# Patient Record
Sex: Female | Born: 1940 | Race: White | Hispanic: No
Health system: Midwestern US, Community
[De-identification: ages and names within clinical notes are randomized; demographics above are authoritative.]

## PROBLEM LIST (undated history)

## (undated) DIAGNOSIS — I10 Essential (primary) hypertension: Secondary | ICD-10-CM

## (undated) DIAGNOSIS — Z1231 Encounter for screening mammogram for malignant neoplasm of breast: Secondary | ICD-10-CM

## (undated) DIAGNOSIS — M5126 Other intervertebral disc displacement, lumbar region: Secondary | ICD-10-CM

## (undated) DIAGNOSIS — M797 Fibromyalgia: Secondary | ICD-10-CM

## (undated) DIAGNOSIS — R4781 Slurred speech: Secondary | ICD-10-CM

## (undated) DIAGNOSIS — M4802 Spinal stenosis, cervical region: Secondary | ICD-10-CM

## (undated) DIAGNOSIS — M5412 Radiculopathy, cervical region: Secondary | ICD-10-CM

## (undated) DIAGNOSIS — J45909 Unspecified asthma, uncomplicated: Secondary | ICD-10-CM

## (undated) DIAGNOSIS — D509 Iron deficiency anemia, unspecified: Secondary | ICD-10-CM

## (undated) DIAGNOSIS — G629 Polyneuropathy, unspecified: Secondary | ICD-10-CM

## (undated) DIAGNOSIS — N83209 Unspecified ovarian cyst, unspecified side: Secondary | ICD-10-CM

## (undated) DIAGNOSIS — M8580 Other specified disorders of bone density and structure, unspecified site: Secondary | ICD-10-CM

## (undated) DIAGNOSIS — E785 Hyperlipidemia, unspecified: Secondary | ICD-10-CM

## (undated) DIAGNOSIS — K219 Gastro-esophageal reflux disease without esophagitis: Secondary | ICD-10-CM

## (undated) DIAGNOSIS — L97925 Non-pressure chronic ulcer of unspecified part of left lower leg with muscle involvement without evidence of necrosis: Secondary | ICD-10-CM

## (undated) DIAGNOSIS — E1140 Type 2 diabetes mellitus with diabetic neuropathy, unspecified: Principal | ICD-10-CM

## (undated) DIAGNOSIS — E119 Type 2 diabetes mellitus without complications: Principal | ICD-10-CM

## (undated) DIAGNOSIS — L97922 Non-pressure chronic ulcer of unspecified part of left lower leg with fat layer exposed: Principal | ICD-10-CM

## (undated) DIAGNOSIS — K2100 Gastro-esophageal reflux disease with esophagitis, without bleeding: Principal | ICD-10-CM

## (undated) DIAGNOSIS — J9600 Acute respiratory failure, unspecified whether with hypoxia or hypercapnia: Principal | ICD-10-CM

## (undated) DIAGNOSIS — E1149 Type 2 diabetes mellitus with other diabetic neurological complication: Principal | ICD-10-CM

## (undated) HISTORY — DX: Iron deficiency anemia, unspecified: D50.9

## (undated) HISTORY — DX: Polyneuropathy, unspecified: G62.9

## (undated) HISTORY — DX: Hyperlipidemia, unspecified: E78.5

## (undated) HISTORY — DX: Unspecified ovarian cyst, unspecified side: N83.209

## (undated) HISTORY — PX: VAGINAL HYSTERECTOMY: SUR661

## (undated) HISTORY — DX: Essential (primary) hypertension: I10

## (undated) HISTORY — DX: Gastro-esophageal reflux disease without esophagitis: K21.9

## (undated) HISTORY — DX: Unspecified asthma, uncomplicated: J45.909

## (undated) HISTORY — DX: Other specified disorders of bone density and structure, unspecified site: M85.80

---

## 2003-04-07 ENCOUNTER — Emergency Department (HOSPITAL_COMMUNITY): Admission: EM | Admit: 2003-04-07 | Discharge: 2003-04-07 | Payer: Self-pay | Admitting: Emergency Medicine

## 2003-04-07 ENCOUNTER — Encounter: Payer: Self-pay | Admitting: Emergency Medicine

## 2003-07-16 ENCOUNTER — Encounter: Payer: Self-pay | Admitting: Family Medicine

## 2003-07-16 ENCOUNTER — Encounter: Admission: RE | Admit: 2003-07-16 | Discharge: 2003-07-16 | Payer: Self-pay | Admitting: Internal Medicine

## 2003-07-16 ENCOUNTER — Encounter: Payer: Self-pay | Admitting: Internal Medicine

## 2003-10-10 ENCOUNTER — Encounter: Payer: Self-pay | Admitting: Internal Medicine

## 2003-10-10 ENCOUNTER — Encounter: Payer: Self-pay | Admitting: Family Medicine

## 2003-10-10 ENCOUNTER — Encounter: Admission: RE | Admit: 2003-10-10 | Discharge: 2003-10-10 | Payer: Self-pay | Admitting: Internal Medicine

## 2003-11-27 ENCOUNTER — Encounter
Admission: RE | Admit: 2003-11-27 | Discharge: 2004-02-25 | Payer: Self-pay | Admitting: Physical Medicine and Rehabilitation

## 2003-12-12 ENCOUNTER — Ambulatory Visit (HOSPITAL_COMMUNITY)
Admission: RE | Admit: 2003-12-12 | Discharge: 2003-12-12 | Payer: Self-pay | Admitting: Physical Medicine and Rehabilitation

## 2003-12-25 ENCOUNTER — Other Ambulatory Visit: Admission: RE | Admit: 2003-12-25 | Discharge: 2003-12-25 | Payer: Self-pay | Admitting: Obstetrics and Gynecology

## 2004-03-04 ENCOUNTER — Encounter
Admission: RE | Admit: 2004-03-04 | Discharge: 2004-06-02 | Payer: Self-pay | Admitting: Physical Medicine and Rehabilitation

## 2004-03-13 ENCOUNTER — Encounter
Admission: RE | Admit: 2004-03-13 | Discharge: 2004-03-13 | Payer: Self-pay | Admitting: Physical Medicine and Rehabilitation

## 2004-03-13 ENCOUNTER — Encounter: Payer: Self-pay | Admitting: Family Medicine

## 2004-06-12 ENCOUNTER — Encounter: Admission: RE | Admit: 2004-06-12 | Discharge: 2004-06-12 | Payer: Self-pay | Admitting: Family Medicine

## 2004-07-08 ENCOUNTER — Encounter: Admission: RE | Admit: 2004-07-08 | Discharge: 2004-07-08 | Payer: Self-pay | Admitting: Family Medicine

## 2004-07-09 ENCOUNTER — Emergency Department (HOSPITAL_COMMUNITY): Admission: EM | Admit: 2004-07-09 | Discharge: 2004-07-09 | Payer: Self-pay | Admitting: Emergency Medicine

## 2004-07-15 ENCOUNTER — Encounter: Admission: RE | Admit: 2004-07-15 | Discharge: 2004-08-22 | Payer: Self-pay | Admitting: Family Medicine

## 2004-07-17 ENCOUNTER — Encounter: Admission: RE | Admit: 2004-07-17 | Discharge: 2004-07-17 | Payer: Self-pay | Admitting: Family Medicine

## 2004-11-08 ENCOUNTER — Emergency Department (HOSPITAL_COMMUNITY): Admission: EM | Admit: 2004-11-08 | Discharge: 2004-11-08 | Payer: Self-pay | Admitting: Emergency Medicine

## 2004-11-11 ENCOUNTER — Ambulatory Visit: Payer: Self-pay | Admitting: Internal Medicine

## 2004-11-13 ENCOUNTER — Ambulatory Visit: Payer: Self-pay | Admitting: Family Medicine

## 2004-11-14 ENCOUNTER — Ambulatory Visit: Payer: Self-pay | Admitting: Internal Medicine

## 2004-11-29 ENCOUNTER — Encounter: Admission: RE | Admit: 2004-11-29 | Discharge: 2004-11-29 | Payer: Self-pay | Admitting: Internal Medicine

## 2004-11-29 ENCOUNTER — Ambulatory Visit: Payer: Self-pay | Admitting: Internal Medicine

## 2004-12-06 ENCOUNTER — Ambulatory Visit: Payer: Self-pay | Admitting: Family Medicine

## 2004-12-10 ENCOUNTER — Ambulatory Visit: Payer: Self-pay | Admitting: Family Medicine

## 2005-01-15 ENCOUNTER — Encounter: Admission: RE | Admit: 2005-01-15 | Discharge: 2005-01-15 | Payer: Self-pay | Admitting: Obstetrics and Gynecology

## 2005-01-31 ENCOUNTER — Ambulatory Visit: Payer: Self-pay | Admitting: Family Medicine

## 2005-02-12 ENCOUNTER — Encounter (HOSPITAL_BASED_OUTPATIENT_CLINIC_OR_DEPARTMENT_OTHER): Admission: RE | Admit: 2005-02-12 | Discharge: 2005-03-19 | Payer: Self-pay | Admitting: Internal Medicine

## 2005-02-18 ENCOUNTER — Ambulatory Visit: Payer: Self-pay | Admitting: Family Medicine

## 2005-04-16 ENCOUNTER — Ambulatory Visit: Payer: Self-pay | Admitting: Family Medicine

## 2005-05-08 ENCOUNTER — Ambulatory Visit: Payer: Self-pay | Admitting: Family Medicine

## 2005-07-04 ENCOUNTER — Ambulatory Visit: Payer: Self-pay | Admitting: Family Medicine

## 2005-08-27 ENCOUNTER — Emergency Department (HOSPITAL_COMMUNITY): Admission: EM | Admit: 2005-08-27 | Discharge: 2005-08-27 | Payer: Self-pay | Admitting: Emergency Medicine

## 2005-09-05 ENCOUNTER — Ambulatory Visit: Payer: Self-pay | Admitting: Family Medicine

## 2005-09-11 ENCOUNTER — Ambulatory Visit: Payer: Self-pay | Admitting: Family Medicine

## 2005-09-15 ENCOUNTER — Ambulatory Visit: Payer: Self-pay | Admitting: Family Medicine

## 2005-09-19 ENCOUNTER — Ambulatory Visit: Payer: Self-pay | Admitting: Family Medicine

## 2005-09-25 ENCOUNTER — Ambulatory Visit: Payer: Self-pay | Admitting: Cardiology

## 2005-10-06 ENCOUNTER — Ambulatory Visit: Payer: Self-pay | Admitting: Gastroenterology

## 2005-10-14 ENCOUNTER — Ambulatory Visit: Payer: Self-pay | Admitting: Gastroenterology

## 2005-10-14 ENCOUNTER — Ambulatory Visit (HOSPITAL_COMMUNITY): Admission: RE | Admit: 2005-10-14 | Discharge: 2005-10-14 | Payer: Self-pay | Admitting: Gastroenterology

## 2005-10-23 ENCOUNTER — Ambulatory Visit: Payer: Self-pay | Admitting: Family Medicine

## 2005-11-03 ENCOUNTER — Ambulatory Visit: Payer: Self-pay | Admitting: Family Medicine

## 2005-11-10 ENCOUNTER — Ambulatory Visit: Payer: Self-pay | Admitting: Gastroenterology

## 2005-11-13 ENCOUNTER — Ambulatory Visit: Payer: Self-pay | Admitting: Family Medicine

## 2005-11-24 ENCOUNTER — Ambulatory Visit: Payer: Self-pay | Admitting: Family Medicine

## 2005-11-25 ENCOUNTER — Encounter: Admission: RE | Admit: 2005-11-25 | Discharge: 2005-11-25 | Payer: Self-pay | Admitting: Family Medicine

## 2005-11-27 ENCOUNTER — Encounter: Admission: RE | Admit: 2005-11-27 | Discharge: 2006-01-06 | Payer: Self-pay | Admitting: Family Medicine

## 2005-12-02 ENCOUNTER — Ambulatory Visit: Payer: Self-pay | Admitting: Gastroenterology

## 2005-12-02 ENCOUNTER — Ambulatory Visit: Payer: Self-pay | Admitting: Family Medicine

## 2006-01-07 ENCOUNTER — Encounter: Admission: RE | Admit: 2006-01-07 | Discharge: 2006-03-05 | Payer: Self-pay | Admitting: Family Medicine

## 2006-01-16 ENCOUNTER — Encounter: Admission: RE | Admit: 2006-01-16 | Discharge: 2006-01-16 | Payer: Self-pay | Admitting: Family Medicine

## 2006-01-22 ENCOUNTER — Ambulatory Visit: Payer: Self-pay | Admitting: Family Medicine

## 2006-01-30 ENCOUNTER — Other Ambulatory Visit: Admission: RE | Admit: 2006-01-30 | Discharge: 2006-01-30 | Payer: Self-pay | Admitting: Obstetrics and Gynecology

## 2006-06-29 ENCOUNTER — Ambulatory Visit: Payer: Self-pay | Admitting: Family Medicine

## 2006-07-16 ENCOUNTER — Ambulatory Visit: Payer: Self-pay | Admitting: Family Medicine

## 2006-09-29 ENCOUNTER — Ambulatory Visit: Payer: Self-pay | Admitting: Family Medicine

## 2006-09-30 ENCOUNTER — Ambulatory Visit: Payer: Self-pay | Admitting: Family Medicine

## 2006-10-12 ENCOUNTER — Ambulatory Visit: Payer: Self-pay | Admitting: Family Medicine

## 2006-10-14 ENCOUNTER — Encounter: Admission: RE | Admit: 2006-10-14 | Discharge: 2006-10-14 | Payer: Self-pay | Admitting: Orthopedic Surgery

## 2006-12-08 ENCOUNTER — Ambulatory Visit: Payer: Self-pay | Admitting: Family Medicine

## 2007-01-29 ENCOUNTER — Ambulatory Visit: Payer: Self-pay | Admitting: Family Medicine

## 2007-03-10 ENCOUNTER — Encounter: Admission: RE | Admit: 2007-03-10 | Discharge: 2007-03-10 | Payer: Self-pay | Admitting: Family Medicine

## 2007-03-23 ENCOUNTER — Encounter: Payer: Self-pay | Admitting: Family Medicine

## 2007-03-23 ENCOUNTER — Encounter
Admission: RE | Admit: 2007-03-23 | Discharge: 2007-03-23 | Payer: Self-pay | Admitting: Physical Medicine and Rehabilitation

## 2007-04-01 ENCOUNTER — Ambulatory Visit: Payer: Self-pay | Admitting: Family Medicine

## 2007-04-21 ENCOUNTER — Ambulatory Visit: Payer: Self-pay | Admitting: Family Medicine

## 2007-05-07 ENCOUNTER — Ambulatory Visit: Payer: Self-pay | Admitting: Family Medicine

## 2007-05-12 ENCOUNTER — Encounter: Payer: Self-pay | Admitting: Family Medicine

## 2007-05-13 ENCOUNTER — Ambulatory Visit: Payer: Self-pay | Admitting: Family Medicine

## 2007-05-13 LAB — CONVERTED CEMR LAB
ALT: 11 units/L (ref 0–40)
AST: 13 units/L (ref 0–37)
Alkaline Phosphatase: 56 units/L (ref 39–117)
Total Protein: 6.4 g/dL (ref 6.0–8.3)

## 2007-05-18 DIAGNOSIS — E785 Hyperlipidemia, unspecified: Secondary | ICD-10-CM

## 2007-05-18 DIAGNOSIS — Z8719 Personal history of other diseases of the digestive system: Secondary | ICD-10-CM

## 2007-05-18 DIAGNOSIS — J309 Allergic rhinitis, unspecified: Secondary | ICD-10-CM | POA: Insufficient documentation

## 2007-05-18 DIAGNOSIS — M949 Disorder of cartilage, unspecified: Secondary | ICD-10-CM

## 2007-05-18 DIAGNOSIS — E1140 Type 2 diabetes mellitus with diabetic neuropathy, unspecified: Secondary | ICD-10-CM | POA: Insufficient documentation

## 2007-05-18 DIAGNOSIS — Z9079 Acquired absence of other genital organ(s): Secondary | ICD-10-CM | POA: Insufficient documentation

## 2007-05-18 DIAGNOSIS — N750 Cyst of Bartholin's gland: Secondary | ICD-10-CM | POA: Insufficient documentation

## 2007-05-18 DIAGNOSIS — M899 Disorder of bone, unspecified: Secondary | ICD-10-CM | POA: Insufficient documentation

## 2007-05-18 DIAGNOSIS — I10 Essential (primary) hypertension: Secondary | ICD-10-CM | POA: Insufficient documentation

## 2007-05-26 ENCOUNTER — Encounter: Payer: Self-pay | Admitting: Family Medicine

## 2007-07-14 ENCOUNTER — Encounter: Payer: Self-pay | Admitting: Family Medicine

## 2007-08-23 ENCOUNTER — Encounter: Payer: Self-pay | Admitting: Family Medicine

## 2007-08-25 ENCOUNTER — Telehealth (INDEPENDENT_AMBULATORY_CARE_PROVIDER_SITE_OTHER): Payer: Self-pay | Admitting: *Deleted

## 2007-09-14 ENCOUNTER — Encounter: Payer: Self-pay | Admitting: Family Medicine

## 2007-09-15 ENCOUNTER — Encounter: Admission: RE | Admit: 2007-09-15 | Discharge: 2007-09-15 | Payer: Self-pay | Admitting: Orthopedic Surgery

## 2007-09-27 ENCOUNTER — Encounter: Payer: Self-pay | Admitting: Family Medicine

## 2007-09-28 ENCOUNTER — Telehealth: Payer: Self-pay | Admitting: Family Medicine

## 2007-10-11 ENCOUNTER — Telehealth (INDEPENDENT_AMBULATORY_CARE_PROVIDER_SITE_OTHER): Payer: Self-pay | Admitting: *Deleted

## 2007-10-12 ENCOUNTER — Ambulatory Visit: Payer: Self-pay | Admitting: Family Medicine

## 2007-10-12 DIAGNOSIS — R109 Unspecified abdominal pain: Secondary | ICD-10-CM | POA: Insufficient documentation

## 2007-10-13 ENCOUNTER — Encounter: Admission: RE | Admit: 2007-10-13 | Discharge: 2007-10-13 | Payer: Self-pay | Admitting: Family Medicine

## 2007-10-15 ENCOUNTER — Telehealth: Payer: Self-pay | Admitting: Family Medicine

## 2007-10-19 ENCOUNTER — Telehealth: Payer: Self-pay | Admitting: Family Medicine

## 2007-11-02 ENCOUNTER — Ambulatory Visit: Payer: Self-pay | Admitting: Gastroenterology

## 2007-11-02 LAB — CONVERTED CEMR LAB
AST: 16 units/L (ref 0–37)
Albumin: 3.6 g/dL (ref 3.5–5.2)
BUN: 10 mg/dL (ref 6–23)
GFR calc non Af Amer: 107 mL/min
Potassium: 4.1 meq/L (ref 3.5–5.1)

## 2007-11-03 ENCOUNTER — Ambulatory Visit: Payer: Self-pay | Admitting: Family Medicine

## 2007-11-04 ENCOUNTER — Encounter (INDEPENDENT_AMBULATORY_CARE_PROVIDER_SITE_OTHER): Payer: Self-pay | Admitting: *Deleted

## 2007-11-09 ENCOUNTER — Encounter: Payer: Self-pay | Admitting: Family Medicine

## 2007-11-10 ENCOUNTER — Ambulatory Visit: Payer: Self-pay | Admitting: Family Medicine

## 2007-11-15 ENCOUNTER — Telehealth: Payer: Self-pay | Admitting: Family Medicine

## 2007-11-30 ENCOUNTER — Telehealth: Payer: Self-pay | Admitting: Family Medicine

## 2007-12-08 ENCOUNTER — Encounter: Payer: Self-pay | Admitting: Family Medicine

## 2007-12-27 ENCOUNTER — Telehealth (INDEPENDENT_AMBULATORY_CARE_PROVIDER_SITE_OTHER): Payer: Self-pay | Admitting: *Deleted

## 2007-12-29 ENCOUNTER — Telehealth (INDEPENDENT_AMBULATORY_CARE_PROVIDER_SITE_OTHER): Payer: Self-pay | Admitting: *Deleted

## 2008-01-04 ENCOUNTER — Telehealth (INDEPENDENT_AMBULATORY_CARE_PROVIDER_SITE_OTHER): Payer: Self-pay | Admitting: *Deleted

## 2008-01-04 DIAGNOSIS — M549 Dorsalgia, unspecified: Secondary | ICD-10-CM | POA: Insufficient documentation

## 2008-01-18 ENCOUNTER — Encounter: Payer: Self-pay | Admitting: Family Medicine

## 2008-01-25 ENCOUNTER — Telehealth: Payer: Self-pay | Admitting: Family Medicine

## 2008-01-25 ENCOUNTER — Ambulatory Visit: Payer: Self-pay | Admitting: Family Medicine

## 2008-01-25 DIAGNOSIS — D508 Other iron deficiency anemias: Secondary | ICD-10-CM

## 2008-01-27 ENCOUNTER — Telehealth (INDEPENDENT_AMBULATORY_CARE_PROVIDER_SITE_OTHER): Payer: Self-pay | Admitting: *Deleted

## 2008-01-28 ENCOUNTER — Telehealth (INDEPENDENT_AMBULATORY_CARE_PROVIDER_SITE_OTHER): Payer: Self-pay | Admitting: *Deleted

## 2008-02-01 DIAGNOSIS — D72819 Decreased white blood cell count, unspecified: Secondary | ICD-10-CM | POA: Insufficient documentation

## 2008-02-01 LAB — CONVERTED CEMR LAB
Basophils Absolute: 0 10*3/uL (ref 0.0–0.1)
Basophils Relative: 0.7 % (ref 0.0–1.0)
HCT: 36.4 % (ref 36.0–46.0)
Lymphocytes Relative: 69.2 % — ABNORMAL HIGH (ref 12.0–46.0)
MCV: 83.1 fL (ref 78.0–100.0)
Monocytes Absolute: 0.5 10*3/uL (ref 0.2–0.7)
Neutro Abs: 0.5 10*3/uL — ABNORMAL LOW (ref 1.4–7.7)
Platelets: 280 10*3/uL (ref 150–400)
RDW: 16.1 % — ABNORMAL HIGH (ref 11.5–14.6)

## 2008-02-07 ENCOUNTER — Ambulatory Visit: Payer: Self-pay | Admitting: Hematology and Oncology

## 2008-02-18 DIAGNOSIS — M129 Arthropathy, unspecified: Secondary | ICD-10-CM | POA: Insufficient documentation

## 2008-02-18 DIAGNOSIS — R519 Headache, unspecified: Secondary | ICD-10-CM | POA: Insufficient documentation

## 2008-02-18 DIAGNOSIS — F329 Major depressive disorder, single episode, unspecified: Secondary | ICD-10-CM

## 2008-02-18 DIAGNOSIS — J45909 Unspecified asthma, uncomplicated: Secondary | ICD-10-CM | POA: Insufficient documentation

## 2008-02-18 DIAGNOSIS — R5383 Other fatigue: Secondary | ICD-10-CM

## 2008-02-18 DIAGNOSIS — R599 Enlarged lymph nodes, unspecified: Secondary | ICD-10-CM | POA: Insufficient documentation

## 2008-02-18 DIAGNOSIS — K219 Gastro-esophageal reflux disease without esophagitis: Secondary | ICD-10-CM

## 2008-02-18 DIAGNOSIS — R51 Headache: Secondary | ICD-10-CM

## 2008-02-18 DIAGNOSIS — F3289 Other specified depressive episodes: Secondary | ICD-10-CM | POA: Insufficient documentation

## 2008-02-18 DIAGNOSIS — R609 Edema, unspecified: Secondary | ICD-10-CM

## 2008-02-18 DIAGNOSIS — R5381 Other malaise: Secondary | ICD-10-CM

## 2008-02-24 ENCOUNTER — Telehealth (INDEPENDENT_AMBULATORY_CARE_PROVIDER_SITE_OTHER): Payer: Self-pay | Admitting: *Deleted

## 2008-03-02 ENCOUNTER — Encounter: Payer: Self-pay | Admitting: Family Medicine

## 2008-03-03 ENCOUNTER — Ambulatory Visit: Payer: Self-pay | Admitting: Family Medicine

## 2008-03-03 DIAGNOSIS — D7289 Other specified disorders of white blood cells: Secondary | ICD-10-CM

## 2008-03-03 LAB — CONVERTED CEMR LAB: Uric Acid, Serum: 3.9 mg/dL (ref 2.4–7.0)

## 2008-03-06 ENCOUNTER — Telehealth (INDEPENDENT_AMBULATORY_CARE_PROVIDER_SITE_OTHER): Payer: Self-pay | Admitting: *Deleted

## 2008-03-06 LAB — CONVERTED CEMR LAB: LDH: 160 units/L (ref 94–250)

## 2008-03-09 ENCOUNTER — Telehealth: Payer: Self-pay | Admitting: Family Medicine

## 2008-03-10 ENCOUNTER — Encounter: Payer: Self-pay | Admitting: Family Medicine

## 2008-03-10 ENCOUNTER — Encounter: Payer: Self-pay | Admitting: Hematology and Oncology

## 2008-03-10 ENCOUNTER — Other Ambulatory Visit: Admission: RE | Admit: 2008-03-10 | Discharge: 2008-03-10 | Payer: Self-pay | Admitting: Hematology and Oncology

## 2008-03-10 LAB — URINALYSIS, MICROSCOPIC - CHCC
Bilirubin (Urine): NEGATIVE
Ketones: NEGATIVE mg/dL
Protein: NEGATIVE mg/dL
Specific Gravity, Urine: 1.01 (ref 1.003–1.035)
pH: 7 (ref 4.6–8.0)

## 2008-03-10 LAB — CBC WITH DIFFERENTIAL/PLATELET
BASO%: 0.1 % (ref 0.0–2.0)
Basophils Absolute: 0 10*3/uL (ref 0.0–0.1)
HCT: 37.6 % (ref 34.8–46.6)
HGB: 12.4 g/dL (ref 11.6–15.9)
LYMPH%: 51.4 % — ABNORMAL HIGH (ref 14.0–48.0)
MCHC: 32.8 g/dL (ref 32.0–36.0)
MONO#: 0.3 10*3/uL (ref 0.1–0.9)
NEUT%: 39.3 % — ABNORMAL LOW (ref 39.6–76.8)
Platelets: 296 10*3/uL (ref 145–400)
WBC: 4.3 10*3/uL (ref 3.9–10.0)
lymph#: 2.2 10*3/uL (ref 0.9–3.3)

## 2008-03-10 LAB — MORPHOLOGY: PLT EST: ADEQUATE

## 2008-03-10 LAB — COMPREHENSIVE METABOLIC PANEL
ALT: 15 U/L (ref 0–35)
AST: 18 U/L (ref 0–37)
Alkaline Phosphatase: 67 U/L (ref 39–117)
Creatinine, Ser: 0.69 mg/dL (ref 0.40–1.20)
Sodium: 141 mEq/L (ref 135–145)
Total Bilirubin: 0.3 mg/dL (ref 0.3–1.2)
Total Protein: 6.6 g/dL (ref 6.0–8.3)

## 2008-03-10 LAB — LACTATE DEHYDROGENASE: LDH: 190 U/L (ref 94–250)

## 2008-03-14 LAB — COMPREHENSIVE METABOLIC PANEL
ALT: 15 U/L (ref 0–35)
Albumin: 4 g/dL (ref 3.5–5.2)
Alkaline Phosphatase: 67 U/L (ref 39–117)
CO2: 26 mEq/L (ref 19–32)
Glucose, Bld: 94 mg/dL (ref 70–99)
Potassium: 4.7 mEq/L (ref 3.5–5.3)
Sodium: 140 mEq/L (ref 135–145)
Total Bilirubin: 0.2 mg/dL — ABNORMAL LOW (ref 0.3–1.2)
Total Protein: 6.3 g/dL (ref 6.0–8.3)

## 2008-03-14 LAB — PROTEIN ELECTROPHORESIS, SERUM, WITH REFLEX
Beta 2: 5.2 % (ref 3.2–6.5)
Beta Globulin: 6.3 % (ref 4.7–7.2)

## 2008-03-14 LAB — DIRECT ANTIGLOBULIN TEST (NOT AT ARMC)
DAT (Complement): NEGATIVE
DAT IgG: NEGATIVE

## 2008-03-14 LAB — HAPTOGLOBIN: Haptoglobin: 249 mg/dL — ABNORMAL HIGH (ref 16–200)

## 2008-03-14 LAB — FERRITIN: Ferritin: 34 ng/mL (ref 10–291)

## 2008-03-14 LAB — LACTATE DEHYDROGENASE: LDH: 182 U/L (ref 94–250)

## 2008-03-14 LAB — IRON AND TIBC: %SAT: 18 % — ABNORMAL LOW (ref 20–55)

## 2008-03-17 ENCOUNTER — Ambulatory Visit: Payer: Self-pay | Admitting: Hematology and Oncology

## 2008-03-21 ENCOUNTER — Encounter: Payer: Self-pay | Admitting: Family Medicine

## 2008-04-04 ENCOUNTER — Encounter (INDEPENDENT_AMBULATORY_CARE_PROVIDER_SITE_OTHER): Payer: Self-pay | Admitting: *Deleted

## 2008-04-06 ENCOUNTER — Ambulatory Visit: Payer: Self-pay | Admitting: Family Medicine

## 2008-04-06 ENCOUNTER — Telehealth (INDEPENDENT_AMBULATORY_CARE_PROVIDER_SITE_OTHER): Payer: Self-pay | Admitting: *Deleted

## 2008-04-10 ENCOUNTER — Telehealth (INDEPENDENT_AMBULATORY_CARE_PROVIDER_SITE_OTHER): Payer: Self-pay | Admitting: *Deleted

## 2008-04-12 ENCOUNTER — Encounter (INDEPENDENT_AMBULATORY_CARE_PROVIDER_SITE_OTHER): Payer: Self-pay | Admitting: *Deleted

## 2008-04-24 ENCOUNTER — Ambulatory Visit: Payer: Self-pay | Admitting: Internal Medicine

## 2008-04-24 DIAGNOSIS — M545 Low back pain: Secondary | ICD-10-CM

## 2008-04-24 DIAGNOSIS — G609 Hereditary and idiopathic neuropathy, unspecified: Secondary | ICD-10-CM | POA: Insufficient documentation

## 2008-04-26 ENCOUNTER — Encounter (INDEPENDENT_AMBULATORY_CARE_PROVIDER_SITE_OTHER): Payer: Self-pay | Admitting: *Deleted

## 2008-04-28 ENCOUNTER — Telehealth (INDEPENDENT_AMBULATORY_CARE_PROVIDER_SITE_OTHER): Payer: Self-pay | Admitting: *Deleted

## 2008-05-02 ENCOUNTER — Encounter: Payer: Self-pay | Admitting: Internal Medicine

## 2008-05-08 ENCOUNTER — Telehealth (INDEPENDENT_AMBULATORY_CARE_PROVIDER_SITE_OTHER): Payer: Self-pay | Admitting: *Deleted

## 2008-05-14 ENCOUNTER — Encounter: Payer: Self-pay | Admitting: Family Medicine

## 2008-05-14 ENCOUNTER — Encounter: Admission: RE | Admit: 2008-05-14 | Discharge: 2008-05-14 | Payer: Self-pay | Admitting: Orthopedic Surgery

## 2008-05-15 ENCOUNTER — Ambulatory Visit: Payer: Self-pay | Admitting: Internal Medicine

## 2008-05-15 DIAGNOSIS — R0989 Other specified symptoms and signs involving the circulatory and respiratory systems: Secondary | ICD-10-CM

## 2008-05-15 DIAGNOSIS — E559 Vitamin D deficiency, unspecified: Secondary | ICD-10-CM | POA: Insufficient documentation

## 2008-05-15 LAB — CONVERTED CEMR LAB
Basophils Absolute: 0 10*3/uL (ref 0.0–0.1)
Basophils Relative: 0.7 % (ref 0.0–1.0)
Eosinophils Absolute: 0.1 10*3/uL (ref 0.0–0.7)
Hemoglobin: 12.1 g/dL (ref 12.0–15.0)
Lymphocytes Relative: 45.8 % (ref 12.0–46.0)
MCHC: 32.5 g/dL (ref 30.0–36.0)
Monocytes Relative: 7.6 % (ref 3.0–12.0)
RBC: 4.38 M/uL (ref 3.87–5.11)
RDW: 13.8 % (ref 11.5–14.6)

## 2008-05-16 ENCOUNTER — Encounter (INDEPENDENT_AMBULATORY_CARE_PROVIDER_SITE_OTHER): Payer: Self-pay | Admitting: *Deleted

## 2008-05-16 ENCOUNTER — Encounter: Payer: Self-pay | Admitting: Internal Medicine

## 2008-05-18 ENCOUNTER — Encounter (INDEPENDENT_AMBULATORY_CARE_PROVIDER_SITE_OTHER): Payer: Self-pay | Admitting: *Deleted

## 2008-05-23 ENCOUNTER — Encounter: Payer: Self-pay | Admitting: Internal Medicine

## 2008-06-05 ENCOUNTER — Telehealth: Payer: Self-pay | Admitting: Gastroenterology

## 2008-06-15 ENCOUNTER — Telehealth (INDEPENDENT_AMBULATORY_CARE_PROVIDER_SITE_OTHER): Payer: Self-pay | Admitting: *Deleted

## 2008-06-21 ENCOUNTER — Encounter: Payer: Self-pay | Admitting: Family Medicine

## 2008-06-28 ENCOUNTER — Encounter: Payer: Self-pay | Admitting: Family Medicine

## 2008-07-05 ENCOUNTER — Telehealth (INDEPENDENT_AMBULATORY_CARE_PROVIDER_SITE_OTHER): Payer: Self-pay | Admitting: *Deleted

## 2008-07-20 ENCOUNTER — Telehealth (INDEPENDENT_AMBULATORY_CARE_PROVIDER_SITE_OTHER): Payer: Self-pay | Admitting: *Deleted

## 2008-07-27 ENCOUNTER — Ambulatory Visit: Payer: Self-pay | Admitting: Family Medicine

## 2008-07-27 ENCOUNTER — Telehealth (INDEPENDENT_AMBULATORY_CARE_PROVIDER_SITE_OTHER): Payer: Self-pay | Admitting: *Deleted

## 2008-08-22 ENCOUNTER — Encounter: Payer: Self-pay | Admitting: Family Medicine

## 2008-09-07 ENCOUNTER — Ambulatory Visit: Payer: Self-pay | Admitting: Family Medicine

## 2008-09-11 ENCOUNTER — Ambulatory Visit: Payer: Self-pay | Admitting: Family Medicine

## 2008-09-15 ENCOUNTER — Encounter (INDEPENDENT_AMBULATORY_CARE_PROVIDER_SITE_OTHER): Payer: Self-pay | Admitting: *Deleted

## 2008-09-21 ENCOUNTER — Ambulatory Visit: Payer: Self-pay | Admitting: Family Medicine

## 2008-09-22 ENCOUNTER — Encounter (INDEPENDENT_AMBULATORY_CARE_PROVIDER_SITE_OTHER): Payer: Self-pay | Admitting: *Deleted

## 2008-09-22 ENCOUNTER — Telehealth (INDEPENDENT_AMBULATORY_CARE_PROVIDER_SITE_OTHER): Payer: Self-pay | Admitting: *Deleted

## 2008-09-25 ENCOUNTER — Telehealth (INDEPENDENT_AMBULATORY_CARE_PROVIDER_SITE_OTHER): Payer: Self-pay | Admitting: *Deleted

## 2008-09-26 ENCOUNTER — Encounter: Payer: Self-pay | Admitting: Family Medicine

## 2008-09-26 ENCOUNTER — Telehealth (INDEPENDENT_AMBULATORY_CARE_PROVIDER_SITE_OTHER): Payer: Self-pay | Admitting: *Deleted

## 2008-10-03 ENCOUNTER — Telehealth (INDEPENDENT_AMBULATORY_CARE_PROVIDER_SITE_OTHER): Payer: Self-pay | Admitting: *Deleted

## 2008-10-13 ENCOUNTER — Encounter: Payer: Self-pay | Admitting: Family Medicine

## 2008-10-20 ENCOUNTER — Telehealth (INDEPENDENT_AMBULATORY_CARE_PROVIDER_SITE_OTHER): Payer: Self-pay | Admitting: *Deleted

## 2008-10-23 ENCOUNTER — Encounter: Payer: Self-pay | Admitting: Family Medicine

## 2008-10-23 ENCOUNTER — Encounter: Admission: RE | Admit: 2008-10-23 | Discharge: 2008-11-07 | Payer: Self-pay | Admitting: Family Medicine

## 2008-10-26 ENCOUNTER — Telehealth (INDEPENDENT_AMBULATORY_CARE_PROVIDER_SITE_OTHER): Payer: Self-pay | Admitting: *Deleted

## 2008-10-30 ENCOUNTER — Telehealth (INDEPENDENT_AMBULATORY_CARE_PROVIDER_SITE_OTHER): Payer: Self-pay | Admitting: *Deleted

## 2008-11-01 ENCOUNTER — Ambulatory Visit: Payer: Self-pay | Admitting: Family Medicine

## 2008-11-07 ENCOUNTER — Encounter (INDEPENDENT_AMBULATORY_CARE_PROVIDER_SITE_OTHER): Payer: Self-pay | Admitting: *Deleted

## 2008-11-21 ENCOUNTER — Telehealth: Payer: Self-pay | Admitting: Family Medicine

## 2008-11-27 ENCOUNTER — Encounter: Admission: RE | Admit: 2008-11-27 | Discharge: 2008-11-27 | Payer: Self-pay | Admitting: Family Medicine

## 2008-11-30 ENCOUNTER — Telehealth: Payer: Self-pay | Admitting: Gastroenterology

## 2008-12-04 ENCOUNTER — Ambulatory Visit: Payer: Self-pay | Admitting: Gastroenterology

## 2008-12-05 ENCOUNTER — Ambulatory Visit: Payer: Self-pay | Admitting: Family Medicine

## 2008-12-07 ENCOUNTER — Encounter
Admission: RE | Admit: 2008-12-07 | Discharge: 2008-12-07 | Payer: Self-pay | Admitting: Physical Medicine and Rehabilitation

## 2008-12-11 ENCOUNTER — Ambulatory Visit: Payer: Self-pay | Admitting: Family Medicine

## 2008-12-11 ENCOUNTER — Telehealth (INDEPENDENT_AMBULATORY_CARE_PROVIDER_SITE_OTHER): Payer: Self-pay | Admitting: *Deleted

## 2008-12-19 LAB — CONVERTED CEMR LAB
CO2: 29 meq/L (ref 19–32)
Chloride: 107 meq/L (ref 96–112)
Creatinine, Ser: 0.6 mg/dL (ref 0.4–1.2)
Glucose, Bld: 103 mg/dL — ABNORMAL HIGH (ref 70–99)
LDL Cholesterol: 130 mg/dL — ABNORMAL HIGH (ref 0–99)
Potassium: 4.4 meq/L (ref 3.5–5.1)
Sodium: 141 meq/L (ref 135–145)
Total CHOL/HDL Ratio: 4.6
Triglycerides: 131 mg/dL (ref 0–149)

## 2008-12-21 ENCOUNTER — Encounter: Payer: Self-pay | Admitting: Family Medicine

## 2008-12-21 ENCOUNTER — Telehealth: Payer: Self-pay | Admitting: Family Medicine

## 2008-12-26 ENCOUNTER — Ambulatory Visit: Payer: Self-pay | Admitting: Family Medicine

## 2008-12-26 DIAGNOSIS — L259 Unspecified contact dermatitis, unspecified cause: Secondary | ICD-10-CM | POA: Insufficient documentation

## 2009-01-05 ENCOUNTER — Ambulatory Visit: Payer: Self-pay | Admitting: Family Medicine

## 2009-01-05 DIAGNOSIS — N898 Other specified noninflammatory disorders of vagina: Secondary | ICD-10-CM | POA: Insufficient documentation

## 2009-01-05 LAB — CONVERTED CEMR LAB
Blood in Urine, dipstick: NEGATIVE
Ketones, urine, test strip: NEGATIVE
Nitrite: NEGATIVE
Protein, U semiquant: NEGATIVE
Specific Gravity, Urine: 1.01
Urobilinogen, UA: 0.2

## 2009-01-09 ENCOUNTER — Encounter: Payer: Self-pay | Admitting: Family Medicine

## 2009-01-12 ENCOUNTER — Encounter (INDEPENDENT_AMBULATORY_CARE_PROVIDER_SITE_OTHER): Payer: Self-pay | Admitting: *Deleted

## 2009-01-16 ENCOUNTER — Encounter: Admission: RE | Admit: 2009-01-16 | Discharge: 2009-01-16 | Payer: Self-pay | Admitting: Obstetrics and Gynecology

## 2009-01-16 ENCOUNTER — Telehealth: Payer: Self-pay | Admitting: Family Medicine

## 2009-01-30 ENCOUNTER — Telehealth (INDEPENDENT_AMBULATORY_CARE_PROVIDER_SITE_OTHER): Payer: Self-pay | Admitting: *Deleted

## 2009-02-20 ENCOUNTER — Encounter: Payer: Self-pay | Admitting: Family Medicine

## 2009-02-20 ENCOUNTER — Ambulatory Visit: Payer: Self-pay | Admitting: Family Medicine

## 2009-02-22 ENCOUNTER — Telehealth (INDEPENDENT_AMBULATORY_CARE_PROVIDER_SITE_OTHER): Payer: Self-pay | Admitting: *Deleted

## 2009-02-27 ENCOUNTER — Ambulatory Visit: Payer: Self-pay | Admitting: Family Medicine

## 2009-02-27 LAB — CONVERTED CEMR LAB
AST: 17 units/L (ref 0–37)
Bilirubin, Direct: 0.1 mg/dL (ref 0.0–0.3)
Calcium: 9 mg/dL (ref 8.4–10.5)
GFR calc Af Amer: 129 mL/min
HCT: 37.7 % (ref 36.0–46.0)
MCV: 83.5 fL (ref 78.0–100.0)
Monocytes Absolute: 0.5 10*3/uL (ref 0.1–1.0)
Monocytes Relative: 6.8 % (ref 3.0–12.0)
RDW: 13.7 % (ref 11.5–14.6)
Sed Rate: 15 mm/hr (ref 0–22)
Sodium: 137 meq/L (ref 135–145)
WBC: 6.8 10*3/uL (ref 4.5–10.5)

## 2009-02-28 ENCOUNTER — Encounter (INDEPENDENT_AMBULATORY_CARE_PROVIDER_SITE_OTHER): Payer: Self-pay | Admitting: *Deleted

## 2009-03-02 ENCOUNTER — Telehealth: Payer: Self-pay | Admitting: Family Medicine

## 2009-03-02 ENCOUNTER — Encounter: Payer: Self-pay | Admitting: Family Medicine

## 2009-03-06 ENCOUNTER — Encounter: Payer: Self-pay | Admitting: Family Medicine

## 2009-03-08 ENCOUNTER — Telehealth (INDEPENDENT_AMBULATORY_CARE_PROVIDER_SITE_OTHER): Payer: Self-pay | Admitting: *Deleted

## 2009-03-14 ENCOUNTER — Encounter: Payer: Self-pay | Admitting: Family Medicine

## 2009-03-15 ENCOUNTER — Ambulatory Visit: Payer: Self-pay | Admitting: Family Medicine

## 2009-03-24 ENCOUNTER — Encounter: Payer: Self-pay | Admitting: Family Medicine

## 2009-03-26 ENCOUNTER — Ambulatory Visit: Payer: Self-pay | Admitting: Family Medicine

## 2009-03-27 ENCOUNTER — Ambulatory Visit: Payer: Self-pay | Admitting: Family Medicine

## 2009-03-29 ENCOUNTER — Encounter: Payer: Self-pay | Admitting: Family Medicine

## 2009-03-29 ENCOUNTER — Encounter (INDEPENDENT_AMBULATORY_CARE_PROVIDER_SITE_OTHER): Payer: Self-pay | Admitting: *Deleted

## 2009-03-29 LAB — CONVERTED CEMR LAB
ALT: 17 units/L (ref 0–35)
AST: 15 units/L (ref 0–37)
Albumin: 3.2 g/dL — ABNORMAL LOW (ref 3.5–5.2)
Alkaline Phosphatase: 42 units/L (ref 39–117)
Bilirubin, Direct: 0 mg/dL (ref 0.0–0.3)
CO2: 31 meq/L (ref 19–32)
Chloride: 109 meq/L (ref 96–112)
Creatinine, Ser: 0.7 mg/dL (ref 0.4–1.2)
GFR calc non Af Amer: 107.9 mL/min (ref >60)
Glucose, Bld: 93 mg/dL (ref 70–99)
HDL: 49.8 mg/dL (ref >39.00)
Hgb A1c MFr Bld: 6.3 % (ref 4.6–6.5)
Sodium: 142 meq/L (ref 135–145)
Total Bilirubin: 0.7 mg/dL (ref 0.3–1.2)
Triglycerides: 99 mg/dL (ref 0.0–149.0)

## 2009-04-02 ENCOUNTER — Encounter (INDEPENDENT_AMBULATORY_CARE_PROVIDER_SITE_OTHER): Payer: Self-pay | Admitting: *Deleted

## 2009-04-19 ENCOUNTER — Telehealth (INDEPENDENT_AMBULATORY_CARE_PROVIDER_SITE_OTHER): Payer: Self-pay | Admitting: *Deleted

## 2009-05-18 ENCOUNTER — Telehealth (INDEPENDENT_AMBULATORY_CARE_PROVIDER_SITE_OTHER): Payer: Self-pay | Admitting: *Deleted

## 2009-05-21 ENCOUNTER — Telehealth (INDEPENDENT_AMBULATORY_CARE_PROVIDER_SITE_OTHER): Payer: Self-pay | Admitting: *Deleted

## 2009-06-08 ENCOUNTER — Encounter: Payer: Self-pay | Admitting: Family Medicine

## 2009-06-20 ENCOUNTER — Ambulatory Visit: Payer: Self-pay | Admitting: Family Medicine

## 2009-06-20 DIAGNOSIS — M25569 Pain in unspecified knee: Secondary | ICD-10-CM

## 2009-06-27 ENCOUNTER — Encounter: Admission: RE | Admit: 2009-06-27 | Discharge: 2009-09-07 | Payer: Self-pay | Admitting: Family Medicine

## 2009-07-16 ENCOUNTER — Encounter: Payer: Self-pay | Admitting: Family Medicine

## 2009-07-19 ENCOUNTER — Telehealth: Payer: Self-pay | Admitting: Family Medicine

## 2009-07-19 ENCOUNTER — Telehealth (INDEPENDENT_AMBULATORY_CARE_PROVIDER_SITE_OTHER): Payer: Self-pay | Admitting: *Deleted

## 2009-07-26 ENCOUNTER — Telehealth (INDEPENDENT_AMBULATORY_CARE_PROVIDER_SITE_OTHER): Payer: Self-pay | Admitting: *Deleted

## 2009-08-02 ENCOUNTER — Ambulatory Visit: Payer: Self-pay | Admitting: Family Medicine

## 2009-08-07 ENCOUNTER — Telehealth (INDEPENDENT_AMBULATORY_CARE_PROVIDER_SITE_OTHER): Payer: Self-pay | Admitting: *Deleted

## 2009-08-15 ENCOUNTER — Telehealth (INDEPENDENT_AMBULATORY_CARE_PROVIDER_SITE_OTHER): Payer: Self-pay | Admitting: *Deleted

## 2009-08-15 ENCOUNTER — Ambulatory Visit: Payer: Self-pay | Admitting: Family Medicine

## 2009-08-16 ENCOUNTER — Telehealth (INDEPENDENT_AMBULATORY_CARE_PROVIDER_SITE_OTHER): Payer: Self-pay | Admitting: *Deleted

## 2009-08-17 ENCOUNTER — Encounter: Payer: Self-pay | Admitting: Family Medicine

## 2009-08-21 ENCOUNTER — Encounter: Payer: Self-pay | Admitting: Family Medicine

## 2009-08-23 ENCOUNTER — Ambulatory Visit: Payer: Self-pay | Admitting: Family Medicine

## 2009-08-29 ENCOUNTER — Encounter: Payer: Self-pay | Admitting: Family Medicine

## 2009-09-04 ENCOUNTER — Encounter: Payer: Self-pay | Admitting: Family Medicine

## 2009-09-06 ENCOUNTER — Encounter: Payer: Self-pay | Admitting: Family Medicine

## 2009-09-06 ENCOUNTER — Telehealth (INDEPENDENT_AMBULATORY_CARE_PROVIDER_SITE_OTHER): Payer: Self-pay | Admitting: *Deleted

## 2009-09-07 ENCOUNTER — Encounter: Payer: Self-pay | Admitting: Family Medicine

## 2009-09-10 ENCOUNTER — Encounter: Payer: Self-pay | Admitting: Family Medicine

## 2009-09-11 ENCOUNTER — Encounter: Payer: Self-pay | Admitting: Family Medicine

## 2009-09-11 ENCOUNTER — Encounter: Payer: Self-pay | Admitting: Gastroenterology

## 2009-09-17 ENCOUNTER — Encounter: Payer: Self-pay | Admitting: Gastroenterology

## 2009-09-17 ENCOUNTER — Telehealth: Payer: Self-pay | Admitting: Family Medicine

## 2009-09-17 ENCOUNTER — Encounter: Payer: Self-pay | Admitting: Family Medicine

## 2009-09-20 ENCOUNTER — Ambulatory Visit: Payer: Self-pay | Admitting: Family Medicine

## 2009-09-20 ENCOUNTER — Telehealth: Payer: Self-pay | Admitting: Gastroenterology

## 2009-09-20 DIAGNOSIS — K7689 Other specified diseases of liver: Secondary | ICD-10-CM

## 2009-09-21 ENCOUNTER — Encounter: Payer: Self-pay | Admitting: Family Medicine

## 2009-09-21 ENCOUNTER — Ambulatory Visit: Payer: Self-pay | Admitting: Gastroenterology

## 2009-09-21 LAB — CONVERTED CEMR LAB
ALT: 16 units/L (ref 0–35)
AST: 14 units/L (ref 0–37)
Albumin: 3.5 g/dL (ref 3.5–5.2)
Alkaline Phosphatase: 53 units/L (ref 39–117)
Basophils Relative: 0.6 % (ref 0.0–3.0)
Calcium: 9.6 mg/dL (ref 8.4–10.5)
Creatinine, Ser: 0.6 mg/dL (ref 0.4–1.2)
Eosinophils Relative: 3 % (ref 0.0–5.0)
Lymphocytes Relative: 34.7 % (ref 12.0–46.0)
Lymphs Abs: 2.1 10*3/uL (ref 0.7–4.0)
Total Protein: 6.5 g/dL (ref 6.0–8.3)
WBC: 6 10*3/uL (ref 4.5–10.5)

## 2009-09-26 ENCOUNTER — Telehealth: Payer: Self-pay | Admitting: Gastroenterology

## 2009-09-27 ENCOUNTER — Encounter: Payer: Self-pay | Admitting: Gastroenterology

## 2009-09-27 ENCOUNTER — Ambulatory Visit (HOSPITAL_COMMUNITY): Admission: RE | Admit: 2009-09-27 | Discharge: 2009-09-27 | Payer: Self-pay | Admitting: Gastroenterology

## 2009-10-01 ENCOUNTER — Telehealth: Payer: Self-pay | Admitting: Gastroenterology

## 2009-10-03 ENCOUNTER — Encounter: Payer: Self-pay | Admitting: Family Medicine

## 2009-10-12 ENCOUNTER — Encounter: Payer: Self-pay | Admitting: Family Medicine

## 2009-10-17 ENCOUNTER — Telehealth (INDEPENDENT_AMBULATORY_CARE_PROVIDER_SITE_OTHER): Payer: Self-pay | Admitting: *Deleted

## 2009-10-22 ENCOUNTER — Encounter: Payer: Self-pay | Admitting: Family Medicine

## 2009-10-29 ENCOUNTER — Ambulatory Visit: Payer: Self-pay | Admitting: Gastroenterology

## 2009-11-02 ENCOUNTER — Telehealth: Payer: Self-pay | Admitting: Family Medicine

## 2009-11-03 ENCOUNTER — Encounter: Payer: Self-pay | Admitting: Family Medicine

## 2009-11-03 ENCOUNTER — Ambulatory Visit: Payer: Self-pay | Admitting: Family Medicine

## 2009-11-03 DIAGNOSIS — E1169 Type 2 diabetes mellitus with other specified complication: Secondary | ICD-10-CM | POA: Insufficient documentation

## 2009-11-06 ENCOUNTER — Ambulatory Visit: Payer: Self-pay | Admitting: Gastroenterology

## 2009-11-08 ENCOUNTER — Ambulatory Visit: Payer: Self-pay | Admitting: Family Medicine

## 2009-11-12 ENCOUNTER — Ambulatory Visit (HOSPITAL_BASED_OUTPATIENT_CLINIC_OR_DEPARTMENT_OTHER): Admission: RE | Admit: 2009-11-12 | Discharge: 2009-11-12 | Payer: Self-pay | Admitting: Obstetrics and Gynecology

## 2009-11-12 ENCOUNTER — Ambulatory Visit: Payer: Self-pay | Admitting: Diagnostic Radiology

## 2009-11-12 ENCOUNTER — Encounter: Payer: Self-pay | Admitting: Family Medicine

## 2009-11-13 ENCOUNTER — Encounter (HOSPITAL_BASED_OUTPATIENT_CLINIC_OR_DEPARTMENT_OTHER): Admission: RE | Admit: 2009-11-13 | Discharge: 2009-12-31 | Payer: Self-pay | Admitting: Internal Medicine

## 2009-11-13 ENCOUNTER — Telehealth (INDEPENDENT_AMBULATORY_CARE_PROVIDER_SITE_OTHER): Payer: Self-pay | Admitting: *Deleted

## 2009-11-15 ENCOUNTER — Telehealth: Payer: Self-pay | Admitting: Family Medicine

## 2009-11-15 ENCOUNTER — Ambulatory Visit: Payer: Self-pay | Admitting: Family Medicine

## 2009-11-19 ENCOUNTER — Telehealth: Payer: Self-pay | Admitting: Family Medicine

## 2009-11-29 ENCOUNTER — Telehealth: Payer: Self-pay | Admitting: Family Medicine

## 2009-11-29 LAB — CONVERTED CEMR LAB
Alkaline Phosphatase: 60 units/L (ref 39–117)
BUN: 6 mg/dL (ref 6–23)
Basophils Absolute: 0 10*3/uL (ref 0.0–0.1)
Calcium: 9.3 mg/dL (ref 8.4–10.5)
Chloride: 102 meq/L (ref 96–112)
Creatinine, Ser: 0.7 mg/dL (ref 0.4–1.2)
Eosinophils Absolute: 0.1 10*3/uL (ref 0.0–0.7)
Hgb A1c MFr Bld: 6 % (ref 4.6–6.5)
Lymphocytes Relative: 46.6 % — ABNORMAL HIGH (ref 12.0–46.0)
MCHC: 33.4 g/dL (ref 30.0–36.0)
Monocytes Absolute: 0.3 10*3/uL (ref 0.1–1.0)
Neutro Abs: 2 10*3/uL (ref 1.4–7.7)
Neutrophils Relative %: 42.6 % — ABNORMAL LOW (ref 43.0–77.0)
Potassium: 4.4 meq/L (ref 3.5–5.1)
Total Bilirubin: 0.6 mg/dL (ref 0.3–1.2)
Triglycerides: 120 mg/dL (ref 0.0–149.0)
VLDL: 24 mg/dL (ref 0.0–40.0)
WBC: 4.6 10*3/uL (ref 4.5–10.5)

## 2009-12-05 ENCOUNTER — Ambulatory Visit: Admission: RE | Admit: 2009-12-05 | Discharge: 2009-12-05 | Payer: Self-pay | Admitting: General Surgery

## 2009-12-05 ENCOUNTER — Encounter (HOSPITAL_BASED_OUTPATIENT_CLINIC_OR_DEPARTMENT_OTHER): Payer: Self-pay | Admitting: General Surgery

## 2009-12-05 ENCOUNTER — Ambulatory Visit: Payer: Self-pay | Admitting: Vascular Surgery

## 2009-12-07 ENCOUNTER — Telehealth: Payer: Self-pay | Admitting: Gastroenterology

## 2009-12-11 ENCOUNTER — Ambulatory Visit: Payer: Self-pay | Admitting: Family Medicine

## 2009-12-12 ENCOUNTER — Ambulatory Visit: Payer: Self-pay | Admitting: Internal Medicine

## 2009-12-12 DIAGNOSIS — R142 Eructation: Secondary | ICD-10-CM

## 2009-12-12 DIAGNOSIS — R933 Abnormal findings on diagnostic imaging of other parts of digestive tract: Secondary | ICD-10-CM | POA: Insufficient documentation

## 2009-12-12 DIAGNOSIS — R141 Gas pain: Secondary | ICD-10-CM

## 2009-12-12 DIAGNOSIS — R143 Flatulence: Secondary | ICD-10-CM

## 2009-12-17 ENCOUNTER — Telehealth: Payer: Self-pay | Admitting: Family Medicine

## 2009-12-25 ENCOUNTER — Encounter: Admission: RE | Admit: 2009-12-25 | Discharge: 2009-12-25 | Payer: Self-pay | Admitting: Orthopedic Surgery

## 2009-12-27 ENCOUNTER — Encounter: Payer: Self-pay | Admitting: Family Medicine

## 2010-01-01 ENCOUNTER — Encounter (HOSPITAL_BASED_OUTPATIENT_CLINIC_OR_DEPARTMENT_OTHER): Admission: RE | Admit: 2010-01-01 | Discharge: 2010-03-05 | Payer: Self-pay | Admitting: Internal Medicine

## 2010-01-02 ENCOUNTER — Telehealth (INDEPENDENT_AMBULATORY_CARE_PROVIDER_SITE_OTHER): Payer: Self-pay | Admitting: *Deleted

## 2010-01-11 ENCOUNTER — Encounter: Admission: RE | Admit: 2010-01-11 | Discharge: 2010-01-11 | Payer: Self-pay | Admitting: Orthopedic Surgery

## 2010-01-21 ENCOUNTER — Telehealth: Payer: Self-pay | Admitting: Family Medicine

## 2010-01-22 ENCOUNTER — Encounter: Payer: Self-pay | Admitting: Family Medicine

## 2010-01-28 ENCOUNTER — Ambulatory Visit: Payer: Self-pay | Admitting: Gastroenterology

## 2010-01-29 ENCOUNTER — Ambulatory Visit: Payer: Self-pay | Admitting: Gastroenterology

## 2010-01-30 ENCOUNTER — Encounter: Payer: Self-pay | Admitting: Gastroenterology

## 2010-01-30 ENCOUNTER — Telehealth: Payer: Self-pay | Admitting: Gastroenterology

## 2010-02-08 ENCOUNTER — Ambulatory Visit (HOSPITAL_COMMUNITY): Admission: RE | Admit: 2010-02-08 | Discharge: 2010-02-08 | Payer: Self-pay | Admitting: Gastroenterology

## 2010-02-12 ENCOUNTER — Ambulatory Visit: Payer: Self-pay | Admitting: Family Medicine

## 2010-02-15 ENCOUNTER — Encounter: Payer: Self-pay | Admitting: Family Medicine

## 2010-02-16 ENCOUNTER — Observation Stay (HOSPITAL_COMMUNITY): Admission: EM | Admit: 2010-02-16 | Discharge: 2010-02-16 | Payer: Self-pay | Admitting: Emergency Medicine

## 2010-02-18 ENCOUNTER — Encounter: Payer: Self-pay | Admitting: Family Medicine

## 2010-02-20 ENCOUNTER — Ambulatory Visit: Payer: Self-pay | Admitting: Pulmonary Disease

## 2010-02-22 ENCOUNTER — Ambulatory Visit: Payer: Self-pay | Admitting: Gastroenterology

## 2010-02-26 ENCOUNTER — Encounter: Admission: RE | Admit: 2010-02-26 | Discharge: 2010-02-26 | Payer: Self-pay | Admitting: Orthopedic Surgery

## 2010-03-05 ENCOUNTER — Encounter: Payer: Self-pay | Admitting: Pulmonary Disease

## 2010-03-05 ENCOUNTER — Encounter: Payer: Self-pay | Admitting: Family Medicine

## 2010-03-05 ENCOUNTER — Ambulatory Visit (HOSPITAL_BASED_OUTPATIENT_CLINIC_OR_DEPARTMENT_OTHER): Admission: RE | Admit: 2010-03-05 | Discharge: 2010-03-05 | Payer: Self-pay | Admitting: Pulmonary Disease

## 2010-03-07 ENCOUNTER — Ambulatory Visit: Payer: Self-pay | Admitting: Family Medicine

## 2010-03-07 DIAGNOSIS — R350 Frequency of micturition: Secondary | ICD-10-CM | POA: Insufficient documentation

## 2010-03-07 DIAGNOSIS — R079 Chest pain, unspecified: Secondary | ICD-10-CM

## 2010-03-07 LAB — CONVERTED CEMR LAB
Ketones, urine, test strip: NEGATIVE
Nitrite: NEGATIVE
Protein, U semiquant: NEGATIVE
WBC Urine, dipstick: NEGATIVE
pH: 6

## 2010-03-08 ENCOUNTER — Encounter (INDEPENDENT_AMBULATORY_CARE_PROVIDER_SITE_OTHER): Payer: Self-pay | Admitting: *Deleted

## 2010-03-11 ENCOUNTER — Emergency Department (HOSPITAL_BASED_OUTPATIENT_CLINIC_OR_DEPARTMENT_OTHER): Admission: EM | Admit: 2010-03-11 | Discharge: 2010-03-11 | Payer: Self-pay | Admitting: Emergency Medicine

## 2010-03-11 ENCOUNTER — Ambulatory Visit: Payer: Self-pay | Admitting: Diagnostic Radiology

## 2010-03-18 ENCOUNTER — Ambulatory Visit: Payer: Self-pay | Admitting: Pulmonary Disease

## 2010-03-18 ENCOUNTER — Telehealth (INDEPENDENT_AMBULATORY_CARE_PROVIDER_SITE_OTHER): Payer: Self-pay | Admitting: *Deleted

## 2010-03-18 ENCOUNTER — Telehealth: Payer: Self-pay | Admitting: Pulmonary Disease

## 2010-03-19 ENCOUNTER — Telehealth: Payer: Self-pay | Admitting: Family Medicine

## 2010-03-19 ENCOUNTER — Ambulatory Visit: Payer: Self-pay | Admitting: Cardiology

## 2010-03-19 ENCOUNTER — Ambulatory Visit: Payer: Self-pay

## 2010-03-19 ENCOUNTER — Encounter (HOSPITAL_COMMUNITY): Admission: RE | Admit: 2010-03-19 | Discharge: 2010-05-29 | Payer: Self-pay | Admitting: Family Medicine

## 2010-03-21 ENCOUNTER — Encounter: Payer: Self-pay | Admitting: Family Medicine

## 2010-03-26 ENCOUNTER — Ambulatory Visit: Payer: Self-pay | Admitting: Pulmonary Disease

## 2010-03-26 DIAGNOSIS — G4733 Obstructive sleep apnea (adult) (pediatric): Secondary | ICD-10-CM | POA: Insufficient documentation

## 2010-03-27 ENCOUNTER — Encounter: Admission: RE | Admit: 2010-03-27 | Discharge: 2010-03-27 | Payer: Self-pay | Admitting: Specialist

## 2010-03-29 ENCOUNTER — Encounter: Payer: Self-pay | Admitting: Pulmonary Disease

## 2010-04-09 ENCOUNTER — Encounter: Payer: Self-pay | Admitting: Pulmonary Disease

## 2010-04-26 ENCOUNTER — Encounter: Payer: Self-pay | Admitting: Family Medicine

## 2010-04-29 ENCOUNTER — Encounter: Payer: Self-pay | Admitting: Pulmonary Disease

## 2010-05-02 ENCOUNTER — Telehealth: Payer: Self-pay | Admitting: Family Medicine

## 2010-05-07 ENCOUNTER — Ambulatory Visit: Payer: Self-pay | Admitting: Pulmonary Disease

## 2010-05-09 ENCOUNTER — Encounter: Payer: Self-pay | Admitting: Family Medicine

## 2010-06-07 ENCOUNTER — Encounter: Payer: Self-pay | Admitting: Gastroenterology

## 2010-06-07 ENCOUNTER — Telehealth: Payer: Self-pay | Admitting: Family Medicine

## 2010-06-13 ENCOUNTER — Encounter: Payer: Self-pay | Admitting: Pulmonary Disease

## 2010-06-18 ENCOUNTER — Telehealth: Payer: Self-pay | Admitting: Family Medicine

## 2010-06-24 ENCOUNTER — Encounter: Admission: RE | Admit: 2010-06-24 | Discharge: 2010-06-24 | Payer: Self-pay | Admitting: Orthopedic Surgery

## 2010-07-17 ENCOUNTER — Encounter: Admission: RE | Admit: 2010-07-17 | Discharge: 2010-07-17 | Payer: Self-pay | Admitting: Orthopedic Surgery

## 2010-07-22 ENCOUNTER — Telehealth: Payer: Self-pay | Admitting: Family Medicine

## 2010-07-22 ENCOUNTER — Encounter: Payer: Self-pay | Admitting: Pulmonary Disease

## 2010-07-23 ENCOUNTER — Encounter: Payer: Self-pay | Admitting: Family Medicine

## 2010-08-13 ENCOUNTER — Encounter: Admission: RE | Admit: 2010-08-13 | Discharge: 2010-08-13 | Payer: Self-pay | Admitting: Orthopedic Surgery

## 2010-08-26 ENCOUNTER — Ambulatory Visit: Payer: Self-pay | Admitting: Pulmonary Disease

## 2010-09-04 ENCOUNTER — Telehealth (INDEPENDENT_AMBULATORY_CARE_PROVIDER_SITE_OTHER): Payer: Self-pay | Admitting: *Deleted

## 2010-09-04 ENCOUNTER — Ambulatory Visit: Payer: Self-pay | Admitting: Family Medicine

## 2010-09-04 ENCOUNTER — Telehealth: Payer: Self-pay | Admitting: Gastroenterology

## 2010-09-04 DIAGNOSIS — H109 Unspecified conjunctivitis: Secondary | ICD-10-CM | POA: Insufficient documentation

## 2010-09-05 ENCOUNTER — Ambulatory Visit: Payer: Self-pay | Admitting: Family Medicine

## 2010-09-05 ENCOUNTER — Telehealth (INDEPENDENT_AMBULATORY_CARE_PROVIDER_SITE_OTHER): Payer: Self-pay | Admitting: *Deleted

## 2010-09-06 LAB — CONVERTED CEMR LAB: Vit D, 25-Hydroxy: 48 ng/mL (ref 30–89)

## 2010-09-12 ENCOUNTER — Ambulatory Visit: Payer: Self-pay | Admitting: Family Medicine

## 2010-09-12 LAB — CONVERTED CEMR LAB
ALT: 30 units/L (ref 0–35)
AST: 26 units/L (ref 0–37)
BUN: 10 mg/dL (ref 6–23)
Basophils Relative: 0.8 % (ref 0.0–3.0)
CO2: 32 meq/L (ref 19–32)
Calcium: 8.9 mg/dL (ref 8.4–10.5)
Creatinine,U: 80.1 mg/dL
Eosinophils Relative: 3.7 % (ref 0.0–5.0)
GFR calc non Af Amer: 148.09 mL/min (ref >60)
Glucose, Bld: 97 mg/dL (ref 70–99)
HCT: 34.2 % — ABNORMAL LOW (ref 36.0–46.0)
HDL: 53.9 mg/dL (ref >39.00)
Hemoglobin: 11.4 g/dL — ABNORMAL LOW (ref 12.0–15.0)
MCHC: 33.5 g/dL (ref 30.0–36.0)
Microalb Creat Ratio: 2.7 mg/g (ref 0.0–30.0)
Microalb, Ur: 2.2 mg/dL — ABNORMAL HIGH (ref 0.0–1.9)
Monocytes Relative: 7 % (ref 3.0–12.0)
Neutrophils Relative %: 53.9 % (ref 43.0–77.0)
Platelets: 236 10*3/uL (ref 150.0–400.0)

## 2010-10-15 ENCOUNTER — Ambulatory Visit: Payer: Self-pay | Admitting: Family Medicine

## 2010-10-15 DIAGNOSIS — M79609 Pain in unspecified limb: Secondary | ICD-10-CM

## 2010-10-15 DIAGNOSIS — M25519 Pain in unspecified shoulder: Secondary | ICD-10-CM

## 2010-10-22 ENCOUNTER — Encounter: Payer: Self-pay | Admitting: Family Medicine

## 2010-10-22 ENCOUNTER — Telehealth: Payer: Self-pay | Admitting: Family Medicine

## 2010-10-24 ENCOUNTER — Emergency Department (HOSPITAL_COMMUNITY): Admission: EM | Admit: 2010-10-24 | Discharge: 2010-10-24 | Payer: Self-pay | Admitting: Family Medicine

## 2010-10-24 ENCOUNTER — Telehealth: Payer: Self-pay | Admitting: Family Medicine

## 2010-10-29 ENCOUNTER — Encounter: Payer: Self-pay | Admitting: Family Medicine

## 2010-10-29 ENCOUNTER — Encounter: Admission: RE | Admit: 2010-10-29 | Discharge: 2010-10-29 | Payer: Self-pay | Admitting: Orthopedic Surgery

## 2010-10-31 ENCOUNTER — Encounter: Payer: Self-pay | Admitting: Family Medicine

## 2010-11-19 ENCOUNTER — Telehealth (INDEPENDENT_AMBULATORY_CARE_PROVIDER_SITE_OTHER): Payer: Self-pay | Admitting: *Deleted

## 2010-11-25 ENCOUNTER — Telehealth (INDEPENDENT_AMBULATORY_CARE_PROVIDER_SITE_OTHER): Payer: Self-pay | Admitting: *Deleted

## 2010-11-26 ENCOUNTER — Telehealth: Payer: Self-pay | Admitting: Family Medicine

## 2010-12-13 ENCOUNTER — Ambulatory Visit: Payer: Self-pay | Admitting: Family Medicine

## 2010-12-13 DIAGNOSIS — R21 Rash and other nonspecific skin eruption: Secondary | ICD-10-CM

## 2010-12-16 ENCOUNTER — Ambulatory Visit: Payer: Self-pay | Admitting: Family Medicine

## 2010-12-16 ENCOUNTER — Encounter: Payer: Self-pay | Admitting: Family Medicine

## 2010-12-19 ENCOUNTER — Ambulatory Visit: Payer: Self-pay | Admitting: Family Medicine

## 2010-12-19 DIAGNOSIS — E1149 Type 2 diabetes mellitus with other diabetic neurological complication: Secondary | ICD-10-CM

## 2010-12-20 LAB — CONVERTED CEMR LAB
Albumin: 3.4 g/dL — ABNORMAL LOW (ref 3.5–5.2)
CO2: 29 meq/L (ref 19–32)
Calcium: 9.1 mg/dL (ref 8.4–10.5)
Creatinine, Ser: 0.6 mg/dL (ref 0.4–1.2)
Eosinophils Relative: 4.6 % (ref 0.0–5.0)
Glucose, Bld: 96 mg/dL (ref 70–99)
HCT: 33.7 % — ABNORMAL LOW (ref 36.0–46.0)
HDL: 48.8 mg/dL (ref >39.00)
Lymphs Abs: 2.2 10*3/uL (ref 0.7–4.0)
MCV: 85.9 fL (ref 78.0–100.0)
Monocytes Absolute: 0.3 10*3/uL (ref 0.1–1.0)
Platelets: 244 10*3/uL (ref 150.0–400.0)
RDW: 14.8 % — ABNORMAL HIGH (ref 11.5–14.6)
Total Protein: 5.8 g/dL — ABNORMAL LOW (ref 6.0–8.3)
Triglycerides: 115 mg/dL (ref 0.0–149.0)
Vitamin B-12: 753 pg/mL (ref 211–911)
WBC: 5.4 10*3/uL (ref 4.5–10.5)
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
## 2010-12-25 ENCOUNTER — Encounter
Admission: RE | Admit: 2010-12-25 | Discharge: 2010-12-25 | Payer: Self-pay | Source: Home / Self Care | Attending: Orthopedic Surgery | Admitting: Orthopedic Surgery

## 2010-12-25 ENCOUNTER — Encounter: Payer: Self-pay | Admitting: Family Medicine

## 2011-01-19 ENCOUNTER — Encounter: Payer: Self-pay | Admitting: Family Medicine

## 2011-01-26 LAB — CONVERTED CEMR LAB
ALT: 19 units/L (ref 0–35)
AST: 17 units/L (ref 0–37)
Albumin: 3.5 g/dL (ref 3.5–5.2)
Alkaline Phosphatase: 52 units/L (ref 39–117)
BUN: 12 mg/dL (ref 6–23)
Basophils Absolute: 0 10*3/uL (ref 0.0–0.1)
Basophils Relative: 0.4 % (ref 0.0–3.0)
Bilirubin, Direct: 0.1 mg/dL (ref 0.0–0.3)
CO2: 30 meq/L (ref 19–32)
Calcium: 8.6 mg/dL (ref 8.4–10.5)
Chloride: 109 meq/L (ref 96–112)
Creatinine, Ser: 0.6 mg/dL (ref 0.4–1.2)
Eosinophils Absolute: 0.2 10*3/uL (ref 0.0–0.7)
Eosinophils Relative: 3.7 % (ref 0.0–5.0)
GFR calc Af Amer: 129 mL/min
GFR calc non Af Amer: 107 mL/min
Glucose, Bld: 95 mg/dL (ref 70–99)
HCT: 35.1 % — ABNORMAL LOW (ref 36.0–46.0)
Hemoglobin: 11.8 g/dL — ABNORMAL LOW (ref 12.0–15.0)
Hgb A1c MFr Bld: 6.3 % — ABNORMAL HIGH (ref 4.6–6.0)
Lymphocytes Relative: 42.5 % (ref 12.0–46.0)
MCHC: 33.7 g/dL (ref 30.0–36.0)
MCV: 85.7 fL (ref 78.0–100.0)
Monocytes Absolute: 0.5 10*3/uL (ref 0.1–1.0)
Monocytes Relative: 8.8 % (ref 3.0–12.0)
Neutro Abs: 2.5 10*3/uL (ref 1.4–7.7)
Neutrophils Relative %: 44.6 % (ref 43.0–77.0)
Platelets: 255 10*3/uL (ref 150–400)
Potassium: 4.3 meq/L (ref 3.5–5.1)
RBC: 4.1 M/uL (ref 3.87–5.11)
RDW: 14.2 % (ref 11.5–14.6)
Saturation Ratios: 14 % — ABNORMAL LOW (ref 20–55)
Sodium: 141 meq/L (ref 135–145)
TIBC: 299 ug/dL (ref 250–470)
Total Bilirubin: 0.5 mg/dL (ref 0.3–1.2)
Total Protein: 6 g/dL (ref 6.0–8.3)
WBC: 5.6 10*3/uL (ref 4.5–10.5)

## 2011-01-27 ENCOUNTER — Telehealth (INDEPENDENT_AMBULATORY_CARE_PROVIDER_SITE_OTHER): Payer: Self-pay | Admitting: *Deleted

## 2011-01-28 NOTE — Miscellaneous (Signed)
Summary: CPAP mask fit   Clinical Lists Changes Changed to medium nasal pillows Opus by Peri Maris. Orders: Added new Referral order of DME Referral (DME) - Signed

## 2011-01-28 NOTE — Progress Notes (Signed)
Summary: Triage-CT & Liver Lesions   Phone Note From Other Clinic   Caller: Selena Batten w/Dr.Lowne  [phone] Call For: Dr.Kaplan Summary of Call: Pt. had an MRI on 09-27-2009 for Liver lesions. Apparently she had a CT recently that showed the lesions and pt. is very concerned about them. Selena Batten will fax CT for Dr.Kaplan to review.  Initial call taken by: Laureen Ochs LPN,  September 04, 2010 4:48 PM  Follow-up for Phone Call        Recieved fax, will give to Dr Arlyce Dice for review. 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 Lesions of no clinical significance.   Additional Follow-up by: Louis Meckel MD,  September 05, 2010 9:08 AM    Additional Follow-up for Phone Call Additional follow up Details #2::    DR.KAPLAN-Pt. wants to know when you want another scan to follow-up on lesions? Laureen Ochs LPN  September 05, 2010 11:04 AM    Additional Follow-up for Phone Call Additional follow up Details #3:: Details for Additional Follow-up Action Taken: no f/u needed Additional Follow-up by: Louis Meckel MD,  September 05, 2010 2:47 PM  Above MD orders reviewed with patient. Pt. instructed to call back as needed. Laureen Ochs LPN  September 05, 2010 2:50 PM

## 2011-01-28 NOTE — Progress Notes (Signed)
Summary: Refill Request  Phone Note Refill Request Call back at [phone] Message from:  Pharmacy on July 22, 2010 2:53 PM  Refills Requested: Medication #1:  HYDROCODONE-ACETAMINOPHEN 7.5-750 MG TABS 1 by mouth every 6 hours as needed   Dosage confirmed as above?Dosage Confirmed   Supply Requested: 1 month   Last Refilled: 06/18/2010 [facility] [address]  Next Appointment Scheduled: none Initial call taken by: Lavell Islam,  July 22, 2010 2:53 PM  Follow-up for Phone Call        last filled 06-07-10 #120, last ov 03-07-10.Felecia Deloach CMA  July 22, 2010 5:23 PM   Additional Follow-up for Phone Call Additional follow up Details #1::        refill x1 Additional Follow-up by: Loreen Freud DO,  July 23, 2010 6:48 AM    Additional Follow-up for Phone Call Additional follow up Details #2::    rx [facility]ed [facility]...Marland KitchenMarland KitchenFelecia Deloach CMA  July 23, 2010 2:37 PM   Prescriptions: HYDROCODONE-ACETAMINOPHEN 7.5-750 MG TABS (HYDROCODONE-ACETAMINOPHEN) 1 by mouth every 6 hours as needed  #120 x 0   Entered by:   Jeremy Johann CMA   Authorized by:   Loreen Freud DO   Signed by:   Jeremy Johann CMA on 07/23/2010   Method used:   Printed then faxed to ...       [facility] [address] #[id number]* (retail)       [address] [place], [place]  [address]       Ph: [phone]       Fax: [phone]   RxID:   [id number]

## 2011-01-28 NOTE — Assessment & Plan Note (Signed)
Summary: Cardiology Nuclear Study  Nuclear Med Background Indications for Stress Test: Evaluation for Ischemia, Post Hospital  Indications Comments: 02/13/10 [facility] with chest pain and SOB  History: Asthma, Myocardial Perfusion Study  History Comments: '05 JXB:JYNWGN, EF=70%  Symptoms: Chest Pain, Chest Tightness, DOE, Fatigue, Nausea, SOB  Symptoms Comments: CP>neck. Last episode of FA:OZHYQ, none now.   Nuclear Pre-Procedure Cardiac Risk Factors: Hypertension, Lipids, NIDDM, Obesity Caffeine/Decaff Intake: None NPO After: 6:30 AM Lungs: Clear.  O2 Sat 94% on RA. IV 0.9% NS with Angio Cath: 22g     IV Site: (R) AC IV Started by: Irean Hong RN Chest Size (in) 42     Cup Size C     Height (in): 64 Weight (lb): 199 BMI: 34.28 Tech Comments: Unable to walk treadmill, changed to lexiscan.  Patsy Edwards,RN.  Nuclear Med Study 1 or 2 day study:  1 day     Stress Test Type:  Eugenie Birks Reading MD:  Willa Rough, MD     Referring MD:  Loreen Freud, MD Resting Radionuclide:  Technetium 52m Tetrofosmin     Resting Radionuclide Dose:  10.6 mCi  Stress Radionuclide:  Technetium 63m Tetrofosmin     Stress Radionuclide Dose:  33.0 mCi   Stress Protocol   Lexiscan: 0.4 mg   Stress Test Technologist:  Rea College CMA-N     Nuclear Technologist:  Burna Mortimer Deal RT-N  Rest Procedure  Myocardial perfusion imaging was performed at rest 45 minutes following the intravenous administration of Myoview Technetium 57m Tetrofosmin.  Stress Procedure  The patient received IV Lexiscan 0.4 mg over 15-seconds.  Myoview injected at 30-seconds.  There were no significant changes with lexiscan.  Quantitative spect images were obtained after a 45 minute delay.  QPS Raw Data Images:  Normal; no motion artifact; normal heart/lung ratio. Stress Images:  There is normal uptake in all areas. Rest Images:  Normal homogeneous uptake in all areas of the myocardium. Subtraction (SDS):  No evidence of  ischemia. Transient Ischemic Dilatation:  .91  (Normal <1.22)  Lung/Heart Ratio:  .30  (Normal <0.45)  Quantitative Gated Spect Images QGS EDV:  68 ml QGS ESV:  13 ml QGS EF:  81 % QGS cine images:  Normal motion  Findings Normal nuclear study      Overall Impression  Exercise Capacity: Lexiscan BP Response: Normal blood pressure response. Clinical Symptoms: SOB ECG Impression: No significant ST segment change suggestive of ischemia. Overall Impression: Normal stress nuclear study.  Appended Document: Cardiology Nuclear Study normal  Appended Document: Cardiology Nuclear Study Pt is aware.

## 2011-01-28 NOTE — Letter (Signed)
Summary: [facility]  [facility]   Imported By: Lanelle Bal 08/12/2010 14:15:05  _____________________________________________________________________  External Attachment:    Type:   Image     Comment:   External Document

## 2011-01-28 NOTE — Letter (Signed)
Summary: Encounter Notice/[facility]  Encounter Notice/[facility]   Imported By: Lanelle Bal 02/25/2010 08:05:35  _____________________________________________________________________  External Attachment:    Type:   Image     Comment:   External Document

## 2011-01-28 NOTE — Progress Notes (Signed)
Summary: lyrica  Phone Note Refill Request Message from:  Patient  Refills Requested: Medication #1:  LYRICA 75 MG  CAPS TAKE 1 CAPSULE BY MOUTH TWICE DAILY   Last Refilled: 10/2009   Notes: x 3 Pt states she needs a 90 day supply sent to [facility], but she is also currently out of the Lyrica. Please advise.    Follow-up for Phone Call        ok to send 1 month and 3 month with 3 refills Follow-up by: Loreen Freud DO,  June 18, 2010 10:52 AM    Prescriptions: LYRICA 75 MG  CAPS (PREGABALIN) TAKE 1 CAPSULE BY MOUTH TWICE DAILY  #30 x 0   Entered by:   Army Fossa CMA   Authorized by:   Loreen Freud DO   Signed by:   Army Fossa CMA on 06/18/2010   Method used:   Reprint   RxID:   [id number] LYRICA 75 MG  CAPS (PREGABALIN) TAKE 1 CAPSULE BY MOUTH TWICE DAILY  #30 x 3   Entered by:   Army Fossa CMA   Authorized by:   Loreen Freud DO   Signed by:   Army Fossa CMA on 06/18/2010   Method used:   Printed then faxed to ...       [facility] [address] #[id number]* (retail)       [address] [place], [place]  [address]       Ph: [phone]       Fax: [phone]   RxID:   [id number] LYRICA 75 MG  CAPS (PREGABALIN) TAKE 1 CAPSULE BY MOUTH TWICE DAILY  #180 x 3   Entered by:   Army Fossa CMA   Authorized by:   Loreen Freud DO   Signed by:   Army Fossa CMA on 06/18/2010   Method used:   Printed then faxed to ...       [facility] (mail-order)             , [place]         Ph: [phone]       Fax: [phone]   RxID:   [id number]  sent to 30 with 0 refills, not 30 with 3

## 2011-01-28 NOTE — Progress Notes (Signed)
Summary: MRI results   Phone Note Outgoing Call   Call placed by: Almeta Monas CMA Duncan Dull),  September 04, 2010 4:54 PM Call placed to: Specialist Details for Reason: MRI results Summary of Call: Called to Dr.Kaplan office to adv of MRI findings, they will f/u with the pt... MRI results Faxed to [phone]. Initial call taken by: Almeta Monas CMA Duncan Dull),  September 04, 2010 4:56 PM

## 2011-01-28 NOTE — Assessment & Plan Note (Signed)
Summary: snoring/apc   Copy to:    Primary Provider/Referring Provider:  Loreen Freud, DO  CC:  Sleep consult.Marland Kitchen  History of Present Illness: 70 yo female for sleep evaluation.  She has been having trouble with her sleep for some time.  Things have been getting worse.    She goes to bed at 1130pm and falls asleep quickly.  She uses the bathroom several times per night.  She will usually get out of bed at 7am, but still feels tired.  She will take a nap for 30 minutes during the day, but this does not help.  She gets frequent headaches, and always feels fatigued.  She does snore, and wakes up with a gasp.  She has more trouble sleeping on her back.  Her mouth is usually dry and her throat sore at night.  She does talk in her sleep, and will get frequent vivid dreams.  Her weight has been steady.  She denies sleep walking or nightmares.  She denies restless legs.  There is no history of sleep hallucinations, sleep paralysis, or cataplexy.  She does not use anything to help her sleep, or stay awake.   Preventive Screening-Counseling & Management  Alcohol-Tobacco     Alcohol drinks/day: 0     Smoking Status: never  Current Medications (verified): 1)  Hydrocodone-Acetaminophen 7.5-750 Mg Tabs (Hydrocodone-Acetaminophen) .Marland Kitchen.. 1 By Mouth Every 6 Hours As Needed 2)  Actos 45 Mg Tabs (Pioglitazone Hcl) .... Take One Tablet Daily 3)  Lyrica 75 Mg  Caps (Pregabalin) .... Take 1 Capsule By Mouth Twice Daily 4)  Quinapril Hcl 40 Mg  Tabs (Quinapril Hcl) .... Take One Tablet Daily 5)  Omeprazole 20 Mg  Tbec (Omeprazole) .... Take One Tablet Daily 6)  Singulair 10 Mg  Tabs (Montelukast Sodium) .... Take One Tablet Daily 7)  Estradiol 1 Mg  Tabs (Estradiol) .... Take One Tablet Daily 8)  Torsemide 20 Mg  Tabs (Torsemide) .... Take One Tablet Daily 9)  Zyrtec Allergy 10 Mg Tabs (Cetirizine Hcl) .... Take 1 Tablet By Mouth Once A Day 10)  Qvar 80 Mcg/act  Aers (Beclomethasone Dipropionate) .... Use  Once Daily As Directed 11)  Maxair Autohaler 200 Mcg/inh  Aerb (Pirbuterol Acetate) .... One Puff As Needed 12)  Patanol 0.1 %  Soln (Olopatadine Hcl) .... Use Once Daily 13)  Caltrate 600 1500 Mg  Tabs (Calcium Carbonate) .... One Tablet Daily 14)  Aspirin 81 Mg  Tbec (Aspirin) .... One Tablet Daily 15)  Amlodipine Besylate 5 Mg  Tabs (Amlodipine Besylate) .... Take One Tablet Daily 16)  Vitamin D 16109 Unit  Caps (Ergocalciferol) .Marland Kitchen.. 1 Cap 2 Times A Week 17)  Nasacort Aq 55 Mcg/act  Aers (Triamcinolone Acetonide(Nasal)) .... 2 Sprays Each Nostril Once Daily 18)  Stool Softener 100 Mg Caps (Docusate Sodium) .... Three Capsules By Mouth Daily 19)  Fiberchoice 2 Gm Chew (Inulin) .... Two Tablets By Mouth Daily 20)  Omega-3 350 Mg Caps (Omega-3 Fatty Acids) .... One Tablet By Mouth Once Daily 21)  Vitamin E 200 Unit Caps (Vitamin E) .... Take 1 Capsule By Mouth Once A Day 22)  Co Q-10 30 Mg Caps (Coenzyme Q10) .... Take 1 Capsule By Mouth Once A Day 23)  Vit B .... Take 1 Tablet By Mouth Once A Day 24)  Mucinex Dm Maximum Strength 60-1200 Mg  Xr12h-Tab (Dextromethorphan-Guaifenesin) .... As Needed. 25)  Nasonex 50 Mcg/act Susp (Mometasone Furoate) .... One Spray in Each Nostril Once Daily As Needed. 26)  Voltaren 1 % Gel (Diclofenac Sodium) .... Apply 4 G Qid As Needed 27)  Multigen Plus 50-101-1 Mg Tabs (Feasp-Fefum -Suc-C-Thre-B12-Fa) .Marland Kitchen.. 1 By Mouth Once Daily 28)  Lovaza 1 Gm Caps (Omega-3-Acid Ethyl Esters) .... 2 By Mouth Two Times A Day 29)  Silvadene 1 % Crea (Silver Sulfadiazine) .... As Directed 30)  Hydroxyzine Hcl 25 Mg Tabs (Hydroxyzine Hcl) .Marland Kitchen.. 1 By Mouth Every 4 Hrs As Needed 31)  Metrogel-Vaginal 0.75 % Gel (Metronidazole) 32)  Cipro 500 Mg Tabs (Ciprofloxacin Hcl) .Marland Kitchen.. 1 By Mouth Two Times A Day X5 Days  Allergies (verified): 1)  ! Ampicillin (Ampicillin) 2)  ! Synvisc (Hylan)  Past History:  Past Medical History: Reviewed history from 04/24/2008 and no changes  required. Allergic rhinitis Diabetes mellitus, type II Hyperlipidemia Hypertension Osteopenia Peripheral neuropathy  Past Surgical History: Reviewed history from 01/05/2009 and no changes required. STRESS TEST 09/2002, 07/09/2004 No Orthopedic surgery Hysterectomy and L ovary  Family History: Reviewed history from 12/04/2008 and no changes required. Lung cancer-father brother-bone cancer multiple myeloma  Family History of Colon Cancer:Uncle and 2 first cousins  Social History: Reviewed history from 01/28/2010 and no changes required. Patient has never smoked.  Alcohol Use - no Daily Caffeine Use Occupation: Retired Alcohol drinks/day:  0  Vital Signs:  Patient profile:   70 year old female Height:      64 inches (162.56 cm) Weight:      208 pounds (94.55 kg) BMI:     35.83 O2 Sat:      94 % on Room air Temp:     98.0 degrees F (36.67 degrees C) oral Pulse rate:   97 / minute BP sitting:   130 / 78  (left arm) Cuff size:   large  Vitals Entered By: Michel Bickers CMA (February 20, 2010 10:33 AM)  O2 Sat at Rest %:  94 O2 Flow:  Room air CC: Sleep consult. Is Patient Diabetic? Yes   Physical Exam  General:  obese.   Eyes:  PERRLA and EOMI.   Nose:  clear nasal discharge.   Mouth:  MP 4, enlarged tongue Neck:  no JVD.   Lungs:  clear bilaterally to auscultation and percussion Heart:  regular rate and rhythm, S1, S2 without murmurs, rubs, gallops, or clicks Abdomen:  Abdomen soft, nontender, nondistended. No obvious masses or hepatomegaly.Normal bowel sounds.  Msk:  decreased ROM.   Pulses:  pulses normal Extremities:  no clubbing, cyanosis, edema, or deformity noted Neurologic:  CN II-XII grossly intact with normal reflexes, coordination, muscle strength and tone Cervical Nodes:  no significant adenopathy   Impression & Recommendations:  Problem # 1:  SNORING, HX OF (ICD-V15.89) She has symptoms and findings suggestive of sleep apnea.  She has a history  of hypertension and diabetes.  To further evaluate this I will arrange for a sleep study.  I have explained how sleep apnea can affect her health.  Driving precautions and needed for weight loss were reviewed.  Of note is that she is on several pain medications, and these may be affecting her breathing while asleep.  Medications Added to Medication List This Visit: 1)  Cipro 500 Mg Tabs (Ciprofloxacin hcl) .Marland Kitchen.. 1 by mouth two times a day x5 days  Complete Medication List: 1)  Hydrocodone-acetaminophen 7.5-750 Mg Tabs (Hydrocodone-acetaminophen) .Marland Kitchen.. 1 by mouth every 6 hours as needed 2)  Actos 45 Mg Tabs (Pioglitazone hcl) .... Take one tablet daily 3)  Lyrica 75 Mg Caps (Pregabalin) .... Take 1  capsule by mouth twice daily 4)  Quinapril Hcl 40 Mg Tabs (Quinapril hcl) .... Take one tablet daily 5)  Omeprazole 20 Mg Tbec (Omeprazole) .... Take one tablet daily 6)  Singulair 10 Mg Tabs (Montelukast sodium) .... Take one tablet daily 7)  Estradiol 1 Mg Tabs (Estradiol) .... Take one tablet daily 8)  Torsemide 20 Mg Tabs (Torsemide) .... Take one tablet daily 9)  Zyrtec Allergy 10 Mg Tabs (Cetirizine hcl) .... Take 1 tablet by mouth once a day 10)  Qvar 80 Mcg/act Aers (Beclomethasone dipropionate) .... Use once daily as directed 11)  Maxair Autohaler 200 Mcg/inh Aerb (Pirbuterol acetate) .... One puff as needed 12)  Patanol 0.1 % Soln (Olopatadine hcl) .... Use once daily 13)  Caltrate 600 1500 Mg Tabs (Calcium carbonate) .... One tablet daily 14)  Aspirin 81 Mg Tbec (Aspirin) .... One tablet daily 15)  Amlodipine Besylate 5 Mg Tabs (Amlodipine besylate) .... Take one tablet daily 16)  Vitamin D 41962 Unit Caps (Ergocalciferol) .Marland Kitchen.. 1 cap 2 times a week 17)  Nasacort Aq 55 Mcg/act Aers (Triamcinolone acetonide(nasal)) .... 2 sprays each nostril once daily 18)  Stool Softener 100 Mg Caps (Docusate sodium) .... Three capsules by mouth daily 19)  Fiberchoice 2 Gm Chew (Inulin) .... Two tablets by  mouth daily 20)  Omega-3 350 Mg Caps (Omega-3 fatty acids) .... One tablet by mouth once daily 21)  Vitamin E 200 Unit Caps (Vitamin e) .... Take 1 capsule by mouth once a day 22)  Co Q-10 30 Mg Caps (Coenzyme q10) .... Take 1 capsule by mouth once a day 23)  Vit B  .... Take 1 tablet by mouth once a day 24)  Mucinex Dm Maximum Strength 60-1200 Mg Xr12h-tab (Dextromethorphan-guaifenesin) .... As needed. 25)  Nasonex 50 Mcg/act Susp (Mometasone furoate) .... One spray in each nostril once daily as needed. 26)  Voltaren 1 % Gel (Diclofenac sodium) .... Apply 4 g qid as needed 27)  Multigen Plus 50-101-1 Mg Tabs (Feasp-fefum -suc-c-thre-b12-fa) .Marland Kitchen.. 1 by mouth once daily 28)  Lovaza 1 Gm Caps (Omega-3-acid ethyl esters) .... 2 by mouth two times a day 29)  Silvadene 1 % Crea (Silver sulfadiazine) .... As directed 30)  Hydroxyzine Hcl 25 Mg Tabs (Hydroxyzine hcl) .Marland Kitchen.. 1 by mouth every 4 hrs as needed 31)  Metrogel-vaginal 0.75 % Gel (Metronidazole) 32)  Cipro 500 Mg Tabs (Ciprofloxacin hcl) .Marland Kitchen.. 1 by mouth two times a day x5 days  Other Orders: Consultation Level IV (22979) Sleep Disorder Referral (Sleep Disorder)  Patient Instructions: 1)  Will schedule sleep test 2)  Will call to schedule follow up after sleep test is ready

## 2011-01-28 NOTE — Assessment & Plan Note (Signed)
Summary: f/u CPAP/LC   Visit Type:  Follow-up Copy to:  n/a Primary Provider/Referring Provider:  Loreen Freud, DO  CC:  CPAP follow-up.  The patient says she is not as tired during the day but would like to sleep longer in the mornings. She is using the cpap approx 7-8 hours nightly.Marland Kitchen  History of Present Illness: 70 yo female with OSA  She has done well with CPAP.  She is now using her machine for about 5 to 7 hours per night.  She has been doing okay with her mask.  She is sleeping better, and feels better during the day.  She has been having trouble with her humidifer, and has been getting dryness in her nose.  Current Medications (verified): 1)  Hydrocodone-Acetaminophen 7.5-750 Mg Tabs (Hydrocodone-Acetaminophen) .Marland Kitchen.. 1 By Mouth Every 6 Hours As Needed 2)  Actos 45 Mg Tabs (Pioglitazone Hcl) .... Take One Tablet Daily 3)  Lyrica 75 Mg  Caps (Pregabalin) .... Take 1 Capsule By Mouth Twice Daily 4)  Quinapril Hcl 40 Mg  Tabs (Quinapril Hcl) .... Take One Tablet Daily 5)  Omeprazole 20 Mg  Tbec (Omeprazole) .... Take One Tablet Daily 6)  Singulair 10 Mg  Tabs (Montelukast Sodium) .... Take One Tablet Daily 7)  Estradiol 1 Mg  Tabs (Estradiol) .... Take One Tablet Daily 8)  Torsemide 20 Mg  Tabs (Torsemide) .... Take One Tablet Daily 9)  Zyrtec Allergy 10 Mg Tabs (Cetirizine Hcl) .... Take 1 Tablet By Mouth Once A Day 10)  Qvar 80 Mcg/act  Aers (Beclomethasone Dipropionate) .... Use Once Daily As Directed 11)  Maxair Autohaler 200 Mcg/inh  Aerb (Pirbuterol Acetate) .... One Puff As Needed 12)  Patanol 0.1 %  Soln (Olopatadine Hcl) .... Use Once Daily 13)  Caltrate 600 1500 Mg  Tabs (Calcium Carbonate) .... One Tablet Daily 14)  Aspirin 81 Mg  Tbec (Aspirin) .... One Tablet Daily 15)  Amlodipine Besylate 5 Mg  Tabs (Amlodipine Besylate) .... Take One Tablet Daily 16)  Vitamin D 02725 Unit  Caps (Ergocalciferol) .Marland Kitchen.. 1 Cap 2 Times A Week 17)  Nasacort Aq 55 Mcg/act  Aers (Triamcinolone  Acetonide(Nasal)) .... 2 Sprays Each Nostril Once Daily 18)  Stool Softener 100 Mg Caps (Docusate Sodium) .... Three Capsules By Mouth Daily 19)  Fiberchoice 2 Gm Chew (Inulin) .... Two Tablets By Mouth Daily 20)  Omega-3 350 Mg Caps (Omega-3 Fatty Acids) .... One Tablet By Mouth Once Daily 21)  Vitamin E 200 Unit Caps (Vitamin E) .... Take 1 Capsule By Mouth Once A Day 22)  Co Q-10 30 Mg Caps (Coenzyme Q10) .... Take 1 Capsule By Mouth Once A Day 23)  Vit B .... Take 1 Tablet By Mouth Once A Day 24)  Mucinex Dm Maximum Strength 60-1200 Mg  Xr12h-Tab (Dextromethorphan-Guaifenesin) .... As Needed. 25)  Nasonex 50 Mcg/act Susp (Mometasone Furoate) .... One Spray in Each Nostril Once Daily As Needed. 26)  Voltaren 1 % Gel (Diclofenac Sodium) .... Apply 4 G Qid As Needed 27)  Multigen Plus 50-101-1 Mg Tabs (Feasp-Fefum -Suc-C-Thre-B12-Fa) .Marland Kitchen.. 1 By Mouth Once Daily 28)  Lovaza 1 Gm Caps (Omega-3-Acid Ethyl Esters) .... 2 By Mouth Two Times A Day 29)  Silvadene 1 % Crea (Silver Sulfadiazine) .... As Directed 30)  Hydroxyzine Hcl 25 Mg Tabs (Hydroxyzine Hcl) .Marland Kitchen.. 1 By Mouth Every 4 Hrs As Needed 31)  Metrogel-Vaginal 0.75 % Gel (Metronidazole) 32)  Bentyl 10 Mg Caps (Dicyclomine Hcl) .... Take One Tablet Q.i.d. P.r.n. 33)  Vesicare 5 Mg Tabs (Solifenacin Succinate) .Marland Kitchen.. 1 By Mouth Once Daily 34)  Ibuprofen 200 Mg Tabs (Ibuprofen) .... 2 To 3 By Mouth Every 8 Hours 35)  Diazepam 5 Mg Tabs (Diazepam) .Marland Kitchen.. 1 By Mouth Every 6 Hours  Allergies (verified): 1)  ! Ampicillin (Ampicillin) 2)  ! Synvisc (Hylan)  Past History:  Past Medical History: Allergic rhinitis Diabetes mellitus, type II Hyperlipidemia Hypertension Osteopenia Peripheral neuropathy OSA      - PSG 03/05/10 RDI 9, REM predominant      - CPAP 10 cm H2O  Past Surgical History: Reviewed history from 01/05/2009 and no changes required. STRESS TEST 09/2002, 07/09/2004 No Orthopedic surgery Hysterectomy and L ovary  Vital  Signs:  Patient profile:   71 year old female Height:      64 inches (162.56 cm) Weight:      206 pounds (93.64 kg) BMI:     35.49 O2 Sat:      95 % on Room air Temp:     98.1 degrees F (36.72 degrees C) oral Pulse rate:   106 / minute BP sitting:   156 / 64  (right arm) Cuff size:   large  Vitals Entered By: Michel Bickers CMA (May 07, 2010 10:01 AM)  O2 Sat at Rest %:  95 O2 Flow:  Room air CC: CPAP follow-up.  The patient says she is not as tired during the day but would like to sleep longer in the mornings. She is using the cpap approx 7-8 hours nightly. Comments Medications reviewed. Michel Bickers CMA  May 07, 2010 10:02 AM   Physical Exam  General:  obese.   Nose:  clear nasal discharge.   Mouth:  MP 4, enlarged tongue Neck:  no JVD.   Lungs:  clear bilaterally to auscultation and percussion Heart:  regular rate and rhythm, S1, S2 without murmurs, rubs, gallops, or clicks Extremities:  no clubbing, cyanosis, edema, or deformity noted   Impression & Recommendations:  Problem # 1:  OBSTRUCTIVE SLEEP APNEA (ICD-327.23) She has done well with CPAP.  Will continue on CPAP 10 cm.  Emphasized the need for weight loss.  She is to contact her DME about checking her humidity set up.  Complete Medication List: 1)  Hydrocodone-acetaminophen 7.5-750 Mg Tabs (Hydrocodone-acetaminophen) .Marland Kitchen.. 1 by mouth every 6 hours as needed 2)  Actos 45 Mg Tabs (Pioglitazone hcl) .... Take one tablet daily 3)  Lyrica 75 Mg Caps (Pregabalin) .... Take 1 capsule by mouth twice daily 4)  Quinapril Hcl 40 Mg Tabs (Quinapril hcl) .... Take one tablet daily 5)  Omeprazole 20 Mg Tbec (Omeprazole) .... Take one tablet daily 6)  Singulair 10 Mg Tabs (Montelukast sodium) .... Take one tablet daily 7)  Estradiol 1 Mg Tabs (Estradiol) .... Take one tablet daily 8)  Torsemide 20 Mg Tabs (Torsemide) .... Take one tablet daily 9)  Zyrtec Allergy 10 Mg Tabs (Cetirizine hcl) .... Take 1 tablet by mouth once a  day 10)  Qvar 80 Mcg/act Aers (Beclomethasone dipropionate) .... Use once daily as directed 11)  Maxair Autohaler 200 Mcg/inh Aerb (Pirbuterol acetate) .... One puff as needed 12)  Patanol 0.1 % Soln (Olopatadine hcl) .... Use once daily 13)  Caltrate 600 1500 Mg Tabs (Calcium carbonate) .... One tablet daily 14)  Aspirin 81 Mg Tbec (Aspirin) .... One tablet daily 15)  Amlodipine Besylate 5 Mg Tabs (Amlodipine besylate) .... Take one tablet daily 16)  Vitamin D 29562 Unit Caps (Ergocalciferol) .Marland Kitchen.. 1 cap  2 times a week 17)  Nasacort Aq 55 Mcg/act Aers (Triamcinolone acetonide(nasal)) .... 2 sprays each nostril once daily 18)  Stool Softener 100 Mg Caps (Docusate sodium) .... Three capsules by mouth daily 19)  Fiberchoice 2 Gm Chew (Inulin) .... Two tablets by mouth daily 20)  Omega-3 350 Mg Caps (Omega-3 fatty acids) .... One tablet by mouth once daily 21)  Vitamin E 200 Unit Caps (Vitamin e) .... Take 1 capsule by mouth once a day 22)  Co Q-10 30 Mg Caps (Coenzyme q10) .... Take 1 capsule by mouth once a day 23)  Vit B  .... Take 1 tablet by mouth once a day 24)  Mucinex Dm Maximum Strength 60-1200 Mg Xr12h-tab (Dextromethorphan-guaifenesin) .... As needed. 25)  Nasonex 50 Mcg/act Susp (Mometasone furoate) .... One spray in each nostril once daily as needed. 26)  Voltaren 1 % Gel (Diclofenac sodium) .... Apply 4 g qid as needed 27)  Multigen Plus 50-101-1 Mg Tabs (Feasp-fefum -suc-c-thre-b12-fa) .Marland Kitchen.. 1 by mouth once daily 28)  Lovaza 1 Gm Caps (Omega-3-acid ethyl esters) .... 2 by mouth two times a day 29)  Silvadene 1 % Crea (Silver sulfadiazine) .... As directed 30)  Hydroxyzine Hcl 25 Mg Tabs (Hydroxyzine hcl) .Marland Kitchen.. 1 by mouth every 4 hrs as needed 31)  Metrogel-vaginal 0.75 % Gel (Metronidazole) 32)  Bentyl 10 Mg Caps (Dicyclomine hcl) .... Take one tablet q.i.d. p.r.n. 33)  Vesicare 5 Mg Tabs (Solifenacin succinate) .Marland Kitchen.. 1 by mouth once daily 34)  Ibuprofen 200 Mg Tabs (Ibuprofen) ....  2 to 3 by mouth every 8 hours 35)  Diazepam 5 Mg Tabs (Diazepam) .Marland Kitchen.. 1 by mouth every 6 hours  Other Orders: Est. Patient Level III (94854) DME Referral (DME)  Patient Instructions: 1)  Follow up in 4 months

## 2011-01-28 NOTE — Assessment & Plan Note (Signed)
Summary: discuss lab results//lch   Vital Signs:  Patient profile:   70 year old female Weight:      202.2 pounds Pulse rate:   76 / minute Pulse rhythm:   regular BP sitting:   140 / 74  (right arm)  Vitals Entered By: Almeta Monas CMA Duncan Dull) (September 12, 2010 3:35 PM) CC: discuss labs   History of Present Illness: Stacey Leon here to go over labs.  No complaints.  Current Medications (verified): 1)  Hydrocodone-Acetaminophen 7.5-750 Mg Tabs (Hydrocodone-Acetaminophen) .Marland Kitchen.. 1 By Mouth Every 6 Hours As Needed 2)  Actos 45 Mg Tabs (Pioglitazone Hcl) .... Take One Tablet Daily 3)  Lyrica 75 Mg  Caps (Pregabalin) .... Take 1 Capsule By Mouth Twice Daily 4)  Quinapril Hcl 40 Mg  Tabs (Quinapril Hcl) .... Take One Tablet Daily 5)  Omeprazole 20 Mg  Tbec (Omeprazole) .... Take One Tablet Daily 6)  Singulair 10 Mg  Tabs (Montelukast Sodium) .... Take One Tablet Daily 7)  Estradiol 1 Mg  Tabs (Estradiol) .... Take One Tablet Daily 8)  Torsemide 20 Mg  Tabs (Torsemide) .... Take One Tablet Daily 9)  Zyrtec Allergy 10 Mg Tabs (Cetirizine Hcl) .... Take 1 Tablet By Mouth Once A Day 10)  Qvar 80 Mcg/act  Aers (Beclomethasone Dipropionate) .... Use Once Daily As Directed 11)  Maxair Autohaler 200 Mcg/inh  Aerb (Pirbuterol Acetate) .... One Puff As Needed 12)  Patanol 0.1 %  Soln (Olopatadine Hcl) .... Use Once Daily 13)  Caltrate 600 1500 Mg  Tabs (Calcium Carbonate) .... One Tablet Daily 14)  Aspirin 81 Mg  Tbec (Aspirin) .... One Tablet Daily 15)  Amlodipine Besylate 5 Mg  Tabs (Amlodipine Besylate) .... Take One Tablet Daily 16)  Vitamin D 37106 Unit  Caps (Ergocalciferol) .Marland Kitchen.. 1 Cap 2 Times A Week 17)  Nasacort Aq 55 Mcg/act  Aers (Triamcinolone Acetonide(Nasal)) .... 2 Sprays Each Nostril Once Daily 18)  Stool Softener 100 Mg Caps (Docusate Sodium) .... Three Capsules By Mouth Daily 19)  Fiberchoice 2 Gm Chew (Inulin) .... Two Tablets By Mouth Daily 20)  Omega-3 350 Mg Caps (Omega-3 Fatty  Acids) .... One Tablet By Mouth Once Daily 21)  Vitamin E 200 Unit Caps (Vitamin E) .... Take 1 Capsule By Mouth Once A Day 22)  Co Q-10 30 Mg Caps (Coenzyme Q10) .... Take 1 Capsule By Mouth Once A Day 23)  Vit B .... Take 1 Tablet By Mouth Once A Day 24)  Mucinex Dm Maximum Strength 60-1200 Mg  Xr12h-Tab (Dextromethorphan-Guaifenesin) .... As Needed. 25)  Nasonex 50 Mcg/act Susp (Mometasone Furoate) .... One Spray in Each Nostril Once Daily As Needed. 26)  Voltaren 1 % Gel (Diclofenac Sodium) .... Apply 4 G Qid As Needed 27)  Multigen Plus 50-101-1 Mg Tabs (Feasp-Fefum -Suc-C-Thre-B12-Fa) .Marland Kitchen.. 1 By Mouth Once Daily 28)  Lovaza 1 Gm Caps (Omega-3-Acid Ethyl Esters) .... 2 By Mouth Two Times A Day 29)  Silvadene 1 % Crea (Silver Sulfadiazine) .... As Directed 30)  Hydroxyzine Hcl 25 Mg Tabs (Hydroxyzine Hcl) .Marland Kitchen.. 1 By Mouth Every 4 Hrs As Needed 31)  Amoxicillin 875 Mg Tabs (Amoxicillin) .Marland Kitchen.. 1 By Mouth Once Daily X10days  Allergies (verified): 1)  ! Ampicillin (Ampicillin) 2)  ! Synvisc (Hylan)  Past History:  Past medical, surgical, family and social histories (including risk factors) reviewed for relevance to current acute and chronic problems.  Past Medical History: Reviewed history from 05/07/2010 and no changes required. Allergic rhinitis Diabetes mellitus, type II  Hyperlipidemia Hypertension Osteopenia Peripheral neuropathy OSA      - PSG 03/05/10 RDI 9, REM predominant      - CPAP 10 cm H2O  Past Surgical History: Reviewed history from 01/05/2009 and no changes required. STRESS TEST 09/2002, 07/09/2004 No Orthopedic surgery Hysterectomy and L ovary  Family History: Reviewed history from 12/04/2008 and no changes required. Lung cancer-father brother-bone cancer multiple myeloma  Family History of Colon Cancer:Uncle and 2 first cousins  Social History: Reviewed history from 01/28/2010 and no changes required. Patient has never smoked.  Alcohol Use - no Daily  Caffeine Use Occupation: Retired  Review of Systems      See HPI  Physical Exam  General:  Well-developed,well-nourished,in no acute distress; alert,appropriate and cooperative throughout examination Psych:  Oriented X3 and normally interactive.     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 Take one tablet daily    Torsemide 20 Mg Tabs (Torsemide) .Marland Kitchen... Take one tablet daily    Amlodipine Besylate 5 Mg Tabs (Amlodipine besylate) .Marland Kitchen... Take one tablet daily  BP today: 140/74 Prior BP: 146/72 (09/04/2010)  Labs Reviewed: K+: 4.8 (09/05/2010) Creat: : 0.5 (09/05/2010)   Chol: 235 (09/05/2010)   HDL: 53.90 (09/05/2010)   LDL: 130 (12/11/2008)   TG: 101.0 (09/05/2010)  Problem # 3:  HYPERLIPIDEMIA (ICD-272.4)  Her updated medication list for this problem includes:    Lovaza 1 Gm Caps (Omega-3-acid ethyl esters) .Marland Kitchen... 2 by mouth two times a day  Labs Reviewed: SGOT: 26 (09/05/2010)   SGPT: 30 (09/05/2010)   HDL:53.90 (09/05/2010), 51.40 (11/15/2009)  LDL:130 (12/11/2008)  Chol:235 (09/05/2010), 222 (11/15/2009)  Trig:101.0 (09/05/2010), 120.0 (11/15/2009)  Problem # 4:  DIABETES MELLITUS, TYPE II (ICD-250.00)  Her updated medication list for this problem includes:    Actos 45 Mg Tabs (Pioglitazone hcl) .Marland Kitchen... Take one tablet daily    Quinapril Hcl 40 Mg Tabs (Quinapril hcl) .Marland Kitchen... Take one tablet daily    Aspirin 81 Mg Tbec (Aspirin) ..... One tablet daily  Labs Reviewed: Creat: 0.5 (09/05/2010)    Reviewed HgBA1c results: 6.9 (09/05/2010)  6.0 (11/15/2009)  Complete Medication List: 1)  Hydrocodone-acetaminophen 7.5-750 Mg Tabs (Hydrocodone-acetaminophen) .Marland Kitchen.. 1 by mouth every 6 hours as needed 2)  Actos 45 Mg Tabs (Pioglitazone hcl) .... Take one tablet daily 3)  Lyrica 75 Mg  Caps (Pregabalin) .... Take 1 capsule by mouth twice daily 4)  Quinapril Hcl 40 Mg Tabs (Quinapril hcl) .... Take one tablet daily 5)  Omeprazole 20 Mg Tbec (Omeprazole) .... Take one tablet daily 6)  Singulair 10 Mg Tabs (Montelukast sodium) .... Take one tablet daily 7)  Estradiol 1 Mg Tabs (Estradiol) .... Take one tablet daily 8)  Torsemide 20 Mg Tabs (Torsemide) .... Take one tablet daily 9)  Zyrtec Allergy 10 Mg Tabs (Cetirizine hcl) .... Take 1 tablet by mouth once a day 10)  Qvar 80 Mcg/act Aers (Beclomethasone dipropionate) .... Use once daily as directed 11)  Maxair Autohaler 200 Mcg/inh Aerb (Pirbuterol acetate) .... One puff as needed 12)  Patanol 0.1 % Soln (Olopatadine hcl) .... Use once daily 13)  Caltrate 600 1500 Mg Tabs (Calcium carbonate) .... One tablet daily 14)  Aspirin 81 Mg Tbec (Aspirin) .... One tablet daily 15)  Amlodipine Besylate 5 Mg Tabs (Amlodipine besylate) .... Take one  tablet daily 16)  Vitamin D 16109 Unit Caps (Ergocalciferol) .Marland Kitchen.. 1 cap 2 times a week 17)  Nasacort Aq 55 Mcg/act Aers (Triamcinolone acetonide(nasal)) .... 2 sprays each nostril once daily 18)  Stool Softener 100 Mg Caps (Docusate sodium) .... Three capsules by mouth daily 19)  Fiberchoice 2 Gm Chew (Inulin) .... Two tablets by mouth daily 20)  Omega-3 350 Mg Caps (Omega-3 fatty acids) .... One tablet by mouth once daily 21)  Vitamin E 200 Unit Caps (Vitamin e) .... Take 1 capsule by mouth once a day 22)  Co Q-10 30 Mg Caps (Coenzyme q10) .... Take 1 capsule by mouth once a day 23)  Vit B  .... Take 1 tablet by mouth once a day 24)  Mucinex Dm Maximum Strength 60-1200 Mg Xr12h-tab (Dextromethorphan-guaifenesin) .... As needed. 25)  Nasonex 50 Mcg/act Susp (Mometasone furoate) .... One spray in each nostril once daily as needed. 26)  Voltaren 1 % Gel (Diclofenac sodium) .... Apply 4 g qid as needed 27)  Multigen Plus 50-101-1 Mg Tabs (Feasp-fefum -suc-c-thre-b12-fa) .Marland Kitchen.. 1 by mouth once  daily 28)  Lovaza 1 Gm Caps (Omega-3-acid ethyl esters) .... 2 by mouth two times a day 29)  Silvadene 1 % Crea (Silver sulfadiazine) .... As directed 30)  Hydroxyzine Hcl 25 Mg Tabs (Hydroxyzine hcl) .Marland Kitchen.. 1 by mouth every 4 hrs as needed 31)  Amoxicillin 875 Mg Tabs (Amoxicillin) .Marland Kitchen.. 1 by mouth once daily x10days  Patient Instructions: 1)  Please schedule a follow-up appointment in 3 months .  2)  Please return for lab work one(1) week before your next appointment. -- 250.00, 272.4  lipid, hep, bmp, hgba1c Prescriptions: MULTIGEN PLUS 50-101-1 MG TABS (FEASP-FEFUM -SUC-C-THRE-B12-FA) 1 by mouth once daily  #30 x 11   Entered and Authorized by:   Loreen Freud DO   Signed by:   Loreen Freud DO on 09/12/2010   Method used:   Electronically to        [id number] (retail)       [address] [place], [place]  [address]       Ph: [phone]       Fax: [phone]   RxID:   [id number]

## 2011-01-28 NOTE — Progress Notes (Signed)
Summary: results  Phone Note Call from Patient   Caller: Patient Call For: Stacey Leon Summary of Call: calling for sleep study results Initial call taken by: Rickard Patience,  March 18, 2010 3:01 PM  Follow-up for Phone Call        pt calling for split night study results.  please advise. thank you.  Aundra Millet Reynolds LPN  March 18, 2010 3:03 PM   Additional Follow-up for Phone Call Additional follow up Details #1::        discussed results with pt. Additional Follow-up by: Coralyn Helling MD,  March 18, 2010 4:58 PM

## 2011-01-28 NOTE — Miscellaneous (Signed)
Summary: Overnight polysomnogram from 03/05/10   Clinical Lists Changes RDI 9, PLMI 0.  REM AHI 8.  d/w pt over the phone.  Will have my nurse schedule next available ROV to discuss further.  Appended Document: Overnight polysomnogram from 03/05/10 The patient is sch for follow--up with Dr. Craige Cotta on 03/26/2010 @ 4:30pm.

## 2011-01-28 NOTE — Letter (Signed)
Summary: Primary Care Consult Scheduled Letter  [facility]  [address] [place], [place] [address]   Phone: [phone]  Fax: [phone]      03/08/2010 MRN: [id number]  Stacey Leon [address] [place], [place]  [address]    Dear [name],    We have scheduled an appointment for you.  At the recommendation of Dr. Loreen Freud, we have scheduled you for a Cardiolite Stress Test with Selena Batten on 03-18-2010 at 8:15am.  Their address is [address] floor, [place] [address]. The office phone number is [phone].  If this appointment day and time is not convenient for you, please feel free to call the office of the doctor you are being referred to at the number listed above and reschedule the appointment.    It is important for you to keep your scheduled appointments. We are here to make sure you are given good patient care.   Thank you,    Renee, Patient Care Coordinator [facility]    *****NOTHING TO EAT OR DRINK 4 HOURS PRIOR TO THIS APPOINTMENT.  DO NOT SMOKE OR CONSUME ANY CAFFEINE 12 HOURS PRIOR TO THIS APPOINTMENT*****

## 2011-01-28 NOTE — Assessment & Plan Note (Signed)
Summary: 4 months/  mbw   Visit Type:  Follow-up Copy to:  n/a Primary Provider/Referring Provider:  Loreen Freud, DO  CC:  OSA follow-up.  the patient says she finally got the "kinks"  worked out  with her cpap machine and it has been working properly for approx 1 month. She does feel less tired during the day and her quality of sleep is better.Marland Kitchen  History of Present Illness: 70 yo female with OSA using CPAP 10 cm.  She received a new machine from her DME.  Since she got her new machine, she has been sleeping much better.  The old CPAP machine had a broken heating unit and would not generate pressure at times.  She is using nasal pillows.  She doesn't feel like her mask fits properly.  Current Medications (verified): 1)  Hydrocodone-Acetaminophen 7.5-750 Mg Tabs (Hydrocodone-Acetaminophen) .Marland Kitchen.. 1 By Mouth Every 6 Hours As Needed 2)  Actos 45 Mg Tabs (Pioglitazone Hcl) .... Take One Tablet Daily 3)  Lyrica 75 Mg  Caps (Pregabalin) .... Take 1 Capsule By Mouth Twice Daily 4)  Quinapril Hcl 40 Mg  Tabs (Quinapril Hcl) .... Take One Tablet Daily 5)  Omeprazole 20 Mg  Tbec (Omeprazole) .... Take One Tablet Daily 6)  Singulair 10 Mg  Tabs (Montelukast Sodium) .... Take One Tablet Daily 7)  Estradiol 1 Mg  Tabs (Estradiol) .... Take One Tablet Daily 8)  Torsemide 20 Mg  Tabs (Torsemide) .... Take One Tablet Daily 9)  Zyrtec Allergy 10 Mg Tabs (Cetirizine Hcl) .... Take 1 Tablet By Mouth Once A Day 10)  Qvar 80 Mcg/act  Aers (Beclomethasone Dipropionate) .... Use Once Daily As Directed 11)  Maxair Autohaler 200 Mcg/inh  Aerb (Pirbuterol Acetate) .... One Puff As Needed 12)  Patanol 0.1 %  Soln (Olopatadine Hcl) .... Use Once Daily 13)  Caltrate 600 1500 Mg  Tabs (Calcium Carbonate) .... One Tablet Daily 14)  Aspirin 81 Mg  Tbec (Aspirin) .... One Tablet Daily 15)  Amlodipine Besylate 5 Mg  Tabs (Amlodipine Besylate) .... Take One Tablet Daily 16)  Vitamin D 46962 Unit  Caps (Ergocalciferol)  .Marland Kitchen.. 1 Cap 2 Times A Week 17)  Nasacort Aq 55 Mcg/act  Aers (Triamcinolone Acetonide(Nasal)) .... 2 Sprays Each Nostril Once Daily 18)  Stool Softener 100 Mg Caps (Docusate Sodium) .... Three Capsules By Mouth Daily 19)  Fiberchoice 2 Gm Chew (Inulin) .... Two Tablets By Mouth Daily 20)  Omega-3 350 Mg Caps (Omega-3 Fatty Acids) .... One Tablet By Mouth Once Daily 21)  Vitamin E 200 Unit Caps (Vitamin E) .... Take 1 Capsule By Mouth Once A Day 22)  Co Q-10 30 Mg Caps (Coenzyme Q10) .... Take 1 Capsule By Mouth Once A Day 23)  Vit B .... Take 1 Tablet By Mouth Once A Day 24)  Mucinex Dm Maximum Strength 60-1200 Mg  Xr12h-Tab (Dextromethorphan-Guaifenesin) .... As Needed. 25)  Nasonex 50 Mcg/act Susp (Mometasone Furoate) .... One Spray in Each Nostril Once Daily As Needed. 26)  Voltaren 1 % Gel (Diclofenac Sodium) .... Apply 4 G Qid As Needed 27)  Multigen Plus 50-101-1 Mg Tabs (Feasp-Fefum -Suc-C-Thre-B12-Fa) .Marland Kitchen.. 1 By Mouth Once Daily 28)  Lovaza 1 Gm Caps (Omega-3-Acid Ethyl Esters) .... 2 By Mouth Two Times A Day 29)  Silvadene 1 % Crea (Silver Sulfadiazine) .... As Directed 30)  Hydroxyzine Hcl 25 Mg Tabs (Hydroxyzine Hcl) .Marland Kitchen.. 1 By Mouth Every 4 Hrs As Needed  Allergies (verified): 1)  ! Ampicillin (Ampicillin) 2)  !  Synvisc (Hylan)  Past History:  Past Medical History: Reviewed history from 05/07/2010 and no changes required. Allergic rhinitis Diabetes mellitus, type II Hyperlipidemia Hypertension Osteopenia Peripheral neuropathy OSA      - PSG 03/05/10 RDI 9, REM predominant      - CPAP 10 cm H2O  Past Surgical History: Reviewed history from 01/05/2009 and no changes required. STRESS TEST 09/2002, 07/09/2004 No Orthopedic surgery Hysterectomy and L ovary  Review of Systems  The patient denies productive cough, non-productive cough, weight change, difficulty swallowing, sore throat, headaches, nasal congestion/difficulty breathing through nose, and hand/feet swelling.      Vital Signs:  Patient profile:   70 year old female Height:      64 inches (162.56 cm) Weight:      206.50 pounds (93.86 kg) BMI:     35.57 O2 Sat:      97 % on Room air Temp:     98.2 degrees F (36.78 degrees C) oral Pulse rate:   97 / minute BP sitting:   160 / 72  (left arm) Cuff size:   large  Vitals Entered By: Michel Bickers CMA (August 26, 2010 9:52 AM)  O2 Sat at Rest %:  97 O2 Flow:  Room air CC: OSA follow-up.  the patient says she finally got the "kinks"  worked out  with her cpap machine and it has been working properly for approx 1 month. She does feel less tired during the day and her quality of sleep is better. Comments Medications reviewed with the patient. Daytime phone verified. Michel Bickers [facility]  August 26, 2010 9:53 AM   Physical Exam  General:  obese.   Nose:  clear nasal discharge.   Mouth:  MP 4, enlarged tongue Neck:  no JVD.   Lungs:  clear bilaterally to auscultation and percussion Heart:  regular rate and rhythm, S1, S2 without murmurs, rubs, gallops, or clicks Extremities:  no clubbing, cyanosis, edema, or deformity noted Neurologic:  normal CN II-XII and strength normal.   Cervical Nodes:  no significant adenopathy Psych:  alert and cooperative; normal mood and affect; normal attention span and concentration   Impression & Recommendations:  Problem # 1:  OBSTRUCTIVE SLEEP APNEA (ICD-327.23) Will arrange for her CPAP mask to be adjusted by the techs at the sleep lab.  Encouraged her to continue with CPAP, and to continue with weight loss.  Complete Medication List: 1)  Hydrocodone-acetaminophen 7.5-750 Mg Tabs (Hydrocodone-acetaminophen) .Marland Kitchen.. 1 by mouth every 6 hours as needed 2)  Actos 45 Mg Tabs (Pioglitazone hcl) .... Take one tablet daily 3)  Lyrica 75 Mg Caps (Pregabalin) .... Take 1 capsule by mouth twice daily 4)  Quinapril Hcl 40 Mg Tabs (Quinapril hcl) .... Take one tablet daily 5)  Omeprazole 20 Mg Tbec (Omeprazole) .... Take one  tablet daily 6)  Singulair 10 Mg Tabs (Montelukast sodium) .... Take one tablet daily 7)  Estradiol 1 Mg Tabs (Estradiol) .... Take one tablet daily 8)  Torsemide 20 Mg Tabs (Torsemide) .... Take one tablet daily 9)  Zyrtec Allergy 10 Mg Tabs (Cetirizine hcl) .... Take 1 tablet by mouth once a day 10)  Qvar 80 Mcg/act Aers (Beclomethasone dipropionate) .... Use once daily as directed 11)  Maxair Autohaler 200 Mcg/inh Aerb (Pirbuterol acetate) .... One puff as needed 12)  Patanol 0.1 % Soln (Olopatadine hcl) .... Use once daily 13)  Caltrate 600 1500 Mg Tabs (Calcium carbonate) .... One tablet daily 14)  Aspirin 81 Mg Tbec (Aspirin) .Marland KitchenMarland KitchenMarland Kitchen  One tablet daily 15)  Amlodipine Besylate 5 Mg Tabs (Amlodipine besylate) .... Take one tablet daily 16)  Vitamin D 56213 Unit Caps (Ergocalciferol) .Marland Kitchen.. 1 cap 2 times a week 17)  Nasacort Aq 55 Mcg/act Aers (Triamcinolone acetonide(nasal)) .... 2 sprays each nostril once daily 18)  Stool Softener 100 Mg Caps (Docusate sodium) .... Three capsules by mouth daily 19)  Fiberchoice 2 Gm Chew (Inulin) .... Two tablets by mouth daily 20)  Omega-3 350 Mg Caps (Omega-3 fatty acids) .... One tablet by mouth once daily 21)  Vitamin E 200 Unit Caps (Vitamin e) .... Take 1 capsule by mouth once a day 22)  Co Q-10 30 Mg Caps (Coenzyme q10) .... Take 1 capsule by mouth once a day 23)  Vit B  .... Take 1 tablet by mouth once a day 24)  Mucinex Dm Maximum Strength 60-1200 Mg Xr12h-tab (Dextromethorphan-guaifenesin) .... As needed. 25)  Nasonex 50 Mcg/act Susp (Mometasone furoate) .... One spray in each nostril once daily as needed. 26)  Voltaren 1 % Gel (Diclofenac sodium) .... Apply 4 g qid as needed 27)  Multigen Plus 50-101-1 Mg Tabs (Feasp-fefum -suc-c-thre-b12-fa) .Marland Kitchen.. 1 by mouth once daily 28)  Lovaza 1 Gm Caps (Omega-3-acid ethyl esters) .... 2 by mouth two times a day 29)  Silvadene 1 % Crea (Silver sulfadiazine) .... As directed 30)  Hydroxyzine Hcl 25 Mg Tabs  (Hydroxyzine hcl) .Marland Kitchen.. 1 by mouth every 4 hrs as needed  Other Orders: Est. Patient Level III (08657) Sleep Disorder Referral (Sleep Disorder)  Patient Instructions: 1)  Will arrange for mask fit evaluation at sleep lab 2)  Follow up in 6 months

## 2011-01-28 NOTE — Assessment & Plan Note (Signed)
Summary: LEFT EYE=PINK EYE?, NEEDS LABS, DISCUSS MED BEFORE MRI///SPH   Vital Signs:  Patient profile:   69 year old female Weight:      209.2 pounds Temp:     98.5 degrees F oral BP sitting:   146 / 72  (left arm)  Vitals Entered By: Almeta Monas CMA Duncan Dull) (September 04, 2010 9:59 AM) CC: x6days c/o redness and itching to the left eye  Vision Screening:Left eye with correction: 20 / 40 Right eye with correction: 20 / 25 Both eyes with correction: 20 / 25        Vision Entered By: Almeta Monas CMA (AAMA) (September 04, 2010 10:00 AM) Flu Vaccine Consent Questions     Do you have a history of severe allergic reactions to this vaccine? no    Any prior history of allergic reactions to egg and/or gelatin? no    Do you have a sensitivity to the preservative Thimersol? no    Do you have a past history of Guillan-Barre Syndrome? no    Do you currently have an acute febrile illness? no    Have you ever had a severe reaction to latex? no    Vaccine information given and explained to patient? yes    Are you currently pregnant? no    Lot Number:[id number]   Exp Date:06/27/2010   Site Given  Left Deltoid IM   History of Present Illness: Pt here c/o pink eye that is spreading to R eye x 3 days.   Eyes are itchy.   Pt also needs labs done and has MRI spine from ortho that showed hepatic cysts.  --- Pt had Liver mri about 2 years ago-----they were benign hemangiomas.    Current Medications (verified): 1)  Hydrocodone-Acetaminophen 7.5-750 Mg Tabs (Hydrocodone-Acetaminophen) .Marland Kitchen.. 1 By Mouth Every 6 Hours As Needed 2)  Actos 45 Mg Tabs (Pioglitazone Hcl) .... Take One Tablet Daily 3)  Lyrica 75 Mg  Caps (Pregabalin) .... Take 1 Capsule By Mouth Twice Daily 4)  Quinapril Hcl 40 Mg  Tabs (Quinapril Hcl) .... Take One Tablet Daily 5)  Omeprazole 20 Mg  Tbec (Omeprazole) .... Take One Tablet Daily 6)  Singulair 10 Mg  Tabs (Montelukast Sodium) .... Take One Tablet Daily 7)  Estradiol 1  Mg  Tabs (Estradiol) .... Take One Tablet Daily 8)  Torsemide 20 Mg  Tabs (Torsemide) .... Take One Tablet Daily 9)  Zyrtec Allergy 10 Mg Tabs (Cetirizine Hcl) .... Take 1 Tablet By Mouth Once A Day 10)  Qvar 80 Mcg/act  Aers (Beclomethasone Dipropionate) .... Use Once Daily As Directed 11)  Maxair Autohaler 200 Mcg/inh  Aerb (Pirbuterol Acetate) .... One Puff As Needed 12)  Patanol 0.1 %  Soln (Olopatadine Hcl) .... Use Once Daily 13)  Caltrate 600 1500 Mg  Tabs (Calcium Carbonate) .... One Tablet Daily 14)  Aspirin 81 Mg  Tbec (Aspirin) .... One Tablet Daily 15)  Amlodipine Besylate 5 Mg  Tabs (Amlodipine Besylate) .... Take One Tablet Daily 16)  Vitamin D 75643 Unit  Caps (Ergocalciferol) .Marland Kitchen.. 1 Cap 2 Times A Week 17)  Nasacort Aq 55 Mcg/act  Aers (Triamcinolone Acetonide(Nasal)) .... 2 Sprays Each Nostril Once Daily 18)  Stool Softener 100 Mg Caps (Docusate Sodium) .... Three Capsules By Mouth Daily 19)  Fiberchoice 2 Gm Chew (Inulin) .... Two Tablets By Mouth Daily 20)  Omega-3 350 Mg Caps (Omega-3 Fatty Acids) .... One Tablet By Mouth Once Daily 21)  Vitamin E 200 Unit Caps (  Vitamin E) .... Take 1 Capsule By Mouth Once A Day 22)  Co Q-10 30 Mg Caps (Coenzyme Q10) .... Take 1 Capsule By Mouth Once A Day 23)  Vit B .... Take 1 Tablet By Mouth Once A Day 24)  Mucinex Dm Maximum Strength 60-1200 Mg  Xr12h-Tab (Dextromethorphan-Guaifenesin) .... As Needed. 25)  Nasonex 50 Mcg/act Susp (Mometasone Furoate) .... One Spray in Each Nostril Once Daily As Needed. 26)  Voltaren 1 % Gel (Diclofenac Sodium) .... Apply 4 G Qid As Needed 27)  Multigen Plus 50-101-1 Mg Tabs (Feasp-Fefum -Suc-C-Thre-B12-Fa) .Marland Kitchen.. 1 By Mouth Once Daily 28)  Lovaza 1 Gm Caps (Omega-3-Acid Ethyl Esters) .... 2 By Mouth Two Times A Day 29)  Silvadene 1 % Crea (Silver Sulfadiazine) .... As Directed 30)  Hydroxyzine Hcl 25 Mg Tabs (Hydroxyzine Hcl) .Marland Kitchen.. 1 By Mouth Every 4 Hrs As Needed 31)  Vigamox 0.5 % Soln (Moxifloxacin Hcl)  .Marland Kitchen.. 1 Gtt Both Eyes Three Times A Day  Allergies (verified): 1)  ! Ampicillin (Ampicillin) 2)  ! Synvisc (Hylan)  Past History:  Past Medical History: Last updated: 05/07/2010 Allergic rhinitis Diabetes mellitus, type II Hyperlipidemia Hypertension Osteopenia Peripheral neuropathy OSA      - PSG 03/05/10 RDI 9, REM predominant      - CPAP 10 cm H2O  Past Surgical History: Last updated: 01/05/2009 STRESS TEST 09/2002, 07/09/2004 No Orthopedic surgery Hysterectomy and L ovary  Family History: Last updated: 12/04/2008 Lung cancer-father brother-bone cancer multiple myeloma  Family History of Colon Cancer:Uncle and 2 first cousins  Social History: Last updated: 01/28/2010 Patient has never smoked.  Alcohol Use - no Daily Caffeine Use Occupation: Retired  Risk Factors: Alcohol Use: 0 (02/20/2010)  Risk Factors: Smoking Status: never (02/20/2010)  Family History: Reviewed history from 12/04/2008 and no changes required. Lung cancer-father brother-bone cancer multiple myeloma  Family History of Colon Cancer:Uncle and 2 first cousins  Social History: Reviewed history from 01/28/2010 and no changes required. Patient has never smoked.  Alcohol Use - no Daily Caffeine Use Occupation: Retired  Review of Systems      See HPI  Physical Exam  General:  Well-developed,well-nourished,in no acute distress; alert,appropriate and cooperative throughout examination Eyes:  L eye---conjunctiva red and teary R eye pink--no d/c  Neck:  No deformities, masses, or tenderness noted. Lungs:  Normal respiratory effort, chest expands symmetrically. Lungs are clear to auscultation, no crackles or wheezes. Psych:  Cognition and judgment appear intact. Alert and cooperative with normal attention span and concentration. No apparent delusions, illusions, hallucinations   Impression & Recommendations:  Problem # 1:  UNSPECIFIED CONJUNCTIVITIS (ICD-372.30)  Her updated  medication list for this problem includes:    Vigamox 0.5 % Soln (Moxifloxacin hcl) .Marland Kitchen... 1 gtt both eyes three times a day  Discussed treatment, and urged patient to wash hands carefully after touching face.   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) check labs today---ortho concerned about meds she is on MRI liver done by GI negative and pt with no new symptoms Will make sure GI sees MRI done by ortho---reassured pt we are check the liver in her blood several times a year Pt very concerned about liver cysts  Problem # 3:  VITAMIN D DEFICIENCY (ICD-268.9)  Orders: Venipuncture (14782) TLB-Lipid Panel (80061-LIPID) TLB-BMP (Basic Metabolic Panel-BMET) (80048-METABOL) TLB-CBC Platelet - w/Differential (85025-CBCD) TLB-Hepatic/Liver Function Pnl (80076-HEPATIC) TLB-A1C / Hgb A1C (Glycohemoglobin) (83036-A1C) TLB-Microalbumin/Creat Ratio, Urine (82043-MALB) T-Vitamin D (25-Hydroxy) (95621-30865)  Problem # 4:  LEUKOCYTOPENIA UNSPECIFIED (ICD-288.50)  Problem # 5:  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 1 by mouth once daily  Orders: Venipuncture (63875) TLB-Lipid Panel (80061-LIPID) TLB-BMP (Basic Metabolic Panel-BMET) (80048-METABOL) TLB-CBC Platelet - w/Differential (85025-CBCD) TLB-Hepatic/Liver Function Pnl (80076-HEPATIC) TLB-A1C / Hgb A1C (Glycohemoglobin) (83036-A1C) TLB-Microalbumin/Creat Ratio, Urine (82043-MALB) T-Vitamin D (25-Hydroxy) (64332-95188)  Problem # 6:  OSTEOPENIA (ICD-733.90)  Her updated medication list for this problem includes:    Caltrate 600 1500 Mg Tabs (Calcium carbonate) ..... One tablet daily    Vitamin D 41660 Unit Caps (Ergocalciferol) .Marland Kitchen... 1 cap 2 times a week  Orders: Venipuncture (63016) TLB-Lipid Panel (80061-LIPID) TLB-BMP (Basic Metabolic Panel-BMET) (80048-METABOL) TLB-CBC Platelet -  w/Differential (85025-CBCD) TLB-Hepatic/Liver Function Pnl (80076-HEPATIC) TLB-A1C / Hgb A1C (Glycohemoglobin) (83036-A1C) TLB-Microalbumin/Creat Ratio, Urine (82043-MALB) T-Vitamin D (25-Hydroxy) (01093-23557)  Problem # 7:  HYPERLIPIDEMIA (ICD-272.4)  Her updated medication list for this problem includes:    Lovaza 1 Gm Caps (Omega-3-acid ethyl esters) .Marland Kitchen... 2 by mouth two times a day  Orders: Venipuncture (32202) TLB-Lipid Panel (80061-LIPID) TLB-BMP (Basic Metabolic Panel-BMET) (80048-METABOL) TLB-CBC Platelet - w/Differential (85025-CBCD) TLB-Hepatic/Liver Function Pnl (80076-HEPATIC) TLB-A1C / Hgb A1C (Glycohemoglobin) (83036-A1C) TLB-Microalbumin/Creat Ratio, Urine (82043-MALB) T-Vitamin D (25-Hydroxy) (54270-62376)  Problem # 8:  HYPERTENSION (ICD-401.9)  Her updated medication list for this problem includes:    Quinapril Hcl 40 Mg Tabs (Quinapril hcl) .Marland Kitchen... Take one tablet daily    Torsemide 20 Mg Tabs (Torsemide) .Marland Kitchen... Take one tablet daily    Amlodipine Besylate 5 Mg Tabs (Amlodipine besylate) .Marland Kitchen... Take one tablet daily  Orders: Venipuncture (28315) TLB-Lipid Panel (80061-LIPID) TLB-BMP (Basic Metabolic Panel-BMET) (80048-METABOL) TLB-CBC Platelet - w/Differential (85025-CBCD) TLB-Hepatic/Liver Function Pnl (80076-HEPATIC) TLB-A1C / Hgb A1C (Glycohemoglobin) (83036-A1C) TLB-Microalbumin/Creat Ratio, Urine (82043-MALB) T-Vitamin D (25-Hydroxy) (17616-07371)  Problem # 9:  DIABETES MELLITUS, TYPE II (ICD-250.00)  Her updated medication list for this problem includes:    Actos 45 Mg Tabs (Pioglitazone hcl) .Marland Kitchen... Take one tablet daily    Quinapril Hcl 40 Mg Tabs (Quinapril hcl) .Marland Kitchen... Take one tablet daily    Aspirin 81 Mg Tbec (Aspirin) ..... One tablet daily  Orders: Venipuncture (06269) TLB-Lipid Panel (80061-LIPID) TLB-BMP (Basic Metabolic Panel-BMET) (80048-METABOL) TLB-CBC Platelet - w/Differential (85025-CBCD) TLB-Hepatic/Liver Function Pnl  (80076-HEPATIC) TLB-A1C / Hgb A1C (Glycohemoglobin) (83036-A1C) TLB-Microalbumin/Creat Ratio, Urine (82043-MALB) T-Vitamin D (25-Hydroxy) (48546-27035)  Labs Reviewed: Creat: 0.7 (11/15/2009)    Reviewed HgBA1c results: 6.0 (11/15/2009)  6.3 (03/26/2009)  Complete Medication List: 1)  Hydrocodone-acetaminophen 7.5-750 Mg Tabs (Hydrocodone-acetaminophen) .Marland Kitchen.. 1 by mouth every 6 hours as needed 2)  Actos 45 Mg Tabs (Pioglitazone hcl) .... Take one tablet daily 3)  Lyrica 75 Mg Caps (Pregabalin) .... Take 1 capsule by mouth twice daily 4)  Quinapril Hcl 40 Mg Tabs (Quinapril hcl) .... Take one tablet daily 5)  Omeprazole 20 Mg Tbec (Omeprazole) .... Take one tablet daily 6)  Singulair 10 Mg Tabs (Montelukast sodium) .... Take one tablet daily 7)  Estradiol 1 Mg Tabs (Estradiol) .... Take one tablet daily 8)  Torsemide 20 Mg Tabs (Torsemide) .... Take one tablet daily 9)  Zyrtec Allergy 10 Mg Tabs (Cetirizine hcl) .... Take 1 tablet by mouth once a day 10)  Qvar 80 Mcg/act Aers (Beclomethasone dipropionate) .... Use once daily as directed 11)  Maxair Autohaler 200 Mcg/inh Aerb (Pirbuterol acetate) .... One puff as needed 12)  Patanol 0.1 % Soln (Olopatadine hcl) .... Use once daily 13)  Caltrate 600 1500 Mg Tabs (Calcium carbonate) .... One tablet daily 14)  Aspirin 81 Mg Tbec (Aspirin) .... One tablet daily 15)  Amlodipine Besylate 5 Mg Tabs (Amlodipine besylate) .... Take one tablet daily 16)  Vitamin D 16109 Unit Caps (Ergocalciferol) .Marland Kitchen.. 1 cap 2 times a week 17)  Nasacort Aq 55 Mcg/act Aers (Triamcinolone acetonide(nasal)) .... 2 sprays each nostril once daily 18)  Stool Softener 100 Mg Caps (Docusate sodium) .... Three capsules by mouth daily 19)  Fiberchoice 2 Gm Chew (Inulin) .... Two tablets by mouth daily 20)  Omega-3 350 Mg Caps (Omega-3 fatty acids) .... One tablet by mouth once daily 21)  Vitamin E 200 Unit Caps (Vitamin e) .... Take 1 capsule by mouth once a day 22)  Co Q-10  30 Mg Caps (Coenzyme q10) .... Take 1 capsule by mouth once a day 23)  Vit B  .... Take 1 tablet by mouth once a day 24)  Mucinex Dm Maximum Strength 60-1200 Mg Xr12h-tab (Dextromethorphan-guaifenesin) .... As needed. 25)  Nasonex 50 Mcg/act Susp (Mometasone furoate) .... One spray in each nostril once daily as needed. 26)  Voltaren 1 % Gel (Diclofenac sodium) .... Apply 4 g qid as needed 27)  Multigen Plus 50-101-1 Mg Tabs (Feasp-fefum -suc-c-thre-b12-fa) .Marland Kitchen.. 1 by mouth once daily 28)  Lovaza 1 Gm Caps (Omega-3-acid ethyl esters) .... 2 by mouth two times a day 29)  Silvadene 1 % Crea (Silver sulfadiazine) .... As directed 30)  Hydroxyzine Hcl 25 Mg Tabs (Hydroxyzine hcl) .Marland Kitchen.. 1 by mouth every 4 hrs as needed 31)  Vigamox 0.5 % Soln (Moxifloxacin hcl) .Marland Kitchen.. 1 gtt both eyes three times a day  Other Orders: Flu Vaccine 67yrs + (60454) Administration Flu vaccine - MCR (U9811)  Patient Instructions: 1)  f/u 2-3 weeks  Prescriptions: VIGAMOX 0.5 % SOLN (MOXIFLOXACIN HCL) 1 gtt both eyes three times a day  #7 days x 0   Entered and Authorized by:   Loreen Freud DO   Signed by:   Loreen Freud DO on 09/04/2010   Method used:   Electronically to        [id number] (retail)       [address] [place], [place]  [address]       Ph: [phone]       Fax: [phone]   RxID:   [id number]

## 2011-01-28 NOTE — Progress Notes (Signed)
Summary: Refill Request  Phone Note Refill Request Message from:  Pharmacy on [facility] [address] Fax #: [phone]  Refills Requested: Medication #1:  HYDROCODONE-ACETAMINOPHEN 7.5-750 MG TABS 1 by mouth every 6 hours as needed   Dosage confirmed as above?Dosage Confirmed   Brand Name Necessary? No   Supply Requested: 1 month   Last Refilled: 01/21/2010 Next Appointment Scheduled: none Initial call taken by: Harold Barban,  March 19, 2010 8:41 AM  Follow-up for Phone Call        last ov 03/07/09. Army Fossa CMA  March 19, 2010 8:49 AM   Additional Follow-up for Phone Call Additional follow up Details #1::        OK TO REFILL X1 Additional Follow-up by: Loreen Freud DO,  March 19, 2010 8:50 AM    Prescriptions: HYDROCODONE-ACETAMINOPHEN 7.5-750 MG TABS (HYDROCODONE-ACETAMINOPHEN) 1 by mouth every 6 hours as needed  #120 x 0   Entered by:   Army Fossa CMA   Authorized by:   Loreen Freud DO   Signed by:   Army Fossa CMA on 03/19/2010   Method used:   Printed then faxed to ...       [facility] [address] [facility] [address] (retail)       [address] [place], [place]  [address]       Ph: [phone]       Fax: [phone]   RxID:   [id number]

## 2011-01-28 NOTE — Progress Notes (Signed)
Summary: Refill Request  Phone Note Refill Request Call back at [phone] Message from:  Pharmacy on June 07, 2010 9:10 AM  Refills Requested: Medication #1:  HYDROCODONE-ACETAMINOPHEN 7.5-750 MG TABS 1 by mouth every 6 hours as needed   Dosage confirmed as above?Dosage Confirmed   Supply Requested: 3 months   Last Refilled: 05/02/2010 [facility] [address]  Next Appointment Scheduled: none Initial call taken by: Harold Barban,  June 07, 2010 9:10 AM  Follow-up for Phone Call        last ov- 03/07/10. Army Fossa CMA  June 07, 2010 9:13 AM   Additional Follow-up for Phone Call Additional follow up Details #1::        refill x1 Additional Follow-up by: Loreen Freud DO,  June 07, 2010 12:04 PM    Prescriptions: HYDROCODONE-ACETAMINOPHEN 7.5-750 MG TABS (HYDROCODONE-ACETAMINOPHEN) 1 by mouth every 6 hours as needed  #120 x 0   Entered and Authorized by:   Loreen Freud DO   Signed by:   Loreen Freud DO on 06/07/2010   Method used:   Print then Give to Patient   RxID:   [id number]
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
## 2011-01-28 NOTE — Assessment & Plan Note (Signed)
Summary: F/U Abd pain, bloating, nausea, saw NP    History of Present Illness Visit Type: follow up  Primary GI MD: Melvia Heaps MD [facility] Primary Provider: Loreen Freud, DO Requesting Provider: n/a Chief Complaint: F/u for abd pain and bloating. Pt c/o lower abd pain but denies any other GI complaints. Pt wants refill on Dicyclomine History of Present Illness:   Ms. Albaugh has returned for ongoing evaluation of nausea and abdominal pain.  Her nausea actually has improved since changing to a regimen of taking her pills after eating a full meal.  She complains of mild postprandial nausea.  She still has epigastric pain that may be exacerbated postprandially.  She complains of regurgitation of gastric contents and occasional pyrosis.   GI Review of Systems    Reports abdominal pain.     Location of  Abdominal pain: lower abdomen.    Denies acid reflux, belching, bloating, chest pain, dysphagia with liquids, dysphagia with solids, heartburn, loss of appetite, nausea, vomiting, vomiting blood, weight loss, and  weight gain.        Denies anal fissure, black tarry stools, change in bowel habit, constipation, diarrhea, diverticulosis, fecal incontinence, heme positive stool, hemorrhoids, irritable bowel syndrome, jaundice, light color stool, liver problems, rectal bleeding, and  rectal pain.    Current Medications (verified): 1)  Hydrocodone-Acetaminophen 7.5-750 Mg Tabs (Hydrocodone-Acetaminophen) .Marland Kitchen.. 1 By Mouth Every 6 Hours As Needed 2)  Actos 45 Mg Tabs (Pioglitazone Hcl) .... Take One Tablet Daily 3)  Lyrica 75 Mg  Caps (Pregabalin) .... Take 1 Capsule By Mouth Twice Daily 4)  Quinapril Hcl 40 Mg  Tabs (Quinapril Hcl) .... Take One Tablet Daily 5)  Omeprazole 20 Mg  Tbec (Omeprazole) .... Take One Tablet Daily 6)  Singulair 10 Mg  Tabs (Montelukast Sodium) .... Take One Tablet Daily 7)  Estradiol 1 Mg  Tabs (Estradiol) .... Take One Tablet Daily 8)  Torsemide 20 Mg  Tabs (Torsemide)  .... Take One Tablet Daily 9)  Zyrtec Allergy 10 Mg Tabs (Cetirizine Hcl) .... Take 1 Tablet By Mouth Once A Day 10)  Qvar 80 Mcg/act  Aers (Beclomethasone Dipropionate) .... Use Once Daily As Directed 11)  Maxair Autohaler 200 Mcg/inh  Aerb (Pirbuterol Acetate) .... One Puff As Needed 12)  Patanol 0.1 %  Soln (Olopatadine Hcl) .... Use Once Daily 13)  Caltrate 600 1500 Mg  Tabs (Calcium Carbonate) .... One Tablet Daily 14)  Aspirin 81 Mg  Tbec (Aspirin) .... One Tablet Daily 15)  Amlodipine Besylate 5 Mg  Tabs (Amlodipine Besylate) .... Take One Tablet Daily 16)  Vitamin D [address] Unit  Caps (Ergocalciferol) .Marland Kitchen.. 1 Cap 2 Times A Week 17)  Nasacort Aq 55 Mcg/act  Aers (Triamcinolone Acetonide(Nasal)) .... 2 Sprays Each Nostril Once Daily 18)  Stool Softener 100 Mg Caps (Docusate Sodium) .... Three Capsules By Mouth Daily 19)  Fiberchoice 2 Gm Chew (Inulin) .... Two Tablets By Mouth Daily 20)  Omega-3 350 Mg Caps (Omega-3 Fatty Acids) .... One Tablet By Mouth Once Daily 21)  Vitamin E 200 Unit Caps (Vitamin E) .... Take 1 Capsule By Mouth Once A Day 22)  Co Q-10 30 Mg Caps (Coenzyme Q10) .... Take 1 Capsule By Mouth Once A Day 23)  Vit B .... Take 1 Tablet By Mouth Once A Day 24)  Mucinex Dm Maximum Strength 60-1200 Mg  Xr12h-Tab (Dextromethorphan-Guaifenesin) .... As Needed. 25)  Nasonex 50 Mcg/act Susp (Mometasone Furoate) .... One Spray in Each Nostril Once Daily As Needed.  26)  Voltaren 1 % Gel (Diclofenac Sodium) .... Apply 4 G Qid As Needed 27)  Multigen Plus 50-101-1 Mg Tabs (Feasp-Fefum -Suc-C-Thre-B12-Fa) .Marland Kitchen.. 1 By Mouth Once Daily 28)  Lovaza 1 Gm Caps (Omega-3-Acid Ethyl Esters) .... 2 By Mouth Two Times A Day 29)  Silvadene 1 % Crea (Silver Sulfadiazine) .... As Directed 30)  Hydroxyzine Hcl 25 Mg Tabs (Hydroxyzine Hcl) .Marland Kitchen.. 1 By Mouth Every 4 Hrs As Needed 31)  Metrogel-Vaginal 0.75 % Gel (Metronidazole) 32)  Bactrim Ds 800-160 Mg Tabs (Sulfamethoxazole-Trimethoprim) .Marland Kitchen.. 1 By Mouth  Two Times A Day  Allergies (verified): 1)  ! Ampicillin (Ampicillin) 2)  ! Synvisc (Hylan)  Past History:  Past Medical History: Reviewed history from 04/24/2008 and no changes required. Allergic rhinitis Diabetes mellitus, type II Hyperlipidemia Hypertension Osteopenia Peripheral neuropathy  Past Surgical History: Reviewed history from 01/05/2009 and no changes required. STRESS TEST 09/2002, 07/09/2004 No Orthopedic surgery Hysterectomy and L ovary  Family History: Reviewed history from 12/04/2008 and no changes required. Lung cancer-father brother-bone cancer multiple myeloma  Family History of Colon Cancer:Uncle and 2 first cousins  Social History: Patient has never smoked.  Alcohol Use - no Daily Caffeine Use Occupation: Retired  Review of Systems  The patient denies allergy/sinus, anemia, anxiety-new, arthritis/joint pain, back pain, blood in urine, breast changes/lumps, change in vision, confusion, cough, coughing up blood, depression-new, fainting, fatigue, fever, headaches-new, hearing problems, heart murmur, heart rhythm changes, itching, menstrual pain, muscle pains/cramps, night sweats, nosebleeds, pregnancy symptoms, shortness of breath, skin rash, sleeping problems, sore throat, swelling of feet/legs, swollen lymph glands, thirst - excessive , urination - excessive , urination changes/pain, urine leakage, vision changes, and voice change.    Vital Signs:  Patient profile:   70 year old female Height:      64 inches Weight:      203 pounds BMI:     34.97 BSA:     1.97 Pulse rate:   76 / minute Pulse rhythm:   regular BP sitting:   132 / 80  (left arm) Cuff size:   regular  Vitals Entered By: Ok Anis CMA (January 28, 2010 2:16 PM)   Impression & Recommendations:  Problem # 1:  ABDOMINAL BLOATING (ICD-787.3)  The patient has dyspeptic symptoms that could be  related to ulcer or nonulcer disease.  Gastroparesis also is a possibility.  Medications  may be contributing as well.  Recommendations #1 switch from omeprazole to AcipHex 20 mg daily #2 upper endoscopy #3 gastric emptying scan if symptoms are not improved and endoscopy is  Orders: EGD (EGD)  Patient Instructions: 1)  We gave you Aciphex samples today 2)  Your EGD is scheduled for 01/29/2010 at 4pm 3)  The medication list was reviewed and reconciled.  All changed / newly prescribed medications were explained.  A complete medication list was provided to the patient / caregiver.

## 2011-01-28 NOTE — Progress Notes (Signed)
Summary: GES Scheduled   Phone Note Outgoing Call Call back at [facility] Phone [phone]   Call placed by: Merri Ray CMA Duncan Dull),  January 30, 2010 8:54 AM Action Taken: Appt scheduled Summary of Call: Scheuled pt GES for 02/08/2010 at 9am at [facility]. Tried to call pt to inform, no answer Initial call taken by: Merri Ray CMA Duncan Dull),  January 30, 2010 8:54 AM  Follow-up for Phone Call        Called and informed pt of her test that has been scheduled. Went over instructions with pt. Follow-up by: Merri Ray CMA Duncan Dull),  January 30, 2010 1:53 PM

## 2011-01-28 NOTE — Miscellaneous (Signed)
Summary: CPAP download 04/10/10 to 04/29/10   Clinical Lists Changes Optimal pressure 10 cm H2O with average AHI 2.3.  Used on 18 of 20 days with median 4 hours per night.

## 2011-01-28 NOTE — Letter (Signed)
Summary: Appt Reminder 2  [facility]  [address] [place], [place] [address]   Phone: [phone]  Fax: [phone]        January 30, 2010 MRN: [id number]    Stacey Leon [address] [place], [place]  [address]    Dear Ms. Weiand,   You have a return appointment with Dr.Bernie Ransford Arlyce Dice on 02-22-10 at 3pm. Please remember to bring a complete list of the medicines you are taking, your insurance card and your co-pay.  If you have to cancel or reschedule this appointment, please call before 5:00 pm the evening before to avoid a cancellation fee.  If you have any questions or concerns, please call [phone].    Sincerely,    Laureen Ochs LPN  Appended Document: Appt Reminder 2 Letter mailed to patient.

## 2011-01-28 NOTE — Assessment & Plan Note (Signed)
Summary: POST ENDO. AND GES                 DEBORAH    History of Present Illness Visit Type: Follow-up Visit Primary GI MD: Melvia Heaps MD [facility] Primary Provider: Loreen Freud, DO Requesting Provider: n/a Chief Complaint: Patient here to follow up after her GES and EGD. She complains of nausea that is not constant and some generalized abdominal pain.  History of Present Illness:   Ms. Wyer has returned for followup of her nausea and abdominal discomfort. This has been an intermittent problem.  She frequently has break through pyrosis.  She has taken dicyclomine with partial relief.  Insurance will not pay for hyomax.  Her reflux symptoms clearly were not improved with AcipHex but she only has partial coverage for AcipHex and takes omeprazole daily. Recent EGD and  gastric emptying scan were unremarkable.  She never took metoclopramide.   GI Review of Systems    Reports abdominal pain and  nausea.     Location of  Abdominal pain: generalized.    Denies acid reflux, belching, bloating, chest pain, dysphagia with liquids, dysphagia with solids, heartburn, loss of appetite, vomiting, vomiting blood, weight loss, and  weight gain.        Denies anal fissure, black tarry stools, change in bowel habit, constipation, diarrhea, diverticulosis, fecal incontinence, heme positive stool, hemorrhoids, irritable bowel syndrome, jaundice, light color stool, liver problems, rectal bleeding, and  rectal pain.    Current Medications (verified): 1)  Hydrocodone-Acetaminophen 7.5-750 Mg Tabs (Hydrocodone-Acetaminophen) .Marland Kitchen.. 1 By Mouth Every 6 Hours As Needed 2)  Actos 45 Mg Tabs (Pioglitazone Hcl) .... Take One Tablet Daily 3)  Lyrica 75 Mg  Caps (Pregabalin) .... Take 1 Capsule By Mouth Twice Daily 4)  Quinapril Hcl 40 Mg  Tabs (Quinapril Hcl) .... Take One Tablet Daily 5)  Omeprazole 20 Mg  Tbec (Omeprazole) .... Take One Tablet Daily 6)  Singulair 10 Mg  Tabs (Montelukast Sodium) .... Take One Tablet  Daily 7)  Estradiol 1 Mg  Tabs (Estradiol) .... Take One Tablet Daily 8)  Torsemide 20 Mg  Tabs (Torsemide) .... Take One Tablet Daily 9)  Zyrtec Allergy 10 Mg Tabs (Cetirizine Hcl) .... Take 1 Tablet By Mouth Once A Day 10)  Qvar 80 Mcg/act  Aers (Beclomethasone Dipropionate) .... Use Once Daily As Directed 11)  Maxair Autohaler 200 Mcg/inh  Aerb (Pirbuterol Acetate) .... One Puff As Needed 12)  Patanol 0.1 %  Soln (Olopatadine Hcl) .... Use Once Daily 13)  Caltrate 600 1500 Mg  Tabs (Calcium Carbonate) .... One Tablet Daily 14)  Aspirin 81 Mg  Tbec (Aspirin) .... One Tablet Daily 15)  Amlodipine Besylate 5 Mg  Tabs (Amlodipine Besylate) .... Take One Tablet Daily 16)  Vitamin D [address] Unit  Caps (Ergocalciferol) .Marland Kitchen.. 1 Cap 2 Times A Week 17)  Nasacort Aq 55 Mcg/act  Aers (Triamcinolone Acetonide(Nasal)) .... 2 Sprays Each Nostril Once Daily 18)  Stool Softener 100 Mg Caps (Docusate Sodium) .... Three Capsules By Mouth Daily 19)  Fiberchoice 2 Gm Chew (Inulin) .... Two Tablets By Mouth Daily 20)  Omega-3 350 Mg Caps (Omega-3 Fatty Acids) .... One Tablet By Mouth Once Daily 21)  Vitamin E 200 Unit Caps (Vitamin E) .... Take 1 Capsule By Mouth Once A Day 22)  Co Q-10 30 Mg Caps (Coenzyme Q10) .... Take 1 Capsule By Mouth Once A Day 23)  Vit B .... Take 1 Tablet By Mouth Once A Day 24)  Mucinex Dm Maximum Strength 60-1200 Mg  Xr12h-Tab (Dextromethorphan-Guaifenesin) .... As Needed. 25)  Nasonex 50 Mcg/act Susp (Mometasone Furoate) .... One Spray in Each Nostril Once Daily As Needed. 26)  Voltaren 1 % Gel (Diclofenac Sodium) .... Apply 4 G Qid As Needed 27)  Multigen Plus 50-101-1 Mg Tabs (Feasp-Fefum -Suc-C-Thre-B12-Fa) .Marland Kitchen.. 1 By Mouth Once Daily 28)  Lovaza 1 Gm Caps (Omega-3-Acid Ethyl Esters) .... 2 By Mouth Two Times A Day 29)  Silvadene 1 % Crea (Silver Sulfadiazine) .... As Directed 30)  Hydroxyzine Hcl 25 Mg Tabs (Hydroxyzine Hcl) .Marland Kitchen.. 1 By Mouth Every 4 Hrs As Needed 31)   Metrogel-Vaginal 0.75 % Gel (Metronidazole)  Allergies (verified): 1)  ! Ampicillin (Ampicillin) 2)  ! Synvisc (Hylan)  Past History:  Past Medical History: Last updated: 04/24/2008 Allergic rhinitis Diabetes mellitus, type II Hyperlipidemia Hypertension Osteopenia Peripheral neuropathy  Past Surgical History: Last updated: 01/05/2009 STRESS TEST 09/2002, 07/09/2004 No Orthopedic surgery Hysterectomy and L ovary  Family History: Last updated: 12/04/2008 Lung cancer-father brother-bone cancer multiple myeloma  Family History of Colon Cancer:Uncle and 2 first cousins  Social History: Last updated: 01/28/2010 Patient has never smoked.  Alcohol Use - no Daily Caffeine Use Occupation: Retired  Review of Systems  The patient denies allergy/sinus, anemia, anxiety-new, arthritis/joint pain, back pain, blood in urine, breast changes/lumps, change in vision, confusion, cough, coughing up blood, depression-new, fainting, fatigue, fever, headaches-new, hearing problems, heart murmur, heart rhythm changes, itching, menstrual pain, muscle pains/cramps, night sweats, nosebleeds, pregnancy symptoms, shortness of breath, skin rash, sleeping problems, sore throat, swelling of feet/legs, swollen lymph glands, thirst - excessive , urination - excessive , urination changes/pain, urine leakage, vision changes, and voice change.    Vital Signs:  Patient profile:   70 year old female Height:      64 inches Weight:      205.4 pounds BMI:     35.38 Pulse rate:   88 / minute Pulse rhythm:   regular BP sitting:   140 / 74  (left arm) Cuff size:   regular  Vitals Entered By: Harlow Mares CMA Duncan Dull) (February 22, 2010 3:11 PM)   Impression & Recommendations:  Problem # 1:  ABDOMINAL BLOATING (ICD-787.3) Patient has nonulcer dyspepsia which has been fairly responsive to PPI therapy and anticholinergics.  Recommendation #1 l continue daily omeprazole and substitute AcipHex every 2-3  days #2 dicyclomine p.r.n.  Patient Instructions: 1)  We sent in your RX to [facility] 90 day supply 2)  The medication list was reviewed and reconciled.  All changed / newly prescribed medications were explained.  A complete medication list was provided to the patient / caregiver. Prescriptions: BENTYL 10 MG CAPS (DICYCLOMINE HCL) take one tablet q.i.d. p.r.n.  #360 x 4   Entered by:   Merri Ray CMA (AAMA)   Authorized by:   Louis Meckel MD   Signed by:   Merri Ray CMA (AAMA) on 02/22/2010   Method used:   Electronically to        [facility]* (mail-order)             ,          Ph: [phone]       Fax: [phone]   RxID:   [id number] BENTYL 10 MG CAPS (DICYCLOMINE HCL) take one tablet q.i.d. p.r.n.  #25 x 2   Entered and Authorized by:   Louis Meckel MD   Signed by:   Louis Meckel MD on 02/22/2010  Method used:   Electronically to        [id number] (retail)       [address] [place], [place]  [address]       Ph: [phone]       Fax: [phone]   RxID:   [id number]

## 2011-01-28 NOTE — Progress Notes (Signed)
Summary: elevated BS  Phone Note Call from Patient Call back at Home Phone [phone]   Caller: Patient Summary of Call: Pt states that BS are elevated at 398 ,301 after meal. Pt had a cortisone shot last week for her knee. Pt has been using sliding scale that was provided to her about 9 month ago by dr Laury Axon. Per pt she is to take 6 unit of insulin(humalog 100) for reading of 301-350. Pt c/o nausea, nervousness, and dizziness...Marland KitchenMarland KitchenFelecia Deloach CMA  October 24, 2010 3:52 PM   Follow-up for Phone Call        are her sxs of nervousness, nausea and dizziness before or after taking the insulin?  if before- needs to take insulin.  if after, needs to recheck CBG.  if sugars remain elevated or symptoms continue will need to go to ER or UC for labs. Follow-up by: Neena Rhymes MD,  October 24, 2010 4:00 PM  Additional Follow-up for Phone Call Additional follow up Details #1::        Discuss with patient will monitor BS and if still elevated witl go to ED/UC ............Marland KitchenFelecia Deloach CMA  October 24, 2010 4:19 PM

## 2011-01-28 NOTE — Assessment & Plan Note (Signed)
Summary: FOR A SORE THROAT AND LABS RESULT//PH   Vital Signs:  Patient profile:   70 year old female Weight:      199 pounds Temp:     98.3 degrees F oral Pulse rate:   86 / minute Pulse rhythm:   regular BP sitting:   136 / 76  (left arm) Cuff size:   large  Vitals Entered By: Army Fossa CMA (February 12, 2010 1:54 PM) CC: Pt c/o sore throat, and headache worse in the am, discuss Vitamin d, discuss rash(not any better)    History of Present Illness: Pt here c/o snoring very loudly--her daughter complained.that she could hear her snoring from the 3rd floor.  Pt also frequently wakes up with sore throat and headache.   Pt states she would like to know how her vita D level is. Also---the rash is no better.  Pt has been to derm here x2 and at [facility]  No one has been able to tell her what is wrong but a nurse told her to see a different derm at [facility].  She could not remember the name of the DR but she has it at home and she is going to make appointment.    No other complaints.   Current Medications (verified): 1)  Hydrocodone-Acetaminophen 7.5-750 Mg Tabs (Hydrocodone-Acetaminophen) .Marland Kitchen.. 1 By Mouth Every 6 Hours As Needed 2)  Actos 45 Mg Tabs (Pioglitazone Hcl) .... Take One Tablet Daily 3)  Lyrica 75 Mg  Caps (Pregabalin) .... Take 1 Capsule By Mouth Twice Daily 4)  Quinapril Hcl 40 Mg  Tabs (Quinapril Hcl) .... Take One Tablet Daily 5)  Omeprazole 20 Mg  Tbec (Omeprazole) .... Take One Tablet Daily 6)  Singulair 10 Mg  Tabs (Montelukast Sodium) .... Take One Tablet Daily 7)  Estradiol 1 Mg  Tabs (Estradiol) .... Take One Tablet Daily 8)  Torsemide 20 Mg  Tabs (Torsemide) .... Take One Tablet Daily 9)  Zyrtec Allergy 10 Mg Tabs (Cetirizine Hcl) .... Take 1 Tablet By Mouth Once A Day 10)  Qvar 80 Mcg/act  Aers (Beclomethasone Dipropionate) .... Use Once Daily As Directed 11)  Maxair Autohaler 200 Mcg/inh  Aerb (Pirbuterol Acetate) .... One Puff As Needed 12)  Patanol 0.1 %   Soln (Olopatadine Hcl) .... Use Once Daily 13)  Caltrate 600 1500 Mg  Tabs (Calcium Carbonate) .... One Tablet Daily 14)  Aspirin 81 Mg  Tbec (Aspirin) .... One Tablet Daily 15)  Amlodipine Besylate 5 Mg  Tabs (Amlodipine Besylate) .... Take One Tablet Daily 16)  Vitamin D 57846 Unit  Caps (Ergocalciferol) .Marland Kitchen.. 1 Cap 2 Times A Week 17)  Nasacort Aq 55 Mcg/act  Aers (Triamcinolone Acetonide(Nasal)) .... 2 Sprays Each Nostril Once Daily 18)  Stool Softener 100 Mg Caps (Docusate Sodium) .... Three Capsules By Mouth Daily 19)  Fiberchoice 2 Gm Chew (Inulin) .... Two Tablets By Mouth Daily 20)  Omega-3 350 Mg Caps (Omega-3 Fatty Acids) .... One Tablet By Mouth Once Daily 21)  Vitamin E 200 Unit Caps (Vitamin E) .... Take 1 Capsule By Mouth Once A Day 22)  Co Q-10 30 Mg Caps (Coenzyme Q10) .... Take 1 Capsule By Mouth Once A Day 23)  Vit B .... Take 1 Tablet By Mouth Once A Day 24)  Mucinex Dm Maximum Strength 60-1200 Mg  Xr12h-Tab (Dextromethorphan-Guaifenesin) .... As Needed. 25)  Nasonex 50 Mcg/act Susp (Mometasone Furoate) .... One Spray in Each Nostril Once Daily As Needed. 26)  Voltaren 1 % Gel (Diclofenac  Sodium) .... Apply 4 G Qid As Needed 27)  Multigen Plus 50-101-1 Mg Tabs (Feasp-Fefum -Suc-C-Thre-B12-Fa) .Marland Kitchen.. 1 By Mouth Once Daily 28)  Lovaza 1 Gm Caps (Omega-3-Acid Ethyl Esters) .... 2 By Mouth Two Times A Day 29)  Silvadene 1 % Crea (Silver Sulfadiazine) .... As Directed 30)  Hydroxyzine Hcl 25 Mg Tabs (Hydroxyzine Hcl) .Marland Kitchen.. 1 By Mouth Every 4 Hrs As Needed 31)  Metrogel-Vaginal 0.75 % Gel (Metronidazole) 32)  Bactrim Ds 800-160 Mg Tabs (Sulfamethoxazole-Trimethoprim) .Marland Kitchen.. 1 By Mouth Two Times A Day  Allergies: 1)  ! Ampicillin (Ampicillin) 2)  ! Synvisc (Hylan)  Past History:  Past medical, surgical, family and social histories (including risk factors) reviewed for relevance to current acute and chronic problems.  Past Medical History: Reviewed history from 04/24/2008 and no  changes required. Allergic rhinitis Diabetes mellitus, type II Hyperlipidemia Hypertension Osteopenia Peripheral neuropathy  Past Surgical History: Reviewed history from 01/05/2009 and no changes required. STRESS TEST 09/2002, 07/09/2004 No Orthopedic surgery Hysterectomy and L ovary  Family History: Reviewed history from 12/04/2008 and no changes required. Lung cancer-father brother-bone cancer multiple myeloma  Family History of Colon Cancer:Uncle and 2 first cousins  Social History: Reviewed history from 01/28/2010 and no changes required. Patient has never smoked.  Alcohol Use - no Daily Caffeine Use Occupation: Retired  Review of Systems      See HPI  Physical Exam  General:  Well-developed,well-nourished,in no acute distress; alert,appropriate and cooperative throughout examination Mouth:  Oral mucosa and oropharynx without lesions or exudates.  Teeth in good repair. Neck:  No deformities, masses, or tenderness noted. Lungs:  Normal respiratory effort, chest expands symmetrically. Lungs are clear to auscultation, no crackles or wheezes. Heart:  normal rate and no murmur.   Extremities:  No clubbing, cyanosis, edema, or deformity noted with normal full range of motion of all joints.   Skin:  + papules b/l legs---improved from previously Cervical Nodes:  No lymphadenopathy noted Psych:  Cognition and judgment appear intact. Alert and cooperative with normal attention span and concentration. No apparent delusions, illusions, hallucinations   Impression & Recommendations:  Problem # 1:  SNORING, HX OF (ICD-V15.89)  Orders: Sleep Disorder Referral (Sleep Disorder)  Problem # 2:  SKIN RASH (ICD-782.1)  Problem # 3:  VITAMIN D DEFICIENCY (ICD-268.9) labs reviewed  Complete Medication List: 1)  Hydrocodone-acetaminophen 7.5-750 Mg Tabs (Hydrocodone-acetaminophen) .Marland Kitchen.. 1 by mouth every 6 hours as needed 2)  Actos 45 Mg Tabs (Pioglitazone hcl) .... Take one tablet  daily 3)  Lyrica 75 Mg Caps (Pregabalin) .... Take 1 capsule by mouth twice daily 4)  Quinapril Hcl 40 Mg Tabs (Quinapril hcl) .... Take one tablet daily 5)  Omeprazole 20 Mg Tbec (Omeprazole) .... Take one tablet daily 6)  Singulair 10 Mg Tabs (Montelukast sodium) .... Take one tablet daily 7)  Estradiol 1 Mg Tabs (Estradiol) .... Take one tablet daily 8)  Torsemide 20 Mg Tabs (Torsemide) .... Take one tablet daily 9)  Zyrtec Allergy 10 Mg Tabs (Cetirizine hcl) .... Take 1 tablet by mouth once a day 10)  Qvar 80 Mcg/act Aers (Beclomethasone dipropionate) .... Use once daily as directed 11)  Maxair Autohaler 200 Mcg/inh Aerb (Pirbuterol acetate) .... One puff as needed 12)  Patanol 0.1 % Soln (Olopatadine hcl) .... Use once daily 13)  Caltrate 600 1500 Mg Tabs (Calcium carbonate) .... One tablet daily 14)  Aspirin 81 Mg Tbec (Aspirin) .... One tablet daily 15)  Amlodipine Besylate 5 Mg Tabs (Amlodipine besylate) .Marland KitchenMarland KitchenMarland Kitchen  Take one tablet daily 16)  Vitamin D 50000 Unit Caps (Ergocalciferol) .Marland Kitchen.. 1 cap 2 times a week 17)  Nasacort Aq 55 Mcg/act Aers (Triamcinolone acetonide(nasal)) .... 2 sprays each nostril once daily 18)  Stool Softener 100 Mg Caps (Docusate sodium) .... Three capsules by mouth daily 19)  Fiberchoice 2 Gm Chew (Inulin) .... Two tablets by mouth daily 20)  Omega-3 350 Mg Caps (Omega-3 fatty acids) .... One tablet by mouth once daily 21)  Vitamin E 200 Unit Caps (Vitamin e) .... Take 1 capsule by mouth once a day 22)  Co Q-10 30 Mg Caps (Coenzyme q10) .... Take 1 capsule by mouth once a day 23)  Vit B  .... Take 1 tablet by mouth once a day 24)  Mucinex Dm Maximum Strength 60-1200 Mg Xr12h-tab (Dextromethorphan-guaifenesin) .... As needed. 25)  Nasonex 50 Mcg/act Susp (Mometasone furoate) .... One spray in each nostril once daily as needed. 26)  Voltaren 1 % Gel (Diclofenac sodium) .... Apply 4 g qid as needed 27)  Multigen Plus 50-101-1 Mg Tabs (Feasp-fefum -suc-c-thre-b12-fa)  .Marland Kitchen.. 1 by mouth once daily 28)  Lovaza 1 Gm Caps (Omega-3-acid ethyl esters) .... 2 by mouth two times a day 29)  Silvadene 1 % Crea (Silver sulfadiazine) .... As directed 30)  Hydroxyzine Hcl 25 Mg Tabs (Hydroxyzine hcl) .Marland Kitchen.. 1 by mouth every 4 hrs as needed 31)  Metrogel-vaginal 0.75 % Gel (Metronidazole) 32)  Bactrim Ds 800-160 Mg Tabs (Sulfamethoxazole-trimethoprim) .Marland Kitchen.. 1 by mouth two times a day Prescriptions: MULTIGEN PLUS 50-101-1 MG TABS (FEASP-FEFUM -SUC-C-THRE-B12-FA) 1 by mouth once daily  #30 x 11   Entered and Authorized by:   Loreen Freud DO   Signed by:   Loreen Freud DO on 02/12/2010   Method used:   Electronically to        [id number]* (retail)       [address] [place], [place]  [address]       Ph: [phone]       Fax: [phone]   RxID:   [id number] VITAMIN D 50000 UNIT  CAPS (ERGOCALCIFEROL) 1 cap 2 times a week  #12 x 3   Entered and Authorized by:   Loreen Freud DO   Signed by:   Loreen Freud DO on 02/12/2010   Method used:   Electronically to        [facility] #[id number]* (retail)       [address] [place], [place]  [address]       Ph: [phone]       Fax: [phone]   RxID:   [id number]

## 2011-01-28 NOTE — Progress Notes (Signed)
Summary: refill  Phone Note Refill Request Message from:  Fax from Pharmacy on October 22, 2010 10:22 AM  Refills Requested: Medication #1:  HYDROCODONE-ACETAMINOPHEN 7.5-750 MG TABS 1 by mouth every 6 hours as needed [facility] - fax [phone]  Initial call taken by: Okey Regal Spring,  October 22, 2010 10:23 AM  Follow-up for Phone Call        Please advise. Lucious Groves CMA  October 22, 2010 10:42 AM   Additional Follow-up for Phone Call Additional follow up Details #1::        refill x1 Additional Follow-up by: Loreen Freud DO,  October 22, 2010 11:52 AM    Prescriptions: HYDROCODONE-ACETAMINOPHEN 7.5-750 MG TABS (HYDROCODONE-ACETAMINOPHEN) 1 by mouth every 6 hours as needed  #120 x 0   Entered by:   Almeta Monas CMA (AAMA)   Authorized by:   Loreen Freud DO   Signed by:   Almeta Monas CMA (AAMA) on 10/22/2010   Method used:   Printed then faxed to ...       [facility] [address] (retail)       [address] [place], [place]  [address]       Ph: [phone]       Fax: [phone]   RxID:   [id number]

## 2011-01-28 NOTE — Progress Notes (Signed)
Summary: [facility]  Phone Note Outgoing Call   Call placed by: Jeremy Johann CMA,  November 25, 2010 9:37 AM Details for Reason: [facility] Triage Call Report Triage Record Num: [id number] Operator: Jeraldine Loots Patient Name: Stacey Leon Call Date & Time: 11/22/2010 6:01:28PM Patient Phone: [phone] PCP: Patient Gender: Female PCP Fax : Patient DOB: Dec 14, 1941 Practice Name: [facility] Reason for Call: Diabetic pt out of town, completely out of med. Actos 45 1 po daily, she will be OOT x 5 days. Needs emergency refill. [facility] [phone] fax: [phone] Protocol(s) Used: Office Note Recommended Outcome per Protocol: Information Noted and Sent to Office Reason for Outcome: Caller information to office Care Advice:  ~ 11/ Summary of Call: Rx sent to mail order on 11-19-10..................Marland KitchenFelecia Deloach CMA  November 25, 2010 9:37 AM

## 2011-01-28 NOTE — Letter (Signed)
Summary: CMN for Diabetes Supplies/[facility]  CMN for Diabetes Supplies/[facility]   Imported By: Lanelle Bal 01/28/2010 14:14:09  _____________________________________________________________________  External Attachment:    Type:   Image     Comment:   External Document

## 2011-01-28 NOTE — Assessment & Plan Note (Signed)
Summary: PAIN IN KNEES AND ARM---WEEKS AGO///SPH   Vital Signs:  Patient profile:   70 year old female Height:      64 inches Weight:      208.0 pounds BMI:     35.83 Temp:     98.5 degrees F oral Pulse rate:   76 / minute Pulse rhythm:   regular BP sitting:   130 / 70  (right arm) Cuff size:   large  Vitals Entered By: Almeta Monas CMA Duncan Dull) (October 15, 2010 2:05 PM) CC: x3 weeks c/o left arm knot  and pain, thinks bursitis is coming back. request a sample of insulin   History of Present Illness: Pt here c/o pain in L shoulder and arm x 1 month---it started when she went on vacation.  No cp.  No known injury but it feels like her bursitis.  Current Medications (verified): 1)  Hydrocodone-Acetaminophen 7.5-750 Mg Tabs (Hydrocodone-Acetaminophen) .Marland Kitchen.. 1 By Mouth Every 6 Hours As Needed 2)  Actos 45 Mg Tabs (Pioglitazone Hcl) .... Take One Tablet Daily 3)  Lyrica 75 Mg  Caps (Pregabalin) .... Take 1 Capsule By Mouth Twice Daily 4)  Quinapril Hcl 40 Mg  Tabs (Quinapril Hcl) .... Take One Tablet Daily 5)  Omeprazole 20 Mg  Tbec (Omeprazole) .... Take One Tablet Daily 6)  Singulair 10 Mg  Tabs (Montelukast Sodium) .... Take One Tablet Daily 7)  Estradiol 1 Mg  Tabs (Estradiol) .... Take One Tablet Daily 8)  Torsemide 20 Mg  Tabs (Torsemide) .... Take One Tablet Daily 9)  Zyrtec Allergy 10 Mg Tabs (Cetirizine Hcl) .... Take 1 Tablet By Mouth Once A Day 10)  Qvar 80 Mcg/act  Aers (Beclomethasone Dipropionate) .... Use Once Daily As Directed 11)  Maxair Autohaler 200 Mcg/inh  Aerb (Pirbuterol Acetate) .... One Puff As Needed 12)  Patanol 0.1 %  Soln (Olopatadine Hcl) .... Use Once Daily 13)  Caltrate 600 1500 Mg  Tabs (Calcium Carbonate) .... One Tablet Daily 14)  Aspirin 81 Mg  Tbec (Aspirin) .... One Tablet Daily 15)  Amlodipine Besylate 5 Mg  Tabs (Amlodipine Besylate) .... Take One Tablet Daily 16)  Vitamin D 52841 Unit  Caps (Ergocalciferol) .Marland Kitchen.. 1 Cap 2 Times A Week 17)   Nasacort Aq 55 Mcg/act  Aers (Triamcinolone Acetonide(Nasal)) .... 2 Sprays Each Nostril Once Daily 18)  Stool Softener 100 Mg Caps (Docusate Sodium) .... Three Capsules By Mouth Daily 19)  Fiberchoice 2 Gm Chew (Inulin) .... Two Tablets By Mouth Daily 20)  Omega-3 350 Mg Caps (Omega-3 Fatty Acids) .... One Tablet By Mouth Once Daily 21)  Vitamin E 200 Unit Caps (Vitamin E) .... Take 1 Capsule By Mouth Once A Day 22)  Co Q-10 30 Mg Caps (Coenzyme Q10) .... Take 1 Capsule By Mouth Once A Day 23)  Vit B .... Take 1 Tablet By Mouth Once A Day 24)  Mucinex Dm Maximum Strength 60-1200 Mg  Xr12h-Tab (Dextromethorphan-Guaifenesin) .... As Needed. 25)  Nasonex 50 Mcg/act Susp (Mometasone Furoate) .... One Spray in Each Nostril Once Daily As Needed. 26)  Voltaren 1 % Gel (Diclofenac Sodium) .... Apply 4 G Qid As Needed 27)  Multigen Plus 50-101-1 Mg Tabs (Feasp-Fefum -Suc-C-Thre-B12-Fa) .Marland Kitchen.. 1 By Mouth Once Daily 28)  Lovaza 1 Gm Caps (Omega-3-Acid Ethyl Esters) .... 2 By Mouth Two Times A Day 29)  Silvadene 1 % Crea (Silver Sulfadiazine) .... As Directed 30)  Hydroxyzine Hcl 25 Mg Tabs (Hydroxyzine Hcl) .Marland Kitchen.. 1 By Mouth Every 4 Hrs  As Needed 31)  Amoxicillin 875 Mg Tabs (Amoxicillin) .Marland Kitchen.. 1 By Mouth Once Daily X10days  Allergies (verified): 1)  ! Ampicillin (Ampicillin) 2)  ! Synvisc (Hylan)  Past History:  Past medical, surgical, family and social histories (including risk factors) reviewed for relevance to current acute and chronic problems.  Past Medical History: Reviewed history from 05/07/2010 and no changes required. Allergic rhinitis Diabetes mellitus, type II Hyperlipidemia Hypertension Osteopenia Peripheral neuropathy OSA      - PSG 03/05/10 RDI 9, REM predominant      - CPAP 10 cm H2O  Past Surgical History: Reviewed history from 01/05/2009 and no changes required. STRESS TEST 09/2002, 07/09/2004 No Orthopedic surgery Hysterectomy and L ovary  Family History: Reviewed  history from 12/04/2008 and no changes required. Lung cancer-father brother-bone cancer multiple myeloma  Family History of Colon Cancer:Uncle and 2 first cousins  Social History: Reviewed history from 01/28/2010 and no changes required. Patient has never smoked.  Alcohol Use - no Daily Caffeine Use Occupation: Retired  Review of Systems      See HPI  Physical Exam  General:  Well-developed,well-nourished,in no acute distress; alert,appropriate and cooperative throughout examination Msk:  +pain with palpation ant shoulder and lat elbow on Left no swelling no errythema no joint warmth.   Extremities:  No clubbing, cyanosis, edema, or deformity noted with normal full range of motion of all joints.   Neurologic:  No cranial nerve deficits noted. Station and gait are normal. Plantar reflexes are down-going bilaterally. DTRs are symmetrical throughout. Sensory, motor and coordinative functions appear intact. Skin:  Intact without suspicious lesions or rashes Psych:  Cognition and judgment appear intact. Alert and cooperative with normal attention span and concentration. No apparent delusions, illusions, hallucinations   Impression & Recommendations:  Problem # 1:  SHOULDER PAIN, LEFT (ICD-719.41)  Her updated medication list for this problem includes:    Hydrocodone-acetaminophen 7.5-750 Mg Tabs (Hydrocodone-acetaminophen) .Marland Kitchen... 1 by mouth every 6 hours as needed    Aspirin 81 Mg Tbec (Aspirin) ..... One tablet daily  Orders: Orthopedic Surgeon Referral (Ortho Surgeon)  Discussed shoulder exercises, use of moist heat or ice, and medication.   Problem # 2:  ARM PAIN, LEFT (ICD-729.5)  Orders: Orthopedic Surgeon Referral (Ortho Surgeon)  Complete Medication List: 1)  Hydrocodone-acetaminophen 7.5-750 Mg Tabs (Hydrocodone-acetaminophen) .Marland Kitchen.. 1 by mouth every 6 hours as needed 2)  Actos 45 Mg Tabs (Pioglitazone hcl) .... Take one tablet daily 3)  Lyrica 75 Mg Caps  (Pregabalin) .... Take 1 capsule by mouth twice daily 4)  Quinapril Hcl 40 Mg Tabs (Quinapril hcl) .... Take one tablet daily 5)  Omeprazole 20 Mg Tbec (Omeprazole) .... Take one tablet daily 6)  Singulair 10 Mg Tabs (Montelukast sodium) .... Take one tablet daily 7)  Estradiol 1 Mg Tabs (Estradiol) .... Take one tablet daily 8)  Torsemide 20 Mg Tabs (Torsemide) .... Take one tablet daily 9)  Zyrtec Allergy 10 Mg Tabs (Cetirizine hcl) .... Take 1 tablet by mouth once a day 10)  Qvar 80 Mcg/act Aers (Beclomethasone dipropionate) .... Use once daily as directed 11)  Maxair Autohaler 200 Mcg/inh Aerb (Pirbuterol acetate) .... One puff as needed 12)  Patanol 0.1 % Soln (Olopatadine hcl) .... Use once daily 13)  Caltrate 600 1500 Mg Tabs (Calcium carbonate) .... One tablet daily 14)  Aspirin 81 Mg Tbec (Aspirin) .... One tablet daily 15)  Amlodipine Besylate 5 Mg Tabs (Amlodipine besylate) .... Take one tablet daily 16)  Vitamin D 16109 Unit  Caps (Ergocalciferol) .Marland Kitchen.. 1 cap 2 times a week 17)  Nasacort Aq 55 Mcg/act Aers (Triamcinolone acetonide(nasal)) .... 2 sprays each nostril once daily 18)  Stool Softener 100 Mg Caps (Docusate sodium) .... Three capsules by mouth daily 19)  Fiberchoice 2 Gm Chew (Inulin) .... Two tablets by mouth daily 20)  Omega-3 350 Mg Caps (Omega-3 fatty acids) .... One tablet by mouth once daily 21)  Vitamin E 200 Unit Caps (Vitamin e) .... Take 1 capsule by mouth once a day 22)  Co Q-10 30 Mg Caps (Coenzyme q10) .... Take 1 capsule by mouth once a day 23)  Vit B  .... Take 1 tablet by mouth once a day 24)  Mucinex Dm Maximum Strength 60-1200 Mg Xr12h-tab (Dextromethorphan-guaifenesin) .... As needed. 25)  Nasonex 50 Mcg/act Susp (Mometasone furoate) .... One spray in each nostril once daily as needed. 26)  Voltaren 1 % Gel (Diclofenac sodium) .... Apply 4 g qid as needed 27)  Multigen Plus 50-101-1 Mg Tabs (Feasp-fefum -suc-c-thre-b12-fa) .Marland Kitchen.. 1 by mouth once daily 28)   Lovaza 1 Gm Caps (Omega-3-acid ethyl esters) .... 2 by mouth two times a day 29)  Silvadene 1 % Crea (Silver sulfadiazine) .... As directed 30)  Hydroxyzine Hcl 25 Mg Tabs (Hydroxyzine hcl) .Marland Kitchen.. 1 by mouth every 4 hrs as needed 31)  Amoxicillin 875 Mg Tabs (Amoxicillin) .Marland Kitchen.. 1 by mouth once daily x10days   Orders Added: 1)  Orthopedic Surgeon Referral [Ortho Surgeon] 2)  Est. Patient Level III [[address]]

## 2011-01-28 NOTE — Progress Notes (Signed)
Summary: refill  Phone Note Refill Request Message from:  Fax from Pharmacy on November 26, 2010 9:03 AM  Refills Requested: Medication #1:  HYDROCODONE-ACETAMINOPHEN 7.5-750 MG TABS 1 by mouth every 6 hours as needed [facility] fax [phone]   Initial call taken by: Okey Regal [place],  November 26, 2010 9:03 AM  Follow-up for Phone Call        last seen 10/15/10 and filled 10/22/10-- please advise Follow-up by: Almeta Monas CMA Duncan Dull),  November 26, 2010 11:42 AM  Additional Follow-up for Phone Call Additional follow up Details #1::        ok to refill Additional Follow-up by: Loreen Freud DO,  November 26, 2010 11:57 AM    Additional Follow-up for Phone Call Additional follow up Details #2::    [facility] Follow-up by: Almeta Monas CMA Duncan Dull),  November 26, 2010 2:43 PM  Prescriptions: HYDROCODONE-ACETAMINOPHEN 7.5-750 MG TABS (HYDROCODONE-ACETAMINOPHEN) 1 by mouth every 6 hours as needed  #120 x 0   Entered by:   Almeta Monas CMA (AAMA)   Authorized by:   Loreen Freud DO   Signed by:   Almeta Monas CMA (AAMA) on 11/26/2010   Method used:   Printed then faxed to ...       [id number]* (retail)       [address] [place], [place]  [address]       Ph: [phone]       Fax: [phone]   RxID:   [id number]

## 2011-01-28 NOTE — Progress Notes (Signed)
Summary:  elevated BS  Phone Note Call from Patient Call back at [phone]   Caller: Patient Summary of Call: pt states that she was given steroid injection for back pain and since than BS have been rising. pt states that BS have been running around 110 to 250. pls advise on BS elevation..............................Marland KitchenFelecia Deloach CMA  January 02, 2010 12:29 PM   Follow-up for Phone Call        she needs regular insulin pen---we can give her one if we have it---sliding scale insulin  200-250  2 u, 251-300 4 u,  301- 350 6 u,  351-400 8 u ,   > 400 10 units and call dr Follow-up by: Loreen Freud DO,  January 02, 2010 12:33 PM  Additional Follow-up for Phone Call Additional follow up Details #1::        left message to call  office..............Marland KitchenFelecia Deloach CMA  January 02, 2010 3:08 PM  pt aware of sliding scale. sample placed up front ............Marland KitchenFelecia Deloach CMA  January 02, 2010 3:54 PM

## 2011-01-28 NOTE — Letter (Signed)
Summary: EGD Instructions  [facility]  [address] [place], [place] [address]   Phone: [phone]  Fax: [phone]       Stacey Leon    [birth year]    MRN: [id number]       Procedure Day /Date:TUESDAY 01/29/2010     Arrival Time: 3:00PM     Procedure Time:4:00PM     Location of Procedure:                    X  [facility] (4th Floor)    PREPARATION FOR ENDOSCOPY   On 01/29/2010  THE DAY OF THE PROCEDURE:  1.   No solid foods, milk or milk products are allowed after midnight the night before your procedure.  2.   Do not drink anything colored red or purple.  Avoid juices with pulp.  No orange juice.  3.  You may drink clear liquids until 2:00PM , which is 2 hours before your procedure.                                                                                                CLEAR LIQUIDS INCLUDE: Water Jello Ice Popsicles Tea (sugar ok, no milk/cream) Powdered fruit flavored drinks Coffee (sugar ok, no milk/cream) Gatorade Juice: apple, white grape, white cranberry  Lemonade Clear bullion, consomm, broth Carbonated beverages (any kind) Strained chicken noodle soup Hard Candy   MEDICATION INSTRUCTIONS  Unless otherwise instructed, you should take regular prescription medications with a small sip of water as early as possible the morning of your procedure.  Diabetic patients - see separate instructions. ACTOS         OTHER INSTRUCTIONS  You will need a responsible adult at least 68 years of age to accompany you and drive you home.   This person must remain in the waiting room during your procedure.  Wear loose fitting clothing that is easily removed.  Leave jewelry and other valuables at home.  However, you may wish to bring a book to read or an iPod/MP3 player to listen to music as you wait for your procedure to start.  Remove all body piercing jewelry and leave at home.  Total time from sign-in until discharge is  approximately 2-3 hours.  You should go home directly after your procedure and rest.  You can resume normal activities the day after your procedure.  The day of your procedure you should not:   Drive   Make legal decisions   Operate machinery   Drink alcohol   Return to work  You will receive specific instructions about eating, activities and medications before you leave.    The above instructions have been reviewed and explained to me by   _______________________    I fully understand and can verbalize these instructions _____________________________ Date _________

## 2011-01-28 NOTE — Progress Notes (Signed)
Summary: refill  Phone Note Refill Request Message from:  Fax from [facility] [address] fax [phone]  Refills Requested: Medication #1:  HYDROCODONE-ACETAMINOPHEN 7.5-750 MG TABS 1 by mouth every 6 hours as needed   Last Refilled: 12/17/2009   Notes: #120 last ov- 12/11/09 Army Fossa CMA  January 21, 2010 3:07 PM   Initial call taken by: Barb Merino,  January 21, 2010 2:17 PM  Follow-up for Phone Call        ok to refill x1 Follow-up by: Loreen Freud DO,  January 21, 2010 3:55 PM    Prescriptions: HYDROCODONE-ACETAMINOPHEN 7.5-750 MG TABS (HYDROCODONE-ACETAMINOPHEN) 1 by mouth every 6 hours as needed  #120 x 0   Entered by:   Army Fossa CMA   Authorized by:   Loreen Freud DO   Signed by:   Army Fossa CMA on 01/21/2010   Method used:   Printed then faxed to ...       [facility] [address] [facility] [address] ([facility]ail)       [address] [place], [place]  [address]       Ph: [phone]       Fax: [phone]   RxID:   [id number]

## 2011-01-28 NOTE — Letter (Signed)
Summary: [facility]  [facility]   Imported By: Lanelle Bal 03/12/2010 13:52:46  _____________________________________________________________________  External Attachment:    Type:   Image     Comment:   External Document

## 2011-01-28 NOTE — Miscellaneous (Signed)
Summary: Orders Update Scheduled GES  Clinical Lists Changes  Orders: Added new Test order of Gastric Emptying Scan (GES) - Signed
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
## 2011-01-28 NOTE — Progress Notes (Signed)
Summary: actos refill   Phone Note Refill Request Call back at Home Phone [phone] Message from:  Patient on November 19, 2010 2:58 PM  Refills Requested: Medication #1:  ACTOS 45 MG TABS take one tablet daily   Dosage confirmed as above?Dosage Confirmed   Supply Requested: 3 months [facility].  Initial call taken by: Freddy Jaksch,  November 19, 2010 2:58 PM    Prescriptions: ACTOS 45 MG TABS (PIOGLITAZONE HCL) take one tablet daily  #90 x 1   Entered by:   Doristine Devoid CMA   Authorized by:   Loreen Freud DO   Signed by:   Doristine Devoid CMA on 11/19/2010   Method used:   Faxed to ...       [facility] (mail-order)             , [place]         Ph: [phone]       Fax: [phone]   RxID:   [id number]

## 2011-01-28 NOTE — Miscellaneous (Signed)
Summary: CPAP mask fitting   Clinical Lists Changes Swift Fx medium pillows.  Will send order for this, and start on autoCPAP. Problems: Added new problem of OBSTRUCTIVE SLEEP APNEA (ICD-327.23) Orders: Added new Referral order of DME Referral (DME) - Signed

## 2011-01-28 NOTE — Medication Information (Signed)
Summary: Letter Regarding Actos & Fluid Retention/[facility]  Letter Regarding Actos & Fluid Retention/[facility]   Imported By: Lanelle Bal 05/07/2010 09:21:12  _____________________________________________________________________  External Attachment:    Type:   Image     Comment:   External Document

## 2011-01-28 NOTE — Assessment & Plan Note (Signed)
Summary: er follow-up//lch   Vital Signs:  Patient profile:   70 year old female Weight:      200.13 pounds Pulse rate:   92 / minute Pulse rhythm:   regular BP sitting:   126 / 82  (left arm) Cuff size:   regular  Vitals Entered By: Army Fossa CMA (March 07, 2010 2:25 PM) CC: Pt here for ER follow up, she thinks she may have a bladder infection, frequent urination.    History of Present Illness: Pt here to f/u from hospital ---chest pain.  No d/c summary in computer or e chart.   Pt was also treated for uti in hospital and she still has low abd pain and urinary frequency with nausea.  Pt was given cipro.    Allergies: 1)  ! Ampicillin (Ampicillin) 2)  ! Synvisc (Hylan)  Past History:  Past medical, surgical, family and social histories (including risk factors) reviewed for relevance to current acute and chronic problems.  Past Medical History: Reviewed history from 04/24/2008 and no changes required. Allergic rhinitis Diabetes mellitus, type II Hyperlipidemia Hypertension Osteopenia Peripheral neuropathy  Past Surgical History: Reviewed history from 01/05/2009 and no changes required. STRESS TEST 09/2002, 07/09/2004 No Orthopedic surgery Hysterectomy and L ovary  Family History: Reviewed history from 12/04/2008 and no changes required. Lung cancer-father brother-bone cancer multiple myeloma  Family History of Colon Cancer:Uncle and 2 first cousins  Social History: Reviewed history from 01/28/2010 and no changes required. Patient has never smoked.  Alcohol Use - no Daily Caffeine Use Occupation: Retired  Review of Systems      See HPI  Physical Exam  General:  Well-developed,well-nourished,in no acute distress; alert,appropriate and cooperative throughout examination Lungs:  Normal respiratory effort, chest expands symmetrically. Lungs are clear to auscultation, no crackles or wheezes. Heart:  Grade 2  /6 systolic ejection murmur.   Abdomen:  Bowel  sounds positive,abdomen soft and non-tender without masses, organomegaly or hernias noted. Skin:  Intact without suspicious lesions or rashes Psych:  Oriented X3 and normally interactive.     Impression & Recommendations:  Problem # 1:  CHEST PAIN UNSPECIFIED (ICD-786.50)  Orders: Cardiolite (Cardiolite)  Problem # 2:  GERD (ICD-530.81)  Her updated medication list for this problem includes:    Omeprazole 20 Mg Tbec (Omeprazole) .Marland Kitchen... Take one tablet daily    Bentyl 10 Mg Caps (Dicyclomine hcl) .Marland Kitchen... Take one tablet q.i.d. p.r.n.  Diagnostics Reviewed:  EGD: DONE (01/29/2010) Discussed lifestyle modifications, diet, antacids/medications, and preventive measures. Handout provided.   Problem # 3:  UTI (ICD-599.0)  Her updated medication list for this problem includes:    Vesicare 5 Mg Tabs (Solifenacin succinate) .Marland Kitchen... 1 by mouth once daily  Orders: T-Culture, Urine ([id number])  Problem # 4:  FREQUENCY, URINARY (ICD-788.41)  Her updated medication list for this problem includes:    Vesicare 5 Mg Tabs (Solifenacin succinate) .Marland Kitchen... 1 by mouth once daily  Discussed use of medication. ----  to urology if no better  Complete Medication List: 1)  Hydrocodone-acetaminophen 7.5-750 Mg Tabs (Hydrocodone-acetaminophen) .Marland Kitchen.. 1 by mouth every 6 hours as needed 2)  Actos 45 Mg Tabs (Pioglitazone hcl) .... Take one tablet daily 3)  Lyrica 75 Mg Caps (Pregabalin) .... Take 1 capsule by mouth twice daily 4)  Quinapril Hcl 40 Mg Tabs (Quinapril hcl) .... Take one tablet daily 5)  Omeprazole 20 Mg Tbec (Omeprazole) .... Take one tablet daily 6)  Singulair 10 Mg Tabs (Montelukast sodium) .... Take one tablet daily 7)  Estradiol 1 Mg Tabs (Estradiol) .... Take one tablet daily 8)  Torsemide 20 Mg Tabs (Torsemide) .... Take one tablet daily 9)  Zyrtec Allergy 10 Mg Tabs (Cetirizine hcl) .... Take 1 tablet by mouth once a day 10)  Qvar 80 Mcg/act Aers (Beclomethasone dipropionate) .... Use  once daily as directed 11)  Maxair Autohaler 200 Mcg/inh Aerb (Pirbuterol acetate) .... One puff as needed 12)  Patanol 0.1 % Soln (Olopatadine hcl) .... Use once daily 13)  Caltrate 600 1500 Mg Tabs (Calcium carbonate) .... One tablet daily 14)  Aspirin 81 Mg Tbec (Aspirin) .... One tablet daily 15)  Amlodipine Besylate 5 Mg Tabs (Amlodipine besylate) .... Take one tablet daily 16)  Vitamin D 54098 Unit Caps (Ergocalciferol) .Marland Kitchen.. 1 cap 2 times a week 17)  Nasacort Aq 55 Mcg/act Aers (Triamcinolone acetonide(nasal)) .... 2 sprays each nostril once daily 18)  Stool Softener 100 Mg Caps (Docusate sodium) .... Three capsules by mouth daily 19)  Fiberchoice 2 Gm Chew (Inulin) .... Two tablets by mouth daily 20)  Omega-3 350 Mg Caps (Omega-3 fatty acids) .... One tablet by mouth once daily 21)  Vitamin E 200 Unit Caps (Vitamin e) .... Take 1 capsule by mouth once a day 22)  Co Q-10 30 Mg Caps (Coenzyme q10) .... Take 1 capsule by mouth once a day 23)  Vit B  .... Take 1 tablet by mouth once a day 24)  Mucinex Dm Maximum Strength 60-1200 Mg Xr12h-tab (Dextromethorphan-guaifenesin) .... As needed. 25)  Nasonex 50 Mcg/act Susp (Mometasone furoate) .... One spray in each nostril once daily as needed. 26)  Voltaren 1 % Gel (Diclofenac sodium) .... Apply 4 g qid as needed 27)  Multigen Plus 50-101-1 Mg Tabs (Feasp-fefum -suc-c-thre-b12-fa) .Marland Kitchen.. 1 by mouth once daily 28)  Lovaza 1 Gm Caps (Omega-3-acid ethyl esters) .... 2 by mouth two times a day 29)  Silvadene 1 % Crea (Silver sulfadiazine) .... As directed 30)  Hydroxyzine Hcl 25 Mg Tabs (Hydroxyzine hcl) .Marland Kitchen.. 1 by mouth every 4 hrs as needed 31)  Metrogel-vaginal 0.75 % Gel (Metronidazole) 32)  Bentyl 10 Mg Caps (Dicyclomine hcl) .... Take one tablet q.i.d. p.r.n. 33)  Vesicare 5 Mg Tabs (Solifenacin succinate) .Marland Kitchen.. 1 by mouth once daily  Laboratory Results   Urine Tests    Routine Urinalysis   Color: yellow Appearance: Clear Glucose:  negative   (Normal Range: Negative) Bilirubin: negative   (Normal Range: Negative) Ketone: negative   (Normal Range: Negative) Spec. Gravity: 1.010   (Normal Range: 1.003-1.035) Blood: negative   (Normal Range: Negative) pH: 6.0   (Normal Range: 5.0-8.0) Protein: negative   (Normal Range: Negative) Urobilinogen: 0.2   (Normal Range: 0-1) Nitrite: negative   (Normal Range: Negative) Leukocyte Esterace: negative   (Normal Range: Negative)    Comments: Army Fossa CMA  March 07, 2010 3:10 PM

## 2011-01-28 NOTE — Progress Notes (Signed)
Summary: REFILL REQUEST  Phone Note Refill Request Call back at [phone] Message from:  Pharmacy on September 05, 2010 8:12 AM  Refills Requested: Medication #1:  HYDROCODONE-ACETAMINOPHEN 7.5-750 MG TABS 1 by mouth every 6 hours as needed   Dosage confirmed as above?Dosage Confirmed   Supply Requested: 1 month   Last Ref[facility]: [facility] [address]   Next Appointment Scheduled: 09/05/10 Initial call taken by: Lavell Islam,  September 05, 2010 8:14 AM  Follow-up for Phone Call        last filled 07/23/10 #120 and last seen 09/04/10. Please advise.... Almeta Monas CMA Duncan Dull)  September 05, 2010 8:43 AM   Additional Follow-up for Phone Call Additional follow up Details #1::        refill x1 Additional Follow-up by: Loreen Freud DO,  September 05, 2010 9:52 AM    Prescriptions: HYDROCODONE-ACETAMINOPHEN 7.5-750 MG TABS (HYDROCODONE-ACETAMINOPHEN) 1 by mouth every 6 hours as needed  #120 x 0   Entered by:   Shonna Chock CMA   Authorized by:   Loreen Freud DO   Signed by:   Shonna Chock CMA on 09/05/2010   Method used:   Printed then faxed to ...       [facility] [address] [facility] [address] ([facility]ail)       [address] [place], [place]  [address]       Ph: [phone]       Fax: [phone]   RxID:   [id number]

## 2011-01-28 NOTE — Letter (Signed)
Summary: Diabetic Instructions  [facility]  [address] [place], [place] [address]   Phone: [phone]  Fax: [phone]    VENITA SENG August 21, 1941 MRN: [id number]   X    ORAL DIABETIC MEDICATION INSTRUCTIONS / ACTOS  The day before your procedure:   Take your diabetic pill as you do normally  The day of your procedure:   Do not take your diabetic pill    We will check your blood sugar levels during the admission process and again in Recovery before discharging you home  ________________________________________________________________________  _  _   INSULIN (LONG ACTING) MEDICATION INSTRUCTIONS (Lantus, NPH, 70/30, Humulin, Novolin-N)   The day before your procedure:   Take  your regular evening dose    The day of your procedure:   Do not take your morning dose    _  _   INSULIN (SHORT ACTING) MEDICATION INSTRUCTIONS (Regular, Humulog, Novolog)   The day before your procedure:   Do not take your evening dose   The day of your procedure:   Do not take your morning dose   _  _   INSULIN PUMP MEDICATION INSTRUCTIONS  We will contact the physician managing your diabetic care for written dosage instructions for the day before your procedure and the day of your procedure.  Once we have received the instructions, we will contact you.

## 2011-01-28 NOTE — Progress Notes (Signed)
Summary: Refill Request  Phone Note Refill Request Call back at [phone] Message from:  Pharmacy on May 02, 2010 9:45 AM  Refills Requested: Medication #1:  HYDROCODONE-ACETAMINOPHEN 7.5-750 MG TABS 1 by mouth every 6 hours as needed   Dosage confirmed as above?Dosage Confirmed   Supply Requested: 1 month   [facility] [address]  Next Appointment Scheduled: none w/ Laury Axon Initial call taken by: Harold Barban,  May 02, 2010 9:45 AM  Follow-up for Phone Call        last ov- 03/07/10. Army Fossa CMA  May 02, 2010 9:50 AM   Additional Follow-up for Phone Call Additional follow up Details #1::        refill x1 Additional Follow-up by: Loreen Freud DO,  May 02, 2010 10:30 AM    Prescriptions: HYDROCODONE-ACETAMINOPHEN 7.5-750 MG TABS (HYDROCODONE-ACETAMINOPHEN) 1 by mouth every 6 hours as needed  #120 x 0   Entered by:   Army Fossa CMA   Authorized by:   Loreen Freud DO   Signed by:   Army Fossa CMA on 05/02/2010   Method used:   Printed then faxed to ...       [facility] [address] [facility] [address] ([facility]ail)       [address] [place], [place]  [address]       Ph: [phone]       Fax: [phone]   RxID:   [id number]

## 2011-01-28 NOTE — Miscellaneous (Signed)
Summary: CPAP download.   Clinical Lists Changes Used on 30 of 31 nights with average 5hrs 16 min.  Average AHI 3.3 with CPAP 10 cm H2O.  Will have my nurse call to inform patient that CPAP report looked good, and she is continue current set up.  Appended Document: CPAP download. LMOMTCB X1  Appended Document: CPAP download. The patient informed cpap report looked good and no change to current set up per Dr. Craige Cotta.

## 2011-01-28 NOTE — Procedures (Signed)
Summary: Upper Endoscopy  Patient: Stacey Leon Note: All result statuses are Final unless otherwise noted.  Tests: (1) Upper Endoscopy (EGD)   EGD Upper Endoscopy       DONE     [facility]     [address]     [place], [place]  [address]           ENDOSCOPY PROCEDURE REPORT           PATIENT:  Stacey Leon, Stacey Leon  MR#:  [id number]     BIRTHDATE:  1941-03-08, 68 yrs. old  GENDER:  female           ENDOSCOPIST:  Barbette Hair. Arlyce Dice, MD     Referred by:           PROCEDURE DATE:  01/29/2010     PROCEDURE:  EGD, diagnostic     ASA CLASS:  Class II     INDICATIONS:  nausea           MEDICATIONS:   Fentanyl 50 mcg IV, Versed 7 mg IV, glycopyrrolate     (Robinal) 0.2 mg IV, 0.6cc simethancone 0.6 cc PO     TOPICAL ANESTHETIC:  Exactacain Spray           DESCRIPTION OF PROCEDURE:   After the risks benefits and     alternatives of the procedure were thoroughly explained, informed     consent was obtained.  The LB [id number] endoscope was     introduced through the mouth and advanced to the third portion of     the duodenum, without limitations.  The instrument was slowly     withdrawn as the mucosa was fully examined.           The upper, middle, and distal third of the esophagus were     carefully inspected and no abnormalities were noted. The z-line     was well seen at the GEJ. The endoscope was pushed into the fundus     which was normal including a retroflexed view. The antrum,gastric     body, first and second part of the duodenum were unremarkable.     Retroflexed views revealed no abnormalities.    The scope was then     withdrawn from the patient and the procedure completed.           COMPLICATIONS:  None           ENDOSCOPIC IMPRESSION:     1) Normal EGD           Nausea could be related to polypharmacy, gastroparesis, or both           RECOMMENDATIONS:     1) My office will arrange for you to have a Gastric Emptying     Scan performed. This is a  radiology test that gives an idea of how     well your stomach functions.     2) Call office next 2-3 days to schedule an office appointment     for 3-4 weeks           REPEAT EXAM:  No           ______________________________     Barbette Hair. Arlyce Dice, MD           CC:  Lelon Perla, DO           n.     eSIGNED:   Barbette Hair. Shawnta Schlegel at 01/29/2010 04:05 PM  Kaytee, [place], [phone]  Note: An exclamation mark (!) indicates a result that was not dispersed into the flowsheet. Document Creation Date: 01/29/2010 4:05 PM _______________________________________________________________________  (1) Order result status: Final Collection or observation date-time: 01/29/2010 15:59 Requested date-time:  Receipt date-time:  Reported date-time:  Referring Physician:   Ordering Physician: Melvia Heaps [phone]) Specimen Source:  Source: Launa Grill Order Number: [id number] Lab site:

## 2011-01-28 NOTE — Medication Information (Signed)
Summary: Letter Regarding Actos & Edema/[facility]  Letter Regarding Actos & Edema/[facility]   Imported By: Lanelle Bal 09/03/2010 12:50:11  _____________________________________________________________________  External Attachment:    Type:   Image     Comment:   External Document

## 2011-01-28 NOTE — Letter (Signed)
[facility]: [facility]   Imported By: Lanelle Bal 04/02/2010 08:46:11  _____________________________________________________________________  External Attachment:    Type:   Image     Comment:   External Document

## 2011-01-29 ENCOUNTER — Telehealth: Payer: Self-pay | Admitting: Family Medicine

## 2011-01-30 NOTE — Assessment & Plan Note (Signed)
Summary: FOLLOWUP ON LABS/KN   Vital Signs:  Patient profile:   70 year old female Weight:      207.2 pounds Pulse rate:   76 / minute Pulse rhythm:   regular BP sitting:   140 / 70  (right arm) Cuff size:   large  Vitals Entered By: Almeta Monas CMA Duncan Dull) (December 19, 2010 11:22 AM) CC: 2 week f/u---needs Rx for diabetic shoes   History of Present Illness: Pt here to review labs and get meds refiled.    Current Medications (verified): 1)  Hydrocodone-Acetaminophen 7.5-750 Mg Tabs (Hydrocodone-Acetaminophen) .Marland Kitchen.. 1 By Mouth Every 6 Hours As Needed 2)  Januvia 100 Mg Tabs (Sitagliptin Phosphate) .Marland Kitchen.. 1 By Mouth Once Daily 3)  Lyrica 50 Mg Caps (Pregabalin) .Marland Kitchen.. 1po Three Times A Day 4)  Quinapril Hcl 40 Mg  Tabs (Quinapril Hcl) .... Take One Tablet Daily 5)  Omeprazole 20 Mg  Tbec (Omeprazole) .... Take One Tablet Daily 6)  Singulair 10 Mg  Tabs (Montelukast Sodium) .... Take One Tablet Daily 7)  Estradiol 1 Mg  Tabs (Estradiol) .... Take One Tablet Daily 8)  Torsemide 20 Mg  Tabs (Torsemide) .... Take One Tablet Daily 9)  Zyrtec Allergy 10 Mg Tabs (Cetirizine Hcl) .... Take 1 Tablet By Mouth Once A Day 10)  Qvar 80 Mcg/act  Aers (Beclomethasone Dipropionate) .... Use Once Daily As Directed 11)  Maxair Autohaler 200 Mcg/inh  Aerb (Pirbuterol Acetate) .... One Puff As Needed 12)  Patanol 0.1 %  Soln (Olopatadine Hcl) .... Use Once Daily 13)  Caltrate 600 1500 Mg  Tabs (Calcium Carbonate) .... One Tablet Daily 14)  Aspirin 81 Mg  Tbec (Aspirin) .... One Tablet Daily 15)  Amlodipine Besylate 5 Mg  Tabs (Amlodipine Besylate) .... Take One Tablet Daily 16)  Vitamin D 16109 Unit  Caps (Ergocalciferol) .Marland Kitchen.. 1 Cap 2 Times A Week 17)  Nasacort Aq 55 Mcg/act  Aers (Triamcinolone Acetonide(Nasal)) .... 2 Sprays Each Nostril Once Daily 18)  Stool Softener 100 Mg Caps (Docusate Sodium) .... Three Capsules By Mouth Daily 19)  Fiberchoice 2 Gm Chew (Inulin) .... Two Tablets By Mouth  Daily 20)  Omega-3 350 Mg Caps (Omega-3 Fatty Acids) .... One Tablet By Mouth Once Daily 21)  Vitamin E 200 Unit Caps (Vitamin E) .... Take 1 Capsule By Mouth Once A Day 22)  Co Q-10 30 Mg Caps (Coenzyme Q10) .... Take 1 Capsule By Mouth Once A Day 23)  Vit B .... Take 1 Tablet By Mouth Once A Day 24)  Mucinex Dm Maximum Strength 60-1200 Mg  Xr12h-Tab (Dextromethorphan-Guaifenesin) .... As Needed. 25)  Nasonex 50 Mcg/act Susp (Mometasone Furoate) .... One Spray in Each Nostril Once Daily As Needed. 26)  Voltaren 1 % Gel (Diclofenac Sodium) .... Apply 4 G Qid As Needed 27)  Multigen Plus 50-101-1 Mg Tabs (Feasp-Fefum -Suc-C-Thre-B12-Fa) .Marland Kitchen.. 1 By Mouth Once Daily 28)  Lovaza 1 Gm Caps (Omega-3-Acid Ethyl Esters) .... 2 By Mouth Two Times A Day 29)  Silvadene 1 % Crea (Silver Sulfadiazine) .... As Directed 30)  Hydroxyzine Hcl 25 Mg Tabs (Hydroxyzine Hcl) .Marland Kitchen.. 1 By Mouth Every 4 Hrs As Needed 31)  Amoxicillin 875 Mg Tabs (Amoxicillin) .Marland Kitchen.. 1 By Mouth Once Daily X10days 32)  Fluocinonide 0.05 % Crea (Fluocinonide) .... Apply Qid As Needed 33)  Diabetic Shoes .... Dx  Dm Ii,  Diabetic Peripheral Neruopathy 34)  Crestor 5 Mg Tabs (Rosuvastatin Calcium) .Marland Kitchen.. 1 By Mouth Every Other Day  Allergies (verified): 1)  !  Ampicillin (Ampicillin) 2)  ! Synvisc (Hylan)  Past History:  Past Medical History: Last updated: 05/07/2010 Allergic rhinitis Diabetes mellitus, type II Hyperlipidemia Hypertension Osteopenia Peripheral neuropathy OSA      - PSG 03/05/10 RDI 9, REM predominant      - CPAP 10 cm H2O  Past Surgical History: Last updated: 01/05/2009 STRESS TEST 09/2002, 07/09/2004 No Orthopedic surgery Hysterectomy and L ovary  Family History: Last updated: 12/04/2008 Lung cancer-father brother-bone cancer multiple myeloma  Family History of Colon Cancer:Uncle and 2 first cousins  Social History: Last updated: 01/28/2010 Patient has never smoked.  Alcohol Use - no Daily Caffeine  Use Occupation: Retired  Risk Factors: Alcohol Use: 0 (02/20/2010)  Risk Factors: Smoking Status: never (02/20/2010)  Family History: Reviewed history from 12/04/2008 and no changes required. Lung cancer-father brother-bone cancer multiple myeloma  Family History of Colon Cancer:Uncle and 2 first cousins  Social History: Reviewed history from 01/28/2010 and no changes required. Patient has never smoked.  Alcohol Use - no Daily Caffeine Use Occupation: Retired  Review of Systems      See HPI  Physical Exam  General:  Well-developed,well-nourished,in no acute distress; alert,appropriate and cooperative throughout examination Cervical Nodes:  No lymphadenopathy noted Psych:  Oriented X3 and normally interactive.     Impression & Recommendations:  Problem # 1:  DIABETIC PERIPHERAL NEUROPATHY (ICD-250.60)  Her updated medication list for this problem includes:    Januvia 100 Mg Tabs (Sitagliptin phosphate) .Marland Kitchen... 1 by mouth once daily    Quinapril Hcl 40 Mg Tabs (Quinapril hcl) .Marland Kitchen... Take one tablet daily    Aspirin 81 Mg Tbec (Aspirin) ..... One tablet daily  Labs Reviewed: Creat: 0.5 (09/05/2010)    Reviewed HgBA1c results: 6.9 (09/05/2010)  6.0 (11/15/2009)  Problem # 2:  HYPERLIPIDEMIA (ICD-272.4)  Her updated medication list for this problem includes:    Lovaza 1 Gm Caps (Omega-3-acid ethyl esters) .Marland Kitchen... 2 by mouth two times a day    Crestor 5 Mg Tabs (Rosuvastatin calcium) .Marland Kitchen... 1 by mouth every other day  Labs Reviewed: SGOT: 26 (09/05/2010)   SGPT: 30 (09/05/2010)  Lipid Goals: Chol Goal: 200 (12/13/2010)   HDL Goal: 40 (12/13/2010)   LDL Goal: 100 (12/13/2010)   TG Goal: 150 (12/13/2010)  Prior 10 Yr Risk Heart Disease: 17 % (12/13/2010)   HDL:53.90 (09/05/2010), 51.40 (11/15/2009)  LDL:130 (12/11/2008)  Chol:235 (09/05/2010), 222 (11/15/2009)  Trig:101.0 (09/05/2010), 120.0 (11/15/2009)  Problem # 3:  HYPERTENSION (ICD-401.9)  Her updated medication  list for this problem includes:    Quinapril Hcl 40 Mg Tabs (Quinapril hcl) .Marland Kitchen... Take one tablet daily    Torsemide 20 Mg Tabs (Torsemide) .Marland Kitchen... Take one tablet daily    Amlodipine Besylate 5 Mg Tabs (Amlodipine besylate) .Marland Kitchen... Take one tablet daily  BP today: 140/70 Prior BP: 136/74 (12/13/2010)  Prior 10 Yr Risk Heart Disease: 17 % (12/13/2010)  Labs Reviewed: K+: 4.8 (09/05/2010) Creat: : 0.5 (09/05/2010)   Chol: 235 (09/05/2010)   HDL: 53.90 (09/05/2010)   LDL: 130 (12/11/2008)   TG: 101.0 (09/05/2010)  Problem # 4:  DIABETES MELLITUS, TYPE II (ICD-250.00)  Her updated medication list for this problem includes:    Januvia 100 Mg Tabs (Sitagliptin phosphate) .Marland Kitchen... 1 by mouth once daily    Quinapril Hcl 40 Mg Tabs (Quinapril hcl) .Marland Kitchen... Take one tablet daily    Aspirin 81 Mg Tbec (Aspirin) ..... One tablet daily  Labs Reviewed: Creat: 0.5 (09/05/2010)    Reviewed HgBA1c results: 6.9 (09/05/2010)  6.0 (11/15/2009)  Problem # 5:  OSTEOPENIA (ICD-733.90)  Her updated medication list for this problem includes:    Caltrate 600 1500 Mg Tabs (Calcium carbonate) ..... One tablet daily    Vitamin D 16109 Unit Caps (Ergocalciferol) .Marland Kitchen... 1 cap 2 times a week  Vit D:48 (09/05/2010), 27 (11/15/2009)  Complete Medication List: 1)  Hydrocodone-acetaminophen 7.5-750 Mg Tabs (Hydrocodone-acetaminophen) .Marland Kitchen.. 1 by mouth every 6 hours as needed 2)  Januvia 100 Mg Tabs (Sitagliptin phosphate) .Marland Kitchen.. 1 by mouth once daily 3)  Lyrica 50 Mg Caps (Pregabalin) .Marland Kitchen.. 1po three times a day 4)  Quinapril Hcl 40 Mg Tabs (Quinapril hcl) .... Take one tablet daily 5)  Omeprazole 20 Mg Tbec (Omeprazole) .... Take one tablet daily 6)  Singulair 10 Mg Tabs (Montelukast sodium) .... Take one tablet daily 7)  Estradiol 1 Mg Tabs (Estradiol) .... Take one tablet daily 8)  Torsemide 20 Mg Tabs (Torsemide) .... Take one tablet daily 9)  Zyrtec Allergy 10 Mg Tabs (Cetirizine hcl) .... Take 1 tablet by mouth once a  day 10)  Qvar 80 Mcg/act Aers (Beclomethasone dipropionate) .... Use once daily as directed 11)  Maxair Autohaler 200 Mcg/inh Aerb (Pirbuterol acetate) .... One puff as needed 12)  Patanol 0.1 % Soln (Olopatadine hcl) .... Use once daily 13)  Caltrate 600 1500 Mg Tabs (Calcium carbonate) .... One tablet daily 14)  Aspirin 81 Mg Tbec (Aspirin) .... One tablet daily 15)  Amlodipine Besylate 5 Mg Tabs (Amlodipine besylate) .... Take one tablet daily 16)  Vitamin D 60454 Unit Caps (Ergocalciferol) .Marland Kitchen.. 1 cap 2 times a week 17)  Nasacort Aq 55 Mcg/act Aers (Triamcinolone acetonide(nasal)) .... 2 sprays each nostril once daily 18)  Stool Softener 100 Mg Caps (Docusate sodium) .... Three capsules by mouth daily 19)  Fiberchoice 2 Gm Chew (Inulin) .... Two tablets by mouth daily 20)  Omega-3 350 Mg Caps (Omega-3 fatty acids) .... One tablet by mouth once daily 21)  Vitamin E 200 Unit Caps (Vitamin e) .... Take 1 capsule by mouth once a day 22)  Co Q-10 30 Mg Caps (Coenzyme q10) .... Take 1 capsule by mouth once a day 23)  Vit B  .... Take 1 tablet by mouth once a day 24)  Mucinex Dm Maximum Strength 60-1200 Mg Xr12h-tab (Dextromethorphan-guaifenesin) .... As needed. 25)  Nasonex 50 Mcg/act Susp (Mometasone furoate) .... One spray in each nostril once daily as needed. 26)  Voltaren 1 % Gel (Diclofenac sodium) .... Apply 4 g qid as needed 27)  Multigen Plus 50-101-1 Mg Tabs (Feasp-fefum -suc-c-thre-b12-fa) .Marland Kitchen.. 1 by mouth once daily 28)  Lovaza 1 Gm Caps (Omega-3-acid ethyl esters) .... 2 by mouth two times a day 29)  Silvadene 1 % Crea (Silver sulfadiazine) .... As directed 30)  Hydroxyzine Hcl 25 Mg Tabs (Hydroxyzine hcl) .Marland Kitchen.. 1 by mouth every 4 hrs as needed 31)  Amoxicillin 875 Mg Tabs (Amoxicillin) .Marland Kitchen.. 1 by mouth once daily x10days 32)  Fluocinonide 0.05 % Crea (Fluocinonide) .... Apply qid as needed 33)  Diabetic Shoes  .... Dx  dm ii,  diabetic peripheral neruopathy 34)  Crestor 5 Mg Tabs  (Rosuvastatin calcium) .Marland Kitchen.. 1 by mouth every other day  Patient Instructions: 1)  repeat labs 3 months  272.4  250.00  lipid, hep, bmp , hgba1c Prescriptions: JANUVIA 100 MG TABS (SITAGLIPTIN PHOSPHATE) 1 by mouth once daily  #90 x 3   Entered and Authorized by:   Loreen Freud DO   Signed by:  Loreen Freud DO on 12/19/2010   Method used:   Print then Give to Patient   RxID:   [id number] CRESTOR 5 MG TABS (ROSUVASTATIN CALCIUM) 1 by mouth every other day  #45 x 3   Entered and Authorized by:   Loreen Freud DO   Signed by:   Loreen Freud DO on 12/19/2010   Method used:   Print then Give to Patient   RxID:   [id number] DIABETIC SHOES dx  dm II,  diabetic peripheral neruopathy  #1 x 0   Entered and Authorized by:   Loreen Freud DO   Signed by:   Loreen Freud DO on 12/19/2010   Method used:   Print then Give to Patient   RxID:   [id number]    Orders Added: 1)  Est. Patient Level III [64403]

## 2011-01-30 NOTE — Assessment & Plan Note (Signed)
Summary: needs med refill, has rash, swelling legs and ankles, wants -...   Vital Signs:  Patient profile:   70 year old female Weight:      206.4 pounds Temp:     97.9 degrees F oral BP sitting:   136 / 74  (right arm) Cuff size:   large  Vitals Entered By: Almeta Monas CMA Duncan Dull) (December 13, 2010 10:38 AM) CC: needs refills and wnats to discuss meds--lyrica causing swelling and itching in the legs., Lipid Management   History of Present Illness: Pt here for med refill and c/o rash on legs that actually looks better today.  She also states Lyrica is causing swelling.  She has tried neurontin and it caused tremors.    Lipid Management History:      Positive NCEP/ATP III risk factors include female age 22 years old or older, diabetes, and hypertension.  Negative NCEP/ATP III risk factors include non-tobacco-user status.    Current Medications (verified): 1)  Hydrocodone-Acetaminophen 7.5-750 Mg Tabs (Hydrocodone-Acetaminophen) .Marland Kitchen.. 1 By Mouth Every 6 Hours As Needed 2)  Actos 45 Mg Tabs (Pioglitazone Hcl) .... Take One Tablet Daily 3)  Lyrica 50 Mg Caps (Pregabalin) .Marland Kitchen.. 1po Three Times A Day 4)  Quinapril Hcl 40 Mg  Tabs (Quinapril Hcl) .... Take One Tablet Daily 5)  Omeprazole 20 Mg  Tbec (Omeprazole) .... Take One Tablet Daily 6)  Singulair 10 Mg  Tabs (Montelukast Sodium) .... Take One Tablet Daily 7)  Estradiol 1 Mg  Tabs (Estradiol) .... Take One Tablet Daily 8)  Torsemide 20 Mg  Tabs (Torsemide) .... Take One Tablet Daily 9)  Zyrtec Allergy 10 Mg Tabs (Cetirizine Hcl) .... Take 1 Tablet By Mouth Once A Day 10)  Qvar 80 Mcg/act  Aers (Beclomethasone Dipropionate) .... Use Once Daily As Directed 11)  Maxair Autohaler 200 Mcg/inh  Aerb (Pirbuterol Acetate) .... One Puff As Needed 12)  Patanol 0.1 %  Soln (Olopatadine Hcl) .... Use Once Daily 13)  Caltrate 600 1500 Mg  Tabs (Calcium Carbonate) .... One Tablet Daily 14)  Aspirin 81 Mg  Tbec (Aspirin) .... One Tablet Daily 15)   Amlodipine Besylate 5 Mg  Tabs (Amlodipine Besylate) .... Take One Tablet Daily 16)  Vitamin D 52841 Unit  Caps (Ergocalciferol) .Marland Kitchen.. 1 Cap 2 Times A Week 17)  Nasacort Aq 55 Mcg/act  Aers (Triamcinolone Acetonide(Nasal)) .... 2 Sprays Each Nostril Once Daily 18)  Stool Softener 100 Mg Caps (Docusate Sodium) .... Three Capsules By Mouth Daily 19)  Fiberchoice 2 Gm Chew (Inulin) .... Two Tablets By Mouth Daily 20)  Omega-3 350 Mg Caps (Omega-3 Fatty Acids) .... One Tablet By Mouth Once Daily 21)  Vitamin E 200 Unit Caps (Vitamin E) .... Take 1 Capsule By Mouth Once A Day 22)  Co Q-10 30 Mg Caps (Coenzyme Q10) .... Take 1 Capsule By Mouth Once A Day 23)  Vit B .... Take 1 Tablet By Mouth Once A Day 24)  Mucinex Dm Maximum Strength 60-1200 Mg  Xr12h-Tab (Dextromethorphan-Guaifenesin) .... As Needed. 25)  Nasonex 50 Mcg/act Susp (Mometasone Furoate) .... One Spray in Each Nostril Once Daily As Needed. 26)  Voltaren 1 % Gel (Diclofenac Sodium) .... Apply 4 G Qid As Needed 27)  Multigen Plus 50-101-1 Mg Tabs (Feasp-Fefum -Suc-C-Thre-B12-Fa) .Marland Kitchen.. 1 By Mouth Once Daily 28)  Lovaza 1 Gm Caps (Omega-3-Acid Ethyl Esters) .... 2 By Mouth Two Times A Day 29)  Silvadene 1 % Crea (Silver Sulfadiazine) .... As Directed 30)  Hydroxyzine Hcl  25 Mg Tabs (Hydroxyzine Hcl) .Marland Kitchen.. 1 By Mouth Every 4 Hrs As Needed 31)  Amoxicillin 875 Mg Tabs (Amoxicillin) .Marland Kitchen.. 1 By Mouth Once Daily X10days 32)  Fluocinonide 0.05 % Crea (Fluocinonide) .... Apply Qid As Needed  Allergies (verified): 1)  ! Ampicillin (Ampicillin) 2)  ! Synvisc (Hylan)  Past History:  Past Medical History: Last updated: 05/07/2010 Allergic rhinitis Diabetes mellitus, type II Hyperlipidemia Hypertension Osteopenia Peripheral neuropathy OSA      - PSG 03/05/10 RDI 9, REM predominant      - CPAP 10 cm H2O  Past Surgical History: Last updated: 01/05/2009 STRESS TEST 09/2002, 07/09/2004 No Orthopedic surgery Hysterectomy and L  ovary  Family History: Last updated: 12/04/2008 Lung cancer-father brother-bone cancer multiple myeloma  Family History of Colon Cancer:Uncle and 2 first cousins  Social History: Last updated: 01/28/2010 Patient has never smoked.  Alcohol Use - no Daily Caffeine Use Occupation: Retired  Risk Factors: Alcohol Use: 0 (02/20/2010)  Risk Factors: Smoking Status: never (02/20/2010)  Family History: Reviewed history from 12/04/2008 and no changes required. Lung cancer-father brother-bone cancer multiple myeloma  Family History of Colon Cancer:Uncle and 2 first cousins  Social History: Reviewed history from 01/28/2010 and no changes required. Patient has never smoked.  Alcohol Use - no Daily Caffeine Use Occupation: Retired  Review of Systems      See HPI  Physical Exam  General:  Well-developed,well-nourished,in no acute distress; alert,appropriate and cooperative throughout examination Lungs:  Normal respiratory effort, chest expands symmetrically. Lungs are clear to auscultation, no crackles or wheezes. Heart:  normal rate.   Msk:  No deformity or scoliosis noted of thoracic or lumbar spine.   Extremities:  No clubbing, cyanosis, edema, or deformity noted with normal full range of motion of all joints.   Neurologic:  No cranial nerve deficits noted. Station and gait are normal. Plantar reflexes are down-going bilaterally. DTRs are symmetrical throughout. Sensory, motor and coordinative functions appear intact. Skin:  + escoriations and dry skin with some paplules b/l LE Psych:  Cognition and judgment appear intact. Alert and cooperative with normal attention span and concentration. No apparent delusions, illusions, hallucinations   Impression & Recommendations:  Problem # 1:  SKIN RASH (ICD-782.1)  Her updated medication list for this problem includes:    Fluocinonide 0.05 % Crea (Fluocinonide) .Marland Kitchen... Apply qid as needed  Discussed medication use and symptom  control.   Problem # 2:  VITAMIN D DEFICIENCY (ICD-268.9)  Problem # 3:  PERIPHERAL NEUROPATHY (ICD-356.9) Assessment: Improved lyrica 50 mg three times a day  f/u neuro  Problem # 4:  OTHER SPECIFIED DISEASE OF WHITE BLOOD CELLS (ICD-288.8)  Problem # 5:  GERD (ICD-530.81) Assessment: Improved  Her updated medication list for this problem includes:    Omeprazole 20 Mg Tbec (Omeprazole) .Marland Kitchen... Take one tablet daily  Diagnostics Reviewed:  EGD: DONE (01/29/2010) Discussed lifestyle modifications, diet, antacids/medications, and preventive measures. Handout provided.   Problem # 6:  DIABETES MELLITUS, TYPE II (ICD-250.00) Assessment: Improved  Her updated medication list for this problem includes:    Actos 45 Mg Tabs (Pioglitazone hcl) .Marland Kitchen... Take one tablet daily    Quinapril Hcl 40 Mg Tabs (Quinapril hcl) .Marland Kitchen... Take one tablet daily    Aspirin 81 Mg Tbec (Aspirin) ..... One tablet daily  Labs Reviewed: Creat: 0.5 (09/05/2010)    Reviewed HgBA1c results: 6.9 (09/05/2010)  6.0 (11/15/2009)  Problem # 7:  HYPERLIPIDEMIA (ICD-272.4)  Her updated medication list for this problem includes:  Lovaza 1 Gm Caps (Omega-3-acid ethyl esters) .Marland Kitchen... 2 by mouth two times a day  Labs Reviewed: SGOT: 26 (09/05/2010)   SGPT: 30 (09/05/2010)  Lipid Goals: Chol Goal: 200 (12/13/2010)   HDL Goal: 40 (12/13/2010)   LDL Goal: 100 (12/13/2010)   TG Goal: 150 (12/13/2010)  10 Yr Risk Heart Disease: 17 %   HDL:53.90 (09/05/2010), 51.40 (11/15/2009)  LDL:130 (12/11/2008)  Chol:235 (09/05/2010), 222 (11/15/2009)  Trig:101.0 (09/05/2010), 120.0 (11/15/2009)  Problem # 8:  HYPERTENSION (ICD-401.9)  Her updated medication list for this problem includes:    Quinapril Hcl 40 Mg Tabs (Quinapril hcl) .Marland Kitchen... Take one tablet daily    Torsemide 20 Mg Tabs (Torsemide) .Marland Kitchen... Take one tablet daily    Amlodipine Besylate 5 Mg Tabs (Amlodipine besylate) .Marland Kitchen... Take one tablet daily  BP today: 136/74 Prior  BP: 130/70 (10/15/2010)  10 Yr Risk Heart Disease: 17 %  Labs Reviewed: K+: 4.8 (09/05/2010) Creat: : 0.5 (09/05/2010)   Chol: 235 (09/05/2010)   HDL: 53.90 (09/05/2010)   LDL: 130 (12/11/2008)   TG: 101.0 (09/05/2010)  Complete Medication List: 1)  Hydrocodone-acetaminophen 7.5-750 Mg Tabs (Hydrocodone-acetaminophen) .Marland Kitchen.. 1 by mouth every 6 hours as needed 2)  Actos 45 Mg Tabs (Pioglitazone hcl) .... Take one tablet daily 3)  Lyrica 50 Mg Caps (Pregabalin) .Marland Kitchen.. 1po three times a day 4)  Quinapril Hcl 40 Mg Tabs (Quinapril hcl) .... Take one tablet daily 5)  Omeprazole 20 Mg Tbec (Omeprazole) .... Take one tablet daily 6)  Singulair 10 Mg Tabs (Montelukast sodium) .... Take one tablet daily 7)  Estradiol 1 Mg Tabs (Estradiol) .... Take one tablet daily 8)  Torsemide 20 Mg Tabs (Torsemide) .... Take one tablet daily 9)  Zyrtec Allergy 10 Mg Tabs (Cetirizine hcl) .... Take 1 tablet by mouth once a day 10)  Qvar 80 Mcg/act Aers (Beclomethasone dipropionate) .... Use once daily as directed 11)  Maxair Autohaler 200 Mcg/inh Aerb (Pirbuterol acetate) .... One puff as needed 12)  Patanol 0.1 % Soln (Olopatadine hcl) .... Use once daily 13)  Caltrate 600 1500 Mg Tabs (Calcium carbonate) .... One tablet daily 14)  Aspirin 81 Mg Tbec (Aspirin) .... One tablet daily 15)  Amlodipine Besylate 5 Mg Tabs (Amlodipine besylate) .... Take one tablet daily 16)  Vitamin D 16109 Unit Caps (Ergocalciferol) .Marland Kitchen.. 1 cap 2 times a week 17)  Nasacort Aq 55 Mcg/act Aers (Triamcinolone acetonide(nasal)) .... 2 sprays each nostril once daily 18)  Stool Softener 100 Mg Caps (Docusate sodium) .... Three capsules by mouth daily 19)  Fiberchoice 2 Gm Chew (Inulin) .... Two tablets by mouth daily 20)  Omega-3 350 Mg Caps (Omega-3 fatty acids) .... One tablet by mouth once daily 21)  Vitamin E 200 Unit Caps (Vitamin e) .... Take 1 capsule by mouth once a day 22)  Co Q-10 30 Mg Caps (Coenzyme q10) .... Take 1 capsule by  mouth once a day 23)  Vit B  .... Take 1 tablet by mouth once a day 24)  Mucinex Dm Maximum Strength 60-1200 Mg Xr12h-tab (Dextromethorphan-guaifenesin) .... As needed. 25)  Nasonex 50 Mcg/act Susp (Mometasone furoate) .... One spray in each nostril once daily as needed. 26)  Voltaren 1 % Gel (Diclofenac sodium) .... Apply 4 g qid as needed 27)  Multigen Plus 50-101-1 Mg Tabs (Feasp-fefum -suc-c-thre-b12-fa) .Marland Kitchen.. 1 by mouth once daily 28)  Lovaza 1 Gm Caps (Omega-3-acid ethyl esters) .... 2 by mouth two times a day 29)  Silvadene 1 % Crea (Silver  sulfadiazine) .... As directed 30)  Hydroxyzine Hcl 25 Mg Tabs (Hydroxyzine hcl) .Marland Kitchen.. 1 by mouth every 4 hrs as needed 31)  Amoxicillin 875 Mg Tabs (Amoxicillin) .Marland Kitchen.. 1 by mouth once daily x10days 32)  Fluocinonide 0.05 % Crea (Fluocinonide) .... Apply qid as needed  Lipid Assessment/Plan:      Based on NCEP/ATP III, the patient's risk factor category is "history of diabetes".  The patient's lipid goals are as follows: Total cholesterol goal is 200; LDL cholesterol goal is 100; HDL cholesterol goal is 40; Triglyceride goal is 150.     Patient Instructions: 1)  fasting labs  250.00  401.9  272.4   bmp, hep, lipid, hgba1c 285.9  cbcd, b12,  vita D---vita D deficiency----ov 1-2 weeks after labs Prescriptions: LYRICA 50 MG CAPS (PREGABALIN) 1po three times a day  #21 x 0   Entered and Authorized by:   Loreen Freud DO   Signed by:   Loreen Freud DO on 12/13/2010   Method used:   Print then Give to Patient   RxID:   [id number] HYDROCODONE-ACETAMINOPHEN 7.5-750 MG TABS (HYDROCODONE-ACETAMINOPHEN) 1 by mouth every 6 hours as needed  #120 x 0   Entered and Authorized by:   Loreen Freud DO   Signed by:   Loreen Freud DO on 12/13/2010   Method used:   Print then Give to Patient   RxID:   [id number] FLUOCINONIDE 0.05 % CREA (FLUOCINONIDE) apply qid as needed  #30g x 2   Entered and Authorized by:   Loreen Freud DO   Signed by:   Loreen Freud  DO on 12/13/2010   Method used:   Electronically to        [facility] #[id number]* (retail)       [address] [place], [place]  [address]       Ph: [phone]       Fax: [phone]   RxID:   [id number] TORSEMIDE 20 MG  TABS (TORSEMIDE) take one tablet daily  #90 Each x 3   Entered and Authorized by:   Loreen Freud DO   Signed by:   Loreen Freud DO on 12/13/2010   Method used:   Electronically to        [facility] #[id number]* (retail)       [address] [place], [place]  [address]       Ph: [phone]       Fax: [phone]   RxID:   [id number] OMEPRAZOLE 20 MG  TBEC (OMEPRAZOLE) take one tablet daily  #90 Each x 3   Entered and Authorized by:   Loreen Freud DO   Signed by:   Loreen Freud DO on 12/13/2010   Method used:   Electronically to        [facility] #[id number]* (retail)       [address] [place], [place]  [address]       Ph: [phone]       Fax: [phone]   RxID:   [id number] AMLODIPINE BESYLATE 5 MG  TABS (AMLODIPINE BESYLATE) take one tablet daily  #90 x 3   Entered and Authorized by:   Loreen Freud DO   Signed by:   Loreen Freud DO on 12/13/2010   Method used:   Faxed to ...       [facility] (mail-order)             ,  [place]         Ph: [phone]       Fax: [phone]   RxID:   [id number] ESTRADIOL 1 MG  TABS (ESTRADIOL) take one tablet daily  #90 x 3   Entered and Authorized by:   Loreen Freud DO   Signed by:   Loreen Freud DO on 12/13/2010   Method used:   Faxed to ...       [facility] (mail-order)             , [place]         Ph: [phone]       Fax: [phone]   RxID:   [id number] QUINAPRIL HCL 40 MG  TABS (QUINAPRIL HCL) take one tablet daily  #90 x 3   Entered and Authorized by:   Loreen Freud DO   Signed by:   Loreen Freud DO on 12/13/2010   Method used:   Faxed to ...       [facility] (mail-order)             , [place]         Ph: [phone]       Fax: [phone]   RxID:    [id number]    Orders Added: 1)  Est. Patient Level IV [76283]

## 2011-01-30 NOTE — Medication Information (Signed)
Summary: Care Consideration Regarding Actos & Edema/[facility]  Care Consideration Regarding [facility]   Imported By: Lanelle Bal 01/06/2011 13:00:54  _____________________________________________________________________  External Attachment:    Type:   Image     Comment:   External Document

## 2011-02-03 ENCOUNTER — Telehealth: Payer: Self-pay | Admitting: Family Medicine

## 2011-02-05 NOTE — Progress Notes (Signed)
Summary: Refill  Phone Note Refill Request Call back at [phone] Message from:  Pharmacy on January 27, 2011 2:00 PM  Refills Requested: Medication #1:  HYDROCODONE-ACETAMINOPHEN 7.5-750 MG TABS 1 by mouth every 6 hours as needed   Dosage confirmed as above?Dosage Confirmed   Supply Requested: 1 month   Last Refilled: 12/13/2010 [facility]   Next Appointment Scheduled: none Initial call taken by: Lavell Islam,  January 27, 2011 2:01 PM  Follow-up for Phone Call        last seen 12/19/10 and filled 12/13/10 please advise Follow-up by: Almeta Monas CMA Duncan Dull),  January 27, 2011 2:56 PM  Additional Follow-up for Phone Call Additional follow up Details #1::        refill x1 Additional Follow-up by: Loreen Freud DO,  January 27, 2011 5:01 PM    Prescriptions: HYDROCODONE-ACETAMINOPHEN 7.5-750 MG TABS (HYDROCODONE-ACETAMINOPHEN) 1 by mouth every 6 hours as needed  #120 x 0   Entered by:   Doristine Devoid CMA   Authorized by:   Loreen Freud DO   Signed by:   Doristine Devoid CMA on 01/28/2011   Method used:   Printed then faxed to ...       [facility] [address] (retail)       [address] [place], [place]  [address]       Ph: [phone]       Fax: [phone]   RxID:   [id number]

## 2011-02-05 NOTE — Progress Notes (Signed)
Summary: refill - change dose   Phone Note Refill Request Call back at Home Phone [phone] Message from:  Patient on January 29, 2011 1:27 PM  Refills Requested: Medication #1:  LYRICA 50 MG CAPS 1po three times a day patient was taking lyrica 75mg  twice a day - it was changed to 50mg  3 times a day - patient said she wants rx for 75mg  twice a day she felt better with that dose - [facility] [address] - 30 day supply  Initial call taken by: Okey Regal [place],  January 29, 2011 1:29 PM  Follow-up for Phone Call        please advise.... Almeta Monas CMA Duncan Dull)  January 29, 2011 1:30 PM   Additional Follow-up for Phone Call Additional follow up Details #1::        ok to go back to 75 two times a day--- if she feels like she needs more we can increase to 2 bid Additional Follow-up by: Loreen Freud DO,  January 29, 2011 2:57 PM    Additional Follow-up for Phone Call Additional follow up Details #2::    Vm left making patient aware Rx faxed to pharm... Almeta Monas CMA Duncan Dull)  January 29, 2011 3:58 PM   New/Updated Medications: LYRICA 75 MG CAPS (PREGABALIN) 1 by mouth two times a day Prescriptions: LYRICA 75 MG CAPS (PREGABALIN) 1 by mouth two times a day  #60 x 0   Entered by:   Almeta Monas CMA (AAMA)   Authorized by:   Loreen Freud DO   Signed by:   Almeta Monas CMA (AAMA) on 01/29/2011   Method used:   Printed then faxed to ...       [id number]* (retail)       [address] [place], [place]  [address]       Ph: [phone]       Fax: [phone]   RxID:   [id number]

## 2011-02-12 ENCOUNTER — Ambulatory Visit (INDEPENDENT_AMBULATORY_CARE_PROVIDER_SITE_OTHER): Payer: Medicare Other | Admitting: Family Medicine

## 2011-02-12 ENCOUNTER — Encounter: Payer: Self-pay | Admitting: Family Medicine

## 2011-02-12 DIAGNOSIS — M25519 Pain in unspecified shoulder: Secondary | ICD-10-CM

## 2011-02-12 DIAGNOSIS — M545 Low back pain: Secondary | ICD-10-CM

## 2011-02-12 LAB — CONVERTED CEMR LAB
Bilirubin Urine: NEGATIVE
Blood in Urine, dipstick: NEGATIVE
Ketones, urine, test strip: NEGATIVE
Nitrite: NEGATIVE
Specific Gravity, Urine: <1.005
Urobilinogen, UA: 0.2

## 2011-02-13 NOTE — Progress Notes (Signed)
Summary: Lyrica rx  Phone Note Refill Request Message from:  Patient on February 03, 2011 2:31 PM  Refills Requested: Medication #1:  LYRICA 75 MG CAPS 1 by mouth two times a day Per patient the pharmacy states her prescription was denied. I made her aware they are trying to fill the 50mg  dose (the wrong dose). I spoke with the pharmacy and they denied receiving prescription on 2/1. It was provided via phone.  Initial call taken by: Lucious Groves CMA,  February 03, 2011 2:31 PM    Prescriptions: LYRICA 75 MG CAPS (PREGABALIN) 1 by mouth two times a day  #60 x 0   Entered by:   Lucious Groves CMA   Authorized by:   Loreen Freud DO   Signed by:   Lucious Groves CMA on 02/03/2011   Method used:   Telephoned to ...       [id number]* (retail)       [address] [place], [place]  [address]       Ph: [phone]       Fax: [phone]   RxID:   [id number]

## 2011-02-14 ENCOUNTER — Other Ambulatory Visit: Payer: Self-pay | Admitting: Orthopedic Surgery

## 2011-02-14 ENCOUNTER — Telehealth: Payer: Self-pay | Admitting: Family Medicine

## 2011-02-14 DIAGNOSIS — M549 Dorsalgia, unspecified: Secondary | ICD-10-CM

## 2011-02-14 DIAGNOSIS — M2550 Pain in unspecified joint: Secondary | ICD-10-CM | POA: Insufficient documentation

## 2011-02-14 DIAGNOSIS — IMO0001 Reserved for inherently not codable concepts without codable children: Secondary | ICD-10-CM | POA: Insufficient documentation

## 2011-02-17 ENCOUNTER — Other Ambulatory Visit (INDEPENDENT_AMBULATORY_CARE_PROVIDER_SITE_OTHER): Payer: Medicare Other

## 2011-02-17 ENCOUNTER — Encounter: Payer: Self-pay | Admitting: Family Medicine

## 2011-02-17 ENCOUNTER — Telehealth (INDEPENDENT_AMBULATORY_CARE_PROVIDER_SITE_OTHER): Payer: Self-pay | Admitting: *Deleted

## 2011-02-17 ENCOUNTER — Other Ambulatory Visit: Payer: Self-pay | Admitting: Family Medicine

## 2011-02-17 ENCOUNTER — Encounter (INDEPENDENT_AMBULATORY_CARE_PROVIDER_SITE_OTHER): Payer: Self-pay | Admitting: *Deleted

## 2011-02-17 DIAGNOSIS — E785 Hyperlipidemia, unspecified: Secondary | ICD-10-CM

## 2011-02-17 DIAGNOSIS — E1149 Type 2 diabetes mellitus with other diabetic neurological complication: Secondary | ICD-10-CM

## 2011-02-17 DIAGNOSIS — IMO0001 Reserved for inherently not codable concepts without codable children: Secondary | ICD-10-CM

## 2011-02-17 DIAGNOSIS — M2550 Pain in unspecified joint: Secondary | ICD-10-CM

## 2011-02-17 LAB — HEPATIC FUNCTION PANEL
ALT: 13 U/L (ref 0–35)
AST: 15 U/L (ref 0–37)
Albumin: 3.7 g/dL (ref 3.5–5.2)
Alkaline Phosphatase: 64 U/L (ref 39–117)
Bilirubin, Direct: 0.1 mg/dL (ref 0.0–0.3)
Total Bilirubin: 0.3 mg/dL (ref 0.3–1.2)
Total Protein: 6 g/dL (ref 6.0–8.3)

## 2011-02-17 LAB — HEMOGLOBIN A1C: Hgb A1c MFr Bld: 6.6 % — ABNORMAL HIGH (ref 4.6–6.5)

## 2011-02-17 LAB — MICROALBUMIN / CREATININE URINE RATIO
Creatinine,U: 124.6 mg/dL
Microalb Creat Ratio: 1.8 mg/g (ref 0.0–30.0)
Microalb, Ur: 2.3 mg/dL — ABNORMAL HIGH (ref 0.0–1.9)

## 2011-02-17 LAB — BASIC METABOLIC PANEL
Calcium: 9.4 mg/dL (ref 8.4–10.5)
Chloride: 101 mEq/L (ref 96–112)
Creatinine, Ser: 0.7 mg/dL (ref 0.4–1.2)
GFR: 100.61 mL/min (ref >60.00)

## 2011-02-17 LAB — LIPID PANEL
Cholesterol: 219 mg/dL — ABNORMAL HIGH (ref 0–200)
Triglycerides: 114 mg/dL (ref 0.0–149.0)

## 2011-02-17 LAB — SEDIMENTATION RATE: Sed Rate: 23 mm/hr — ABNORMAL HIGH (ref 0–22)

## 2011-02-17 LAB — LDL CHOLESTEROL, DIRECT: Direct LDL: 157.2 mg/dL

## 2011-02-18 ENCOUNTER — Other Ambulatory Visit: Payer: Medicare Other

## 2011-02-18 ENCOUNTER — Ambulatory Visit
Admission: RE | Admit: 2011-02-18 | Discharge: 2011-02-18 | Disposition: A | Discharge status: Home / Self Care | Payer: Medicare Other | Source: Ambulatory Visit | Attending: Orthopedic Surgery | Admitting: Orthopedic Surgery

## 2011-02-18 DIAGNOSIS — M549 Dorsalgia, unspecified: Secondary | ICD-10-CM

## 2011-02-19 NOTE — Assessment & Plan Note (Signed)
Summary: back prob--hurts to stand, bend, sit--in bed with heat, then ...   Vital Signs:  Patient profile:   70 year old female Weight:      205.8 pounds Temp:     98.2 degrees F oral Pulse rate:   76 / minute Pulse rhythm:   regular BP sitting:   144 / 72  (left arm) Cuff size:   large  Vitals Entered By: Almeta Monas CMA Duncan Dull) (February 12, 2011 8:17 AM) CC: c/o a flare of of body pain--pain is worst in the back Pain Assessment Patient in pain? yes     Location: lower back Intensity: 15 Type: sharp Onset of pain  Chronic   History of Present Illness: Pt here c/o mid and low back pain for about 2 weeks.  No known injury.   Pain is worse in the am.  Hurts to turn in bed, to walk.  Pt states pain in low back radiates down R leg.  pt had injection in back in December by [place] [facility] and she states that one was different---it felt different and she did not get the same results.  The pain across shoulder blades started in March but has not improved even with PT. Pt has seen orthopedic for both problems.    Current Medications (verified): 1)  Hydrocodone-Acetaminophen 7.5-750 Mg Tabs (Hydrocodone-Acetaminophen) .Marland Kitchen.. 1 By Mouth Every 6 Hours As Needed 2)  Januvia 100 Mg Tabs (Sitagliptin Phosphate) .Marland Kitchen.. 1 By Mouth Once Daily 3)  Lyrica 75 Mg Caps (Pregabalin) .Marland Kitchen.. 1 By Mouth Two Times A Day 4)  Quinapril Hcl 40 Mg  Tabs (Quinapril Hcl) .... Take One Tablet Daily 5)  Omeprazole 20 Mg  Tbec (Omeprazole) .... Take One Tablet Daily 6)  Singulair 10 Mg  Tabs (Montelukast Sodium) .... Take One Tablet Daily 7)  Estradiol 1 Mg  Tabs (Estradiol) .... Take One Tablet Daily 8)  Torsemide 20 Mg  Tabs (Torsemide) .... Take One Tablet Daily 9)  Zyrtec Allergy 10 Mg Tabs (Cetirizine Hcl) .... Take 1 Tablet By Mouth Once A Day 10)  Qvar 80 Mcg/act  Aers (Beclomethasone Dipropionate) .... Use Once Daily As Directed 11)  Maxair Autohaler 200 Mcg/inh  Aerb (Pirbuterol Acetate) .... One Puff As  Needed 12)  Patanol 0.1 %  Soln (Olopatadine Hcl) .... Use Once Daily 13)  Caltrate 600 1500 Mg  Tabs (Calcium Carbonate) .... One Tablet Daily 14)  Aspirin 81 Mg  Tbec (Aspirin) .... One Tablet Daily 15)  Amlodipine Besylate 5 Mg  Tabs (Amlodipine Besylate) .... Take One Tablet Daily 16)  Vitamin D 29528 Unit  Caps (Ergocalciferol) .Marland Kitchen.. 1 Cap 2 Times A Week 17)  Nasacort Aq 55 Mcg/act  Aers (Triamcinolone Acetonide(Nasal)) .... 2 Sprays Each Nostril Once Daily 18)  Stool Softener 100 Mg Caps (Docusate Sodium) .... Three Capsules By Mouth Daily 19)  Fiberchoice 2 Gm Chew (Inulin) .... Two Tablets By Mouth Daily 20)  Omega-3 350 Mg Caps (Omega-3 Fatty Acids) .... One Tablet By Mouth Once Daily 21)  Vitamin E 200 Unit Caps (Vitamin E) .... Take 1 Capsule By Mouth Once A Day 22)  Co Q-10 30 Mg Caps (Coenzyme Q10) .... Take 1 Capsule By Mouth Once A Day 23)  Vit B .... Take 1 Tablet By Mouth Once A Day 24)  Mucinex Dm Maximum Strength 60-1200 Mg  Xr12h-Tab (Dextromethorphan-Guaifenesin) .... As Needed. 25)  Nasonex 50 Mcg/act Susp (Mometasone Furoate) .... One Spray in Each Nostril Once Daily As Needed. 26)  Voltaren  1 % Gel (Diclofenac Sodium) .... Apply 4 G Qid As Needed 27)  Multigen Plus 50-101-1 Mg Tabs (Feasp-Fefum -Suc-C-Thre-B12-Fa) .Marland Kitchen.. 1 By Mouth Once Daily 28)  Lovaza 1 Gm Caps (Omega-3-Acid Ethyl Esters) .... 2 By Mouth Two Times A Day 29)  Silvadene 1 % Crea (Silver Sulfadiazine) .... As Directed 30)  Hydroxyzine Hcl 25 Mg Tabs (Hydroxyzine Hcl) .Marland Kitchen.. 1 By Mouth Every 4 Hrs As Needed 31)  Diabetic Shoes .... Dx  Dm Ii,  Diabetic Peripheral Neruopathy 32)  Crestor 5 Mg Tabs (Rosuvastatin Calcium) .Marland Kitchen.. 1 By Mouth Every Other Day 33)  Macrobid 100 Mg Caps (Nitrofurantoin Monohyd Macro) .Marland Kitchen.. 1 By Mouth Two Times A Day  Allergies (verified): 1)  ! Ampicillin (Ampicillin) 2)  ! Synvisc (Hylan)  Past History:  Past medical, surgical, family and social histories (including risk factors)  reviewed for relevance to current acute and chronic problems.  Past Medical History: Reviewed history from 05/07/2010 and no changes required. Allergic rhinitis Diabetes mellitus, type II Hyperlipidemia Hypertension Osteopenia Peripheral neuropathy OSA      - PSG 03/05/10 RDI 9, REM predominant      - CPAP 10 cm H2O  Past Surgical History: Reviewed history from 01/05/2009 and no changes required. STRESS TEST 09/2002, 07/09/2004 No Orthopedic surgery Hysterectomy and L ovary  Family History: Reviewed history from 12/04/2008 and no changes required. Lung cancer-father brother-bone cancer multiple myeloma  Family History of Colon Cancer:Uncle and 2 first cousins  Social History: Reviewed history from 01/28/2010 and no changes required. Patient has never smoked.  Alcohol Use - no Daily Caffeine Use Occupation: Retired  Review of Systems      See HPI  Physical Exam  General:  Well-developed,well-nourished,in no acute distress; alert,appropriate and cooperative throughout examination Msk:  + know in soft tissue L arm Extremities:  No clubbing, cyanosis, edema, or deformity noted  Pt has good strength in both low ext Psych:  Oriented X3 and normally interactive.     Impression & Recommendations:  Problem # 1:  SHOULDER PAIN, LEFT (ICD-719.41)  f/u with ortho Her updated medication list for this problem includes:    Hydrocodone-acetaminophen 7.5-750 Mg Tabs (Hydrocodone-acetaminophen) .Marland Kitchen... 1 by mouth every 6 hours as needed    Aspirin 81 Mg Tbec (Aspirin) ..... One tablet daily  Orders: UA Dipstick w/o Micro (manual) (32440)  Discussed shoulder exercises, use of moist heat or ice, and medication.   Problem # 2:  LOW BACK PAIN, CHRONIC (ICD-724.2)  f/u ortho Her updated medication list for this problem includes:    Hydrocodone-acetaminophen 7.5-750 Mg Tabs (Hydrocodone-acetaminophen) .Marland Kitchen... 1 by mouth every 6 hours as needed    Aspirin 81 Mg Tbec (Aspirin) .....  One tablet daily  Orders: UA Dipstick w/o Micro (manual) (10272)  Discussed use of moist heat or ice, modified activities, medications, and stretching/strengthening exercises. Back care instructions given. To be seen in 2 weeks if no improvement; sooner if worsening of symptoms.   Complete Medication List: 1)  Hydrocodone-acetaminophen 7.5-750 Mg Tabs (Hydrocodone-acetaminophen) .Marland Kitchen.. 1 by mouth every 6 hours as needed 2)  Januvia 100 Mg Tabs (Sitagliptin phosphate) .Marland Kitchen.. 1 by mouth once daily 3)  Lyrica 75 Mg Caps (Pregabalin) .Marland Kitchen.. 1 by mouth two times a day 4)  Quinapril Hcl 40 Mg Tabs (Quinapril hcl) .... Take one tablet daily 5)  Omeprazole 20 Mg Tbec (Omeprazole) .... Take one tablet daily 6)  Singulair 10 Mg Tabs (Montelukast sodium) .... Take one tablet daily 7)  Estradiol 1 Mg Tabs (  Estradiol) .... Take one tablet daily 8)  Torsemide 20 Mg Tabs (Torsemide) .... Take one tablet daily 9)  Zyrtec Allergy 10 Mg Tabs (Cetirizine hcl) .... Take 1 tablet by mouth once a day 10)  Qvar 80 Mcg/act Aers (Beclomethasone dipropionate) .... Use once daily as directed 11)  Maxair Autohaler 200 Mcg/inh Aerb (Pirbuterol acetate) .... One puff as needed 12)  Patanol 0.1 % Soln (Olopatadine hcl) .... Use once daily 13)  Caltrate 600 1500 Mg Tabs (Calcium carbonate) .... One tablet daily 14)  Aspirin 81 Mg Tbec (Aspirin) .... One tablet daily 15)  Amlodipine Besylate 5 Mg Tabs (Amlodipine besylate) .... Take one tablet daily 16)  Vitamin D 04540 Unit Caps (Ergocalciferol) .Marland Kitchen.. 1 cap 2 times a week 17)  Nasacort Aq 55 Mcg/act Aers (Triamcinolone acetonide(nasal)) .... 2 sprays each nostril once daily 18)  Stool Softener 100 Mg Caps (Docusate sodium) .... Three capsules by mouth daily 19)  Fiberchoice 2 Gm Chew (Inulin) .... Two tablets by mouth daily 20)  Omega-3 350 Mg Caps (Omega-3 fatty acids) .... One tablet by mouth once daily 21)  Vitamin E 200 Unit Caps (Vitamin e) .... Take 1 capsule by mouth  once a day 22)  Co Q-10 30 Mg Caps (Coenzyme q10) .... Take 1 capsule by mouth once a day 23)  Vit B  .... Take 1 tablet by mouth once a day 24)  Mucinex Dm Maximum Strength 60-1200 Mg Xr12h-tab (Dextromethorphan-guaifenesin) .... As needed. 25)  Nasonex 50 Mcg/act Susp (Mometasone furoate) .... One spray in each nostril once daily as needed. 26)  Voltaren 1 % Gel (Diclofenac sodium) .... Apply 4 g qid as needed 27)  Multigen Plus 50-101-1 Mg Tabs (Feasp-fefum -suc-c-thre-b12-fa) .Marland Kitchen.. 1 by mouth once daily 28)  Lovaza 1 Gm Caps (Omega-3-acid ethyl esters) .... 2 by mouth two times a day 29)  Silvadene 1 % Crea (Silver sulfadiazine) .... As directed 30)  Hydroxyzine Hcl 25 Mg Tabs (Hydroxyzine hcl) .Marland Kitchen.. 1 by mouth every 4 hrs as needed 31)  Diabetic Shoes  .... Dx  dm ii,  diabetic peripheral neruopathy 32)  Crestor 5 Mg Tabs (Rosuvastatin calcium) .Marland Kitchen.. 1 by mouth every other day 33)  Macrobid 100 Mg Caps (Nitrofurantoin monohyd macro) .Marland Kitchen.. 1 by mouth two times a day  Other Orders: T-Culture, Urine ([id number]) Prescriptions: MACROBID 100 MG CAPS (NITROFURANTOIN MONOHYD MACRO) 1 by mouth two times a day  #14 x 0   Entered and Authorized by:   Loreen Freud DO   Signed by:   Loreen Freud DO on 02/12/2011   Method used:   Print then Give to Patient   RxID:   [id number]    Orders Added: 1)  T-Culture, Urine [[id number]] 2)  Est. Patient Level III [40102] 3)  UA Dipstick w/o Micro (manual) [81002]    Laboratory Results   Urine Tests    Routine Urinalysis   Color: yellow Appearance: Cloudy Glucose: negative   (Normal Range: Negative) Bilirubin: negative   (Normal Range: Negative) Ketone: negative   (Normal Range: Negative) Spec. Gravity: <1.005   (Normal Range: 1.003-1.035) Blood: negative   (Normal Range: Negative) pH: 7.5   (Normal Range: 5.0-8.0) Protein: negative   (Normal Range: Negative) Urobilinogen: 0.2   (Normal Range: 0-1) Nitrite: negative   (Normal Range:  Negative) Leukocyte Esterace: moderate   (Normal Range: Negative)

## 2011-02-19 NOTE — Progress Notes (Signed)
Summary: Wants Labs  Phone Note Call from Patient   Caller: Patient Call For: Loreen Freud DO Summary of Call: pt called and said that she got the RX filled and started taking it and still in alot of pain, wanted to know if you would  consider doing the lab work. c/b# [id number].... sign  Initial call taken by: Almeta Monas CMA Duncan Dull),  February 14, 2011 4:43 PM  Follow-up for Phone Call        719.49,  729.1   esr, cpk, ana, ra,  272.4 , 250.00  hgba1c, bmp, hep, lipid, microalbumin Follow-up by: Loreen Freud DO,  February 14, 2011 4:49 PM  Additional Follow-up for Phone Call Additional follow up Details #1::        pt aware of the above appt scheduled for Monday 02/17/11 Additional Follow-up by: Almeta Monas CMA Duncan Dull),  February 14, 2011 4:58 PM  New Problems: PAIN IN JOINT, MULTIPLE SITES (ICD-719.49) MYALGIA (ICD-729.1)   New Problems: PAIN IN JOINT, MULTIPLE SITES (ICD-719.49) MYALGIA (ICD-729.1)

## 2011-02-20 LAB — CONVERTED CEMR LAB
Anti Nuclear Antibody(ANA): NEGATIVE
Rhuematoid fact SerPl-aCnc: <10 intl units/mL (ref <=14)

## 2011-02-25 NOTE — Letter (Signed)
Summary: [facility]  [facility]   Imported By: Maryln Gottron 02/18/2011 10:52:45  _____________________________________________________________________  External Attachment:    Type:   Image     Comment:   External Document

## 2011-02-25 NOTE — Progress Notes (Signed)
Summary: urine cx  Phone Note Outgoing Call   Call placed by: Doristine Devoid CMA,  February 17, 2011 4:28 PM Call placed to: Patient Summary of Call: essentially negative----if still symptomatic----levaquin 250 1 by mouth once daily for 3 days and recheck 7-10 days ---UA and culture     New/Updated Medications: LEVAQUIN 250 MG TABS (LEVOFLOXACIN) take one tablet daily x3 days Prescriptions: LEVAQUIN 250 MG TABS (LEVOFLOXACIN) take one tablet daily x3 days  #3 x 0   Entered by:   Doristine Devoid CMA   Authorized by:   Loreen Freud DO   Signed by:   Doristine Devoid CMA on 02/17/2011   Method used:   Electronically to        [facility] [address] [id number]* (retail)       [address] [place], [place]  [address]       Ph: [phone]       Fax: [phone]   RxID:   [id number]

## 2011-02-25 NOTE — Letter (Signed)
Summary: [facility]  [facility]   Imported By: Maryln Gottron 02/18/2011 10:51:18  _____________________________________________________________________  External Attachment:    Type:   Image     Comment:   External Document

## 2011-02-27 ENCOUNTER — Telehealth: Payer: Self-pay | Admitting: Family Medicine

## 2011-03-03 ENCOUNTER — Telehealth (INDEPENDENT_AMBULATORY_CARE_PROVIDER_SITE_OTHER): Payer: Self-pay | Admitting: *Deleted

## 2011-03-03 ENCOUNTER — Telehealth: Payer: Self-pay | Admitting: Family Medicine

## 2011-03-04 ENCOUNTER — Telehealth: Payer: Self-pay | Admitting: Orthopedic Surgery

## 2011-03-06 ENCOUNTER — Encounter: Payer: Self-pay | Admitting: Gastroenterology

## 2011-03-06 DIAGNOSIS — E785 Hyperlipidemia, unspecified: Secondary | ICD-10-CM | POA: Insufficient documentation

## 2011-03-06 DIAGNOSIS — M8580 Other specified disorders of bone density and structure, unspecified site: Secondary | ICD-10-CM | POA: Insufficient documentation

## 2011-03-06 DIAGNOSIS — G629 Polyneuropathy, unspecified: Secondary | ICD-10-CM | POA: Insufficient documentation

## 2011-03-06 DIAGNOSIS — E119 Type 2 diabetes mellitus without complications: Secondary | ICD-10-CM | POA: Insufficient documentation

## 2011-03-06 NOTE — Progress Notes (Signed)
Summary: med concerns  Phone Note Refill Request Call back at [phone] ref#[id number] Message from:  [facility]  Refills Requested: Medication #1:  CRESTOR 5 MG TABS 1 by mouth at bedtime. Call from representative stating that Pt has history of a liver disorder several year ago and Pt should not take med. Pls advise if you would like for Pt to continue with med........Marland KitchenFelecia Deloach CMA  February 27, 2011 4:52 PM     Follow-up for Phone Call        rep from where?  who is this person ---she had a hepatic cyst.  Liver function is normal--- yes she needs the med Follow-up by: Loreen Freud DO,  February 27, 2011 5:10 PM  Additional Follow-up for Phone Call Additional follow up Details #1::        Discuss with [facility] pharmacy representative.Marland KitchenMarland KitchenMarland KitchenFelecia Deloach CMA  February 28, 2011 9:03 AM

## 2011-03-06 NOTE — Letter (Signed)
Summary: Records Dated 12-10 to 2-12/[facility]  Records Dated 12-10 to 2-12/[facility]   Imported By: Lanelle Bal 02/24/2011 13:17:43  _____________________________________________________________________  External Attachment:    Type:   Image     Comment:   External Document

## 2011-03-07 ENCOUNTER — Telehealth: Payer: Self-pay | Admitting: Gastroenterology

## 2011-03-11 NOTE — Progress Notes (Signed)
Summary: Medication  Medications Added BENTYL 10 MG CAPS (DICYCLOMINE HCL) 1 by mouth four times a day as needed       Phone Note Call from Patient Call back at Work Phone [phone]   Caller: Patient Call For: Dr. Arlyce Dice Reason for Call: Refill Medication Summary of Call: Needs a refill on her DICYCLOMINE....[facility] [address] sch'd for 04-17-11 Initial call taken by: Karna Christmas,  March 07, 2011 11:36 AM  Follow-up for Phone Call        called pt to inform medicaition sent Follow-up by: Merri Ray CMA Duncan Dull),  March 07, 2011 11:44 AM    New/Updated Medications: BENTYL 10 MG CAPS (DICYCLOMINE HCL) 1 by mouth four times a day as needed Prescriptions: BENTYL 10 MG CAPS (DICYCLOMINE HCL) 1 by mouth four times a day as needed  #60 x 2   Entered by:   Merri Ray CMA (AAMA)   Authorized by:   Louis Meckel MD   Signed by:   Merri Ray CMA (AAMA) on 03/07/2011   Method used:   Electronically to        [facility] [address] [id number]* (retail)       [address] [place], [place]  [address]       Ph: [phone]       Fax: [phone]   RxID:   [id number]

## 2011-03-11 NOTE — Progress Notes (Signed)
  Phone Note Other Incoming   Request: Send information Summary of Call: Request for records received from [employer]. Request forwarded to [employer].

## 2011-03-11 NOTE — Progress Notes (Signed)
Summary: refill  Phone Note Refill Request Message from:  Fax from Pharmacy on March 03, 2011 3:08 PM  Refills Requested: Medication #1:  HYDROCODONE-ACETAMINOPHEN 7.5-750 MG TABS 1 by mouth every 6 hours as needed [facility] - fax [phone]  Initial call taken by: Okey Regal [place],  March 03, 2011 3:08 PM  Follow-up for Phone Call        120 x0 given on 01/28/11. Please advise. Lucious Groves CMA  March 03, 2011 4:44 PM   Additional Follow-up for Phone Call Additional follow up Details #1::        refill x1 Additional Follow-up by: Loreen Freud DO,  March 03, 2011 5:26 PM    Prescriptions: HYDROCODONE-ACETAMINOPHEN 7.5-750 MG TABS (HYDROCODONE-ACETAMINOPHEN) 1 by mouth every 6 hours as needed  #120 x 0   Entered by:   Almeta Monas CMA (AAMA)   Authorized by:   Loreen Freud DO   Signed by:   Almeta Monas CMA (AAMA) on 03/04/2011   Method used:   Printed then faxed to ...       [facility] [address] (retail)       [address] [place], [place]  [address]       Ph: [phone]       Fax: [phone]   RxID:   [id number]

## 2011-03-12 ENCOUNTER — Telehealth (INDEPENDENT_AMBULATORY_CARE_PROVIDER_SITE_OTHER): Payer: Self-pay | Admitting: *Deleted

## 2011-03-12 LAB — POCT I-STAT, CHEM 8
Calcium, Ion: 1.19 mmol/L (ref 1.12–1.32)
Glucose, Bld: 102 mg/dL — ABNORMAL HIGH (ref 70–99)
HCT: 41 % (ref 36.0–46.0)
Hemoglobin: 13.9 g/dL (ref 12.0–15.0)
TCO2: 29 mmol/L (ref 0–100)

## 2011-03-12 LAB — GLUCOSE, CAPILLARY: Glucose-Capillary: 105 mg/dL — ABNORMAL HIGH (ref 70–99)

## 2011-03-18 NOTE — Progress Notes (Signed)
Summary: refill  Phone Note Refill Request Call back at Home Phone [phone] Message from:  Patient  Refills Requested: Medication #1:  JANUVIA 100 MG TABS 1 by mouth once daily [facility]........Marland KitchenFelecia Deloach CMA  March 12, 2011 4:05 PM      Prescriptions: JANUVIA 100 MG TABS (SITAGLIPTIN PHOSPHATE) 1 by mouth once daily  #90 x 3   Entered by:   Almeta Monas CMA (AAMA)   Authorized by:   Loreen Freud DO   Signed by:   Almeta Monas CMA (AAMA) on 03/12/2011   Method used:   Faxed to ...       [facility] (mail-order)             , [place]         Ph: [phone]       Fax: [phone]   RxID:   [id number]

## 2011-03-19 ENCOUNTER — Other Ambulatory Visit: Payer: Self-pay | Admitting: *Deleted

## 2011-03-19 LAB — POCT CARDIAC MARKERS
CKMB, poc: <1 ng/mL — ABNORMAL LOW (ref 1.0–8.0)
Troponin i, poc: <0.05 ng/mL (ref 0.00–0.09)

## 2011-03-19 LAB — CBC
HCT: 37.1 % (ref 36.0–46.0)
Hemoglobin: 12.2 g/dL (ref 12.0–15.0)
MCV: 86.2 fL (ref 78.0–100.0)
RDW: 16.2 % — ABNORMAL HIGH (ref 11.5–15.5)

## 2011-03-19 LAB — HEPATIC FUNCTION PANEL
ALT: 19 U/L (ref 0–35)
Alkaline Phosphatase: 56 U/L (ref 39–117)
Bilirubin, Direct: <0.1 mg/dL (ref 0.0–0.3)

## 2011-03-19 LAB — GLUCOSE, CAPILLARY
Glucose-Capillary: 104 mg/dL — ABNORMAL HIGH (ref 70–99)
Glucose-Capillary: 111 mg/dL — ABNORMAL HIGH (ref 70–99)
Glucose-Capillary: 94 mg/dL (ref 70–99)

## 2011-03-19 LAB — URINALYSIS, ROUTINE W REFLEX MICROSCOPIC
Bilirubin Urine: NEGATIVE
Hgb urine dipstick: NEGATIVE
Protein, ur: NEGATIVE mg/dL
Urobilinogen, UA: 0.2 mg/dL (ref 0.0–1.0)

## 2011-03-19 LAB — BASIC METABOLIC PANEL
CO2: 34 mEq/L — ABNORMAL HIGH (ref 19–32)
Chloride: 101 mEq/L (ref 96–112)
GFR calc non Af Amer: >60 mL/min (ref >60)
Glucose, Bld: 110 mg/dL — ABNORMAL HIGH (ref 70–99)
Potassium: 4 mEq/L (ref 3.5–5.1)
Sodium: 139 mEq/L (ref 135–145)

## 2011-03-19 LAB — CARDIAC PANEL(CRET KIN+CKTOT+MB+TROPI)
CK, MB: 0.9 ng/mL (ref 0.3–4.0)
CK, MB: 0.9 ng/mL (ref 0.3–4.0)

## 2011-03-19 LAB — DIFFERENTIAL
Basophils Absolute: 0 10*3/uL (ref 0.0–0.1)
Eosinophils Absolute: 0.1 10*3/uL (ref 0.0–0.7)
Eosinophils Relative: 2 % (ref 0–5)
Lymphocytes Relative: 38 % (ref 12–46)
Monocytes Absolute: 0.3 10*3/uL (ref 0.1–1.0)

## 2011-03-19 LAB — LIPID PANEL
LDL Cholesterol: 152 mg/dL — ABNORMAL HIGH (ref 0–99)
Total CHOL/HDL Ratio: 3.9 RATIO
Triglycerides: 104 mg/dL (ref <150)
VLDL: 21 mg/dL (ref 0–40)

## 2011-03-19 LAB — D-DIMER, QUANTITATIVE: D-Dimer, Quant: 0.32 ug/mL-FEU (ref 0.00–0.48)

## 2011-03-19 MED ORDER — SITAGLIPTIN PHOSPHATE 100 MG PO TABS: 100.0000 mg | ORAL_TABLET | Freq: Every day | ORAL | Status: DC

## 2011-03-19 NOTE — Telephone Encounter (Signed)
Pt notified via detail message.

## 2011-04-01 ENCOUNTER — Other Ambulatory Visit: Payer: Self-pay

## 2011-04-01 DIAGNOSIS — G8929 Other chronic pain: Secondary | ICD-10-CM

## 2011-04-01 NOTE — Telephone Encounter (Signed)
Last filled 03/04/11 and seen 02/12/11 please advise     KP

## 2011-04-02 LAB — GLUCOSE, CAPILLARY

## 2011-04-02 MED ORDER — HYDROCODONE-ACETAMINOPHEN 7.5-750 MG PO TABS: 1.0000 | ORAL_TABLET | Freq: Four times a day (QID) | ORAL | Status: DC | PRN

## 2011-04-02 NOTE — Telephone Encounter (Signed)
Ok to refill x 1  

## 2011-04-02 NOTE — Telephone Encounter (Signed)
Faxed.   KP 

## 2011-04-08 ENCOUNTER — Telehealth: Payer: Self-pay | Admitting: Family Medicine

## 2011-04-08 NOTE — Telephone Encounter (Signed)
Ok to refill the rest

## 2011-04-08 NOTE — Telephone Encounter (Signed)
Patient says she picked up her prescription for Hydrocodone at pharmacy and was only given 8 pills---why did she get a small amount??

## 2011-04-08 NOTE — Telephone Encounter (Signed)
Pt usually gets #120 but Rx was sent for #30 ok to fill for remaining tabs. [facility]. Please advise

## 2011-04-09 MED ORDER — HYDROCODONE-ACETAMINOPHEN 7.5-750 MG PO TABS: 1.0000 | ORAL_TABLET | Freq: Four times a day (QID) | ORAL | Status: DC | PRN

## 2011-04-09 NOTE — Telephone Encounter (Signed)
VM left making pt aware Rx sent to pharmacy    KP

## 2011-04-17 ENCOUNTER — Encounter: Payer: Self-pay | Admitting: Gastroenterology

## 2011-04-17 ENCOUNTER — Ambulatory Visit (INDEPENDENT_AMBULATORY_CARE_PROVIDER_SITE_OTHER): Payer: Medicare Other | Admitting: Gastroenterology

## 2011-04-17 DIAGNOSIS — R143 Flatulence: Secondary | ICD-10-CM

## 2011-04-17 DIAGNOSIS — R141 Gas pain: Secondary | ICD-10-CM

## 2011-04-17 DIAGNOSIS — R109 Unspecified abdominal pain: Secondary | ICD-10-CM

## 2011-04-17 NOTE — Assessment & Plan Note (Addendum)
Symptoms are nonspecific and may be related to nonulcer dyspepsia. Prior gastric emptying scan was negative for gastroparesis, although I have some suspicion that she has mild gastric delay in view of her persistent nausea.  Recommendations #1 one-week trial of Reglan. The patient was carefully instructed to call back in one week to report her progress.  Patient was instructed to contact me immediately  if she develops any side effects from her Reglan including paresthesias, tremors, confusion , weakness or muscle spasms.

## 2011-04-17 NOTE — Progress Notes (Signed)
Stacey Leon continues to complain of intermittent low-grade diffuse abdominal discomfort. It is responsive to dicyclomine. She also complains of bloating and mild nausea. Prior gastric emptying scan was normal. Endoscopy and colonoscopy in the last 2 years was unrevealing for source of pain.

## 2011-04-17 NOTE — Assessment & Plan Note (Signed)
She is very nonspecific symptoms that respond to anticholinergics. She will continue continue with Bentyl.

## 2011-04-17 NOTE — Patient Instructions (Addendum)
Patient was instructed to contact me immediately  if she develops any side effects from her Reglan including paresthesias, tremors, confusion , weakness or muscle spasms. Please contact the office in 1 week to let us know how you are doing We are sending in a prescription to your pharmacy

## 2011-04-23 ENCOUNTER — Telehealth: Payer: Self-pay | Admitting: Gastroenterology

## 2011-04-23 MED ORDER — METOCLOPRAMIDE HCL 5 MG PO TABS: 5.0000 mg | ORAL_TABLET | Freq: Four times a day (QID) | ORAL | Status: AC

## 2011-04-23 NOTE — Telephone Encounter (Signed)
RESENT IN RX TO [facility], TRIED TO CONTACT PT.the patient HAS [employer]ON[employer] WILL NOT ACCEPT OUR CALLS   ASKED AMY HOW TO PRESCRIBE REGLAN 5MG  FOUR TIMES A DAY 30 MINUTES BEFORE MEALS

## 2011-04-30 ENCOUNTER — Other Ambulatory Visit: Payer: Self-pay

## 2011-04-30 ENCOUNTER — Telehealth: Payer: Self-pay | Admitting: Family Medicine

## 2011-04-30 MED ORDER — HYDROCODONE-ACETAMINOPHEN 7.5-750 MG PO TABS: 1.0000 | ORAL_TABLET | Freq: Four times a day (QID) | ORAL | Status: DC | PRN

## 2011-04-30 NOTE — Telephone Encounter (Signed)
Last seen 02/12/11 and filled 04/09/11 please advise      KP

## 2011-04-30 NOTE — Telephone Encounter (Signed)
Faxed.   KP 

## 2011-04-30 NOTE — Telephone Encounter (Signed)
Patient called to report that her prescription for Hydrocodone was called into [facility] [address] and [place], [place]

## 2011-04-30 NOTE — Telephone Encounter (Signed)
Called [facility] and cancelled Rx---[facility]ed  [facility] [address].       KP

## 2011-05-02 ENCOUNTER — Other Ambulatory Visit: Payer: Self-pay | Admitting: Gastroenterology

## 2011-05-10 ENCOUNTER — Encounter: Payer: Self-pay | Admitting: Family Medicine

## 2011-05-12 ENCOUNTER — Encounter: Payer: Self-pay | Admitting: Family Medicine

## 2011-05-12 ENCOUNTER — Ambulatory Visit (INDEPENDENT_AMBULATORY_CARE_PROVIDER_SITE_OTHER): Payer: Medicare Other | Admitting: Family Medicine

## 2011-05-12 VITALS — BP 130/74 | HR 98 | Temp 98.9°F | Resp 16 | Wt 204.4 lb

## 2011-05-12 DIAGNOSIS — E785 Hyperlipidemia, unspecified: Secondary | ICD-10-CM

## 2011-05-12 DIAGNOSIS — M25559 Pain in unspecified hip: Secondary | ICD-10-CM

## 2011-05-12 DIAGNOSIS — M25552 Pain in left hip: Secondary | ICD-10-CM | POA: Insufficient documentation

## 2011-05-12 DIAGNOSIS — E119 Type 2 diabetes mellitus without complications: Secondary | ICD-10-CM

## 2011-05-12 DIAGNOSIS — G8929 Other chronic pain: Secondary | ICD-10-CM

## 2011-05-12 DIAGNOSIS — R700 Elevated erythrocyte sedimentation rate: Secondary | ICD-10-CM

## 2011-05-12 DIAGNOSIS — D649 Anemia, unspecified: Secondary | ICD-10-CM

## 2011-05-12 DIAGNOSIS — M549 Dorsalgia, unspecified: Secondary | ICD-10-CM

## 2011-05-12 MED ORDER — HYDROCODONE-ACETAMINOPHEN 7.5-750 MG PO TABS: 1.0000 | ORAL_TABLET | Freq: Four times a day (QID) | ORAL | Status: DC | PRN

## 2011-05-12 NOTE — Progress Notes (Signed)
  Subjective:    Patient ID: Stacey Leon, female    DOB: September 01, 1941, 70 y.o.   MRN: [id number]  HPI Pt here because she was given the wrong number of pills.  She also states her L hip pain is back.  She was given an injection by [employer] or maybe Dr Ethelene Hal and the pain was gone for several years.  No other complaints.   Review of Systems    as above Objective:   Physical Exam  Constitutional: She appears well-developed and well-nourished.  Musculoskeletal: Normal range of motion. She exhibits tenderness.       L hip pain  Psychiatric: She has a normal mood and affect. Her behavior is normal. Judgment and thought content normal.          Assessment & Plan:

## 2011-05-12 NOTE — Assessment & Plan Note (Signed)
Refer to ortho Refill pain meds 

## 2011-05-12 NOTE — Patient Instructions (Signed)
Hip Pain The hips join the upper legs to the lower pelvis. The bones, cartilage, tendons, and muscles of the hip joint perform a lot of work each day holding your body weight and allowing you to move around. Hip pain is a common symptom. It can range from a minor ache to severe pain on 1 or both hips. Pain may be felt on the inside of the hip joint near the groin, or the outside near the buttocks and upper thigh. There may be swelling or stiffness as well. It occurs more often when a person walks or performs activity. There are many reasons hip pain can develop. CAUSES It is important to work with your caregiver to identify the cause since many conditions can impact the bones, cartilage, muscles, and tendons of the hips. Causes for hip pain include:  Broken (fractured) bones.   Separation of the thighbone from the hip socket (dislocation).   Torn cartilage of the hip joint.   Swelling (inflammation) of a tendon (tendonitis), the sac within the hip joint (bursitis), or a joint.   A weakening in the abdominal wall (hernia), affecting the nerves to the hip.   Arthritis in the hip joint or lining of the hip joint.   Pinched nerves in the back, hip, or upper thigh.   A bulging disc in the spine (herniated disc).   Rarely, bone infection or cancer.  DIAGNOSIS The location of your hip pain will help your caregiver understand what may be causing the pain. A diagnosis is based on your medical history, your symptoms, results from your physical exam, and results from diagnostic tests. Diagnostic tests may include X-ray exams, a computerized magnetic scan (magnetic resonance imaging, MRI), or bone scan. TREATMENT Treatment will depend on the cause of your hip pain. Treatment may include:  Limiting activities and resting until symptoms improve.   Crutches or other walking supports (a cane or brace).   Ice, elevation, and compression.   Physical therapy or home exercises.   Shoe inserts or  special shoes.   Losing weight.   Medications to reduce pain.   Undergoing surgery.  HOME CARE INSTRUCTIONS  Only take over-the-counter or prescription medicines for pain, discomfort, or fever as directed by your caregiver.   Put ice on the injured area:   Put ice in a plastic bag.   Place a towel between your skin and the bag.   Leave the ice on for 15 minutes at a time, 2-3 times a day.   Keep your leg raised (elevated) when possible to lessen swelling.   Avoid activities that cause pain.   Follow specific exercises as directed by your caregiver.   Sleep with a pillow between your legs on your most comfortable side.   Record how often you have hip pain, the location of the pain, and what it feels like. This information may be helpful to you and your caregiver.   Ask your caregiver about returning to work or sports and whether you should drive.   Follow up with your caregiver for further exams, therapy, or testing as directed.  SEEK MEDICAL CARE IF:  Pain or swelling continues or worsens beyond 1 week.   You have an oral temperature above 100.4.   You are feeling unwell or have chills.   You are having an increasingly difficult time with walking.   You have a loss of sensation or other new symptoms.   You have questions or concerns.  SEEK IMMEDIATE MEDICAL CARE IF:  You cannot put weight on the affected hip.   You have fallen.   You have a sudden increase in pain and swelling in your hip.   You have an oral temperature above 100.4, not controlled by medicine.  MAKE SURE YOU:  Understand these instructions.   Will watch your condition.   Will get help right away if you are not doing well or get worse.  Document Released: 06/04/2010  [facility] Patient Information 2011 [place], [place].

## 2011-05-13 NOTE — Assessment & Plan Note (Signed)
Stacey Leon                         GASTROENTEROLOGY OFFICE NOTE   [name], [name]                       MRN:          [id number]  DATE:11/02/2007                            DOB:          1941/02/11    PROBLEM:  Abdominal pain.   [name] has returned for evaluation of abdominal pain.  She has  been suffering from severe lower back pain that radiates to her right  lower extremity.  This has been a problem for several years, for which  she takes oxycodone.  She is complaining of pain in the right lower  quadrant, just above the groin.  It is unrelated to bowel movements.  It  is worse when she walks about.  Workup has included urinalysis,  transvaginal ultrasound and pelvic CT, all of which were not diagnostic.  A recent pelvic exam apparently was normal.  Pain is intermittent.  She  moves her bowels regularly.  Two years ago, a colonoscopy was entirely  normal.   EXAM:  Pulse 104, blood pressure 144/78, weight 206.  HEENT: EOMI.  PERRLA.  Sclerae are anicteric.  Conjunctivae are pink.  NECK:  Supple without thyromegaly, adenopathy or carotid bruits.  CHEST:  Clear to auscultation and percussion without adventitious  sounds.  CARDIAC:  Regular rhythm; normal S1 S2.  There are no murmurs, gallops  or rubs.  ABDOMEN:  Bowel sounds are normoactive.  Abdomen is soft, nontender and  nondistended.  There are no abdominal masses, tenderness, splenic  enlargement or hepatomegaly.  EXTREMITIES:  Full range of motion.  No cyanosis, clubbing or edema.  RECTAL:  Deferred.   IMPRESSION:  Right lower quadrant pain.  I am not convinced that the  pain is GI in origin.  It is possible that this could be nerve root pain  from lumbosacral spine disease.  There is no evidence for a UTI or  nephrolithiasis or GYN pathology.   RECOMMENDATION:  Trial of hyoscyomine 0.375 mg twice a day.  I carefully  instructed [name] to call me in approximately five  days to report  her progress.     Barbette Hair. Arlyce Dice, MD,FACG  Electronically Signed    RDK/MedQ  DD: 11/02/2007  DT: 11/03/2007  Job #: [id number]   cc:   Lelon Perla, DO

## 2011-05-16 ENCOUNTER — Telehealth: Payer: Self-pay | Admitting: *Deleted

## 2011-05-16 DIAGNOSIS — M2550 Pain in unspecified joint: Secondary | ICD-10-CM

## 2011-05-16 NOTE — Consult Note (Signed)
Stacey Leon, HANOVER                ACCOUNT NO.:  [id number]   MEDICAL RECORD NO.:  [id number]          PATIENT TYPE:  REC   LOCATION:  FOOT                         FACILITY:  [facility]   PHYSICIAN:  Jonelle Sports. Cheryll Cockayne, M.D. DATE OF BIRTH:  12-13-1941   DATE OF CONSULTATION:  02/13/2005  DATE OF DISCHARGE:                                   CONSULTATION   REFERRING PHYSICIAN:  Angelena Sole, M.D.   HISTORY:  This is a 70 year old black female is referred courtesy of Dr.  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   The the patient has type 2 diabetes, apparently without much complication  and with recent hemoglobin A1C of 6.4%. She in the past had a traumatic  wound to the plantar aspect of the left foot when she stepped on a tack but  that healed relatively quickly. Otherwise she has had no notable difficulty  with her feet.   With that background history, in early November 2005 the patient dropped a  book which struck her pretibial area of the left lower extremity in its  distal third and created three small open wounds there. This had progressed  into a frank chronic ulceration over time and has been treated with  application at one point of Silvadene cream and later with Lotrisone cream  and recently with a course of Keflex which continues at present, none of  which have brought relief. She has on her own been wrapping that extremity  in an Ace bandage.   Because this lack of healing, she is here today for our evaluation and  advice.   PAST MEDICAL HISTORY:  Notable for hypertension and hyperlipidemia in  addition to her diabetes.   REGULAR MEDICATIONS:  1.  Keflex, which is to continue for an additional 3 days.  2.  Vicodin as needed for pain.  3.  Torsemide.  4.  Aciphex.  5.  Quinapril.  6.  Estradiol.  7.  Actos.  8.  Singulair.  9.  Zocor.  10. Multiple vitamin.  11. Baby aspirin.  12. Norvasc.  13. Several  p.r.n.'s.   EXAMINATION:  Examination today is limited to the distal lower extremities.  The extremities are overall quite healthy in appearance. They are free of  deformity or edema. Skin temperatures are in the low normal range but quite  symmetrical.  Pulses are everywhere palpable and quite generous monofilament  testing shows that she has protective sensation throughout both feet. She  has some minor shoe pressure marks on the dorsal aspect of her right fifth  toe and her left fourth toe and on minimal callus formation at the  interphalangeal joints of the halluces bilaterally. None of these need  immediate attention.   On the distal medial pretibial area of the left lower extremity and its  distal third are noted to tiny open ulcerations with fibrinous bases, these  measuring that most 2 x 2 mm and then a principal ulcer which is 11 x 9 mm,  3 mm in depth with a tightly adherent  fibrinous base and with rolled edges.   DISPOSITION:  1.  It is discussed with the patient that the wound cannot heel until it is      more adequately debrided of this fibrinous exudate and also that the      rolled skin edges probably will need to be sharply reduced. Because she      has a great deal of pain in the area, we will initiate therapy with a      debriding chemical agent and then later perhaps under coverage of EMLA      or local lidocaine due to the debride of the margins the ulcer.   It is certainly the assessment of this examiner that  this wound should heal  pretty satisfactorily once it is cleaned up and we can stimulate  granulation.  1.  Accordingly the wound is dressed with an application of Panafil ointment      and that extremity is placed in a Profore wrap. This will be changed      with reapplication of the Panafil four days hence and eight days hence      and the patient will be seen again in 13 days for reevaluation and      probable more adequate debridement that time.   She  is instructed to continue her additional three days of Keflex at this  time and that we will advisor her if additional antibiotics are needed.      RES/MEDQ  D:  02/13/2005  T:  02/13/2005  Job:  [id number]   cc:   Angelena Sole, M.D. [facility]

## 2011-05-16 NOTE — Assessment & Plan Note (Signed)
Ms. Alas is back in for a recheck today.   PROBLEM LIST:  1. Hypertension.  2. Non-insulin-dependent diabetes mellitus.  3. Cholesterolemia.   ACTIVE PROBLEMS BEING TREATED CURRENTLY AT [facility]:  Coccydynia and intermittent left leg pain.   CURRENT MEDICATIONS:  1. Zocor 40 mg daily.  2. Estrace one mg daily.  3. Accupril 40 mg daily.  4. Maxzide daily.  5. Norvasc 5 mg daily.  6. Actos 30 mg daily.  7. Triamterene/hydrochlorothiazide 37.5/25.  8. Nasonex.  9. Prevacid 30 mg daily.   ALLERGIES:  PENICILLIN.   The patient reports that she continues to have intermittent pain into the  coccyx area that goes as high as a 9/10 on a pain scale and often it can be  down to a level two on a pain scale.  Her average daily pain is about a  five.  Current pain in the clinic right now is about a seven.  She reports  she gets about 50% relief with medications.  The pain will interfere with  her mood and ability to do some activities of daily living.  Overall she is  staying rather functional.   PHYSICAL EXAMINATION:  GENERAL:  She is in no apparent distress today.  She  is pleasant, alert and cooperative.  VITAL SIGNS:  Blood pressure is 113/49, pulse 86, 96% saturation on room  air.  NEUROLOGIC:  She is able to sit during our interview.  She is able to rise  out of the chair easily.  Gait is normal.  Heel-toe walking is normal.  Tandem gait is normal.  Romberg is negative.  Seated, reflexes are 1+ in the  lower extremities and symmetric.  Toes are down going, no clonus is noted.  Straight leg raise is negative. No obvious differences in sensation today.  Motor strength is 5/5 in the lower extremities.   Coccyx was palpated, there was minimal tenderness in the area.   DIAGNOSIS:  Coccydynia.   CBC and bone scan were reviewed with the patient.  The bone scan showed some  punctate increased activity in the mandible most likely due to  dental  disease and an area of increased uptake in the right first carpometacarpal  joint.   These results were reviewed with the patient.   PLAN:  Will start her on Ibuprofen 400 mg one p.o. q.8h.  Will see her back  in four weeks.      Brantley Stage, M.D.   DMK/MedQ  D:  01/03/2004 17:51:19  T:  01/04/2004 16:10:96  Job #:  [id number]

## 2011-05-16 NOTE — Group Therapy Note (Signed)
REASON FOR CONSULTATION:  Lumbar degenerative disk disease.   CHIEF COMPLAINT:  I can't sit down for long because my tail bone area  hurts.   HISTORY OF PRESENT ILLNESS:  Ms. Stacey Leon is a 70 year old black female who  presents with a greater than 30 year history of perineal and pelvic type  pain. She has had multiple surgeries in the perineal area, two for  Bartholin's cyst and 5 perineal surgeries to repair post birth trauma. She  recalls that this area has always been a problem for her, however, in the  last couple of years, it seems to have gotten worse and worse for her. She  describes the pain in the tail bone area as a nagging ache and it can get  sharp and stabbing. Her average pain is approximately a 9 on a scale of 10  and it really does not get much lower than that. The least pain she gets is  about an 8 on a scale of 10. She denies any weakness. No numbness or  tingling. She denies any bowel or bladder control issues. She does not have  any gait problems. The pain is constant but it does wax and wane in  intensity and she has it pretty much every day. Things that exacerbate this  pain include sitting and walking. She has improved with some medications in  the past. She has not had any kind of physical therapy. She has had a recent  MRI. There is one dating July 05, 2003, which showed stenosis at L4-5 with a  small disk at L4-5 and degenerative disk disease at L5-S1. She also had a  recent MRI of her coccyx area, which showed a small amount of fluid/edema  along the tip of the coccyx without definite bone abnormality. The rest of  the coccyx and sacrum was unremarkable and that MRI was done on October 10, 2003.   FUNCTION:  The patient reports that she does have quite a bit of trouble  sitting for any length of time. She has to sit either on one side or the  other and during the history of her illness, she did have to stand up during  part of the interview because she was  uncomfortable sitting. She is able to  walk about 20 minutes. She is able to do all of her activities of daily  living but she does have definitely difficulty sitting for any period of  time. She reports this typically does not keep her up at night but there is  at least 1 or 2 night a week that she does have trouble sleeping because of  it. Denies any bowel control problems. Her mood is fairly good.   ALLERGIES:  PENICILLIN.   CURRENT MEDICATIONS:  Include Accupril, Actos, Maxzide, Norvasc, Esterase,  Zocor and Flonase.   PAST MEDICAL HISTORY:  Remarkable for diabetes x4 years and hypertension.   PAST SURGICAL HISTORY:  Remarkable for a hysterectomy and right ovary  removal. Her last pap smear was December 01, 2003 and apparently negative.   REVIEW OF SYSTEMS:  Unremarkable for any kind of fevers, chills, stomach  problems, cancers, kidney problems, bleeding disorders, asthma, or  respiratory problems. Denies any heart problems.   FAMILY HISTORY:  Remarkable for a father who had lung cancer. Brother with  multiple myeloma. There is also hypertension and diabetes and heart disease  in the family.   SOCIAL HISTORY:  She is divorced. She lives alone. She does not drink  alcohol. Rarely drinks caffeine. Does not smoke. Does not use illicit drugs.  She is retired. She used to work as an occupational therapy for many years.   PHYSICAL EXAMINATION:  She was cooperative and affect was appropriate. She  had difficulty sitting throughout the interview. She was able to walk into  the room without any difficulty. She walked on her heels and toes without  any difficulty. She had just mildly limited forward flexion of her lumbar  spine and slightly limited extension as well as lateral flexion. Overall,  her movement was fairly good and fluent. There were now pain behaviors  during this. When she was seated, her reflexes were 2+ in the upper  extremities and symmetric. They were 1+ in the lower  extremities and  symmetric. Toes were downgoing. No clonus is noted. Straight leg raise is  negative. Sensation was decreased over the medial aspect of her right leg,  L4 distribution, and it was also decreased over the lateral aspect of her  left leg in L5 distribution. Motor strength in the upper and lower  extremities was in 5 over 5 range for all major muscle groups tests  including shoulder abductors, biceps, triceps, brachial radialis, finger  flexors and intrinsics. Lower extremity muscles were 5 over 5 including hip  flexors, knee extensors, dorsi flexors, plantar flexors, and EHL. Adductors  and abductors of the hip were also examined and were 5 over 5 strength.  While she was lying on her side, I palpated her lumbar area. She did not  have any obvious tenderness to palpation and I also palpated her coccygeal  area and she denied any tenderness with palpation of this area as well.   DIAGNOSIS:  Coccydynia.   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 We will go ahead and have her  either try to find this report or the scan and bring it back to the visit  next time. We will follow her up in 4 weeks and at this time, will review  the bone scan, her CBC, as well as the MRI and may consider starting her on  some medication at that point or further workup pending what the results of  the study show.     Stacey Leon, M.D.   DMK/MedQ  D:  11/29/2003 17:07:12  T:  11/29/2003 04:54:09  Job #:  [id number]   cc:   Jene Every, M.D.  [address]  [place]  [place] [address]  Fax: [phone]

## 2011-05-16 NOTE — Assessment & Plan Note (Signed)
CHIEF COMPLAINT:  Tailbone pain.   HISTORY OF PRESENT ILLNESS:  Stacey Leon has an approximately over 10 year  history of low back and pain in her coccyx area.  She is started on  ibuprofen on January 03, 2004.  She reports that the ibuprofen made her very  sleepy and she stopped taking it.  It still hurts her tailbone area to sit  for a prolonged period of time.  She started taking BC's and they seemed to  help somewhat.  She has numbness and tingling in her feet, possibly  secondary to diabetes.  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  She also complains of back pain.  Reports it at an 8 on a scale of 10 and  some leg pain, going down her left leg which is an 8 on a scale of 10.  Her  pain improves with rest.  Denies any bowel or bladder control problems.  Denies any weakness.   CURRENT MEDICATIONS:  1. Zocor 40 mg q.d.  2. Triamterene 37.5/25 mg q.d.  3. Norvasc 5 mg q.d.  4. Accupril 40 mg q.d.  5. Actos 30 mg q.d.  6. Estradiol 1 mg q.d.  7. Prevacid 30 mg q.d.  8. Nasonex b.i.d.  9. BC powder.  10.      Femring 1 every three months, unless she has an allergy to     ampicillin.   PHYSICAL EXAMINATION:  GENERAL:  She appeared comfortable during our  interview, was cooperative, and well-groomed.  VITAL SIGNS:  She has a blood pressure of 126/60, pulse 105.  Saturation 98%  on room air.  NEUROLOGICAL:  She was able to walk in the room without any difficulty.  She  had some difficulty rising up on her toes bilaterally.  Otherwise, her motor  strength in the lower extremities including hip flexors, knee extensors,  dorsiflexion, EHL knee maneuvers were all in the 5/5 range. She has  decreased sensation in her below the knee bilaterally.  Her reflexes are 1+  at the knees, 1+ at the Achilles tendon, and toes are downgoing.  There is  no clonus.  Again, her coccyx was palpated.  She really does not have any  pain again with palpation in this area.  She points to an area  in the middle  of her sacrum as the area which she refers to as her tailbone.  So, her  central sacral area is what is tender on her.   IMPRESSION:  1. Pain in the area of her sacrum.  2. Left lower extremity pain, rule out radiculopathy.  3. Chronic low back pain.   PLAN:  Darvocet-N 100 1 p.o. t.i.d. 60 units.  MRI of the sacrum and lumbar  spine in a patient who has a history of some sort of cyst in her sacral  area.  X-ray of the lumbar spine, AP and lateral, and x-ray of the sacrum.  We will follow her back up in a month.  We will also give her some samples  of Lidoderm.  On exam, she was noted to have decreased sensation in the left  S1 dermatome and bilateral calf weakness.      Brantley Stage, M.D.   DMK/MedQ  D:  01/31/2004 17:29:50  T:  01/31/2004 19:56:05  Job #:  [id number]

## 2011-05-16 NOTE — Assessment & Plan Note (Signed)
[facility]                                 ON-CALL NOTE   Stacey Leon, Stacey Leon                         MRN:          [id number]  DATE:01/03/2008                            DOB:          09-19-1941    PATIENT:  Stacey Leon [id number].   DATE OF BIRTH:  03/29/1941.   TELEPHONE CALL:  The patient calling because she has a viral-X syndrome.  Dr. Lelon Perla was supposed to call her in some medicines.  I  advised symptomatic therapy for tonight and to see Dr. Laury Axon tomorrow.     Jeffrey A. Tawanna Cooler, MD  Electronically Signed    JAT/MedQ  DD: 01/03/2008  DT: 01/04/2008  Job #: [id number]

## 2011-05-16 NOTE — Assessment & Plan Note (Signed)
HISTORY:  The patient is back in for a recheck today.  She is a 70 year old  woman with a 10-year-history of low back and pain in her coccyx area.  She  reports that she had various pains in her parascapular shoulder area on the  left, as well as low back and sacral pain, and some discomfort down the left  leg.  She reports that the pain that she has described in the pain diagram  are not there all the time.  They come and go, depending upon her activity  level.  Typically she reports her pain to be an 8 on a scale of 10 on  average, and her pain does go down to a 6.  At the worst it is a 9.   She has been treated with multiple medications, including Naproxen,  Celebrex, Vioxx, Bextra, ibuprofen, Tylenol and Darvocet.  She reports that  she has an intolerance to all of these medications.  Some make her sleepy,  and some she finds just disrupt her sleep too dramatically to take  regularly.  She has had multiple scans of her lumbar and sacral area.  Recently repeated  these scans on March 13, 2004.  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  The sacrum and coccyx, as well as the lumbar spine  films showed that there is some stress sclerosis along the SI joint, as well  as loss of disk space at L5-S1, and to a lesser degree at L4-L5.  The  results of these three recent studies were reviewed with her, and the July 16, 2003, MRI was also discussed at this visit as well.   She continues to stay quite functional.  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  She reports that she does have some functional  impatience at various intervals, related to her shoulder and back and sacral  area at variable times.  She had no persistent numbness, tingling, or  weakness.  She has no bowel or bladder control problems.  Overall, her  function is quite good.   PHYSICAL EXAMINATION:  GENERAL:   She is alert and oriented, cooperative and  appropriate.  VITAL SIGNS:  Blood pressure 134/61, pulse 89, respirations 16, 98%  saturation on room air.  MUSCULOSKELETAL/NEUROLOGICAL:  She appears comfortable.  She gets out of the  chair and appears a little bit stiff getting up.  Her gait in the room is  otherwise normal, not antalgic.  She has some minimal limitations in her  lumbar range of motion.  Seated her reflexes are 1+ at the knees, 0 at the  ankles.  Toes are downgoing.  No clonus is noted.  Sensation is intact.  Motor strength in the lower extremities is 5/5 at the hip flexors, knee  extensors, dorsiflexors, plantar flexors and EHL.  No sensory deficits to  light touch.  Fabere's test is positive in the sacroiliac area on the left.  She has exquisite tenderness at both trochanters.  HEART:  A regular rhythm.  LUNGS:  Clear.  ABDOMEN:  Benign.   IMPRESSION:  1. Mild lumbar spondylosis with some noted facet hypertrophy and bilateral     lateral recess stenosis noted at L4-5, as well as some facet hypertrophy     noted at L2-3 and L3-4.  She has some degenerative disk changes at L4-5  and L5-S1.  2. Sacroiliac joint pain with a history of some stress sclerosis noted on     bilateral sacroiliac joints.  3. The patient gives some indication of some intermittent nerve root     irritation as well.   PLAN:  We discussed the patient's pain generators at length, as well as the  Tarlov cysts.  She will bring in some of her films from 1998.  I will review  them with the radiologist, to note if there has been any change in the  Tarlov cysts.  We discussed treatment options, which might include some  invasive diagnostic procedures.  At this point she is not at all interested  in pursuing this line of workup at this time.  She tells me she has quite a  few medication intolerances, and is wondering if there is anything that she  might be able to take.  At this point she is not terribly  functionally  limited at this time.  We agree that Lidoderm might be a good option for  her, and we decided to give her some samples today.  If she does well on  them, she can call us and we will call in a prescription for a box or two  for her for the next month.   I will otherwise see her back in one month.  Quite a bit of time was spent  discussing her treatment options at this visit, at least 30-40 minutes.  I  will see her back in one month.      Brantley Stage, M.D.   DMK/MedQ  D:  04/05/2004 17:29:28  T:  04/06/2004 12:31:02  Job #:  [id number]

## 2011-05-16 NOTE — Assessment & Plan Note (Signed)
REFERRING PHYSICIAN:  Jene Every, M.D.   Ms. Rayborn is a 70 year old woman who has a 10-year history of low back  pain and also history of coccydynia.  This is the fifth time she is being  seen in our pain and rehabilitative clinic.  She has undergone a variety of  diagnostic tests including bone scan, MRI of her pelvis and radiographs of  her pelvis.  She had lumbar MRI scans previously from July 2004, and an  older scan from 1988.   She reports her pain to be mainly in the low back and buttock area as well  as in the area of the coccyx.  It also radiates down the left leg all the  way to the foot.  Her average pain is about a 7 on a scale of 10, worst goes  up to an 8 and the least is about a 6 in the last 24 hours.  Her functional  status is not limited significantly.  She does have intermittent flare-ups  in the leg or the low back or in the coccyx area depending upon her activity  level or for sometimes no apparent reason.  Typically is worse in the  morning.   She has no problems with balance problems, control of bowel or bladder or  lower extremity weakness or upper extremity weakness or gait problems.   She treats her pain with BC powders.  She usually takes them in the morning  and finds it helpful.  She has been on Darvocet in the past that was  prescribed in February, as well as ibuprofen.  She did not find them  particularly helpful.   She tells me she is not particularly interested in any kind of invasive  therapeutic measures in terms of surgical or interventional with injections.   PHYSICAL EXAMINATION:  She has a blood pressure of 134/63, pulse 86,  respiratory rate 14, 98% saturated on room air.   She is alert, oriented, pleasant.  She appears comfortable as I interview  her and she sits on the examination table.  She is able to easily get off  the examination table.  Her gait in the room is normal.  She is able to rise  up on her toes.  She is able to forward  flex from her lumbar spine to  approximately 90 degrees.  She is able to extend back about 10 degrees and  lateral flexion is approximately 15 degrees bilaterally.  She removes her  socks.  She tells me she had a shoe box fall on her left dorsum of her foot  and approximately three weeks ago, and it still appears the dorsum is  somewhat swollen and somewhat ecchymotic into the couple of the toes and  lateral aspect of her foot.  She has some tenderness with palpation on the  dorsum of the foot.   Her sensory examination revealed decreased sensation in the left L5  dermatome.  No weakness at the left EHL, however.  She has full strength in  both of her legs, at hip flexors, knee extensors, dorsiflexors, plantar  flexors as well as EHL, 5/5 bilaterally.  No other sensory deficits are  noted to light touch or pin prick.  Her reflexes are 1+ at the knees, 0 at  the ankles and toes are downgoing.  No clonus is noted.  Her balance is  good.  Gait is normal.   IMPRESSION:  1. Intermittent coccydynia.  MRI done October 10, 2003, revealed small  amount of edema/fluid around the coccyx.  2. 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  3. L4-5 facet arthropathy with effusion per MRI scan done in July of 2004.  4. Degenerative disc disease.  5. Mild left S1 radiculopathy.  6. Moderate central and lateral recess stenosis at L4-5.  7. Variable sacroiliac joint pain with stress sclerosis noted on     radiographs.  8. Trauma to dorsum of left foot.  The patient will follow up with primary     care physician for x-rays if this does not improve over the next week.   PLAN:  Will give the patient some samples of Ultracet one to two p.o. b.i.d.  p.r.n. and will give her samples of Lidoderm.   She will call in if she finds either of these medications beneficial.  She  will continue to take her BC powders on a p.r.n. basis as well.  Will follow  her back up on a p.r.n. basis.  She  will give [place] a call if she has a flare-  up.      Brantley Stage, M.D.   DMK/MedQ  D:  05/15/2004 16:24:33  T:  05/15/2004 17:57:50  Job #:  [id number]   cc:   Jene Every, M.D.  [address]  [place]  [place] [address]  Fax: [phone]

## 2011-05-16 NOTE — Telephone Encounter (Signed)
Pt is requesting to be referred to a new orthopedic that she has not seen previously..Please advise

## 2011-05-16 NOTE — Telephone Encounter (Signed)
Ok to send her somewhere else

## 2011-05-19 ENCOUNTER — Other Ambulatory Visit: Payer: Self-pay | Admitting: *Deleted

## 2011-05-19 DIAGNOSIS — Z0000 Encounter for general adult medical examination without abnormal findings: Secondary | ICD-10-CM

## 2011-05-19 NOTE — Telephone Encounter (Signed)
Pt aware referral put in awaiting appt info. 

## 2011-05-20 ENCOUNTER — Other Ambulatory Visit (INDEPENDENT_AMBULATORY_CARE_PROVIDER_SITE_OTHER): Payer: Medicare Other

## 2011-05-20 DIAGNOSIS — E119 Type 2 diabetes mellitus without complications: Secondary | ICD-10-CM

## 2011-05-20 DIAGNOSIS — E785 Hyperlipidemia, unspecified: Secondary | ICD-10-CM

## 2011-05-20 DIAGNOSIS — Z0000 Encounter for general adult medical examination without abnormal findings: Secondary | ICD-10-CM

## 2011-05-20 LAB — BASIC METABOLIC PANEL
CO2: 32 mEq/L (ref 19–32)
Calcium: 9.9 mg/dL (ref 8.4–10.5)
Creatinine, Ser: 0.7 mg/dL (ref 0.4–1.2)
GFR: 102.13 mL/min (ref >60.00)
Sodium: 139 mEq/L (ref 135–145)

## 2011-05-20 LAB — POCT URINALYSIS DIPSTICK
Bilirubin, UA: NEGATIVE
Blood, UA: NEGATIVE
Glucose, UA: NEGATIVE
Spec Grav, UA: 1.005

## 2011-05-20 LAB — HEPATIC FUNCTION PANEL
Alkaline Phosphatase: 61 U/L (ref 39–117)
Bilirubin, Direct: 0 mg/dL (ref 0.0–0.3)
Total Protein: 6.4 g/dL (ref 6.0–8.3)

## 2011-05-20 LAB — CBC WITH DIFFERENTIAL/PLATELET
Basophils Absolute: 0 10*3/uL (ref 0.0–0.1)
Eosinophils Absolute: 0.3 10*3/uL (ref 0.0–0.7)
Eosinophils Relative: 4.6 % (ref 0.0–5.0)
HCT: 39.4 % (ref 36.0–46.0)
Lymphs Abs: 2.8 10*3/uL (ref 0.7–4.0)
MCHC: 32.9 g/dL (ref 30.0–36.0)
MCV: 85.2 fl (ref 78.0–100.0)
Monocytes Absolute: 0.4 10*3/uL (ref 0.1–1.0)
Neutrophils Relative %: 42.2 % — ABNORMAL LOW (ref 43.0–77.0)
Platelets: 274 10*3/uL (ref 150.0–400.0)
RDW: 14.2 % (ref 11.5–14.6)
WBC: 6 10*3/uL (ref 4.5–10.5)

## 2011-05-20 LAB — SEDIMENTATION RATE: Sed Rate: 11 mm/hr (ref 0–22)

## 2011-05-20 LAB — LIPID PANEL
HDL: 53.7 mg/dL (ref >39.00)
Total CHOL/HDL Ratio: 3
Triglycerides: 131 mg/dL (ref 0.0–149.0)
VLDL: 26.2 mg/dL (ref 0.0–40.0)

## 2011-06-23 ENCOUNTER — Other Ambulatory Visit: Payer: Self-pay | Admitting: Family Medicine

## 2011-06-23 DIAGNOSIS — G8929 Other chronic pain: Secondary | ICD-10-CM

## 2011-06-23 MED ORDER — HYDROCODONE-ACETAMINOPHEN 7.5-750 MG PO TABS: 1.0000 | ORAL_TABLET | Freq: Four times a day (QID) | ORAL | Status: DC | PRN

## 2011-06-23 NOTE — Telephone Encounter (Signed)
Faxed.   KP 

## 2011-06-23 NOTE — Telephone Encounter (Signed)
Last seen and filled 05/12/11  Please advise     KP

## 2011-06-30 ENCOUNTER — Other Ambulatory Visit: Payer: Self-pay | Admitting: Family Medicine

## 2011-07-21 ENCOUNTER — Other Ambulatory Visit: Payer: Self-pay | Admitting: Family Medicine

## 2011-07-22 ENCOUNTER — Other Ambulatory Visit: Payer: Self-pay | Admitting: Family Medicine

## 2011-07-22 DIAGNOSIS — G8929 Other chronic pain: Secondary | ICD-10-CM

## 2011-07-22 NOTE — Telephone Encounter (Signed)
Last OV 05-12-11, last filled 06-23-11 #120

## 2011-07-22 NOTE — Telephone Encounter (Signed)
Refill sent, pt will be due for lipid check in Nov 2012.

## 2011-07-23 MED ORDER — HYDROCODONE-ACETAMINOPHEN 7.5-750 MG PO TABS: 1.0000 | ORAL_TABLET | Freq: Four times a day (QID) | ORAL | Status: DC | PRN

## 2011-07-23 NOTE — Telephone Encounter (Signed)
Refill x1 

## 2011-07-23 NOTE — Telephone Encounter (Signed)
Rx faxed.    KP 

## 2011-07-28 ENCOUNTER — Emergency Department (HOSPITAL_COMMUNITY)
Admission: EM | Admit: 2011-07-28 | Discharge: 2011-07-28 | Disposition: A | Discharge status: Home / Self Care | Payer: Medicare Other | Attending: Emergency Medicine | Admitting: Emergency Medicine

## 2011-07-28 DIAGNOSIS — R112 Nausea with vomiting, unspecified: Secondary | ICD-10-CM | POA: Insufficient documentation

## 2011-07-28 DIAGNOSIS — R197 Diarrhea, unspecified: Secondary | ICD-10-CM | POA: Insufficient documentation

## 2011-07-28 DIAGNOSIS — K3184 Gastroparesis: Secondary | ICD-10-CM | POA: Insufficient documentation

## 2011-07-28 DIAGNOSIS — E1149 Type 2 diabetes mellitus with other diabetic neurological complication: Secondary | ICD-10-CM | POA: Insufficient documentation

## 2011-07-28 DIAGNOSIS — I10 Essential (primary) hypertension: Secondary | ICD-10-CM | POA: Insufficient documentation

## 2011-07-28 LAB — DIFFERENTIAL
Eosinophils Relative: 1 % (ref 0–5)
Lymphocytes Relative: 11 % — ABNORMAL LOW (ref 12–46)
Lymphs Abs: 0.9 10*3/uL (ref 0.7–4.0)
Monocytes Absolute: 0.4 10*3/uL (ref 0.1–1.0)

## 2011-07-28 LAB — POCT I-STAT, CHEM 8
Chloride: 102 mEq/L (ref 96–112)
Creatinine, Ser: 0.8 mg/dL (ref 0.50–1.10)
Glucose, Bld: 103 mg/dL — ABNORMAL HIGH (ref 70–99)
HCT: 45 % (ref 36.0–46.0)
Potassium: 4.3 mEq/L (ref 3.5–5.1)

## 2011-07-28 LAB — CBC
HCT: 41 % (ref 36.0–46.0)
MCH: 26.5 pg (ref 26.0–34.0)
MCV: 81.7 fL (ref 78.0–100.0)
Platelets: 296 10*3/uL (ref 150–400)
RDW: 13.8 % (ref 11.5–15.5)

## 2011-07-28 LAB — URINALYSIS, ROUTINE W REFLEX MICROSCOPIC
Bilirubin Urine: NEGATIVE
Hgb urine dipstick: NEGATIVE
Ketones, ur: NEGATIVE mg/dL
Nitrite: NEGATIVE
Urobilinogen, UA: 0.2 mg/dL (ref 0.0–1.0)

## 2011-07-28 LAB — URINE MICROSCOPIC-ADD ON

## 2011-07-29 LAB — URINE CULTURE: Culture  Setup Time: 201207300940

## 2011-08-06 ENCOUNTER — Other Ambulatory Visit: Payer: Self-pay | Admitting: Family Medicine

## 2011-08-06 ENCOUNTER — Ambulatory Visit (INDEPENDENT_AMBULATORY_CARE_PROVIDER_SITE_OTHER): Payer: Medicare Other | Admitting: Family Medicine

## 2011-08-06 ENCOUNTER — Encounter: Payer: Self-pay | Admitting: Family Medicine

## 2011-08-06 VITALS — BP 160/76 | HR 111 | Temp 98.6°F | Wt 194.0 lb

## 2011-08-06 DIAGNOSIS — E1149 Type 2 diabetes mellitus with other diabetic neurological complication: Secondary | ICD-10-CM

## 2011-08-06 DIAGNOSIS — R5381 Other malaise: Secondary | ICD-10-CM

## 2011-08-06 DIAGNOSIS — I10 Essential (primary) hypertension: Secondary | ICD-10-CM

## 2011-08-06 DIAGNOSIS — K3184 Gastroparesis: Secondary | ICD-10-CM

## 2011-08-06 DIAGNOSIS — R5383 Other fatigue: Secondary | ICD-10-CM

## 2011-08-06 DIAGNOSIS — K219 Gastro-esophageal reflux disease without esophagitis: Secondary | ICD-10-CM

## 2011-08-06 DIAGNOSIS — E785 Hyperlipidemia, unspecified: Secondary | ICD-10-CM

## 2011-08-06 DIAGNOSIS — E119 Type 2 diabetes mellitus without complications: Secondary | ICD-10-CM

## 2011-08-06 LAB — T4, FREE: Free T4: 0.78 ng/dL (ref 0.60–1.60)

## 2011-08-06 MED ORDER — AMLODIPINE BESYLATE 5 MG PO TABS: 5.0000 mg | ORAL_TABLET | Freq: Every day | ORAL | Status: DC

## 2011-08-06 MED ORDER — OMEPRAZOLE 20 MG PO CPDR: 20.0000 mg | DELAYED_RELEASE_CAPSULE | Freq: Every day | ORAL | Status: DC

## 2011-08-06 NOTE — Telephone Encounter (Signed)
Last OV 08-06-11, not on med list or refill by you .Please advise

## 2011-08-06 NOTE — Patient Instructions (Signed)
Fatigue  Fatigue is a feeling of tiredness, lack of energy, lack of motivation, or feeling tired all the time. Having enough rest, good nutrition, and reducing stress will normally reduce fatigue. Consult your caregiver if it persists. The nature of your fatigue will help your caregiver to find out its cause. The treatment is based on the cause.   CAUSES  There are many causes for fatigue. Most of the time, fatigue can be traced to one or more of your habits or routines. Most causes fit into one or more of three general areas. They are:  Lifestyle problems  · Sleep disturbances.  · Overwork.   · Physical exertion.  · Unhealthy habits  · Poor eating habits or eating disorders   · Alcohol and/or drug use   · Lack of proper nutrition (malnutrition).    Psychological problems  · Stress and/or anxiety problems.  · Depression.  · Grief.  · Boredom.    Medical Problems or Conditions  · Anemia.  · Pregnancy.   · Thyroid gland problems.   · Recovery from major surgery.   · Continuous pain.   · Emphysema or asthma that is not well controlled   · Allergic conditions.   · Diabetes.   · Infections (such as mononucleosis).   · Obesity.  · Sleep disorders, such as sleep apnea.  · Heart failure or other heart-related problems.   · Cancer.   · Kidney disease.   · Liver disease.   · Effects of certain medicines such as antihistamines, cough and cold remedies, prescription pain medicines, heart and blood pressure medicines, drugs used for treatment of cancer, and some antidepressants.    SYMPTOMS  The symptoms of fatigue include:   · Lack of energy.  · Lack of drive (motivation).  · Drowsiness.  · Feeling of indifference to the surroundings.    DIAGNOSIS  The details of how you feel help guide your caregiver in finding out what is causing the fatigue. You will be asked about your present and past health condition. It is important to review all medicines that you take, including prescription and non-prescription items. A thorough exam  will be done. You will be questioned about your feelings, habits, and normal lifestyle. Your caregiver may suggest blood tests, urine tests, or other tests to look for common medical causes of fatigue.   TREATMENT  Fatigue is treated by correcting the underlying cause. For example, if you have continuous pain or depression, treating these causes will improve how you feel. Similarly, adjusting the dose of certain medicines will help in reducing fatigue.   HOME CARE INSTRUCTIONS  · Try to get the required amount of good sleep every night.   · Eat a healthy and nutritious diet, and drink enough water throughout the day.   · Practice ways of relaxing (including yoga or meditation).   · Exercise regularly.   · Make plans to change situations that cause stress. Act on those plans so that stresses decrease over time. Keep your work and personal routine reasonable.   · Avoid street drugs and minimize use of alcohol.   · Start taking a daily multivitamin after consulting your caregiver.   SEEK MEDICAL CARE IF:  · You have persistent tiredness, which cannot be accounted for.   · You have fever.   · You have unintentional weight loss.   · You have headaches.   · You have disturbed sleep throughout the night.   · You are feeling sad.   ·   You have constipation.   · You have dry skin.   · You have gained weight.   · You are taking any new or different medicines that you suspect are causing fatigue.   · You are unable to sleep at night.   · You develop any unusual swelling of your legs or other parts of your body.   SEEK IMMEDIATE MEDICAL CARE IF:  · You are feeling confused.   · Your vision is blurred.   · You feel faint or pass out.   · You develop severe headache.   · You develop severe abdominal, pelvic, or back pain.   · You develop chest pain, shortness of breath, or an irregular or fast heartbeat.   · You are unable to pass a normal amount of urine.   · You develop abnormal bleeding such as bleeding from the rectum or you  vomit blood.   · You have thoughts about harming yourself or committing suicide.   · You are worried that you might harm someone else.   MAKE SURE YOU:   · Understand these instructions.   · Will watch your condition.   · Will get help right away if you are not doing well or get worse.   REFERENCES   · National Library of Medicine   http://www.nlm.nih.gov/medlineplus/ency/article/003088.htm  · National Cancer Institute   http://www.cancer.gov/cancertopics/pdq/supportivecare/fatigue/Patient  Document Released: 10/12/2007 Document Re-Released: 11/27/2008  ExitCare® Patient Information ©2011 [employer].

## 2011-08-06 NOTE — Assessment & Plan Note (Signed)
Check labs 

## 2011-08-06 NOTE — Assessment & Plan Note (Signed)
Labs reviewed with pt 

## 2011-08-06 NOTE — Assessment & Plan Note (Signed)
Stable con't meds 

## 2011-08-06 NOTE — Progress Notes (Signed)
  Subjective:    Patient ID: Stacey Leon, female    DOB: 14-Jun-1941, 70 y.o.   MRN: [id number]  HPI  Pt here to go over labs.  Pt still c/o abd pain but never started reglan.  She was afraid to.  Pt also states lyrica helps with a lot of the other pain but it wears off.  She is wondering if taking it 3x a day will help.    Review of Systems As above    Objective:   Physical Exam  Constitutional: She is oriented to person, place, and time. She appears well-developed and well-nourished.  Neurological: She is alert and oriented to person, place, and time.  Psychiatric: She has a normal mood and affect. Her behavior is normal. Judgment and thought content normal.          Assessment & Plan:

## 2011-08-06 NOTE — Assessment & Plan Note (Signed)
con't meds Reviewed labs with pt

## 2011-08-06 NOTE — Telephone Encounter (Signed)
Who wrote trazadone?--- its not on our med list

## 2011-08-06 NOTE — Assessment & Plan Note (Signed)
con't meds F/u GI prn

## 2011-08-06 NOTE — Assessment & Plan Note (Signed)
con't lyrica and you may increase to tid

## 2011-08-06 NOTE — Assessment & Plan Note (Signed)
Encouraged pt to take reglan F/u GI prn

## 2011-08-07 NOTE — Telephone Encounter (Signed)
Spoke with Pt who states that she did not request med and is unsure who Rx med. Pt notes that she does not need this med.

## 2011-08-11 NOTE — Progress Notes (Signed)
Pt aware of results 

## 2011-08-19 ENCOUNTER — Other Ambulatory Visit: Payer: Self-pay | Admitting: *Deleted

## 2011-08-19 DIAGNOSIS — G8929 Other chronic pain: Secondary | ICD-10-CM

## 2011-08-19 MED ORDER — HYDROCODONE-ACETAMINOPHEN 7.5-750 MG PO TABS: 1.0000 | ORAL_TABLET | Freq: Four times a day (QID) | ORAL | Status: DC | PRN

## 2011-08-19 NOTE — Telephone Encounter (Signed)
Faxed.   KP 

## 2011-08-19 NOTE — Telephone Encounter (Signed)
Refill x1 

## 2011-08-19 NOTE — Telephone Encounter (Signed)
Last Ov 08-06-11, last filled 07-23-11 #120

## 2011-08-25 ENCOUNTER — Telehealth: Payer: Self-pay | Admitting: *Deleted

## 2011-08-25 MED ORDER — PREGABALIN 75 MG PO CAPS: 75.0000 mg | ORAL_CAPSULE | Freq: Three times a day (TID) | ORAL | Status: DC

## 2011-08-25 NOTE — Telephone Encounter (Signed)
Pt states that lyrica 75 mg  Tid is working. Pt is now requesting a new Rx with new direction sent in to pharmacy. Last OV 08-06-11, last filled 06-30-11 #60 2

## 2011-08-25 NOTE — Telephone Encounter (Signed)
Ok to change

## 2011-08-25 NOTE — Telephone Encounter (Signed)
Changed to Lyrica 75 TID # [id number] with 2 refill sent to [facility]

## 2011-09-22 ENCOUNTER — Other Ambulatory Visit: Payer: Self-pay | Admitting: Family Medicine

## 2011-09-22 DIAGNOSIS — G8929 Other chronic pain: Secondary | ICD-10-CM

## 2011-09-23 MED ORDER — HYDROCODONE-ACETAMINOPHEN 7.5-750 MG PO TABS: 1.0000 | ORAL_TABLET | Freq: Four times a day (QID) | ORAL | Status: DC | PRN

## 2011-09-23 NOTE — Telephone Encounter (Signed)
Faxed.   KP 

## 2011-09-23 NOTE — Telephone Encounter (Signed)
Last seen 08/06/11 and filled 08/19/11 please advise    KP

## 2011-09-30 ENCOUNTER — Other Ambulatory Visit: Payer: Self-pay

## 2011-09-30 MED ORDER — FLUOCINONIDE 0.05 % EX CREA: TOPICAL_CREAM | Freq: Two times a day (BID) | CUTANEOUS | Status: DC

## 2011-10-01 ENCOUNTER — Other Ambulatory Visit: Payer: Self-pay | Admitting: Gastroenterology

## 2011-10-28 ENCOUNTER — Other Ambulatory Visit: Payer: Self-pay | Admitting: Family Medicine

## 2011-10-28 DIAGNOSIS — G8929 Other chronic pain: Secondary | ICD-10-CM

## 2011-10-28 MED ORDER — HYDROCODONE-ACETAMINOPHEN 7.5-750 MG PO TABS: 1.0000 | ORAL_TABLET | Freq: Four times a day (QID) | ORAL | Status: DC | PRN

## 2011-10-28 NOTE — Telephone Encounter (Signed)
Last seen 08/06/11 and filled 09/23/11 please advise    KP

## 2011-10-28 NOTE — Telephone Encounter (Signed)
Faxed.   KP 

## 2011-11-26 ENCOUNTER — Ambulatory Visit (INDEPENDENT_AMBULATORY_CARE_PROVIDER_SITE_OTHER): Payer: Medicare Other | Admitting: Family Medicine

## 2011-11-26 ENCOUNTER — Other Ambulatory Visit: Payer: Self-pay | Admitting: Family Medicine

## 2011-11-26 ENCOUNTER — Encounter: Payer: Self-pay | Admitting: Family Medicine

## 2011-11-26 VITALS — BP 132/70 | HR 87 | Temp 98.7°F | Wt 197.2 lb

## 2011-11-26 DIAGNOSIS — Z0000 Encounter for general adult medical examination without abnormal findings: Secondary | ICD-10-CM

## 2011-11-26 DIAGNOSIS — B86 Scabies: Secondary | ICD-10-CM

## 2011-11-26 DIAGNOSIS — G8929 Other chronic pain: Secondary | ICD-10-CM

## 2011-11-26 DIAGNOSIS — E119 Type 2 diabetes mellitus without complications: Secondary | ICD-10-CM

## 2011-11-26 MED ORDER — PERMETHRIN 5 % EX CREA: TOPICAL_CREAM | CUTANEOUS | Status: DC

## 2011-11-26 MED ORDER — ZOSTER VACCINE LIVE 19400 UNT/0.65ML ~~LOC~~ SOLR: 0.6500 mL | Freq: Once | SUBCUTANEOUS | Status: AC

## 2011-11-26 NOTE — Progress Notes (Signed)
  Subjective:    Patient ID: Stacey Leon, female    DOB: 1941-03-12, 70 y.o.   MRN: [id number]  HPI Pt here c/o rash on mid and low back for several days that is very itchy.  It is also now on her thighs and arms.    Review of Systems    as above Objective:   Physical Exam  Constitutional: She is oriented to person, place, and time. She appears well-developed and well-nourished.  Neurological: She is alert and oriented to person, place, and time.  Skin: Rash noted. There is erythema.       + escoriations mid and low back and thighs.  + papules and errythema ---c/w scabies  Nothing on hands          Assessment & Plan:

## 2011-11-26 NOTE — Patient Instructions (Signed)
Scabies Scabies are small bugs (mites) that burrow under the skin and cause red bumps and severe itching. These bugs can only be seen with a microscope. Scabies are highly contagious. They can spread easily from person to person by direct contact. They are also spread through sharing clothing or linens that have the scabies mites living in them. It is not unusual for an entire family to become infected through shared towels, clothing, or bedding.   HOME CARE INSTRUCTIONS    Your caregiver may prescribe a cream or lotion to kill the mites. If this cream is prescribed; massage the cream into the entire area of the body from the neck to the bottom of both feet. Also massage the cream into the scalp and face if your child is less than 1 year old. Avoid the eyes and mouth.     Leave the cream on for 8 to12 hours. Do not wash your hands after application. Your child should bathe or shower after the 8 to 12 hour application period. Sometimes it is helpful to apply the cream to your child at right before bedtime.     One treatment is usually effective and will eliminate approximately 95% of infestations. For severe cases, your caregiver may decide to repeat the treatment in 1 week. Everyone in your household should be treated with one application of the cream.     New rashes or burrows should not appear after successful treatment within 24 to 48 hours; however the itching and rash may last for 2 to 4 weeks after successful treatment. If your symptoms persist longer than this, see your caregiver.     Your caregiver also may prescribe a medication to help with the itching or to help the rash go away more quickly.     Scabies can live on clothing or linens for up to 3 days. Your entire child's recently used clothing, towels, stuffed toys, and bed linens should be washed in hot water and then dried in a dryer for at least 20 minutes on high heat. Items that cannot be washed should be enclosed in a plastic bag for  at least 3 days.     To help relieve itching, bathe your child in a cool bath or apply cool washcloths to the affected areas.     Your child may return to school after treatment with the prescribed cream.  SEEK MEDICAL CARE IF:    The itching persists longer than 4 weeks after treatment.     The rash spreads or becomes infected (the area has red blisters or yellow-tan crust).  Document Released: 12/15/2005 Document Revised: 08/27/2011 Document Reviewed: 04/25/2009 ExitCare Patient Information 2012 [employer]. 

## 2011-11-26 NOTE — Telephone Encounter (Signed)
Last seen 11/26/11 and filled 10/28/11   Please advise    KP----Dr.Lowne patient

## 2011-11-26 NOTE — Assessment & Plan Note (Signed)
elmite cream  And instructions given to pt in writing and verbally

## 2011-11-27 MED ORDER — HYDROCODONE-ACETAMINOPHEN 7.5-750 MG PO TABS: 1.0000 | ORAL_TABLET | Freq: Four times a day (QID) | ORAL | Status: DC | PRN

## 2011-11-27 NOTE — Telephone Encounter (Signed)
Faxed.   KP 

## 2011-11-27 NOTE — Telephone Encounter (Signed)
Ok for #30, no refills 

## 2011-12-01 ENCOUNTER — Other Ambulatory Visit: Payer: Self-pay | Admitting: Family Medicine

## 2011-12-01 DIAGNOSIS — G8929 Other chronic pain: Secondary | ICD-10-CM

## 2011-12-01 NOTE — Telephone Encounter (Signed)
Patient needs refill hydrocodone -[facility] - needs 30 day supply - she said she only got 7 day supply last week

## 2011-12-01 NOTE — Telephone Encounter (Addendum)
Dr.Tabori only approved 30 pills  on Thursday. Please advise    KP

## 2011-12-02 ENCOUNTER — Other Ambulatory Visit: Payer: Self-pay | Admitting: Obstetrics and Gynecology

## 2011-12-02 DIAGNOSIS — N644 Mastodynia: Secondary | ICD-10-CM

## 2011-12-02 MED ORDER — HYDROCODONE-ACETAMINOPHEN 7.5-750 MG PO TABS: 1.0000 | ORAL_TABLET | Freq: Four times a day (QID) | ORAL | Status: DC | PRN

## 2011-12-02 NOTE — Telephone Encounter (Signed)
Faxed.   KP 

## 2011-12-02 NOTE — Telephone Encounter (Signed)
done

## 2011-12-05 ENCOUNTER — Encounter: Payer: Self-pay | Admitting: Family Medicine

## 2011-12-05 ENCOUNTER — Ambulatory Visit (INDEPENDENT_AMBULATORY_CARE_PROVIDER_SITE_OTHER): Payer: Medicare Other | Admitting: Family Medicine

## 2011-12-05 DIAGNOSIS — E119 Type 2 diabetes mellitus without complications: Secondary | ICD-10-CM

## 2011-12-05 DIAGNOSIS — L309 Dermatitis, unspecified: Secondary | ICD-10-CM

## 2011-12-05 DIAGNOSIS — K589 Irritable bowel syndrome without diarrhea: Secondary | ICD-10-CM

## 2011-12-05 DIAGNOSIS — Z780 Asymptomatic menopausal state: Secondary | ICD-10-CM

## 2011-12-05 DIAGNOSIS — K219 Gastro-esophageal reflux disease without esophagitis: Secondary | ICD-10-CM

## 2011-12-05 DIAGNOSIS — E785 Hyperlipidemia, unspecified: Secondary | ICD-10-CM

## 2011-12-05 DIAGNOSIS — I10 Essential (primary) hypertension: Secondary | ICD-10-CM

## 2011-12-05 MED ORDER — ESTRADIOL 1 MG PO TABS: 1.0000 mg | ORAL_TABLET | Freq: Every day | ORAL | Status: DC

## 2011-12-05 MED ORDER — OMEPRAZOLE 20 MG PO CPDR: 20.0000 mg | DELAYED_RELEASE_CAPSULE | Freq: Every day | ORAL | Status: DC

## 2011-12-05 MED ORDER — AMLODIPINE BESYLATE 5 MG PO TABS: 5.0000 mg | ORAL_TABLET | Freq: Every day | ORAL | Status: DC

## 2011-12-05 MED ORDER — ROSUVASTATIN CALCIUM 5 MG PO TABS: 5.0000 mg | ORAL_TABLET | Freq: Every day | ORAL | Status: DC

## 2011-12-05 MED ORDER — TORSEMIDE 20 MG PO TABS: 20.0000 mg | ORAL_TABLET | Freq: Every day | ORAL | Status: DC

## 2011-12-05 MED ORDER — FLUOCINONIDE 0.05 % EX CREA: TOPICAL_CREAM | Freq: Two times a day (BID) | CUTANEOUS | Status: DC

## 2011-12-05 MED ORDER — DICYCLOMINE HCL 10 MG PO CAPS: 10.0000 mg | ORAL_CAPSULE | Freq: Three times a day (TID) | ORAL | Status: DC

## 2011-12-05 MED ORDER — PREGABALIN 75 MG PO CAPS: 75.0000 mg | ORAL_CAPSULE | Freq: Three times a day (TID) | ORAL | Status: DC

## 2011-12-05 MED ORDER — ERGOCALCIFEROL 1.25 MG (50000 UT) PO CAPS: 50000.0000 [IU] | ORAL_CAPSULE | ORAL | Status: AC

## 2011-12-05 MED ORDER — SITAGLIPTIN PHOSPHATE 100 MG PO TABS: 100.0000 mg | ORAL_TABLET | Freq: Every day | ORAL | Status: DC

## 2011-12-05 MED ORDER — QUINAPRIL HCL 40 MG PO TABS: 40.0000 mg | ORAL_TABLET | Freq: Every day | ORAL | Status: DC

## 2011-12-06 NOTE — Assessment & Plan Note (Signed)
Labs due - pt not fasting today con't meds

## 2011-12-06 NOTE — Assessment & Plan Note (Signed)
Check labs con't meds 

## 2011-12-06 NOTE — Progress Notes (Signed)
  Subjective:    Patient ID: Stacey Leon, female    DOB: 06-20-1941, 71 y.o.   MRN: [id number]  HPI Pt here f/u scabies which is much better.  She also needs refills on meds.   No other complaints.     Review of Systems    as above Objective:   Physical Exam  Constitutional: She appears well-developed and well-nourished.  Cardiovascular: Normal rate, regular rhythm and normal heart sounds.   No murmur heard. Pulmonary/Chest: Effort normal and breath sounds normal.  Skin: Skin is warm and dry. No rash noted. No erythema. No pallor.  Psychiatric: She has a normal mood and affect.          Assessment & Plan:  Scabies---resolved All meds refilled

## 2011-12-06 NOTE — Assessment & Plan Note (Signed)
Stable con't meds 

## 2011-12-08 ENCOUNTER — Other Ambulatory Visit: Payer: BC Managed Care – PPO

## 2011-12-16 ENCOUNTER — Ambulatory Visit
Admission: RE | Admit: 2011-12-16 | Discharge: 2011-12-16 | Disposition: A | Discharge status: Home / Self Care | Payer: Medicare Other | Source: Ambulatory Visit | Attending: Obstetrics and Gynecology | Admitting: Obstetrics and Gynecology

## 2011-12-16 ENCOUNTER — Other Ambulatory Visit (INDEPENDENT_AMBULATORY_CARE_PROVIDER_SITE_OTHER): Payer: Medicare Other

## 2011-12-16 ENCOUNTER — Other Ambulatory Visit: Payer: Self-pay | Admitting: Family Medicine

## 2011-12-16 DIAGNOSIS — N644 Mastodynia: Secondary | ICD-10-CM

## 2011-12-16 DIAGNOSIS — E785 Hyperlipidemia, unspecified: Secondary | ICD-10-CM

## 2011-12-16 DIAGNOSIS — E119 Type 2 diabetes mellitus without complications: Secondary | ICD-10-CM

## 2011-12-16 LAB — BASIC METABOLIC PANEL
Calcium: 9.2 mg/dL (ref 8.4–10.5)
Creatinine, Ser: 0.8 mg/dL (ref 0.4–1.2)
GFR: 94.45 mL/min (ref >60.00)
Sodium: 139 mEq/L (ref 135–145)

## 2011-12-16 LAB — HEPATIC FUNCTION PANEL
AST: 18 U/L (ref 0–37)
Albumin: 4 g/dL (ref 3.5–5.2)
Alkaline Phosphatase: 73 U/L (ref 39–117)
Total Protein: 6.4 g/dL (ref 6.0–8.3)

## 2011-12-16 LAB — HEMOGLOBIN A1C: Hgb A1c MFr Bld: 6.5 % (ref 4.6–6.5)

## 2011-12-16 LAB — LDL CHOLESTEROL, DIRECT: Direct LDL: 133.6 mg/dL

## 2012-01-08 ENCOUNTER — Other Ambulatory Visit: Payer: Self-pay | Admitting: Family Medicine

## 2012-01-08 DIAGNOSIS — G8929 Other chronic pain: Secondary | ICD-10-CM

## 2012-01-08 NOTE — Telephone Encounter (Signed)
Last seen 12/05/11 and filled 12/02/11 # 120. Please advise    KP

## 2012-01-08 NOTE — Telephone Encounter (Signed)
Refill x1 

## 2012-01-09 MED ORDER — HYDROCODONE-ACETAMINOPHEN 7.5-750 MG PO TABS: 1.0000 | ORAL_TABLET | Freq: Four times a day (QID) | ORAL | Status: DC | PRN

## 2012-01-09 NOTE — Telephone Encounter (Signed)
Faxed.   KP 

## 2012-02-13 ENCOUNTER — Telehealth: Payer: Self-pay | Admitting: Family Medicine

## 2012-02-13 DIAGNOSIS — G8929 Other chronic pain: Secondary | ICD-10-CM

## 2012-02-13 MED ORDER — HYDROCODONE-ACETAMINOPHEN 7.5-750 MG PO TABS: 1.0000 | ORAL_TABLET | Freq: Four times a day (QID) | ORAL | Status: DC | PRN

## 2012-02-13 NOTE — Telephone Encounter (Signed)
lowne pt please Stacey Leon

## 2012-02-13 NOTE — Telephone Encounter (Signed)
Faxed.   KP 

## 2012-02-13 NOTE — Telephone Encounter (Signed)
Refill- hydrocodon-acetaminoph 7.5-750. Take one tablet by mouth every six hours as needed.

## 2012-02-17 ENCOUNTER — Telehealth: Payer: Self-pay | Admitting: Family Medicine

## 2012-02-17 NOTE — Telephone Encounter (Signed)
Patient is requesting 30 day supply of Quinapril. She states she was out of town and forgot to order through [facility]. She is currently out and would like it called into [facility] [address]

## 2012-02-18 ENCOUNTER — Other Ambulatory Visit: Payer: Self-pay

## 2012-02-18 MED ORDER — QUINAPRIL HCL 40 MG PO TABS: 40.0000 mg | ORAL_TABLET | Freq: Every day | ORAL | Status: DC

## 2012-02-18 NOTE — Telephone Encounter (Signed)
Rx sent 

## 2012-02-19 ENCOUNTER — Encounter: Payer: Self-pay | Admitting: Family Medicine

## 2012-02-19 ENCOUNTER — Ambulatory Visit (INDEPENDENT_AMBULATORY_CARE_PROVIDER_SITE_OTHER): Payer: Medicare Other | Admitting: Family Medicine

## 2012-02-19 VITALS — BP 132/60 | HR 84 | Temp 98.1°F | Wt 205.8 lb

## 2012-02-19 DIAGNOSIS — Z780 Asymptomatic menopausal state: Secondary | ICD-10-CM

## 2012-02-19 DIAGNOSIS — Z0000 Encounter for general adult medical examination without abnormal findings: Secondary | ICD-10-CM

## 2012-02-19 DIAGNOSIS — R350 Frequency of micturition: Secondary | ICD-10-CM

## 2012-02-19 DIAGNOSIS — E119 Type 2 diabetes mellitus without complications: Secondary | ICD-10-CM

## 2012-02-19 DIAGNOSIS — R3915 Urgency of urination: Secondary | ICD-10-CM

## 2012-02-19 DIAGNOSIS — IMO0001 Reserved for inherently not codable concepts without codable children: Secondary | ICD-10-CM

## 2012-02-19 LAB — POCT URINALYSIS DIPSTICK
Blood, UA: NEGATIVE
Glucose, UA: NEGATIVE
Nitrite, UA: NEGATIVE
Protein, UA: NEGATIVE
Spec Grav, UA: <=1.005
Urobilinogen, UA: 0.2
pH, UA: 6.5

## 2012-02-19 MED ORDER — ESTRADIOL 1 MG PO TABS: 1.0000 mg | ORAL_TABLET | Freq: Every day | ORAL | Status: DC

## 2012-02-19 MED ORDER — LEVOFLOXACIN 250 MG PO TABS: 250.0000 mg | ORAL_TABLET | Freq: Every day | ORAL | Status: AC

## 2012-02-19 MED ORDER — ONETOUCH ULTRA SYSTEM W/DEVICE KIT: 1.0000 | PACK | Freq: Once | Status: DC

## 2012-02-19 MED ORDER — ZOSTER VACCINE LIVE 19400 UNT/0.65ML ~~LOC~~ SOLR
0.6500 mL | Freq: Once | SUBCUTANEOUS | Status: AC
Start: 2012-02-19 — End: 2012-02-20

## 2012-02-19 MED ORDER — SITAGLIPTIN PHOSPHATE 100 MG PO TABS: 100.0000 mg | ORAL_TABLET | Freq: Every day | ORAL | Status: DC

## 2012-02-19 NOTE — Progress Notes (Signed)
  Subjective:    Stacey Leon is a 71 y.o. female who complains of frequency and urgency. She has had symptoms for 1 month. Patient also complains of none. Patient denies back pain, congestion, cough, fever, headache, rhinitis, sorethroat, stomach ache and vaginal discharge. Patient does not have a history of recurrent UTI. Patient does not have a history of pyelonephritis.  She did have a UTI in Dec in gyn office---incidental finding  The following portions of the patient's history were reviewed and updated as appropriate: allergies, current medications, past family history, past medical history, past social history, past surgical history and problem list.  Review of Systems Pertinent items are noted in HPI.    Objective:    BP 132/60  Pulse 84  Temp(Src) 98.1 F (36.7 C) (Oral)  Wt 205 lb 12.8 oz (93.35 kg)  SpO2 95% General appearance: alert, cooperative, appears stated age and no distress Abdomen: soft, non-tender; bowel sounds normal; no masses,  no organomegaly  Laboratory:  Urine dipstick: negative for all components.   Micro exam: not done.    Assessment:    urinary frequency and urgency    postmenopausal  thoracic back pain-- pt wanted note to say diabetes was the reason her condition did not heal faster---i explained that I could not do that.  Pt here > 25 min---> 50% face to face Plan:    Medications: levaquin. Maintain adequate hydration. Follow up if symptoms not improving, and as needed. check culture

## 2012-02-19 NOTE — Patient Instructions (Signed)
Urinary Frequency The number of times a normal person urinates depends upon how much liquid they take in and how much liquid they are losing. If the temperature is hot and there is high humidity then the person will sweat more and usually breathe a little more frequently. These factors decrease the amount of frequency of urination that would be considered normal. The amount you drink is easily determined, but the amount of fluid lost is sometimes more difficult to calculate.  Fluid is lost in two ways:  Sensible fluid loss is usually measured by the amount of urine that you get rid of. Losses of fluid can also occur with diarrhea.   Insensible fluid loss is more difficult to measure. It is caused by evaporation. Insensible loss of fluid occurs through breathing and sweating. It usually ranges from a little less than a quart to a little more than a quart of fluid a day.  In normal temperatures and activity levels the average person may urinate 4 to 7 times in a 24-hour period. Needing to urinate more often than that could indicate a problem. If one urinates 4 to 7 times in 24 hours and has large volumes each time, that could indicate a different problem from one who urinates 4 to 7 times a day and has small volumes. The time of urinating is also an important. Most urinating should be done during the waking hours. Getting up at night to urinate frequently can indicate some problems. CAUSES  The bladder is the organ in your lower abdomen that holds urine. Like a balloon, it swells some as it fills up. Your nerves sense this and tell you it is time to head for the bathroom. There are a number of reasons that you might feel the need to urinate more often than usual. They include:  Urinary tract infection. This is usually associated with other signs such as burning when you urinate.   In men, problems with the prostate (a walnut-size gland that is located near the tube that carries urine out of your body).  There are two reasons why the prostate can cause an increased frequency of urination:   An enlarged prostate that does not let the bladder empty well. If the bladder only half empties when you urinate then it only has half the capacity to fill before you have to urinate again.   The nerves in the bladder become more hypersensitive with an increased size of the prostate even if the bladder empties completely.   Pregnancy.   Obesity. Excess weight is more likely to cause a problem for women more than for men.   Bladder stones or other bladder problems.   Caffeine.   Alcohol.   Medications. For example, drugs that help the body get rid of extra fluid (diuretics) increase urine production. Some other medicines must be taken with lots of fluids.   Muscle or nerve weakness. This might be the result of a spinal cord injury, a stroke, multiple sclerosis or Parkinson's disease.   Long-standing diabetes can decrease the sensation of the bladder. This loss of sensation makes it harder to sense the bladder needs to be emptied. Over a period of years the bladder is stretched out by constant overfilling. This weakens the bladder muscles so that the bladder does not empty well and has less capacity to fill with new urine.   Interstitial cystitis (also called painful bladder syndrome). This condition develops because the tissues that line the insider of the bladder are inflamed (  inflammation is the body's way of reacting to injury or infection). It causes pain and frequent urination. It occurs in women more often than in men.  DIAGNOSIS   To decide what might be causing your urinary frequency, your healthcare provider will probably:   Ask about symptoms you have noticed.   Ask about your overall health. This will include questions about any medications you are taking.   Do a physical examination.   Order some tests. These might include:   A blood test to check for diabetes or other health issues  that could be contributing to the problem.   Urine testing. This could measure the flow of urine and the pressure on the bladder.   A test of your neurological system (the brain, spinal cord and nerves). This is the system that senses the need to urinate.   A bladder test to check whether it is emptying completely when you urinate.   Cytoscopy. This test uses a thin tube with a tiny camera on it. It offers a look inside your urethra and bladder to see if there are problems.   Imaging tests. You might be given a contrast dye and then asked to urinate. X-rays are taken to see how your bladder is working.  TREATMENT  It is important for you to be evaluated to determine if the amount or frequency that you have is unusual or abnormal. If it is found to be abnormal the cause should be determined and this can usually be found out easily. Depending upon the cause treatment could include medication, stimulation of the nerves, or surgery. There are not too many things that you can do as an individual to change your urinary frequency. It is important that you balance the amount of fluid intake needed to compensate for your activity and the temperature. Medical problems will be diagnosed and taken care of by your physician. There is no particular bladder training such as Kegel's exercises that you can do to help urinary frequency. This is an exercise this is usually done for people who have leaking of urine when they laugh cough or sneeze. HOME CARE INSTRUCTIONS   Take any medications your healthcare provider prescribed or suggested. Follow the directions carefully.   Practice any lifestyle changes that are recommended. These might include:   Drinking less fluid or drinking at different times of the day. If you need to urinate often during the night, for example, you may need to stop drinking fluids early in the evening.   Cutting down on caffeine or alcohol. They both can make you need to urinate more  often than normal. Caffeine is found in coffee, tea and sodas.   Losing weight, if that is recommended.   Keep a journal or a log. You might be asked to record how much you drink and when and when you feel the need to urinate. This will also help evaluate how well the treatment provided by your physician is working.  SEEK MEDICAL CARE IF:   Your need to urinate often gets worse.   You feel increased pain or irritation when you urinate.   You notice blood in your urine.   You have questions about any medications that your healthcare provider recommended.   You notice blood, pus or swelling at the site of any test or treatment procedure.   You develop a fever of more than 100.5 F (38.1 C).  SEEK IMMEDIATE MEDICAL CARE IF:  You develop a fever of more than 102.0   F (38.9 C). Document Released: 10/11/2009 Document Revised: 08/27/2011 Document Reviewed: 10/11/2009 ExitCare Patient Information 2012 [employer]. 

## 2012-03-10 ENCOUNTER — Other Ambulatory Visit: Payer: Self-pay | Admitting: *Deleted

## 2012-03-10 MED ORDER — PREGABALIN 75 MG PO CAPS: 75.0000 mg | ORAL_CAPSULE | Freq: Three times a day (TID) | ORAL | Status: DC

## 2012-03-10 NOTE — Telephone Encounter (Signed)
Ok to refill for 1 year 

## 2012-03-10 NOTE — Telephone Encounter (Signed)
Last OV 02-19-12, last filled 12-05-11 #270 3.

## 2012-03-10 NOTE — Telephone Encounter (Signed)
Rx sent 

## 2012-03-12 ENCOUNTER — Telehealth: Payer: Self-pay

## 2012-03-12 NOTE — Telephone Encounter (Signed)
Call from patient and she stated [facility] called her and told her they will not be able to send her Lyrica until the 25th of March. She wanted to know if she could get some samples until then.          KP

## 2012-03-12 NOTE — Telephone Encounter (Signed)
Yes, if we have them 

## 2012-03-12 NOTE — Telephone Encounter (Signed)
Samples left at check in     KP 

## 2012-03-15 ENCOUNTER — Telehealth: Payer: Self-pay | Admitting: Family Medicine

## 2012-03-15 DIAGNOSIS — G8929 Other chronic pain: Secondary | ICD-10-CM

## 2012-03-15 NOTE — Telephone Encounter (Signed)
Last seen 02/19/12 and filled 02/13/12 # 120. Please advise.     KP

## 2012-03-15 NOTE — Telephone Encounter (Signed)
Refill for   Hydrocodon-Acetaminoph 7.5-750  No qty listed  Take 1-tablet by mouth every six hours as needed

## 2012-03-16 MED ORDER — HYDROCODONE-ACETAMINOPHEN 7.5-750 MG PO TABS: 1.0000 | ORAL_TABLET | Freq: Four times a day (QID) | ORAL | Status: DC | PRN

## 2012-03-16 NOTE — Telephone Encounter (Signed)
Rx faxed 30 pills only.      KP

## 2012-03-16 NOTE — Telephone Encounter (Signed)
She can have 30 pills, one every 6 hours as needed. I cannot fill  long term prescription for this medicine as I have never seen or examined her.Dr Laury Axon can fill long term if she feels appropriate when she returns

## 2012-03-16 NOTE — Telephone Encounter (Signed)
Addended by: Arnette Norris on: 03/16/2012 09:45 AM   Modules accepted: Orders

## 2012-03-23 ENCOUNTER — Telehealth: Payer: Self-pay | Admitting: *Deleted

## 2012-03-23 DIAGNOSIS — I10 Essential (primary) hypertension: Secondary | ICD-10-CM

## 2012-03-23 DIAGNOSIS — G8929 Other chronic pain: Secondary | ICD-10-CM

## 2012-03-23 DIAGNOSIS — E119 Type 2 diabetes mellitus without complications: Secondary | ICD-10-CM

## 2012-03-23 MED ORDER — HYDROCODONE-ACETAMINOPHEN 7.5-750 MG PO TABS: 1.0000 | ORAL_TABLET | Freq: Four times a day (QID) | ORAL | Status: DC | PRN

## 2012-03-23 MED ORDER — AMLODIPINE BESYLATE 5 MG PO TABS: 5.0000 mg | ORAL_TABLET | Freq: Every day | ORAL | Status: DC

## 2012-03-23 NOTE — Telephone Encounter (Signed)
Ok to fill 120

## 2012-03-23 NOTE — Telephone Encounter (Signed)
Vicodin Last filled 03-16-12 #30 in your absent Pt normally get #120. Last OV.Please advise

## 2012-03-23 NOTE — Telephone Encounter (Signed)
Faxed.   KP 

## 2012-03-24 ENCOUNTER — Telehealth: Payer: Self-pay

## 2012-03-24 NOTE — Telephone Encounter (Signed)
ERROR

## 2012-04-01 ENCOUNTER — Encounter: Payer: Self-pay | Admitting: Family Medicine

## 2012-04-01 ENCOUNTER — Ambulatory Visit (INDEPENDENT_AMBULATORY_CARE_PROVIDER_SITE_OTHER): Payer: Medicare Other | Admitting: Family Medicine

## 2012-04-01 VITALS — BP 138/82 | HR 100 | Temp 100.0°F | Wt 197.0 lb

## 2012-04-01 DIAGNOSIS — J209 Acute bronchitis, unspecified: Secondary | ICD-10-CM

## 2012-04-01 DIAGNOSIS — Z9109 Other allergy status, other than to drugs and biological substances: Secondary | ICD-10-CM

## 2012-04-01 DIAGNOSIS — R05 Cough: Secondary | ICD-10-CM

## 2012-04-01 DIAGNOSIS — J309 Allergic rhinitis, unspecified: Secondary | ICD-10-CM

## 2012-04-01 DIAGNOSIS — J329 Chronic sinusitis, unspecified: Secondary | ICD-10-CM

## 2012-04-01 MED ORDER — GUAIFENESIN-CODEINE 100-10 MG/5ML PO SYRP: 5.0000 mL | ORAL_SOLUTION | Freq: Three times a day (TID) | ORAL | Status: AC | PRN

## 2012-04-01 MED ORDER — AMOXICILLIN-POT CLAVULANATE 875-125 MG PO TABS: 1.0000 | ORAL_TABLET | Freq: Two times a day (BID) | ORAL | Status: AC

## 2012-04-01 MED ORDER — OLOPATADINE HCL 0.1 % OP SOLN: 1.0000 [drp] | OPHTHALMIC | Status: DC

## 2012-04-01 MED ORDER — AZELASTINE-FLUTICASONE 137-50 MCG/ACT NA SUSP: 1.0000 | Freq: Two times a day (BID) | NASAL | Status: AC

## 2012-04-01 NOTE — Progress Notes (Signed)
  Subjective:     Stacey Leon is a 71 y.o. female who presents for evaluation of symptoms of a URI, possible sinusitis. Symptoms include congestion, cough described as productive, facial pain, nasal congestion, productive cough with  green colored sputum, purulent nasal discharge, shortness of breath, sinus pressure and wheezing. Onset of symptoms was 6 days ago, and has been gradually worsening since that time. Treatment to date: none.  The following portions of the patient's history were reviewed and updated as appropriate: allergies, current medications, past family history, past medical history, past social history, past surgical history and problem list.  Review of Systems Pertinent items are noted in HPI.   Objective:    BP 138/82  Pulse 100  Temp(Src) 100 F (37.8 C) (Oral)  Wt 197 lb (89.359 kg)  SpO2 96% General appearance: alert, cooperative, appears stated age and no distress Ears: normal TM's and external ear canals both ears Nose: green discharge, moderate congestion, turbinates red, swollen, sinus tenderness bilateral Throat: abnormal findings: mild oropharyngeal erythema and pnd Neck: mild anterior cervical adenopathy, supple, symmetrical, trachea midline and thyroid not enlarged, symmetric, no tenderness/mass/nodules Lungs: diminished breath sounds bibasilar Lymph nodes: Cervical adenopathy: b/l   Assessment:    bronchitis and sinusitis   Plan:    Discussed the diagnosis and treatment of sinusitis. Suggested symptomatic OTC remedies. Nasal saline spray for congestion. Augmentin per orders. Nasal steroids per orders. Follow up as needed. use inhalers

## 2012-04-01 NOTE — Patient Instructions (Signed)

## 2012-04-05 ENCOUNTER — Telehealth: Payer: Self-pay | Admitting: Pulmonary Disease

## 2012-04-05 NOTE — Telephone Encounter (Signed)
Pt is coming in tomorrow at 245 pm to see VS about CPAP issues.

## 2012-04-06 ENCOUNTER — Ambulatory Visit (INDEPENDENT_AMBULATORY_CARE_PROVIDER_SITE_OTHER): Payer: Medicare Other | Admitting: Pulmonary Disease

## 2012-04-06 ENCOUNTER — Encounter: Payer: Self-pay | Admitting: Pulmonary Disease

## 2012-04-06 VITALS — BP 150/80 | HR 94 | Temp 98.3°F | Ht 64.0 in | Wt 200.0 lb

## 2012-04-06 DIAGNOSIS — G4733 Obstructive sleep apnea (adult) (pediatric): Secondary | ICD-10-CM

## 2012-04-06 NOTE — Patient Instructions (Signed)
Will arrange for check of CPAP setting and call with results Follow up in one year

## 2012-04-06 NOTE — Assessment & Plan Note (Signed)
She is having more trouble with her CPAP set up.  It has been several years since she had her set up assessed.  She is now working with a different DME also.  Will arrange for auto CPAP titration at home and call her with results.  Will then determine if she needs adjustment in her CPAP pressure, or if she needs adjustment to her mask.

## 2012-04-06 NOTE — Progress Notes (Signed)
Chief Complaint  Patient presents with  . Follow-up    last OV 07/2010. Pt states she wears her cppa machine about 9 hrs a night. Pt states he mouth is opening at night, waking up with dry mouth and sore throat    History of Present Illness: Stacey Leon is a 71 y.o. female with OSA on CPAP.  I last saw her in 2011.  She has been using CPAP 10 cm H2O.  She now has a nasal mask.  She likes this mask.  She had to switch from [facility] to [facility] for her DME.  She has trouble with mouth dryness.  She does not think her pressure is high enough.  She also has to tighten her mask to keep the seal.  She is snoring more in spite of using her CPAP.  She does not feel like it is helping as much as when she was first set up.    Past Medical History  Diagnosis Date  . Diabetes mellitus   . Hyperlipemia   . Osteopenia   . Peripheral neuropathy   . GERD (gastroesophageal reflux disease)   . Iron deficiency anemia     Past Surgical History  Procedure Date  . Vaginal hysterectomy     Allergies  Allergen Reactions  . Ampicillin Hives and Itching    Physical Exam:  Blood pressure 150/80, pulse 94, temperature 98.3 F (36.8 C), temperature source Oral, height 5\' 4"  (1.626 m), weight 200 lb (90.719 kg), SpO2 97.00%. Body mass index is 34.33 kg/(m^2). Wt Readings from Last 2 Encounters:  04/06/12 200 lb (90.719 kg)  04/01/12 197 lb (89.359 kg)    General - Obese HEENT - no sinus tenderness, no oral exudate Cardiac - s1s2 regular Chest - no wheeze/rales Abdomen - soft, nontender Extremities - no edema Skin - no rashes Neurologic - normal strength Psychiatric - normal mood, behavior   Assessment/Plan:  Outpatient Encounter Prescriptions as of 04/06/2012  Medication Sig Dispense Refill  . amLODipine (NORVASC) 5 MG tablet Take 1 tablet (5 mg total) by mouth daily.  90 tablet  3  . amoxicillin-clavulanate (AUGMENTIN) 875-125 MG per tablet Take 1 tablet by mouth 2 (two) times daily.  20  tablet  0  . aspirin 81 MG tablet Take 81 mg by mouth daily.        . Azelastine-Fluticasone (DYMISTA) 137-50 MCG/ACT SUSP Place 1 spray into the nose 2 (two) times daily.      . beclomethasone (QVAR) 80 MCG/ACT inhaler Inhale 1 puff into the lungs as needed.        . Blood Glucose Monitoring Suppl (ONE TOUCH ULTRA SYSTEM KIT) W/DEVICE KIT 1 kit by Does not apply route once.  1 each  11  . Calcium Carbonate (CALTRATE 600) 1500 MG TABS Take by mouth. 1 po qd       . Casanthranol-Docusate Sodium 30-100 MG CAPS Take by mouth. 3 caps by mouth daily   Stool softner       . cetirizine (ZYRTEC) 10 MG tablet Take 10 mg by mouth daily.        Marland Kitchen co-enzyme Q-10 30 MG capsule Take 30 mg by mouth 1 dose over 46 hours.        Marland Kitchen dicyclomine (BENTYL) 10 MG capsule Take 1 capsule (10 mg total) by mouth 4 (four) times daily -  before meals and at bedtime.  360 capsule  3  . ergocalciferol (VITAMIN D2) 50000 UNITS capsule Take 1 capsule (50,000 Units  total) by mouth once a week.  4 capsule  12  . estradiol (ESTRACE) 1 MG tablet Take 1 tablet (1 mg total) by mouth daily.  90 tablet  3  . FeAsp-FeFum -Suc-C-Thre-B12-FA (MULTIGEN PLUS PO) Take by mouth 1 dose over 46 hours.        . fluocinonide cream (LIDEX) 0.05 % Apply 1 application topically 2 (two) times daily.      Marland Kitchen guaiFENesin-codeine (ROBITUSSIN AC) 100-10 MG/5ML syrup Take 5 mLs by mouth 3 (three) times daily as needed for cough.  120 mL  0  . HYDROcodone-acetaminophen (VICODIN ES) 7.5-750 MG per tablet Take 1 tablet by mouth every 6 (six) hours as needed.  120 tablet  0  . Inulin (FIBERCHOICE PO) Take by mouth. 2 tablets po daily       . olopatadine (PATANOL) 0.1 % ophthalmic solution Place 1 drop into both eyes 1 day or 1 dose.  5 mL  3  . omeprazole (PRILOSEC) 20 MG capsule Take 1 capsule (20 mg total) by mouth daily.  90 capsule  3  . pirbuterol (MAXAIR) 200 MCG/INH inhaler Inhale 2 puffs into the lungs 4 (four) times daily.        . pregabalin (LYRICA)  75 MG capsule Take 1 capsule (75 mg total) by mouth 3 (three) times daily.  270 capsule  3  . quinapril (ACCUPRIL) 40 MG tablet Take 1 tablet (40 mg total) by mouth at bedtime.  30 tablet  2  . sitaGLIPtin (JANUVIA) 100 MG tablet Take 1 tablet (100 mg total) by mouth daily.  90 tablet  3  . Thiamine HCl (VITAMIN B-1 PO) Take by mouth. 1 po qd       . torsemide (DEMADEX) 20 MG tablet Take 1 tablet (20 mg total) by mouth daily.  90 tablet  3  . vitamin E 200 UNIT capsule Take 200 Units by mouth daily.        . sitaGLIPtan (JANUVIA) 100 MG tablet Take 1 tablet (100 mg total) by mouth daily.  3 tablet  0  . DISCONTD: dextromethorphan-guaiFENesin (MUCINEX DM) 30-600 MG per 12 hr tablet Take 1 tablet by mouth every 12 (twelve) hours.        Marland Kitchen DISCONTD: fluconazole (DIFLUCAN) 150 MG tablet       . DISCONTD: fluocinonide cream (LIDEX) 0.05 % Apply topically 2 (two) times daily.  60 g  1  . DISCONTD: permethrin (ELIMITE) 5 % cream As directed  60 g  1  . DISCONTD: rosuvastatin (CRESTOR) 5 MG tablet Take 1 tablet (5 mg total) by mouth daily.  90 tablet  1    Kailey Esquilin Pager:  [phone] 04/06/2012, 3:11 PM

## 2012-04-18 ENCOUNTER — Other Ambulatory Visit: Payer: Self-pay | Admitting: Family Medicine

## 2012-04-22 ENCOUNTER — Other Ambulatory Visit: Payer: Self-pay | Admitting: Family Medicine

## 2012-04-22 DIAGNOSIS — G8929 Other chronic pain: Secondary | ICD-10-CM

## 2012-04-22 MED ORDER — HYDROCODONE-ACETAMINOPHEN 7.5-750 MG PO TABS: 1.0000 | ORAL_TABLET | Freq: Four times a day (QID) | ORAL | Status: DC | PRN

## 2012-04-22 NOTE — Telephone Encounter (Signed)
Last seen 04/01/12 and filled 03/23/2012 #120. Please advise     KP

## 2012-04-22 NOTE — Telephone Encounter (Signed)
Ok for #60, will need to wait for Dr Laury Axon for rest

## 2012-04-22 NOTE — Telephone Encounter (Signed)
Refill Hydrocodon-Acetaminph 7.5-750 Take 1-tablet by mouth every 6-hours as needed  Last written 3.26.13, qty 120 Last OV 4.9.13

## 2012-05-04 ENCOUNTER — Encounter: Payer: Self-pay | Admitting: Pulmonary Disease

## 2012-05-04 ENCOUNTER — Telehealth: Payer: Self-pay | Admitting: Pulmonary Disease

## 2012-05-04 DIAGNOSIS — G4733 Obstructive sleep apnea (adult) (pediatric): Secondary | ICD-10-CM

## 2012-05-04 NOTE — Telephone Encounter (Signed)
I spoke with patient about results and he verbalized understanding and had no questions. Aware order has been placed

## 2012-05-04 NOTE — Telephone Encounter (Signed)
Auto CPAP 04/09/12 to 04/23/12>>Used on 9 of 15 nights with average 4 hrs 36 min.  Average AHI 5 with mean CPAP 6 cm H2O and 90th percentile CPAP 8 cm H2O.  Attempted to call pt to discuss results.  Will send order to have her CPAP decreased from 10 to 7 cm H2O.  Will have my nurse call to confirm plan with patient.sil

## 2012-05-07 ENCOUNTER — Ambulatory Visit: Payer: Medicare Other | Admitting: Pulmonary Disease

## 2012-05-12 ENCOUNTER — Telehealth: Payer: Self-pay | Admitting: Family Medicine

## 2012-05-12 DIAGNOSIS — G8929 Other chronic pain: Secondary | ICD-10-CM

## 2012-05-12 MED ORDER — HYDROCODONE-ACETAMINOPHEN 7.5-750 MG PO TABS: 1.0000 | ORAL_TABLET | Freq: Four times a day (QID) | ORAL | Status: DC | PRN

## 2012-05-12 NOTE — Telephone Encounter (Signed)
refill Hydrocodon-Acetaminoph 7.5-750 Take one tablet by mouth every 6-hours as needed for pain  Last written 4.25.13 by Tabori for 60 Last written by Lowne qty 120  Last OV 4.9.13

## 2012-05-12 NOTE — Telephone Encounter (Signed)
i saw her 04/01/12  ---ok to refill x1

## 2012-05-12 NOTE — Telephone Encounter (Signed)
That was an error. I was trying to multitask.   Rx faxed     KP

## 2012-05-12 NOTE — Telephone Encounter (Signed)
Last seen 04/07/11 and filled 04/22/12 # 60, normally gets 120. Please advise    KP

## 2012-05-19 ENCOUNTER — Other Ambulatory Visit: Payer: Self-pay | Admitting: *Deleted

## 2012-05-19 DIAGNOSIS — K589 Irritable bowel syndrome without diarrhea: Secondary | ICD-10-CM

## 2012-05-19 MED ORDER — TORSEMIDE 20 MG PO TABS: 20.0000 mg | ORAL_TABLET | Freq: Every day | ORAL | Status: DC

## 2012-05-19 MED ORDER — QUINAPRIL HCL 40 MG PO TABS: 40.0000 mg | ORAL_TABLET | Freq: Every day | ORAL | Status: DC

## 2012-05-19 MED ORDER — DICYCLOMINE HCL 10 MG PO CAPS: 10.0000 mg | ORAL_CAPSULE | Freq: Three times a day (TID) | ORAL | Status: DC

## 2012-06-14 ENCOUNTER — Other Ambulatory Visit: Payer: Self-pay | Admitting: Family Medicine

## 2012-06-14 DIAGNOSIS — G8929 Other chronic pain: Secondary | ICD-10-CM

## 2012-06-14 MED ORDER — HYDROCODONE-ACETAMINOPHEN 7.5-750 MG PO TABS: 1.0000 | ORAL_TABLET | Freq: Four times a day (QID) | ORAL | Status: DC | PRN

## 2012-06-14 NOTE — Telephone Encounter (Signed)
Refill x1 

## 2012-06-14 NOTE — Telephone Encounter (Signed)
refill called into [facility] last Wednesday per patient-Hydrocodone She stated she called them today and they told her we denied the Refill. I do not see a recent refill request, can you review and send to [facility] if ok  Last fill 5.15.13, qty 120  Last OV 4.9.13

## 2012-06-14 NOTE — Telephone Encounter (Signed)
Faxed.   KP 

## 2012-06-14 NOTE — Telephone Encounter (Signed)
Last seen 04/01/12 and filled 05/12/12 #120. Please advise     KP

## 2012-07-08 ENCOUNTER — Other Ambulatory Visit: Payer: Self-pay | Admitting: Family Medicine

## 2012-07-08 DIAGNOSIS — G8929 Other chronic pain: Secondary | ICD-10-CM

## 2012-07-08 MED ORDER — HYDROCODONE-ACETAMINOPHEN 7.5-750 MG PO TABS: 1.0000 | ORAL_TABLET | Freq: Four times a day (QID) | ORAL | Status: DC | PRN

## 2012-07-08 NOTE — Telephone Encounter (Signed)
refillx1

## 2012-07-08 NOTE — Telephone Encounter (Signed)
Last seen 04/01/12 and filled 06/14/12 # 120. Please advise    KP

## 2012-07-25 ENCOUNTER — Other Ambulatory Visit: Payer: Self-pay | Admitting: Family Medicine

## 2012-07-30 ENCOUNTER — Ambulatory Visit (INDEPENDENT_AMBULATORY_CARE_PROVIDER_SITE_OTHER): Payer: Medicare Other | Admitting: Family Medicine

## 2012-07-30 ENCOUNTER — Encounter: Payer: Self-pay | Admitting: Family Medicine

## 2012-07-30 VITALS — BP 140/66 | HR 74 | Temp 98.2°F | Ht 64.0 in | Wt 200.4 lb

## 2012-07-30 DIAGNOSIS — I10 Essential (primary) hypertension: Secondary | ICD-10-CM

## 2012-07-30 DIAGNOSIS — R143 Flatulence: Secondary | ICD-10-CM

## 2012-07-30 DIAGNOSIS — E1149 Type 2 diabetes mellitus with other diabetic neurological complication: Secondary | ICD-10-CM

## 2012-07-30 DIAGNOSIS — J309 Allergic rhinitis, unspecified: Secondary | ICD-10-CM

## 2012-07-30 DIAGNOSIS — E785 Hyperlipidemia, unspecified: Secondary | ICD-10-CM

## 2012-07-30 DIAGNOSIS — E119 Type 2 diabetes mellitus without complications: Secondary | ICD-10-CM

## 2012-07-30 DIAGNOSIS — E1142 Type 2 diabetes mellitus with diabetic polyneuropathy: Secondary | ICD-10-CM

## 2012-07-30 DIAGNOSIS — E1140 Type 2 diabetes mellitus with diabetic neuropathy, unspecified: Secondary | ICD-10-CM

## 2012-07-30 DIAGNOSIS — R252 Cramp and spasm: Secondary | ICD-10-CM

## 2012-07-30 DIAGNOSIS — R141 Gas pain: Secondary | ICD-10-CM

## 2012-07-30 DIAGNOSIS — Z9109 Other allergy status, other than to drugs and biological substances: Secondary | ICD-10-CM

## 2012-07-30 LAB — HEMOGLOBIN A1C: Hgb A1c MFr Bld: 6.2 % — ABNORMAL HIGH (ref <5.7)

## 2012-07-30 MED ORDER — MULTIGEN PLUS 151-60-1 MG PO TABS: ORAL_TABLET | ORAL | Status: AC

## 2012-07-30 MED ORDER — FLUOCINONIDE 0.05 % EX CREA: 1.0000 "application " | TOPICAL_CREAM | Freq: Two times a day (BID) | CUTANEOUS | Status: DC

## 2012-07-30 MED ORDER — TORSEMIDE 20 MG PO TABS: ORAL_TABLET | ORAL | Status: DC

## 2012-07-30 MED ORDER — OLOPATADINE HCL 0.1 % OP SOLN: 1.0000 [drp] | OPHTHALMIC | Status: AC

## 2012-07-30 NOTE — Progress Notes (Signed)
Pt could not use the bathroom so a sent cup home with patient .Marland Kitchen

## 2012-07-30 NOTE — Patient Instructions (Addendum)
Leg Cramps  Leg cramps that occur during exercise can be caused by poor circulation or dehydration. However, muscle cramps that occur at rest or during the night are usually not due to any serious medical problem. Heat cramps may cause muscle spasms during hot weather.   CAUSES  There is no clear cause for muscle cramps. However, dehydration may be a factor for those who do not drink enough fluids and those who exercise in the heat. Imbalances in the level of sodium, potassium, calcium or magnesium in the muscle tissue may also be a factor. Some medications, such as water pills (diuretics), may cause loss of chemicals that the body needs (like sodium and potassium) and cause muscle cramps.  TREATMENT   · Make sure your diet has enough fluids and essential minerals for the muscle to work normally.  · Avoid strenuous exercise for several days if you have been having frequent leg cramps.  · Stretch and massage the cramped muscle for several minutes.  · Some medicines may be helpful in some patients with night cramps. Only take over-the-counter or prescription medicines as directed by your caregiver.  SEEK IMMEDIATE MEDICAL CARE IF:   · Your leg cramps become worse.  · Your foot becomes cold, numb, or blue.  Document Released: 01/22/2005 Document Revised: 12/04/2011 Document Reviewed: 01/09/2009  ExitCare® Patient Information ©2012 [employer].

## 2012-07-31 LAB — HEPATIC FUNCTION PANEL
ALT: 15 U/L (ref 0–35)
Total Protein: 6.9 g/dL (ref 6.0–8.3)

## 2012-07-31 LAB — BASIC METABOLIC PANEL
BUN: 12 mg/dL (ref 6–23)
Calcium: 9.2 mg/dL (ref 8.4–10.5)
Glucose, Bld: 94 mg/dL (ref 70–99)
Sodium: 141 mEq/L (ref 135–145)

## 2012-07-31 LAB — LIPID PANEL
Cholesterol: 228 mg/dL — ABNORMAL HIGH (ref 0–200)
Triglycerides: 125 mg/dL (ref <150)

## 2012-08-01 DIAGNOSIS — R252 Cramp and spasm: Secondary | ICD-10-CM | POA: Insufficient documentation

## 2012-08-01 NOTE — Assessment & Plan Note (Signed)
Check labs con't meds 

## 2012-08-01 NOTE — Progress Notes (Signed)
  Subjective:    Patient ID: Stacey Leon, female    DOB: [birth year], 71 y.o.   MRN: [id number]  HPI Pt here to have labs done and c/o leg cramps and abd bloating that has been going on for a while.  No inc sob or cp.  No calf pain.    Review of Systems As above    Objective:   Physical Exam  Constitutional: She is oriented to person, place, and time. She appears well-developed and well-nourished.  Cardiovascular: Normal rate and regular rhythm.   Pulmonary/Chest: Effort normal and breath sounds normal.  Abdominal: Soft. Bowel sounds are normal. She exhibits no distension and no mass. There is no tenderness. There is no rebound and no guarding.  Musculoskeletal: She exhibits no edema and no tenderness.  Neurological: She is alert and oriented to person, place, and time.  Psychiatric: She has a normal mood and affect. Her behavior is normal. Judgment and thought content normal.          Assessment & Plan:

## 2012-08-01 NOTE — Assessment & Plan Note (Signed)
Check labs See orders

## 2012-08-01 NOTE — Assessment & Plan Note (Signed)
Check labs 

## 2012-08-01 NOTE — Assessment & Plan Note (Signed)
Stable con't meds 

## 2012-08-03 LAB — POCT URINALYSIS DIPSTICK
Protein, UA: NEGATIVE
Spec Grav, UA: <=1.005
Urobilinogen, UA: 0.2

## 2012-08-04 LAB — MICROALBUMIN / CREATININE URINE RATIO
Creatinine,U: 120 mg/dL
Microalb, Ur: 0.6 mg/dL (ref 0.0–1.9)

## 2012-08-09 ENCOUNTER — Other Ambulatory Visit: Payer: Self-pay | Admitting: Family Medicine

## 2012-08-09 DIAGNOSIS — G8929 Other chronic pain: Secondary | ICD-10-CM

## 2012-08-09 MED ORDER — HYDROCODONE-ACETAMINOPHEN 7.5-750 MG PO TABS: 1.0000 | ORAL_TABLET | Freq: Four times a day (QID) | ORAL | Status: DC | PRN

## 2012-08-09 NOTE — Telephone Encounter (Signed)
Refill x1 

## 2012-08-09 NOTE — Telephone Encounter (Signed)
refill Hydrocodone-Acetaminophen (Tab) ES 7.5-750 MG Take 1 tablet by mouth every 6 (six) hours as needed. - last fill 7.11.13 Last wrt 7.11.13 #120 Last ov 8.2.13 acute visit

## 2012-08-09 NOTE — Addendum Note (Signed)
Addended by: Arnette Norris on: 08/09/2012 04:11 PM   Modules accepted: Orders

## 2012-08-09 NOTE — Telephone Encounter (Signed)
Please advise      KP 

## 2012-08-09 NOTE — Telephone Encounter (Signed)
[facility]ed [facility]     KP

## 2012-08-25 ENCOUNTER — Encounter: Payer: Medicare Other | Attending: Family Medicine | Admitting: *Deleted

## 2012-08-25 ENCOUNTER — Encounter: Payer: Self-pay | Admitting: *Deleted

## 2012-08-25 VITALS — Ht 64.0 in | Wt 197.1 lb

## 2012-08-25 DIAGNOSIS — I10 Essential (primary) hypertension: Secondary | ICD-10-CM | POA: Insufficient documentation

## 2012-08-25 DIAGNOSIS — E1149 Type 2 diabetes mellitus with other diabetic neurological complication: Secondary | ICD-10-CM | POA: Insufficient documentation

## 2012-08-25 DIAGNOSIS — Z713 Dietary counseling and surveillance: Secondary | ICD-10-CM | POA: Insufficient documentation

## 2012-08-25 DIAGNOSIS — E1142 Type 2 diabetes mellitus with diabetic polyneuropathy: Secondary | ICD-10-CM | POA: Insufficient documentation

## 2012-08-25 DIAGNOSIS — E1140 Type 2 diabetes mellitus with diabetic neuropathy, unspecified: Secondary | ICD-10-CM

## 2012-08-25 NOTE — Progress Notes (Signed)
  Medical Nutrition Therapy:  Appt start time: 1200 end time:  1300.  Assessment:  Primary concerns today: patient here for Diabetes, HTN, and obesity. 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 She lives an active life, enjoys crafts, gardening and goes to the [employer] several days a week.  MEDICATIONS: see list   DIETARY INTAKE:  Usual eating pattern includes 3 meals and 3 snacks per day.  Everyday foods include good variety of all food groups.  Avoided foods include breads, fried foods, desserts.    24-hr recall:  B ( AM): yogurt with nuts OR egg with yogurt and fruit OR oatmeal or Cheerios with yogurt, Snk ( AM): fresh fruit or nuts  L ( PM): varies each day: egg omelet with vegetables and fruit OR hot meal OR left overs from dinner Snk ( PM): fresh fruit or raw vegetables D ( PM): fish or chicken with vegetables, occasionally starch, water or green tea Snk ( PM): fruit again Beverages: water or green tea  Usual physical activity: [employer] 5 days a week for cardio as well as water aerobics  Estimated energy needs: 1400 calories 158 g carbohydrates 105 g protein 39 g fat  Progress Towards Goal(s):  In progress.   Nutritional Diagnosis:  NI-1.5 Excessive energy intake As related to activity level.  As evidenced by BMI of 33.9.    Intervention:  Nutrition counseling and diabetes education initiated. Discussed basic physiology of diabetes, SMBG and rationale of checking BG at alternate times of day, A1c, Carb Counting and reading food labels, and benefits of increased activity. Plan to continue discussions regarding diabetes and also fat grams at next visit.  Handouts given during visit include: Living Well with Diabetes Carb Counting and Food Label handouts Meal Plan Card  Monitoring/Evaluation:  Dietary intake, exercise, reading food labels, and body weight in 6 week(s).

## 2012-08-27 ENCOUNTER — Encounter: Payer: Self-pay | Admitting: *Deleted

## 2012-09-07 ENCOUNTER — Telehealth: Payer: Self-pay | Admitting: Family Medicine

## 2012-09-07 DIAGNOSIS — G8929 Other chronic pain: Secondary | ICD-10-CM

## 2012-09-07 MED ORDER — HYDROCODONE-ACETAMINOPHEN 7.5-750 MG PO TABS: 1.0000 | ORAL_TABLET | Freq: Four times a day (QID) | ORAL | Status: DC | PRN

## 2012-09-07 NOTE — Telephone Encounter (Signed)
Refill: Hydrocodone-acetaminophen 7.5-750 mg. Take 1 tablet by mouth every 6 hours as needed. Last fill 08-09-12

## 2012-09-07 NOTE — Telephone Encounter (Signed)
Refill x1 

## 2012-09-07 NOTE — Telephone Encounter (Signed)
Last seen 07/30/12 and filled 08/09/12 #120. Please advise     KP

## 2012-09-22 ENCOUNTER — Other Ambulatory Visit: Payer: Self-pay

## 2012-09-22 ENCOUNTER — Telehealth: Payer: Self-pay

## 2012-09-22 MED ORDER — PREGABALIN 75 MG PO CAPS: 75.0000 mg | ORAL_CAPSULE | Freq: Three times a day (TID) | ORAL | Status: DC

## 2012-09-22 MED ORDER — OMEPRAZOLE 20 MG PO CPDR: 20.0000 mg | DELAYED_RELEASE_CAPSULE | Freq: Every day | ORAL | Status: DC

## 2012-09-22 NOTE — Telephone Encounter (Signed)
Rx sent.    MW 

## 2012-09-23 ENCOUNTER — Other Ambulatory Visit: Payer: Self-pay

## 2012-09-23 MED ORDER — OMEPRAZOLE 20 MG PO CPDR: 20.0000 mg | DELAYED_RELEASE_CAPSULE | Freq: Every day | ORAL | Status: DC

## 2012-09-23 NOTE — Telephone Encounter (Signed)
Rx was sent yesterday but pt stated you changed the amount taken daily to 2 pills a day. She is out of medicine and pharmacy won't fill and insurance won't pay until they see prescription is changed. Plz advise         MW

## 2012-09-27 ENCOUNTER — Telehealth: Payer: Self-pay | Admitting: Family Medicine

## 2012-09-27 NOTE — Telephone Encounter (Signed)
Please advise      KP 

## 2012-09-27 NOTE — Telephone Encounter (Signed)
Tomorrow afternoon

## 2012-09-27 NOTE — Telephone Encounter (Signed)
Patient states she would like to talk to Dr. Laury Axon about her medications as well as the chills, lack of appetite, nausea, and weight gain above the waist she is experiencing. She needs 30 minutes. Where can I put her?

## 2012-09-28 ENCOUNTER — Encounter: Payer: Medicare Other | Attending: Family Medicine | Admitting: *Deleted

## 2012-09-28 ENCOUNTER — Encounter: Payer: Self-pay | Admitting: *Deleted

## 2012-09-28 VITALS — Ht 64.0 in | Wt 196.9 lb

## 2012-09-28 DIAGNOSIS — I10 Essential (primary) hypertension: Secondary | ICD-10-CM | POA: Insufficient documentation

## 2012-09-28 DIAGNOSIS — E1149 Type 2 diabetes mellitus with other diabetic neurological complication: Secondary | ICD-10-CM | POA: Insufficient documentation

## 2012-09-28 DIAGNOSIS — E1142 Type 2 diabetes mellitus with diabetic polyneuropathy: Secondary | ICD-10-CM | POA: Insufficient documentation

## 2012-09-28 DIAGNOSIS — E1140 Type 2 diabetes mellitus with diabetic neuropathy, unspecified: Secondary | ICD-10-CM

## 2012-09-28 DIAGNOSIS — Z713 Dietary counseling and surveillance: Secondary | ICD-10-CM | POA: Insufficient documentation

## 2012-09-28 NOTE — Progress Notes (Signed)
  Medical Nutrition Therapy:  Appt start time: 1500 end time:  1600.  Assessment:  Primary concerns today: patient here for Diabetes, HTN, and obesity follow up visit. Weight has been stable since last visit. She states she is SMBG 2-3 times each day and BG range is 97-139 mg/dl. She is using the 1/2 cup measuring cup as her serving utensil and is happy with that tool. She has continued with her activity level of the gym several days a week including water aerobics.   MEDICATIONS: see list   DIETARY INTAKE:  Usual eating pattern includes 3 meals and 3 snacks per day.  Everyday foods include good variety of all food groups.  Avoided foods include breads, fried foods, desserts.    24-hr recall:  B ( AM): yogurt with nuts OR egg with yogurt and fruit OR oatmeal or Cheerios with yogurt, Snk ( AM): fresh fruit or nuts  L ( PM): varies each day: egg omelet with vegetables and fruit OR hot meal OR left overs from dinner Snk ( PM): fresh fruit or raw vegetables D ( PM): fish or chicken with vegetables, occasionally starch, water or green tea Snk ( PM): fruit again Beverages: water or green tea  Usual physical activity: [employer] 5 days a week for cardio as well as water aerobics  Estimated energy needs: 1400 calories 158 g carbohydrates 105 g protein 39 g fat  Progress Towards Goal(s):  In progress.   Nutritional Diagnosis:  NI-1.5 Excessive energy intake As related to activity level.  As evidenced by BMI of 33.9.    Intervention:  Nutrition counseling and diabetes education continued. Reviewed physiology of diabetes and rationale of providing carb containing foods evenly throughout the day. Also discussed caloric value of fats and how to monitor those from food labels.   Plan: Continue with your current activity level at [facility] and the pool exercises Continue checking your BG as directed by MD 2-3 times a day Consider reading food labels for total carbohydrate and fat grams of foods  eaten Aim for 2-3 Carb choices (30-45 grams) per meal  Aim for 2-3 Fat servings per meal (10-15 grams) to help with weight loss  Monitoring/Evaluation:  Dietary intake, exercise, reading food labels, and body weight in 3 months.

## 2012-09-28 NOTE — Patient Instructions (Addendum)
Plan: Continue with your current activity level at [facility] and the pool exercises Continue checking your BG as directed by MD 2-3 times a day Consider reading food labels for total carbohydrate and fat grams of foods eaten Aim for 2-3 Carb choices (30-45 grams) per meal

## 2012-09-28 NOTE — Telephone Encounter (Signed)
Pt scheduled for the only 2 back-to-back 15 min slots this week (Friday).

## 2012-09-28 NOTE — Telephone Encounter (Signed)
Lmovm for pt to call office. She has a follow-up appt at another office this afternoon. What is another time we could see her?

## 2012-09-28 NOTE — Telephone Encounter (Signed)
Please advise      KP 

## 2012-09-28 NOTE — Telephone Encounter (Signed)
Just put 2 15 min spots together somewhere--- just not with a complicated cpe

## 2012-09-28 NOTE — Telephone Encounter (Signed)
Lmovm

## 2012-10-01 ENCOUNTER — Encounter: Payer: Self-pay | Admitting: Family Medicine

## 2012-10-01 ENCOUNTER — Ambulatory Visit (INDEPENDENT_AMBULATORY_CARE_PROVIDER_SITE_OTHER): Payer: Medicare Other | Admitting: Family Medicine

## 2012-10-01 VITALS — BP 136/62 | HR 87 | Temp 98.3°F | Wt 203.0 lb

## 2012-10-01 DIAGNOSIS — R140 Abdominal distension (gaseous): Secondary | ICD-10-CM

## 2012-10-01 DIAGNOSIS — K219 Gastro-esophageal reflux disease without esophagitis: Secondary | ICD-10-CM

## 2012-10-01 DIAGNOSIS — Z23 Encounter for immunization: Secondary | ICD-10-CM

## 2012-10-01 DIAGNOSIS — R143 Flatulence: Secondary | ICD-10-CM

## 2012-10-01 DIAGNOSIS — R141 Gas pain: Secondary | ICD-10-CM

## 2012-10-01 MED ORDER — OMEPRAZOLE 20 MG PO CPDR: 40.0000 mg | DELAYED_RELEASE_CAPSULE | Freq: Every day | ORAL | Status: DC

## 2012-10-01 NOTE — Patient Instructions (Signed)
Flatulence  Burping releases air that you swallow. The bubbles from some drinks may cause burps. There are good germs in your gut to help you digest food. Gas is produced by these germs and released from your bottom. Most people release 3 to 4 quarts of gas every day. This is normal.  HOME CARE  · Eat or drink less of the foods or liquids that give you gas.  · Take the time to chew your food well. Talk less while you eat.  · Do not suck on ice or hard candy.  · Sip slowly. Stir some of the bubbles out of fizzy drinks with a spoon or straw.  · Avoid chewing gum or smoking.  · Ask your doctor about liquids and tablets that may help control burping and gas.  · Only take medicine as told by your doctor.  GET HELP RIGHT AWAY IF:   · There is discomfort when you burp or pass gas.  · You throw up (vomit) when you burp.  · Poop (stool) comes out when you pass gas.  · Your belly is puffy (swollen) and hard.  MAKE SURE YOU:   · Understand these instructions.  · Will watch your condition.  · Will get help right away if you are not doing well or get worse.  Document Released: 10/17/2008 Document Revised: 03/08/2012 Document Reviewed: 10/17/2008  ExitCare® Patient Information ©2013 [employer].

## 2012-10-01 NOTE — Progress Notes (Signed)
  Subjective:     Stacey Leon is a 71 y.o. female who presents for evaluation of abdominal pain. Onset was 4 weeks ago. Symptoms have been stable. The pain is described as aching and sharp, and is 2/10 in intensity. Pain is located in the epigastric region and periumbilical region without radiation.  Aggravating factors: none.  Alleviating factors: none. Associated symptoms: belching, chills, flatus and nausea. The patient denies anorexia, constipation, diarrhea, dysuria, fever, frequency, headache, hematochezia, hematuria, melena, myalgias and vomiting.  The patient's history has been marked as reviewed and updated as appropriate.  Review of Systems Pertinent items are noted in HPI.     Objective:     Filed Vitals:   10/01/12 1349  BP: 136/62  Pulse: 87  Temp: 98.3 F (36.8 C)  TempSrc: Oral  Weight: 203 lb (92.08 kg)  SpO2: 96%  gen-- AAOx3  nad Cor-- S1S2  Lungs--Ctab/l  No rrw abd--- bloated,  Nt,  Soft  Skin--no rash    Assessment:    Abdominal bloating Itching---check albs .    Plan:    check labs [place] abd /pelvis rto prn

## 2012-10-03 ENCOUNTER — Encounter: Payer: Self-pay | Admitting: Family Medicine

## 2012-10-04 ENCOUNTER — Other Ambulatory Visit: Payer: Self-pay | Admitting: Family Medicine

## 2012-10-04 ENCOUNTER — Ambulatory Visit (HOSPITAL_BASED_OUTPATIENT_CLINIC_OR_DEPARTMENT_OTHER)
Admission: RE | Admit: 2012-10-04 | Discharge: 2012-10-04 | Disposition: A | Discharge status: Home / Self Care | Payer: Medicare Other | Source: Ambulatory Visit | Attending: Family Medicine | Admitting: Family Medicine

## 2012-10-04 DIAGNOSIS — R140 Abdominal distension (gaseous): Secondary | ICD-10-CM

## 2012-10-04 DIAGNOSIS — N949 Unspecified condition associated with female genital organs and menstrual cycle: Secondary | ICD-10-CM | POA: Insufficient documentation

## 2012-10-04 DIAGNOSIS — R141 Gas pain: Secondary | ICD-10-CM | POA: Insufficient documentation

## 2012-10-04 DIAGNOSIS — R142 Eructation: Secondary | ICD-10-CM | POA: Insufficient documentation

## 2012-10-04 DIAGNOSIS — R109 Unspecified abdominal pain: Secondary | ICD-10-CM | POA: Insufficient documentation

## 2012-10-04 DIAGNOSIS — N838 Other noninflammatory disorders of ovary, fallopian tube and broad ligament: Secondary | ICD-10-CM

## 2012-10-05 ENCOUNTER — Other Ambulatory Visit: Payer: Self-pay | Admitting: Family Medicine

## 2012-10-05 ENCOUNTER — Telehealth: Payer: Self-pay | Admitting: Family Medicine

## 2012-10-05 DIAGNOSIS — Z01818 Encounter for other preprocedural examination: Secondary | ICD-10-CM

## 2012-10-05 MED ORDER — ALPRAZOLAM 0.5 MG PO TABS: ORAL_TABLET | ORAL | Status: DC

## 2012-10-05 NOTE — Progress Notes (Signed)
Xanax  0.5 mg  #10  1 po before procedure and take bottle with her to procedure

## 2012-10-05 NOTE — Telephone Encounter (Signed)
I put order in.

## 2012-10-05 NOTE — Telephone Encounter (Signed)
Per Dr.Lowne Xanax .5 # 10

## 2012-10-05 NOTE — Telephone Encounter (Signed)
V72.84

## 2012-10-05 NOTE — Telephone Encounter (Signed)
Patient is scheduled for MRI Pelvis, this Saturday, 10/09/12, and her last BUN & CREAT are not recent enough per radiology.  Order for those labs need to be entered & patient come in.  Also, patient states she is claustrophobic.  She is asking for rx of 2 valium to be sent to [facility] at [address]

## 2012-10-05 NOTE — Telephone Encounter (Signed)
Please advise on Rx.      KP

## 2012-10-05 NOTE — Telephone Encounter (Signed)
whats the code for bun /cr for ct?

## 2012-10-07 ENCOUNTER — Ambulatory Visit (HOSPITAL_BASED_OUTPATIENT_CLINIC_OR_DEPARTMENT_OTHER)
Admission: RE | Admit: 2012-10-07 | Discharge: 2012-10-07 | Disposition: A | Discharge status: Home / Self Care | Payer: Medicare Other | Source: Ambulatory Visit | Attending: Family Medicine | Admitting: Family Medicine

## 2012-10-07 DIAGNOSIS — D1803 Hemangioma of intra-abdominal structures: Secondary | ICD-10-CM | POA: Insufficient documentation

## 2012-10-07 DIAGNOSIS — R109 Unspecified abdominal pain: Secondary | ICD-10-CM | POA: Insufficient documentation

## 2012-10-07 DIAGNOSIS — R140 Abdominal distension (gaseous): Secondary | ICD-10-CM

## 2012-10-08 ENCOUNTER — Other Ambulatory Visit (INDEPENDENT_AMBULATORY_CARE_PROVIDER_SITE_OTHER): Payer: Medicare Other

## 2012-10-08 DIAGNOSIS — Z01818 Encounter for other preprocedural examination: Secondary | ICD-10-CM

## 2012-10-08 LAB — BASIC METABOLIC PANEL
BUN: 13 mg/dL (ref 6–23)
CO2: 32 mEq/L (ref 19–32)
Calcium: 8.9 mg/dL (ref 8.4–10.5)
Chloride: 97 mEq/L (ref 96–112)
Creatinine, Ser: 0.8 mg/dL (ref 0.4–1.2)
Glucose, Bld: 99 mg/dL (ref 70–99)

## 2012-10-09 ENCOUNTER — Ambulatory Visit (HOSPITAL_BASED_OUTPATIENT_CLINIC_OR_DEPARTMENT_OTHER)
Admission: RE | Admit: 2012-10-09 | Discharge: 2012-10-09 | Disposition: A | Discharge status: Home / Self Care | Payer: Medicare Other | Source: Ambulatory Visit | Attending: Family Medicine | Admitting: Family Medicine

## 2012-10-09 DIAGNOSIS — N838 Other noninflammatory disorders of ovary, fallopian tube and broad ligament: Secondary | ICD-10-CM

## 2012-10-09 DIAGNOSIS — N83209 Unspecified ovarian cyst, unspecified side: Secondary | ICD-10-CM | POA: Insufficient documentation

## 2012-10-09 MED ORDER — GADOBENATE DIMEGLUMINE 529 MG/ML IV SOLN
20.0000 mL | Freq: Once | INTRAVENOUS | Status: AC | PRN
  Administered 2012-10-09: 20 mL via INTRAVENOUS

## 2012-10-11 ENCOUNTER — Other Ambulatory Visit: Payer: Self-pay | Admitting: Family Medicine

## 2012-10-11 ENCOUNTER — Telehealth: Payer: Self-pay | Admitting: Family Medicine

## 2012-10-11 DIAGNOSIS — R140 Abdominal distension (gaseous): Secondary | ICD-10-CM

## 2012-10-11 DIAGNOSIS — Z0000 Encounter for general adult medical examination without abnormal findings: Secondary | ICD-10-CM

## 2012-10-11 DIAGNOSIS — R9389 Abnormal findings on diagnostic imaging of other specified body structures: Secondary | ICD-10-CM

## 2012-10-11 DIAGNOSIS — G8929 Other chronic pain: Secondary | ICD-10-CM

## 2012-10-11 MED ORDER — HYDROCODONE-ACETAMINOPHEN 7.5-750 MG PO TABS: 1.0000 | ORAL_TABLET | Freq: Four times a day (QID) | ORAL | Status: DC | PRN

## 2012-10-11 MED ORDER — ZOSTER VACCINE LIVE 19400 UNT/0.65ML ~~LOC~~ SOLR: 0.6500 mL | Freq: Once | SUBCUTANEOUS | Status: DC

## 2012-10-11 NOTE — Telephone Encounter (Signed)
Refill done.  

## 2012-10-11 NOTE — Telephone Encounter (Signed)
Yes

## 2012-10-11 NOTE — Telephone Encounter (Signed)
Ok to refill 

## 2012-10-11 NOTE — Telephone Encounter (Signed)
Patient requests refill on hydrocodone 7.5-750 mg. Fax to [facility] on [address]

## 2012-10-13 ENCOUNTER — Encounter: Payer: Self-pay | Admitting: *Deleted

## 2012-10-19 ENCOUNTER — Telehealth: Payer: Self-pay | Admitting: Obstetrics and Gynecology

## 2012-10-19 NOTE — Telephone Encounter (Signed)
Pt will see Dr. Stefano Gaul on 10-21-2012.  Pt agreeable.  [facility] CMA

## 2012-10-21 ENCOUNTER — Ambulatory Visit (INDEPENDENT_AMBULATORY_CARE_PROVIDER_SITE_OTHER): Payer: Medicare Other | Admitting: Obstetrics and Gynecology

## 2012-10-21 ENCOUNTER — Encounter: Payer: Self-pay | Admitting: Obstetrics and Gynecology

## 2012-10-21 VITALS — BP 140/80 | Temp 98.2°F | Wt 203.0 lb

## 2012-10-21 DIAGNOSIS — N949 Unspecified condition associated with female genital organs and menstrual cycle: Secondary | ICD-10-CM

## 2012-10-21 DIAGNOSIS — Z1273 Encounter for screening for malignant neoplasm of ovary: Secondary | ICD-10-CM

## 2012-10-21 DIAGNOSIS — R102 Pelvic and perineal pain unspecified side: Secondary | ICD-10-CM

## 2012-10-21 DIAGNOSIS — R140 Abdominal distension (gaseous): Secondary | ICD-10-CM

## 2012-10-21 DIAGNOSIS — R142 Eructation: Secondary | ICD-10-CM

## 2012-10-21 DIAGNOSIS — E119 Type 2 diabetes mellitus without complications: Secondary | ICD-10-CM

## 2012-10-21 DIAGNOSIS — N83299 Other ovarian cyst, unspecified side: Secondary | ICD-10-CM

## 2012-10-21 DIAGNOSIS — Z8543 Personal history of malignant neoplasm of ovary: Secondary | ICD-10-CM

## 2012-10-21 DIAGNOSIS — Z719 Counseling, unspecified: Secondary | ICD-10-CM

## 2012-10-21 DIAGNOSIS — N83209 Unspecified ovarian cyst, unspecified side: Secondary | ICD-10-CM

## 2012-10-21 DIAGNOSIS — K3184 Gastroparesis: Secondary | ICD-10-CM

## 2012-10-21 NOTE — Progress Notes (Addendum)
HISTORY OF PRESENT ILLNESS  Ms. Stacey Leon is a 71 y.o. year old female,G1P1, who presents for a problem visit. The patient has had a hysterectomy and a right salpingo-oophorectomy.  She has a history of pelvic pain, and back pain.  She is diabetic.  She has a history of gastroparesis.  She has a history of depression and possible anxiety.  She has been evaluated by gastroenterologist in the past.  She was seen by her primary physician for an increase in her pelvic pain, left hip pain, and left upper quadrant pain.  An ultrasound and an MRI were obtained.  She was found to have a 1.3 cm simple cyst on her left ovary.  In addition, there is a 1 cm calcification adjacent to the left ovary that may involve the left ovary.  There is no fat in the lesion.  Subjective:  The patient reports severe left upper quadrant pain with bloating.  She complains of pelvic pain, low back pain, and left hip pain.  She rates her pain as 9/10.  Bowel movements sometimes makes the pain worse.  She has incomplete emptying of her bladder.  OB History    Grav Para Term Preterm Abortions TAB SAB Ect Mult Living   1 1        1       Past Medical History  Diagnosis Date  . Diabetes mellitus   . Hyperlipemia   . Osteopenia   . Peripheral neuropathy   . GERD (gastroesophageal reflux disease)   . Iron deficiency anemia   . Hypertension   . Asthma    Past Surgical History  Procedure Date  . Vaginal hysterectomy     Allergies  Allergen Reactions  . Ampicillin Hives and Itching  . Simvastatin     Family History: family history includes Colon cancer in her maternal uncle and paternal uncle.  Social History:  reports that she has never smoked. She has never used smokeless tobacco. She reports that she does not drink alcohol or use illicit drugs.  Objective:  BP 140/80  Temp 98.2 F (36.8 C) (Oral)  Wt 203 lb (92.08 kg)   General: no distress Resp: clear to auscultation bilaterally Cardio: regular rate  and rhythm, S1, S2 normal, no murmur, click, rub or gallop GI: soft, non-tender; bowel sounds normal; no masses,  no organomegaly back: No CVA tenderness.  External genitalia: normal general appearance Vaginal: atrophic mucosa Cervix: absent Adnexa: normal bimanual exam Uterus: absent Rectal: good sphincter tone, no masses and guaiac negative  Assessment:  1.3 cm simple left ovarian cyst. 1 cm calcification of the left ovary or adjacent to the left ovary. Pelvic pain that the patient rates 9/10 although she seems to be in no distress in the office today. Abdominal bloating. Diabetes. Gastroparesis. Depression and possible anxiety. Status post hysterectomy and right salpingo-oophorectomy.  Plan:  45 min. Visit with greater than 50% being face-to-face with the patient. The patient was told though I do not think that her ovarian cyst has anything to do with her upper quadrant pain, left hip pain, or back pain. Scar tissue may cause some of her discomfort.  The dangers of multiple surgeries to correct scar tissue were reviewed. Refer the patient back to her gastroenterologist. Check a CA 125. The patient will return to see Stacey Leon (her primary gynecologist) to discuss her test results and review possible management options.  Return to office in 2 week(s).  Leonard Schwartz M.D.  10/21/2012 1:18 PM  Date pain started: In August Pain scale: 9/10 Imaging: abdominal and pelvic u/s, MRI pelvis Nausea,Vomiting,Fever,chills: yes UTI SX: none H/O kidney stones: no What makes pain better/worse: Rx relieves some of the pain, lifting and moving makes it worse. Sexually active: no Pt c/o bloating, abdominal pain, and left leg pain.

## 2012-10-25 ENCOUNTER — Encounter: Payer: Self-pay | Admitting: Obstetrics and Gynecology

## 2012-10-25 ENCOUNTER — Ambulatory Visit (INDEPENDENT_AMBULATORY_CARE_PROVIDER_SITE_OTHER): Payer: Medicare Other | Admitting: Family Medicine

## 2012-10-25 ENCOUNTER — Ambulatory Visit: Payer: Medicare Other

## 2012-10-25 ENCOUNTER — Ambulatory Visit (INDEPENDENT_AMBULATORY_CARE_PROVIDER_SITE_OTHER): Payer: Medicare Other | Admitting: Obstetrics and Gynecology

## 2012-10-25 VITALS — BP 138/74 | Temp 98.4°F | Wt 206.0 lb

## 2012-10-25 DIAGNOSIS — N83209 Unspecified ovarian cyst, unspecified side: Secondary | ICD-10-CM

## 2012-10-25 DIAGNOSIS — L0390 Cellulitis, unspecified: Secondary | ICD-10-CM

## 2012-10-25 DIAGNOSIS — T300 Burn of unspecified body region, unspecified degree: Secondary | ICD-10-CM

## 2012-10-25 DIAGNOSIS — N949 Unspecified condition associated with female genital organs and menstrual cycle: Secondary | ICD-10-CM

## 2012-10-25 DIAGNOSIS — T8062XA Other serum reaction due to vaccination, initial encounter: Secondary | ICD-10-CM

## 2012-10-25 DIAGNOSIS — T50Z95A Adverse effect of other vaccines and biological substances, initial encounter: Secondary | ICD-10-CM

## 2012-10-25 DIAGNOSIS — L0291 Cutaneous abscess, unspecified: Secondary | ICD-10-CM

## 2012-10-25 DIAGNOSIS — Z719 Counseling, unspecified: Secondary | ICD-10-CM

## 2012-10-25 DIAGNOSIS — R102 Pelvic and perineal pain: Secondary | ICD-10-CM

## 2012-10-25 MED ORDER — ERYTHROMYCIN BASE 500 MG PO TABS: 500.0000 mg | ORAL_TABLET | Freq: Two times a day (BID) | ORAL | Status: DC

## 2012-10-25 MED ORDER — SILVER SULFADIAZINE 1 % EX CREA: TOPICAL_CREAM | Freq: Two times a day (BID) | CUTANEOUS | Status: DC

## 2012-10-25 NOTE — Assessment & Plan Note (Signed)
Silvadene Erythromycin   benadryl rto prn

## 2012-10-25 NOTE — Progress Notes (Signed)
  Subjective:    Patient ID: Stacey Leon, female    DOB: 11-Nov-1941, 71 y.o.   MRN: [id number]  HPI Pt walked in c/o reaction to shingles vaccine.  She went to  Pharmacy and they sent her here.    Review of Systems    as above Objective:   Physical Exam  Skin:       + blistering and errythema R arm at site of injection Hot to touch          Assessment & Plan:

## 2012-10-25 NOTE — Progress Notes (Signed)
F/u from 10/21/12.  Pt still c/o pain.  She states hydrocodone does help.  She feels her abdomen is growing and she thinks she has an abdominal mass.  She is frustrated because she is dieting and exercising with no weight loss BP 138/74  Temp 98.4 F (36.9 C) (Oral)  Wt 206 lb (93.441 kg) Physical Examination: General appearance - alert, well appearing, and in no distress Heart - normal rate and regular rhythm Abdomen - soft, nontender, nondistended, no masses or organomegaly Pelvic - VULVA: normal appearing vulva with no masses, tenderness or lesions, VAGINA: normal appearing vagina with normal color and discharge, no lesions, atrophic, CERVIX: normal appearing cervix without discharge or lesions, surgically absent, UTERUS: surgically absent, vaginal cuff well healed, ADNEXA: normal adnexa in size, nontender and no masses, surgically absent right Abdominal pain and pelvic pain Left ovarian cyst Left pelvic calcification Pt with normal CA 125 Pt given the following options 1.  Repeat US in 3 weeks to reevaluate the cyst 2.  Refer to Gi for evaluation of pain 3.  Refer to GYN /ONC for a second opinion 4.  Removal of the left ovary Pt chose #3.  Will f/u after consultation

## 2012-10-25 NOTE — Patient Instructions (Signed)
Pelvic Pain Pelvic pain is pain below the belly button and located between your hips. Acute pain may last a few hours or days. Chronic pelvic pain may last weeks and months. The cause may be different for different types of pain. The pain may be dull or sharp, mild or severe and can interfere with your daily activities. Write down and tell your caregiver:   Exactly where the pain is located.  If it comes and goes or is there all the time.  When it happens (with sex, urination, bowel movement, etc.)  If the pain is related to your menstrual period or stress. Your caregiver will take a full history and do a complete physical exam and Pap test. CAUSES   Painful menstrual periods (dysmenorrhea).  Normal ovulation (Mittelschmertz) that occurs in the middle of the menstrual cycle every month.  The pelvic organs get engorged with blood just before the menstrual period (pelvic congestive syndrome).  Scar tissue from an infection or past surgery (pelvic adhesions).  Cancer of the female pelvic organs. When there is pain with cancer, it has been there for a long time.  The lining of the uterus (endometrium) abnormally grows in places like the pelvis and on the pelvic organs (endometriosis).  A form of endometriosis with the lining of the uterus present inside of the muscle tissue of the uterus (adenomyosis).  Fibroid tumor (noncancerous) in the uterus.  Bladder problems such as infection, bladder spasms of the muscle tissue of the bladder.  Intestinal problems (irritable bowel syndrome, colitis, an ulcer or gastrointestinal infection).  Polyps of the cervix or uterus.  Pregnancy in the tube (ectopic pregnancy).  The opening of the cervix is too small for the menstrual blood to flow through it (cervical stenosis).  Physical or sexual abuse (past or present).  Musculo-skeletal problems from poor posture, problems with the vertebrae of the lower back or the uterine pelvic muscles falling  (prolapse).  Psychological problems such as depression or stress.  IUD (intrauterine device) in the uterus. DIAGNOSIS  Tests to make a diagnosis depends on the type, location, severity and what causes the pain to occur. Tests that may be needed include:  Blood tests.  Urine tests  Ultrasound.  X-rays.  CT Scan.  MRI.  Laparoscopy.  Major surgery. TREATMENT  Treatment will depend on the cause of the pain, which includes:  Prescription or over-the-counter pain medication.  Antibiotics.  Birth control pills.  Hormone treatment.  Nerve blocking injections.  Physical therapy.  Antidepressants.  Counseling with a psychiatrist or psychologist.  Minor or major surgery. HOME CARE INSTRUCTIONS   Only take over-the-counter or prescription medicines for pain, discomfort or fever as directed by your caregiver.  Follow your caregiver's advice to treat your pain.  Rest.  Avoid sexual intercourse if it causes the pain.  Apply warm or cold compresses (which ever works best) to the pain area.  Do relaxation exercises such as yoga or meditation.  Try acupuncture.  Avoid stressful situations.  Try group therapy.  If the pain is because of a stomach/intestinal upset, drink clear liquids, eat a bland light food diet until the symptoms go away. SEEK MEDICAL CARE IF:   You need stronger prescription pain medication.  You develop pain with sexual intercourse.  You have pain with urination.  You develop a temperature of 102 F (38.9 C) with the pain.  You are still in pain after 4 hours of taking prescription medication for the pain.  You need depression medication.    Your IUD is causing pain and you want it removed. SEEK IMMEDIATE MEDICAL CARE IF:  You develop very severe pain or tenderness.  You faint, have chills, severe weakness or dehydration.  You develop heavy vaginal bleeding or passing solid tissue.  You develop a temperature of 102 F (38.9 C)  with the pain.  You have blood in the urine.  You are being physically or sexually abused.  You have uncontrolled vomiting and diarrhea.  You are depressed and afraid of harming yourself or someone else. Document Released: 01/22/2005 Document Revised: 03/08/2012 Document Reviewed: 10/19/2008 ExitCare Patient Information 2013 [employer].  

## 2012-10-26 ENCOUNTER — Telehealth: Payer: Self-pay

## 2012-10-26 NOTE — Telephone Encounter (Signed)
Refer to GYN/ONC pt has appt 10/28/12 at 2:15 lm on vm tcb rgd appt

## 2012-10-26 NOTE — Telephone Encounter (Signed)
Message copied by Rolla Plate on Tue Oct 26, 2012  1:43 PM ------      Message from: Jaymes Graff      Created: Mon Oct 25, 2012  3:53 PM       Refer to GYN/ONC pt  Postmenopausal with ovarian cyst.  And calcification

## 2012-10-26 NOTE — Telephone Encounter (Signed)
Spoke with pt informed appt date and time pt voice understanding

## 2012-10-27 ENCOUNTER — Other Ambulatory Visit: Payer: Self-pay | Admitting: Family Medicine

## 2012-10-28 ENCOUNTER — Ambulatory Visit: Payer: Medicare Other | Attending: Gynecologic Oncology | Admitting: Gynecologic Oncology

## 2012-10-28 ENCOUNTER — Encounter: Payer: Self-pay | Admitting: Gynecologic Oncology

## 2012-10-28 VITALS — BP 164/76 | HR 100 | Temp 98.6°F | Resp 20 | Ht 64.41 in | Wt 202.2 lb

## 2012-10-28 DIAGNOSIS — K219 Gastro-esophageal reflux disease without esophagitis: Secondary | ICD-10-CM | POA: Insufficient documentation

## 2012-10-28 DIAGNOSIS — I10 Essential (primary) hypertension: Secondary | ICD-10-CM | POA: Insufficient documentation

## 2012-10-28 DIAGNOSIS — N83209 Unspecified ovarian cyst, unspecified side: Secondary | ICD-10-CM | POA: Insufficient documentation

## 2012-10-28 DIAGNOSIS — Z79899 Other long term (current) drug therapy: Secondary | ICD-10-CM | POA: Insufficient documentation

## 2012-10-28 DIAGNOSIS — J45909 Unspecified asthma, uncomplicated: Secondary | ICD-10-CM | POA: Insufficient documentation

## 2012-10-28 DIAGNOSIS — E119 Type 2 diabetes mellitus without complications: Secondary | ICD-10-CM | POA: Insufficient documentation

## 2012-10-28 DIAGNOSIS — E785 Hyperlipidemia, unspecified: Secondary | ICD-10-CM | POA: Insufficient documentation

## 2012-10-28 NOTE — Patient Instructions (Signed)
Follow up with your other physicians and return to clinic prn.

## 2012-10-28 NOTE — Progress Notes (Signed)
Consult Note: Gyn-Onc  Stacey Leon 71 y.o. female  CC:  Chief Complaint  Patient presents with  . Ovarian Cyst    New pt    HPI: Patient is seen today in consultation at the request of Dr. Normand Sloop for evaluation of abdominal pain and an ovarian cyst.  Patient is a 71 year old gravida 1 para 1 who was seen by Dr. Normand Sloop August 28 of year 2013. She is a history of undergoing hysterectomy and right salpingo-oophorectomy in the distant past. She is a long-standing history of pelvic pain, back pain, abdominal pain and gastroparesis. In evaluation for this pain she underwent a transvaginal and transabdominal ultrasound on August 8. It reveals that the uterus is surgically absent. Right ovary surgically absent. The left ovary which in total measures 3.2 x 2.0 x 2.3 cm is approximately 2 cm hypoechoic area with shadowing and less than 1 cm cystic focus is noted. It could represent calcification or bowel gas. She underwent a pelvic MRI for further evaluation of this on a 2/12. It reveals left ovary containing a 1.3 cm cyst. There's also possible calcification the left ovary measuring 1 cm that is separate from the cyst. There is no fat or other masses we mass lesions noted in the left ovary. The sigmoid colon passes immediately adjacent to the left ovary but there is no evidence of adnexal masses. There is no pelvic adenopathy, no evidence of inflammatory processes, abdominal fluid collections. She also had an abdominal ultrasound that revealed no stones gallbladder thickening or fluid near the gallbladder. She is suspected irregular cyst adjacent to the gallbladder fossa and known hemangiomas but not discretely visualized. The IVC appeared normal. The pancreas and spleen were poorly visualized secondary to overlying bowel gas the kidneys are normal. There is no comment of any fluid or masses in the upper abdomen was otherwise negative. She did have a CA 125 that was normal at 12.8. When she was last seen  by Dr. Normand Sloop and was recommended that they can evaluate with an ultrasound in 3 weeks, referred to gastroenterology for evaluation of pain, referred to [place] for second opinion or consider removal of left ovary. The patient opted for second opinion with GYN oncology.  Interval History:   Review of Systems: Patient complains of were all of her pain with eating. The pain is worse without her narcotics. She primarily takes narcotics for hip and back pain and she feels that when it is time to take another pain pill she has or abdominal pain. She describes it as diffuse. She was going to the gym for 6 months and notice that she wasn't losing weight. She then states that she lost about 10 pounds but noticed that her abdomen was getting bigger. She states that since she stopped working out you know she didn't change her diet she gained 10 pounds back over the past one to 2 months. Starting in August of 2013 she noticed that her abdomen had a quarter "growth" she began having pain under her left breast. She also notes that she has urinary frequency and voids about 1-2 every 1-2 hours. She does drink about 8-10 glasses of water per day. She states that there is a deviation in her urine stream that is different than it was before. She states that for the past year she's been drinking 8-10 glasses of water per day and that the urinary frequency is a new issue for her. She also wakes up about 5-6 times per night to avoid  a very efficient as always stop drinking water at the same time per evening. She denies any change in her bowel habits. She does complain of nausea. Nothing makes it better nothing makes it worse. There is no emesis associated with it. She was started on dicyclomine by Dr. Shellia Cleverly in gastroenterology. She also has noted some fevers at home with a high as 100.9. She has not seen anybody for this and did not discuss that with Dr. Laury Axon her primary physician. She states she's had vaginal discharge for "a long  time". She does eat yogurt every day and this is the 1 on for the past 3-4 years. Her average Accu-Cheks were 125 to as high as 140-150. Her last A1c was 6.1. She stay she's up-to-date on her colonoscopy having had one in 2010  Current Meds:  Outpatient Encounter Prescriptions as of 10/28/2012  Medication Sig Dispense Refill  . ALPRAZolam (XANAX) 0.5 MG tablet 1 tab by mouth 30 minutes prior to procedure and take the bottle to the procedure  10 tablet  0  . amLODipine (NORVASC) 5 MG tablet Take 1 tablet (5 mg total) by mouth daily.  90 tablet  3  . aspirin 81 MG tablet Take 81 mg by mouth daily.        . Azelastine-Fluticasone (DYMISTA) 137-50 MCG/ACT SUSP Place 1 spray into the nose 2 (two) times daily.      . beclomethasone (QVAR) 80 MCG/ACT inhaler Inhale 1 puff into the lungs as needed.        . Blood Glucose Monitoring Suppl (ONE TOUCH ULTRA SYSTEM KIT) W/DEVICE KIT 1 kit by Does not apply route once.  1 each  11  . Calcium Carbonate (CALTRATE 600) 1500 MG TABS Take by mouth. 1 po qd       . Casanthranol-Docusate Sodium 30-100 MG CAPS Take by mouth. 3 caps by mouth daily   Stool softner       . cetirizine (ZYRTEC) 10 MG tablet Take 10 mg by mouth daily.        Marland Kitchen co-enzyme Q-10 30 MG capsule Take 30 mg by mouth 1 dose over 46 hours.        Marland Kitchen dicyclomine (BENTYL) 10 MG capsule Take 1 capsule (10 mg total) by mouth 4 (four) times daily -  before meals and at bedtime.  360 capsule  1  . ergocalciferol (VITAMIN D2) 50000 UNITS capsule Take 1 capsule (50,000 Units total) by mouth once a week.  4 capsule  12  . erythromycin base (E-MYCIN) 500 MG tablet Take 1 tablet (500 mg total) by mouth 2 (two) times daily.  20 tablet  0  . estradiol (ESTRACE) 1 MG tablet Take 1 tablet (1 mg total) by mouth daily.  90 tablet  3  . FeAsp-FeFum -Suc-C-Thre-B12-FA (MULTIGEN PLUS PO) Take by mouth 1 dose over 46 hours.        . FeAsp-FeFum -Suc-C-Thre-B12-FA (MULTIGEN PLUS) 50-101-1 MG TABS 1 po qd  30 each  11  .  fluocinonide cream (LIDEX) 0.05 % Apply 1 application topically 2 (two) times daily.  30 g  5  . HYDROcodone-acetaminophen (VICODIN ES) 7.5-750 MG per tablet Take 1 tablet by mouth every 6 (six) hours as needed.  120 tablet  0  . Inulin (FIBERCHOICE PO) Take by mouth. 2 tablets po daily       . olopatadine (PATANOL) 0.1 % ophthalmic solution Place 1 drop into both eyes 1 day or 1 dose.  5 mL  3  .  omeprazole (PRILOSEC) 20 MG capsule Take 2 capsules (40 mg total) by mouth daily.  180 capsule  3  . pirbuterol (MAXAIR) 200 MCG/INH inhaler Inhale 2 puffs into the lungs 4 (four) times daily.        . pregabalin (LYRICA) 75 MG capsule Take 1 capsule (75 mg total) by mouth 3 (three) times daily.  270 capsule  3  . quinapril (ACCUPRIL) 40 MG tablet Take 1 tablet (40 mg total) by mouth at bedtime.  90 tablet  1  . rosuvastatin (CRESTOR) 5 MG tablet 1 tablet by mouth at bedtime--labs are due now  90 tablet  0  . silver sulfADIAZINE (SILVADENE) 1 % cream Apply topically 2 (two) times daily.  50 g  0  . sitaGLIPtin (JANUVIA) 100 MG tablet Take 1 tablet (100 mg total) by mouth daily.  90 tablet  3  . sitaGLIPtin (JANUVIA) 100 MG tablet Take 100 mg by mouth daily.      Marland Kitchen torsemide (DEMADEX) 20 MG tablet 1/2 tab po qd  45 tablet  1  . vitamin E 200 UNIT capsule Take 200 Units by mouth daily.        Marland Kitchen zoster vaccine live, PF, (ZOSTAVAX) 95284 UNT/0.65ML injection Inject 19,400 Units into the skin once.  1 vial  0    Allergy:  Allergies  Allergen Reactions  . Ampicillin Hives and Itching  . Simvastatin     Social Hx:   History   Social History  . Marital Status: Divorced    Spouse Name: N/A    Number of Children: N/A  . Years of Education: N/A   Occupational History  . Retired     Social History Main Topics  . Smoking status: Never Smoker   . Smokeless tobacco: Never Used  . Alcohol Use: No  . Drug Use: No  . Sexually Active: No     partial hysterectomy   Other Topics Concern  . Not on  file   Social History Narrative  . No narrative on file    Past Surgical Hx:  Past Surgical History  Procedure Date  . Vaginal hysterectomy     Past Medical Hx:  Past Medical History  Diagnosis Date  . Diabetes mellitus   . Hyperlipemia   . Osteopenia   . Peripheral neuropathy   . GERD (gastroesophageal reflux disease)   . Iron deficiency anemia   . Hypertension   . Asthma     Family Hx:  Family History  Problem Relation Age of Onset  . Colon cancer Maternal Uncle   . Colon cancer Paternal Uncle     Vitals:  Blood pressure 164/76, pulse 100, temperature 98.6 F (37 C), temperature source Oral, resp. rate 20, height 5' 4.41" (1.636 m), weight 202 lb 3.2 oz (91.717 kg).  Physical Exam:  Well-nourished well-developed female in no acute distress.   Neck: Supple, no lymphadenopathy no thyromegaly.  Lungs: Clear to auscultation bilaterally.  Cardiovascular: Regular rate and rhythm with a 1/6 systolic ejection murmur.  Abdomen: Soft, nontender, nondistended. There is no palpable masses or hepatosplenomegaly. There is no fluid wave. There is no rebound or guarding. Exam is somewhat limited by habitus.  Groins: Chafing status post shaving. There is no lymphadenopathy. There is slight tenderness to palpation of the left groin.  Extremities: No edema.  Pelvic: No masses or nodularity. Rectal confirms.  Assessment/Plan: 71 year old with a diffuse abdominal pain who is referred to me secondary to a 1.3 cm cystic area noted on  her left ovary and a normal CA 125. As with Dr. Normand Sloop I do not believe that the cyst is the cause of her pain. The patient states some discomfort with the fact that we are all be basing so much of our diagnostic decisions on scan results and radiology results. I discussed with her that that is really what we have as those are the only non invasive tests available to [place]. However I told her I would feel reassured that there is been consistency amongst  the scans despite different modes of imaging including ultrasound and MRI. I discussed with her that if she has left ovary removed I cannot guarantee her that this would alleviate her pain symptoms as her pain is probably multifactorial. I discussed with her that I would probably favor having her see Dr. Arlyce Dice for her known gastroparesis to see if there's any other intervention that can be taken with that. She knows that we'll send a copy of today's note to her other physicians. We'll be happy to see her in the future should the need arise.  Cosimo Schertzer A., MD 10/28/2012, 2:56 PM

## 2012-10-29 ENCOUNTER — Encounter: Payer: Self-pay | Admitting: Gastroenterology

## 2012-11-03 ENCOUNTER — Telehealth: Payer: Self-pay | Admitting: Gastroenterology

## 2012-11-03 NOTE — Telephone Encounter (Signed)
Patient states she is having abdominal pain above and below belly button, bloating and gas. States she feels full all the time. She is taking her Dicyclomine as ordered. She states this is the same symptoms she usually has not any worse. Offered OV with extender on Friday. She will just keep her appointment with Dr.Kaplan on Monday.

## 2012-11-08 ENCOUNTER — Encounter: Payer: Self-pay | Admitting: Gastroenterology

## 2012-11-08 ENCOUNTER — Ambulatory Visit (INDEPENDENT_AMBULATORY_CARE_PROVIDER_SITE_OTHER): Payer: Medicare Other | Admitting: Gastroenterology

## 2012-11-08 ENCOUNTER — Other Ambulatory Visit (INDEPENDENT_AMBULATORY_CARE_PROVIDER_SITE_OTHER): Payer: Medicare Other

## 2012-11-08 VITALS — BP 150/78 | HR 92 | Ht 64.0 in | Wt 204.6 lb

## 2012-11-08 DIAGNOSIS — R109 Unspecified abdominal pain: Secondary | ICD-10-CM

## 2012-11-08 DIAGNOSIS — R140 Abdominal distension (gaseous): Secondary | ICD-10-CM

## 2012-11-08 DIAGNOSIS — R5383 Other fatigue: Secondary | ICD-10-CM

## 2012-11-08 DIAGNOSIS — K3189 Other diseases of stomach and duodenum: Secondary | ICD-10-CM

## 2012-11-08 DIAGNOSIS — R1013 Epigastric pain: Secondary | ICD-10-CM

## 2012-11-08 DIAGNOSIS — K3184 Gastroparesis: Secondary | ICD-10-CM

## 2012-11-08 DIAGNOSIS — R141 Gas pain: Secondary | ICD-10-CM

## 2012-11-08 DIAGNOSIS — R5381 Other malaise: Secondary | ICD-10-CM

## 2012-11-08 LAB — T4: T4, Total: 10.8 ug/dL (ref 5.0–12.5)

## 2012-11-08 NOTE — Assessment & Plan Note (Addendum)
Patient's symptoms are nonspecific and likely due to dyspepsia. Workup in the past for similar problems including upper endoscopy, gastric emptying scan and ultrasound have been negative. Recent addition of erythromycin may be exacerbating symptoms of nausea and pain. She clearly has not responded to dicyclomine. There may also be a functional component. Patient states that stress exacerbates symptoms.  Recommendations #1 trial of amitiza 8 micrograms twice a day #2 D/C dicyclomine; begin hyomax 0.375 mg twice a day when necessary; patient was instructed to use Vicodin only if abdominal pain becomes severe #3 patient instructed to call back in 3-4 days to report progress; if not improved will obtain a CT of the abdomen and pelvis #4 check thyroid function tests

## 2012-11-08 NOTE — Progress Notes (Signed)
History of Present Illness: Stacey Leon has returned complaining of abdominal bloating, nausea and pain. She tends to develop immediate postprandial nausea and bloating. Despite exercising she feels that her abdominal girth is increasing. She may have intermittent sharp pain in the right upper and lower quadrants. Recent abdominal ultrasound was negative except for an ovarian cyst. She was started on erythromycin on October 28. Symptoms preceded this medication.  Endoscopy in February, 2011 for similar symptoms was negative. It is clearly worsened postprandially. Symptoms are not affected by moving her bowels.  Gastric emptying scan November, 2011 was normal.  Patient claims that stress exacerbates her symptoms.  She continues on dicyclomine 4 times a day.    Past Medical History  Diagnosis Date  . Diabetes mellitus   . Hyperlipemia   . Osteopenia   . Peripheral neuropathy   . GERD (gastroesophageal reflux disease)   . Iron deficiency anemia   . Hypertension   . Asthma    Past Surgical History  Procedure Date  . Vaginal hysterectomy    family history includes Colon cancer in her maternal uncle and paternal uncle. Current Outpatient Prescriptions  Medication Sig Dispense Refill  . amLODipine (NORVASC) 5 MG tablet Take 1 tablet (5 mg total) by mouth daily.  90 tablet  3  . aspirin 81 MG tablet Take 81 mg by mouth daily.        . Azelastine-Fluticasone (DYMISTA) 137-50 MCG/ACT SUSP Place 1 spray into the nose 2 (two) times daily.      . beclomethasone (QVAR) 80 MCG/ACT inhaler Inhale 1 puff into the lungs as needed.        . Blood Glucose Monitoring Suppl (ONE TOUCH ULTRA SYSTEM KIT) W/DEVICE KIT 1 kit by Does not apply route once.  1 each  11  . Calcium Carbonate (CALTRATE 600) 1500 MG TABS Take by mouth. 1 po qd       . Casanthranol-Docusate Sodium 30-100 MG CAPS Take by mouth. 3 caps by mouth daily   Stool softner       . cetirizine (ZYRTEC) 10 MG tablet Take 10 mg by mouth daily.          Marland Kitchen co-enzyme Q-10 30 MG capsule Take 30 mg by mouth 1 dose over 46 hours.        Marland Kitchen dicyclomine (BENTYL) 10 MG capsule Take 1 capsule (10 mg total) by mouth 4 (four) times daily -  before meals and at bedtime.  360 capsule  1  . ergocalciferol (VITAMIN D2) 50000 UNITS capsule Take 1 capsule (50,000 Units total) by mouth once a week.  4 capsule  12  . erythromycin base (E-MYCIN) 500 MG tablet Take 1 tablet (500 mg total) by mouth 2 (two) times daily.  20 tablet  0  . estradiol (ESTRACE) 1 MG tablet Take 1 tablet (1 mg total) by mouth daily.  90 tablet  3  . FeAsp-FeFum -Suc-C-Thre-B12-FA (MULTIGEN PLUS PO) Take by mouth 1 dose over 46 hours.        . FeAsp-FeFum -Suc-C-Thre-B12-FA (MULTIGEN PLUS) 50-101-1 MG TABS 1 po qd  30 each  11  . fluocinonide cream (LIDEX) 0.05 % Apply 1 application topically 2 (two) times daily.  30 g  5  . HYDROcodone-acetaminophen (VICODIN ES) 7.5-750 MG per tablet Take 1 tablet by mouth every 6 (six) hours as needed.  120 tablet  0  . Inulin (FIBERCHOICE PO) Take by mouth. 2 tablets po daily       . olopatadine (PATANOL)  0.1 % ophthalmic solution Place 1 drop into both eyes 1 day or 1 dose.  5 mL  3  . omeprazole (PRILOSEC) 20 MG capsule Take 2 capsules (40 mg total) by mouth daily.  180 capsule  3  . pirbuterol (MAXAIR) 200 MCG/INH inhaler Inhale 2 puffs into the lungs 4 (four) times daily.        . pregabalin (LYRICA) 75 MG capsule Take 1 capsule (75 mg total) by mouth 3 (three) times daily.  270 capsule  3  . quinapril (ACCUPRIL) 40 MG tablet Take 1 tablet (40 mg total) by mouth at bedtime.  90 tablet  1  . rosuvastatin (CRESTOR) 5 MG tablet 1 tablet by mouth at bedtime--labs are due now  90 tablet  0  . silver sulfADIAZINE (SILVADENE) 1 % cream Apply topically 2 (two) times daily.  50 g  0  . sitaGLIPtin (JANUVIA) 100 MG tablet Take 100 mg by mouth daily.      Marland Kitchen torsemide (DEMADEX) 20 MG tablet 1/2 tab po qd  45 tablet  1  . vitamin E 200 UNIT capsule Take 200 Units  by mouth daily.        Marland Kitchen zoster vaccine live, PF, (ZOSTAVAX) 16109 UNT/0.65ML injection Inject 19,400 Units into the skin once.  1 vial  0  . [DISCONTINUED] sitaGLIPtin (JANUVIA) 100 MG tablet Take 1 tablet (100 mg total) by mouth daily.  90 tablet  3   Allergies as of 11/08/2012 - Review Complete 11/08/2012  Allergen Reaction Noted  . Ampicillin Hives and Itching 04/06/2008  . Simvastatin  10/21/2012    reports that she has never smoked. She has never used smokeless tobacco. She reports that she does not drink alcohol or use illicit drugs.     Review of Systems: Pertinent positive and negative review of systems were noted in the above HPI section. All other review of systems were otherwise negative.  Vital signs were reviewed in today's medical record Physical Exam: General: Well developed , well nourished, no acute distress Head: Normocephalic and atraumatic Eyes:  sclerae anicteric, EOMI Ears: Normal auditory acuity Mouth: No deformity or lesions Neck: Supple, no masses or thyromegaly Lungs: Clear throughout to auscultation Heart: Regular rate and rhythm; no murmurs, rubs or bruits Abdomen: Soft, non tender and non distended. No masses, hepatosplenomegaly or hernias noted. Normal Bowel sounds. There is no succussion splash Rectal:deferred Musculoskeletal: Symmetrical with no gross deformities  Skin: No lesions on visible extremities Pulses:  Normal pulses noted Extremities: No clubbing, cyanosis, edema or deformities noted Neurological: Alert oriented x 4, grossly nonfocal Cervical Nodes:  No significant cervical adenopathy Inguinal Nodes: No significant inguinal adenopathy Psychological:  Alert and cooperative. Normal mood and affect

## 2012-11-08 NOTE — Patient Instructions (Addendum)
Discontinue Dicyclomine Discontinue Erythromycin Ok to take Vicodin up to twice a day Call back Thursday to see how your doing We have given you Amitiza samples to take twice a day

## 2012-11-09 ENCOUNTER — Other Ambulatory Visit: Payer: Self-pay | Admitting: Family Medicine

## 2012-11-10 NOTE — Telephone Encounter (Signed)
Rx sent.    MW 

## 2012-11-11 ENCOUNTER — Other Ambulatory Visit: Payer: Self-pay | Admitting: Gastroenterology

## 2012-11-11 ENCOUNTER — Telehealth: Payer: Self-pay | Admitting: Gastroenterology

## 2012-11-11 DIAGNOSIS — R109 Unspecified abdominal pain: Secondary | ICD-10-CM

## 2012-11-11 NOTE — Telephone Encounter (Signed)
Pt called and states she is still having abdominal pain. Per OV note pt scheduled for CT of abdomen and pelvis at [facility] CT 11/12/12 @ 10:15am. Pt to pick up contrast tonight.

## 2012-11-12 ENCOUNTER — Ambulatory Visit (INDEPENDENT_AMBULATORY_CARE_PROVIDER_SITE_OTHER)
Admission: RE | Admit: 2012-11-12 | Discharge: 2012-11-12 | Disposition: A | Discharge status: Home / Self Care | Payer: Medicare Other | Source: Ambulatory Visit | Attending: Gastroenterology | Admitting: Gastroenterology

## 2012-11-12 ENCOUNTER — Telehealth: Payer: Self-pay

## 2012-11-12 DIAGNOSIS — R109 Unspecified abdominal pain: Secondary | ICD-10-CM

## 2012-11-12 MED ORDER — IOHEXOL 300 MG/ML  SOLN
100.0000 mL | Freq: Once | INTRAMUSCULAR | Status: AC | PRN
  Administered 2012-11-12: 100 mL via INTRAVENOUS

## 2012-11-12 NOTE — Telephone Encounter (Signed)
Pt is calling requesting the results of her CT scan. Please advise. 

## 2012-11-15 ENCOUNTER — Other Ambulatory Visit: Payer: Self-pay

## 2012-11-15 DIAGNOSIS — G8929 Other chronic pain: Secondary | ICD-10-CM

## 2012-11-15 MED ORDER — HYDROCODONE-ACETAMINOPHEN 7.5-750 MG PO TABS: 1.0000 | ORAL_TABLET | Freq: Four times a day (QID) | ORAL | Status: DC | PRN

## 2012-11-15 MED ORDER — LINACLOTIDE 290 MCG PO CAPS: 290.0000 ug | ORAL_CAPSULE | Freq: Every day | ORAL | Status: DC

## 2012-11-15 NOTE — Telephone Encounter (Signed)
Spoke with pt and she is aware. Pt will pick up Linzess samples tomorrow.

## 2012-11-15 NOTE — Addendum Note (Signed)
Addended by: Selinda Michaels R on: 11/15/2012 04:52 PM   Modules accepted: Orders

## 2012-11-15 NOTE — Telephone Encounter (Signed)
Pt aware. Pt states she is not constipated so she does not want the linzess. Pt states she is still having constant abdominal pain and sharp pains in her sides that come and go. Pt wants to know what she can do for this. States the vicodin is helping some with the pain. Please advise.

## 2012-11-15 NOTE — Telephone Encounter (Signed)
See my result note. Let's try MiraLax

## 2012-11-15 NOTE — Telephone Encounter (Signed)
Last seen 10/25/12 and filled 10/11/12 please advise .    KP

## 2012-11-15 NOTE — Telephone Encounter (Signed)
Thyroid function tests and CT scan are negative. Please give her samples of Linzess 290 micrograms and have her return next 1-2 weeks

## 2012-11-15 NOTE — Telephone Encounter (Signed)
Left message for pt to call back  °

## 2012-11-15 NOTE — Telephone Encounter (Signed)
Please see my results of note. She has a moderate stool burden. I would try either MiraLax or Linzess.

## 2012-11-16 ENCOUNTER — Telehealth: Payer: Self-pay | Admitting: Family Medicine

## 2012-11-16 NOTE — Telephone Encounter (Signed)
Last vaccine---Pneumococcal Polysaccharide 12/29/2006. Please advise     KP

## 2012-11-16 NOTE — Telephone Encounter (Signed)
Its ok.  

## 2012-11-16 NOTE — Telephone Encounter (Signed)
Patient aware it is ok to get the vaccine.     KP

## 2012-11-16 NOTE — Telephone Encounter (Signed)
pt called wanted to schedule Pneumonia shot wants to come tomorrow--put on schedule last on was on 1.1.2008 is this ok or do I need to call her back

## 2012-11-17 ENCOUNTER — Ambulatory Visit (INDEPENDENT_AMBULATORY_CARE_PROVIDER_SITE_OTHER): Payer: Medicare Other

## 2012-11-17 DIAGNOSIS — Z299 Encounter for prophylactic measures, unspecified: Secondary | ICD-10-CM

## 2012-11-17 DIAGNOSIS — Z23 Encounter for immunization: Secondary | ICD-10-CM

## 2012-11-18 NOTE — Addendum Note (Signed)
Addended by: Lelon Perla on: 11/18/2012 01:21 PM   Modules accepted: Level of Service

## 2012-11-30 ENCOUNTER — Ambulatory Visit: Payer: Medicare Other | Admitting: Gastroenterology

## 2012-12-08 ENCOUNTER — Telehealth: Payer: Self-pay | Admitting: Family Medicine

## 2012-12-08 MED ORDER — TORSEMIDE 20 MG PO TABS: ORAL_TABLET | ORAL | Status: DC

## 2012-12-08 NOTE — Telephone Encounter (Signed)
Per call from patient, she is visiting her sister in [place] and is requesting an emergency 10 day supply of Tosemide.  She left this medication at home by accident, and will be with her sister until after Christmas.  Please send to [facility], fax [phone], phone is [phone].

## 2012-12-09 ENCOUNTER — Telehealth: Payer: Self-pay | Admitting: *Deleted

## 2012-12-09 MED ORDER — HYDROCODONE-ACETAMINOPHEN 7.5-300 MG PO TABS: 1.0000 | ORAL_TABLET | Freq: Four times a day (QID) | ORAL | Status: DC | PRN

## 2012-12-09 NOTE — Telephone Encounter (Signed)
Change to 7 -300 directions same

## 2012-12-09 NOTE — Telephone Encounter (Signed)
Rx changed and re-faxed     KP

## 2012-12-09 NOTE — Telephone Encounter (Signed)
Called from pharmacy stating that Vicodin 7.5-750 mg is no longer available and Pt is requesting a refill on med. Med is only available in 7-325 or 7-300. Last filled 08-19-11 #120.Please send to [facility], fax [phone], phone is [phone]. Please advise

## 2013-01-05 ENCOUNTER — Encounter: Payer: Self-pay | Admitting: Family Medicine

## 2013-01-06 ENCOUNTER — Other Ambulatory Visit: Payer: Self-pay | Admitting: Family Medicine

## 2013-01-06 ENCOUNTER — Encounter: Payer: Self-pay | Admitting: Gastroenterology

## 2013-01-06 ENCOUNTER — Encounter: Payer: Self-pay | Admitting: Obstetrics and Gynecology

## 2013-01-06 ENCOUNTER — Encounter: Payer: Self-pay | Admitting: Family Medicine

## 2013-01-06 MED ORDER — HYDROCODONE-ACETAMINOPHEN 7.5-300 MG PO TABS: 1.0000 | ORAL_TABLET | Freq: Four times a day (QID) | ORAL | Status: DC | PRN

## 2013-01-06 NOTE — Telephone Encounter (Signed)
Hydrocodone refill. Last seen 10/25/12 and filled 12/09/12 # 120. Please advise       KP

## 2013-01-06 NOTE — Telephone Encounter (Signed)
If you need samples all you need to do is come to the office and pick them up.  If you are still inpain or having problems you should contact the office at [phone] to schedule an appointment

## 2013-01-12 ENCOUNTER — Telehealth: Payer: Self-pay

## 2013-01-12 NOTE — Telephone Encounter (Signed)
Lm on vm tcb rgd msg 

## 2013-01-13 NOTE — Telephone Encounter (Signed)
Spoke with pt rgd email all questions answered

## 2013-01-18 ENCOUNTER — Encounter: Payer: Self-pay | Admitting: Family Medicine

## 2013-01-18 ENCOUNTER — Ambulatory Visit (INDEPENDENT_AMBULATORY_CARE_PROVIDER_SITE_OTHER): Payer: Medicare Other | Admitting: Family Medicine

## 2013-01-18 VITALS — BP 157/74 | HR 78 | Ht 64.0 in | Wt 205.0 lb

## 2013-01-18 DIAGNOSIS — N83209 Unspecified ovarian cyst, unspecified side: Secondary | ICD-10-CM

## 2013-01-18 DIAGNOSIS — N898 Other specified noninflammatory disorders of vagina: Secondary | ICD-10-CM

## 2013-01-18 DIAGNOSIS — N83299 Other ovarian cyst, unspecified side: Secondary | ICD-10-CM

## 2013-01-18 DIAGNOSIS — Z1239 Encounter for other screening for malignant neoplasm of breast: Secondary | ICD-10-CM

## 2013-01-18 DIAGNOSIS — N760 Acute vaginitis: Secondary | ICD-10-CM | POA: Insufficient documentation

## 2013-01-18 DIAGNOSIS — B9689 Other specified bacterial agents as the cause of diseases classified elsewhere: Secondary | ICD-10-CM | POA: Insufficient documentation

## 2013-01-18 NOTE — Assessment & Plan Note (Addendum)
Is highly unlikely that this 1 cm cyst is causing this patient's pain. The patient has been seen by gyn/oncology, another OB/GYN and [facility] with this exact same complaint. It is unclear the exact etiology of this pain. I am unclear as to what the patient desires from this appointment.. The risks of the surgery were discussed at length with the patient including risk of injury to bowel, bladder, ureters, and creation of a new scar tissue. The patient seems unwilling to have the surgery and I am unwilling to perform it. She does report that her OB/GYN who did her hysterectomy told her she would eventually need this ovary removed. Because she had a negative CA 125 and because of the benign appearance of this structure on both MRI and CT, I am inclined to leave it be. I agree with Dr. Denman George assessment, and doubt that any sort of surgical removal would alleviate the patient's pain. To follow her ovarian cyst, I have agreed to follow-up pelvic ultrasound in 6 months.

## 2013-01-18 NOTE — Patient Instructions (Addendum)
Pelvic Pain Pelvic pain is pain below the belly button and located between your hips. Acute pain may last a few hours or days. Chronic pelvic pain may last weeks and months. The cause may be different for different types of pain. The pain may be dull or sharp, mild or severe and can interfere with your daily activities. Write down and tell your caregiver:   Exactly where the pain is located.  If it comes and goes or is there all the time.  When it happens (with sex, urination, bowel movement, etc.)  If the pain is related to your menstrual period or stress. Your caregiver will take a full history and do a complete physical exam and Pap test. CAUSES   Painful menstrual periods (dysmenorrhea).  Normal ovulation (Mittelschmertz) that occurs in the middle of the menstrual cycle every month.  The pelvic organs get engorged with blood just before the menstrual period (pelvic congestive syndrome).  Scar tissue from an infection or past surgery (pelvic adhesions).  Cancer of the female pelvic organs. When there is pain with cancer, it has been there for a long time.  The lining of the uterus (endometrium) abnormally grows in places like the pelvis and on the pelvic organs (endometriosis).  A form of endometriosis with the lining of the uterus present inside of the muscle tissue of the uterus (adenomyosis).  Fibroid tumor (noncancerous) in the uterus.  Bladder problems such as infection, bladder spasms of the muscle tissue of the bladder.  Intestinal problems (irritable bowel syndrome, colitis, an ulcer or gastrointestinal infection).  Polyps of the cervix or uterus.  Pregnancy in the tube (ectopic pregnancy).  The opening of the cervix is too small for the menstrual blood to flow through it (cervical stenosis).  Physical or sexual abuse (past or present).  Musculo-skeletal problems from poor posture, problems with the vertebrae of the lower back or the uterine pelvic muscles falling  (prolapse).  Psychological problems such as depression or stress.  IUD (intrauterine device) in the uterus. DIAGNOSIS  Tests to make a diagnosis depends on the type, location, severity and what causes the pain to occur. Tests that may be needed include:  Blood tests.  Urine tests  Ultrasound.  X-rays.  CT Scan.  MRI.  Laparoscopy.  Major surgery. TREATMENT  Treatment will depend on the cause of the pain, which includes:  Prescription or over-the-counter pain medication.  Antibiotics.  Birth control pills.  Hormone treatment.  Nerve blocking injections.  Physical therapy.  Antidepressants.  Counseling with a psychiatrist or psychologist.  Minor or major surgery. HOME CARE INSTRUCTIONS   Only take over-the-counter or prescription medicines for pain, discomfort or fever as directed by your caregiver.  Follow your caregiver's advice to treat your pain.  Rest.  Avoid sexual intercourse if it causes the pain.  Apply warm or cold compresses (which ever works best) to the pain area.  Do relaxation exercises such as yoga or meditation.  Try acupuncture.  Avoid stressful situations.  Try group therapy.  If the pain is because of a stomach/intestinal upset, drink clear liquids, eat a bland light food diet until the symptoms go away. SEEK MEDICAL CARE IF:   You need stronger prescription pain medication.  You develop pain with sexual intercourse.  You have pain with urination.  You develop a temperature of 102 F (38.9 C) with the pain.  You are still in pain after 4 hours of taking prescription medication for the pain.  You need depression medication.    Your IUD is causing pain and you want it removed. SEEK IMMEDIATE MEDICAL CARE IF:  You develop very severe pain or tenderness.  You faint, have chills, severe weakness or dehydration.  You develop heavy vaginal bleeding or passing solid tissue.  You develop a temperature of 102 F (38.9 C)  with the pain.  You have blood in the urine.  You are being physically or sexually abused.  You have uncontrolled vomiting and diarrhea.  You are depressed and afraid of harming yourself or someone else. Document Released: 01/22/2005 Document Revised: 03/08/2012 Document Reviewed: 10/19/2008 [facility] Patient Information 2013 [place], [place]. Gastroparesis  Gastroparesis is also called slowed stomach emptying (delayed gastric emptying). It is a condition in which the stomach takes too long to empty its contents. It often happens in people with diabetes.  CAUSES  Gastroparesis happens when nerves to the stomach are damaged or stop working. When the nerves are damaged, the muscles of the stomach and intestines do not work normally. The movement of food is slowed or stopped. High blood glucose (sugar) causes changes in nerves and can damage the blood vessels that carry oxygen and nutrients to the nerves. RISK FACTORS  Diabetes.  Post-viral syndromes.  Eating disorders (anorexia, bulimia).  Surgery on the stomach or vagus nerve.  Gastroesophageal reflux disease (rarely).  Smooth muscle disorders (amyloidosis, scleroderma).  Metabolic disorders, including hypothyroidism.  Parkinson's disease. SYMPTOMS   Heartburn.  Feeling sick to your stomach (nausea).  Vomiting of undigested food.  An early feeling of fullness when eating.  Weight loss.  Abdominal bloating.  Erratic blood glucose levels.  Lack of appetite.  Gastroesophageal reflux.  Spasms of the stomach wall. Complications can include:  Bacterial overgrowth in stomach. Food stays in the stomach and can ferment and cause bacteria to grow.  Weight loss due to difficulty digesting and absorbing nutrients.  Vomiting.  Obstruction in the stomach. Undigested food can harden and cause nausea and vomiting.  Blood glucose fluctuations caused by inconsistent food absorption. DIAGNOSIS  The diagnosis of  gastroparesis is confirmed through one or more of the following tests:  Barium X-rays and scans. These tests look at how long it takes for food to move through the stomach.  Gastric manometry. This test measures electrical and muscular activity in the stomach. A thin tube is passed down the throat into the stomach. The tube contains a wire that takes measurements of the stomach's electrical and muscular activity as it digests liquids and solid food.  Endoscopy. This procedure is done with a long, thin tube called an endoscope. It is passed through the mouth and gently guides down the esophagus into the stomach. This tube helps the caregiver look at the lining of the stomach to check for any abnormalities.  Ultrasound. This can rule out gallbladder disease or pancreatitis. This test will outline and define the shape of the gallbladder and pancreas. TREATMENT   The primary treatment is to identify the problem and help control blood glucose levels. Treatments include:  Exercise.  Medicines to control nausea and vomiting.  Medicines to stimulate stomach muscles.  Changes in what and when you eat.  Having smaller meals more often.  Eating low-fiber forms of high-fiber foods, such aseating cooked vegetables instead of raw vegetables.  Eating low-fat foods.  Consuming liquids, which are easier to digest.  In severe cases, feeding tubes and intravenous (IV) feeding may be needed. It is important to note that in most cases, treatment does not cure gastroparesis. It is usually  a lasting (chronic) condition. Treatment helps you manage the condition so that you can be as healthy and comfortable as possible. NEW TREATMENTS  A gastric neurostimulator has been developed to assist people with gastroparesis. The battery-operated device is surgically implanted. It emits mild electrical pulses to help improve stomach emptying and to control nausea and vomiting.  The use of botulinum toxin has been  shown to improve stomach emptying by decreasing the prolonged contractions of the muscle between the stomach and the small intestine (pyloric sphincter). The benefits are temporary. SEEK MEDICAL CARE IF:   You are having problems keeping your blood glucose in goal range.  You are having nausea, vomiting, bloating, or early feelings of fullness with eating.  Your symptoms do not change with a change in diet. Document Released: 12/15/2005 Document Revised: 03/08/2012 Document Reviewed: 05/24/2009 [facility] Patient Information 2013 [place], [place].

## 2013-01-18 NOTE — Progress Notes (Signed)
Subjective:    Patient ID: Stacey Leon, female    DOB: [birth year], 72 y.o.   MRN: [id number]  HPI Patient is a new patient today. She is referred over for a second opinion regarding lower abdominal pain. She reports a several month history of abdominal pain that is low and in the midline. She reports that the pain is constant here. She reports associated nausea and vomiting. She reports intermittent left groin pain. The patient's workup has included seeing her primary OB/GYN Dr. Jaymes Graff, seeing Dr. Cleda Mccreedy, GYN/oncologist, Dr. Melvia Heaps, gastroenterologist, pelvic sonogram, abdominal ultrasound, CT of abdomen and pelvis, and MRI of the pelvis. The only things of note included absent uterus, right tube and ovary, a 1.3 cm simple cyst on the left ovary and a 1 cm calcification on this ovary as well. The sigmoid colon appears to be immediately adjacent to this ovary. The patient gives a history of seeing [facility] same problem in 2010, without any formal diagnosis or plan. The patient reports she had a hysterectomy in 1981, for tumors and endometriosis.  She reports that 10 years after her hysterectomy she passed a sponge through her vagina. I have no records to confirm the story. She reports normal Pap smears although she has not needed one since her hysterectomy. She reports being on Estrace since her hysterectomy. She also reports chronic vaginal discharge. She reports being treated for this on several occasions. She reports foul odor and itching. She reports that despite treatment she continues to have vaginal discharge. We discussed how long she has been on Estrace and current recommendations regarding hormone replacement therapy, and length of treatment. The patient reports trying to wean off the estrogen on several occasions but feeling too unusual to be able to complete that.  Past Medical History  Diagnosis Date  . Diabetes mellitus   . Hyperlipemia   . Osteopenia     . Peripheral neuropathy   . GERD (gastroesophageal reflux disease)   . Iron deficiency anemia   . Hypertension   . Asthma   . Ovarian cyst    Past Surgical History  Procedure Date  . Vaginal hysterectomy    Allergies  Allergen Reactions  . Ampicillin Hives and Itching  . Simvastatin    Current Outpatient Prescriptions on File Prior to Visit  Medication Sig Dispense Refill  . amLODipine (NORVASC) 5 MG tablet Take 1 tablet (5 mg total) by mouth daily.  90 tablet  3  . aspirin 81 MG tablet Take 81 mg by mouth daily.        . Azelastine-Fluticasone (DYMISTA) 137-50 MCG/ACT SUSP Place 1 spray into the nose 2 (two) times daily.      . beclomethasone (QVAR) 80 MCG/ACT inhaler Inhale 1 puff into the lungs as needed.        . Blood Glucose Monitoring Suppl (ONE TOUCH ULTRA SYSTEM KIT) W/DEVICE KIT 1 kit by Does not apply route once.  1 each  11  . Calcium Carbonate (CALTRATE 600) 1500 MG TABS Take by mouth. 1 po qd       . Casanthranol-Docusate Sodium 30-100 MG CAPS Take by mouth. 3 caps by mouth daily   Stool softner       . cetirizine (ZYRTEC) 10 MG tablet Take 10 mg by mouth daily.        Marland Kitchen co-enzyme Q-10 30 MG capsule Take 30 mg by mouth 1 dose over 46 hours.        Marland Kitchen  dicyclomine (BENTYL) 10 MG capsule Take 1 capsule (10 mg total) by mouth 4 (four) times daily -  before meals and at bedtime.  360 capsule  1  . estradiol (ESTRACE) 1 MG tablet Take 1 tablet (1 mg total) by mouth daily.  90 tablet  3  . FeAsp-FeFum -Suc-C-Thre-B12-FA (MULTIGEN PLUS) 50-101-1 MG TABS 1 po qd  30 each  11  . fluocinonide cream (LIDEX) 0.05 % Apply 1 application topically 2 (two) times daily.  30 g  5  . Hydrocodone-Acetaminophen 7.5-300 MG TABS Take 1 tablet by mouth every 6 (six) hours as needed.  120 each  0  . Inulin (FIBERCHOICE PO) Take by mouth. 2 tablets po daily       . Linaclotide (LINZESS) 290 MCG CAPS Take 290 mcg by mouth daily.  16 capsule  0  . olopatadine (PATANOL) 0.1 % ophthalmic solution  Place 1 drop into both eyes 1 day or 1 dose.  5 mL  3  . omeprazole (PRILOSEC) 20 MG capsule Take 2 capsules (40 mg total) by mouth daily.  180 capsule  3  . pirbuterol (MAXAIR) 200 MCG/INH inhaler Inhale 2 puffs into the lungs 4 (four) times daily.        . pregabalin (LYRICA) 75 MG capsule Take 1 capsule (75 mg total) by mouth 3 (three) times daily.  270 capsule  3  . quinapril (ACCUPRIL) 40 MG tablet TAKE 1 TABLET AT BEDTIME  90 tablet  0  . rosuvastatin (CRESTOR) 5 MG tablet 1 tablet by mouth at bedtime--labs are due now  90 tablet  0  . silver sulfADIAZINE (SILVADENE) 1 % cream Apply topically 2 (two) times daily.  50 g  0  . sitaGLIPtin (JANUVIA) 100 MG tablet Take 100 mg by mouth daily.      Marland Kitchen torsemide (DEMADEX) 20 MG tablet 1/2 tab po qd  10 tablet  0  . vitamin E 200 UNIT capsule Take 200 Units by mouth daily.         Family History  Problem Relation Age of Onset  . Colon cancer Maternal Uncle   . Colon cancer Paternal Uncle   . Hypertension Mother   . Anemia Mother   . Cancer Father     lung cancer  . Diabetes Sister   . Hypertension Sister   . Hypertension Brother   . Hypertension Sister   . Arthritis Sister   . Hypertension Brother    History   Social History  . Marital Status: Divorced    Spouse Name: N/A    Number of Children: N/A  . Years of Education: N/A   Occupational History  . Retired     Social History Main Topics  . Smoking status: Never Smoker   . Smokeless tobacco: Never Used  . Alcohol Use: No  . Drug Use: No  . Sexually Active: No     Comment: partial hysterectomy   Other Topics Concern  . Not on file   Social History Narrative  . No narrative on file    Review of Systems  Constitutional: Negative for fever, chills and activity change.  HENT: Negative for congestion and rhinorrhea.   Eyes: Negative for visual disturbance.  Respiratory: Positive for chest tightness. Negative for cough and shortness of breath.   Gastrointestinal:  Positive for nausea, vomiting and abdominal pain. Negative for diarrhea and constipation.  Genitourinary: Positive for dysuria and vaginal discharge.  Musculoskeletal: Positive for back pain and arthralgias.  Skin: Negative for  rash.  Neurological: Positive for dizziness.       Objective:   Physical Exam  Vitals reviewed. Constitutional: She appears well-developed and well-nourished.  HENT:  Head: Normocephalic and atraumatic.  Eyes: No scleral icterus.  Neck: Neck supple.  Cardiovascular: Normal rate.   Pulmonary/Chest: Effort normal.  Abdominal: Soft. There is no tenderness.  Genitourinary:       Vagina is well estrogenized, there is a white normal-appearing discharge noted. The cervix and uterus are absent. There is no mass or pelvic tenderness noted. There is minimal tenderness to palpation in the left groin.          Assessment & Plan:  Chronic estrogen use-up the patient should try to wean off of this medication if possible. She seemed very unwilling to do this today.  I have attempted to schedule the patient for mammography as she hasn't had one in greater than one year, however the patient was unwilling to schedule this today.

## 2013-01-18 NOTE — Assessment & Plan Note (Signed)
The patient reports this discharge is chronic. She reports his been here for greater than 10 years. She reports a sometimes a yeast infections and at other times don't find anything. I have suggested to her that perhaps continuation of estrogen and create a normal discharge for her. I have collected a wet prep today.

## 2013-01-19 LAB — WET PREP, GENITAL
Trich, Wet Prep: NONE SEEN
Yeast Wet Prep HPF POC: NONE SEEN

## 2013-01-19 MED ORDER — METRONIDAZOLE 500 MG PO TABS: 500.0000 mg | ORAL_TABLET | Freq: Two times a day (BID) | ORAL | Status: DC

## 2013-01-19 NOTE — Addendum Note (Signed)
Addended by: Reva Bores on: 01/19/2013 10:58 AM   Modules accepted: Orders

## 2013-01-19 NOTE — Progress Notes (Signed)
Patient ID: Stacey Leon, female   DOB: Mar 07, 1941, 72 y.o.   MRN: [id number] Wet prep shows few clues--will treat since pt. Complains of malodorous discharge.

## 2013-01-26 ENCOUNTER — Ambulatory Visit: Payer: Medicare Other | Admitting: *Deleted

## 2013-02-03 ENCOUNTER — Telehealth: Payer: Self-pay | Admitting: Family Medicine

## 2013-02-03 MED ORDER — HYDROCODONE-ACETAMINOPHEN 7.5-300 MG PO TABS: 1.0000 | ORAL_TABLET | Freq: Four times a day (QID) | ORAL | Status: DC | PRN

## 2013-02-03 NOTE — Telephone Encounter (Signed)
Refill x1 

## 2013-02-03 NOTE — Telephone Encounter (Signed)
Refill: Hydrocodone-acetaminophen 7.5-300 mg. Take 1 tablet by mouth every 6 hours as needed. Last fill 01-06-13

## 2013-02-03 NOTE — Telephone Encounter (Signed)
Last 10/25/12 and filled 01/06/13 #120. Please advise     KP

## 2013-02-15 ENCOUNTER — Telehealth: Payer: Self-pay | Admitting: *Deleted

## 2013-02-15 NOTE — Telephone Encounter (Signed)
Stop januvia and give samples farxiga 5 mg qd for 1 week then 10 mg 1 po qd  ---

## 2013-02-15 NOTE — Telephone Encounter (Signed)
Pt states that she has still been having the stomach pain. Pt notes that she read that the [place] could cause this. Pt would like to know if Dr Laury Axon thinks that she needs to change med..Please advise

## 2013-02-16 ENCOUNTER — Other Ambulatory Visit: Payer: Self-pay | Admitting: Family Medicine

## 2013-02-16 ENCOUNTER — Telehealth: Payer: Self-pay | Admitting: Family Medicine

## 2013-02-16 MED ORDER — QUINAPRIL HCL 40 MG PO TABS: ORAL_TABLET | ORAL | Status: DC

## 2013-02-16 NOTE — Addendum Note (Signed)
Addended by: Arnette Norris on: 02/16/2013 05:54 PM   Modules accepted: Orders

## 2013-02-16 NOTE — Telephone Encounter (Signed)
Patient states she would like rx for quinapril sent to [facility] on [address]. SHe usually uses mail order but would like to try getting it from a different pharmacy. 30 day supply

## 2013-02-16 NOTE — Telephone Encounter (Signed)
Discuss with patient  

## 2013-02-17 NOTE — Telephone Encounter (Signed)
Request a 90 day supply-- Rx resent.      KP

## 2013-02-28 ENCOUNTER — Ambulatory Visit: Payer: Medicare Other | Admitting: Family Medicine

## 2013-03-01 ENCOUNTER — Ambulatory Visit: Payer: Medicare Other | Admitting: Family Medicine

## 2013-03-02 ENCOUNTER — Encounter: Payer: Self-pay | Admitting: Family Medicine

## 2013-03-02 ENCOUNTER — Ambulatory Visit (HOSPITAL_COMMUNITY)
Admission: RE | Admit: 2013-03-02 | Discharge: 2013-03-02 | Disposition: A | Discharge status: Home / Self Care | Payer: Medicare Other | Source: Ambulatory Visit | Attending: Family Medicine | Admitting: Family Medicine

## 2013-03-02 ENCOUNTER — Ambulatory Visit (INDEPENDENT_AMBULATORY_CARE_PROVIDER_SITE_OTHER): Payer: Medicare Other | Admitting: Family Medicine

## 2013-03-02 ENCOUNTER — Telehealth: Payer: Self-pay | Admitting: Family Medicine

## 2013-03-02 ENCOUNTER — Other Ambulatory Visit: Payer: Self-pay

## 2013-03-02 VITALS — BP 122/60 | HR 84 | Temp 97.9°F | Wt 204.6 lb

## 2013-03-02 DIAGNOSIS — R51 Headache: Secondary | ICD-10-CM | POA: Insufficient documentation

## 2013-03-02 DIAGNOSIS — R350 Frequency of micturition: Secondary | ICD-10-CM

## 2013-03-02 DIAGNOSIS — R300 Dysuria: Secondary | ICD-10-CM

## 2013-03-02 DIAGNOSIS — Y9329 Activity, other involving ice and snow: Secondary | ICD-10-CM | POA: Insufficient documentation

## 2013-03-02 DIAGNOSIS — S0990XA Unspecified injury of head, initial encounter: Secondary | ICD-10-CM | POA: Insufficient documentation

## 2013-03-02 DIAGNOSIS — W010XXA Fall on same level from slipping, tripping and stumbling without subsequent striking against object, initial encounter: Secondary | ICD-10-CM | POA: Insufficient documentation

## 2013-03-02 DIAGNOSIS — E1165 Type 2 diabetes mellitus with hyperglycemia: Secondary | ICD-10-CM

## 2013-03-02 DIAGNOSIS — R209 Unspecified disturbances of skin sensation: Secondary | ICD-10-CM | POA: Insufficient documentation

## 2013-03-02 DIAGNOSIS — Z1231 Encounter for screening mammogram for malignant neoplasm of breast: Secondary | ICD-10-CM | POA: Insufficient documentation

## 2013-03-02 DIAGNOSIS — R81 Glycosuria: Secondary | ICD-10-CM

## 2013-03-02 DIAGNOSIS — Z1239 Encounter for other screening for malignant neoplasm of breast: Secondary | ICD-10-CM

## 2013-03-02 LAB — POCT URINALYSIS DIPSTICK
Blood, UA: NEGATIVE
Nitrite, UA: NEGATIVE
Spec Grav, UA: <=1.005
Urobilinogen, UA: 0.2
pH, UA: 7.5

## 2013-03-02 MED ORDER — SITAGLIPTIN PHOSPHATE 100 MG PO TABS: ORAL_TABLET | ORAL | Status: DC

## 2013-03-02 MED ORDER — ERGOCALCIFEROL 1.25 MG (50000 UT) PO CAPS: 50000.0000 [IU] | ORAL_CAPSULE | ORAL | Status: AC

## 2013-03-02 MED ORDER — HYDROCODONE-ACETAMINOPHEN 7.5-300 MG PO TABS: 1.0000 | ORAL_TABLET | Freq: Four times a day (QID) | ORAL | Status: DC | PRN

## 2013-03-02 MED ORDER — CYCLOBENZAPRINE HCL 10 MG PO TABS: 10.0000 mg | ORAL_TABLET | Freq: Three times a day (TID) | ORAL | Status: DC | PRN

## 2013-03-02 NOTE — Progress Notes (Signed)
  Subjective:    Patient ID: Stacey Leon, female    DOB: [birth year], 72 y.o.   MRN: [id number]  HPI Pt here c/o urinary frequency and discomfort for few days.    Pt also hit her head on her house when she fell on the ice Monday.  She is still having a headache.    Review of Systems As above     Objective:   Physical Exam BP 122/60  Pulse 84  Temp(Src) 97.9 F (36.6 C) (Oral)  Wt 204 lb 9.6 oz (92.806 kg)  BMI 35.1 kg/m2  SpO2 97% General appearance: alert, cooperative, appears stated age and no distress Eyes: negative findings: lids and lashes normal, conjunctivae and sclerae normal and pupils equal, round, reactive to light and accomodation Nose: Nares normal. Septum midline. Mucosa normal. No drainage or sinus tenderness. Throat: lips, mucosa, and tongue normal; teeth and gums normal Neck: no adenopathy, supple, symmetrical, trachea midline and thyroid not enlarged, symmetric, no tenderness/mass/nodules Lungs: clear to auscultation bilaterally Heart: S1, S2 normal Abdomen: soft, non-tender; bowel sounds normal; no masses,  no organomegaly      Assessment & Plan:

## 2013-03-02 NOTE — Telephone Encounter (Signed)
Hydrocodone last seen 03/02/13 and 02/03/13 #120.    Please advise    KP

## 2013-03-02 NOTE — Assessment & Plan Note (Signed)
Check culture   

## 2013-03-02 NOTE — Telephone Encounter (Signed)
refill VIT D 1.25MG  (50,000 UNIT) #4 WT/12-refills last fill 11.7.13 take 1 capsule by mouth once a week

## 2013-03-02 NOTE — Assessment & Plan Note (Signed)
S/p fall Check CT head If symptoms worsen ---- go to ER

## 2013-03-02 NOTE — Patient Instructions (Addendum)
Head Injury, Adult  You have had a head injury that does not appear serious at this time. A concussion is a state of changed mental ability, usually from a blow to the head. You should take clear liquids for the rest of the day and then resume your regular diet. You should not take sedatives or alcoholic beverages for as long as directed by your caregiver after discharge. After injuries such as yours, most problems occur within the first 24 hours.  SYMPTOMS  These minor symptoms may be experienced after discharge:   Memory difficulties.   Dizziness.   Headaches.   Double vision.   Hearing difficulties.   Depression.   Tiredness.   Weakness.   Difficulty with concentration.  If you experience any of these problems, you should not be alarmed. A concussion requires a few days for recovery. Many patients with head injuries frequently experience such symptoms. Usually, these problems disappear without medical care. If symptoms last for more than one day, notify your caregiver. See your caregiver sooner if symptoms are becoming worse rather than better.  HOME CARE INSTRUCTIONS    During the next 24 hours you must stay with someone who can watch you for the warning signs listed below.  Although it is unlikely that serious side effects will occur, you should be aware of signs and symptoms which may necessitate your return to this location. Side effects may occur up to 7  10 days following the injury. It is important for you to carefully monitor your condition and contact your caregiver or seek immediate medical attention if there is a change in your condition.  SEEK IMMEDIATE MEDICAL CARE IF:    There is confusion or drowsiness.   You can not awaken the injured person.   There is nausea (feeling sick to your stomach) or continued, forceful vomiting.   You notice dizziness or unsteadiness which is getting worse, or inability to walk.   You have convulsions or unconsciousness.   You experience severe,  persistent headaches not relieved by over-the-counter or prescription medicines for pain. (Do not take aspirin as this impairs clotting abilities). Take other pain medications only as directed.   You can not use arms or legs normally.   There is clear or bloody discharge from the nose or ears.  MAKE SURE YOU:    Understand these instructions.   Will watch your condition.   Will get help right away if you are not doing well or get worse.  Document Released: 12/15/2005 Document Revised: 03/08/2012 Document Reviewed: 11/02/2009  [employer] Patient Information 2013 [employer].

## 2013-03-02 NOTE — Telephone Encounter (Signed)
Ok to refill x1 for pain med Refill 6 months vita d

## 2013-03-03 ENCOUNTER — Encounter: Payer: Self-pay | Admitting: Family Medicine

## 2013-03-04 ENCOUNTER — Other Ambulatory Visit: Payer: Self-pay | Admitting: Family Medicine

## 2013-03-04 DIAGNOSIS — B379 Candidiasis, unspecified: Secondary | ICD-10-CM

## 2013-03-04 MED ORDER — FLUCONAZOLE 150 MG PO TABS: ORAL_TABLET | ORAL | Status: DC

## 2013-03-07 ENCOUNTER — Telehealth: Payer: Self-pay | Admitting: *Deleted

## 2013-03-07 ENCOUNTER — Ambulatory Visit (INDEPENDENT_AMBULATORY_CARE_PROVIDER_SITE_OTHER): Payer: Medicare Other | Admitting: Gastroenterology

## 2013-03-07 ENCOUNTER — Encounter: Payer: Self-pay | Admitting: Gastroenterology

## 2013-03-07 VITALS — BP 162/66 | HR 88 | Ht 64.0 in | Wt 209.0 lb

## 2013-03-07 DIAGNOSIS — R1013 Epigastric pain: Secondary | ICD-10-CM

## 2013-03-07 DIAGNOSIS — K3189 Other diseases of stomach and duodenum: Secondary | ICD-10-CM

## 2013-03-07 MED ORDER — LINACLOTIDE 290 MCG PO CAPS: 1.0000 | ORAL_CAPSULE | Freq: Every morning | ORAL | Status: AC

## 2013-03-07 NOTE — Telephone Encounter (Signed)
Pt has questions about DM management and whether she is suppose to take Januvia only for DM or if another medication will be added.

## 2013-03-07 NOTE — Patient Instructions (Addendum)
Follow up in one month  We are giving you a trial of Linzess 290 take one every day

## 2013-03-07 NOTE — Assessment & Plan Note (Addendum)
I suspect that the patient's underlying problem is constipation, likely narcotic-related, with secondary nausea. Narcotics also may be contributing to nausea. Note patient had a response to Linzess.  Workup including CT scan and prior gastric emptying scan were normal.  Medications #1 begin Linzess 290 ug qd

## 2013-03-07 NOTE — Progress Notes (Signed)
History of Present Illness:  Stacey Leon continues to complain of constipation, nausea and bloating. Nausea and bloating tend to occur if she becomes constipated. She takes narcotics 4 times a day for various aches and pains.  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  CT scan in November, 2013 was negative.  A gastric emptying scan in 2011 was normal. She tried Linzess for a week and noted improvement in her symptoms. Unfortunately, she did not call to let us know the results .    Review of Systems: Pertinent positive and negative review of systems were noted in the above HPI section. All other review of systems were otherwise negative.    Current Medications, Allergies, Past Medical History, Past Surgical History, Family History and Social History were reviewed in [employer] electronic medical record  Vital signs were reviewed in today's medical record. Physical Exam: General: Well developed , well nourished, no acute distress

## 2013-03-07 NOTE — Telephone Encounter (Signed)
For now take janumet--- check BS bid  ( fasting and 2 h after a meal--)  Call , fax or send message with BS readings next week or call if they start rising higher.

## 2013-03-08 NOTE — Telephone Encounter (Signed)
i meant [employer]

## 2013-03-08 NOTE — Telephone Encounter (Signed)
This patient is on Januvia are you switching to Janumet?

## 2013-03-08 NOTE — Telephone Encounter (Signed)
Discussed with patient and she voiced understanding.      KP 

## 2013-03-23 ENCOUNTER — Telehealth: Payer: Self-pay

## 2013-03-23 MED ORDER — PREGABALIN 75 MG PO CAPS: 75.0000 mg | ORAL_CAPSULE | Freq: Three times a day (TID) | ORAL | Status: DC

## 2013-03-23 NOTE — Telephone Encounter (Signed)
Refill request for Lyrica--- Rx faxed    KP

## 2013-03-29 ENCOUNTER — Ambulatory Visit (HOSPITAL_COMMUNITY)
Admission: RE | Admit: 2013-03-29 | Discharge: 2013-03-29 | Disposition: A | Discharge status: Home / Self Care | Payer: Medicare Other | Source: Ambulatory Visit | Attending: Family Medicine | Admitting: Family Medicine

## 2013-03-29 DIAGNOSIS — Z90710 Acquired absence of both cervix and uterus: Secondary | ICD-10-CM | POA: Insufficient documentation

## 2013-03-29 DIAGNOSIS — N83299 Other ovarian cyst, unspecified side: Secondary | ICD-10-CM

## 2013-03-29 DIAGNOSIS — N83209 Unspecified ovarian cyst, unspecified side: Secondary | ICD-10-CM | POA: Insufficient documentation

## 2013-03-29 DIAGNOSIS — N949 Unspecified condition associated with female genital organs and menstrual cycle: Secondary | ICD-10-CM | POA: Insufficient documentation

## 2013-03-29 DIAGNOSIS — Z780 Asymptomatic menopausal state: Secondary | ICD-10-CM | POA: Insufficient documentation

## 2013-03-31 ENCOUNTER — Telehealth: Payer: Self-pay | Admitting: Family Medicine

## 2013-03-31 NOTE — Telephone Encounter (Signed)
Refill x1 

## 2013-03-31 NOTE — Telephone Encounter (Signed)
Refill: Hydrocodone-acetaminophen 7.5-300. Take 1 tablet by mouth every 6 hours as needed. Last fill 03-03-13

## 2013-03-31 NOTE — Telephone Encounter (Signed)
Last seen and filled 03/02/13  #120.  Please advise      KP

## 2013-04-01 MED ORDER — HYDROCODONE-ACETAMINOPHEN 7.5-300 MG PO TABS: 1.0000 | ORAL_TABLET | Freq: Four times a day (QID) | ORAL | Status: DC | PRN

## 2013-04-11 ENCOUNTER — Ambulatory Visit: Payer: Medicare Other | Admitting: Gastroenterology

## 2013-04-19 ENCOUNTER — Encounter: Payer: Self-pay | Admitting: Family Medicine

## 2013-04-19 ENCOUNTER — Ambulatory Visit (INDEPENDENT_AMBULATORY_CARE_PROVIDER_SITE_OTHER): Payer: Medicare Other | Admitting: Family Medicine

## 2013-04-19 VITALS — BP 134/67 | HR 85 | Wt 207.0 lb

## 2013-04-19 DIAGNOSIS — R109 Unspecified abdominal pain: Secondary | ICD-10-CM

## 2013-04-19 NOTE — Patient Instructions (Signed)
Abdominal Pain  Abdominal pain can be caused by many things. Your caregiver decides the seriousness of your pain by an examination and possibly blood tests and X-rays. Many cases can be observed and treated at home. Most abdominal pain is not caused by a disease and will probably improve without treatment. However, in many cases, more time must pass before a clear cause of the pain can be found. Before that point, it may not be known if you need more testing, or if hospitalization or surgery is needed.  HOME CARE INSTRUCTIONS   · Do not take laxatives unless directed by your caregiver.  · Take pain medicine only as directed by your caregiver.  · Only take over-the-counter or prescription medicines for pain, discomfort, or fever as directed by your caregiver.  · Try a clear liquid diet (broth, tea, or water) for as long as directed by your caregiver. Slowly move to a bland diet as tolerated.  SEEK IMMEDIATE MEDICAL CARE IF:   · The pain does not go away.  · You have a fever.  · You keep throwing up (vomiting).  · The pain is felt only in portions of the abdomen. Pain in the right side could possibly be appendicitis. In an adult, pain in the left lower portion of the abdomen could be colitis or diverticulitis.  · You pass bloody or black tarry stools.  MAKE SURE YOU:   · Understand these instructions.  · Will watch your condition.  · Will get help right away if you are not doing well or get worse.  Document Released: 09/24/2005 Document Revised: 03/08/2012 Document Reviewed: 08/02/2008  ExitCare® Patient Information ©2013 [employer].

## 2013-04-19 NOTE — Progress Notes (Signed)
  Subjective:    Patient ID: Stacey Leon, female    DOB: 07-20-1941, 72 y.o.   MRN: [id number]  HPI  Returns today after repeat pelvic sono.  Her u/s shows a completely normal left ovary with resolution of 1 cm cyst.  Pt. Seems untrusting of results.  Films were brought up and shown to the pt.  She is still in search of cause of pain.  As noted earlier, she has seen another OB/GYN, GI, GYN/ONC and none of them has been able to explain her pain.     Review of Systems  Constitutional: Negative for fever and chills.  HENT: Negative for congestion.   Gastrointestinal: Positive for abdominal pain.  Skin: Negative for rash.       Objective:   Physical Exam  Vitals reviewed. Constitutional: She appears well-developed and well-nourished.  Eyes: No scleral icterus.  Neck: Neck supple.  Cardiovascular: Normal rate.   Pulmonary/Chest: Effort normal.  Abdominal: Soft.          Assessment & Plan:

## 2013-04-19 NOTE — Progress Notes (Signed)
Here for follow up of her ultrasound results.

## 2013-04-19 NOTE — Assessment & Plan Note (Signed)
Again I am unclear as to the exact etiology of her pain.  I discussed this in detail with the pt.  I do not believe a non-functioning, normal left ovary is the cause of her chronic pain.  I do not think surgery would be a good option.  Discussed possible GI causes, adhesion formation, musculoskeletal causes.

## 2013-04-21 ENCOUNTER — Telehealth: Payer: Self-pay | Admitting: Family Medicine

## 2013-04-21 NOTE — Telephone Encounter (Signed)
Pt called about one of her rx needing to have prior authorization and [facility] scripts needing a diagnosis code. Please call pt back I really couldn't get a good understanding what she was needing you to do. thanks

## 2013-04-22 NOTE — Telephone Encounter (Signed)
PA started on Pt. Placed on desk for Dr. Laury Axon to sign.

## 2013-04-26 ENCOUNTER — Other Ambulatory Visit: Payer: Self-pay | Admitting: General Practice

## 2013-04-26 DIAGNOSIS — K219 Gastro-esophageal reflux disease without esophagitis: Secondary | ICD-10-CM

## 2013-04-26 MED ORDER — OMEPRAZOLE 20 MG PO CPDR: 40.0000 mg | DELAYED_RELEASE_CAPSULE | Freq: Every day | ORAL | Status: DC

## 2013-04-26 NOTE — Telephone Encounter (Signed)
PA approved for OMEPRAZOLE from 03/26/13 until 04/25/14. Paperwork sent to scanning and refaxed to pharmacy.

## 2013-04-28 ENCOUNTER — Telehealth: Payer: Self-pay | Admitting: General Practice

## 2013-04-28 MED ORDER — HYDROCODONE-ACETAMINOPHEN 7.5-300 MG PO TABS: 1.0000 | ORAL_TABLET | Freq: Four times a day (QID) | ORAL | Status: DC | PRN

## 2013-04-28 NOTE — Telephone Encounter (Signed)
Refill x1 

## 2013-04-28 NOTE — Telephone Encounter (Signed)
Hydrocodone-Acetaminophen 7.5-300 MG TABS refill request. Pt last seen on 03-02-13, med last filled on 04-01-13 #120 with 0 refills. Ok to fill?

## 2013-05-02 ENCOUNTER — Encounter: Payer: Self-pay | Admitting: Gastroenterology

## 2013-05-02 ENCOUNTER — Ambulatory Visit (INDEPENDENT_AMBULATORY_CARE_PROVIDER_SITE_OTHER): Payer: Medicare Other | Admitting: Gastroenterology

## 2013-05-02 VITALS — BP 142/60 | HR 84 | Ht 64.0 in | Wt 206.0 lb

## 2013-05-02 DIAGNOSIS — R142 Eructation: Secondary | ICD-10-CM

## 2013-05-02 DIAGNOSIS — R141 Gas pain: Secondary | ICD-10-CM

## 2013-05-02 MED ORDER — HYOSCYAMINE SULFATE ER 0.375 MG PO TBCR: EXTENDED_RELEASE_TABLET | ORAL | Status: AC

## 2013-05-02 NOTE — Patient Instructions (Addendum)
You will need to follow up in 2 months

## 2013-05-02 NOTE — Progress Notes (Signed)
History of Present Illness:  Pt has returned for followup of abdominal discomfort, constipation and nausea.  On Linzess she is moving her bowels more regularly. Nausea and discomfort have improved. She still has occasional nausea. She continues to complain of fullness in her left upper quadrant. It may be slightly relieved when she moves her bowels. It is unaffected by eating.    Review of Systems: Pertinent positive and negative review of systems were noted in the above HPI section. All other review of systems were otherwise negative.    Current Medications, Allergies, Past Medical History, Past Surgical History, Family History and Social History were reviewed in [employer] electronic medical record  Vital signs were reviewed in today's medical record. Physical Exam: General: Well developed , well nourished, no acute distress Skin: anicteric Head: Normocephalic and atraumatic Eyes:  sclerae anicteric, EOMI Ears: Normal auditory acuity Mouth: No deformity or lesions Lungs: Clear throughout to auscultation Heart: Regular rate and rhythm; no murmurs, rubs or bruits Abdomen: Soft, non tender and non distended. No masses, hepatosplenomegaly or hernias noted. Normal Bowel sounds.  There are no obvious hernias Rectal:deferred Musculoskeletal: Symmetrical with no gross deformities  Pulses:  Normal pulses noted Extremities: No clubbing, cyanosis, edema or deformities noted Neurological: Alert oriented x 4, grossly nonfocal Psychological:  Alert and cooperative. Normal mood and affect

## 2013-05-02 NOTE — Assessment & Plan Note (Addendum)
Abdominal pain and bloating are probably related to chronic constipation, improved since starting Linzess  Nausea and upper abdominal pain are nonspecific. Symptoms are likely exacerbated by narcotic use.  Recommendations #1 continue current medications #2 hyomax when necessary for upper abdominal discomfort

## 2013-05-24 ENCOUNTER — Other Ambulatory Visit: Payer: Self-pay | Admitting: Family Medicine

## 2013-05-24 NOTE — Telephone Encounter (Signed)
Med filled.  

## 2013-05-27 ENCOUNTER — Telehealth: Payer: Self-pay | Admitting: Family Medicine

## 2013-05-27 NOTE — Telephone Encounter (Signed)
Spoke with patient and she is willing to wait until Monday for Dr.Lowne.       KP

## 2013-05-27 NOTE — Telephone Encounter (Signed)
Darl Pikes with [facility]  is calling because something is wrong with their fax machine and she is unable to send our office a refill request for the patient's Hydrocodone Rx. She is asking that we send over a script to refill the patient's script or call to approve.

## 2013-05-27 NOTE — Telephone Encounter (Signed)
Last seen 03/02/13 and 04/28/13 #120. Please advise      KP

## 2013-05-27 NOTE — Telephone Encounter (Signed)
Ok for #30 or she can wait until Monday for Dr Laury Axon to fill

## 2013-05-29 NOTE — Telephone Encounter (Signed)
Ok to refill normal amount x1

## 2013-05-30 MED ORDER — HYDROCODONE-ACETAMINOPHEN 7.5-300 MG PO TABS: 1.0000 | ORAL_TABLET | Freq: Four times a day (QID) | ORAL | Status: DC | PRN

## 2013-05-30 NOTE — Addendum Note (Signed)
Addended by: Arnette Norris on: 05/30/2013 08:08 AM   Modules accepted: Orders

## 2013-06-15 ENCOUNTER — Other Ambulatory Visit: Payer: Self-pay | Admitting: Family Medicine

## 2013-06-15 ENCOUNTER — Encounter: Payer: Self-pay | Admitting: Lab

## 2013-06-16 ENCOUNTER — Encounter: Payer: Self-pay | Admitting: Family Medicine

## 2013-06-16 ENCOUNTER — Ambulatory Visit (INDEPENDENT_AMBULATORY_CARE_PROVIDER_SITE_OTHER): Payer: Medicare Other | Admitting: Family Medicine

## 2013-06-16 VITALS — BP 144/72 | HR 86 | Temp 98.4°F | Wt 201.4 lb

## 2013-06-16 DIAGNOSIS — G894 Chronic pain syndrome: Secondary | ICD-10-CM

## 2013-06-16 DIAGNOSIS — M549 Dorsalgia, unspecified: Secondary | ICD-10-CM

## 2013-06-16 MED ORDER — HYDROCODONE-ACETAMINOPHEN 7.5-300 MG PO TABS: 1.0000 | ORAL_TABLET | Freq: Four times a day (QID) | ORAL | Status: DC | PRN

## 2013-06-16 NOTE — Progress Notes (Signed)
  Subjective:    Patient here for follow-up of elevated blood pressure.  She is not exercising and is adherent to a low-salt diet.  Blood pressure is not well controlled at home. Cardiac symptoms: none. Patient denies: chest pain, chest pressure/discomfort, claudication, dyspnea, exertional chest pressure/discomfort, fatigue, irregular heart beat, lower extremity edema, near-syncope, orthopnea, palpitations, paroxysmal nocturnal dyspnea, syncope and tachypnea. Cardiovascular risk factors: advanced age (older than 53 for men, 55 for women), diabetes mellitus, dyslipidemia, hypertension, obesity (BMI >= 30 kg/m2) and sedentary lifestyle. Use of agents associated with hypertension: none. History of target organ damage: none. Pt is also struggling with increased pain.  This occurred the same time her bp elevated.   The following portions of the patient's history were reviewed and updated as appropriate: allergies, current medications, past family history, past medical history, past social history, past surgical history and problem list.  Review of Systems Pertinent items are noted in HPI.     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 Evidence of target organ damage:    Back pain--- refer to ortho for eval ---pt does not want surgery     .    Plan:    Medication: no change. Dietary sodium restriction. Regular aerobic exercise. Check blood pressures 2-3  times weekly and record. Follow up: a few weeks and  as needed.

## 2013-06-16 NOTE — Patient Instructions (Signed)
Back Pain, Adult  Low back pain is very common. About 1 in 5 people have back pain. The cause of low back pain is rarely dangerous. The pain often gets better over time. About half of people with a sudden onset of back pain feel better in just 2 weeks. About 8 in 10 people feel better by 6 weeks.   CAUSES  Some common causes of back pain include:  · Strain of the muscles or ligaments supporting the spine.  · Wear and tear (degeneration) of the spinal discs.  · Arthritis.  · Direct injury to the back.  DIAGNOSIS  Most of the time, the direct cause of low back pain is not known. However, back pain can be treated effectively even when the exact cause of the pain is unknown. Answering your caregiver's questions about your overall health and symptoms is one of the most accurate ways to make sure the cause of your pain is not dangerous. If your caregiver needs more information, he or she may order lab work or imaging tests (X-rays or MRIs). However, even if imaging tests show changes in your back, this usually does not require surgery.  HOME CARE INSTRUCTIONS  For many people, back pain returns. Since low back pain is rarely dangerous, it is often a condition that people can learn to manage on their own.   · Remain active. It is stressful on the back to sit or stand in one place. Do not sit, drive, or stand in one place for more than 30 minutes at a time. Take short walks on level surfaces as soon as pain allows. Try to increase the length of time you walk each day.  · Do not stay in bed. Resting more than 1 or 2 days can delay your recovery.  · Do not avoid exercise or work. Your body is made to move. It is not dangerous to be active, even though your back may hurt. Your back will likely heal faster if you return to being active before your pain is gone.  · Pay attention to your body when you  bend and lift. Many people have less discomfort when lifting if they bend their knees, keep the load close to their bodies, and  avoid twisting. Often, the most comfortable positions are those that put less stress on your recovering back.  · Find a comfortable position to sleep. Use a firm mattress and lie on your side with your knees slightly bent. If you lie on your back, put a pillow under your knees.  · Only take over-the-counter or prescription medicines as directed by your caregiver. Over-the-counter medicines to reduce pain and inflammation are often the most helpful. Your caregiver may prescribe muscle relaxant drugs. These medicines help dull your pain so you can more quickly return to your normal activities and healthy exercise.  · Put ice on the injured area.  · Put ice in a plastic bag.  · Place a towel between your skin and the bag.  · Leave the ice on for 15-20 minutes, 3-4 times a day for the first 2 to 3 days. After that, ice and heat may be alternated to reduce pain and spasms.  · Ask your caregiver about trying back exercises and gentle massage. This may be of some benefit.  · Avoid feeling anxious or stressed. Stress increases muscle tension and can worsen back pain. It is important to recognize when you are anxious or stressed and learn ways to manage it. Exercise is a great option.  SEEK MEDICAL CARE IF:  · You have pain that is not relieved with rest or   medicine.  · You have pain that does not improve in 1 week.  · You have new symptoms.  · You are generally not feeling well.  SEEK IMMEDIATE MEDICAL CARE IF:   · You have pain that radiates from your back into your legs.  · You develop new bowel or bladder control problems.  · You have unusual weakness or numbness in your arms or legs.  · You develop nausea or vomiting.  · You develop abdominal pain.  · You feel faint.  Document Released: 12/15/2005 Document Revised: 06/15/2012 Document Reviewed: 05/05/2011  ExitCare® Patient Information ©2014 [employer].

## 2013-07-04 ENCOUNTER — Ambulatory Visit: Payer: Medicare Other | Admitting: Gastroenterology

## 2013-07-13 ENCOUNTER — Other Ambulatory Visit: Payer: Self-pay | Admitting: *Deleted

## 2013-07-13 MED ORDER — LINACLOTIDE 290 MCG PO CAPS: 290.0000 ug | ORAL_CAPSULE | Freq: Every morning | ORAL | Status: DC

## 2013-07-21 ENCOUNTER — Other Ambulatory Visit: Payer: Self-pay | Admitting: Orthopedic Surgery

## 2013-07-21 DIAGNOSIS — M79602 Pain in left arm: Secondary | ICD-10-CM

## 2013-07-21 DIAGNOSIS — M542 Cervicalgia: Secondary | ICD-10-CM

## 2013-07-27 LAB — HM DIABETES EYE EXAM

## 2013-07-28 ENCOUNTER — Ambulatory Visit
Admission: RE | Admit: 2013-07-28 | Discharge: 2013-07-28 | Disposition: A | Discharge status: Home / Self Care | Payer: Medicare Other | Source: Ambulatory Visit | Attending: Orthopedic Surgery | Admitting: Orthopedic Surgery

## 2013-07-28 DIAGNOSIS — M542 Cervicalgia: Secondary | ICD-10-CM

## 2013-07-28 DIAGNOSIS — M79602 Pain in left arm: Secondary | ICD-10-CM

## 2013-08-01 ENCOUNTER — Other Ambulatory Visit: Payer: Self-pay | Admitting: Family Medicine

## 2013-08-01 ENCOUNTER — Ambulatory Visit
Admission: RE | Admit: 2013-08-01 | Discharge: 2013-08-01 | Disposition: A | Discharge status: Home / Self Care | Payer: Medicare Other | Source: Ambulatory Visit | Attending: Orthopedic Surgery | Admitting: Orthopedic Surgery

## 2013-08-01 NOTE — Telephone Encounter (Signed)
Patient left message on triage line requesting refill on hydrocodone Lat OV and refill both on 06/16/13 #120 no RF. Okay to refill?

## 2013-08-08 ENCOUNTER — Encounter: Payer: Self-pay | Admitting: Gastroenterology

## 2013-08-08 ENCOUNTER — Ambulatory Visit (INDEPENDENT_AMBULATORY_CARE_PROVIDER_SITE_OTHER): Payer: Medicare Other | Admitting: Gastroenterology

## 2013-08-08 VITALS — BP 140/80 | HR 76 | Ht 64.0 in | Wt 207.0 lb

## 2013-08-08 DIAGNOSIS — R143 Flatulence: Secondary | ICD-10-CM

## 2013-08-08 DIAGNOSIS — R141 Gas pain: Secondary | ICD-10-CM

## 2013-08-08 DIAGNOSIS — R142 Eructation: Secondary | ICD-10-CM

## 2013-08-08 NOTE — Patient Instructions (Addendum)
Dr Arlyce Dice has recommended you contact a pain clinic  [facility] [phone]  [phone] Or [facility]   [phone]

## 2013-08-08 NOTE — Assessment & Plan Note (Addendum)
Abdominal pain and bloating are probably related to chronic constipation, improved since starting Linzess  Nausea and upper abdominal pain are  decreased since she has started moving her bowels regularly. Symptoms are likely exacerbated by narcotic use.  Recommendations #1 continue current medications #2 hyomax when necessary for upper abdominal discomfort

## 2013-08-08 NOTE — Progress Notes (Signed)
History of Present Illness:  Ms Stacey Leon reports improvement in her bloating and abdominal discomfort  since taking lialda.  Her main complaint is joint pain, for which she takes narcotics regularly.    Review of Systems: Pertinent positive and negative review of systems were noted in the above HPI section. All other review of systems were otherwise negative.    Current Medications, Allergies, Past Medical History, Past Surgical History, Family History and Social History were reviewed in [employer] electronic medical record  Vital signs were reviewed in today's medical record. Physical Exam: General: Well developed , well nourished, no acute distress

## 2013-08-10 ENCOUNTER — Other Ambulatory Visit: Payer: Self-pay | Admitting: Orthopedic Surgery

## 2013-08-10 ENCOUNTER — Telehealth: Payer: Self-pay | Admitting: Family Medicine

## 2013-08-10 DIAGNOSIS — M5030 Other cervical disc degeneration, unspecified cervical region: Secondary | ICD-10-CM

## 2013-08-10 DIAGNOSIS — M4800 Spinal stenosis, site unspecified: Secondary | ICD-10-CM

## 2013-08-10 NOTE — Telephone Encounter (Signed)
Please advise and advise on the type of insulin    KP

## 2013-08-10 NOTE — Telephone Encounter (Signed)
Patient states that she is scheduled tomorrow to have an injection at [facility] imaging. Pt says this injection normally causes her blood sugar to rise. Wants to know if she can come pick up a sample of insulin to have on hand. Please advise.

## 2013-08-10 NOTE — Telephone Encounter (Signed)
Regular insulin---- does she still have the sliding scale?

## 2013-08-11 ENCOUNTER — Ambulatory Visit
Admission: RE | Admit: 2013-08-11 | Discharge: 2013-08-11 | Disposition: A | Discharge status: Home / Self Care | Payer: Medicare Other | Source: Ambulatory Visit | Attending: Orthopedic Surgery | Admitting: Orthopedic Surgery

## 2013-08-11 VITALS — BP 160/77 | HR 74

## 2013-08-11 DIAGNOSIS — M4800 Spinal stenosis, site unspecified: Secondary | ICD-10-CM

## 2013-08-11 DIAGNOSIS — M5030 Other cervical disc degeneration, unspecified cervical region: Secondary | ICD-10-CM

## 2013-08-11 DIAGNOSIS — M545 Low back pain, unspecified: Secondary | ICD-10-CM

## 2013-08-11 DIAGNOSIS — M549 Dorsalgia, unspecified: Secondary | ICD-10-CM

## 2013-08-11 MED ORDER — TRIAMCINOLONE ACETONIDE 40 MG/ML IJ SUSP (RADIOLOGY)
60.0000 mg | Freq: Once | INTRAMUSCULAR | Status: AC
  Administered 2013-08-11: 60 mg via EPIDURAL

## 2013-08-11 MED ORDER — IOHEXOL 300 MG/ML  SOLN
1.0000 mL | Freq: Once | INTRAMUSCULAR | Status: AC | PRN
  Administered 2013-08-11: 1 mL via INTRAVENOUS

## 2013-08-11 NOTE — Telephone Encounter (Signed)
Spoke with patient. She will come to office and pick up regular insulin. Follow sliding scale guidelines.

## 2013-08-31 ENCOUNTER — Ambulatory Visit (INDEPENDENT_AMBULATORY_CARE_PROVIDER_SITE_OTHER): Payer: Medicare Other | Admitting: Family Medicine

## 2013-08-31 ENCOUNTER — Encounter: Payer: Self-pay | Admitting: Family Medicine

## 2013-08-31 VITALS — BP 136/70 | HR 82 | Temp 98.4°F | Wt 205.8 lb

## 2013-08-31 DIAGNOSIS — M1990 Unspecified osteoarthritis, unspecified site: Secondary | ICD-10-CM

## 2013-08-31 DIAGNOSIS — E1149 Type 2 diabetes mellitus with other diabetic neurological complication: Secondary | ICD-10-CM

## 2013-08-31 DIAGNOSIS — M545 Low back pain, unspecified: Secondary | ICD-10-CM

## 2013-08-31 DIAGNOSIS — R011 Cardiac murmur, unspecified: Secondary | ICD-10-CM

## 2013-08-31 DIAGNOSIS — E1159 Type 2 diabetes mellitus with other circulatory complications: Secondary | ICD-10-CM

## 2013-08-31 DIAGNOSIS — M5442 Lumbago with sciatica, left side: Secondary | ICD-10-CM

## 2013-08-31 DIAGNOSIS — E559 Vitamin D deficiency, unspecified: Secondary | ICD-10-CM

## 2013-08-31 DIAGNOSIS — E1142 Type 2 diabetes mellitus with diabetic polyneuropathy: Secondary | ICD-10-CM

## 2013-08-31 DIAGNOSIS — M5430 Sciatica, unspecified side: Secondary | ICD-10-CM

## 2013-08-31 DIAGNOSIS — E785 Hyperlipidemia, unspecified: Secondary | ICD-10-CM

## 2013-08-31 DIAGNOSIS — M4802 Spinal stenosis, cervical region: Secondary | ICD-10-CM | POA: Insufficient documentation

## 2013-08-31 DIAGNOSIS — I10 Essential (primary) hypertension: Secondary | ICD-10-CM

## 2013-08-31 DIAGNOSIS — R0989 Other specified symptoms and signs involving the circulatory and respiratory systems: Secondary | ICD-10-CM

## 2013-08-31 DIAGNOSIS — E669 Obesity, unspecified: Secondary | ICD-10-CM | POA: Insufficient documentation

## 2013-08-31 DIAGNOSIS — E1140 Type 2 diabetes mellitus with diabetic neuropathy, unspecified: Secondary | ICD-10-CM

## 2013-08-31 MED ORDER — HYDROCODONE-ACETAMINOPHEN 7.5-300 MG PO TABS: ORAL_TABLET | ORAL | Status: DC

## 2013-08-31 NOTE — Assessment & Plan Note (Signed)
Labs are overdue con't meds

## 2013-08-31 NOTE — Assessment & Plan Note (Signed)
Ortho referring pt to pain management

## 2013-08-31 NOTE — Assessment & Plan Note (Addendum)
Worsening with weakness in L leg Check mri Pain meds renewed

## 2013-08-31 NOTE — Assessment & Plan Note (Signed)
Stable con't meds 

## 2013-08-31 NOTE — Patient Instructions (Addendum)

## 2013-08-31 NOTE — Assessment & Plan Note (Signed)
Check labs con't meds 

## 2013-08-31 NOTE — Assessment & Plan Note (Signed)
Check labs 

## 2013-08-31 NOTE — Progress Notes (Signed)
  Subjective:    Patient ID: Stacey Leon, female    DOB: Jan 15, 1941, 72 y.o.   MRN: [id number]  HPI HYPERTENSION Disease Monitoring Blood pressure range-not checking  Chest pain- no      Dyspnea- no Medications Compliance- good Lightheadedness- no   Edema- no   DIABETES Disease Monitoring Blood Sugar ranges-good per pt Polyuria- no New Visual problems- no Medications Compliance- good Hypoglycemic symptoms- no   HYPERLIPIDEMIA Disease Monitoring See symptoms for Hypertension Medications Compliance- good RUQ pain- no  Muscle aches- no  ROS See HPI above   PMH Smoking Status noted     Review of Systems As abovd    Objective:   Physical Exam BP 136/70  Pulse 82  Temp(Src) 98.4 F (36.9 C) (Oral)  Wt 205 lb 12.8 oz (93.35 kg)  BMI 35.31 kg/m2  SpO2 97% General appearance: alert, cooperative, appears stated age and no distress Neck: no adenopathy, no carotid bruit, no JVD, supple, symmetrical, trachea midline and thyroid not enlarged, symmetric, no tenderness/mass/nodules Lungs: clear to auscultation bilaterally Heart: S1, S2 normal Extremities: extremities normal, atraumatic, no cyanosis or edema Neurologic: dtr = and b/l                   Weakness L low leg        Assessment & Plan:

## 2013-09-05 ENCOUNTER — Telehealth: Payer: Self-pay | Admitting: Family Medicine

## 2013-09-05 NOTE — Telephone Encounter (Signed)
Patient called requesting rx for valium 10 mg to take for her upcoming MRI on 09/14/13.

## 2013-09-05 NOTE — Telephone Encounter (Signed)
Ok to give 10 pills ----sig---take 1 po 30 min before procedure and take bottle with you

## 2013-09-05 NOTE — Telephone Encounter (Signed)
Is this ok?

## 2013-09-06 MED ORDER — DIAZEPAM 10 MG PO TABS: ORAL_TABLET | ORAL | Status: AC

## 2013-09-06 NOTE — Telephone Encounter (Signed)
patient aware faxed      KP

## 2013-09-08 ENCOUNTER — Encounter (INDEPENDENT_AMBULATORY_CARE_PROVIDER_SITE_OTHER): Payer: Medicare Other

## 2013-09-08 DIAGNOSIS — I6529 Occlusion and stenosis of unspecified carotid artery: Secondary | ICD-10-CM

## 2013-09-08 DIAGNOSIS — R0989 Other specified symptoms and signs involving the circulatory and respiratory systems: Secondary | ICD-10-CM

## 2013-09-08 DIAGNOSIS — R209 Unspecified disturbances of skin sensation: Secondary | ICD-10-CM

## 2013-09-14 ENCOUNTER — Ambulatory Visit
Admission: RE | Admit: 2013-09-14 | Discharge: 2013-09-14 | Disposition: A | Discharge status: Home / Self Care | Payer: Medicare Other | Source: Ambulatory Visit | Attending: Family Medicine | Admitting: Family Medicine

## 2013-09-14 DIAGNOSIS — M5442 Lumbago with sciatica, left side: Secondary | ICD-10-CM

## 2013-09-15 ENCOUNTER — Telehealth: Payer: Self-pay | Admitting: Family Medicine

## 2013-09-15 MED ORDER — PREGABALIN 75 MG PO CAPS: 75.0000 mg | ORAL_CAPSULE | Freq: Three times a day (TID) | ORAL | Status: DC

## 2013-09-15 NOTE — Telephone Encounter (Signed)
Pt called in for refill. Done.

## 2013-09-15 NOTE — Telephone Encounter (Signed)
Refill: Lyrica caps 75 mg #270. Take 1 capsule three times a day.

## 2013-09-16 ENCOUNTER — Other Ambulatory Visit: Payer: Self-pay

## 2013-09-16 DIAGNOSIS — M48061 Spinal stenosis, lumbar region without neurogenic claudication: Secondary | ICD-10-CM

## 2013-09-19 ENCOUNTER — Telehealth: Payer: Self-pay | Admitting: Family Medicine

## 2013-09-19 NOTE — Telephone Encounter (Signed)
Spoke with the patient.  She states she saw Dr Lovell Sheehan at [facility] last month and he told her there was nothing he could do for her if she does not want surgery.  She does not want surgery.  Who would you like for me to get her an appointment with

## 2013-09-19 NOTE — Telephone Encounter (Signed)
She asked to see anyone but him we I spoke with her, you can try to refer her to someone else in the practice.     KP

## 2013-09-22 ENCOUNTER — Ambulatory Visit (HOSPITAL_COMMUNITY): Payer: Medicare Other | Attending: Cardiovascular Disease | Admitting: Radiology

## 2013-09-22 DIAGNOSIS — R011 Cardiac murmur, unspecified: Secondary | ICD-10-CM

## 2013-09-22 NOTE — Progress Notes (Signed)
Echocardiogram performed.  

## 2013-09-27 ENCOUNTER — Other Ambulatory Visit (INDEPENDENT_AMBULATORY_CARE_PROVIDER_SITE_OTHER): Payer: Medicare Other

## 2013-09-27 DIAGNOSIS — E785 Hyperlipidemia, unspecified: Secondary | ICD-10-CM

## 2013-09-27 DIAGNOSIS — E1159 Type 2 diabetes mellitus with other circulatory complications: Secondary | ICD-10-CM

## 2013-09-27 DIAGNOSIS — E559 Vitamin D deficiency, unspecified: Secondary | ICD-10-CM

## 2013-09-27 LAB — BASIC METABOLIC PANEL
BUN: 13 mg/dL (ref 6–23)
CO2: 27 mEq/L (ref 19–32)
Chloride: 102 mEq/L (ref 96–112)
Creatinine, Ser: 0.7 mg/dL (ref 0.4–1.2)
Potassium: 3.9 mEq/L (ref 3.5–5.1)

## 2013-09-27 LAB — CBC WITH DIFFERENTIAL/PLATELET
Basophils Relative: 0.1 % (ref 0.0–3.0)
Eosinophils Absolute: 0.2 10*3/uL (ref 0.0–0.7)
Eosinophils Relative: 2.8 % (ref 0.0–5.0)
HCT: 39.8 % (ref 36.0–46.0)
Lymphs Abs: 2.1 10*3/uL (ref 0.7–4.0)
MCHC: 32.4 g/dL (ref 30.0–36.0)
MCV: 81.9 fl (ref 78.0–100.0)
Monocytes Absolute: 0.3 10*3/uL (ref 0.1–1.0)
Neutrophils Relative %: 58.6 % (ref 43.0–77.0)
Platelets: 290 10*3/uL (ref 150.0–400.0)
WBC: 6.1 10*3/uL (ref 4.5–10.5)

## 2013-09-27 LAB — HEPATIC FUNCTION PANEL
ALT: 20 U/L (ref 0–35)
Bilirubin, Direct: 0 mg/dL (ref 0.0–0.3)
Total Bilirubin: 0.2 mg/dL — ABNORMAL LOW (ref 0.3–1.2)
Total Protein: 6.7 g/dL (ref 6.0–8.3)

## 2013-09-27 LAB — POCT URINALYSIS DIPSTICK
Blood, UA: NEGATIVE
Nitrite, UA: NEGATIVE
Protein, UA: NEGATIVE
Spec Grav, UA: <=1.005
Urobilinogen, UA: 0.2
pH, UA: 6

## 2013-09-27 LAB — MICROALBUMIN / CREATININE URINE RATIO
Creatinine,U: 25.3 mg/dL
Microalb Creat Ratio: 0.8 mg/g (ref 0.0–30.0)
Microalb, Ur: 0.2 mg/dL (ref 0.0–1.9)

## 2013-09-27 LAB — LIPID PANEL
Cholesterol: 219 mg/dL — ABNORMAL HIGH (ref 0–200)
HDL: 47 mg/dL (ref >39.00)
Triglycerides: 143 mg/dL (ref 0.0–149.0)

## 2013-09-28 ENCOUNTER — Other Ambulatory Visit: Payer: Self-pay

## 2013-09-28 MED ORDER — GLIMEPIRIDE 2 MG PO TABS: 2.0000 mg | ORAL_TABLET | Freq: Every day | ORAL | Status: AC

## 2013-09-29 ENCOUNTER — Ambulatory Visit (INDEPENDENT_AMBULATORY_CARE_PROVIDER_SITE_OTHER): Payer: Medicare Other | Admitting: Family Medicine

## 2013-09-29 ENCOUNTER — Encounter: Payer: Self-pay | Admitting: Family Medicine

## 2013-09-29 VITALS — BP 158/70 | HR 80 | Temp 98.5°F | Wt 204.8 lb

## 2013-09-29 DIAGNOSIS — I6529 Occlusion and stenosis of unspecified carotid artery: Secondary | ICD-10-CM

## 2013-09-29 DIAGNOSIS — E1140 Type 2 diabetes mellitus with diabetic neuropathy, unspecified: Secondary | ICD-10-CM

## 2013-09-29 DIAGNOSIS — M4802 Spinal stenosis, cervical region: Secondary | ICD-10-CM

## 2013-09-29 DIAGNOSIS — I6523 Occlusion and stenosis of bilateral carotid arteries: Secondary | ICD-10-CM

## 2013-09-29 DIAGNOSIS — I739 Peripheral vascular disease, unspecified: Secondary | ICD-10-CM

## 2013-09-29 DIAGNOSIS — E785 Hyperlipidemia, unspecified: Secondary | ICD-10-CM

## 2013-09-29 DIAGNOSIS — E1149 Type 2 diabetes mellitus with other diabetic neurological complication: Secondary | ICD-10-CM

## 2013-09-29 DIAGNOSIS — I10 Essential (primary) hypertension: Secondary | ICD-10-CM

## 2013-09-29 DIAGNOSIS — M1990 Unspecified osteoarthritis, unspecified site: Secondary | ICD-10-CM

## 2013-09-29 DIAGNOSIS — E1159 Type 2 diabetes mellitus with other circulatory complications: Secondary | ICD-10-CM

## 2013-09-29 DIAGNOSIS — R0989 Other specified symptoms and signs involving the circulatory and respiratory systems: Secondary | ICD-10-CM

## 2013-09-29 DIAGNOSIS — E1142 Type 2 diabetes mellitus with diabetic polyneuropathy: Secondary | ICD-10-CM

## 2013-09-29 DIAGNOSIS — I658 Occlusion and stenosis of other precerebral arteries: Secondary | ICD-10-CM

## 2013-09-29 MED ORDER — HYDROCODONE-ACETAMINOPHEN 7.5-300 MG PO TABS: ORAL_TABLET | ORAL | Status: DC

## 2013-09-29 MED ORDER — PITAVASTATIN CALCIUM 1 MG PO TABS: ORAL_TABLET | ORAL | Status: AC

## 2013-09-29 NOTE — Patient Instructions (Addendum)
Diabetes and Standards of Medical Care  Diabetes is complicated. You may find that your diabetes team includes a dietitian, nurse, diabetes educator, eye doctor, and more. To help everyone know what is going on and to help you get the care you deserve, the following schedule of care was developed to help keep you on track. Below are the tests, exams, vaccines, medicines, education, and plans you will need. A1c test  Performed at least 2 times a year if you are meeting treatment goals.  Performed 4 times a year if therapy has changed or if you are not meeting treatment goals. Blood pressure test  Performed at every routine medical visit. The goal is less than 120/80 mmHg. Dental exam  Follow up with the dentist regularly. Eye exam  Diagnosed with type 1 diabetes as a child: Get an exam upon reaching the age of 10 years or older and having had diabetes for 3 5 years. Yearly eye exams are recommended after that initial eye exam.  Diagnosed with type 1 diabetes as an adult: Get an exam within 5 years of diagnosis and then yearly.  Diagnosed with type 2 diabetes: Get an exam as soon as possible after the diagnosis and then yearly. Foot care exam  Visual foot exams are performed at every routine medical visit. The exams check for cuts, injuries, or other problems with the feet.  A comprehensive foot exam should be done yearly. This includes visual inspection as well as assessing foot pulses and testing for loss of sensation. Kidney function test (urine microalbumin)  Performed once a year.  Type 1 diabetes: The first test is performed 5 years after diagnosis.  Type 2 diabetes: The first test is performed at the time of diagnosis.  A serum creatinine and estimated glomerular filtration rate (eGFR) test is done once a year to tell the level of chronic kidney disease (CKD), if present. Lipid profile (Cholesterol, HDL, LDL, Triglycerides)  Performed every 5 years for most people.  The  goal for LDL is less than 100 mg/dl. If at high risk, the goal is less than 70 mg/dl.  The goal for HDL is 40 mg/dl 50 mg/dl for men and 50 mg/dl 60 mg/dl for women. An HDL cholesterol of 60 mg/dL or higher gives some protection against heart disease.  The goal for triglycerides is less than 150 mg/dl. Influenza vaccine, pneumococcal vaccine, and hepatitis B vaccine  The influenza vaccine is recommended yearly.  The pneumococcal vaccine is generally given once in a lifetime. However, there are some instances when another vaccination is recommended. Check with your caregiver.  The hepatitis B vaccine is also recommended for adults with diabetes. Diabetes self-management education  Recommended at diagnosis and ongoing as needed. Treatment plan  Reviewed at every medical visit. Document Released: 10/12/2009 Document Revised: 12/01/2012 Document Reviewed: 06/17/2011 ExitCare Patient Information 2014 [employer].  

## 2013-09-29 NOTE — Assessment & Plan Note (Signed)
con'tlyrica 

## 2013-09-29 NOTE — Assessment & Plan Note (Signed)
Stable con't meds 

## 2013-09-29 NOTE — Assessment & Plan Note (Signed)
Check carotid doppler 

## 2013-09-29 NOTE — Progress Notes (Signed)
  Subjective:    Patient ID: Stacey Leon, female    DOB: Sep 07, 1941, 72 y.o.   MRN: [id number]  HPI HYPERTENSION Disease Monitoring Blood pressure range-stable Chest pain- no      Dyspnea- no Medications Compliance- good Lightheadedness- no   Edema- yes   DIABETES Disease Monitoring Blood Sugar ranges-good per pt Polyuria- no New Visual problems- no Medications Compliance- good Hypoglycemic symptoms- no   HYPERLIPIDEMIA Disease Monitoring See symptoms for Hypertension Medications Compliance- unable to take crestor secondary to muscle aches RUQ pain- no  Muscle aches- yes  Pt also needs refills on meds. ---she is moving to [place] and may need refills 1 more month  Pt c/o pain in legs with walking and edema--- no sob or chest pain ROS See HPI above   PMH Smoking Status noted      Review of Systems    as above Objective:   Physical Exam BP 158/70  Pulse 80  Temp(Src) 98.5 F (36.9 C) (Oral)  Wt 204 lb 12.8 oz (92.897 kg)  BMI 35.14 kg/m2  SpO2 97% General appearance: alert, cooperative, appears stated age and no distress Lungs: clear to auscultation bilaterally Heart: S1, S2 normal  + murmur Extremities: + pitting edema-- no calf pain Pulses: 2+ and symmetric Skin: Skin color, texture, turgor normal. No rashes or lesions Lymph nodes: Cervical, supraclavicular, and axillary nodes normal. Psych--- no depression, no anxiety Sensory exam of the foot is normal, tested with the monofilament. Good pulses, no lesions or ulcers, good peripheral pulses.        Assessment & Plan:

## 2013-09-29 NOTE — Assessment & Plan Note (Signed)
con't meds 

## 2013-09-29 NOTE — Assessment & Plan Note (Signed)
Refill pain meds 

## 2013-09-29 NOTE — Assessment & Plan Note (Signed)
Labs due in Dec con't meds

## 2013-09-29 NOTE — Assessment & Plan Note (Signed)
Check labs con't meds 

## 2013-10-03 NOTE — Telephone Encounter (Signed)
To MD for review     KP 

## 2013-10-03 NOTE — Telephone Encounter (Signed)
Spoke w/Dr. Lovell Sheehan assistant. She states Dr. Lovell Sheehan must release the patient and another provider can choose to see her or not. I spoke w/pt and she states she would like the referral canceled because she is moving out of the [place] in November and it would not be enough time to receive treatment.

## 2013-10-10 ENCOUNTER — Ambulatory Visit (HOSPITAL_COMMUNITY): Payer: Medicare Other | Attending: Cardiology

## 2013-10-10 DIAGNOSIS — E785 Hyperlipidemia, unspecified: Secondary | ICD-10-CM | POA: Insufficient documentation

## 2013-10-10 DIAGNOSIS — E119 Type 2 diabetes mellitus without complications: Secondary | ICD-10-CM | POA: Insufficient documentation

## 2013-10-10 DIAGNOSIS — M79609 Pain in unspecified limb: Secondary | ICD-10-CM | POA: Insufficient documentation

## 2013-10-10 DIAGNOSIS — I70219 Atherosclerosis of native arteries of extremities with intermittent claudication, unspecified extremity: Secondary | ICD-10-CM

## 2013-10-10 DIAGNOSIS — I739 Peripheral vascular disease, unspecified: Secondary | ICD-10-CM

## 2013-10-10 DIAGNOSIS — I10 Essential (primary) hypertension: Secondary | ICD-10-CM | POA: Insufficient documentation

## 2013-10-10 DIAGNOSIS — R209 Unspecified disturbances of skin sensation: Secondary | ICD-10-CM | POA: Insufficient documentation

## 2013-10-17 ENCOUNTER — Emergency Department (HOSPITAL_BASED_OUTPATIENT_CLINIC_OR_DEPARTMENT_OTHER)
Admission: EM | Admit: 2013-10-17 | Discharge: 2013-10-17 | Disposition: A | Discharge status: Home / Self Care | Payer: Medicare Other | Attending: Emergency Medicine | Admitting: Emergency Medicine

## 2013-10-17 ENCOUNTER — Encounter (HOSPITAL_BASED_OUTPATIENT_CLINIC_OR_DEPARTMENT_OTHER): Payer: Self-pay | Admitting: Emergency Medicine

## 2013-10-17 DIAGNOSIS — J45909 Unspecified asthma, uncomplicated: Secondary | ICD-10-CM | POA: Insufficient documentation

## 2013-10-17 DIAGNOSIS — Z8742 Personal history of other diseases of the female genital tract: Secondary | ICD-10-CM | POA: Insufficient documentation

## 2013-10-17 DIAGNOSIS — I10 Essential (primary) hypertension: Secondary | ICD-10-CM | POA: Insufficient documentation

## 2013-10-17 DIAGNOSIS — E785 Hyperlipidemia, unspecified: Secondary | ICD-10-CM | POA: Insufficient documentation

## 2013-10-17 DIAGNOSIS — Z8669 Personal history of other diseases of the nervous system and sense organs: Secondary | ICD-10-CM | POA: Insufficient documentation

## 2013-10-17 DIAGNOSIS — K219 Gastro-esophageal reflux disease without esophagitis: Secondary | ICD-10-CM | POA: Insufficient documentation

## 2013-10-17 DIAGNOSIS — L0390 Cellulitis, unspecified: Secondary | ICD-10-CM | POA: Diagnosis present

## 2013-10-17 DIAGNOSIS — IMO0002 Reserved for concepts with insufficient information to code with codable children: Secondary | ICD-10-CM | POA: Insufficient documentation

## 2013-10-17 DIAGNOSIS — Z7982 Long term (current) use of aspirin: Secondary | ICD-10-CM | POA: Insufficient documentation

## 2013-10-17 DIAGNOSIS — L02419 Cutaneous abscess of limb, unspecified: Secondary | ICD-10-CM | POA: Insufficient documentation

## 2013-10-17 DIAGNOSIS — Z862 Personal history of diseases of the blood and blood-forming organs and certain disorders involving the immune mechanism: Secondary | ICD-10-CM | POA: Insufficient documentation

## 2013-10-17 DIAGNOSIS — Z8739 Personal history of other diseases of the musculoskeletal system and connective tissue: Secondary | ICD-10-CM | POA: Insufficient documentation

## 2013-10-17 DIAGNOSIS — Y92009 Unspecified place in unspecified non-institutional (private) residence as the place of occurrence of the external cause: Secondary | ICD-10-CM | POA: Insufficient documentation

## 2013-10-17 DIAGNOSIS — Y939 Activity, unspecified: Secondary | ICD-10-CM | POA: Insufficient documentation

## 2013-10-17 DIAGNOSIS — E119 Type 2 diabetes mellitus without complications: Secondary | ICD-10-CM | POA: Insufficient documentation

## 2013-10-17 DIAGNOSIS — Z79899 Other long term (current) drug therapy: Secondary | ICD-10-CM | POA: Insufficient documentation

## 2013-10-17 DIAGNOSIS — S80811A Abrasion, right lower leg, initial encounter: Secondary | ICD-10-CM

## 2013-10-17 MED ORDER — BACITRACIN 500 UNIT/GM EX OINT: 1.0000 "application " | TOPICAL_OINTMENT | Freq: Two times a day (BID) | CUTANEOUS | Status: AC

## 2013-10-17 MED ORDER — CLINDAMYCIN HCL 150 MG PO CAPS: 450.0000 mg | ORAL_CAPSULE | Freq: Three times a day (TID) | ORAL | Status: AC

## 2013-10-17 MED ORDER — OXYCODONE HCL 5 MG PO TABS: 5.0000 mg | ORAL_TABLET | Freq: Three times a day (TID) | ORAL | Status: AC | PRN

## 2013-10-17 NOTE — ED Provider Notes (Signed)
CSN: [id number]     Arrival date & time 10/17/13  2115 History  This chart was scribed for Stacey Argyle, MD by Blanchard Kelch, ED Scribe. The patient was seen in room MH08/MH08. Patient's care was started at 10:18 PM.     Chief Complaint  Patient presents with  . Leg Injury    Patient is a 72 y.o. female presenting with injury. The history is provided by the patient. No language interpreter was used.  Injury This is a new problem. The current episode started more than 2 days ago. The problem occurs constantly. The problem has not changed since onset.Pertinent negatives include no chest pain. Nothing relieves the symptoms. Treatments tried: Hydrocodone. The treatment provided no relief.    HPI Comments: Stacey Leon is a 72 y.o. female who presents to the Emergency Department due to a right lower leg injury that occurred three days ago when patio furniture fell on her leg. She is complaining of constant, moderate pain and erythema to the affected area. She has been taking her Hydrocodone for her back for the pain but it has not relieved the shin pain. She is ambulatory. She has a past pertinent medical history of diabetes. She states that she usually gets infections when her skin is broken.  Past Medical History  Diagnosis Date  . Diabetes mellitus   . Hyperlipemia   . Osteopenia   . Peripheral neuropathy   . GERD (gastroesophageal reflux disease)   . Iron deficiency anemia   . Hypertension   . Asthma   . Ovarian cyst    Past Surgical History  Procedure Laterality Date  . Vaginal hysterectomy     Family History  Problem Relation Age of Onset  . Colon cancer Maternal Uncle   . Colon cancer Paternal Uncle   . Hypertension Mother   . Anemia Mother   . Lung cancer Father   . Diabetes Sister   . Hypertension Sister     x 2  . Hypertension Brother     x 2  . Arthritis Sister   . Multiple myeloma Brother    History  Substance Use Topics  . Smoking status: Never Smoker    . Smokeless tobacco: Never Used  . Alcohol Use: No   OB History   Grav Para Term Preterm Abortions TAB SAB Ect Mult Living   1 1        1      Review of Systems  Constitutional: Negative for fever and chills.  HENT: Negative for congestion.   Eyes: Negative for pain.  Respiratory: Negative for cough.   Cardiovascular: Negative for chest pain.  Gastrointestinal: Negative for vomiting.  Endocrine: Negative for polyuria.  Genitourinary: Negative for dysuria.  Musculoskeletal: Negative for gait problem.  Skin: Positive for wound.  Allergic/Immunologic: Negative for immunocompromised state.  Neurological: Negative for speech difficulty.  Hematological: Negative for adenopathy.  Psychiatric/Behavioral: Negative for confusion.  All other systems reviewed and are negative.    Allergies  Ampicillin and Simvastatin  Home Medications   Current Outpatient Rx  Name  Route  Sig  Dispense  Refill  . amLODipine (NORVASC) 5 MG tablet      TAKE 1 TABLET DAILY   90 tablet   2   . aspirin 81 MG tablet   Oral   Take 81 mg by mouth daily.           . Azelastine-Fluticasone (DYMISTA) 137-50 MCG/ACT SUSP   Nasal   Place 1 spray  into the nose 2 (two) times daily.         . beclomethasone (QVAR) 80 MCG/ACT inhaler   Inhalation   Inhale 1 puff into the lungs as needed.           . Calcium Carbonate (CALTRATE 600) 1500 MG TABS   Oral   Take by mouth. 1 po qd          . Casanthranol-Docusate Sodium 30-100 MG CAPS   Oral   Take by mouth. 3 caps by mouth daily   Stool softner          . cetirizine (ZYRTEC) 10 MG tablet   Oral   Take 10 mg by mouth daily.           Marland Kitchen co-enzyme Q-10 30 MG capsule   Oral   Take 30 mg by mouth 1 dose over 46 hours.           . ergocalciferol (VITAMIN D2) 50000 UNITS capsule   Oral   Take 1 capsule (50,000 Units total) by mouth once a week.   4 capsule   5   . estradiol (ESTRACE) 1 MG tablet      TAKE 1 TABLET DAILY   90  tablet   2   . FeAsp-FeFum -Suc-C-Thre-B12-FA (MULTIGEN PLUS) 50-101-1 MG TABS      1 po qd   30 each   11   . Hydrocodone-Acetaminophen 7.5-300 MG TABS      TAKE 1 TABLET BY MOUTH EVERY 6 HOURS AS NEEDED   120 each   0   . Inulin (FIBERCHOICE PO)   Oral   Take by mouth. 2 tablets po daily          . JANUVIA 100 MG tablet      TAKE 1 TABLET DAILY   90 tablet   2   . Linaclotide (LINZESS) 290 MCG CAPS   Oral   Take 1 tablet by mouth every morning.   30 capsule   2   . olopatadine (PATANOL) 0.1 % ophthalmic solution   Both Eyes   Place 1 drop into both eyes 1 day or 1 dose.   5 mL   3   . omeprazole (PRILOSEC) 20 MG capsule   Oral   Take 2 capsules (40 mg total) by mouth daily.   180 capsule   3   . Pitavastatin Calcium (LIVALO) 1 MG TABS      1 po qhs   30 tablet   2   . pregabalin (LYRICA) 75 MG capsule   Oral   Take 1 capsule (75 mg total) by mouth 3 (three) times daily.   270 capsule   3   . quinapril (ACCUPRIL) 40 MG tablet      TAKE 1 TABLET BY MOUTH EVERY NIGHT AT BEDTIME   90 tablet   1   . torsemide (DEMADEX) 20 MG tablet      1/2 tab po qd   10 tablet   0   . vitamin E 200 UNIT capsule   Oral   Take 200 Units by mouth daily.           . diazepam (VALIUM) 10 MG tablet      take 1 po 30 min before procedure and take bottle with you   10 tablet   0   . fluocinonide cream (LIDEX) 0.05 %   Topical   Apply 1 application topically 2 (two) times daily.   30  g   5   . glimepiride (AMARYL) 2 MG tablet   Oral   Take 1 tablet (2 mg total) by mouth daily before breakfast.   30 tablet   2   . Hyoscyamine Sulfate 0.375 MG TBCR      Take one tab twice a day as needed for abdominal pain   25 tablet   1   . pirbuterol (MAXAIR) 200 MCG/INH inhaler   Inhalation   Inhale 2 puffs into the lungs 4 (four) times daily.            Triage Vitals: BP 173/64  Pulse 89  Temp(Src) 98.3 F (36.8 C) (Oral)  Resp 18  Ht 5\' 4"  (1.626  m)  Wt 200 lb (90.719 kg)  BMI 34.31 kg/m2  SpO2 98%  Physical Exam  Nursing note and vitals reviewed. Constitutional: She is oriented to person, place, and time. She appears well-developed and well-nourished.  HENT:  Head: Normocephalic and atraumatic.  Mouth/Throat: Oropharynx is clear and moist.  Eyes: Conjunctivae and EOM are normal. Pupils are equal, round, and reactive to light.  Neck: Normal range of motion. Neck supple.  Cardiovascular: Normal rate, regular rhythm, normal heart sounds and intact distal pulses.   Pulmonary/Chest: Effort normal and breath sounds normal. No respiratory distress.  Abdominal: Soft. Bowel sounds are normal. She exhibits no distension and no mass. There is no tenderness. There is no rebound and no guarding.  Musculoskeletal: Normal range of motion. She exhibits no edema and no tenderness.  Neurological: She is alert and oriented to person, place, and time. She has normal strength. No cranial nerve deficit or sensory deficit.  Skin: Skin is warm and dry. No rash noted. There is erythema.  2 cm linear abrasion to right mid shin. Mild surrounding erythema. Normal sensation throughout. 2+ distal pulses.   Psychiatric: She has a normal mood and affect.    ED Course  Procedures (including critical care time)  DIAGNOSTIC STUDIES: Oxygen Saturation is 98% on room air, normal by my interpretation.    COORDINATION OF CARE: 10:20 PM -No clinical suspicion of fracture. Will order ointment, pain medication and antibiotics. Patient verbalizes understanding and agrees with treatment plan.    Labs Review Labs Reviewed - No data to display Imaging Review No results found.  EKG Interpretation   None       MDM   1. Abrasion of right leg, initial encounter   2. Cellulitis    10:46 PM 72 y.o. female who presents with abrasion to right shin which occurred 3 days ago. She notes that the metal base which holds a patio umbrella rolled into her right shin.  She is a diabetic and has had issues with small wounds before. She notes ongoing pain and now redness around the wound. She appears to have a mild abrasion with surrounding erythema. Will provide prescription for bacitracin, clindamycin to prevent infection, and oxycodone for breakthrough pain.  10:48 PM:  I have discussed the diagnosis/risks/treatment options with the patient and believe the pt to be eligible for discharge home to follow-up with pcp in 2-3 days for repeat eval. We also discussed returning to the ED immediately if new or worsening sx occur. We discussed the sx which are most concerning (e.g., spreading redness, fever, worsening pain) that necessitate immediate return. Any new prescriptions provided to the patient are listed below.  Discharge Medication List as of 10/17/2013 10:51 PM    START taking these medications   Details  bacitracin 500  UNIT/GM ointment Apply 1 application topically 2 (two) times daily. Apply topically to the affected area twice daily., Starting 10/17/2013, Until Discontinued, Print    clindamycin (CLEOCIN) 150 MG capsule Take 3 capsules (450 mg total) by mouth 3 (three) times daily., Starting 10/17/2013, Last dose on Thu 10/27/13, Print    oxyCODONE (ROXICODONE) 5 MG immediate release tablet Take 1 tablet (5 mg total) by mouth every 8 (eight) hours as needed (for breakthrough pain)., Starting 10/17/2013, Until Discontinued, Print          I personally performed the services described in this documentation, which was scribed in my presence. The recorded information has been reviewed and is accurate.    Stacey Argyle, MD 10/18/13 [phone]

## 2013-10-17 NOTE — Telephone Encounter (Signed)
Patient is returning a call to Kim from Friday.

## 2013-10-17 NOTE — ED Notes (Signed)
Pt hit right lower shin on furniture at home on Friday and area has gotten progressively painful and red

## 2013-10-18 ENCOUNTER — Ambulatory Visit: Payer: Medicare Other

## 2013-10-18 ENCOUNTER — Telehealth: Payer: Self-pay | Admitting: *Deleted

## 2013-10-18 DIAGNOSIS — M1990 Unspecified osteoarthritis, unspecified site: Secondary | ICD-10-CM

## 2013-10-18 DIAGNOSIS — K219 Gastro-esophageal reflux disease without esophagitis: Secondary | ICD-10-CM

## 2013-10-18 MED ORDER — OMEPRAZOLE 20 MG PO CPDR: 40.0000 mg | DELAYED_RELEASE_CAPSULE | Freq: Every day | ORAL | Status: DC

## 2013-10-18 MED ORDER — OMEPRAZOLE 20 MG PO CPDR: 40.0000 mg | DELAYED_RELEASE_CAPSULE | Freq: Every day | ORAL | Status: AC

## 2013-10-18 MED ORDER — HYDROCODONE-ACETAMINOPHEN 7.5-300 MG PO TABS: ORAL_TABLET | ORAL | Status: AC

## 2013-10-18 NOTE — Telephone Encounter (Signed)
Notes Recorded by Lelon Perla, DO on 10/13/2013 at 1:27 PM normal

## 2013-10-18 NOTE — Telephone Encounter (Signed)
Hydrocodone x1,  prilosec for 1 year

## 2013-10-18 NOTE — Telephone Encounter (Addendum)
Patient stated she was moving to [facility] and Dr.Lowne advised she would give her an Rx before she left. She also wanted the Rx for the prilosec so she could take it to the pharmacy. Ok to fill per [employer]     KP

## 2013-10-18 NOTE — Telephone Encounter (Signed)
Medication will not be filled it was just prescribed on 09/29/13 #120. Omeprazole sent to the pharmacy.     KP

## 2013-10-18 NOTE — Telephone Encounter (Signed)
Patient aware     KP 

## 2013-10-18 NOTE — Addendum Note (Signed)
Addended by: Arnette Norris on: 10/18/2013 02:35 PM   Modules accepted: Orders

## 2013-10-18 NOTE — Telephone Encounter (Signed)
Patient called and requesting refill on her hydrocodone and omeprazole. Patient states that she would a printed copy for both medications.   Last visit: 09/29/2013 Last filled: 09/29/2013 No UDS, contract signed   Please advise. SW, CMA

## 2013-11-03 ENCOUNTER — Other Ambulatory Visit: Payer: Self-pay

## 2013-11-08 ENCOUNTER — Encounter: Payer: Self-pay | Admitting: Family Medicine

## 2013-12-07 ENCOUNTER — Other Ambulatory Visit: Payer: Self-pay

## 2013-12-07 MED ORDER — FLUOCINONIDE 0.05 % EX CREA: 1.0000 "application " | TOPICAL_CREAM | Freq: Two times a day (BID) | CUTANEOUS | Status: AC

## 2013-12-07 MED ORDER — TORSEMIDE 20 MG PO TABS: ORAL_TABLET | ORAL | Status: AC

## 2014-01-27 ENCOUNTER — Encounter

## 2014-03-08 ENCOUNTER — Other Ambulatory Visit: Payer: Self-pay | Admitting: *Deleted

## 2014-03-09 MED ORDER — PREGABALIN 75 MG PO CAPS: 75.0000 mg | ORAL_CAPSULE | Freq: Three times a day (TID) | ORAL | Status: AC

## 2014-03-09 NOTE — Telephone Encounter (Signed)
Refill request for penqabalin Last filled by MD on - 09/15/2013 #270 x3 Last Appt: 09/29/2013 Next Appt: none Patient has had toxicology screening. 09/27/2013 Please advise refill?

## 2014-04-06 ENCOUNTER — Other Ambulatory Visit: Payer: Self-pay

## 2014-07-25 ENCOUNTER — Telehealth: Payer: Self-pay

## 2014-07-25 NOTE — Telephone Encounter (Signed)
LVM for pt to call back and schedule CPE with PCP.   Diabetic Bundle pt.

## 2014-10-30 ENCOUNTER — Encounter (HOSPITAL_BASED_OUTPATIENT_CLINIC_OR_DEPARTMENT_OTHER): Payer: Self-pay | Admitting: Emergency Medicine

## 2015-05-31 ENCOUNTER — Encounter: Payer: Self-pay | Admitting: Gastroenterology

## 2015-09-14 ENCOUNTER — Encounter

## 2015-09-26 ENCOUNTER — Inpatient Hospital Stay: Admit: 2015-09-26 | Payer: MEDICARE | Attending: Physical Medicine & Rehabilitation | Primary: Adolescent Medicine

## 2015-09-26 DIAGNOSIS — M4802 Spinal stenosis, cervical region: Secondary | ICD-10-CM

## 2016-05-02 ENCOUNTER — Ambulatory Visit
Admit: 2016-05-02 | Discharge: 2016-05-02 | Payer: MEDICARE | Attending: Cardiovascular Disease | Primary: Adolescent Medicine

## 2016-05-02 DIAGNOSIS — R079 Chest pain, unspecified: Secondary | ICD-10-CM

## 2016-05-02 NOTE — Progress Notes (Signed)
Reason for Consult: Chest pain, SOB.     Referring: Marlane Hatcher, MD      HPI: Jamie Bryant is a 75 y.o. female with known history of diabetes, hypertension, dyslipidemia is being referred for symptoms of chest pain, shortness of breath, palpitations as well as uncontrolled LDL.  She has been experiencing chest pressure like symptoms mostly anterior chest and epigastric region from last few months.  The symptoms are exertional in nature.  She shifted from a house to an apartment which has stairs and while walking the stairs oftentimes she will feel chest pain nonradiating.  Associated with the symptoms she also describes having dyspnea on exertion as well as palpitations.  There is no symptoms of lightheadedness dizziness.  She has no history of presyncope or syncope.  She had stress test number of years ago.  No history of cardiac catheterization.    Her grandmother had heart disease and irregular heartbeat.  Her granddad had heart disease.  Mom had valve issue possibly a hole in the heart.  Dad had lung cancer.    Labs from her PCPs office including PCPs current note from March 2017 was personally reviewed.  Specifically the hemoglobin is 12.1.  The FOP from March 18, 2016 show a total cholesterol is 252, triglycerides 86, HDL is 65, VLDL 17, and LDL 170, serum sodium is 142, creatinine is 0.72, AST ALT are normal.    EKG did not demonstrate any signs of ischemia.    Plan:    1.  Chest pain: Given significant risk factors personal including family history we will further proceed with a Lexiscan stress nuclear for rule out coronary disease.  Continue current medications including statins, beta-blockers as well as aspirin.  2.  Dyspnea on exertion: Check an echocardiogram to check for cardiac function as well as valvular function.  3.  Palpitations: We will check a stress test as well as echocardiogram.  4.  Dyslipidemia: She has failed statin medications.  She has significant  myopathy with all the statin medications.  I reviewed her lab results and her LDL has been high although stable from last 18 months.  Her HDL has increased from 51-65 in the last 18 months.  Her triglycerides are very low at 86 mg percent.  Since she does not have any established coronary artery disease history or CABG history she will not be approved by the insurance for PCS9 inhibitor.  At this time I encourage her to continue risk modification and tight control of her triglycerides.  5.  Hypertension: Blood pressure is slightly elevated in the office today.  Continue current medications.  Hopefully this is only an isolated reading.  6.  I will see her back in 1 month by which time we should have the results of the stress test and echocardiogram.        No past medical history on file.         No past surgical history on file.          No family history on file.        Social History     Social History   ??? Marital status: MARRIED     Spouse name: N/A   ??? Number of children: N/A   ??? Years of education: N/A     Occupational History   ??? Not on file.     Social History Main Topics   ??? Smoking status: Never Smoker   ??? Smokeless tobacco: Not on file   ???  Alcohol use No   ??? Drug use: Not on file   ??? Sexual activity: Not on file     Other Topics Concern   ??? Not on file     Social History Narrative   ??? No narrative on file         Allergies   Allergen Reactions   ??? Ampicillin Rash   ??? Statins-Hmg-Coa Reductase Inhibitors Myalgia     Lovastatin, pravastatin, simvastatin, atorvastatin, zetia, livalo, lovaza                 ROS:  12 point review of systems was performed. All negative except for HPI     Physical Exam:  Visit Vitals   ??? Ht 5\' 4"  (1.626 m)   ??? Wt 217 lb (98.4 kg)   ??? BMI 37.25 kg/m2       Gen:  Well-developed, well-nourished, in no acute distress  HEENT:  Pink conjunctivae, PERRL, hearing intact to voice, moist mucous membranes  Neck:  Supple, without masses, thyroid non-tender   Resp:  No accessory muscle use, clear breath sounds without wheezes rales or rhonchi  Card:  No murmurs, normal S1, S2 without thrills, bruits or peripheral edema  Abd:  Soft, non-tender, non-distended, normoactive bowel sounds are present, no palpable organomegaly and no detectable hernias  Lymph:  No cervical or inguinal adenopathy  Musc:  No cyanosis or clubbing  Skin:  No rashes or ulcers, skin turgor is good  Neuro:  Cranial nerves are grossly intact, no focal motor weakness, follows commands appropriately  Psych:  Good insight, oriented to person, place and time, alert     Labs:     No results found for: WBC, WBCLT, HGBPOC, HGB, HGBP, HGBEXT, HCTPOC, HCT, HCTEXT, PHCT, RBCH, PLT, PLTEXT, MCV, HGBEXT, HCTEXT, PLTEXT    No results found for: HBA1C, HBA1CEXT, HGBE8, GLU, GESTF, GLUCPOC, MCACR, MCA1, MCA2, MCA3, MCA4, UMCA, MCAU, LDL, DLDL, LDLC, DLDLP, CRES, CREAPOC, MCREA, REFC7, ACREA, CREA, REFC3, REFC4, HBA1CEXT   No results found for: CHOL, CHOLX, CHLST, CHOLV, HDL, LDL, DLDL, LDLC, DLDLP, TGL, TGLX, TRIGL, [id number], TRIGP, CHHD, CHHDX    No results found for: GPT, ALT, SGOT, GGT, GGTP, AP, APIT, APX, CBIL, TBIL, TBILI    No results found for: INR, PTMR, PTP, PT1, PT2   No results found for: CGFR, GFRAA, GFRNA, CRCLT, [id number], CCT, CRES, CREAPOC, MCREA, REFC7, ACREA, CREA, REFC3, REFC4, BUN, BUNPOC, IBUN, MBUNV, BUNV, NAPOC, NA, PNA, WBNA, K, KPOCT, KI, PLK, WBK, CLPOC, PCL, CL, WBCL, CO2, DIG, DIGP, MDIG   No results found for: PSA, Kaleen MaskSA2, PSAR1, PSA1, PSAR2, PSA3, PSAR3, [id number], [id number], PSALT  No results found for: TSH, TSH2, TSH3, TSHP, TSHELE, TSHEXT, TT3, T3U, T3UP, FRT3, FT3, FT4, FT4P, T4, T4P, FT4T, TT7, TSHEXT   No results found for: GLU, GLUCPOC   No results found for: CPK, RCK1, RCK2, RCK3, RCK4, CKMB, CKNDX, CKND1, TROPT, TROIQ, BNPP, BNP   No results found for: BNP, BNPP, BNPPPOC, XBNPT, BNPNT   No results found for: NA, K, CL, CO2, AGAP, GLU, BUN, CREA, BUCR, GFRAA, GFRNA, CA, GFRAA    No results found for: NA, K, CL, CO2, AGAP, GLU, BUN, CREA, BUCR, GFRAA, GFRNA, CA, TBIL, TBILI, GPT, ALT, SGOT, AP, TP, ALB, GLOB, AGRAT   No results found for: HBA1C, HBA1CEXT, HGBE8, HBA1CPOC, HBA1CEXT        No results for input(s): CPK, CKMB, TROIQ in the last 72 hours.    No lab exists for component: CKQMB, CPKMB  Diagnostic Tests:   EKG: normal sinus rhythm, normal ST segment, normal axis, normal intervals.        Jamie Genova K. Dagoberto Reef, MD, [facility]

## 2016-05-15 NOTE — Telephone Encounter (Signed)
Pt needs 1 month follow up set up in late June 2017. Thank you!

## 2016-05-22 NOTE — Telephone Encounter (Signed)
ID verified per protocol. Confirmed appointments for 05-23-16. Arrive @ 1:30 pm. Patient instructed to be NPO x 2 hrs prior, no caffeine (not even decaf coffee,tea,colas) x 12 hrs prior, wear comfortable clothes/good walking shoes. Patient also informed the test takes 3+ hrs. Patient verbalized understanding and agrees with prep/test.

## 2016-05-23 ENCOUNTER — Institutional Professional Consult (permissible substitution): Primary: Adolescent Medicine

## 2016-05-23 ENCOUNTER — Institutional Professional Consult (permissible substitution): Admit: 2016-05-23 | Discharge: 2016-05-23 | Payer: MEDICARE | Primary: Adolescent Medicine

## 2016-05-23 DIAGNOSIS — R079 Chest pain, unspecified: Secondary | ICD-10-CM

## 2016-05-23 MED ORDER — PERFLUTREN LIPID MICROSPHERES 1.1 MG/ML IV
1.1 mg/mL | Freq: Once | INTRAVENOUS | 0 refills | Status: AC
Start: 2016-05-23 — End: 2016-05-23

## 2016-05-23 MED ORDER — REGADENOSON 0.4 MG/5 ML IV SYRINGE
0.4 mg/5 mL | Freq: Once | INTRAVENOUS | 0 refills | Status: AC
Start: 2016-05-23 — End: 2016-05-23

## 2016-05-23 NOTE — Progress Notes (Signed)
The patient was identified by name and date of birth. The test was explained to patient and questions were answered prior to testing.

## 2016-05-23 NOTE — Progress Notes (Signed)
See scanned report. Dr. Rathi ordered study and Dr. Rathi read study. ID verified per protocol. Test and risks explained. Consent signed after all patient questions answered. Patient developed dyspnea during test. At 2 minutes in recovery, patient without symptoms or complaints voiced.

## 2016-05-23 NOTE — Progress Notes (Signed)
Per Dr. Dagoberto Reefathi, utilize Definity. ID verified per protocol. Test and risks explained to patient. Consent signed after all questions answered. Started saline lock in Right ACF started earlier for nuclear study.  Good blood return and flushed without difficulty. At 3:30 PM, activated Definity (1.3 mL in 8.7 ml NS) injected  X 3. (Total 8.4 mLs given). No complaint of voiced. Echo images obtained. Removed saline lock and applied pressure until homeostasis achieved. Dressing applied. Patient instructed to leave dressing on times 1 hr. Verbalized understanding. Patient waited in waiting room until time for nuclear imaging. Patient  without complaints of voiced @ 3:55 pm.

## 2016-05-24 LAB — ECHOCARDIOGRAM COMPLETE 2D W DOPPLER W COLOR: Left Ventricular Ejection Fraction: 65

## 2016-05-24 NOTE — Progress Notes (Signed)
Called and discussed results. Await stress Nuc results.

## 2016-05-27 ENCOUNTER — Encounter: Primary: Adolescent Medicine

## 2016-05-29 MED ORDER — NUCLEAR MEDICINE ISOTOPE
Freq: Once | 0 refills | Status: AC
Start: 2016-05-29 — End: 2016-05-29

## 2016-05-29 NOTE — Telephone Encounter (Signed)
D/w patient.   Stress nuc negative for ischemia.   Hyperdynamic function LVEF 84%

## 2016-06-23 ENCOUNTER — Ambulatory Visit
Admit: 2016-06-23 | Discharge: 2016-06-23 | Payer: MEDICARE | Attending: Cardiovascular Disease | Primary: Adolescent Medicine

## 2016-06-23 DIAGNOSIS — R079 Chest pain, unspecified: Secondary | ICD-10-CM

## 2016-06-23 NOTE — Progress Notes (Signed)
Office Follow-up    NAME: Jamie Bryant   DOB:  12/22/1941  MRM:  [id number]    Date:  06/27/2016            Assessment:     Problem List  Date Reviewed: 06/27/2016    None             Plan:     1. Stress test is negative for ischemia.  No further evaluation is recommended at this time for her atypical chest pain.  2. Dyspnea on exertion and palpitations are currently at baseline and would like to observe for doing any further testing.  Her stress test and echocardiogram were normal.  3. Dyslipidemia: She is unable to tolerate statins.  She is not a candidate for PCS 9 inhibitor.  At this time best option is to continue dietary control.  4. Hypertension: Blood pressure controlled continue current medications.  5. Follow-up with me in 1 year                      Subjective:     Jamie Bryant, a 75 y.o. year-old who presents for followup of her previous visit for chest pain, dyspnea on exertion and palpitations.  She underwent Lexiscan stress test which was normal.  Her echocardiogram also was normal with LVEF of 65% with mild MR and mild TR.  Her symptoms of chest pain has improved.    Exam:     Physical Exam:  Visit Vitals   ??? BP 118/66   ??? Pulse 72   ??? Resp 16   ??? Ht 5\' 4"  (1.626 m)   ??? Wt 216 lb (98 kg)   ??? SpO2 95%   ??? BMI 37.08 kg/m2     General appearance - alert, well appearing, and in no distress  Mental status - affect appropriate to mood  Eyes - sclera anicteric, moist mucous membranes  Neck - supple, no significant adenopathy  Chest - clear to auscultation, no wheezes, rales or rhonchi  Heart - normal rate, regular rhythm, normal S1, S2, no murmurs, rubs, clicks or gallops  Abdomen - soft, nontender, nondistended, no masses or organomegaly  Extremities - peripheral pulses normal, no pedal edema  Skin - normal coloration  no rashes    Medications:     Current Outpatient Prescriptions   Medication Sig   ??? pregabalin (LYRICA) 150 mg capsule Take 150 mg by mouth three (3) times daily.    ??? omeprazole (PRILOSEC) 40 mg capsule Take 40 mg by mouth daily.   ??? HYDROcodone-acetaminophen (NORCO) 10-325 mg tablet Take 1 Tab by mouth three (3) times daily.   ??? torsemide (DEMADEX) 20 mg tablet Take  by mouth daily.   ??? linaclotide (LINZESS) 290 mcg cap capsule Take  by mouth Daily (before breakfast).   ??? AMLODIPINE BESYLATE, BULK, Take 5 mg by mouth daily.   ??? estradiol (ESTRACE) 1 mg tablet Take 1 mg by mouth daily.   ??? quinapril (ACCUPRIL) 40 mg tablet Take 40 mg by mouth nightly.   ??? SITagliptin (JANUVIA) 100 mg tablet Take 100 mg by mouth daily.   ??? cholecalciferol, VITAMIN D3, (VITAMIN D3) 5,000 unit tab tablet Take  by mouth daily.   ??? co-enzyme Q-10 (COQ-10) 100 mg capsule Take 100 mg by mouth daily.   ??? aspirin 81 mg chewable tablet Take 81 mg by mouth daily.   ??? MV,CAL,MIN/IRON/FOLIC ACID/LUT (COMPLETE MULTI PO) Take  by mouth.   ???  beclomethasone (QVAR) 40 mcg/actuation aero Take 1 Puff by inhalation two (2) times a day.   ??? TRIAMCINOLONE ACETONIDE (NASACORT NA) by Nasal route.   ??? olopatadine (PATANOL) 0.1 % ophthalmic solution Administer 2 Drops to both eyes two (2) times a day.   ??? DOCUSATE CALCIUM (STOOL SOFTENER PO) Take  by mouth three (3) times daily as needed.     No current facility-administered medications for this visit.       Diagnostic Data Review:       05/23/16: LEXISCAN- Normal test. No ischemia, LVEF 84%.  05/23/16: ECHO- LVEF 65%, G1DD; LA mild dialted; MV mod annular calcification, mild MR; AV leaflet sclerosis; Mild TR      Lab Review:   No results found for: CHOL, CHOLX, CHLST, CHOLV, [id number], HDL, LDL, LDLC, DLDLP, TGLX, TRIGL, TRIGP, CHHD, CHHDX  No results found for: CRES, CREAPOC, MCREA, ACREA, CREA, REFC3, REFC4  No results found for: BUN, BUNPOC, IBUN, MBUNV, BUNV  No results found for: K, KI, PLK, WBK  No results found for: HBA1C, HGBE8, HBA1CEXT  No results found for: HGBPOC, HGB, HGBP, HGBEXT  No results found for: PLT, PLTEXT   No results for input(s): CPK, CKMB, TROIQ in the last 72 hours.    No lab exists for component: CKQMB, CPKMB             ___________________________________________________    Rod CanVikas K. Dagoberto Reefathi, MD, [facility]

## 2016-06-23 NOTE — Progress Notes (Signed)
Chief Complaint   Patient presents with   ??? Chest Pain     One month follow up   ??? Breathing Problem     Dyspnea on exertion   ??? Palpitations   ??? Cholesterol Problem     Dyslipidemia    ??? Hypertension        Visit Vitals   ??? BP 118/66   ??? Pulse 72   ??? Resp 16   ??? Ht 5\' 4"  (1.626 m)   ??? Wt 216 lb (98 kg)   ??? SpO2 95%   ??? BMI 37.08 kg/m2

## 2017-06-29 ENCOUNTER — Encounter: Attending: Cardiovascular Disease | Primary: Adolescent Medicine

## 2018-01-13 ENCOUNTER — Emergency Department: Admit: 2018-01-14 | Payer: MEDICARE | Primary: Adolescent Medicine

## 2018-01-13 DIAGNOSIS — B349 Viral infection, unspecified: Secondary | ICD-10-CM

## 2018-01-13 NOTE — ED Triage Notes (Addendum)
Pt reports generalized body aches, sore throat, headache, and chills. Diarrhea since Monday. Decreased  Urine output. Recent exposure to flu.  Seen by PCP today and had negative flu test.

## 2018-01-13 NOTE — ED Notes (Signed)
Discharged by provider.

## 2018-01-13 NOTE — ED Provider Notes (Signed)
77 y.o. female with no significant past medical history who presents ambulatory from home with chief complaint of generalized body aches. Patient reports her granddaughter was recently diagnosed with the flu - arrives c/o sore throat, HA, generalized body aches, decreased appetite, dry cough, and diarrhea onset 2 days ago. Patient states, "every part of my body is sore." Patient also c/o nausea and chills, but denies vomiting and fever. Patient notes she is not voiding as much as usual. Patient was referred from Dr. Carilyn Goodpasture office [facility]) where she was evaluated just PTA.    There are no other acute medical concerns at this time.    Social hx: Never tobacco smoker; Denies EtOH use; Denies illicit drug use  PCP: Marlane Hatcher, MD    Note written by Shelly Bombard, Scribe, as dictated by Catalina Pizza, MD 7:25 PM             No past medical history on file.    No past surgical history on file.      No family history on file.    Social History     Socioeconomic History   ??? Marital status: MARRIED     Spouse name: Not on file   ??? Number of children: Not on file   ??? Years of education: Not on file   ??? Highest education level: Not on file   Social Needs   ??? Financial resource strain: Not on file   ??? Food insecurity - worry: Not on file   ??? Food insecurity - inability: Not on file   ??? Transportation needs - medical: Not on file   ??? Transportation needs - non-medical: Not on file   Occupational History   ??? Not on file   Tobacco Use   ??? Smoking status: Never Smoker   ??? Smokeless tobacco: Never Used   Substance and Sexual Activity   ??? Alcohol use: No   ??? Drug use: Not on file   ??? Sexual activity: Not on file   Other Topics Concern   ??? Not on file   Social History Narrative   ??? Not on file         ALLERGIES: Ampicillin and Statins-hmg-coa reductase inhibitors    Review of Systems   Constitutional: Positive for appetite change (decreased) and chills. Negative for fever.        +generalized body aches    HENT: Positive for sore throat.    Respiratory: Positive for cough (dry).    Gastrointestinal: Positive for diarrhea and nausea. Negative for vomiting.   Genitourinary: Positive for difficulty urinating.   Neurological: Positive for headaches.   All other systems reviewed and are negative.      There were no vitals filed for this visit.         Physical Exam   Constitutional: She appears well-developed and well-nourished.   Mildly ill-appearing   HENT:   Head: Normocephalic and atraumatic.   Eyes: Conjunctivae are normal.   Neck: Neck supple. No tracheal deviation present.   Cardiovascular: Normal rate, regular rhythm, normal heart sounds and intact distal pulses. Exam reveals no gallop and no friction rub.   No murmur heard.  Pulmonary/Chest: Effort normal and breath sounds normal.   Abdominal: Soft. There is no tenderness.   Musculoskeletal: She exhibits no edema or deformity.   Neurological: She is alert.   oriented   Skin: Skin is warm and dry.   Psychiatric: She has a normal mood and affect.  Nursing note and vitals reviewed.       MDM       Procedures    Progress Note:  Results, treatment, and follow up plan have been discussed with patient.  Questions were answered.   Catalina Pizzaichard R Reeya Bound, MD  8:51 PM    A/P: myalgias, sore throat, HA, cough, decreased appetite, D - suspect viral illness; reassuring appearance and exam; VSS; CBC, CMP, CXR ok; IVF's in ED; rest, fl;uids for home PCP f/u.  Catalina Pizzaichard R Ranald Alessio, MD  8:53 PM

## 2018-01-14 ENCOUNTER — Inpatient Hospital Stay
Admit: 2018-01-14 | Discharge: 2018-01-14 | Disposition: A | Discharge status: Home / Self Care | Payer: MEDICARE | Attending: Emergency Medicine

## 2018-01-14 LAB — CBC WITH AUTOMATED DIFF
ABS. BASOPHILS: 0 10*3/uL (ref 0.0–0.1)
ABS. EOSINOPHILS: 0 10*3/uL (ref 0.0–0.4)
ABS. IMM. GRANS.: 0 10*3/uL (ref 0.00–0.04)
ABS. LYMPHOCYTES: 2.3 10*3/uL (ref 0.8–3.5)
ABS. MONOCYTES: 0.7 10*3/uL (ref 0.0–1.0)
ABS. NEUTROPHILS: 3.6 10*3/uL (ref 1.8–8.0)
ABSOLUTE NRBC: 0 10*3/uL (ref 0.00–0.01)
BASOPHILS: 1 % (ref 0–1)
EOSINOPHILS: 0 % (ref 0–7)
HCT: 41.9 % (ref 35.0–47.0)
HGB: 13.1 g/dL (ref 11.5–16.0)
IMMATURE GRANULOCYTES: 0 % (ref 0.0–0.5)
LYMPHOCYTES: 34 % (ref 12–49)
MCH: 25.9 PG — ABNORMAL LOW (ref 26.0–34.0)
MCHC: 31.3 g/dL (ref 30.0–36.5)
MCV: 82.8 FL (ref 80.0–99.0)
MONOCYTES: 10 % (ref 5–13)
MPV: 12 FL (ref 8.9–12.9)
NEUTROPHILS: 55 % (ref 32–75)
NRBC: 0 PER 100 WBC
PLATELET: 334 10*3/uL (ref 150–400)
RBC: 5.06 M/uL (ref 3.80–5.20)
RDW: 14.5 % (ref 11.5–14.5)
WBC: 6.6 10*3/uL (ref 3.6–11.0)

## 2018-01-14 LAB — SAMPLES BEING HELD

## 2018-01-14 LAB — METABOLIC PANEL, COMPREHENSIVE
A-G Ratio: 0.9 — ABNORMAL LOW (ref 1.1–2.2)
ALT (SGPT): 22 U/L (ref 12–78)
AST (SGOT): 32 U/L (ref 15–37)
Albumin: 3.6 g/dL (ref 3.5–5.0)
Alk. phosphatase: 68 U/L (ref 45–117)
Anion gap: 13 mmol/L (ref 5–15)
BUN/Creatinine ratio: 17 (ref 12–20)
BUN: 36 MG/DL — ABNORMAL HIGH (ref 6–20)
Bilirubin, total: 0.3 MG/DL (ref 0.2–1.0)
CO2: 24 mmol/L (ref 21–32)
Calcium: 9.4 MG/DL (ref 8.5–10.1)
Chloride: 99 mmol/L (ref 97–108)
Creatinine: 2.11 MG/DL — ABNORMAL HIGH (ref 0.55–1.02)
GFR est AA: 28 mL/min/{1.73_m2} — ABNORMAL LOW (ref >60)
GFR est non-AA: 23 mL/min/{1.73_m2} — ABNORMAL LOW (ref >60)
Globulin: 3.9 g/dL (ref 2.0–4.0)
Glucose: 119 mg/dL — ABNORMAL HIGH (ref 65–100)
Potassium: 3.7 mmol/L (ref 3.5–5.1)
Protein, total: 7.5 g/dL (ref 6.4–8.2)
Sodium: 136 mmol/L (ref 136–145)

## 2018-01-14 MED ORDER — BENZONATATE 100 MG CAP
100 mg | ORAL_CAPSULE | Freq: Three times a day (TID) | ORAL | 0 refills | Status: AC | PRN
Start: 2018-01-14 — End: 2018-01-20

## 2018-01-14 MED ORDER — SODIUM CHLORIDE 0.9% BOLUS IV
0.9 % | Freq: Once | INTRAVENOUS | Status: AC
Start: 2018-01-14 — End: 2018-01-13
  Administered 2018-01-14: 02:00:00 via INTRAVENOUS

## 2018-01-14 MED FILL — SODIUM CHLORIDE 0.9 % IV: INTRAVENOUS | Qty: 1000

## 2019-08-13 ENCOUNTER — Encounter

## 2019-08-19 ENCOUNTER — Inpatient Hospital Stay: Admit: 2019-08-19 | Payer: MEDICARE | Primary: Adolescent Medicine

## 2019-08-19 DIAGNOSIS — M5126 Other intervertebral disc displacement, lumbar region: Secondary | ICD-10-CM

## 2019-10-11 ENCOUNTER — Encounter: Attending: Family Medicine | Primary: Adolescent Medicine

## 2019-11-02 ENCOUNTER — Encounter: Payer: BLUE CROSS/BLUE SHIELD | Attending: Family Medicine | Primary: Adolescent Medicine

## 2019-11-04 ENCOUNTER — Ambulatory Visit: Attending: Family Medicine | Primary: Adolescent Medicine

## 2019-11-04 ENCOUNTER — Ambulatory Visit: Admit: 2019-11-04 | Payer: MEDICARE | Attending: Family Medicine | Primary: Adolescent Medicine

## 2019-11-04 ENCOUNTER — Inpatient Hospital Stay: Admit: 2019-11-04 | Payer: MEDICARE | Primary: Adolescent Medicine

## 2019-11-04 DIAGNOSIS — R4781 Slurred speech: Secondary | ICD-10-CM

## 2019-11-04 LAB — ASPIRIN TEST
Aspirin test: 481 {ARU}
Aspirn-platlelet function: 481 {ARU}

## 2019-11-04 NOTE — Progress Notes (Signed)
Chief Complaint   Patient presents with   ??? Neurologic Problem   ??? Memory Loss       Referred by: Primary care      HPI    Jamie Bryant is a 78 year old woman with multiple risk factors for stroke to include diabetes, hypertension, hyperlipidemia here for various symptoms.  She tells me she lives in independent living and around September she became acutely ill with nausea vomiting and diarrhea.  She was taken to the hospital because she felt the symptoms and was extremely weak.  She was in the hospital for 6 days she tells me.  At the same time she was having chest pressures and shortness of breath which still persists now.  She still feels weak but is doing a little better.  She has had to use a Rollator since she left the hospital in September.  She also feels her speech is not normal that it is a little bit more slurred and heavy.  She feels like her tongue is not working right.  She tells me she is having more drooling.  No hallucinations.  She has slowed down and she feels like her left side does not work as well for some reason.  She is left-hand dominant.  No difficulty swallowing.  No vivid dreams or nightmares.  She takes a baby aspirin long-term now for many years due to her diabetes.      Review of Systems   Eyes: Negative for double vision.   Gastrointestinal: Negative for constipation.   Neurological: Positive for speech change and weakness.   Psychiatric/Behavioral: Negative for hallucinations.   All other systems reviewed and are negative.      Past Medical History:   Diagnosis Date   ??? Asthma    ??? Diabetes ([place])    ??? Fibromyalgia    ??? Hyperlipemia    ??? Hypertension      History reviewed. No pertinent family history.  Social History     Socioeconomic History   ??? Marital status: DIVORCED     Spouse name: Not on file   ??? Number of children: Not on file   ??? Years of education: Not on file   ??? Highest education level: Not on file   Occupational History   ??? Not on file   Social Needs   ??? Financial resource  strain: Not on file   ??? Food insecurity     Worry: Not on file     Inability: Not on file   ??? Transportation needs     Medical: Not on file     Non-medical: Not on file   Tobacco Use   ??? Smoking status: Never Smoker   ??? Smokeless tobacco: Never Used   Substance and Sexual Activity   ??? Alcohol use: No   ??? Drug use: No   ??? Sexual activity: Not on file   Lifestyle   ??? Physical activity     Days per week: Not on file     Minutes per session: Not on file   ??? Stress: Not on file   Relationships   ??? Social Product manager on phone: Not on file     Gets together: Not on file     Attends religious service: Not on file     Active member of club or organization: Not on file     Attends meetings of clubs or organizations: Not on file     Relationship status: Not  on file   ??? Intimate partner violence     Fear of current or ex partner: Not on file     Emotionally abused: Not on file     Physically abused: Not on file     Forced sexual activity: Not on file   Other Topics Concern   ??? Not on file   Social History Narrative   ??? Not on file     Current Outpatient Medications   Medication Sig   ??? pregabalin (LYRICA) 150 mg capsule Take 150 mg by mouth three (3) times daily.   ??? omeprazole (PRILOSEC) 40 mg capsule Take 40 mg by mouth daily.   ??? HYDROcodone-acetaminophen (NORCO) 10-325 mg tablet Take 1 Tab by mouth three (3) times daily.   ??? torsemide (DEMADEX) 20 mg tablet Take  by mouth daily.   ??? linaclotide (LINZESS) 290 mcg cap capsule Take  by mouth Daily (before breakfast).   ??? AMLODIPINE BESYLATE, BULK, Take 5 mg by mouth daily.   ??? estradiol (ESTRACE) 1 mg tablet Take 1 mg by mouth daily.   ??? quinapril (ACCUPRIL) 40 mg tablet Take 40 mg by mouth nightly.   ??? SITagliptin (JANUVIA) 100 mg tablet Take 100 mg by mouth daily.   ??? cholecalciferol, VITAMIN D3, (VITAMIN D3) 5,000 unit tab tablet Take  by mouth daily.   ??? co-enzyme Q-10 (COQ-10) 100 mg capsule Take 100 mg by mouth daily.   ??? aspirin 81 mg chewable tablet Take 81  mg by mouth daily.   ??? MV,CAL,MIN/IRON/FOLIC ACID/LUT (COMPLETE MULTI PO) Take  by mouth.   ??? beclomethasone (QVAR) 40 mcg/actuation aero Take 1 Puff by inhalation two (2) times a day.   ??? TRIAMCINOLONE ACETONIDE (NASACORT NA) by Nasal route.   ??? olopatadine (PATANOL) 0.1 % ophthalmic solution Administer 2 Drops to both eyes two (2) times a day.   ??? DOCUSATE CALCIUM (STOOL SOFTENER PO) Take  by mouth three (3) times daily as needed.     No current facility-administered medications for this visit.      Allergies   Allergen Reactions   ??? Ampicillin Rash   ??? Statins-Hmg-Coa Reductase Inhibitors Myalgia     Lovastatin, pravastatin, simvastatin, atorvastatin, zetia, livalo, lovaza         Neurologic Exam     Mental Status   Oriented to person, place, and time.     Cranial Nerves   Cranial nerves II through XII intact.   Somewhat masklike face with reduced blink rate speech is soft slow a little bit slurred     Motor Exam     Strength   Strength 5/5 throughout.     Sensory Exam   Light touch normal.     Gait, Coordination, and Reflexes     Gait  Gait: (Gait is good tempo with some very slight shuffling of her foot catching she uses a Rollator with good turns)No resting tremor elicited no cogwheel but she does seem a little bradykinetic     Physical Exam   Constitutional: She is oriented to person, place, and time. She appears well-developed and well-nourished.   Cardiovascular: Normal rate.   Pulmonary/Chest: Effort normal.   Neurological: She is oriented to person, place, and time. She has normal strength.   Skin: Skin is warm and dry.   Vitals reviewed.    Visit Vitals  BP 118/80   Pulse 78   Resp 16   Ht 5' 4" (1.626 m)   Wt 88 kg (194 lb)  SpO2 95%   BMI 33.30 kg/m??       Lab Results   Component Value Date/Time    WBC 6.6 01/13/2018 07:38 PM    HGB 13.1 01/13/2018 07:38 PM    HCT 41.9 01/13/2018 07:38 PM    PLATELET 334 01/13/2018 07:38 PM    MCV 82.8 01/13/2018 07:38 PM     Lab Results   Component Value Date/Time     ALT (SGPT) 22 01/13/2018 07:38 PM    Alk. phosphatase 68 01/13/2018 07:38 PM    Bilirubin, total 0.3 01/13/2018 07:38 PM    Albumin 3.6 01/13/2018 07:38 PM    Protein, total 7.5 01/13/2018 07:38 PM    PLATELET 334 01/13/2018 07:38 PM        CT Results (maximum last 3):  Results from [place] encounter on 08/19/19   CT SPINE LUMB WO CONT    Narrative INDICATION:  Lower back pain without myelopathy     EXAM: CT lumbar spine.  No comparisons.    Thin section axial images were obtained. From these sagittal and coronal  reformats were performed. CT dose reduction was achieved through use of a  standardized protocol tailored for this examination and automatic exposure  control for dose modulation.     FINDINGS: There is 5 mm anterolisthesis of L4 on L5. Bones are osteopenic. No  fracture. There are endplate degenerative changes.    There are vascular calcifications    L1-L2: No stenosis    L2-L3: There are facet degenerative changes with slight disc height loss and a  small bulge. There is mild canal and subarticular zone narrowing    L3-4: There are bilateral facet degenerative changes with thickening of the  ligamentum flavum and a small bulge. This mildly narrows the canal and  subarticular zones    L4-5: There is disc height loss with vacuum phenomenon. There is a pseudobulge  due to the anterolisthesis with bilateral facet arthropathy and thickening of  the ligamentum flavum. Canal stenosis is moderate to severe with narrowing of  the subarticular zones. Mild to moderate right and mild left foraminal narrowing    L5-S1: There is near-complete disc height loss with vacuum phenomenon. There is  a small bulge. This results in mild narrowing of the canal      Impression IMPRESSION:  1. Multilevel degenerative change detailed by level above most significant at  L4-L5           MRI Results (maximum last 3):  Results from [facility] encounter on 09/26/15   MRI CERV SPINE WO CONT    Narrative EXAM:  MRI  CERV SPINE WO CONT    INDICATION:   Cervical spinal stenosis    COMPARISON: None.    TECHNIQUE: MR imaging of the cervical spine was performed including sagittal T1,  T2, FIR;  axial 3D BASG, T1.  Contrast was not administered.    FINDINGS:  There is minimal reversal of curvature centered at C5. There is no significant  listhesis.. Vertebral body heights are maintained. Marrow signal is normal.  The  craniocervical junction is intact.  The course, caliber, and signal intensity of  the spinal cord are normal.      C2/3:  There is no spinal stenosis. The neural foramen are not well evaluated  secondary to the axial images starting below the level of the disc space. These  however appear relatively normal on sagittal imaging.    C3/4:  Mild disc space narrowing. Mild broad-based disc  osteophyte complex with  thickening of ligamentum flavum creating no significant spinal stenosis. There  is mild right and moderate left neural foraminal narrowing. There is mild  bilateral neural foraminal narrowing.    C4/5:  Mild disc space narrowing. Mild mild broad-based disc osteophyte complex  worsened centrally contacting the anterior aspect of the cord and creating  minimal spinal stenosis. There is mild left and moderate right neural foraminal  narrowing secondary to uncal spurring.    C5/6:  Moderate disc space narrowing. Mild broad-based disc osteophyte complex  contacting the anterior aspect the cord and causing mild spinal stenosis. There  is no significant neural foraminal narrowing.    C6/7:  Moderate disc space narrowing. Mild central and left paracentral disc  osteophyte complex with thickening of ligamentum flavum causing mild spinal  stenosis with relatively greater impingement left lateral recess. There is  moderate left neural foraminal narrowing.     C7/T1:  Moderate disc space narrowing. Minimal broad-based disc osteophyte  complex without significant spinal stenosis. There is mild bilateral neural  foraminal  narrowing.      Impression IMPRESSION:    Multilevel spondylosis as above. Mild spinal stenosis at C5-6 and C6-7. Minimal  spinal stenosis at C4-5. Bilateral multilevel neural foraminal narrowing as  above.         PET Results (maximum last 3):  No results found for this or any previous visit.      Assessment and Plan   Diagnoses and all orders for this visit:    1. Slurred speech  -     CTA HEAD NECK W CONT; Future  -     ACETYLCHOLINE RECEPTOR PANEL  -     ASPIRIN TEST; Future  -     TSH AND FREE T4    2. 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  I do not have any records to review.  It sounds like she never returned to her baseline fully.  Her speech is slurred here in the office and she is very slow moving.  She appears to have some type of parkinsonism which would be unusual to begin suddenly.  Stroke is possible.  I am going to check a CTA of the head and neck since she will not tolerate an MRI.  Check an aspirin level.  Other possibility is some type of neuromuscular disease given the shortness of breath with exertion so I am going to check an acetylcholine panel.  Its not quite clear what is going on at this point.  I am going to have her follow-up in about 6 weeks with myself or the NP if I have no availability.  Stay on the aspirin.  If imaging is unrevealing and the acetylcholine testing is negative and she seems more parkinsonian next visit we should consider starting Sinemet.  Questions answered.      I reviewed and decided to continue the current medications.  This clinical note was dictated with an electronic dictation software that can make unintentional errors.  If there are any questions, please contact me directly for  clarification.      A notice of this visit/encounter being completed has been sent electronically to the patient's PCP and/or referring provider.     [place], [place]  Diplomate ABPN

## 2019-11-04 NOTE — Progress Notes (Signed)
I gave patient this information and mailed her these results, per her request.

## 2019-11-04 NOTE — Progress Notes (Signed)
Called and LVM for the patient to call back.

## 2019-11-04 NOTE — Progress Notes (Signed)
Blood work looks good.  Normal thyroid.  No evidence of neuromuscular disease.

## 2019-11-04 NOTE — Progress Notes (Signed)
I gave patient this information and mailed her these results, per her request.

## 2019-11-04 NOTE — Progress Notes (Signed)
Ms. Kilbride presents as a new patient for evaluation of changes in speech and memory loss. Depression screening done on patient.

## 2019-11-04 NOTE — Progress Notes (Signed)
Chief Complaint   Patient presents with   ??? Neurologic Problem   ??? Memory Loss       Referred by: Primary care      HPI    Jamie Bryant is a 78 year old woman with multiple risk factors for stroke to include diabetes, hypertension, hyperlipidemia here for various symptoms.  Jamie Bryant tells me Jamie Bryant lives in independent living and around September Jamie Bryant became acutely ill with nausea vomiting and diarrhea.  Jamie Bryant was taken to the hospital because Jamie Bryant felt the symptoms and was extremely weak.  Jamie Bryant was in the hospital for 6 days Jamie Bryant tells me.  At the same time Jamie Bryant was having chest pressures and shortness of breath which still persists now.  Jamie Bryant still feels weak but is doing a little better.  Jamie Bryant has had to use a Rollator since Jamie Bryant left the hospital in September.  Jamie Bryant also feels her speech is not normal that it is a little bit more slurred and heavy.  Jamie Bryant feels like her tongue is not working right.  Jamie Bryant tells me Jamie Bryant is having more drooling.  No hallucinations.  Jamie Bryant has slowed down and Jamie Bryant feels like her left side does not work as well for some reason.  Jamie Bryant is left-hand dominant.  No difficulty swallowing.  No vivid dreams or nightmares.  Jamie Bryant takes a baby aspirin long-term now for many years due to her diabetes.      Review of Systems   Eyes: Negative for double vision.   Gastrointestinal: Negative for constipation.   Neurological: Positive for speech change and weakness.   Psychiatric/Behavioral: Negative for hallucinations.   All other systems reviewed and are negative.      Past Medical History:   Diagnosis Date   ??? Asthma    ??? Diabetes (HCC)    ??? Fibromyalgia    ??? Hyperlipemia    ??? Hypertension      History reviewed. No pertinent family history.  Social History     Socioeconomic History   ??? Marital status: DIVORCED     Spouse name: Not on file   ??? Number of children: Not on file   ??? Years of education: Not on file   ??? Highest education level: Not on file   Occupational History   ??? Not on file   Social Needs    ??? Financial resource strain: Not on file   ??? Food insecurity     Worry: Not on file     Inability: Not on file   ??? Transportation needs     Medical: Not on file     Non-medical: Not on file   Tobacco Use   ??? Smoking status: Never Smoker   ??? Smokeless tobacco: Never Used   Substance and Sexual Activity   ??? Alcohol use: No   ??? Drug use: No   ??? Sexual activity: Not on file   Lifestyle   ??? Physical activity     Days per week: Not on file     Minutes per session: Not on file   ??? Stress: Not on file   Relationships   ??? Social Wellsite geologist on phone: Not on file     Gets together: Not on file     Attends religious service: Not on file     Active member of club or organization: Not on file     Attends meetings of clubs or organizations: Not on file     Relationship status: Not  on file   ??? Intimate partner violence     Fear of current or ex partner: Not on file     Emotionally abused: Not on file     Physically abused: Not on file     Forced sexual activity: Not on file   Other Topics Concern   ??? Not on file   Social History Narrative   ??? Not on file     Current Outpatient Medications   Medication Sig   ??? pregabalin (LYRICA) 150 mg capsule Take 150 mg by mouth three (3) times daily.   ??? omeprazole (PRILOSEC) 40 mg capsule Take 40 mg by mouth daily.   ??? HYDROcodone-acetaminophen (NORCO) 10-325 mg tablet Take 1 Tab by mouth three (3) times daily.   ??? torsemide (DEMADEX) 20 mg tablet Take  by mouth daily.   ??? linaclotide (LINZESS) 290 mcg cap capsule Take  by mouth Daily (before breakfast).   ??? AMLODIPINE BESYLATE, BULK, Take 5 mg by mouth daily.   ??? estradiol (ESTRACE) 1 mg tablet Take 1 mg by mouth daily.   ??? quinapril (ACCUPRIL) 40 mg tablet Take 40 mg by mouth nightly.   ??? SITagliptin (JANUVIA) 100 mg tablet Take 100 mg by mouth daily.   ??? cholecalciferol, VITAMIN D3, (VITAMIN D3) 5,000 unit tab tablet Take  by mouth daily.   ??? co-enzyme Q-10 (COQ-10) 100 mg capsule Take 100 mg by mouth daily.    ??? aspirin 81 mg chewable tablet Take 81 mg by mouth daily.   ??? MV,CAL,MIN/IRON/FOLIC ACID/LUT (COMPLETE MULTI PO) Take  by mouth.   ??? beclomethasone (QVAR) 40 mcg/actuation aero Take 1 Puff by inhalation two (2) times a day.   ??? TRIAMCINOLONE ACETONIDE (NASACORT NA) by Nasal route.   ??? olopatadine (PATANOL) 0.1 % ophthalmic solution Administer 2 Drops to both eyes two (2) times a day.   ??? DOCUSATE CALCIUM (STOOL SOFTENER PO) Take  by mouth three (3) times daily as needed.     No current facility-administered medications for this visit.      Allergies   Allergen Reactions   ??? Ampicillin Rash   ??? Statins-Hmg-Coa Reductase Inhibitors Myalgia     Lovastatin, pravastatin, simvastatin, atorvastatin, zetia, livalo, lovaza         Neurologic Exam     Mental Status   Oriented to person, place, and time.     Cranial Nerves   Cranial nerves II through XII intact.   Somewhat masklike face with reduced blink rate speech is soft slow a little bit slurred     Motor Exam     Strength   Strength 5/5 throughout.     Sensory Exam   Light touch normal.     Gait, Coordination, and Reflexes     Gait  Gait: (Gait is good tempo with some very slight shuffling of her foot catching Jamie Bryant uses a Rollator with good turns)No resting tremor elicited no cogwheel but Jamie Bryant does seem a little bradykinetic     Physical Exam   Constitutional: Jamie Bryant is oriented to person, place, and time. Jamie Bryant appears well-developed and well-nourished.   Cardiovascular: Normal rate.   Pulmonary/Chest: Effort normal.   Neurological: Jamie Bryant is oriented to person, place, and time. Jamie Bryant has normal strength.   Skin: Skin is warm and dry.   Vitals reviewed.    Visit Vitals  BP 118/80   Pulse 78   Resp 16   Ht '5\' 4"'$  (1.626 m)   Wt 88 kg (194 lb)  SpO2 95%   BMI 33.30 kg/m??       Lab Results   Component Value Date/Time    WBC 6.6 01/13/2018 07:38 PM    HGB 13.1 01/13/2018 07:38 PM    HCT 41.9 01/13/2018 07:38 PM    PLATELET 334 01/13/2018 07:38 PM    MCV 82.8 01/13/2018 07:38 PM      Lab Results   Component Value Date/Time    ALT (SGPT) 22 01/13/2018 07:38 PM    Alk. phosphatase 68 01/13/2018 07:38 PM    Bilirubin, total 0.3 01/13/2018 07:38 PM    Albumin 3.6 01/13/2018 07:38 PM    Protein, total 7.5 01/13/2018 07:38 PM    PLATELET 334 01/13/2018 07:38 PM        CT Results (maximum last 3):  Results from [place] encounter on 08/19/19   CT SPINE LUMB WO CONT    Narrative INDICATION:  Lower back pain without myelopathy     EXAM: CT lumbar spine.  No comparisons.    Thin section axial images were obtained. From these sagittal and coronal  reformats were performed. CT dose reduction was achieved through use of a  standardized protocol tailored for this examination and automatic exposure  control for dose modulation.     FINDINGS: There is 5 mm anterolisthesis of L4 on L5. Bones are osteopenic. No  fracture. There are endplate degenerative changes.    There are vascular calcifications    L1-L2: No stenosis    L2-L3: There are facet degenerative changes with slight disc height loss and a  small bulge. There is mild canal and subarticular zone narrowing    L3-4: There are bilateral facet degenerative changes with thickening of the  ligamentum flavum and a small bulge. This mildly narrows the canal and  subarticular zones    L4-5: There is disc height loss with vacuum phenomenon. There is a pseudobulge  due to the anterolisthesis with bilateral facet arthropathy and thickening of  the ligamentum flavum. Canal stenosis is moderate to severe with narrowing of  the subarticular zones. Mild to moderate right and mild left foraminal narrowing    L5-S1: There is near-complete disc height loss with vacuum phenomenon. There is  a small bulge. This results in mild narrowing of the canal      Impression IMPRESSION:  1. Multilevel degenerative change detailed by level above most significant at  L4-L5           MRI Results (maximum last 3):  Results from [place] encounter on 09/26/15    MRI CERV SPINE WO CONT    Narrative EXAM:  MRI CERV SPINE WO CONT    INDICATION:   Cervical spinal stenosis    COMPARISON: None.    TECHNIQUE: MR imaging of the cervical spine was performed including sagittal T1,  T2, FIR;  axial 3D BASG, T1.  Contrast was not administered.    FINDINGS:  There is minimal reversal of curvature centered at C5. There is no significant  listhesis.. Vertebral body heights are maintained. Marrow signal is normal.  The  craniocervical junction is intact.  The course, caliber, and signal intensity of  the spinal cord are normal.      C2/3:  There is no spinal stenosis. The neural foramen are not well evaluated  secondary to the axial images starting below the level of the disc space. These  however appear relatively normal on sagittal imaging.    C3/4:  Mild disc space narrowing. Mild broad-based disc  osteophyte complex with  thickening of ligamentum flavum creating no significant spinal stenosis. There  is mild right and moderate left neural foraminal narrowing. There is mild  bilateral neural foraminal narrowing.    C4/5:  Mild disc space narrowing. Mild mild broad-based disc osteophyte complex  worsened centrally contacting the anterior aspect of the cord and creating  minimal spinal stenosis. There is mild left and moderate right neural foraminal  narrowing secondary to uncal spurring.    C5/6:  Moderate disc space narrowing. Mild broad-based disc osteophyte complex  contacting the anterior aspect the cord and causing mild spinal stenosis. There  is no significant neural foraminal narrowing.    C6/7:  Moderate disc space narrowing. Mild central and left paracentral disc  osteophyte complex with thickening of ligamentum flavum causing mild spinal  stenosis with relatively greater impingement left lateral recess. There is  moderate left neural foraminal narrowing.     C7/T1:  Moderate disc space narrowing. Minimal broad-based disc osteophyte   complex without significant spinal stenosis. There is mild bilateral neural  foraminal narrowing.      Impression IMPRESSION:    Multilevel spondylosis as above. Mild spinal stenosis at C5-6 and C6-7. Minimal  spinal stenosis at C4-5. Bilateral multilevel neural foraminal narrowing as  above.         PET Results (maximum last 3):  No results found for this or any previous visit.      Assessment and Plan   Diagnoses and all orders for this visit:    1. Slurred speech  -     CTA HEAD NECK W CONT; Future  -     ACETYLCHOLINE RECEPTOR PANEL  -     ASPIRIN TEST; Future  -     TSH AND FREE T4    2. 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  I do not have any records to review.  It sounds like Jamie Bryant never returned to her baseline fully.  Her speech is slurred here in the office and Jamie Bryant is very slow moving.  Jamie Bryant appears to have some type of parkinsonism which would be unusual to begin suddenly.  Stroke is possible.  I am going to check a CTA of the head and neck since Jamie Bryant will not tolerate an MRI.  Check an aspirin level.  Other possibility is some type of neuromuscular disease given the shortness of breath with exertion so I am going to check an acetylcholine panel.  Its not quite clear what is going on at this point.  I am going to have her follow-up in about 6 weeks with myself or the NP if I have no availability.  Stay on the aspirin.  If imaging is unrevealing and the acetylcholine testing is negative and Jamie Bryant seems more parkinsonian next visit we should consider starting Sinemet.  Questions answered.      I reviewed and decided to continue the current medications.  This clinical note was dictated with an electronic dictation software that  can make unintentional  errors.  If there are any questions, please contact me directly for clarification.      A notice of this visit/encounter being completed has been sent electronically to the patient's PCP and/or referring provider.     [place], [place]  Diplomate ABPN

## 2019-11-04 NOTE — Progress Notes (Signed)
Blood work looks good.  Normal thyroid.  No evidence of neuromuscular disease.

## 2019-11-04 NOTE — Patient Instructions (Signed)
PRESCRIPTION REFILL POLICY    [facility]   Statement to Patients  March 29, 2013      In an effort to ensure the large volume of patient prescription refills is processed in the most efficient and expeditious manner, we are asking our patients to assist us by calling your Pharmacy for all prescription refills, this will include also your  Mail Order Pharmacy. The pharmacy will contact our office electronically to continue the refill process.    Please do not wait until the last minute to call your pharmacy. We need at least 48 hours (2days) to fill prescriptions. We also encourage you to call your pharmacy before going to pick up your prescription to make sure it is ready.     With regard to controlled substance prescription refill requests (narcotic refills) that need to be picked up at our office, we ask your cooperation by providing us with at least 72 hours (3days) notice that you will need a refill.    We will not refill narcotic prescription refill requests after 4:00pm on any weekday, Monday through Thursday, or after 2:00pm on Fridays, or on the weekends.      We encourage everyone to explore another way of getting your prescription refill request processed using MyChart, our patient web portal through our electronic medical record system. MyChart is an efficient and effective way to communicate your medication request directly to the office and  downloadable as an app on your smart phone . MyChart also features a review functionality that allows you to view your medication list as well as leave messages for your physician. Are you ready to get connected? If so please review the attatched instructions or speak to any of our staff to get you set up right away!    Thank you so much for your cooperation. Should you have any questions please contact our Practice Administrator.    The Physicians and Staff,  [facility]

## 2019-11-04 NOTE — Progress Notes (Signed)
Jamie Bryant presents as a new patient for evaluation of changes in speech and memory loss. Depression screening done on patient.

## 2019-11-14 ENCOUNTER — Encounter: Attending: Family Medicine | Primary: Adolescent Medicine

## 2019-11-14 ENCOUNTER — Encounter: Payer: BLUE CROSS/BLUE SHIELD | Attending: Family Medicine | Primary: Adolescent Medicine

## 2019-11-15 ENCOUNTER — Encounter: Payer: BLUE CROSS/BLUE SHIELD | Attending: Family Medicine | Primary: Adolescent Medicine

## 2019-11-16 ENCOUNTER — Inpatient Hospital Stay: Admit: 2019-11-16 | Payer: MEDICARE | Attending: Family Medicine | Primary: Adolescent Medicine

## 2019-11-16 ENCOUNTER — Encounter

## 2019-11-16 DIAGNOSIS — R4781 Slurred speech: Secondary | ICD-10-CM

## 2019-11-16 LAB — CREATININE, POC
Creatinine (POC): 0.8 mg/dL (ref 0.6–1.3)
GFRAA, POC: >60 mL/min/{1.73_m2} (ref >60)
GFRNA, POC: >60 mL/min/{1.73_m2} (ref >60)

## 2019-11-16 LAB — AMB POC CREATININE
GFR African American: >60 mL/min/{1.73_m2} (ref >60)
GFR Non-African American: >60 mL/min/{1.73_m2} (ref >60)
POC Creatinine: 0.8 mg/dL (ref 0.6–1.3)

## 2019-11-16 MED ORDER — IOPAMIDOL 76 % IV SOLN
37076 mg iodine /mL (76 %) | Freq: Once | INTRAVENOUS | Status: AC
Start: 2019-11-16 — End: 2019-11-16
  Administered 2019-11-16: 20:00:00 via INTRAVENOUS

## 2019-11-16 MED ORDER — SODIUM CHLORIDE 0.9% BOLUS IV
0.9 % | Freq: Once | INTRAVENOUS | Status: DC
Start: 2019-11-16 — End: 2019-11-17

## 2019-11-16 MED ORDER — SODIUM CHLORIDE 0.9 % IJ SYRG
Freq: Once | INTRAMUSCULAR | Status: DC
Start: 2019-11-16 — End: 2019-11-17

## 2019-11-16 MED FILL — NORMAL SALINE FLUSH 0.9 % INJECTION SYRINGE: INTRAMUSCULAR | Qty: 10

## 2019-11-16 MED FILL — ISOVUE-370  76 % INTRAVENOUS SOLUTION: 370 mg iodine /mL (76 %) | INTRAVENOUS | Qty: 100

## 2019-11-16 NOTE — Progress Notes (Signed)
Patient name an DOB verified she states she is having trouble with her speech. Results of CTA head given.

## 2019-11-16 NOTE — Progress Notes (Signed)
Patient called, verified and notified. Patient agreed to ENT referral.

## 2019-11-16 NOTE — Progress Notes (Signed)
I think at this juncture she might need to see a ENT specialist in the meantime.  I do not have a good explanation for the speech changes.  There is no aneurysm or abnormal blood vessel in the brain.  Blood work does not indicate to me any type of specific disease that would do this.  I can send her to speech therapy in the interim if she would like to see if that helps.

## 2019-11-17 ENCOUNTER — Ambulatory Visit: Payer: MEDICARE | Primary: Adolescent Medicine

## 2019-11-17 LAB — ACETYLCHOLINE RECEPTOR PANEL
AChR Binding Ab, serum: <0.03 nmol/L (ref 0.00–0.24)
AChR Blocking Ab: 16 % (ref 0–25)
AChR Modulating Ab: <12 % (ref 0–20)

## 2019-11-17 LAB — TSH AND FREE T4
T4, Free: 0.99 ng/dL (ref 0.82–1.77)
TSH: 1.76 u[IU]/mL (ref 0.450–4.500)

## 2019-11-17 LAB — TSH + FREE T4 PANEL
T4 Free: 0.99 ng/dL (ref 0.82–1.77)
TSH: 1.76 u[IU]/mL (ref 0.450–4.500)

## 2019-11-17 MED ORDER — ALBUTEROL SULFATE HFA 90 MCG/ACTUATION AEROSOL INHALER
90 mcg/actuation | RESPIRATORY_TRACT | Status: AC
Start: 2019-11-17 — End: ?

## 2019-11-17 MED ORDER — LIDOCAINE 2 % MUCOSAL GEL
2 % | Status: AC
Start: 2019-11-17 — End: ?

## 2019-11-17 MED ORDER — BUTAMBEN-TETRACAINE-BENZOCAINE 2 %-2 %-14 % TOPICAL SPRAY
2 %- %-14 % (00 mg/sec) | CUTANEOUS | Status: AC
Start: 2019-11-17 — End: ?

## 2019-11-17 MED ORDER — LIDOCAINE (PF) 10 MG/ML (1 %) IJ SOLN
10 mg/mL (1 %) | INTRAMUSCULAR | Status: AC
Start: 2019-11-17 — End: ?

## 2019-11-17 MED FILL — LIDOCAINE (PF) 10 MG/ML (1 %) IJ SOLN: 10 mg/mL (1 %) | INTRAMUSCULAR | Qty: 10

## 2019-11-17 MED FILL — VENTOLIN HFA 90 MCG/ACTUATION AEROSOL INHALER: 90 mcg/actuation | RESPIRATORY_TRACT | Qty: 8

## 2019-11-17 MED FILL — CETACAINE 2 %-2 %-14 % (200 MG/SEC) TOPICAL SPRAY: 2 %- %-14 % (00 mg/sec) | CUTANEOUS | Qty: 20

## 2019-11-17 MED FILL — LIDOCAINE 2 % MUCOSAL GEL: 2 % | Qty: 5

## 2019-11-17 NOTE — Progress Notes (Signed)
Patient name an DOB verified she states she is having trouble with her speech. Results of CTA head given.

## 2019-11-18 NOTE — Progress Notes (Signed)
I think at this juncture she might need to see a ENT specialist in the meantime.  I do not have a good explanation for the speech changes.  There is no aneurysm or abnormal blood vessel in the brain.  Blood work does not indicate to me any type of specific disease that would do this.  I can send her to speech therapy in the interim if she would like to see if that helps.

## 2019-11-18 NOTE — Progress Notes (Signed)
Patient called, verified and notified. Patient agreed to ENT referral.

## 2020-01-02 NOTE — Telephone Encounter (Signed)
Patient calling and doesn't want to see the NP, she doesn't feel they would be able to order the tests she wants done.  She states her insurance won't cover her seeing the NP, she was informed her insurance would cover her appt but she said she has to meet her deductible first and she doesn't want to waste money on the NP when she could spend the money for her Speech Therapy or seeing the doctor.  She still feels she has had a stroke. And would like to get more tests run.  She needs to get a copy of the recommendations made at her last appointment mailed to her. She would like to get her CT scan results mailed to her as well.

## 2020-01-03 NOTE — Telephone Encounter (Signed)
Unfortunately I do not have a good clear answer for why she has her symptoms.  We ruled out the serious life-threatening issues like aneurysm and stroke.  Okay to send the CTA results to her as well as blood work.  I think the next appropriate evaluation would be from neuromuscular neurology which would be at [facility] or [facility].  I can place a referral.

## 2020-01-04 NOTE — Telephone Encounter (Signed)
We have already done these tests for stroke.  I am not sure what she is looking for.  If we have Dr. Ilene Qua number I can contact him directly.

## 2020-01-04 NOTE — Telephone Encounter (Signed)
Dr Rande Lawman calling to talk to you states pt called him and said test need to be run in regards to a stroke, asked pt for more info and pt said she thinks she was told by neurology that she didn't have a stroke. Dr Rande Lawman would like some clarification. Please call at your convenience.

## 2020-01-04 NOTE — Telephone Encounter (Signed)
Called and LVM for the patient to call back to discuss Dr. Normand Sloop recommendations.

## 2020-01-05 NOTE — Telephone Encounter (Signed)
Patient's call returned, advised that Dr. Orvis Brill will call Dr. Rande Lawman to discuss when she is back in the office.

## 2020-01-05 NOTE — Telephone Encounter (Signed)
Hello Message    "I'm returning your call"

## 2020-01-05 NOTE — Telephone Encounter (Signed)
Pt calling back to speak to nurse.

## 2020-01-06 ENCOUNTER — Ambulatory Visit: Payer: MEDICARE | Attending: Critical Care Medicine | Primary: Adolescent Medicine

## 2020-01-26 ENCOUNTER — Encounter: Attending: Adolescent Medicine | Primary: Adolescent Medicine

## 2020-01-31 ENCOUNTER — Encounter: Payer: MEDICARE | Attending: Adolescent Medicine | Primary: Adolescent Medicine

## 2020-02-02 ENCOUNTER — Telehealth: Attending: Adolescent Medicine | Primary: Adolescent Medicine

## 2020-02-02 ENCOUNTER — Telehealth: Admit: 2020-02-02 | Payer: MEDICARE | Attending: Adolescent Medicine | Primary: Adolescent Medicine

## 2020-02-02 DIAGNOSIS — I10 Essential (primary) hypertension: Secondary | ICD-10-CM

## 2020-02-02 MED ORDER — QUINAPRIL 40 MG TAB
40 mg | ORAL_TABLET | Freq: Every evening | ORAL | 0 refills | Status: DC
Start: 2020-02-02 — End: 2020-02-29

## 2020-02-02 MED ORDER — SITAGLIPTIN 100 MG TAB
100 mg | ORAL_TABLET | Freq: Every day | ORAL | 0 refills | Status: AC
Start: 2020-02-02 — End: ?

## 2020-02-02 NOTE — Progress Notes (Signed)
Jamie Bryant is a 79 y.o. female, evaluated via audio-only technology on 02/02/2020 for No chief complaint on file.  .    Assessment & Plan:   Diagnoses and all orders for this visit:    1. Essential hypertension, benign  -     quinapriL (ACCUPRIL) 40 mg tablet; Take 1 Tab by mouth nightly. Indication: high blood pressure   low-sodium diet, age-appropriate exercise    2. Controlled type 2 diabetes mellitus with complication, with long-term current use of insulin (HCC)-controlled by history  -     SITagliptin (Januvia) 100 mg tablet; Take 1 Tab by mouth daily. Indication: diabetes  Low-carb diet, exercise advised    3. Counseling for estrogen replacement therapy  Patient will continue on HRT    4. Encounter for screening mammogram for malignant neoplasm of breast  -     MAM MAMMO BI SCREENING INCL CAD; Future    5. Hyperlipidemia, unspecified hyperlipidemia type  Patient will continue low-cholesterol diet; advised to have a lipid panel  6. Gastroparesis due to DM [facility])  Patient will continue on Linzess    RTO 1 mo    1I have discussed the diagnosis with the patient and the intended plan as seen in the  orders above.  The patient understands and agees with the plan.  All questions the patient posed were answered.  Patient labs and/or xrays were reviewed  Past records were reviewed.Advised patient to call back or return to office if symptoms worsen/change/persist.  Discussed expected course/resolution/complications of diagnosis in detail with patient.     Medication risks/benefits/costs/interactions/alternatives discussed with patient  2  Subjective:     79 year old new patient establishing care and following up of hypertension and diabetes.    Patient reports that she needs a PCP and medication refill.  Her previous PCP released her from his practice.    Patient has insulin requiring type 2 diabetes complicated by diabetic neuropathy and gastroparesis.  Out Januvia x1 week.  Last A1c was 6.7%.  FBS 125.  Denies  hypoglycemia.    Patient has essential hypertension.  Out of quinapril for 1 week.  Denies chest pain leg swelling shortness of breath palpitations orthopnea or PND.    Uses estradiol for hot flashes.  Mammogram is not up-to-date.    Medications discontinued from med list are Setia clonidine and amlodipine.    Status post COVID-19 vaccine #1.  Prior to Admission medications    Medication Sig Start Date End Date Taking? Authorizing Provider   SITagliptin (Januvia) 100 mg tablet Take 1 Tab by mouth daily. Indication: diabetes 02/02/20  Yes Lesli Albee, MD   quinapriL (ACCUPRIL) 40 mg tablet Take 1 Tab by mouth nightly. Indication: high blood pressure 02/02/20  Yes Lesli Albee, MD   pregabalin (LYRICA) 150 mg capsule Take 150 mg by mouth three (3) times daily.    Provider, Historical   omeprazole (PRILOSEC) 40 mg capsule Take 40 mg by mouth daily.    Provider, Historical   HYDROcodone-acetaminophen (NORCO) 10-325 mg tablet Take 1 Tab by mouth three (3) times daily.    Provider, Historical   torsemide (DEMADEX) 20 mg tablet Take  by mouth daily.    Provider, Historical   linaclotide (LINZESS) 290 mcg cap capsule Take  by mouth Daily (before breakfast).    Provider, Historical   AMLODIPINE BESYLATE, BULK, Take 5 mg by mouth daily.    Provider, Historical   estradiol (ESTRACE) 1 mg tablet Take 1 mg by mouth daily.  Provider, Historical   cholecalciferol, VITAMIN D3, (VITAMIN D3) 5,000 unit tab tablet Take  by mouth daily.    Provider, Historical   co-enzyme Q-10 (COQ-10) 100 mg capsule Take 100 mg by mouth daily.    Provider, Historical   aspirin 81 mg chewable tablet Take 81 mg by mouth daily.    Provider, Historical   MV,CAL,MIN/IRON/FOLIC ACID/LUT (COMPLETE MULTI PO) Take  by mouth.    Provider, Historical   beclomethasone (QVAR) 40 mcg/actuation aero Take 1 Puff by inhalation two (2) times a day.    Provider, Historical   TRIAMCINOLONE ACETONIDE (NASACORT NA) by Nasal route.    Provider, Historical   olopatadine  (PATANOL) 0.1 % ophthalmic solution Administer 2 Drops to both eyes two (2) times a day.    Provider, Historical   DOCUSATE CALCIUM (STOOL SOFTENER PO) Take  by mouth three (3) times daily as needed.    Provider, Historical     There is no problem list on file for this patient.    Current Outpatient Medications   Medication Sig Dispense Refill   ??? SITagliptin (Januvia) 100 mg tablet Take 1 Tab by mouth daily. Indication: diabetes 30 Tab 0   ??? quinapriL (ACCUPRIL) 40 mg tablet Take 1 Tab by mouth nightly. Indication: high blood pressure 30 Tab 0   ??? pregabalin (LYRICA) 150 mg capsule Take 150 mg by mouth three (3) times daily.     ??? omeprazole (PRILOSEC) 40 mg capsule Take 40 mg by mouth daily.     ??? HYDROcodone-acetaminophen (NORCO) 10-325 mg tablet Take 1 Tab by mouth three (3) times daily.     ??? torsemide (DEMADEX) 20 mg tablet Take  by mouth daily.     ??? linaclotide (LINZESS) 290 mcg cap capsule Take  by mouth Daily (before breakfast).     ??? AMLODIPINE BESYLATE, BULK, Take 5 mg by mouth daily.     ??? estradiol (ESTRACE) 1 mg tablet Take 1 mg by mouth daily.     ??? cholecalciferol, VITAMIN D3, (VITAMIN D3) 5,000 unit tab tablet Take  by mouth daily.     ??? co-enzyme Q-10 (COQ-10) 100 mg capsule Take 100 mg by mouth daily.     ??? aspirin 81 mg chewable tablet Take 81 mg by mouth daily.     ??? MV,CAL,MIN/IRON/FOLIC ACID/LUT (COMPLETE MULTI PO) Take  by mouth.     ??? beclomethasone (QVAR) 40 mcg/actuation aero Take 1 Puff by inhalation two (2) times a day.     ??? TRIAMCINOLONE ACETONIDE (NASACORT NA) by Nasal route.     ??? olopatadine (PATANOL) 0.1 % ophthalmic solution Administer 2 Drops to both eyes two (2) times a day.     ??? DOCUSATE CALCIUM (STOOL SOFTENER PO) Take  by mouth three (3) times daily as needed.       Allergies   Allergen Reactions   ??? Ampicillin Rash   ??? Statins-Hmg-Coa Reductase Inhibitors Myalgia     Lovastatin, pravastatin, simvastatin, atorvastatin, zetia, livalo, lovaza     Past Medical History:    Diagnosis Date   ??? Asthma    ??? Diabetes (HCC)    ??? Fibromyalgia    ??? Hyperlipemia    ??? Hypertension      Past Surgical History:   Procedure Laterality Date   ??? HX HYSTERECTOMY       No family history on file.  Social History     Tobacco Use   ??? Smoking status: Never Smoker   ??? Smokeless  tobacco: Never Used   Substance Use Topics   ??? Alcohol use: No       ROS  Pertinent positives and negatives are listed in HPI    No flowsheet data found.     Jamie Bryant, who was evaluated through a patient-initiated, synchronous (real-time) audio only encounter, and/or her healthcare decision maker, is aware that it is a billable service, with coverage as determined by her insurance carrier. She provided verbal consent to proceed: Yes. She has not had a related appointment within my department in the past 7 days or scheduled within the next 24 hours.      Total Time: minutes: 21-30 minutes    Mallie Snooks, MD

## 2020-02-02 NOTE — Progress Notes (Signed)
Jamie Bryant is a 79 y.o. female, evaluated via audio-only technology on 02/02/2020 for No chief complaint on file.  .    Assessment & Plan:   Diagnoses and all orders for this visit:    1. Essential hypertension, benign  -     quinapriL (ACCUPRIL) 40 mg tablet; Take 1 Tab by mouth nightly. Indication: high blood pressure   low-sodium diet, age-appropriate exercise    2. Controlled type 2 diabetes mellitus with complication, with long-term current use of insulin (HCC)-controlled by history  -     SITagliptin (Januvia) 100 mg tablet; Take 1 Tab by mouth daily. Indication: diabetes  Low-carb diet, exercise advised    3. Counseling for estrogen replacement therapy  Patient will continue on HRT    4. Encounter for screening mammogram for malignant neoplasm of breast  -     MAM MAMMO BI SCREENING INCL CAD; Future    5. Hyperlipidemia, unspecified hyperlipidemia type  Patient will continue low-cholesterol diet; advised to have a lipid panel  6. Gastroparesis due to DM [facility])  Patient will continue on Linzess    RTO 1 mo    1I have discussed the diagnosis with the patient and the intended plan as seen in the  orders above.  The patient understands and agees with the plan.  All questions the patient posed were answered.  Patient labs and/or xrays were reviewed  Past records were reviewed.Advised patient to call back or return to office if symptoms worsen/change/persist.  Discussed expected course/resolution/complications of diagnosis in detail with patient.     Medication risks/benefits/costs/interactions/alternatives discussed with patient  2  Subjective:     79 year old new patient establishing care and following up of hypertension and diabetes.    Patient reports that she needs a PCP and medication refill.  Her previous PCP released her from his practice.     Patient has insulin requiring type 2 diabetes complicated by diabetic neuropathy and gastroparesis.  Out Januvia x1 week.  Last A1c was 6.7%.  FBS 125.  Denies hypoglycemia.    Patient has essential hypertension.  Out of quinapril for 1 week.  Denies chest pain leg swelling shortness of breath palpitations orthopnea or PND.    Uses estradiol for hot flashes.  Mammogram is not up-to-date.    Medications discontinued from med list are Setia clonidine and amlodipine.    Status post COVID-19 vaccine #1.  Prior to Admission medications    Medication Sig Start Date End Date Taking? Authorizing Provider   SITagliptin (Januvia) 100 mg tablet Take 1 Tab by mouth daily. Indication: diabetes 02/02/20  Yes Lesli Albee, MD   quinapriL (ACCUPRIL) 40 mg tablet Take 1 Tab by mouth nightly. Indication: high blood pressure 02/02/20  Yes Lesli Albee, MD   pregabalin (LYRICA) 150 mg capsule Take 150 mg by mouth three (3) times daily.    Provider, Historical   omeprazole (PRILOSEC) 40 mg capsule Take 40 mg by mouth daily.    Provider, Historical   HYDROcodone-acetaminophen (NORCO) 10-325 mg tablet Take 1 Tab by mouth three (3) times daily.    Provider, Historical   torsemide (DEMADEX) 20 mg tablet Take  by mouth daily.    Provider, Historical   linaclotide (LINZESS) 290 mcg cap capsule Take  by mouth Daily (before breakfast).    Provider, Historical   AMLODIPINE BESYLATE, BULK, Take 5 mg by mouth daily.    Provider, Historical   estradiol (ESTRACE) 1 mg tablet Take 1 mg by mouth daily.  Provider, Historical   cholecalciferol, VITAMIN D3, (VITAMIN D3) 5,000 unit tab tablet Take  by mouth daily.    Provider, Historical   co-enzyme Q-10 (COQ-10) 100 mg capsule Take 100 mg by mouth daily.    Provider, Historical   aspirin 81 mg chewable tablet Take 81 mg by mouth daily.    Provider, Historical   MV,CAL,MIN/IRON/FOLIC ACID/LUT (COMPLETE MULTI PO) Take  by mouth.    Provider, Historical    beclomethasone (QVAR) 40 mcg/actuation aero Take 1 Puff by inhalation two (2) times a day.    Provider, Historical   TRIAMCINOLONE ACETONIDE (NASACORT NA) by Nasal route.    Provider, Historical   olopatadine (PATANOL) 0.1 % ophthalmic solution Administer 2 Drops to both eyes two (2) times a day.    Provider, Historical   DOCUSATE CALCIUM (STOOL SOFTENER PO) Take  by mouth three (3) times daily as needed.    Provider, Historical     There is no problem list on file for this patient.    Current Outpatient Medications   Medication Sig Dispense Refill   ??? SITagliptin (Januvia) 100 mg tablet Take 1 Tab by mouth daily. Indication: diabetes 30 Tab 0   ??? quinapriL (ACCUPRIL) 40 mg tablet Take 1 Tab by mouth nightly. Indication: high blood pressure 30 Tab 0   ??? pregabalin (LYRICA) 150 mg capsule Take 150 mg by mouth three (3) times daily.     ??? omeprazole (PRILOSEC) 40 mg capsule Take 40 mg by mouth daily.     ??? HYDROcodone-acetaminophen (NORCO) 10-325 mg tablet Take 1 Tab by mouth three (3) times daily.     ??? torsemide (DEMADEX) 20 mg tablet Take  by mouth daily.     ??? linaclotide (LINZESS) 290 mcg cap capsule Take  by mouth Daily (before breakfast).     ??? AMLODIPINE BESYLATE, BULK, Take 5 mg by mouth daily.     ??? estradiol (ESTRACE) 1 mg tablet Take 1 mg by mouth daily.     ??? cholecalciferol, VITAMIN D3, (VITAMIN D3) 5,000 unit tab tablet Take  by mouth daily.     ??? co-enzyme Q-10 (COQ-10) 100 mg capsule Take 100 mg by mouth daily.     ??? aspirin 81 mg chewable tablet Take 81 mg by mouth daily.     ??? MV,CAL,MIN/IRON/FOLIC ACID/LUT (COMPLETE MULTI PO) Take  by mouth.     ??? beclomethasone (QVAR) 40 mcg/actuation aero Take 1 Puff by inhalation two (2) times a day.     ??? TRIAMCINOLONE ACETONIDE (NASACORT NA) by Nasal route.     ??? olopatadine (PATANOL) 0.1 % ophthalmic solution Administer 2 Drops to both eyes two (2) times a day.     ??? DOCUSATE CALCIUM (STOOL SOFTENER PO) Take  by mouth three (3) times daily as needed.        Allergies   Allergen Reactions   ??? Ampicillin Rash   ??? Statins-Hmg-Coa Reductase Inhibitors Myalgia     Lovastatin, pravastatin, simvastatin, atorvastatin, zetia, livalo, lovaza     Past Medical History:   Diagnosis Date   ??? Asthma    ??? Diabetes (HCC)    ??? Fibromyalgia    ??? Hyperlipemia    ??? Hypertension      Past Surgical History:   Procedure Laterality Date   ??? HX HYSTERECTOMY       No family history on file.  Social History     Tobacco Use   ??? Smoking status: Never Smoker   ??? Smokeless  tobacco: Never Used   Substance Use Topics   ??? Alcohol use: No       ROS  Pertinent positives and negatives are listed in HPI    No flowsheet data found.     Jamie Bryant, who was evaluated through a patient-initiated, synchronous (real-time) audio only encounter, and/or her healthcare decision maker, is aware that it is a billable service, with coverage as determined by her insurance carrier. She provided verbal consent to proceed: Yes. She has not had a related appointment within my department in the past 7 days or scheduled within the next 24 hours.      Total Time: minutes: 21-30 minutes    Mallie Snooks, MD

## 2020-02-29 ENCOUNTER — Ambulatory Visit: Attending: Adolescent Medicine | Primary: Adolescent Medicine

## 2020-02-29 ENCOUNTER — Ambulatory Visit
Admit: 2020-02-29 | Discharge: 2020-02-29 | Payer: MEDICARE | Attending: Adolescent Medicine | Primary: Adolescent Medicine

## 2020-02-29 DIAGNOSIS — G20 Parkinson's disease: Secondary | ICD-10-CM

## 2020-02-29 MED ORDER — QUINAPRIL 40 MG TAB
40 mg | ORAL_TABLET | Freq: Every evening | ORAL | 1 refills | Status: DC
Start: 2020-02-29 — End: 2020-12-04

## 2020-02-29 NOTE — Progress Notes (Signed)
Chief Complaint   Patient presents with   . Annual Wellness Visit     Patient is here for a medicare wellness visit     1. Have you been to the ER, urgent care clinic since your last visit?  Hospitalized since your last visit?No    2. Have you seen or consulted any other health care providers outside of the [facility] since your last visit?  Include any pap smears or colon screening. No

## 2020-02-29 NOTE — Progress Notes (Addendum)
SPORTS MEDICINE AND PRIMARY CARE  [name]. Nabor Thomann,MD  [address] [name] [address]    Chief Complaint   Patient presents with   ??? Annual Wellness Visit     Patient  is here for a medicare wellness visit.        SUBJECTIVE:    Jamie Bryant is a 79 y.o. female following up on her speech changes and left sided weakness.  .    Clinical history of  diabetes, followed by endocrinology, hyperlipidemia, statin myalgia/intolerance, hypertension, asthma, fibromyalgia, cervical spondylosis and knee osteoarthritis, followed by pain mgt. Preop knee arthroplasty.    Patient is concerned about possible stroke in 2020.  She is left-hand dominant. Lt hand is weak chronically. Writing is bad. She presented to neurology clinic with slow, slurred speech and subjective left-sided weakness last year.  Weakness began after an ED visit in 2019 for acute flu-like illness. .  Work-up showed old left cerebellar infarct, mild cerebral atrophy.  And mild chronic microvascular ischemic change on CTA of the head and neck.  Acetylcholine studies were negative.  Aspirin test was unremarkable.  Neurologist wanted to follow patient up for Parkinson's. She had masked facies, decreased blink rate and bradykinesia in the neurology office.  There was discrepancy about patient following up with the nurse practitioner in the neurology clinic. Patient apparently did not return to clinic.     Concerned about chronic tongue numbness, short term memory loss. Asks about prevagen.    Patient lives independently at [facility].  She expresses ongoing concern about  chest pain, dyspnea, headache and nausea, no diaphoresis or pain radiation, that lasts 30-60 min when her neighbor who lives over her sprays some sort of chemical in his apt. Chest pain improves as the smell fades.     Patient had a negative cardiac work-up in 2017 when she had a chest pressure. Pt had a normal EKG and  Lexiscan stress test.  Echocardiogram showed grade 1 diastolic dysfunction.    Pt feels her doctors are "not doing anything."    Needs mammogram and colonoscopy.      Current Outpatient Medications   Medication Sig Dispense Refill   ??? quinapriL (ACCUPRIL) 40 mg tablet Take 1 Tab by mouth nightly. Indication: high blood pressure 90 Tab 1   ??? SITagliptin (Januvia) 100 mg tablet Take 1 Tab by mouth daily. Indication: diabetes 30 Tab 0   ??? pregabalin (LYRICA) 150 mg capsule Take 150 mg by mouth three (3) times daily.     ??? omeprazole (PRILOSEC) 40 mg capsule Take 40 mg by mouth daily.     ??? HYDROcodone-acetaminophen (NORCO) 10-325 mg tablet Take 1 Tab by mouth three (3) times daily.     ??? torsemide (DEMADEX) 20 mg tablet Take  by mouth daily.     ??? linaclotide (LINZESS) 290 mcg cap capsule Take  by mouth Daily (before breakfast).     ??? AMLODIPINE BESYLATE, BULK, Take 5 mg by mouth daily.     ??? estradiol (ESTRACE) 1 mg tablet Take 1 mg by mouth daily.     ??? cholecalciferol, VITAMIN D3, (VITAMIN D3) 5,000 unit tab tablet Take  by mouth daily.     ??? co-enzyme Q-10 (COQ-10) 100 mg capsule Take 100 mg by mouth daily.     ??? aspirin 81 mg chewable tablet Take 81 mg by mouth daily.     ??? MV,CAL,MIN/IRON/FOLIC ACID/LUT (COMPLETE MULTI PO) Take  by mouth.     ???  beclomethasone (QVAR) 40 mcg/actuation aero Take 1 Puff by inhalation two (2) times a day.     ??? TRIAMCINOLONE ACETONIDE (NASACORT NA) by Nasal route.     ??? olopatadine (PATANOL) 0.1 % ophthalmic solution Administer 2 Drops to both eyes two (2) times a day.     ??? DOCUSATE CALCIUM (STOOL SOFTENER PO) Take  by mouth three (3) times daily as needed.       Past Medical History:   Diagnosis Date   ??? Asthma    ??? Diabetes ([place])    ??? Fibromyalgia    ??? Hyperlipemia    ??? Hypertension      Past Surgical History:   Procedure Laterality Date   ??? HX HYSTERECTOMY       Allergies    Allergen Reactions   ??? Ampicillin Rash   ??? Statins-Hmg-Coa Reductase Inhibitors Myalgia     Lovastatin, pravastatin, simvastatin, atorvastatin, zetia, livalo, lovaza       REVIEW OF SYSTEMS:  Per hpi      Social History     Socioeconomic History   ??? Marital status: DIVORCED     Spouse name: Not on file   ??? Number of children: Not on file   ??? Years of education: Not on file   ??? Highest education level: Not on file   Tobacco Use   ??? Smoking status: Never Smoker   ??? Smokeless tobacco: Never Used   Substance and Sexual Activity   ??? Alcohol use: No   ??? Drug use: No     History reviewed. No pertinent family history.    OBJECTIVE:     Visit Vitals  BP (!) 146/70   Pulse 76   Temp 98 ??F (36.7 ??C)   Resp 16   Ht 5\' 4"  (1.626 m)   Wt 210 lb 11.2 oz (95.6 kg)   SpO2 95%   BMI 36.17 kg/m??   Appearance: alert, decreased facial expression. Ambulates with walker, in no acute distress.  General exam: CVS exam BP noted to be slightly elevated today in office, S1, S2 normal, no gallop, no murmur, chest clear, no JVD, no HSM, no edema, peripheral vascular exam both carotids normal upstroke without bruits, radial pulses normal, pedal pulses normal both DP's and PT's, neurological exam alert,   Speech seems a bit slow but is not slurred, no focal findings or movement disorder noted.    No visits with results within 3 Month(s) from this visit.   Latest known visit with results is:   Hospital Outpatient Visit on 11/16/2019   Component Date Value Ref Range Status   ??? Creatinine (POC) 11/16/2019 0.8  0.6 - 1.3 mg/dL Final   ??? GFRAA, POC 11/16/2019 >60  >60 ml/min/1.61m2 Final   ??? GFRNA, POC 11/16/2019 >60  >60 ml/min/1.30m2 Final    Estimated GFR is calculated using the IDMS-traceable Modification of Diet in Renal Disease (MDRD) Study equation, reported for both African Americans (GFRAA) and non-African Americans (GFRNA), and normalized to 1.22m2 body surface area. The physician must decide which value applies to the patient.        ASSESSMENT:   1. Parkinsonism, unspecified Parkinsonism type ([place])    2. Essential hypertension, benign -adequate control in office   3. Hyperlipidemia, unspecified hyperlipidemia type -LDL of 170 in 2017, no other labs available   4. Type 2 diabetes mellitus with diabetic polyneuropathy, without long-term current use of insulin (HCC) -recent A1c not documented   5. Mild intermittent asthma  -complicated by  fume exposure at home   6. Osteoarthritis of both knees, unspecified osteoarthritis type -preop   7. Lumbar spondylolysis    8. Osteoarthritis of cervical spine, unspecified spinal osteoarthritis complication status    9. 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 Diastolic dysfunction      PLAN:  .  Orders Placed This Encounter   ??? quinapriL (ACCUPRIL) 40 mg tablet refill   79 year old female with multiple risk factors for cardiovascular disease and an old left cerebellar infarct and mild cerebral atrophy on CT presents for general follow-up evaluation of left-sided weakness and short-term memory loss; weakness, according to the chart began after she was treated in the ED for flulike illness in 2019.  Patient followed up with neurology and had an unremarkable work-up.  The neurologist wanted to trial patient on Sinemet but patient did not return to the office.  Will refer patient back to neurology.    Patient did not agree to blood work today stating it was getting dark and she needs to drive home.    I have discussed the diagnosis with the patient and the intended plan as seen in the  orders above.  The patient understands and agrees with the plan.  The patient has   received an after visit summary and questions were answered concerning  future plans  Patient labs and/or xrays were reviewed  Past records were reviewed.    Counseled regarding diet, exercise and healthy lifestyle          Advised patient to proceed to urgent care, call back or return to office if symptoms worsen/change/persist.   Discussed expected course/resolution/complications of diagnosis in detail with patient.     Medication risks/benefits/costs/interactions/alternatives discussed with patient  RTO 3 mo    Signed,  Irving Copas M.D.    This note was created using voice recognition software.  Edits have been made but syntax errors might exist.

## 2020-02-29 NOTE — Progress Notes (Signed)
Chief Complaint   Patient presents with   ??? Annual Wellness Visit     Patient  is here for a medicare wellness visit.      1. Have you been to the ER, urgent care clinic since your last visit?  Hospitalized since your last visit?No    2. Have you seen or consulted any other health care providers outside of the [facility] since your last visit?  Include any pap smears or colon screening. No

## 2020-02-29 NOTE — Progress Notes (Signed)
SPORTS MEDICINE AND PRIMARY CARE  Jamie Bryant. Jamie Kathan,MD  [address] [name] [address]    Chief Complaint   Patient presents with   ??? Annual Wellness Visit     Patient  is here for a medicare wellness visit.        SUBJECTIVE:    Jamie Bryant is a 79 y.o. female following up on her speech changes and left sided weakness.  .    Clinical history of  diabetes, followed by endocrinology, hyperlipidemia, statin myalgia/intolerance, hypertension, asthma, fibromyalgia, cervical spondylosis and knee osteoarthritis, followed by pain mgt. Preop knee arthroplasty.    Patient is concerned about possible stroke in 2020.  She is left-hand dominant. Lt hand is weak chronically. Writing is bad. She presented to neurology clinic with slow, slurred speech and subjective left-sided weakness last year.  Weakness began after an ED visit in 2019 for acute flu-like illness. .  Work-up showed old left cerebellar infarct, mild cerebral atrophy.  And mild chronic microvascular ischemic change on CTA of the head and neck.  Acetylcholine studies were negative.  Aspirin test was unremarkable.  Neurologist wanted to follow patient up for Parkinson's. She had masked facies, decreased blink rate and bradykinesia in the neurology office.  There was discrepancy about patient following up with the nurse practitioner in the neurology clinic. Patient apparently did not return to clinic.     Concerned about chronic tongue numbness, short term memory loss. Asks about prevagen.    Patient lives independently at [facility].  She expresses ongoing concern about  chest pain, dyspnea, headache and nausea, no diaphoresis or pain radiation, that lasts 30-60 min when her neighbor who lives over her sprays some sort of chemical in his apt. Chest pain improves as the smell fades.    Patient had a negative cardiac work-up in 2017 when she had a chest pressure. Pt had a normal EKG and  Lexiscan stress test.  Echocardiogram showed grade 1 diastolic  dysfunction.    Pt feels her doctors are "not doing anything."    Needs mammogram and colonoscopy.      Current Outpatient Medications   Medication Sig Dispense Refill   ??? quinapriL (ACCUPRIL) 40 mg tablet Take 1 Tab by mouth nightly. Indication: high blood pressure 90 Tab 1   ??? SITagliptin (Januvia) 100 mg tablet Take 1 Tab by mouth daily. Indication: diabetes 30 Tab 0   ??? pregabalin (LYRICA) 150 mg capsule Take 150 mg by mouth three (3) times daily.     ??? omeprazole (PRILOSEC) 40 mg capsule Take 40 mg by mouth daily.     ??? HYDROcodone-acetaminophen (NORCO) 10-325 mg tablet Take 1 Tab by mouth three (3) times daily.     ??? torsemide (DEMADEX) 20 mg tablet Take  by mouth daily.     ??? linaclotide (LINZESS) 290 mcg cap capsule Take  by mouth Daily (before breakfast).     ??? AMLODIPINE BESYLATE, BULK, Take 5 mg by mouth daily.     ??? estradiol (ESTRACE) 1 mg tablet Take 1 mg by mouth daily.     ??? cholecalciferol, VITAMIN D3, (VITAMIN D3) 5,000 unit tab tablet Take  by mouth daily.     ??? co-enzyme Q-10 (COQ-10) 100 mg capsule Take 100 mg by mouth daily.     ??? aspirin 81 mg chewable tablet Take 81 mg by mouth daily.     ??? MV,CAL,MIN/IRON/FOLIC ACID/LUT (COMPLETE MULTI PO) Take  by mouth.     ???  beclomethasone (QVAR) 40 mcg/actuation aero Take 1 Puff by inhalation two (2) times a day.     ??? TRIAMCINOLONE ACETONIDE (NASACORT NA) by Nasal route.     ??? olopatadine (PATANOL) 0.1 % ophthalmic solution Administer 2 Drops to both eyes two (2) times a day.     ??? DOCUSATE CALCIUM (STOOL SOFTENER PO) Take  by mouth three (3) times daily as needed.       Past Medical History:   Diagnosis Date   ??? Asthma    ??? Diabetes (HCC)    ??? Fibromyalgia    ??? Hyperlipemia    ??? Hypertension      Past Surgical History:   Procedure Laterality Date   ??? HX HYSTERECTOMY       Allergies   Allergen Reactions   ??? Ampicillin Rash   ??? Statins-Hmg-Coa Reductase Inhibitors Myalgia     Lovastatin, pravastatin, simvastatin, atorvastatin, zetia, livalo, lovaza        REVIEW OF SYSTEMS:  Per hpi      Social History     Socioeconomic History   ??? Marital status: DIVORCED     Spouse name: Not on file   ??? Number of children: Not on file   ??? Years of education: Not on file   ??? Highest education level: Not on file   Tobacco Use   ??? Smoking status: Never Smoker   ??? Smokeless tobacco: Never Used   Substance and Sexual Activity   ??? Alcohol use: No   ??? Drug use: No     History reviewed. No pertinent family history.    OBJECTIVE:     Visit Vitals  BP (!) 146/70   Pulse 76   Temp 98 ??F (36.7 ??C)   Resp 16   Ht 5\' 4"  (1.626 m)   Wt 210 lb 11.2 oz (95.6 kg)   SpO2 95%   BMI 36.17 kg/m??   Appearance: alert, decreased facial expression. Ambulates with walker, in no acute distress.  General exam: CVS exam BP noted to be slightly elevated today in office, S1, S2 normal, no gallop, no murmur, chest clear, no JVD, no HSM, no edema, peripheral vascular exam both carotids normal upstroke without bruits, radial pulses normal, pedal pulses normal both DP's and PT's, neurological exam alert,   Speech seems a bit slow but is not slurred, no focal findings or movement disorder noted.    No visits with results within 3 Month(s) from this visit.   Latest known visit with results is:   Hospital Outpatient Visit on 11/16/2019   Component Date Value Ref Range Status   ??? Creatinine (POC) 11/16/2019 0.8  0.6 - 1.3 mg/dL Final   ??? GFRAA, POC 11/16/2019 >60  >60 ml/min/1.37m2 Final   ??? GFRNA, POC 11/16/2019 >60  >60 ml/min/1.53m2 Final    Estimated GFR is calculated using the IDMS-traceable Modification of Diet in Renal Disease (MDRD) Study equation, reported for both African Americans (GFRAA) and non-African Americans (GFRNA), and normalized to 1.14m2 body surface area. The physician must decide which value applies to the patient.       ASSESSMENT:   1. Parkinsonism, unspecified Parkinsonism type (HCC)    2. Essential hypertension, benign -adequate control in office   3. Hyperlipidemia, unspecified  hyperlipidemia type -LDL of 170 in 2017, no other labs available   4. Type 2 diabetes mellitus with diabetic polyneuropathy, without long-term current use of insulin (HCC) -recent A1c not documented   5. Mild intermittent asthma  -complicated by  fume exposure at home   6. Osteoarthritis of both knees, unspecified osteoarthritis type -preop   7. Lumbar spondylolysis    8. Osteoarthritis of cervical spine, unspecified spinal osteoarthritis complication status    9. 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 Diastolic dysfunction      PLAN:  .  Orders Placed This Encounter   ??? quinapriL (ACCUPRIL) 40 mg tablet refill   79 year old female with multiple risk factors for cardiovascular disease and an old left cerebellar infarct and mild cerebral atrophy on CT presents for general follow-up evaluation of left-sided weakness and short-term memory loss; weakness, according to the chart began after she was treated in the ED for flulike illness in 2019.  Patient followed up with neurology and had an unremarkable work-up.  The neurologist wanted to trial patient on Sinemet but patient did not return to the office.  Will refer patient back to neurology.    Patient did not agree to blood work today stating it was getting dark and she needs to drive home.    I have discussed the diagnosis with the patient and the intended plan as seen in the  orders above.  The patient understands and agrees with the plan.  The patient has   received an after visit summary and questions were answered concerning  future plans  Patient labs and/or xrays were reviewed  Past records were reviewed.    Counseled regarding diet, exercise and healthy lifestyle          Advised patient to proceed to urgent care, call back or return to office if symptoms worsen/change/persist.  Discussed expected course/resolution/complications of diagnosis in detail with patient.     Medication risks/benefits/costs/interactions/alternatives discussed with  patient  RTO 3 mo    Signed,  Nikki Dom M.D.    This note was created using voice recognition software.  Edits have been made but syntax errors might exist.

## 2020-03-06 ENCOUNTER — Inpatient Hospital Stay
Admit: 2020-03-06 | Discharge: 2020-03-07 | Disposition: A | Discharge status: Home / Self Care | Payer: MEDICARE | Attending: Emergency Medicine

## 2020-03-06 ENCOUNTER — Emergency Department: Admit: 2020-03-06 | Payer: MEDICARE | Primary: Adolescent Medicine

## 2020-03-06 ENCOUNTER — Telehealth

## 2020-03-06 DIAGNOSIS — R4781 Slurred speech: Secondary | ICD-10-CM

## 2020-03-06 LAB — METABOLIC PANEL, COMPREHENSIVE
A-G Ratio: 1.1 (ref 1.1–2.2)
ALT (SGPT): 21 U/L (ref 12–78)
AST (SGOT): 14 U/L — ABNORMAL LOW (ref 15–37)
Albumin: 3.9 g/dL (ref 3.5–5.0)
Alk. phosphatase: 107 U/L (ref 45–117)
Anion gap: 4 mmol/L — ABNORMAL LOW (ref 5–15)
BUN/Creatinine ratio: 22 — ABNORMAL HIGH (ref 12–20)
BUN: 24 MG/DL — ABNORMAL HIGH (ref 6–20)
Bilirubin, total: 0.2 MG/DL (ref 0.2–1.0)
CO2: 30 mmol/L (ref 21–32)
Calcium: 9.7 MG/DL (ref 8.5–10.1)
Chloride: 104 mmol/L (ref 97–108)
Creatinine: 1.09 MG/DL — ABNORMAL HIGH (ref 0.55–1.02)
GFR est AA: 59 mL/min/{1.73_m2} — ABNORMAL LOW (ref >60)
GFR est non-AA: 49 mL/min/{1.73_m2} — ABNORMAL LOW (ref >60)
Globulin: 3.7 g/dL (ref 2.0–4.0)
Glucose: 131 mg/dL — ABNORMAL HIGH (ref 65–100)
Potassium: 3.9 mmol/L (ref 3.5–5.1)
Protein, total: 7.6 g/dL (ref 6.4–8.2)
Sodium: 138 mmol/L (ref 136–145)

## 2020-03-06 LAB — CBC WITH AUTOMATED DIFF
ABS. BASOPHILS: 0.1 10*3/uL (ref 0.0–0.1)
ABS. EOSINOPHILS: 0.3 10*3/uL (ref 0.0–0.4)
ABS. IMM. GRANS.: 0 10*3/uL (ref 0.00–0.04)
ABS. LYMPHOCYTES: 3.8 10*3/uL — ABNORMAL HIGH (ref 0.8–3.5)
ABS. MONOCYTES: 0.5 10*3/uL (ref 0.0–1.0)
ABS. NEUTROPHILS: 4 10*3/uL (ref 1.8–8.0)
ABSOLUTE NRBC: 0 10*3/uL (ref 0.00–0.01)
BASOPHILS: 1 % (ref 0–1)
EOSINOPHILS: 4 % (ref 0–7)
HCT: 40.5 % (ref 35.0–47.0)
HGB: 12.6 g/dL (ref 11.5–16.0)
IMMATURE GRANULOCYTES: 0 % (ref 0.0–0.5)
LYMPHOCYTES: 43 % (ref 12–49)
MCH: 25.7 PG — ABNORMAL LOW (ref 26.0–34.0)
MCHC: 31.1 g/dL (ref 30.0–36.5)
MCV: 82.5 FL (ref 80.0–99.0)
MONOCYTES: 6 % (ref 5–13)
MPV: 12.3 FL (ref 8.9–12.9)
NEUTROPHILS: 46 % (ref 32–75)
NRBC: 0 PER 100 WBC
PLATELET: 297 10*3/uL (ref 150–400)
RBC: 4.91 M/uL (ref 3.80–5.20)
RDW: 15.7 % — ABNORMAL HIGH (ref 11.5–14.5)
WBC: 8.8 10*3/uL (ref 3.6–11.0)

## 2020-03-06 LAB — EKG, 12 LEAD, INITIAL
Atrial Rate: 69 {beats}/min
Calculated P Axis: 64 degrees
Calculated R Axis: -6 degrees
Calculated T Axis: 30 degrees
Diagnosis: NORMAL
P-R Interval: 134 ms
Q-T Interval: 380 ms
QRS Duration: 80 ms
QTC Calculation (Bezet): 407 ms
Ventricular Rate: 69 {beats}/min

## 2020-03-06 LAB — URINALYSIS W/MICROSCOPIC
Bacteria: NEGATIVE /hpf
Bilirubin: NEGATIVE
Blood: NEGATIVE
Glucose: NEGATIVE mg/dL
Ketone: NEGATIVE mg/dL
Leukocyte Esterase: NEGATIVE
Nitrites: NEGATIVE
Protein: NEGATIVE mg/dL
Specific gravity: 1.015 (ref 1.003–1.030)
Urobilinogen: 0.2 EU/dL (ref 0.2–1.0)
pH (UA): 5 (ref 5.0–8.0)

## 2020-03-06 LAB — TROPONIN I: Troponin-I, Qt.: <0.05 ng/mL (ref <0.05)

## 2020-03-06 LAB — SAMPLES BEING HELD

## 2020-03-06 LAB — URINE CULTURE HOLD SAMPLE

## 2020-03-06 LAB — EKG 12-LEAD
Atrial Rate: 69 {beats}/min
Diagnosis: NORMAL
P Axis: 64 degrees
P-R Interval: 134 ms
Q-T Interval: 380 ms
QRS Duration: 80 ms
QTc Calculation (Bazett): 407 ms
R Axis: -6 degrees
T Axis: 30 degrees
Ventricular Rate: 69 {beats}/min

## 2020-03-06 LAB — CBC WITH AUTO DIFFERENTIAL
Basophils %: 1 % (ref 0–1)
Basophils Absolute: 0.1 10*3/uL (ref 0.0–0.1)
Eosinophils %: 4 % (ref 0–7)
Eosinophils Absolute: 0.3 10*3/uL (ref 0.0–0.4)
Granulocyte Absolute Count: 0 10*3/uL (ref 0.00–0.04)
Hematocrit: 40.5 % (ref 35.0–47.0)
Hemoglobin: 12.6 g/dL (ref 11.5–16.0)
Immature Granulocytes %: 0 % (ref 0.0–0.5)
Lymphocytes %: 43 % (ref 12–49)
Lymphocytes Absolute: 3.8 10*3/uL — ABNORMAL HIGH (ref 0.8–3.5)
MCH: 25.7 PG — ABNORMAL LOW (ref 26.0–34.0)
MCHC: 31.1 g/dL (ref 30.0–36.5)
MCV: 82.5 FL (ref 80.0–99.0)
MPV: 12.3 FL (ref 8.9–12.9)
Monocytes %: 6 % (ref 5–13)
Monocytes Absolute: 0.5 10*3/uL (ref 0.0–1.0)
NRBC Absolute: 0 10*3/uL (ref 0.00–0.01)
Neutrophils %: 46 % (ref 32–75)
Neutrophils Absolute: 4 10*3/uL (ref 1.8–8.0)
Nucleated RBCs: 0 PER 100 WBC
Platelets: 297 10*3/uL (ref 150–400)
RBC: 4.91 M/uL (ref 3.80–5.20)
RDW: 15.7 % — ABNORMAL HIGH (ref 11.5–14.5)
WBC: 8.8 10*3/uL (ref 3.6–11.0)

## 2020-03-06 LAB — COMPREHENSIVE METABOLIC PANEL
ALT: 21 U/L (ref 12–78)
AST: 14 U/L — ABNORMAL LOW (ref 15–37)
Albumin/Globulin Ratio: 1.1 (ref 1.1–2.2)
Albumin: 3.9 g/dL (ref 3.5–5.0)
Alkaline Phosphatase: 107 U/L (ref 45–117)
Anion Gap: 4 mmol/L — ABNORMAL LOW (ref 5–15)
BUN/Creatinine Ratio: 22 — ABNORMAL HIGH (ref 12–20)
BUN: 24 MG/DL — ABNORMAL HIGH (ref 6–20)
CO2: 30 mmol/L (ref 21–32)
Calcium: 9.7 MG/DL (ref 8.5–10.1)
Chloride: 104 mmol/L (ref 97–108)
Creatinine: 1.09 MG/DL — ABNORMAL HIGH (ref 0.55–1.02)
GFR African American: 59 mL/min/{1.73_m2} — ABNORMAL LOW (ref >60)
Globulin: 3.7 g/dL (ref 2.0–4.0)
Glucose: 131 mg/dL — ABNORMAL HIGH (ref 65–100)
Potassium: 3.9 mmol/L (ref 3.5–5.1)
Sodium: 138 mmol/L (ref 136–145)
Total Bilirubin: 0.2 MG/DL (ref 0.2–1.0)
Total Protein: 7.6 g/dL (ref 6.4–8.2)
eGFR NON-AA: 49 mL/min/{1.73_m2} — ABNORMAL LOW (ref >60)

## 2020-03-06 LAB — URINALYSIS WITH MICROSCOPIC
BACTERIA, URINE: NEGATIVE /hpf
Bilirubin, Urine: NEGATIVE
Blood, Urine: NEGATIVE
Glucose, Ur: NEGATIVE mg/dL
Ketones, Urine: NEGATIVE mg/dL
Leukocyte Esterase, Urine: NEGATIVE
Nitrite, Urine: NEGATIVE
Protein, UA: NEGATIVE mg/dL
Specific Gravity, UA: 1.015 (ref 1.003–1.030)
Urobilinogen, UA, POCT: 0.2 EU/dL (ref 0.2–1.0)
pH, UA: 5 (ref 5.0–8.0)

## 2020-03-06 LAB — TROPONIN: Troponin I: <0.05 ng/mL (ref <0.05)

## 2020-03-06 MED ORDER — NALOXONE 0.4 MG/ML INJECTION
0.4 mg/mL | INTRAMUSCULAR | Status: DC | PRN
Start: 2020-03-06 — End: 2020-03-06

## 2020-03-06 MED ORDER — NALOXONE 0.4 MG/ML INJECTION
0.4 mg/mL | Freq: Once | INTRAMUSCULAR | Status: AC
Start: 2020-03-06 — End: 2020-03-06
  Administered 2020-03-06: 17:00:00 via INTRAVENOUS

## 2020-03-06 MED ORDER — LORAZEPAM 2 MG/ML IJ SOLN
2 mg/mL | INTRAMUSCULAR | Status: AC
Start: 2020-03-06 — End: 2020-03-06
  Administered 2020-03-06: 18:00:00 via INTRAVENOUS

## 2020-03-06 MED FILL — NALOXONE 0.4 MG/ML INJECTION: 0.4 mg/mL | INTRAMUSCULAR | Qty: 1

## 2020-03-06 MED FILL — LORAZEPAM 2 MG/ML IJ SOLN: 2 mg/mL | INTRAMUSCULAR | Qty: 1

## 2020-03-06 NOTE — ED Notes (Signed)
Pt arrives to triage via squad, pt thinks she is having a stroke, pt c/o sob in September, denies fever or chills, short term memory is gone x one year, weakness in both hands, slurred speech x months, also there are chemicals in her building that is causing her to have a sore throat and chest pain

## 2020-03-06 NOTE — ED Notes (Signed)
Went over DC instructions with pt she verbalized understanding,  Pt being transported home via stretcher.

## 2020-03-06 NOTE — ED Notes (Signed)
 Introductions made, pt resting in bed Cinn -,  After dose of narcan  pt seems unchanged.  Pt was explaining to Clinical research associate why she was being seen in the hospital, pt stated her upstairs neighbor controls me with the chemicals and when he wants me to sleep he sprays the chemicals.

## 2020-03-06 NOTE — ED Notes (Signed)
Verbal shift change report given to Megan W. RN (oncoming nurse) by Sarah RN (offgoing nurse). Report included the following information SBAR, Kardex, ED Summary, MAR and Recent Results.

## 2020-03-06 NOTE — ED Notes (Signed)
Care management at bedside

## 2020-03-06 NOTE — ED Notes (Signed)
Pt back from MRI in NAD, Bsmart at bedside

## 2020-03-06 NOTE — Progress Notes (Signed)
03/06/2020 -   Date of previous inpatient admission/ ED visit? 12/2017    What brought the patient back to ED? Concern of Stroke    Did patient decline recommended services during last admission/ ED visit (if yes, what)? N/A     Has patient seen a provider since their last inpatient admission/ED visit (if yes, when)? YES - patient followed up in 10/2019 with Neurology (Dr. Olegario Shearer), is followed by Dr. Tora Perches ([place]) and by new PCP    PCP: First and Last name: Lesli Albee   Name of Practice: [facility]   Are you a current patient: Yes/No: Yes   Approximate date of last visit: 3/3    CM Interventions: Sports coach for help setting up a [facility] appointment.  CM reviewed patient's chart and determined that in January patient's neurologist (Dr. Olegario Shearer) had wanted a referral to be sent to [facility] Neuromuscular Neurology.  CM contacted Dr. Dawna Part office [phone] to determine status of the referral and is awaiting a return call from either MD or Nurse.      CM met with patient to discuss current home environment.  Per patient, she has been a resident of [place] for about 2 years.  Patient is fully independent in ADLs, to include driving.  Home DME includes: cane, walker, wheelchair, CPAP, glucometer, and BP Cuff.  Patient's son lives in the general area.  Patient has no hx of HH or Rehab.      Patient has been concerned of stroke like symptoms over the last several months, including delayed thought processes, issues with word finding, numbness and tingling in her hands and feet, and slurred speech.  Patient has followed up with Neurology, Cardiology, and PCP.    CM has call pending to patient's Neurologist (Dr. Olegario Shearer) to determine current status of planned referral to [facility] Neurom[facility] Clinic that was planned in 12/2019.    From previous inpatient admission/ED visit: N/A  From current inpatient admission/ED visit: Awaiting return call from patient's  Neurologist for status update re: referral for [facility]    CRM: Irving Shows, MPH, [facility]; Z: [phone]    16:10 -   CM rounded with ED Attending and Senior Services NP with [facility]  Patient has extensive opioid use hx.  Patient will need [place] services.  Attending is in agreement to [facility] for Bridge to Home program, as patient has limited home support.  CM submitted referral via [employer] and notified agency [facility]) of referral for [facility] SN, PT, OT, SW evals to include Bridge to [employer].  Info is on AVS.  CM again contacted Dr. Dawna Part office.  Per nurse Denice Paradise, Dr. Olegario Shearer will be back in office the office tomorrow, 3/10, and will send the referral to [facility] Neuromuscular Neurology Clinic on 3/10.  CRM: Irving Shows, MPH, [facility]; Z: [phone]    16:30 -   Per nursing, patient is ready for discharge via stretcher. CM submitted transport request to [facility] via [employer]s with requested transport time of 17:35.  CRM: Irving Shows, MPH, [facility]; Z: [phone]    18:25 -   CM contacted [facility] ([facility] phone [phone] and was informed that patient's transport is delayed to 21:30.  ED is aware of the above.  CRM: Irving Shows, MPH, [place]; Z: [phone]

## 2020-03-06 NOTE — ED Notes (Signed)
Pt to MRI

## 2020-03-06 NOTE — ED Notes (Signed)
Per chart review, pt was seen at neurology in office back in November for same complaints.

## 2020-03-06 NOTE — ED Provider Notes (Signed)
The history is provided by the patient.   Aphasia  This is a recurrent problem. The current episode started 12 to 24 hours ago. The problem has not changed since onset.There was no focality noted. Primary symptoms include slurred speech and memory loss (short term memory difficulty).Primary symptoms comment: slowing. There has been no fever. Associated symptoms include chest pain (when she states her neighbor is spraying something that cases it). Pertinent negatives include no shortness of breath, no confusion and no headaches.        Past Medical History:   Diagnosis Date   ??? Asthma    ??? Diabetes (HCC)    ??? Fibromyalgia    ??? Hyperlipemia    ??? Hypertension    ??? Stroke [facility])        Past Surgical History:   Procedure Laterality Date   ??? HX HYSTERECTOMY           History reviewed. No pertinent family history.    Social History     Socioeconomic History   ??? Marital status: DIVORCED     Spouse name: Not on file   ??? Number of children: Not on file   ??? Years of education: Not on file   ??? Highest education level: Not on file   Occupational History   ??? Not on file   Social Needs   ??? Financial resource strain: Not on file   ??? Food insecurity     Worry: Not on file     Inability: Not on file   ??? Transportation needs     Medical: Not on file     Non-medical: Not on file   Tobacco Use   ??? Smoking status: Never Smoker   ??? Smokeless tobacco: Never Used   Substance and Sexual Activity   ??? Alcohol use: No   ??? Drug use: No   ??? Sexual activity: Not on file   Lifestyle   ??? Physical activity     Days per week: Not on file     Minutes per session: Not on file   ??? Stress: Not on file   Relationships   ??? Social Wellsite geologist on phone: Not on file     Gets together: Not on file     Attends religious service: Not on file     Active member of club or organization: Not on file     Attends meetings of clubs or organizations: Not on file     Relationship status: Not on file   ??? Intimate partner violence     Fear of current or ex partner:  Not on file     Emotionally abused: Not on file     Physically abused: Not on file     Forced sexual activity: Not on file   Other Topics Concern   ??? Not on file   Social History Narrative   ??? Not on file         ALLERGIES: Ampicillin and Statins-hmg-coa reductase inhibitors    Review of Systems   Respiratory: Negative for shortness of breath.    Cardiovascular: Positive for chest pain (when she states her neighbor is spraying something that cases it).   Neurological: Negative for headaches.   Psychiatric/Behavioral: Positive for memory loss (short term memory difficulty). Negative for confusion.   All other systems reviewed and are negative.      Vitals:    03/06/20 0942 03/06/20 1035 03/06/20 1209 03/06/20 1407   BP: Marland Kitchen)  143/68 (!) 145/103 (!) 168/63 (!) 144/64   Pulse: 78  81 68   Resp: 20  19 13   Temp: 98.1 ??F (36.7 ??C)      SpO2: 95% 96% 98% 96%            Physical Exam  Vitals signs and nursing note reviewed.   Constitutional:       General: She is not in acute distress.     Appearance: She is well-developed.   HENT:      Head: Normocephalic and atraumatic.   Eyes:      Conjunctiva/sclera: Conjunctivae normal.      Comments: Bilateral miosis and reactive pupils.   Neck:      Musculoskeletal: Neck supple.   Cardiovascular:      Rate and Rhythm: Normal rate and regular rhythm.      Heart sounds: Normal heart sounds.   Pulmonary:      Effort: Pulmonary effort is normal. No respiratory distress.      Breath sounds: Normal breath sounds.   Abdominal:      General: There is no distension.   Musculoskeletal: Normal range of motion.         General: No deformity.   Skin:     General: Skin is warm and dry.   Neurological:      Mental Status: She is alert.      Cranial Nerves: No cranial nerve deficit.      Sensory: Sensation is intact.      Motor: Motor function is intact.   Psychiatric:         Speech: Speech is slurred.         Behavior: Behavior normal.          MDM     78 y.o. female presents with recurrent  feeling of slowing and slurred speech. She is nonfocal here. She is concerned about stroke but has been seen previously for same and recommended neuromuscular specialist as next step but this did not happen. CT unchanged and MRI shows no signs of infarct, Do not suspect this is related to ischemia. She has some delusional behavior surrounding her neighbor who she believes has been aerosolizing some type of substance to give her chest pain and sore throat. Narcan did not change symptoms. I suspect there could be some polypharmacy. Cardiac workup unrevealing and no indication for inpatient admission currently as these symptoms have been on and off since September of last year and she is already followed by neurology. Case management has reached out to help expedite her outpatient evaluation with subspecialty neurologist. Plan to follow up with PCP as needed and return precautions discussed for worsening or new concerning symptoms.     Procedures    EKG 0947: Rate 69, Normal sinus rhythm, No ST segment or T wave abnormalities. Normal EKG.

## 2020-03-06 NOTE — Telephone Encounter (Signed)
Erin nurse case manager w/ [facility] calling stating pt is in ED right now and they are wondering if a referral was sent to [facility] neuromuscular neurology. Please call back asap

## 2020-03-06 NOTE — ED Notes (Addendum)
Introductions made, pt resting in bed Cinn -,  After dose of narcan pt seems unchanged.  Pt was explaining to writer why she was being seen in the hospital, pt stated her "upstairs neighbor controls me with the chemicals and when he wants me to sleep he sprays the chemicals."

## 2020-03-06 NOTE — ED Triage Notes (Signed)
Pt arrives to triage via squad, pt thinks she is having a stroke, pt c/o sob in September, denies fever or chills, short term memory is gone x one year, weakness in both hands, slurred speech x months, also there are chemicals in her building that is causing her to have a sore throat and chest pain

## 2020-03-06 NOTE — Progress Notes (Addendum)
03/06/2020 -   Date of previous inpatient admission/ ED visit? 12/2017    What brought the patient back to ED? Concern of Stroke    Did patient decline recommended services during last admission/ ED visit (if yes, what)? N/A     Has patient seen a provider since their last inpatient admission/ED visit (if yes, when)? YES - patient followed up in 10/2019 with Neurology (Dr. Edmonson), is followed by Dr. Michael Erwin (Cardio) and by new PCP    PCP: First and Last name: Cheryl Belle   Name of Practice: [facility]   Are you a current patient: Yes/No: Yes   Approximate date of last visit: 3/3    CM Interventions: Senior Services Consult for help setting up a [facility] appointment.  CM reviewed patient's chart and determined that in January patient's neurologist (Dr. Edmonson) had wanted a referral to be sent to [facility].  CM contacted Dr. Edmonson's office [phone]) to determine status of the referral and is awaiting a return call from either MD or Nurse.      CM met with patient to discuss current home environment.  Per patient, she has been a resident of [place] for about 2 years.  Patient is fully independent in ADLs, to include driving.  Home DME includes: cane, walker, wheelchair, CPAP, glucometer, and BP Cuff.  Patient's son lives in the general area.  Patient has no hx of HH or Rehab.      Patient has been concerned of stroke like symptoms over the last several months, including delayed thought processes, issues with word finding, numbness and tingling in her hands and feet, and slurred speech.  Patient has followed up with Neurology, Cardiology, and PCP.    CM has call pending to patient's Neurologist (Dr. Edmonson) to determine current status of planned referral to [facility] that was planned in 12/2019.    From previous inpatient admission/ED visit: N/A  From current inpatient admission/ED visit: Awaiting return call from patient's  Neurologist for status update re: referral for [facility]    CRM: Eiren Childress, MPH, [facility]; Z: [phone]    16:10 -   CM rounded with ED Attending and Senior Services NP with [facility].  Patient has extensive opioid use hx.  Patient will need HH services.  Attending is in agreement to [facility] for Bridge to Home program, as patient has limited home support.  CM submitted referral via All Scripts and notified agency ([facility]) of referral for HH SN, PT, OT, SW evals to include Bridge to Home Program.  Info is on AVS.  CM again contacted Dr. Edmonson's office.  Per nurse Jo, Dr. Edmonson will be back in office the office tomorrow, 3/10, and will send the referral to [facility] on 3/10.  CRM: Eiren Childress, MPH, [facility]; Z: [phone]    16:30 -   Per nursing, patient is ready for discharge via stretcher. CM submitted transport request to [facility] via [facility] with requested transport time of 17:35.  CRM: Eiren Childress, MPH, [facility]; Z: [phone]    18:25 -   CM contacted [facility] phone [phone] and was informed that patient's transport is delayed to 21:30.  ED is aware of the above.  CRM: Eiren Childress, MPH, [facility]; Z: [phone]

## 2020-03-06 NOTE — Other (Signed)
TRST 3, SSED Visit.     Patient greeted in room able to state name, DOB, patient very drowsy and conversation varies. I introduced myself and role as SSED NP.   Will start answering the question appropriately and then change the conversation abruptly and completely off topic. Discussion with Dr Clydene Pugh, patient seen for slurred speech and aphasia(  this is patients 4th event over the last 6 months+.   Highly concerned for opioid misuse, BSmart Becky present to call APS. Not appropriate for Geriatric Screenings at this time due to drowsiness.     Patient stating that she had a falling out with Adopted Son that lives in [place]- she moved out 2 years ago and currently receives no support from him.   Patients daughter lives in [place] and is currently going through a nasty divorce- receives no current support from daughter.     Patient endorses that she is independent with ADLs/IADLs, however has had difficulty with:  -Managing her Medication Administration- difficulty filling pill box.  -Difficulty getting groceries, going to try and order online.  -Difficulty paying her bills, but trying to manage.      -Patient states that she cannot rely on her children for assistance and that she does not have the extra money to pay for caregivers to come into the home to help.   -Patient was given resources for Outpatient Psychiatric help, Senior Connections, Referral placed by Denny Peon, CM for [facility] to help bridge going home with resources.     I will collaborate with Javier Glazier, Denny Peon, CM and Dr Clydene Pugh for additional resources to best care for patient. Thank you for the SSED Consult and letting me assist with patient care.    Jamie Bryant Nobie Putnam, NP  SSED  5:04 PM

## 2020-03-06 NOTE — ED Notes (Signed)
Pt back from MRI in NAD, Bsmart at bedside

## 2020-03-06 NOTE — ED Notes (Signed)
Went over DC instructions with pt she verbalized understanding,  Pt being transported home via stretcher.
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
## 2020-03-06 NOTE — ED Notes (Signed)
Pt to CT via stretcher

## 2020-03-06 NOTE — ED Notes (Signed)
Per chart review, pt was seen at neurology in office back in November for same complaints.

## 2020-03-06 NOTE — Telephone Encounter (Signed)
Please place referral for [facility]. Thanks

## 2020-03-06 NOTE — Progress Notes (Signed)
Senior Services Note by Lynett Fish, Micala Saltsman Judie Petit, NP at 03/06/20 1616                Author: Lynett Fish, Hubbard Robinson, NP  Service: Emergency Medicine  Author Type: Nurse Practitioner       Filed: 03/06/20 1705  Date of Service: 03/06/20 1616  Status: Attested           Editor: Lynett Fish, Chantia Amalfitano Judie Petit, NP (Nurse Practitioner)  Cosigner: Truett Mainland, MD at 03/06/20 1955          Attestation signed by Truett Mainland, MD at 03/06/20 1955          I personally saw and examined the patient.  I have reviewed and agree with the MLP's findings, including all diagnostic interpretations, and plans as written.    I was present during the key portions of separately billed procedures.     Truett Mainland, MD                                  TRST 3, SSED Visit.       Patient greeted in room able to state name, DOB, patient very drowsy and conversation varies. I introduced myself and role as SSED NP.    Will start answering the question appropriately and then change the conversation abruptly and completely off topic. Discussion with Dr Clydene Pugh, patient seen for slurred speech and aphasia(  this is patients 4th event over the last 6 months+.    Highly concerned for opioid misuse, [facility] Becky present to call APS. Not appropriate for Geriatric Screenings at this time due to drowsiness.       Patient stating that she had a falling out with Adopted Son that lives in [place]- she moved out 2 years ago and currently receives no support from him.    Patients daughter lives in [place] and is currently going through a nasty divorce- receives no current support from daughter.       Patient endorses that she is independent with ADLs/IADLs, however has had difficulty with:   -Managing her Medication Administration- difficulty filling pill box.   -Difficulty getting groceries, going to try and order online.   -Difficulty paying her bills, but trying to manage.       -Patient states that she cannot rely on her children for assistance and that she  does not have the extra money to pay for caregivers to come into the home to help.    -Patient was given resources for Outpatient Psychiatric help, [facility], Referral placed by Denny Peon, CM for [facility] to help bridge going home with resources.       I will collaborate with Javier Glazier, Denny Peon, CM and Dr Clydene Pugh for additional resources to best care for patient. Thank you for the SSED Consult and letting me assist with patient care.      Sigrid Schwebach Nobie Putnam, NP   SSED   5:04 PM
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
## 2020-03-07 NOTE — Progress Notes (Unsigned)
Call placed to Dr.Edmonson's office spoke w/ Lyla Son - informed office faxed (03/07/20) referral to [facility] and confirmation received today.

## 2020-03-07 NOTE — Telephone Encounter (Signed)
Done

## 2020-03-07 NOTE — Telephone Encounter (Signed)
Referral and notes have been faxed and confirmation was received.

## 2020-03-15 ENCOUNTER — Inpatient Hospital Stay: Payer: MEDICARE | Attending: Adolescent Medicine | Primary: Adolescent Medicine

## 2020-03-23 NOTE — Telephone Encounter (Signed)
ERROR

## 2020-03-30 MED ORDER — ESTRADIOL 1 MG TAB
1 mg | ORAL_TABLET | Freq: Every day | ORAL | 1 refills | Status: DC
Start: 2020-03-30 — End: 2020-08-30

## 2020-04-10 ENCOUNTER — Ambulatory Visit: Attending: Adolescent Medicine | Primary: Adolescent Medicine

## 2020-04-10 ENCOUNTER — Ambulatory Visit
Admit: 2020-04-10 | Discharge: 2020-04-10 | Payer: MEDICARE | Attending: Adolescent Medicine | Primary: Adolescent Medicine

## 2020-04-10 DIAGNOSIS — R4781 Slurred speech: Secondary | ICD-10-CM

## 2020-04-10 NOTE — Progress Notes (Signed)
 Jamie Bryant is a 79 y.o. female    Chief Complaint   Patient presents with   . Follow-up     1. Have you been to the ER, urgent care clinic since your last visit?  Hospitalized since your last visit? No      2. Have you seen or consulted any other health care providers outside of the [facility] since your last visit?  Include any pap smears or colon screening. No
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
## 2020-04-10 NOTE — Progress Notes (Signed)
Jamie Bryant is a 78 y.o. female    Chief Complaint   Patient presents with   ??? Follow-up     1. Have you been to the ER, urgent care clinic since your last visit?  Hospitalized since your last visit? No      2. Have you seen or consulted any other health care providers outside of the [facility] since your last visit?  Include any pap smears or colon screening. No
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
## 2020-04-10 NOTE — Patient Instructions (Signed)
Parkinson's Disease: Care Instructions  Overview  When you have Parkinson's disease, part of your brain cannot make enough dopamine, a chemical that helps control movement. The disease can cause tremors, stiffness, and problems with movement. Severe or advanced cases can also cause problems with thinking. Taking your medicines correctly and getting regular exercise may help you maintain your quality of life.  There are many things that can cause Parkinson's disease symptoms, including some medicine, some toxins, and trauma to the head. The cause in most cases is not known.  Follow-up care is a key part of your treatment and safety. Be sure to make and go to all appointments, and call your doctor if you are having problems. It's also a good idea to know your test results and keep a list of the medicines you take.  How can you care for yourself at home?  General care  ?? Take your medicines exactly as prescribed. Call your doctor if you think you are having a problem with your medicine.  ?? Make sure your home is safe:  ? Place furniture so that you have something to hold on to as you walk around the house.  ? Use chairs that make it easier to sit down and stand up.  ? Group the things you use most, such as reading glasses, keys, and the telephone, in one easy-to-reach place.  ? Tack down rugs so that you do not trip.  ? Put no-slip tape and handrails in the tub to prevent falls.  ?? Use a cane, walker, or scooter if your doctor suggests it.  ?? Keep up your normal activities as much as you can.  ?? Find ways to manage stress, which can make symptoms worse.  ?? Spend time with family and friends. Join a support group for people with Parkinson's disease if you want extra help.  ?? Depression is common with this condition. Tell your doctor if you have trouble sleeping, are eating too much or are not hungry, or feel sad or tearful all the time. Depression can be treated with medicine and counseling.  Diet and exercise  ??  Eat a balanced diet.  ?? If you are taking levodopa, do not eat protein at the same time you take your medicine. Levodopa may not work as well if you take it at the same time you eat protein. You can eat normal amounts of protein. Talk to your doctor if you have questions.  ?? If you have problems swallowing, change how and what you eat:  ? Try thick drinks, such as milk shakes. They are easier to swallow than other fluids.  ? Do not eat foods that crumble easily. These can cause choking.  ? Use a blender to prepare food. Soft foods need less chewing.  ? Eat small meals often so that you do not get tired from eating heavy meals.  ?? Drink plenty of water and eat a high-fiber diet to prevent constipation. Parkinson's???and the medicines that treat it???may slow your intestines.  ?? Get exercise on most days. Work with your doctor to set up a program of walking, swimming, or other exercise you are able to do.  When should you call for help?   Call your doctor now or seek immediate medical care if:  ?? ?? You have a change in your symptoms.   ?? ?? You develop other problems from your condition, such as:  ? Injury from a fall.  ? Thinking or memory problems.  ? A   urinary tract infection (burning pain when urinating).   Watch closely for changes in your health, and be sure to contact your doctor if:  ?? ?? You lose weight because of problems with eating.   ?? ?? You want more information about your condition or your medicines.   Where can you learn more?  Go to https://www.healthwise.net/[employer]  Enter N399 in the search box to learn more about "Parkinson's Disease: Care Instructions."  Current as of: August 02, 2019??????????????????????????????Content Version: 12.8  ?? 2006-2021 [employer].   Care instructions adapted under license by [employer] (which disclaims liability or warranty for this information). If you have questions about a medical condition or this instruction, always ask your healthcare professional.  Healthwise, Inc[employer] disclaims any warranty or liability for your use of this information.

## 2020-06-06 ENCOUNTER — Encounter: Attending: Adolescent Medicine | Primary: Adolescent Medicine

## 2020-06-18 ENCOUNTER — Encounter: Attending: Adolescent Medicine | Primary: Adolescent Medicine

## 2020-06-20 NOTE — Telephone Encounter (Signed)
Kashif with [facility] reported patient had a fall yesterday. Patient fell alsleep on the toilet and fell. EMT was called and and she was transported to the hospital. And, she was discharged today. Please follow up as necessary. Thanks!

## 2020-06-26 ENCOUNTER — Ambulatory Visit: Attending: Adolescent Medicine | Primary: Adolescent Medicine

## 2020-06-26 ENCOUNTER — Encounter

## 2020-06-26 ENCOUNTER — Ambulatory Visit
Admit: 2020-06-26 | Discharge: 2020-06-26 | Payer: MEDICARE | Attending: Adolescent Medicine | Primary: Adolescent Medicine

## 2020-06-26 DIAGNOSIS — Z0000 Encounter for general adult medical examination without abnormal findings: Secondary | ICD-10-CM
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
## 2020-07-12 ENCOUNTER — Telehealth: Attending: Adolescent Medicine | Primary: Adolescent Medicine

## 2020-07-12 ENCOUNTER — Telehealth: Admit: 2020-07-12 | Payer: MEDICARE | Attending: Adolescent Medicine | Primary: Adolescent Medicine

## 2020-07-12 DIAGNOSIS — H93239 Hyperacusis, unspecified ear: Secondary | ICD-10-CM

## 2020-07-12 NOTE — Progress Notes (Signed)
Jamie Bryant (DOB: [birth year]) is a 79 y.o. female, established patient, here for evaluation of the following chief complaint(s):   Ankle Pain (pain is radiating up to knee, still swollen and tender to touch.) and Head Injury (still has not gone away)       ASSESSMENT/PLAN:  Below is the assessment and plan developed based on review of pertinent labs, studies, and medications.    1. Contusion of scalp, subsequent encounter-improved  2. Acute ankle pain, unspecified laterality-improved  3. Hearing abnormally acute, unspecified laterality  4. Bradykinesia-under investigation    Patient will continue heat to scalp and ankle and follow-up with neurology as planned.  Refer to ENT for hearing evaluation.    I have discussed the diagnosis with the patient and the intended plan as seen in the  orders above.  The patient understands and agees with the plan.  All questions the patient posed were answered.  Patient labs and/or xrays were reviewed  Past records were reviewed.Advised patient to call back or return to office if symptoms worsen/change/persist.  Discussed expected course/resolution/complications of diagnosis in detail with patient.     Medication risks/benefits/costs/interactions/alternatives discussed with patient  Return in about 3 months (around 10/12/2020).    SUBJECTIVE/OBJECTIVE:  HPI  Established patient for follow-up evaluation.    Evaluated at last visit for scalp contusion during fall.  Symptoms are better but a small lump persists and minor soreness persists.  Patient reports muffled sound in her ears.  She has not had a hearing test in a while.    Patient also injured ankle during the fall.  Symptoms are better but soreness persists.  Patient is moving the ankle freely and there is no swelling.  Gait is normal.  Weightbearing is normal.    Parkinson's suspect.  Followed by neurology.  Patient has a test in October.  She is not taking Sinemet.      Review of Systems     No flowsheet data  found.    Physical Exam            Jamie Bryant, was evaluated through a synchronous (real-time) audio-video encounter. The patient (or guardian if applicable) is aware that this is a billable service. Verbal consent to proceed has been obtained within the past 12 months. The visit was conducted pursuant to the emergency declaration under the [employer] and the [employer], 1135 waiver authority and the [employer] Act.  Patient identification was verified, and a caregiver was present when appropriate. The patient was located in a state where the provider was credentialed to provide care.      An electronic signature was used to authenticate this note.  -- Mallie Snooks, MD

## 2020-07-31 ENCOUNTER — Inpatient Hospital Stay: Admit: 2020-07-31 | Payer: MEDICARE | Attending: Adolescent Medicine | Primary: Adolescent Medicine

## 2020-07-31 DIAGNOSIS — Z1231 Encounter for screening mammogram for malignant neoplasm of breast: Secondary | ICD-10-CM

## 2020-08-15 MED ORDER — OMEPRAZOLE 40 MG CAP, DELAYED RELEASE
40 mg | ORAL_CAPSULE | Freq: Every day | ORAL | 3 refills | Status: DC
Start: 2020-08-15 — End: 2020-12-04

## 2020-08-30 MED ORDER — ESTRADIOL 1 MG TAB
1 mg | ORAL_TABLET | Freq: Every day | ORAL | 3 refills | Status: DC
Start: 2020-08-30 — End: 2021-10-09

## 2020-08-30 MED ORDER — ESTRADIOL 1 MG TAB
1 mg | ORAL_TABLET | Freq: Every day | ORAL | 1 refills | Status: DC
Start: 2020-08-30 — End: 2020-08-30

## 2020-10-11 ENCOUNTER — Encounter: Attending: Adolescent Medicine | Primary: Adolescent Medicine

## 2020-10-18 ENCOUNTER — Ambulatory Visit: Attending: Adolescent Medicine | Primary: Adolescent Medicine

## 2020-10-18 ENCOUNTER — Ambulatory Visit
Admit: 2020-10-18 | Discharge: 2020-10-18 | Payer: MEDICARE | Attending: Adolescent Medicine | Primary: Adolescent Medicine

## 2020-10-18 DIAGNOSIS — I10 Essential (primary) hypertension: Secondary | ICD-10-CM

## 2020-10-18 MED ORDER — AMLODIPINE 5 MG TAB
5 mg | ORAL_TABLET | Freq: Every day | ORAL | 1 refills | Status: DC
Start: 2020-10-18 — End: 2021-02-08

## 2020-10-18 NOTE — Progress Notes (Signed)
SPORTS MEDICINE AND PRIMARY CARE  [name]. Laiyah Exline,MD  [address] [name] [address]    Chief Complaint   Patient presents with   ??? Follow Up Chronic Condition   ??? Hypertension   ??? Diabetes       SUBJECTIVE:    LAKETRA BOWDISH is a 79 y.o. female who presents for follow up of diabetes, hypertension and hyperlipidemia.  Followed by cardiology and endocrinology(last visit was virtual)    Diet and Lifestyle: generally follows a low fat low cholesterol diet, generally follows a low sodium diet, follows a diabetic diet regularly, sedentary, nonsmoker  Home BP Monitoring: is not measured at home    Cardiovascular ROS: home bp elevated repeatedly following amlodipine discontinuation for leg swelling , taking other medications as instructed, no medication side effects noted, no TIA's, no chest pain on exertion, no dyspnea on exertion, no swelling of ankles, no orthostatic dizziness or lightheadedness, no orthopnea or paroxysmal nocturnal dyspnea, no palpitations, no muscle aches or pain.     Mammogram review, 2021  Breast calcifications, benign pattern, follow-up in 12 months advised    s-p lower back procedure which helped chronic pain  Leg /hand cramps +arthritis +polyneuropathy of DM  No recent lab work    Had bad reaction following Zostrix vaccines  covid booster scheduled oct 30    Current Outpatient Medications   Medication Sig Dispense Refill   ??? amLODIPine (NORVASC) 5 mg tablet Take 1 Tablet by mouth daily. Indicated for high blood pressure 90 Tablet 1   ??? estradioL (ESTRACE) 1 mg tablet TAKE 1 TABLET BY MOUTH DAILY 90 Tablet 3   ??? omeprazole (PRILOSEC) 40 mg capsule Take 1 Capsule by mouth daily. 30 Capsule 3   ??? quinapriL (ACCUPRIL) 40 mg tablet Take 1 Tab by mouth nightly. Indication: high blood pressure 90 Tab 1   ??? SITagliptin (Januvia) 100 mg tablet Take 1 Tab by mouth daily. Indication: diabetes 30 Tab 0   ??? pregabalin (LYRICA) 150 mg capsule Take 150 mg by mouth three (3) times daily.     ???  HYDROcodone-acetaminophen (NORCO) 10-325 mg tablet Take 1 Tab by mouth three (3) times daily.     ??? torsemide (DEMADEX) 20 mg tablet Take  by mouth daily.     ??? linaclotide (LINZESS) 290 mcg cap capsule Take  by mouth Daily (before breakfast).     ??? cholecalciferol, VITAMIN D3, (VITAMIN D3) 5,000 unit tab tablet Take  by mouth daily.     ??? co-enzyme Q-10 (COQ-10) 100 mg capsule Take 100 mg by mouth daily.     ??? aspirin 81 mg chewable tablet Take 81 mg by mouth daily.     ??? MV,CAL,MIN/IRON/FOLIC ACID/LUT (COMPLETE MULTI PO) Take  by mouth.     ??? beclomethasone (QVAR) 40 mcg/actuation aero Take 1 Puff by inhalation two (2) times a day.     ??? TRIAMCINOLONE ACETONIDE (NASACORT NA) by Nasal route.     ??? olopatadine (PATANOL) 0.1 % ophthalmic solution Administer 2 Drops to both eyes two (2) times a day.     ??? DOCUSATE CALCIUM (STOOL SOFTENER PO) Take  by mouth three (3) times daily as needed.     ??? AMLODIPINE BESYLATE, BULK, Take 5 mg by mouth daily. (Patient not taking: Reported on 10/18/2020)       Past Medical History:   Diagnosis Date   ??? Asthma    ??? Diabetes (HCC)    ??? Fibromyalgia    ???  Hyperlipemia    ??? Hypertension    ??? Stroke [facility])      Past Surgical History:   Procedure Laterality Date   ??? HX COLONOSCOPY     ??? HX HYSTERECTOMY       Allergies   Allergen Reactions   ??? Ampicillin Rash   ??? Statins-Hmg-Coa Reductase Inhibitors Myalgia     Lovastatin, pravastatin, simvastatin, atorvastatin, zetia, livalo, lovaza       REVIEW OF SYSTEMS:  General: negative for - chills or fever  ENT: negative for - headaches, nasal congestion, tinnitus, hearing loss, vision changes, sore throat  Respiratory: negative for - cough, hemoptysis, shortness of breath or wheezing  Cardiovascular : negative for - chest pain, edema, palpitations or shortness of breath  Gastrointestinal: negative for - abdominal pain, blood in stools, heartburn or nausea/vomiting, diarrhea, constipation  Genito-Urinary: no dysuria, trouble voiding, hematuria or  erectile dysfunction  Musculoskeletal: negative for - gait disturbance, joint pain, joint stiffness , joint swelling, muscle aches  Neurological: no TIA or stroke symptoms  Hematologic: no bruises, no bleeding, no swollen glands  Integument: no lumps, mole changes, nail changes or rash  Endocrine:no malaise/lethargy or unexpected weight changes      Social History     Socioeconomic History   ??? Marital status: DIVORCED     Spouse name: Not on file   ??? Number of children: Not on file   ??? Years of education: Not on file   ??? Highest education level: Not on file   Tobacco Use   ??? Smoking status: Never Smoker   ??? Smokeless tobacco: Never Used   Substance and Sexual Activity   ??? Alcohol use: No   ??? Drug use: No     Social Determinants of Psychologist, prison and probation services Strain:    ??? Difficulty of Paying Living Expenses:    Food Insecurity:    ??? Worried About Programme researcher, broadcasting/film/video in the Last Year:    ??? Barista in the Last Year:    Transportation Needs:    ??? Freight forwarder (Medical):    ??? Lack of Transportation (Non-Medical):    Physical Activity:    ??? Days of Exercise per Week:    ??? Minutes of Exercise per Session:    Stress:    ??? Feeling of Stress :    Social Connections:    ??? Frequency of Communication with Friends and Family:    ??? Frequency of Social Gatherings with Friends and Family:    ??? Attends Religious Services:    ??? Database administrator or Organizations:    ??? Attends Banker Meetings:    ??? Marital Status:      Family History   Problem Relation Age of Onset   ??? Breast Cancer Maternal Grandmother        OBJECTIVE:     Visit Vitals  BP (!) 150/71   Pulse (!) 57   Temp 98.9 ??F (37.2 ??C) (Oral)   Resp 20   Ht 5\' 4"  (1.626 m)   Wt 219 lb 11.2 oz (99.7 kg)   SpO2 96%   BMI 37.71 kg/m??     Appearance: alert, well appearing, and in no distress.  General exam: CVS exam BP noted to be elevated today in office, S1, S2 normal, no gallop, no murmur, chest clear, no JVD, no HSM, no edema, diabetic exam  feet: warm, good capillary refill and normal monofilament exam, peripheral  vascular exam both carotids normal upstroke without bruits, radial pulses normal, pedal pulses normal both DP's and PT's, neurological exam alert, oriented, normal speech, no focal findings or movement disorder noted.      ASSESSMENT:   1. Elevated blood pressure reading with diagnosis of hypertension    2. Needs flu shot    3. Hyperlipidemia, unspecified hyperlipidemia type - no recent lipid panel on file   4. Encounter for long-term (current) use of other medications    5. Type 2 diabetes mellitus with other neurologic complication, with long-term current use of insulin (HCC) -last a1c below 8%   6. Severe obesity (BMI 35.0-35.9 with comorbidity) (HCC) , interim weight gain   7. Postmenopausal    8. Type 2 diabetes mellitus with diabetic polyneuropathy, without long-term current use of insulin (HCC)    9. Leg cramps - consider electrolyte abnormality, PAD, RLS   10. Therapeutic drug monitoring    11. Vitamin D deficiency - on supplement   12. Iron deficiency history   13. Encounter for hepatitis C screening test for low risk patient    14.    Benign breast calcifications on this year's mammogram      PLAN:  .  Orders Placed This Encounter   ??? DEXA BONE DENSITY STUDY AXIAL   ??? Influenza Vaccine, QUAD, 65 Yrs +  IM  (Fluad 16553 )   ??? HEMOGLOBIN A1C WITH EAG   ??? METABOLIC PANEL, COMPREHENSIVE   ??? MICROALBUMIN, UR, RAND W/ MICROALB/CREAT RATIO   ??? LIPID PANEL   ??? URINALYSIS W/ RFLX MICROSCOPIC   ??? VITAMIN D, 25 HYDROXY   ??? MAGNESIUM   ??? METABOLIC PANEL, COMPREHENSIVE   ??? IRON   ??? CBC W/O DIFF   ??? HEMOGLOBIN A1C WITH EAG   ??? URINALYSIS W/ RFLX MICROSCOPIC   ??? MICROALBUMIN, UR, RAND W/ MICROALB/CREAT RATIO   ??? LIPID PANEL   ??? HEPATITIS C AB   ??? amLODIPine (NORVASC) 5 mg tablet reinitiation   Patient continue other medication management and lifestyle modification  Recall bp in 2 wks  RTO 3 mo    I have discussed the diagnosis with the patient and  the intended plan as seen in the  orders above.  The patient understands and agrees with the plan.  The patient has   received an after visit summary. Questions were answered concerning  future plans  Patient labs and/or xrays were reviewed as available.  Past records were reviewed as available.    Counseled regarding diet, exercise and healthy lifestyle          Advised patient to proceed to urgent care, call back or return to office if symptoms develop/worsen/change/persist.  Discussed expected course/resolution/complications of diagnosis in detail with patient.     Medication risks/benefits/costs/interactions/alternatives discussed with patient    Signed,  Irving Copas M.D.      This note was created using voice recognition software.  Edits have been made but syntax errors might exist.

## 2020-10-18 NOTE — Progress Notes (Signed)
1. Have you been to the ER, urgent care clinic since your last visit?  Hospitalized since your last visit?No    2. Have you seen or consulted any other health care providers outside of the [facility] since your last visit?  Include any pap smears or colon screening. No  Chief Complaint   Patient presents with   . Follow Up Chronic Condition   . Hypertension   . Diabetes     Visit Vitals  BP (!) 150/71   Pulse (!) 57   Temp 98.9 F (37.2 C) (Oral)   Resp 20   Ht 5\' 4"  (1.626 m)   Wt 219 lb 11.2 oz (99.7 kg)   SpO2 96%   BMI 37.71 kg/m

## 2020-10-19 LAB — CBC W/O DIFF
ABSOLUTE NRBC: 0 10*3/uL (ref 0.00–0.01)
HCT: 36.4 % (ref 35.0–47.0)
HGB: 11 g/dL — ABNORMAL LOW (ref 11.5–16.0)
MCH: 26.8 PG (ref 26.0–34.0)
MCHC: 30.2 g/dL (ref 30.0–36.5)
MCV: 88.6 FL (ref 80.0–99.0)
MPV: 12.7 FL (ref 8.9–12.9)
NRBC: 0 PER 100 WBC
PLATELET: 237 10*3/uL (ref 150–400)
RBC: 4.11 M/uL (ref 3.80–5.20)
RDW: 16.2 % — ABNORMAL HIGH (ref 11.5–14.5)
WBC: 7.3 10*3/uL (ref 3.6–11.0)

## 2020-10-19 LAB — METABOLIC PANEL, COMPREHENSIVE
A-G Ratio: 1.1 (ref 1.1–2.2)
ALT (SGPT): 29 U/L (ref 12–78)
AST (SGOT): 12 U/L — ABNORMAL LOW (ref 15–37)
Albumin: 3.3 g/dL — ABNORMAL LOW (ref 3.5–5.0)
Alk. phosphatase: 100 U/L (ref 45–117)
Anion gap: 3 mmol/L — ABNORMAL LOW (ref 5–15)
BUN/Creatinine ratio: 18 (ref 12–20)
BUN: 14 MG/DL (ref 6–20)
Bilirubin, total: 0.2 MG/DL (ref 0.2–1.0)
CO2: 31 mmol/L (ref 21–32)
Calcium: 9.5 MG/DL (ref 8.5–10.1)
Chloride: 108 mmol/L (ref 97–108)
Creatinine: 0.76 MG/DL (ref 0.55–1.02)
GFR est AA: >60 mL/min/{1.73_m2} (ref >60)
GFR est non-AA: >60 mL/min/{1.73_m2} (ref >60)
Globulin: 3 g/dL (ref 2.0–4.0)
Glucose: 121 mg/dL — ABNORMAL HIGH (ref 65–100)
Potassium: 4.4 mmol/L (ref 3.5–5.1)
Protein, total: 6.3 g/dL — ABNORMAL LOW (ref 6.4–8.2)
Sodium: 142 mmol/L (ref 136–145)

## 2020-10-19 LAB — URINALYSIS W/ RFLX MICROSCOPIC
Bilirubin, Urine: NEGATIVE
Bilirubin: NEGATIVE
Blood, Urine: NEGATIVE
Blood: NEGATIVE
Glucose, Ur: NEGATIVE mg/dL
Glucose: NEGATIVE mg/dL
Ketone: NEGATIVE mg/dL
Ketones, Urine: NEGATIVE mg/dL
Leukocyte Esterase, Urine: NEGATIVE
Leukocyte Esterase: NEGATIVE
Nitrite, Urine: NEGATIVE
Nitrites: NEGATIVE
Protein, UA: NEGATIVE mg/dL
Protein: NEGATIVE mg/dL
Specific Gravity, UA: 1.026 (ref 1.003–1.030)
Specific gravity: 1.026 (ref 1.003–1.030)
Urobilinogen, UA, POCT: 0.2 EU/dL (ref 0.2–1.0)
Urobilinogen: 0.2 EU/dL (ref 0.2–1.0)
pH (UA): 5.5 (ref 5.0–8.0)
pH, UA: 5.5 (ref 5.0–8.0)

## 2020-10-19 LAB — VITAMIN D, 25 HYDROXY: Vitamin D 25-Hydroxy: 29.9 ng/mL — ABNORMAL LOW (ref 30–100)

## 2020-10-19 LAB — LIPID PANEL
CHOL/HDL Ratio: 2.5 (ref 0.0–5.0)
Chol/HDL Ratio: 2.5 (ref 0.0–5.0)
Cholesterol, Total: 141 MG/DL (ref <200)
Cholesterol, total: 141 MG/DL (ref <200)
HDL Cholesterol: 57 MG/DL
HDL: 57 MG/DL
LDL Calculated: 59.4 MG/DL (ref 0–100)
LDL, calculated: 59.4 MG/DL (ref 0–100)
Triglyceride: 123 MG/DL (ref <150)
Triglycerides: 123 MG/DL (ref <150)
VLDL Cholesterol Calculated: 24.6 MG/DL
VLDL, calculated: 24.6 MG/DL

## 2020-10-19 LAB — MICROALBUMIN, UR, RAND W/ MICROALB/CREAT RATIO
Creatinine, urine random: 198 mg/dL
Microalbumin,urine random: 2.05 MG/DL
Microalbumin/Creat ratio (mg/g creat): 10 mg/g (ref 0–30)

## 2020-10-19 LAB — MAGNESIUM
Magnesium: 2 mg/dL (ref 1.6–2.4)
Magnesium: 2 mg/dL (ref 1.6–2.4)

## 2020-10-19 LAB — IRON
Iron: 36 ug/dL (ref 35–150)
Iron: 36 ug/dL (ref 35–150)

## 2020-10-19 LAB — HEMOGLOBIN A1C WITH EAG
Est. average glucose: 148 mg/dL
Hemoglobin A1c: 6.8 % — ABNORMAL HIGH (ref 4.0–5.6)

## 2020-10-19 LAB — VITAMIN D 25 HYDROXY: Vit D, 25-Hydroxy: 29.9 ng/mL — ABNORMAL LOW (ref 30–100)

## 2020-10-19 LAB — HEMOGLOBIN A1C W/EAG
Estimated Avg Glucose: 148 mg/dL
Hemoglobin A1C: 6.8 % — ABNORMAL HIGH (ref 4.0–5.6)

## 2020-10-19 LAB — COMPREHENSIVE METABOLIC PANEL
ALT: 29 U/L (ref 12–78)
AST: 12 U/L — ABNORMAL LOW (ref 15–37)
Albumin/Globulin Ratio: 1.1 (ref 1.1–2.2)
Albumin: 3.3 g/dL — ABNORMAL LOW (ref 3.5–5.0)
Alkaline Phosphatase: 100 U/L (ref 45–117)
Anion Gap: 3 mmol/L — ABNORMAL LOW (ref 5–15)
BUN/Creatinine Ratio: 18 (ref 12–20)
BUN: 14 MG/DL (ref 6–20)
CO2: 31 mmol/L (ref 21–32)
Calcium: 9.5 MG/DL (ref 8.5–10.1)
Chloride: 108 mmol/L (ref 97–108)
Creatinine: 0.76 MG/DL (ref 0.55–1.02)
GFR African American: >60 mL/min/{1.73_m2} (ref >60)
Globulin: 3 g/dL (ref 2.0–4.0)
Glucose: 121 mg/dL — ABNORMAL HIGH (ref 65–100)
Potassium: 4.4 mmol/L (ref 3.5–5.1)
Sodium: 142 mmol/L (ref 136–145)
Total Bilirubin: 0.2 MG/DL (ref 0.2–1.0)
Total Protein: 6.3 g/dL — ABNORMAL LOW (ref 6.4–8.2)
eGFR NON-AA: >60 mL/min/{1.73_m2} (ref >60)

## 2020-10-19 LAB — CBC
Hematocrit: 36.4 % (ref 35.0–47.0)
Hemoglobin: 11 g/dL — ABNORMAL LOW (ref 11.5–16.0)
MCH: 26.8 PG (ref 26.0–34.0)
MCHC: 30.2 g/dL (ref 30.0–36.5)
MCV: 88.6 FL (ref 80.0–99.0)
MPV: 12.7 FL (ref 8.9–12.9)
NRBC Absolute: 0 10*3/uL (ref 0.00–0.01)
Nucleated RBCs: 0 PER 100 WBC
Platelets: 237 10*3/uL (ref 150–400)
RBC: 4.11 M/uL (ref 3.80–5.20)
RDW: 16.2 % — ABNORMAL HIGH (ref 11.5–14.5)
WBC: 7.3 10*3/uL (ref 3.6–11.0)

## 2020-10-19 LAB — MICROALBUMIN / CREATININE URINE RATIO
Creatinine, Ur: 198 mg/dL
Microalb, Ur: 2.05 MG/DL
Microalb/Creat Ratio: 10 mg/g (ref 0–30)

## 2020-10-27 ENCOUNTER — Ambulatory Visit: Primary: Adolescent Medicine

## 2020-12-04 ENCOUNTER — Encounter

## 2020-12-05 MED ORDER — OMEPRAZOLE 40 MG CAP, DELAYED RELEASE
40 mg | ORAL_CAPSULE | Freq: Every day | ORAL | 3 refills | Status: DC
Start: 2020-12-05 — End: 2021-10-09

## 2020-12-05 MED ORDER — QUINAPRIL 40 MG TAB
40 mg | ORAL_TABLET | Freq: Every evening | ORAL | 3 refills | Status: DC
Start: 2020-12-05 — End: 2021-12-24

## 2021-01-18 ENCOUNTER — Encounter: Attending: Adolescent Medicine | Primary: Adolescent Medicine

## 2021-02-05 ENCOUNTER — Ambulatory Visit: Attending: Adolescent Medicine | Primary: Adolescent Medicine

## 2021-02-05 ENCOUNTER — Ambulatory Visit
Admit: 2021-02-05 | Discharge: 2021-02-05 | Payer: MEDICARE | Attending: Adolescent Medicine | Primary: Adolescent Medicine

## 2021-02-05 DIAGNOSIS — E1149 Type 2 diabetes mellitus with other diabetic neurological complication: Secondary | ICD-10-CM

## 2021-02-05 NOTE — Progress Notes (Signed)
SPORTS MEDICINE AND PRIMARY CARE  [name]. Ahron Hulbert,MD  [address] [place] [address]    Chief Complaint   Patient presents with   ??? Elevated Blood Pressure     follow up   ??? Diabetes     follow up   ??? Cholesterol Problem     follow up   ??? Labs     pt would like to be checked for anemia       SUBJECTIVE:    Jamie Bryant is a 80 y.o. female who presents for follow up of diabetes, hypertension, hyperlipidemia and history of prior stroke.    Diet and Lifestyle: generally follows a low fat low cholesterol diet, generally follows a low sodium diet, follows a diabetic diet regularly, sedentary, nonsmoker  Home BP Monitoring: is measured at home    Cardiovascular ROS: taking medications as instructed, ?amlodipine use, no medication side effects noted, no TIA's, no chest pain on exertion, no dyspnea on exertion, no swelling of ankles, no palpitations, no muscle aches or pain.       Anemia follow up  Anemia is chronic  Wants to check level  On iron but scaredof overload  Hg decline in chart  Last iron 36  Col over 10 yr, no polyps  No melena or brbpr    T2DM  sees endocrine tomorrow    HLD  Statin intolerance due to myalgia    Care gaps  Eye exam- not td  Shingles vaccine -  Had a reaction  Pneumonia vacc-utd  tdap- ?  covid vacc-utd x 3    o-rt carotid bruit  Current Outpatient Medications   Medication Sig Dispense Refill   ??? quinapriL (ACCUPRIL) 40 mg tablet Take 1 Tablet by mouth nightly. Indication: high blood pressure 90 Tablet 3   ??? omeprazole (PRILOSEC) 40 mg capsule Take 1 Capsule by mouth daily. 90 Capsule 3   ??? estradioL (ESTRACE) 1 mg tablet TAKE 1 TABLET BY MOUTH DAILY 90 Tablet 3   ??? SITagliptin (Januvia) 100 mg tablet Take 1 Tab by mouth daily. Indication: diabetes 30 Tab 0   ??? pregabalin (LYRICA) 150 mg capsule Take 150 mg by mouth three (3) times daily.     ??? torsemide (DEMADEX) 20 mg tablet Take  by mouth daily.     ??? linaclotide (LINZESS) 290 mcg cap capsule Take  by mouth Daily (before breakfast).      ??? cholecalciferol, VITAMIN D3, (VITAMIN D3) 5,000 unit tab tablet Take  by mouth daily.     ??? co-enzyme Q-10 (COQ-10) 100 mg capsule Take 100 mg by mouth daily.     ??? aspirin 81 mg chewable tablet Take 81 mg by mouth daily.     ??? MV,CAL,MIN/IRON/FOLIC ACID/LUT (COMPLETE MULTI PO) Take  by mouth.     ??? beclomethasone (QVAR) 40 mcg/actuation aero Take 1 Puff by inhalation two (2) times a day.     ??? TRIAMCINOLONE ACETONIDE (NASACORT NA) by Nasal route.     ??? olopatadine (PATANOL) 0.1 % ophthalmic solution Administer 2 Drops to both eyes two (2) times a day.     ??? DOCUSATE CALCIUM (STOOL SOFTENER PO) Take  by mouth three (3) times daily as needed.       Past Medical History:   Diagnosis Date   ??? Asthma    ??? Diabetes (HCC)    ??? Fibromyalgia    ??? Hyperlipemia    ??? Hypertension    ??? Stroke (HCC)  Past Surgical History:   Procedure Laterality Date   ??? HX COLONOSCOPY     ??? HX HYSTERECTOMY       Allergies   Allergen Reactions   ??? Ampicillin Rash   ??? Shingrix (Pf) [Varicella-Zoster Ge-As01b (Pf)] Unknown (comments)   ??? Statins-Hmg-Coa Reductase Inhibitors Myalgia     Lovastatin, pravastatin, simvastatin, atorvastatin, zetia, livalo, lovaza       Social History     Socioeconomic History   ??? Marital status: DIVORCED   Tobacco Use   ??? Smoking status: Never Smoker   ??? Smokeless tobacco: Never Used   Substance and Sexual Activity   ??? Alcohol use: No   ??? Drug use: No     Family History   Problem Relation Age of Onset   ??? Breast Cancer Maternal Grandmother        OBJECTIVE:     Visit Vitals  BP (!) 142/73 Comment: right arm   Pulse (!) 58   Temp 97.4 ??F (36.3 ??C) (Oral)   Ht 5\' 4"  (1.626 m)   Wt 218 lb 6.4 oz (99.1 kg)   SpO2 96%   BMI 37.49 kg/m??     Appearance: alert, well appearing, and in no distress.  General exam: CVS exam BP noted to be well controlled today in office, S1, S2 normal, no gallop, no murmur, chest clear, no JVD, no HSM, no edema,  peripheral vascular exam both carotids normal upstroke, +rt carotid bruit,  radial pulses normal, pedal pulses normal both DP's and PT's, neurological exam alert, oriented, normal speech, no focal findings or movement disorder noted.      ASSESSMENT:   1. Type 2 diabetes mellitus with other neurologic complication, without long-term current use of insulin (HCC)    2. Iron deficiency anemia, unspecified iron deficiency anemia type    3. Encounter for hepatitis C screening test for low risk patient    4. Essential hypertension, benign    5. Hyperlipidemia, unspecified hyperlipidemia type    6. Encounter for long-term (current) use of other medications    7. Severe obesity (BMI 35.0-39.9) with comorbidity (HCC)    8. Myalgia due to statin      T2DM, a1c under 7%. followed by endocrine. Continue current mgt.  HTN controlled.  Continue current management.  Iron deficiency anemia, stable. Need colonoscopy date. UTD by patient history.  Hyperlipidemia history.  Myalgia due to statin.  Check level.  Stroke history.  Right carotid bruit. Under 20% stenosis by 2020 CTA neck.      PLAN:  .  Orders Placed This Encounter   ??? HEMOGLOBIN A1C WITH EAG   ??? IRON   ??? FERRITIN   ??? CBC WITH AUTOMATED DIFF   ??? METABOLIC PANEL, BASIC   ??? HEPATITIS C AB   ??? OCCULT BLOOD IMMUNOASSAY,DIAGNOSTIC       I have discussed the diagnosis with the patient and the intended plan as seen in the  orders above.  The patient understands and agrees with the plan.  The patient has   received an after visit summary. Questions were answered concerning  future plans  Patient labs and/or xrays were reviewed as available.  Past records were reviewed as available.    Counseled regarding diet, exercise and healthy lifestyle          Advised patient to  call back or return to office if symptoms develop/worsen/change/persist.  Discussed expected course/resolution/complications of diagnosis in detail with patient.     Medication risks/benefits/interactions discussed  with patient    Signed,  Irving Copas M.D.      This note was created  using voice recognition software.  Edits have been made but syntax errors might exist.

## 2021-02-05 NOTE — Progress Notes (Signed)
 Chief Complaint   Patient presents with   . Elevated Blood Pressure     follow up   . Diabetes     follow up   . Cholesterol Problem     follow up   . Labs     pt would like to be checked for anemia     1. Have you been to the ER, urgent care clinic since your last visit?  Hospitalized since your last visit?No    2. Have you seen or consulted any other health care providers outside of the [facility] since your last visit?  Include any pap smears or colon screening. No

## 2021-02-06 LAB — METABOLIC PANEL, BASIC
Anion gap: 2 mmol/L — ABNORMAL LOW (ref 5–15)
BUN/Creatinine ratio: 16 (ref 12–20)
BUN: 11 MG/DL (ref 6–20)
CO2: 31 mmol/L (ref 21–32)
Calcium: 9.3 MG/DL (ref 8.5–10.1)
Chloride: 106 mmol/L (ref 97–108)
Creatinine: 0.69 MG/DL (ref 0.55–1.02)
GFR est AA: >60 mL/min/{1.73_m2} (ref >60)
GFR est non-AA: >60 mL/min/{1.73_m2} (ref >60)
Glucose: 120 mg/dL — ABNORMAL HIGH (ref 65–100)
Potassium: 5 mmol/L (ref 3.5–5.1)
Sodium: 139 mmol/L (ref 136–145)

## 2021-02-06 LAB — CBC WITH AUTOMATED DIFF
ABS. BASOPHILS: 0.1 10*3/uL (ref 0.0–0.1)
ABS. EOSINOPHILS: 0.2 10*3/uL (ref 0.0–0.4)
ABS. IMM. GRANS.: 0 10*3/uL (ref 0.00–0.04)
ABS. LYMPHOCYTES: 2.6 10*3/uL (ref 0.8–3.5)
ABS. MONOCYTES: 0.4 10*3/uL (ref 0.0–1.0)
ABS. NEUTROPHILS: 2.7 10*3/uL (ref 1.8–8.0)
ABSOLUTE NRBC: 0 10*3/uL (ref 0.00–0.01)
BASOPHILS: 1 % (ref 0–1)
EOSINOPHILS: 3 % (ref 0–7)
HCT: 38.8 % (ref 35.0–47.0)
HGB: 11.9 g/dL (ref 11.5–16.0)
IMMATURE GRANULOCYTES: 0 % (ref 0.0–0.5)
LYMPHOCYTES: 43 % (ref 12–49)
MCH: 27 PG (ref 26.0–34.0)
MCHC: 30.7 g/dL (ref 30.0–36.5)
MCV: 88.2 FL (ref 80.0–99.0)
MONOCYTES: 6 % (ref 5–13)
MPV: 12.2 FL (ref 8.9–12.9)
NEUTROPHILS: 47 % (ref 32–75)
NRBC: 0 PER 100 WBC
PLATELET: 289 10*3/uL (ref 150–400)
RBC: 4.4 M/uL (ref 3.80–5.20)
RDW: 15.6 % — ABNORMAL HIGH (ref 11.5–14.5)
WBC: 5.9 10*3/uL (ref 3.6–11.0)

## 2021-02-06 LAB — FERRITIN
Ferritin: 37 NG/ML (ref 8–252)
Ferritin: 37 NG/ML (ref 8–252)

## 2021-02-06 LAB — HEPATITIS C AB: Hep C virus Ab Interp.: NONREACTIVE

## 2021-02-06 LAB — HEMOGLOBIN A1C WITH EAG
Est. average glucose: 148 mg/dL
Hemoglobin A1c: 6.8 % — ABNORMAL HIGH (ref 4.0–5.6)

## 2021-02-06 LAB — IRON
Iron: 56 ug/dL (ref 35–150)
Iron: 56 ug/dL (ref 35–150)

## 2021-02-06 LAB — HEMOGLOBIN A1C W/EAG
Estimated Avg Glucose: 148 mg/dL
Hemoglobin A1C: 6.8 % — ABNORMAL HIGH (ref 4.0–5.6)

## 2021-02-06 LAB — CBC WITH AUTO DIFFERENTIAL
Basophils %: 1 % (ref 0–1)
Basophils Absolute: 0.1 10*3/uL (ref 0.0–0.1)
Eosinophils %: 3 % (ref 0–7)
Eosinophils Absolute: 0.2 10*3/uL (ref 0.0–0.4)
Granulocyte Absolute Count: 0 10*3/uL (ref 0.00–0.04)
Hematocrit: 38.8 % (ref 35.0–47.0)
Hemoglobin: 11.9 g/dL (ref 11.5–16.0)
Immature Granulocytes %: 0 % (ref 0.0–0.5)
Lymphocytes %: 43 % (ref 12–49)
Lymphocytes Absolute: 2.6 10*3/uL (ref 0.8–3.5)
MCH: 27 PG (ref 26.0–34.0)
MCHC: 30.7 g/dL (ref 30.0–36.5)
MCV: 88.2 FL (ref 80.0–99.0)
MPV: 12.2 FL (ref 8.9–12.9)
Monocytes %: 6 % (ref 5–13)
Monocytes Absolute: 0.4 10*3/uL (ref 0.0–1.0)
NRBC Absolute: 0 10*3/uL (ref 0.00–0.01)
Neutrophils %: 47 % (ref 32–75)
Neutrophils Absolute: 2.7 10*3/uL (ref 1.8–8.0)
Nucleated RBCs: 0 PER 100 WBC
Platelets: 289 10*3/uL (ref 150–400)
RBC: 4.4 M/uL (ref 3.80–5.20)
RDW: 15.6 % — ABNORMAL HIGH (ref 11.5–14.5)
WBC: 5.9 10*3/uL (ref 3.6–11.0)

## 2021-02-06 LAB — BASIC METABOLIC PANEL
Anion Gap: 2 mmol/L — ABNORMAL LOW (ref 5–15)
BUN/Creatinine Ratio: 16 (ref 12–20)
BUN: 11 MG/DL (ref 6–20)
CO2: 31 mmol/L (ref 21–32)
Calcium: 9.3 MG/DL (ref 8.5–10.1)
Chloride: 106 mmol/L (ref 97–108)
Creatinine: 0.69 MG/DL (ref 0.55–1.02)
GFR African American: >60 mL/min/{1.73_m2} (ref >60)
Glucose: 120 mg/dL — ABNORMAL HIGH (ref 65–100)
Potassium: 5 mmol/L (ref 3.5–5.1)
Sodium: 139 mmol/L (ref 136–145)
eGFR NON-AA: >60 mL/min/{1.73_m2} (ref >60)

## 2021-02-06 LAB — HEPATITIS C ANTIBODY: Hepatitis C Ab: NONREACTIVE

## 2021-03-07 LAB — OCCULT BLOOD IMMUNOASSAY,DIAGNOSTIC: Occult blood fecal, by IA: NEGATIVE

## 2021-03-07 LAB — OCCULT BLOOD DIAGNOSTIC: OCCULT BLOOD, FECAL, IA: NEGATIVE

## 2021-03-21 ENCOUNTER — Encounter: Attending: Adolescent Medicine | Primary: Adolescent Medicine

## 2021-04-11 NOTE — Telephone Encounter (Signed)
 Received call from Patrice at [facility] with Red Flag Complaint.    Subjective: Caller states I have a sore throat that I've had for about 7 days. The solution makes me sleep with my mouth open and my mouth is dry.     Apartment has put in a system in November that can be turned on and off- caller states this is causing her all kinds of issues.    Current Symptoms: Sore throat, face/eyes burn     Onset: x7 days     Associated Symptoms: NA    Pain Severity: 20/10; constant- sore throat, hurts all over     Temperature: Denies fever and feeling feverish/chills     What has been tried: Salt water  gargle, tylenol       LMP: NA Pregnant: NA     Recommended disposition: See PCP within 24 Hours     Care advice provided, patient verbalizes understanding; denies any other questions or concerns; instructed to call back for any new or worsening symptoms.    Patient/Caller agrees with recommended disposition; Clinical research associate provided warm transfer to Rembert at [facility] [place] for appointment scheduling.    Attention Provider:  Thank you for allowing me to participate in the care of your patient.  The patient was connected to triage in response to information provided to the [facility].  Please do not respond through this encounter as the response is not directed to a shared pool.      Reason for Disposition  . Earache also present    Protocols used: SORE THROAT-ADULT-AH

## 2021-04-22 ENCOUNTER — Ambulatory Visit: Attending: Adolescent Medicine | Primary: Adolescent Medicine

## 2021-04-22 ENCOUNTER — Encounter: Admit: 2021-04-22 | Payer: MEDICARE | Primary: Adolescent Medicine

## 2021-04-22 ENCOUNTER — Ambulatory Visit
Admit: 2021-04-22 | Discharge: 2021-04-22 | Payer: MEDICARE | Attending: Adolescent Medicine | Primary: Adolescent Medicine

## 2021-04-22 DIAGNOSIS — R0600 Dyspnea, unspecified: Secondary | ICD-10-CM

## 2021-04-22 NOTE — Progress Notes (Signed)
 Rm    No chief complaint on file.       Visit Vitals  BP 132/84 (BP 1 Location: Left upper arm, BP Patient Position: Sitting, BP Cuff Size: Adult)   Pulse 71   Temp 99.2 F (37.3 C) (Oral)   Resp 18   Ht 5' 4 (1.626 m)   Wt 221 lb 4.8 oz (100.4 kg)   SpO2 97%   BMI 37.99 kg/m        1. Have you been to the ER, urgent care clinic since your last visit?  Hospitalized since your last visit?No    2. Have you seen or consulted any other health care providers outside of the [facility] since your last visit?  Include any pap smears or colon screening. No     Health Maintenance Due   Topic Date Due   . Pneumococcal 65+ years (1 - PCV) Never done   . Eye Exam Retinal or Dilated  Never done   . Shingrix Vaccine Age 29> (1 of 2) Never done   . Bone Densitometry (Dexa) Screening  Never done   . DTaP/Tdap/Td series (1 - Tdap) 10/21/2015   . COVID-19 Vaccine (3 - Booster for Pfizer series) 07/13/2020        3 most recent PHQ Screens 04/22/2021   Little interest or pleasure in doing things Several days   Feeling down, depressed, irritable, or hopeless Several days   Total Score PHQ 2 2   Trouble falling or staying asleep, or sleeping too much -   Feeling tired or having little energy -   Poor appetite, weight loss, or overeating -   Feeling bad about yourself - or that you are a failure or have let yourself or your family down -   Trouble concentrating on things such as school, work, reading, or watching TV -   Moving or speaking so slowly that other people could have noticed; or the opposite being so fidgety that others notice -   Thoughts of being better off dead, or hurting yourself in some way -   PHQ 9 Score -   How difficult have these problems made it for you to do your work, take care of your home and get along with others -        Fall Risk Assessment, last 12 mths 04/22/2021   Able to walk? Yes   Fall in past 12 months? 1   Do you feel unsteady? 1   Are you worried about falling 1   Is TUG test greater  than 12 seconds? (No Data)   Is the gait abnormal? 1   Number of falls in past 12 months 2   Fall with injury? 0       Learning Assessment 11/04/2019   PRIMARY LEARNER Patient   PRIMARY LANGUAGE ENGLISH   LEARNER PREFERENCE PRIMARY LISTENING   ANSWERED BY selfs   RELATIONSHIP SELF

## 2021-04-22 NOTE — Progress Notes (Signed)
SPORTS MEDICINE AND PRIMARY CARE  [name]. Ainslee Sou,MD  [address] [name] [address]    No chief complaint on file.      SUBJECTIVE:    Jamie Bryant is a 80 y.o. female for sore throat evaluation.    Intermittent sore throat x November.Patient is convinced that her neighbor's fumes leak into her apartment caused her to feel ill including having a sore throat.  This is an old delusion possibly connected to Parkinson's disease; however, patient states that her neurologist concluded that she did not have Parkinson's disease.  Patient denies trouble swallowing fever chills respiratory symptoms  for itchy throat.    Occasional shortness of breath when walking x several weeks. Patient denies chest pain, palpitations, PND or leg swelling. +weight gain.     Hypertension treatment is stable.  Compliant with medications and diet.  Does not get much exercise.    Diabetic ROS: no hypoglycemia,  no polyuria, no polydipsia, no blurred vision, + paresthesias, no weight changes or pruritis, no infections or skin issues;  feet and nails are intact    Current Outpatient Medications   Medication Sig Dispense Refill   ??? quinapriL (ACCUPRIL) 40 mg tablet Take 1 Tablet by mouth nightly. Indication: high blood pressure 90 Tablet 3   ??? omeprazole (PRILOSEC) 40 mg capsule Take 1 Capsule by mouth daily. 90 Capsule 3   ??? estradioL (ESTRACE) 1 mg tablet TAKE 1 TABLET BY MOUTH DAILY 90 Tablet 3   ??? SITagliptin (Januvia) 100 mg tablet Take 1 Tab by mouth daily. Indication: diabetes 30 Tab 0   ??? pregabalin (LYRICA) 150 mg capsule Take 150 mg by mouth three (3) times daily.     ??? torsemide (DEMADEX) 20 mg tablet Take  by mouth daily.     ??? linaclotide (LINZESS) 290 mcg cap capsule Take  by mouth Daily (before breakfast).     ??? cholecalciferol, VITAMIN D3, (VITAMIN D3) 5,000 unit tab tablet Take  by mouth daily.     ??? co-enzyme Q-10 (COQ-10) 100 mg capsule Take 100 mg by mouth daily.     ??? aspirin 81 mg chewable tablet Take 81 mg by mouth  daily.     ??? MV,CAL,MIN/IRON/FOLIC ACID/LUT (COMPLETE MULTI PO) Take  by mouth.     ??? beclomethasone (QVAR) 40 mcg/actuation aero Take 1 Puff by inhalation two (2) times a day.     ??? TRIAMCINOLONE ACETONIDE (NASACORT NA) by Nasal route.     ??? olopatadine (PATANOL) 0.1 % ophthalmic solution Administer 2 Drops to both eyes two (2) times a day.     ??? DOCUSATE CALCIUM (STOOL SOFTENER PO) Take  by mouth three (3) times daily as needed.       Past Medical History:   Diagnosis Date   ??? Asthma    ??? Diabetes (HCC)    ??? Fibromyalgia    ??? Hyperlipemia    ??? Hypertension    ??? Psychotic disorder (HCC)    ??? Stroke [facility])      Past Surgical History:   Procedure Laterality Date   ??? HX COLONOSCOPY     ??? HX HYSTERECTOMY       Allergies   Allergen Reactions   ??? Ampicillin Rash   ??? Shingrix (Pf) [Varicella-Zoster Ge-As01b (Pf)] Unknown (comments)   ??? Statins-Hmg-Coa Reductase Inhibitors Myalgia     Lovastatin, pravastatin, simvastatin, atorvastatin, zetia, livalo, lovaza       REVIEW OF SYSTEMS:  General: negative for -  chills or fever  ENT: negative for - headaches, nasal congestion, tinnitus, hearing loss, vision changes, sore throat  Respiratory: negative for - cough, hemoptysis, shortness of breath or wheezing  Cardiovascular : +doe; negative for - chest pain, edema, palpitations   Gastrointestinal: negative for - abdominal pain, blood in stools, heartburn or nausea/vomiting, diarrhea, constipation  Genito-Urinary: no dysuria, trouble voiding, hematuria or erectile dysfunction  Musculoskeletal: negative for - gait disturbance, joint pain, joint stiffness , joint swelling, muscle aches  Neurological: no TIA or stroke symptoms  Hematologic: no bruises, no bleeding, no swollen glands  Integument: no lumps, mole changes, nail changes or rash  Endocrine:+unexpected weight changes/gain; no malaise/lethargy or       Social History     Socioeconomic History   ??? Marital status: DIVORCED   Tobacco Use   ??? Smoking status: Never Smoker   ???  Smokeless tobacco: Never Used   Substance and Sexual Activity   ??? Alcohol use: No   ??? Drug use: No     Family History   Problem Relation Age of Onset   ??? Breast Cancer Maternal Grandmother        OBJECTIVE:     Visit Vitals  BP 132/84 (BP 1 Location: Left upper arm, BP Patient Position: Sitting, BP Cuff Size: Adult)   Pulse 71   Temp 99.2 ??F (37.3 ??C) (Oral)   Resp 18   Ht 5\' 4"  (1.626 m)   Wt 221 lb 4.8 oz (100.4 kg)   SpO2 97%   BMI 37.99 kg/m??     Appearance: alert, well appearing, and in no distress.  General exam: CVS exam BP noted to be well controlled today in office, S1, S2 normal, no gallop, no murmur, chest clear, no JVD, no HSM, no edema,  peripheral vascular exam both carotids normal upstroke without bruits, radial pulses normal, pedal pulses normal both DP's and PT's, neurological exam alert, oriented, normal speech, no focal findings or movement disorder noted.    ASSESSMENT/PLAN:  1. Dyspnea, unspecified type rule out cardiopulmonary causes, anemia, psychiatric etiology   2. Pharyngitis, unspecified etiology  Rule out viral/bacterial infection, allergy, acid reflux.   3. Delusion (HCC)    4. Type 2 diabetes mellitus with other neurologic complication, with long-term current use of insulin (HCC) - well controlled. Check a1c   5. Parkinsonism, unspecified Parkinsonism type (HCC) - possible dx, +symptoms/signs, followed by neurology   6. Hyperlipidemia, unspecified hyperlipidemia type - stable statin use   7. Encounter for hepatitis C screening test for low risk patient    8. Encounter for long-term (current) use of other medications      .  Orders Placed This Encounter   ??? CULTURE, THROAT ([facility] Default) (For [facility] use )   ??? XR CHEST PA LAT   ??? HEPATITIS C AB   RTO 3 mo or sooner prn    I have discussed the diagnosis with the patient and the intended plan as seen in the  orders above.  The patient understands and agrees with the plan.  The patient has   received an after visit summary.  Questions were answered concerning  future plans  Patient labs and/or xrays were reviewed as available.  Past records were reviewed as available.    Counseled regarding diet, exercise and healthy lifestyle          Advised patient to proceed to urgent care, call back or return to office if symptoms develop/worsen/change/persist.  Discussed expected course/resolution/complications of diagnosis in  detail with patient.     Medication risks/benefits/costs/interactions/alternatives reviewed    Signed,  Irving Copas M.D.      This note was created using voice recognition software.  Edits have been made but syntax errors might exist.

## 2021-04-23 LAB — URINALYSIS W/ RFLX MICROSCOPIC
Bilirubin, Urine: NEGATIVE
Bilirubin: NEGATIVE
Blood, Urine: NEGATIVE
Blood: NEGATIVE
Glucose, Ur: NEGATIVE mg/dL
Glucose: NEGATIVE mg/dL
Ketone: NEGATIVE mg/dL
Ketones, Urine: NEGATIVE mg/dL
Leukocyte Esterase, Urine: NEGATIVE
Leukocyte Esterase: NEGATIVE
Nitrite, Urine: NEGATIVE
Nitrites: NEGATIVE
Protein, UA: NEGATIVE mg/dL
Protein: NEGATIVE mg/dL
Specific Gravity, UA: 1.028 (ref 1.003–1.030)
Specific gravity: 1.028 (ref 1.003–1.030)
Urobilinogen, UA, POCT: 0.2 EU/dL (ref 0.2–1.0)
Urobilinogen: 0.2 EU/dL (ref 0.2–1.0)
pH (UA): 5 (ref 5.0–8.0)
pH, UA: 5 (ref 5.0–8.0)

## 2021-04-23 LAB — METABOLIC PANEL, COMPREHENSIVE
A-G Ratio: 1.5 (ref 1.1–2.2)
ALT (SGPT): 26 U/L (ref 12–78)
AST (SGOT): 18 U/L (ref 15–37)
Albumin: 3.9 g/dL (ref 3.5–5.0)
Alk. phosphatase: 77 U/L (ref 45–117)
Anion gap: 3 mmol/L — ABNORMAL LOW (ref 5–15)
BUN/Creatinine ratio: 18 (ref 12–20)
BUN: 13 MG/DL (ref 6–20)
Bilirubin, total: 0.2 MG/DL (ref 0.2–1.0)
CO2: 29 mmol/L (ref 21–32)
Calcium: 9.3 MG/DL (ref 8.5–10.1)
Chloride: 108 mmol/L (ref 97–108)
Creatinine: 0.73 MG/DL (ref 0.55–1.02)
GFR est AA: >60 mL/min/{1.73_m2} (ref >60)
GFR est non-AA: >60 mL/min/{1.73_m2} (ref >60)
Globulin: 2.6 g/dL (ref 2.0–4.0)
Glucose: 124 mg/dL — ABNORMAL HIGH (ref 65–100)
Potassium: 4.7 mmol/L (ref 3.5–5.1)
Protein, total: 6.5 g/dL (ref 6.4–8.2)
Sodium: 140 mmol/L (ref 136–145)

## 2021-04-23 LAB — LIPID PANEL
CHOL/HDL Ratio: 2.1 (ref 0.0–5.0)
Chol/HDL Ratio: 2.1 (ref 0.0–5.0)
Cholesterol, Total: 146 MG/DL (ref <200)
Cholesterol, total: 146 MG/DL (ref <200)
HDL Cholesterol: 68 MG/DL
HDL: 68 MG/DL
LDL Calculated: 55.8 MG/DL (ref 0–100)
LDL, calculated: 55.8 MG/DL (ref 0–100)
Triglyceride: 111 MG/DL (ref <150)
Triglycerides: 111 MG/DL (ref <150)
VLDL Cholesterol Calculated: 22.2 MG/DL
VLDL, calculated: 22.2 MG/DL

## 2021-04-23 LAB — MICROALBUMIN, UR, RAND W/ MICROALB/CREAT RATIO
Creatinine, urine random: 111 mg/dL
Microalbumin,urine random: 1.61 MG/DL
Microalbumin/Creat ratio (mg/g creat): 15 mg/g (ref 0–30)

## 2021-04-23 LAB — HEPATITIS C AB: Hep C virus Ab Interp.: NONREACTIVE

## 2021-04-23 LAB — HEMOGLOBIN A1C WITH EAG
Est. average glucose: 146 mg/dL
Hemoglobin A1c: 6.7 % — ABNORMAL HIGH (ref 4.0–5.6)

## 2021-04-23 LAB — COMPREHENSIVE METABOLIC PANEL
ALT: 26 U/L (ref 12–78)
AST: 18 U/L (ref 15–37)
Albumin/Globulin Ratio: 1.5 (ref 1.1–2.2)
Albumin: 3.9 g/dL (ref 3.5–5.0)
Alkaline Phosphatase: 77 U/L (ref 45–117)
Anion Gap: 3 mmol/L — ABNORMAL LOW (ref 5–15)
BUN/Creatinine Ratio: 18 (ref 12–20)
BUN: 13 MG/DL (ref 6–20)
CO2: 29 mmol/L (ref 21–32)
Calcium: 9.3 MG/DL (ref 8.5–10.1)
Chloride: 108 mmol/L (ref 97–108)
Creatinine: 0.73 MG/DL (ref 0.55–1.02)
GFR African American: >60 mL/min/{1.73_m2} (ref >60)
Globulin: 2.6 g/dL (ref 2.0–4.0)
Glucose: 124 mg/dL — ABNORMAL HIGH (ref 65–100)
Potassium: 4.7 mmol/L (ref 3.5–5.1)
Sodium: 140 mmol/L (ref 136–145)
Total Bilirubin: 0.2 MG/DL (ref 0.2–1.0)
Total Protein: 6.5 g/dL (ref 6.4–8.2)
eGFR NON-AA: >60 mL/min/{1.73_m2} (ref >60)

## 2021-04-23 LAB — MICROALBUMIN / CREATININE URINE RATIO
Creatinine, Ur: 111 mg/dL
Microalb, Ur: 1.61 MG/DL
Microalb/Creat Ratio: 15 mg/g (ref 0–30)

## 2021-04-23 LAB — HEMOGLOBIN A1C W/EAG
Estimated Avg Glucose: 146 mg/dL
Hemoglobin A1C: 6.7 % — ABNORMAL HIGH (ref 4.0–5.6)

## 2021-04-23 LAB — HEPATITIS C ANTIBODY: Hepatitis C Ab: NONREACTIVE

## 2021-04-25 LAB — CULTURE, THROAT

## 2021-05-07 ENCOUNTER — Encounter: Attending: Adolescent Medicine | Primary: Adolescent Medicine

## 2021-05-07 ENCOUNTER — Ambulatory Visit: Admit: 2021-05-07 | Discharge: 2021-05-07 | Payer: MEDICARE | Primary: Adolescent Medicine

## 2021-07-05 ENCOUNTER — Ambulatory Visit: Attending: Adolescent Medicine | Primary: Adolescent Medicine

## 2021-07-05 ENCOUNTER — Ambulatory Visit
Admit: 2021-07-05 | Discharge: 2021-07-05 | Payer: MEDICARE | Attending: Adolescent Medicine | Primary: Adolescent Medicine

## 2021-07-05 DIAGNOSIS — Z0000 Encounter for general adult medical examination without abnormal findings: Secondary | ICD-10-CM

## 2021-07-05 NOTE — Progress Notes (Signed)
 Jamie Bryant is a 80 y.o. female    Chief Complaint   Patient presents with   . Diabetes   . Hypertension     1. Have you been to the ER, urgent care clinic since your last visit?  Hospitalized since your last visit? No     2. Have you seen or consulted any other health care providers outside of the [facility] since your last visit?  Include any pap smears or colon screening. No   This is the Subsequent Medicare Annual Wellness Exam, performed 12 months or more after the Initial AWV or the last Subsequent AWV    I have reviewed the patient's medical history in detail and updated the computerized patient record.       Assessment/Plan   Education and counseling provided:  Are appropriate based on today's review and evaluation    1. Medicare annual wellness visit, subsequent  2. Screening for alcoholism       Depression Risk Factor Screening     3 most recent PHQ Screens 07/05/2021   Little interest or pleasure in doing things Not at all   Feeling down, depressed, irritable, or hopeless Not at all   Total Score PHQ 2 0   Trouble falling or staying asleep, or sleeping too much Not at all   Feeling tired or having little energy Not at all   Poor appetite, weight loss, or overeating Not at all   Feeling bad about yourself - or that you are a failure or have let yourself or your family down Not at all   Trouble concentrating on things such as school, work, reading, or watching TV Not at all   Moving or speaking so slowly that other people could have noticed; or the opposite being so fidgety that others notice Not at all   Thoughts of being better off dead, or hurting yourself in some way Not at all   PHQ 9 Score 0   How difficult have these problems made it for you to do your work, take care of your home and get along with others Not difficult at all       Alcohol & Drug Abuse Risk Screen    Do you average more than 1 drink per night or more than 7 drinks a week:  No    On any one occasion in the past three  months have you have had more than 3 drinks containing alcohol:  No          Functional Ability and Level of Safety    Hearing: Hearing is good.      Activities of Daily Living:  The home contains: no safety equipment.  Patient does total self care      Ambulation: with no difficulty     Fall Risk:  Fall Risk Assessment, last 12 mths 07/05/2021   Able to walk? Yes   Fall in past 12 months? 1   Do you feel unsteady? 0   Are you worried about falling 0   Is TUG test greater than 12 seconds? 0   Is the gait abnormal? 0   Number of falls in past 12 months 1   Fall with injury? 0      Abuse Screen:  Patient is not abused       Cognitive Screening    Has your family/caregiver stated any concerns about your memory: no     Cognitive Screening: Normal - Verbal Fluency Test  Health Maintenance Due     Health Maintenance Due   Topic Date Due   . Pneumococcal 65+ years (1 - PCV) Never done   . Eye Exam Retinal or Dilated  Never done   . Shingrix Vaccine Age 34> (1 of 2) Never done   . Bone Densitometry (Dexa) Screening  Never done   . DTaP/Tdap/Td series (1 - Tdap) 10/21/2015   . COVID-19 Vaccine (3 - Booster for Pfizer series) 07/13/2020       Patient Care Team   Patient Care Team:  Blondie Rosella, MD as PCP - General (Family Medicine)  Blondie Rosella, MD as PCP - [facility] Empaneled Provider    History     Patient Active Problem List   Diagnosis Code   . Type 2 diabetes mellitus E11.9   . Myalgia due to statin M79.10, T46.6X5A   . Iron deficiency anemia D50.9   . Essential hypertension, benign I10   . Hyperlipidemia E78.5   . Severe obesity (BMI 35.0-39.9) with comorbidity (HCC) E66.01   . History of arterial ischemic stroke Z86.73   . Bruit of right carotid artery R09.89   . Parkinsonism, unspecified Parkinsonism type (HCC) G20   . Delusion (HCC) F22     Past Medical History:   Diagnosis Date   . Asthma    . Diabetes (HCC)    . Fibromyalgia    . Hyperlipemia    . Hypertension    . Psychotic disorder (HCC)    . Stroke [facility])        Past Surgical History:   Procedure Laterality Date   . HX COLONOSCOPY     . HX HYSTERECTOMY       Current Outpatient Medications   Medication Sig Dispense Refill   . quinapriL  (ACCUPRIL ) 40 mg tablet Take 1 Tablet by mouth nightly. Indication: high blood pressure 90 Tablet 3   . omeprazole  (PRILOSEC) 40 mg capsule Take 1 Capsule by mouth daily. 90 Capsule 3   . estradioL (ESTRACE) 1 mg tablet TAKE 1 TABLET BY MOUTH DAILY 90 Tablet 3   . SITagliptin  (Januvia ) 100 mg tablet Take 1 Tab by mouth daily. Indication: diabetes 30 Tab 0   . pregabalin  (LYRICA ) 150 mg capsule Take 150 mg by mouth three (3) times daily.     . torsemide  (DEMADEX ) 20 mg tablet Take  by mouth daily.     . linaclotide  (LINZESS ) 290 mcg cap capsule Take  by mouth Daily (before breakfast).     . cholecalciferol , VITAMIN D3, (VITAMIN D3) 5,000 unit tab tablet Take  by mouth daily.     SABRA co-enzyme Q-10 (COQ-10) 100 mg capsule Take 100 mg by mouth daily.     . aspirin  81 mg chewable tablet Take 81 mg by mouth daily.     . MV,CAL,MIN/IRON/FOLIC ACID/LUT (COMPLETE MULTI PO) Take  by mouth.     . beclomethasone (QVAR) 40 mcg/actuation aero Take 1 Puff by inhalation two (2) times a day.     . TRIAMCINOLONE ACETONIDE (NASACORT NA) by Nasal route.     SABRA olopatadine (PATANOL) 0.1 % ophthalmic solution Administer 2 Drops to both eyes two (2) times a day.     SABRA DOCUSATE CALCIUM (STOOL SOFTENER PO) Take  by mouth three (3) times daily as needed.       Allergies   Allergen Reactions   . Ampicillin Rash   . Shingrix (Pf) [Varicella-Zoster Ge-As01b (Pf)] Unknown (comments)   . Statins-Hmg-Coa Reductase Inhibitors Myalgia  Lovastatin, pravastatin, simvastatin, atorvastatin, zetia, livalo, lovaza       Family History   Problem Relation Age of Onset   . Breast Cancer Maternal Grandmother      Social History     Tobacco Use   . Smoking status: Never Smoker   . Smokeless tobacco: Never Used   Substance Use Topics   . Alcohol use: No         Romana Gustin, MA
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
## 2021-07-08 DIAGNOSIS — Z0000 Encounter for general adult medical examination without abnormal findings: Secondary | ICD-10-CM

## 2021-07-12 NOTE — Telephone Encounter (Signed)
Patient called stating you were going to call her about the forms for DMV she says she can not drive after today. Her number is [phone]

## 2021-07-25 ENCOUNTER — Ambulatory Visit: Attending: Adolescent Medicine | Primary: Adolescent Medicine

## 2021-10-10 MED ORDER — OMEPRAZOLE 40 MG CAP, DELAYED RELEASE
40 mg | ORAL_CAPSULE | Freq: Every day | ORAL | 1 refills | Status: AC
Start: 2021-10-10 — End: ?

## 2021-10-17 MED ORDER — ESTRADIOL 1 MG TAB
1 mg | ORAL_TABLET | Freq: Every day | ORAL | 1 refills | Status: DC
Start: 2021-10-17 — End: 2021-10-25

## 2021-10-26 MED ORDER — ESTRADIOL 1 MG TAB
1 mg | ORAL_TABLET | Freq: Every day | ORAL | 1 refills | Status: DC
Start: 2021-10-26 — End: 2022-04-08

## 2021-10-31 NOTE — Telephone Encounter (Signed)
Patient called stating that she received a call from someone here at the office concerning that provider needed to know why she needed the *estradiol rx*? States that she is having really bad hot flashes.   Also pt is asking if provider can please send her a 7 day supply of the estradiol rx to the local [facility] pharmacy. She is willing to pay out of pocket for the rx. Thanks!

## 2021-11-01 NOTE — Telephone Encounter (Signed)
Patient called back today check on status. Would like before the weekend and she rely on transportation to pick up.

## 2021-12-24 ENCOUNTER — Encounter

## 2021-12-25 MED ORDER — QUINAPRIL 40 MG TAB
40 mg | ORAL_TABLET | Freq: Every evening | ORAL | 3 refills | Status: DC
Start: 2021-12-25 — End: 2022-03-08

## 2022-02-28 ENCOUNTER — Emergency Department: Admit: 2022-02-28 | Payer: MEDICARE

## 2022-02-28 ENCOUNTER — Inpatient Hospital Stay
Admit: 2022-02-28 | Discharge: 2022-03-08 | Disposition: A | Discharge status: Home / Self Care | Payer: MEDICARE | Attending: Acute Care | Admitting: Acute Care

## 2022-02-28 DIAGNOSIS — L03213 Periorbital cellulitis: Secondary | ICD-10-CM

## 2022-02-28 LAB — CBC WITH AUTOMATED DIFF
ABS. BASOPHILS: 0.1 10*3/uL (ref 0.0–0.1)
ABS. EOSINOPHILS: 0.4 10*3/uL (ref 0.0–0.4)
ABS. IMM. GRANS.: 0 10*3/uL (ref 0.00–0.04)
ABS. LYMPHOCYTES: 1.8 10*3/uL (ref 0.8–3.5)
ABS. MONOCYTES: 0.5 10*3/uL (ref 0.0–1.0)
ABS. NEUTROPHILS: 5.2 10*3/uL (ref 1.8–8.0)
ABSOLUTE NRBC: 0 10*3/uL (ref 0.00–0.01)
BASOPHILS: 1 % (ref 0–1)
EOSINOPHILS: 5 % (ref 0–7)
HCT: 38.8 % (ref 35.0–47.0)
HGB: 11.8 g/dL (ref 11.5–16.0)
IMMATURE GRANULOCYTES: 0 % (ref 0.0–0.5)
LYMPHOCYTES: 23 % (ref 12–49)
MCH: 27.9 PG (ref 26.0–34.0)
MCHC: 30.4 g/dL (ref 30.0–36.5)
MCV: 91.7 FL (ref 80.0–99.0)
MONOCYTES: 7 % (ref 5–13)
MPV: 11.4 FL (ref 8.9–12.9)
NEUTROPHILS: 64 % (ref 32–75)
NRBC: 0 PER 100 WBC
PLATELET: 389 10*3/uL (ref 150–400)
RBC: 4.23 M/uL (ref 3.80–5.20)
RDW: 14.6 % — ABNORMAL HIGH (ref 11.5–14.5)
WBC: 8.1 10*3/uL (ref 3.6–11.0)

## 2022-02-28 LAB — METABOLIC PANEL, COMPREHENSIVE
A-G Ratio: 1.1 (ref 1.1–2.2)
ALT (SGPT): 26 U/L (ref 12–78)
AST (SGOT): 12 U/L — ABNORMAL LOW (ref 15–37)
Albumin: 4.1 g/dL (ref 3.5–5.0)
Alk. phosphatase: 81 U/L (ref 45–117)
Anion gap: 4 mmol/L — ABNORMAL LOW (ref 5–15)
BUN/Creatinine ratio: 17 (ref 12–20)
BUN: 13 MG/DL (ref 6–20)
Bilirubin, total: 0.2 MG/DL (ref 0.2–1.0)
CO2: 31 mmol/L (ref 21–32)
Calcium: 9.5 MG/DL (ref 8.5–10.1)
Chloride: 104 mmol/L (ref 97–108)
Creatinine: 0.75 MG/DL (ref 0.55–1.02)
Globulin: 3.7 g/dL (ref 2.0–4.0)
Glucose: 143 mg/dL — ABNORMAL HIGH (ref 65–100)
Potassium: 4.1 mmol/L (ref 3.5–5.1)
Protein, total: 7.8 g/dL (ref 6.4–8.2)
Sodium: 139 mmol/L (ref 136–145)
eGFR: >60 mL/min/{1.73_m2} (ref >60)

## 2022-02-28 LAB — SAMPLES BEING HELD

## 2022-02-28 LAB — CBC WITH AUTO DIFFERENTIAL
Basophils %: 1 % (ref 0–1)
Basophils Absolute: 0.1 10*3/uL (ref 0.0–0.1)
Eosinophils %: 5 % (ref 0–7)
Eosinophils Absolute: 0.4 10*3/uL (ref 0.0–0.4)
Granulocyte Absolute Count: 0 10*3/uL (ref 0.00–0.04)
Hematocrit: 38.8 % (ref 35.0–47.0)
Hemoglobin: 11.8 g/dL (ref 11.5–16.0)
Immature Granulocytes %: 0 % (ref 0.0–0.5)
Lymphocytes %: 23 % (ref 12–49)
Lymphocytes Absolute: 1.8 10*3/uL (ref 0.8–3.5)
MCH: 27.9 PG (ref 26.0–34.0)
MCHC: 30.4 g/dL (ref 30.0–36.5)
MCV: 91.7 FL (ref 80.0–99.0)
MPV: 11.4 FL (ref 8.9–12.9)
Monocytes %: 7 % (ref 5–13)
Monocytes Absolute: 0.5 10*3/uL (ref 0.0–1.0)
NRBC Absolute: 0 10*3/uL (ref 0.00–0.01)
Neutrophils %: 64 % (ref 32–75)
Neutrophils Absolute: 5.2 10*3/uL (ref 1.8–8.0)
Nucleated RBCs: 0 PER 100 WBC
Platelets: 389 10*3/uL (ref 150–400)
RBC: 4.23 M/uL (ref 3.80–5.20)
RDW: 14.6 % — ABNORMAL HIGH (ref 11.5–14.5)
WBC: 8.1 10*3/uL (ref 3.6–11.0)

## 2022-02-28 LAB — COMPREHENSIVE METABOLIC PANEL
ALT: 26 U/L (ref 12–78)
AST: 12 U/L — ABNORMAL LOW (ref 15–37)
Albumin/Globulin Ratio: 1.1 (ref 1.1–2.2)
Albumin: 4.1 g/dL (ref 3.5–5.0)
Alkaline Phosphatase: 81 U/L (ref 45–117)
Anion Gap: 4 mmol/L — ABNORMAL LOW (ref 5–15)
BUN/Creatinine Ratio: 17 (ref 12–20)
BUN: 13 MG/DL (ref 6–20)
CO2: 31 mmol/L (ref 21–32)
Calcium: 9.5 MG/DL (ref 8.5–10.1)
Chloride: 104 mmol/L (ref 97–108)
Creatinine: 0.75 MG/DL (ref 0.55–1.02)
Est, Glom Filt Rate: >60 mL/min/{1.73_m2} (ref >60)
Globulin: 3.7 g/dL (ref 2.0–4.0)
Glucose: 143 mg/dL — ABNORMAL HIGH (ref 65–100)
Potassium: 4.1 mmol/L (ref 3.5–5.1)
Sodium: 139 mmol/L (ref 136–145)
Total Bilirubin: 0.2 MG/DL (ref 0.2–1.0)
Total Protein: 7.8 g/dL (ref 6.4–8.2)

## 2022-02-28 MED ORDER — IOPAMIDOL 61 % IV SOLN
300 mg iodine /mL (61 %) | Freq: Once | INTRAVENOUS | Status: AC
Start: 2022-02-28 — End: 2022-02-28
  Administered 2022-02-28: 22:00:00 via INTRAVENOUS

## 2022-02-28 MED ORDER — DEXAMETHASONE SODIUM PHOSPHATE (PF) 10 MG/ML INJECTION
10 mg/mL | INTRAMUSCULAR | Status: AC
Start: 2022-02-28 — End: 2022-02-28
  Administered 2022-02-28: 21:00:00 via INTRAVENOUS

## 2022-02-28 MED ORDER — VANCOMYCIN IN 0.9 % SODIUM CHLORIDE 2.5 GRAM/500 ML IV
2.5 gram/500 mL | INTRAVENOUS | Status: AC
Start: 2022-02-28 — End: 2022-02-28
  Administered 2022-03-01: via INTRAVENOUS

## 2022-02-28 MED ORDER — SODIUM CHLORIDE 0.9 % IV PIGGY BACK
100 mg | INTRAVENOUS | Status: AC
Start: 2022-02-28 — End: 2022-02-28
  Administered 2022-02-28: via INTRAVENOUS

## 2022-02-28 MED ORDER — SODIUM CHLORIDE 0.9 % IV
10 gram | INTRAVENOUS | Status: DC
Start: 2022-02-28 — End: 2022-02-28
  Administered 2022-02-28: 23:00:00 via INTRAVENOUS

## 2022-02-28 MED ORDER — DIPHENHYDRAMINE HCL 50 MG/ML IJ SOLN
50 mg/mL | INTRAMUSCULAR | Status: DC
Start: 2022-02-28 — End: 2022-02-28
  Administered 2022-02-28: 20:00:00 via INTRAVENOUS

## 2022-02-28 MED FILL — VANCOMYCIN 10 GRAM IV SOLR: 10 gram | INTRAVENOUS | Qty: 2500

## 2022-02-28 MED FILL — DIPHENHYDRAMINE HCL 50 MG/ML IJ SOLN: 50 mg/mL | INTRAMUSCULAR | Qty: 1

## 2022-02-28 MED FILL — DEXAMETHASONE SODIUM PHOSPHATE (PF) 10 MG/ML INJECTION: 10 mg/mL | INTRAMUSCULAR | Qty: 1

## 2022-02-28 MED FILL — ISOVUE-300  61 % INTRAVENOUS SOLUTION: 300 mg iodine /mL (61 %) | INTRAVENOUS | Qty: 100

## 2022-02-28 MED FILL — DOXY-100 100 MG INTRAVENOUS SOLUTION: 100 mg | INTRAVENOUS | Qty: 100

## 2022-02-28 NOTE — Progress Notes (Signed)
 Ketotifen  0.025% is P[employer] approved autosubstitution for Patanol 0.1%.  Contact Pharmacy with any questions.

## 2022-02-28 NOTE — Progress Notes (Signed)
 Pharmacist Note - Vancomycin  Dosing    Consult provided for this 81 y.o. female for indication of cellulitis. (Dr. Joni asked me to dose and stated he would follow up with formal consult).  Antibiotic regimen(s): Vancomycin  + Cefepime   Patient on vancomycin  PTA? NO     Recent Labs     02/28/22  1413   WBC 8.1   CREA 0.75   BUN 13     Frequency of BMP: Daily x 3  Height: 162.6 cm  Weight: 99.7 kg  Est CrCl: 70.9 ml/min    Temp (24hrs), Avg:98.3 F (36.8 C), Min:98.3 F (36.8 C), Max:98.3 F (36.8 C)    Cultures:  02/28/22 Blood cx    MRSA Swab ordered (if applicable)? N/A    The plan below is expected to result in a target range of AUC/MIC 400-500    Therapy will be initiated with a loading dose of 2500 mg IV x 1 to be followed by a maintenance dose of 1250 mg IV every 18 hours.  Pharmacy to follow patient daily and order levels / make dose adjustments as appropriate.    *Vancomycin  has been dosed used Bayesian kinetics software to target an AUC/MIC of 400-600, which provides adequate exposure for an assumed infection due to MRSA with an MIC of 1 or less while reducing the risk of nephrotoxicity as seen with traditional trough based dosing goals.

## 2022-02-28 NOTE — ED Provider Notes (Signed)
ED Provider Notes by Dustin Flock, PA-C at 02/28/22 1436                Author: Dustin Flock, PA-C  Service: --  Author Type: Physician Assistant       Filed: 02/28/22 1902  Date of Service: 02/28/22 1436  Status: Attested           Editor: Dustin Flock, PA-C (Physician Assistant)  Cosigner: Tamera Stands, DO at 03/01/22 2041          Attestation signed by Tamera Stands, DO at 03/01/22 2041          I was personally available for consultation in the emergency department.  I have reviewed the chart and agree with the documentation recorded by the [facility], including  the assessment, treatment plan, and disposition.   Tamera Stands, DO                                    Eye Pain    Associated symptoms include pain. Pertinent negatives  include no discharge, no eye redness, no nausea, no vomiting and no fever.        Jamie Bryant is a 81 y.o. female with Hx of diabetes, HTN, dementia who presents ambulatory to [facility] ED with cc of R eye pain, pruritus, and swelling upon awakening. Patient reports waking up with swollen pruritic painful R eye since this morning.  Denies fevers, chills, trauma, insect bites, eye drainage, any other symptoms. Patient notes no issues with her eye yesterday. Was seen by Dr Craig Guess with ophthalmology today, concern for cellulitis not involving orbit, eye exam unremarkable.          PCP: None      There are no other complaints, changes or physical findings at this time.        Past Medical History:        Diagnosis  Date         ?  Asthma       ?  Diabetes ([place])       ?  Fibromyalgia       ?  Hyperlipemia       ?  Hypertension       ?  Psychotic disorder ([place])           ?  Stroke [facility])               Past Surgical History:         Procedure  Laterality  Date          ?  HX COLONOSCOPY              ?  HX HYSTERECTOMY                   Family History:         Problem  Relation  Age of Onset          ?  Breast Cancer  Maternal Grandmother               Social  History          Socioeconomic History         ?  Marital status:  DIVORCED              Spouse name:  Not on file         ?  Number of children:  Not on file     ?  Years of education:  Not on file     ?  Highest education level:  Not on file       Occupational History        ?  Not on file       Tobacco Use         ?  Smoking status:  Never     ?  Smokeless tobacco:  Never       Substance and Sexual Activity         ?  Alcohol use:  No     ?  Drug use:  No     ?  Sexual activity:  Not on file        Other Topics  Concern        ?  Not on file       Social History Narrative        ?  Not on file          Social Determinants of Health          Financial Resource Strain: Not on file     Food Insecurity: Not on file     Transportation Needs: Not on file     Physical Activity: Not on file     Stress: Not on file     Social Connections: Not on file     Intimate Partner Violence: Not on file       Housing Stability: Not on file              ALLERGIES: Ampicillin, Lidocaine, Shingrix (pf) [varicella-zoster ge-as01b (pf)], and Statins-hmg-coa reductase inhibitors      Review of Systems    Constitutional:  Negative for activity change, appetite change, chills and fever.    HENT:  Negative for congestion, rhinorrhea and sore throat.     Eyes:  Positive for pain and itching . Negative for discharge and redness.    Respiratory:  Negative for cough and shortness of breath.     Cardiovascular:  Negative for chest pain.    Gastrointestinal:  Negative for abdominal pain, nausea and vomiting.    Genitourinary:  Negative for decreased urine volume and difficulty urinating.    Musculoskeletal:  Negative for arthralgias and myalgias.    Skin:  Positive for rash. Negative for color change and pallor.    Neurological:  Negative for light-headedness and headaches.    Psychiatric/Behavioral:  Negative for agitation and confusion. The patient is not nervous/anxious.          Vitals:           02/28/22 1355  02/28/22 1808         BP:  (!)  170/106       Pulse:  93       Resp:  16       Temp:  98.3 ??F (36.8 ??C)       SpO2:  93%       Weight:    99.7 kg (219 lb 12.8 oz)         Height:    '5\' 4"'  (1.626 m)                Physical Exam   Vitals and nursing note reviewed.    Constitutional:        Appearance: Normal appearance.    HENT:       Head: Normocephalic and  atraumatic.       Right Ear: External ear normal.       Left Ear: External ear normal.       Nose: Nose normal.       Mouth/Throat:       Mouth: Mucous membranes are moist.    Eyes:       Extraocular Movements: Extraocular movements intact.       Conjunctiva/sclera: Conjunctivae normal.       Pupils: Pupils are equal, round, and reactive to light.       Comments: Significant swelling to right eyelids making eye exam difficult.  No obvious ecchymosis, injection, lesions to the eye.  Extraocular movements intact and nonpainful.      Cardiovascular:       Rate and Rhythm: Normal rate and regular rhythm.       Pulses: Normal pulses.       Heart sounds: Normal heart sounds.    Pulmonary:       Effort: Pulmonary effort is normal.       Breath sounds: Normal breath sounds.     Abdominal:       Palpations: Abdomen is soft.     Musculoskeletal:          General: Normal range of motion.       Cervical back: Normal range of motion and neck supple.    Skin:      General: Skin is warm and dry.       Capillary Refill: Capillary refill takes less than 2 seconds.       Comments: Skin of right forehead, periorbital region, and ear are significantly edematous and erythematous.  Mild tenderness to palpation.    Neurological:       General: No focal deficit present.       Mental Status: She is alert and oriented to person, place, and time. Mental status is at baseline.    Psychiatric:          Mood and Affect: Mood normal.          Behavior: Behavior normal.          Thought Content: Thought content normal.          Judgment: Judgment normal.           Medical Decision Making   Ddx: Cellulitis, orbital cellulitis,  allergic dermatitis, and others      81 year old female presents with swelling of right forehead, periorbital region and ear since this morning.  On exam, it appears very erythematous and edematous.  Patient seen by ophthalmology this morning and had a normal eye exam; spoke to them on the  phone and their concern was for cellulitis not involving the orbit.  Initially gave Decadron for suspicion of allergic reaction (and Benadryl which patient refused).  Afebrile, WBC not elevated. CT remarkable for R preorbital and periorbital cellulitis.   Discussed with attending and felt that patient would be a candidate for inpatient treatment as she has high risk for infection and quick spread of cellulitis. Admitted to hospitalist. Ordered antibiotics.        Amount and/or Complexity of Data Reviewed   Labs: ordered.   Radiology: ordered.      Risk   Prescription drug management.   Decision regarding hospitalization.                Procedures      LABORATORY TESTS:     Recent Results (from the past 12 hour(s))  SAMPLES BEING HELD          Collection Time: 02/28/22  2:13 PM         Result  Value  Ref Range            SAMPLES BEING HELD  1LAV 1PST 1RED         COMMENT                  Add-on orders for these samples will be processed based on acceptable specimen integrity and analyte stability, which may vary by analyte.       CBC WITH AUTOMATED DIFF          Collection Time: 02/28/22  2:13 PM         Result  Value  Ref Range            WBC  8.1  3.6 - 11.0 K/uL       RBC  4.23  3.80 - 5.20 M/uL       HGB  11.8  11.5 - 16.0 g/dL       HCT  38.8  35.0 - 47.0 %       MCV  91.7  80.0 - 99.0 FL       MCH  27.9  26.0 - 34.0 PG       MCHC  30.4  30.0 - 36.5 g/dL       RDW  14.6 (H)  11.5 - 14.5 %       PLATELET  389  150 - 400 K/uL       MPV  11.4  8.9 - 12.9 FL       NRBC  0.0  0 PER 100 WBC       ABSOLUTE NRBC  0.00  0.00 - 0.01 K/uL       NEUTROPHILS  64  32 - 75 %       LYMPHOCYTES  23  12 - 49 %       MONOCYTES  7  5 - 13 %        EOSINOPHILS  5  0 - 7 %       BASOPHILS  1  0 - 1 %       IMMATURE GRANULOCYTES  0  0.0 - 0.5 %       ABS. NEUTROPHILS  5.2  1.8 - 8.0 K/UL       ABS. LYMPHOCYTES  1.8  0.8 - 3.5 K/UL       ABS. MONOCYTES  0.5  0.0 - 1.0 K/UL       ABS. EOSINOPHILS  0.4  0.0 - 0.4 K/UL       ABS. BASOPHILS  0.1  0.0 - 0.1 K/UL       ABS. IMM. GRANS.  0.0  0.00 - 0.04 K/UL       DF  AUTOMATED          METABOLIC PANEL, COMPREHENSIVE          Collection Time: 02/28/22  2:13 PM         Result  Value  Ref Range            Sodium  139  136 - 145 mmol/L       Potassium  4.1  3.5 - 5.1 mmol/L       Chloride  104  97 - 108 mmol/L       CO2  31  21 - 32 mmol/L  Anion gap  4 (L)  5 - 15 mmol/L       Glucose  143 (H)  65 - 100 mg/dL       BUN  13  6 - 20 MG/DL       Creatinine  0.75  0.55 - 1.02 MG/DL       BUN/Creatinine ratio  17  12 - 20         eGFR  >60  >60 ml/min/1.59m       Calcium  9.5  8.5 - 10.1 MG/DL       Bilirubin, total  0.2  0.2 - 1.0 MG/DL       ALT (SGPT)  26  12 - 78 U/L       AST (SGOT)  12 (L)  15 - 37 U/L       Alk. phosphatase  81  45 - 117 U/L       Protein, total  7.8  6.4 - 8.2 g/dL       Albumin  4.1  3.5 - 5.0 g/dL       Globulin  3.7  2.0 - 4.0 g/dL            A-G Ratio  1.1  1.1 - 2.2             IMAGING RESULTS:     CT MAXILLOFACIAL W CONT       Final Result     Right preorbital and periorbital soft tissue swelling and cellulitis     No evidence for right postseptal cellulitis or intracranial involvement.                       MEDICATIONS GIVEN:     Medications       doxycycline (VIBRAMYCIN) 100 mg in 0.9% sodium chloride (MBP/ADV) 100 mL MBP (100 mg IntraVENous New Bag 02/28/22 1834)     vancomycin (VANCOCIN) 2500 mg in NS 500 mL infusion (has no administration in time range)     dexamethasone (PF) (DECADRON) 10 mg/mL injection 8 mg (8 mg IntraVENous Given 02/28/22 1605)       iopamidoL (ISOVUE 300) 300 mg iodine /mL (61 %) contrast injection 100 mL (100 mL IntraVENous Given 02/28/22 1706)            IMPRESSION:      1.  Periorbital cellulitis of right eye         2.  Cellulitis, face            PLAN:   Admit for IV antibiotics           Perfect Serve Consult for Admission   5:27 PM      ED Room Number: R36/R36   Patient Name and age:  Jamie MANGES89y.o.  female   Working Diagnosis:       1.  Periorbital cellulitis of right eye         2.  Cellulitis, face            COVID-19 Suspicion:  no   Sepsis present:  no  Reassessment needed: no   Code Status:  Full Code   Readmission: no   Isolation Requirements:  no   Recommended Level of Care:  med/surg   [facility] [phone]     Other: 81year old female with history of diabetes and mild dementia presents with significant swelling and pain to her right forehead, eyelids,  and ear beginning this morning.  On exam, there is significant erythema and edema to those areas.  Patient  was seen by ophthalmology this morning, eye exam unremarkable, no concern for orbital involvement.  CT remarkable for right preorbital and periorbital cellulitis.  Discussed with attending and we agreed that patient would benefit from admission for IV  antibiotics as she is high risk for worsening infection, and her current cellulitis had a very quick onset..  1 dose of Decadron and Benadryl in the ER for CT scan, initially thought this was more of an allergic reaction.  No antibiotics received yet.

## 2022-02-28 NOTE — ED Notes (Signed)
 Arrives ambulatory with rollator with family for c/o waking up with right eye swelling.  Unable to open the eye.  Redness and swelling extending to forehead and into right ear.     Denies fevers.

## 2022-02-28 NOTE — ED Notes (Signed)
 Verbal shift change report given to Olivia, Rn  (Cabin crew) by Rosina, RN  (offgoing nurse). Report included the following information SBAR, Kardex, ED Summary, MAR, and Recent Results.

## 2022-02-28 NOTE — Progress Notes (Signed)
Clinical Pharmacy Note - Extended Infusion Cefepime Dosing    This patient has been ordered cefepime at 1 g IV every 12 hours. P&T protocol allows automatic substitution to extended-infusion cefepime and dose adjustment based on indication and renal function.    Indication: SSTI (facial cellulitis)    CrCl: 70.9 mL/min    Per P&T protocol, the cefepime will initiated with a 2 gram loading dose (given IV push over 3 minutes) which will be followed by a maintenance regimen of 2 gram Q 12 hours (each dose will be infused over 4 hours). Pharmacy will continue to monitor this patient daily for changes in clinical status and renal function.    Please call pharmacy with any questions.

## 2022-02-28 NOTE — ED Provider Notes (Signed)
Also evaluated patient.  Patient is in no respiratory distress and is able to answer questions.  Significant swelling to right facial area to include upper and lower eyelids.  Was able to manually spread eyelids and do not see any chemosis    Complaint more consistent with contact dermatitis but is also concerning for infeciton

## 2022-02-28 NOTE — H&P (Signed)
H&P by Fredrik CoveZaw, Otha Rickles T, MD at 02/28/22  1752                Author: Fredrik CoveZaw, Deena Shaub T, MD  Service: Hospitalist  Author Type: Physician       Filed: 02/28/22 1801  Date of Service: 02/28/22 1752  Status: Signed          Editor: Fredrik CoveZaw, Waleska Buttery T, MD (Physician)                                History and Physical      Date of Service:  02/28/2022   Primary Care Provider: None   Source of information: The patient and Chart review      Chief Complaint: Eye Pain           History of Presenting Illness:       Jamie Bryant is a 81 y.o. female who presents with complaint of swelling around the right eye.  Patient was seen by ophthalmology this morning and eye exam  was unremarkable and no orbital involvement on exam.  However with a concern of cellulitis around her eyes, patient was subsequently sent to the emergency room.  In the emergency room, lab works are unremarkable, CT of the maxillofacial shows right preorbital  and periorbital soft tissue swelling and cellulitis without evidence of septal cellulitis or intracranial involvement.  Patient was admitted for further evaluation management.      The patient denies any headache, blurry vision, sore throat, trouble swallowing, trouble with speech, chest pain, SOB, cough, fever, chills, N/V/D, abd pain, urinary symptoms, constipation, recent travels, sick contacts, focal or generalized neurological  symptoms, falls, injuries, rashes, contact with COVID-19 diagnosed patients, hematemesis, melena, hemoptysis, hematuria, rashes, denies starting any new medications and denies any other concerns or problems besides as mentioned above.             REVIEW OF SYSTEMS:   A comprehensive review of systems was negative except for that written in the History of Present Illness.         Past Medical History:        Diagnosis  Date         ?  Asthma       ?  Diabetes ([facility])       ?  Fibromyalgia       ?  Hyperlipemia       ?  Hypertension       ?  Psychotic disorder ([facility])           ?  Stroke  [facility](HCC)             Past Surgical History:         Procedure  Laterality  Date          ?  HX COLONOSCOPY              ?  HX HYSTERECTOMY              Prior to Admission medications             Medication  Sig  Start Date  End Date  Taking?  Authorizing Provider            quinapriL (ACCUPRIL) 40 mg tablet  Take 1 Tablet by mouth nightly. Indication: high blood pressure  12/25/21      Vianne BullsBonner, Lloyd A, MD     estradioL (ESTRACE) 1  mg tablet  Take 1 Tablet by mouth daily.  10/26/21      Mallie Snooks, MD     omeprazole (PRILOSEC) 40 mg capsule  Take 1 Capsule by mouth daily.  10/10/21      Mallie Snooks, MD     metoprolol tartrate (LOPRESSOR) 50 mg tablet  Take 50 mg by mouth two (2) times a day.        Provider, Historical     SITagliptin (Januvia) 100 mg tablet  Take 1 Tab by mouth daily. Indication: diabetes  02/02/20      Mallie Snooks, MD     pregabalin (LYRICA) 150 mg capsule  Take 150 mg by mouth three (3) times daily.        Provider, Historical     torsemide (DEMADEX) 20 mg tablet  Take  by mouth daily.        Provider, Historical     linaclotide (LINZESS) 290 mcg cap capsule  Take  by mouth Daily (before breakfast).        Provider, Historical     cholecalciferol, VITAMIN D3, (VITAMIN D3) 5,000 unit tab tablet  Take  by mouth daily.        Provider, Historical     co-enzyme Q-10 (COQ-10) 100 mg capsule  Take 100 mg by mouth daily.        Provider, Historical     aspirin 81 mg chewable tablet  Take 81 mg by mouth daily.        Provider, Historical     MV,CAL,MIN/IRON/FOLIC ACID/LUT (COMPLETE MULTI PO)  Take  by mouth.        Provider, Historical     beclomethasone (QVAR) 40 mcg/actuation aero  Take 1 Puff by inhalation two (2) times a day.        Provider, Historical     TRIAMCINOLONE ACETONIDE (NASACORT NA)  by Nasal route.        Provider, Historical     olopatadine (PATANOL) 0.1 % ophthalmic solution  Administer 2 Drops to both eyes two (2) times a day.        Provider, Historical            DOCUSATE CALCIUM  (STOOL SOFTENER PO)  Take  by mouth three (3) times daily as needed.        Provider, Historical          Allergies        Allergen  Reactions         ?  Ampicillin  Rash     ?  Lidocaine  Rash     ?  Shingrix (Pf) [Varicella-Zoster Ge-As01b (Pf)]  Unknown (comments)     ?  Statins-Hmg-Coa Reductase Inhibitors  Myalgia             Lovastatin, pravastatin, simvastatin, atorvastatin, zetia, livalo, lovaza           Family History         Problem  Relation  Age of Onset          ?  Breast Cancer  Maternal Grandmother           Social History:  reports that she has never smoked. She has never used smokeless tobacco. She reports that she does not drink alcohol and does not use drugs.      Social Determinants of Health          Tobacco Use: Low Risk         ?  Smoking Tobacco Use: Never     ?  Smokeless Tobacco Use: Never     ?  Passive Exposure: Not on file       Alcohol Use: Not on file     Financial Resource Strain: Not on file     Food Insecurity: Not on file     Transportation Needs: Not on file     Physical Activity: Not on file     Stress: Not on file     Social Connections: Not on file     Intimate Partner Violence: Not on file     Depression: Not at risk        ?  PHQ-2 Score: 0       Housing Stability: Not on file            Medications were reconciled to the best of my ability given all available resources at the time of admission. Route is PO if not otherwise noted.       Family and social history were personally reviewed, all pertinent and relevant details are outlined as above.        Objective:     Visit Vitals      BP  (!) 170/106     Pulse  93     Temp  98.3 ??F (36.8 ??C)     Resp  16        SpO2  93%               PHYSICAL EXAM:    General: Alert x oriented x 3, awake, no acute distress,    HEENT: PEERL, EOMI, moist mucus membranes, swelling erythema involving right eyelid and around the right eye and right side of the face   Neck: Supple, no JVD, no meningeal signs   Chest: Clear to auscultation  bilaterally    CVS: RRR, S1 S2 heard, no murmurs/rubs/gallops   Abd: Soft, non-tender, non-distended, +bowel sounds    Ext: No clubbing, no cyanosis, no edema   Neuro/Psych: Pleasant mood and affect, CN 2-12 grossly intact, sensory grossly within normal limit, Strength 5/5 in all extremities, DTR 1+ x 4   Cap refill: Brisk, less than 3 seconds   Pulses: 2+, symmetric in all extremities   Skin: Warm, dry, without rashes or lesions      Data Review:    I have independently reviewed and interpreted patient's lab and all other diagnostic data        Abnormal Labs Reviewed       CBC WITH AUTOMATED DIFF - Abnormal; Notable for the following components:            Result  Value            RDW  14.6 (*)            All other components within normal limits       METABOLIC PANEL, COMPREHENSIVE - Abnormal; Notable for the following components:            Anion gap  4 (*)         Glucose  143 (*)         AST (SGOT)  12 (*)            All other components within normal limits             All Micro Results                  Procedure  Component  Value  Units  Date/Time           CULTURE, BLOOD, PAIRED [[id number]]              Order Status: Sent  Specimen: Blood                       IMAGING:      CT MAXILLOFACIAL W CONT       Final Result     Right preorbital and periorbital soft tissue swelling and cellulitis     No evidence for right postseptal cellulitis or intracranial involvement.                        ECG/ECHO:       Results for orders placed or performed during the hospital encounter of 03/06/20     EKG, 12 LEAD, INITIAL         Result  Value  Ref Range            Ventricular Rate  69  BPM       Atrial Rate  69  BPM       P-R Interval  134  ms       QRS Duration  80  ms       Q-T Interval  380  ms       QTC Calculation (Bezet)  407  ms       Calculated P Axis  64  degrees       Calculated R Axis  -6  degrees       Calculated T Axis  30  degrees       Diagnosis                 Normal sinus rhythm   No previous ECGs  available   Confirmed by Dorna BloomWu, M.D., Remi DeterSamuel [phone]([id number]) on 03/06/2020 11:48:07 AM                  Notes reviewed from all clinical/nonclinical/nursing services involved in patient's clinical care. Care coordination discussions were held with appropriate clinical/nonclinical/ nursing providers based on care coordination needs.         Assessment:     Given the patient's current clinical presentation, there is a high level of concern for decompensation if discharged from the emergency department. Complex decision making was performed,  which includes reviewing the patient's available past medical records, laboratory results, and imaging studies.      Active Problems:     Cellulitis (02/28/2022)              Plan:        Preseptal cellulitis   -Admit for IV antibiotics and close monitoring   -Given penicillin allergy, will start on cefepime and vancomycin, obtain blood cultures   -Tailor antibiotics based on clinical response and culture results      Hypertension   -Resume metoprolol and Accupril   -Monitor blood pressure      Diabetes type 2   -Start insulin sliding scale coverage   -Monitor blood sugars      GERD   -Continue PPI      Fibromyalgia   -Continue Lyrica      Estimated length of stay is 2 midnights.      DIET: No diet orders on file    ISOLATION PRECAUTIONS: There are currently no Active Isolations   CODE STATUS: No Order    DVT  PROPHYLAXIS: Lovenox   FUNCTIONAL STATUS PRIOR TO HOSPITALIZATION: Fully active and ambulatory; able to carry on all self-care without restriction.   Ambulatory status/function: By self    EARLY MOBILITY ASSESSMENT: Recommend routine ambulation while hospitalized with the assistance of nursing staff   ANTICIPATED DISCHARGE: 24-48 hours.   ANTICIPATED DISPOSITION: Home   EMERGENCY CONTACT/SURROGATE DECISION MAKER:       CRITICAL CARE WAS PERFORMED FOR THIS ENCOUNTER: NO.            Signed By:  Fredrik Cove, MD           February 28, 2022             Please note that this dictation may have been  completed with Dragon, the computer voice recognition software.  Quite often unanticipated grammatical, syntax, homophones, and other interpretive  errors are inadvertently transcribed by the computer software.  Please disregard these errors.  Please excuse any errors that have escaped final proofreading.

## 2022-02-28 NOTE — Progress Notes (Signed)
 Budesonide  0.25 mg/2 mL nebs is P&T approved autosubstitution for QVAR 40 mcg.  Contact Pharmacy with any questions.

## 2022-02-28 NOTE — ED Notes (Signed)
Has a final transfer review been performed? Yes    Reason for Admission: periorbital cellulitis of right eye  Patient comes from: home  Mental Status: Alert and oriented  VWU:JWJXBJY assistance  Ambulation:ambulates with: rollator  Pertinent Info/Safety Concerns: fall risk   COVID Status: Not Tested  MEWS Score: 1     Vitals:    02/28/22 1808 02/28/22 2008 02/28/22 2136 02/28/22 2210   BP:  (!) 164/74 (!) 157/63 (!) 150/61   Pulse:  82 98 87   Resp:   19 19   Temp:   98.6 F (37 C)    SpO2:  95% 94% 91%   Weight: 99.7 kg (219 lb 12.8 oz)      Height: 5\' 4"  (1.626 m)        Lines:   Peripheral IV 02/28/22 Right Antecubital (Active)   Site Assessment Clean, dry, & intact 02/28/22 1415   Phlebitis Assessment 0 02/28/22 1415   Infiltration Assessment 0 02/28/22 1415   Dressing Status Clean, dry, & intact 02/28/22 1415   Hub Color/Line Status Pink;Patent 02/28/22 1415   Action Taken Blood drawn 02/28/22 1415   Alcohol Cap Used No 02/28/22 1415      Transport mode:ED stretcher    Jamie Bryant  being transferred to 5E(unit) for routine progression of care     "Notification of etransfer note given to (Name) Baird Lyons (Title) staff

## 2022-03-01 LAB — CBC WITH AUTOMATED DIFF
ABS. BASOPHILS: 0 10*3/uL (ref 0.0–0.1)
ABS. EOSINOPHILS: 0 10*3/uL (ref 0.0–0.4)
ABS. IMM. GRANS.: 0.1 10*3/uL — ABNORMAL HIGH (ref 0.00–0.04)
ABS. LYMPHOCYTES: 0.7 10*3/uL — ABNORMAL LOW (ref 0.8–3.5)
ABS. MONOCYTES: 0.1 10*3/uL (ref 0.0–1.0)
ABS. NEUTROPHILS: 7.2 10*3/uL (ref 1.8–8.0)
ABSOLUTE NRBC: 0 10*3/uL (ref 0.00–0.01)
BASOPHILS: 0 % (ref 0–1)
EOSINOPHILS: 0 % (ref 0–7)
HCT: 33.4 % — ABNORMAL LOW (ref 35.0–47.0)
HGB: 10.5 g/dL — ABNORMAL LOW (ref 11.5–16.0)
IMMATURE GRANULOCYTES: 1 % — ABNORMAL HIGH (ref 0.0–0.5)
LYMPHOCYTES: 9 % — ABNORMAL LOW (ref 12–49)
MCH: 27.9 pg (ref 26.0–34.0)
MCHC: 31.4 g/dL (ref 30.0–36.5)
MCV: 88.8 fL (ref 80.0–99.0)
MONOCYTES: 1 % — ABNORMAL LOW (ref 5–13)
MPV: 11.4 fL (ref 8.9–12.9)
NEUTROPHILS: 89 % — ABNORMAL HIGH (ref 32–75)
NRBC: 0 /100{WBCs}
PLATELET: 330 10*3/uL (ref 150–400)
RBC: 3.76 M/uL — ABNORMAL LOW (ref 3.80–5.20)
RDW: 15.1 % — ABNORMAL HIGH (ref 11.5–14.5)
WBC: 8.1 10*3/uL (ref 3.6–11.0)

## 2022-03-01 LAB — METABOLIC PANEL, BASIC
Anion gap: 7 mmol/L (ref 5–15)
BUN/Creatinine ratio: 18 (ref 12–20)
BUN: 14 mg/dL (ref 6–20)
CO2: 22 mmol/L (ref 21–32)
Calcium: 8.8 mg/dL (ref 8.5–10.1)
Chloride: 107 mmol/L (ref 97–108)
Creatinine: 0.78 mg/dL (ref 0.55–1.02)
Glucose: 195 mg/dL — ABNORMAL HIGH (ref 65–100)
Potassium: 5.4 mmol/L — ABNORMAL HIGH (ref 3.5–5.1)
Sodium: 136 mmol/L (ref 136–145)
eGFR: >60 mL/min/{1.73_m2} (ref >60)

## 2022-03-01 LAB — GLUCOSE, POC: Glucose (POC): 129 mg/dL — ABNORMAL HIGH (ref 65–117)

## 2022-03-01 LAB — BASIC METABOLIC PANEL
Anion Gap: 7 mmol/L (ref 5–15)
BUN: 14 MG/DL (ref 6–20)
Bun/Cre Ratio: 18 (ref 12–20)
CO2: 22 mmol/L (ref 21–32)
Calcium: 8.8 MG/DL (ref 8.5–10.1)
Chloride: 107 mmol/L (ref 97–108)
Creatinine: 0.78 MG/DL (ref 0.55–1.02)
ESTIMATED GLOMERULAR FILTRATION RATE: >60 mL/min/{1.73_m2} (ref >60)
Glucose: 195 mg/dL — ABNORMAL HIGH (ref 65–100)
Potassium: 5.4 mmol/L — ABNORMAL HIGH (ref 3.5–5.1)
Sodium: 136 mmol/L (ref 136–145)

## 2022-03-01 LAB — CBC WITH AUTO DIFFERENTIAL
Basophils %: 0 % (ref 0–1)
Basophils Absolute: 0 10*3/uL (ref 0.0–0.1)
Eosinophils %: 0 % (ref 0–7)
Eosinophils Absolute: 0 10*3/uL (ref 0.0–0.4)
Granulocyte Absolute Count: 0.1 10*3/uL — ABNORMAL HIGH (ref 0.00–0.04)
Hematocrit: 33.4 % — ABNORMAL LOW (ref 35.0–47.0)
Hemoglobin: 10.5 g/dL — ABNORMAL LOW (ref 11.5–16.0)
Immature Granulocytes: 1 % — ABNORMAL HIGH (ref 0.0–0.5)
Lymphocytes %: 9 % — ABNORMAL LOW (ref 12–49)
Lymphocytes Absolute: 0.7 10*3/uL — ABNORMAL LOW (ref 0.8–3.5)
MCH: 27.9 PG (ref 26.0–34.0)
MCHC: 31.4 g/dL (ref 30.0–36.5)
MCV: 88.8 FL (ref 80.0–99.0)
MPV: 11.4 FL (ref 8.9–12.9)
Monocytes %: 1 % — ABNORMAL LOW (ref 5–13)
Monocytes Absolute: 0.1 10*3/uL (ref 0.0–1.0)
NRBC Absolute: 0 10*3/uL (ref 0.00–0.01)
Neutrophils %: 89 % — ABNORMAL HIGH (ref 32–75)
Neutrophils Absolute: 7.2 10*3/uL (ref 1.8–8.0)
Nucleated RBCs: 0 PER 100 WBC
Platelets: 330 10*3/uL (ref 150–400)
RBC: 3.76 M/uL — ABNORMAL LOW (ref 3.80–5.20)
RDW: 15.1 % — ABNORMAL HIGH (ref 11.5–14.5)
WBC: 8.1 10*3/uL (ref 3.6–11.0)

## 2022-03-01 LAB — POCT GLUCOSE: POC Glucose: 129 mg/dL — ABNORMAL HIGH (ref 65–117)

## 2022-03-01 MED ORDER — CEFEPIME 2 GRAM SOLUTION FOR INJECTION
2 gram | INTRAMUSCULAR | Status: AC
Start: 2022-03-01 — End: 2022-02-28
  Administered 2022-03-01: 03:00:00 via INTRAVENOUS

## 2022-03-01 MED ORDER — ONDANSETRON (PF) 4 MG/2 ML INJECTION
4 mg/2 mL | Freq: Four times a day (QID) | INTRAMUSCULAR | Status: DC | PRN
Start: 2022-03-01 — End: 2022-03-08
  Administered 2022-03-05: 22:00:00 via INTRAVENOUS

## 2022-03-01 MED ORDER — GLUCOSE 4 GRAM CHEWABLE TAB
4 gram | ORAL | Status: DC | PRN
Start: 2022-03-01 — End: 2022-03-08

## 2022-03-01 MED ORDER — .PHARMACY TO SUBSTITUTE PER PROTOCOL
Status: DC | PRN
Start: 2022-03-01 — End: 2022-02-28

## 2022-03-01 MED ORDER — SODIUM CHLORIDE 0.9 % IV PIGGY BACK
2 gram | Freq: Two times a day (BID) | INTRAVENOUS | Status: DC
Start: 2022-03-01 — End: 2022-03-04
  Administered 2022-03-01 – 2022-03-04 (×7): via INTRAVENOUS

## 2022-03-01 MED ORDER — SODIUM CHLORIDE 0.9 % IJ SYRG
Freq: Three times a day (TID) | INTRAMUSCULAR | Status: DC
Start: 2022-03-01 — End: 2022-03-08
  Administered 2022-03-01 – 2022-03-08 (×25): via INTRAVENOUS

## 2022-03-01 MED ORDER — ENOXAPARIN 40 MG/0.4 ML SUB-Q SYRINGE
40 mg/0.4 mL | Freq: Every day | SUBCUTANEOUS | Status: DC
Start: 2022-03-01 — End: 2022-03-08
  Administered 2022-03-01 – 2022-03-08 (×8): via SUBCUTANEOUS

## 2022-03-01 MED ORDER — SODIUM CHLORIDE 0.9 % IJ SYRG
INTRAMUSCULAR | Status: DC | PRN
Start: 2022-03-01 — End: 2022-03-08

## 2022-03-01 MED ORDER — ESTRADIOL 1 MG TAB
1 mg | Freq: Every day | ORAL | Status: DC
Start: 2022-03-01 — End: 2022-03-01

## 2022-03-01 MED ORDER — ACETAMINOPHEN 325 MG TABLET
325 mg | Freq: Four times a day (QID) | ORAL | Status: DC | PRN
Start: 2022-03-01 — End: 2022-03-08
  Administered 2022-03-01: 06:00:00 via ORAL

## 2022-03-01 MED ORDER — ASPIRIN 81 MG CHEWABLE TAB
81 mg | Freq: Every day | ORAL | Status: DC
Start: 2022-03-01 — End: 2022-03-08
  Administered 2022-03-01 – 2022-03-08 (×8): via ORAL

## 2022-03-01 MED ORDER — DEXTROSE 10% BOLUS IV (NEONATE)
10 % | INTRAVENOUS | Status: DC | PRN
Start: 2022-03-01 — End: 2022-03-08

## 2022-03-01 MED ORDER — VIAL2BAG ADAPTOR (20 MM)
1000 mg | Status: DC
Start: 2022-03-01 — End: 2022-02-28
  Administered 2022-03-01: 02:00:00 via INTRAVENOUS

## 2022-03-01 MED ORDER — PREGABALIN 100 MG CAP
100 mg | Freq: Two times a day (BID) | ORAL | Status: DC
Start: 2022-03-01 — End: 2022-03-08
  Administered 2022-03-01 – 2022-03-08 (×15): via ORAL

## 2022-03-01 MED ORDER — PANTOPRAZOLE 20 MG TAB, DELAYED RELEASE
20 mg | Freq: Every day | ORAL | Status: DC
Start: 2022-03-01 — End: 2022-03-08
  Administered 2022-03-01 – 2022-03-08 (×8): via ORAL

## 2022-03-01 MED ORDER — PREGABALIN 25 MG CAP
25 mg | Freq: Three times a day (TID) | ORAL | Status: DC
Start: 2022-03-01 — End: 2022-03-01

## 2022-03-01 MED ORDER — INSULIN LISPRO 100 UNIT/ML INJECTION
100 unit/mL | Freq: Four times a day (QID) | SUBCUTANEOUS | Status: DC
Start: 2022-03-01 — End: 2022-03-08
  Administered 2022-03-02 – 2022-03-08 (×16): via SUBCUTANEOUS

## 2022-03-01 MED ORDER — ACETAMINOPHEN 650 MG RECTAL SUPPOSITORY
650 mg | Freq: Four times a day (QID) | RECTAL | Status: DC | PRN
Start: 2022-03-01 — End: 2022-03-08

## 2022-03-01 MED ORDER — CARVEDILOL 12.5 MG TAB
12.5 mg | Freq: Two times a day (BID) | ORAL | Status: DC
Start: 2022-03-01 — End: 2022-03-08
  Administered 2022-03-01 – 2022-03-08 (×15): via ORAL

## 2022-03-01 MED ORDER — VIAL2BAG ADAPTOR (20 MM)
1.25 gram | Status: DC
Start: 2022-03-01 — End: 2022-03-03
  Administered 2022-03-01 – 2022-03-03 (×4): via INTRAVENOUS

## 2022-03-01 MED ORDER — .PHARMACY TO SUBSTITUTE PER PROTOCOL
Status: DC | PRN
Start: 2022-03-01 — End: 2022-03-01

## 2022-03-01 MED ORDER — PHARMACY VANCOMYCIN NOTE
Status: DC
Start: 2022-03-01 — End: 2022-03-03

## 2022-03-01 MED ORDER — KETOTIFEN 0.025 % (0.035 %) EYE DROPS
0.025 % (0.035 %) | Freq: Two times a day (BID) | OPHTHALMIC | Status: DC
Start: 2022-03-01 — End: 2022-03-08
  Administered 2022-03-01 – 2022-03-08 (×15): via OPHTHALMIC

## 2022-03-01 MED ORDER — LISINOPRIL 20 MG TAB
20 mg | Freq: Every day | ORAL | Status: DC
Start: 2022-03-01 — End: 2022-03-08
  Administered 2022-03-01 – 2022-03-05 (×5): via ORAL

## 2022-03-01 MED ORDER — DIPHENHYDRAMINE 25 MG CAP
25 mg | Freq: Four times a day (QID) | ORAL | Status: DC | PRN
Start: 2022-03-01 — End: 2022-03-08
  Administered 2022-03-01 – 2022-03-03 (×2): via ORAL

## 2022-03-01 MED ORDER — METOPROLOL TARTRATE 25 MG TAB
25 mg | Freq: Two times a day (BID) | ORAL | Status: DC
Start: 2022-03-01 — End: 2022-02-28

## 2022-03-01 MED ORDER — BUDESONIDE 0.25 MG/2 ML NEB SUSPENSION
0.25 mg/2 mL | Freq: Two times a day (BID) | RESPIRATORY_TRACT | Status: DC
Start: 2022-03-01 — End: 2022-03-08
  Administered 2022-03-01 – 2022-03-08 (×16): via RESPIRATORY_TRACT

## 2022-03-01 MED ORDER — SODIUM CHLORIDE 0.9 % IV PIGGY BACK
1 gram | Freq: Two times a day (BID) | INTRAVENOUS | Status: DC
Start: 2022-03-01 — End: 2022-02-28
  Administered 2022-03-01: 02:00:00 via INTRAVENOUS

## 2022-03-01 MED ORDER — ONDANSETRON 4 MG TAB, RAPID DISSOLVE
4 mg | Freq: Three times a day (TID) | ORAL | Status: DC | PRN
Start: 2022-03-01 — End: 2022-03-08

## 2022-03-01 MED ORDER — POLYETHYLENE GLYCOL 3350 17 GRAM (100 %) ORAL POWDER PACKET
17 gram | Freq: Every day | ORAL | Status: DC | PRN
Start: 2022-03-01 — End: 2022-03-08

## 2022-03-01 MED ORDER — GLUCAGON 1 MG INJECTION
1 mg | INTRAMUSCULAR | Status: DC | PRN
Start: 2022-03-01 — End: 2022-03-08

## 2022-03-01 MED ORDER — TORSEMIDE 20 MG TAB
20 mg | Freq: Every day | ORAL | Status: DC
Start: 2022-03-01 — End: 2022-03-08
  Administered 2022-03-01 – 2022-03-05 (×5): via ORAL

## 2022-03-01 MED ORDER — PHARMACY VANCOMYCIN NOTE
Freq: Once | Status: AC
Start: 2022-03-01 — End: 2022-03-02
  Administered 2022-03-02: 10:00:00

## 2022-03-01 MED FILL — PHARMACY VANCOMYCIN NOTE: Qty: 1

## 2022-03-01 MED FILL — CHILDREN'S ASPIRIN 81 MG CHEWABLE TABLET: 81 mg | ORAL | Qty: 1

## 2022-03-01 MED FILL — LYRICA 100 MG CAPSULE: 100 mg | ORAL | Qty: 3

## 2022-03-01 MED FILL — ACETAMINOPHEN 325 MG TABLET: 325 mg | ORAL | Qty: 2

## 2022-03-01 MED FILL — BUDESONIDE 0.25 MG/2 ML NEB SUSPENSION: 0.25 mg/2 mL | RESPIRATORY_TRACT | Qty: 1

## 2022-03-01 MED FILL — MONOJECT PREFILL ADVANCED 0.9 % SODIUM CHLORIDE INJECTION SYRINGE: INTRAMUSCULAR | Qty: 40

## 2022-03-01 MED FILL — VANCOMYCIN 1.25 GRAM IV SOLUTION: 1.25 gram | INTRAVENOUS | Qty: 1250

## 2022-03-01 MED FILL — DIPHENHIST 25 MG CAPSULE: 25 mg | ORAL | Qty: 1

## 2022-03-01 MED FILL — LISINOPRIL 20 MG TAB: 20 mg | ORAL | Qty: 1

## 2022-03-01 MED FILL — CARVEDILOL 6.25 MG TAB: 6.25 mg | ORAL | Qty: 2

## 2022-03-01 MED FILL — CEFEPIME 2 GRAM SOLUTION FOR INJECTION: 2 gram | INTRAMUSCULAR | Qty: 2

## 2022-03-01 MED FILL — METOPROLOL TARTRATE 25 MG TAB: 25 mg | ORAL | Qty: 2

## 2022-03-01 MED FILL — ENOXAPARIN 40 MG/0.4 ML SUB-Q SYRINGE: 40 mg/0.4 mL | SUBCUTANEOUS | Qty: 0.4

## 2022-03-01 MED FILL — TORSEMIDE 20 MG TAB: 20 mg | ORAL | Qty: 1

## 2022-03-01 MED FILL — PANTOPRAZOLE 20 MG TAB, DELAYED RELEASE: 20 mg | ORAL | Qty: 1

## 2022-03-01 MED FILL — EYE ITCH RELIEF 0.025 % (0.035 %) DROPS: 0.025 % (0.035 %) | OPHTHALMIC | Qty: 5
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
## 2022-03-01 NOTE — Progress Notes (Signed)
 1740: Psych c/s was called and placed. Nurse answering service stated will relay consult to the on-call, Melissa Dibble.

## 2022-03-01 NOTE — Progress Notes (Signed)
Bedside shift change report given to Melissa (Cabin crew) by Alycia Rossetti (offgoing nurse). Report included the following information SBAR, Kardex, Intake/Output, and MAR.

## 2022-03-02 LAB — METABOLIC PANEL, BASIC
Anion gap: 7 mmol/L (ref 5–15)
BUN/Creatinine ratio: 21 — ABNORMAL HIGH (ref 12–20)
BUN: 19 mg/dL (ref 6–20)
CO2: 30 mmol/L (ref 21–32)
Calcium: 9.3 mg/dL (ref 8.5–10.1)
Chloride: 103 mmol/L (ref 97–108)
Creatinine: 0.91 mg/dL (ref 0.55–1.02)
Glucose: 182 mg/dL — ABNORMAL HIGH (ref 65–100)
Potassium: 4.1 mmol/L (ref 3.5–5.1)
Sodium: 140 mmol/L (ref 136–145)
eGFR: >60 mL/min/{1.73_m2} (ref >60)

## 2022-03-02 LAB — CBC WITH AUTOMATED DIFF
ABS. BASOPHILS: 0.1 10*3/uL (ref 0.0–0.1)
ABS. EOSINOPHILS: 0.4 10*3/uL (ref 0.0–0.4)
ABS. IMM. GRANS.: 0 10*3/uL (ref 0.00–0.04)
ABS. LYMPHOCYTES: 2.8 10*3/uL (ref 0.8–3.5)
ABS. MONOCYTES: 0.7 10*3/uL (ref 0.0–1.0)
ABS. NEUTROPHILS: 4 10*3/uL (ref 1.8–8.0)
ABSOLUTE NRBC: 0 10*3/uL (ref 0.00–0.01)
BASOPHILS: 1 % (ref 0–1)
EOSINOPHILS: 5 % (ref 0–7)
HCT: 32.4 % — ABNORMAL LOW (ref 35.0–47.0)
HGB: 10.2 g/dL — ABNORMAL LOW (ref 11.5–16.0)
IMMATURE GRANULOCYTES: 0 % (ref 0.0–0.5)
LYMPHOCYTES: 35 % (ref 12–49)
MCH: 27.6 PG (ref 26.0–34.0)
MCHC: 31.5 g/dL (ref 30.0–36.5)
MCV: 87.8 FL (ref 80.0–99.0)
MONOCYTES: 9 % (ref 5–13)
MPV: 11.2 FL (ref 8.9–12.9)
NEUTROPHILS: 50 % (ref 32–75)
NRBC: 0 PER 100 WBC
PLATELET: 323 10*3/uL (ref 150–400)
RBC: 3.69 M/uL — ABNORMAL LOW (ref 3.80–5.20)
RDW: 14.9 % — ABNORMAL HIGH (ref 11.5–14.5)
WBC: 8 10*3/uL (ref 3.6–11.0)

## 2022-03-02 LAB — GLUCOSE, POC
Glucose (POC): 178 mg/dL — ABNORMAL HIGH (ref 65–117)
Glucose (POC): 153 mg/dL — ABNORMAL HIGH (ref 65–117)
Glucose (POC): 152 mg/dL — ABNORMAL HIGH (ref 65–117)
Glucose (POC): 122 mg/dL — ABNORMAL HIGH (ref 65–117)

## 2022-03-02 LAB — VANCOMYCIN, RANDOM: Vancomycin, random: 10.6 UG/ML

## 2022-03-02 LAB — BASIC METABOLIC PANEL
Anion Gap: 7 mmol/L (ref 5–15)
BUN: 19 MG/DL (ref 6–20)
Bun/Cre Ratio: 21 — ABNORMAL HIGH (ref 12–20)
CO2: 30 mmol/L (ref 21–32)
Calcium: 9.3 MG/DL (ref 8.5–10.1)
Chloride: 103 mmol/L (ref 97–108)
Creatinine: 0.91 MG/DL (ref 0.55–1.02)
ESTIMATED GLOMERULAR FILTRATION RATE: >60 mL/min/{1.73_m2} (ref >60)
Glucose: 182 mg/dL — ABNORMAL HIGH (ref 65–100)
Potassium: 4.1 mmol/L (ref 3.5–5.1)
Sodium: 140 mmol/L (ref 136–145)

## 2022-03-02 LAB — POCT GLUCOSE
POC Glucose: 178 mg/dL — ABNORMAL HIGH (ref 65–117)
POC Glucose: 153 mg/dL — ABNORMAL HIGH (ref 65–117)
POC Glucose: 152 mg/dL — ABNORMAL HIGH (ref 65–117)
POC Glucose: 122 mg/dL — ABNORMAL HIGH (ref 65–117)

## 2022-03-02 LAB — CBC WITH AUTO DIFFERENTIAL
Basophils %: 1 % (ref 0–1)
Basophils Absolute: 0.1 10*3/uL (ref 0.0–0.1)
Eosinophils %: 5 % (ref 0–7)
Eosinophils Absolute: 0.4 10*3/uL (ref 0.0–0.4)
Granulocyte Absolute Count: 0 10*3/uL (ref 0.00–0.04)
Hematocrit: 32.4 % — ABNORMAL LOW (ref 35.0–47.0)
Hemoglobin: 10.2 g/dL — ABNORMAL LOW (ref 11.5–16.0)
Immature Granulocytes %: 0 % (ref 0.0–0.5)
Lymphocytes %: 35 % (ref 12–49)
Lymphocytes Absolute: 2.8 10*3/uL (ref 0.8–3.5)
MCH: 27.6 PG (ref 26.0–34.0)
MCHC: 31.5 g/dL (ref 30.0–36.5)
MCV: 87.8 FL (ref 80.0–99.0)
MPV: 11.2 FL (ref 8.9–12.9)
Monocytes %: 9 % (ref 5–13)
Monocytes Absolute: 0.7 10*3/uL (ref 0.0–1.0)
NRBC Absolute: 0 10*3/uL (ref 0.00–0.01)
Neutrophils %: 50 % (ref 32–75)
Neutrophils Absolute: 4 10*3/uL (ref 1.8–8.0)
Nucleated RBCs: 0 PER 100 WBC
Platelets: 323 10*3/uL (ref 150–400)
RBC: 3.69 M/uL — ABNORMAL LOW (ref 3.80–5.20)
RDW: 14.9 % — ABNORMAL HIGH (ref 11.5–14.5)
WBC: 8 10*3/uL (ref 3.6–11.0)

## 2022-03-02 LAB — VANCOMYCIN LEVEL, RANDOM: Vancomycin Rm: 10.6 UG/ML

## 2022-03-02 MED FILL — TORSEMIDE 20 MG TAB: 20 mg | ORAL | Qty: 1

## 2022-03-02 MED FILL — ENOXAPARIN 40 MG/0.4 ML SUB-Q SYRINGE: 40 mg/0.4 mL | SUBCUTANEOUS | Qty: 0.4

## 2022-03-02 MED FILL — CARVEDILOL 6.25 MG TAB: 6.25 mg | ORAL | Qty: 2

## 2022-03-02 MED FILL — INSULIN LISPRO 100 UNIT/ML INJECTION: 100 unit/mL | SUBCUTANEOUS | Qty: 1

## 2022-03-02 MED FILL — LYRICA 100 MG CAPSULE: 100 mg | ORAL | Qty: 3

## 2022-03-02 MED FILL — LISINOPRIL 20 MG TAB: 20 mg | ORAL | Qty: 1

## 2022-03-02 MED FILL — CEFEPIME 2 GRAM SOLUTION FOR INJECTION: 2 gram | INTRAMUSCULAR | Qty: 2

## 2022-03-02 MED FILL — PANTOPRAZOLE 20 MG TAB, DELAYED RELEASE: 20 mg | ORAL | Qty: 1

## 2022-03-02 MED FILL — CHILDREN'S ASPIRIN 81 MG CHEWABLE TABLET: 81 mg | ORAL | Qty: 1

## 2022-03-02 MED FILL — BUDESONIDE 0.25 MG/2 ML NEB SUSPENSION: 0.25 mg/2 mL | RESPIRATORY_TRACT | Qty: 1

## 2022-03-02 MED FILL — VANCOMYCIN 1.25 GRAM IV SOLUTION: 1.25 gram | INTRAVENOUS | Qty: 1250
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
## 2022-03-02 NOTE — Progress Notes (Signed)
Day #3 of Vancomycin - Level/Dosing Update  Indication:  Facial cellulitis  - CT of the maxillofacial shows right preorbital and periorbital soft tissue swelling and cellulitis without evidence of septal cellulitis or intracranial involvement  Current regimen:  1250 mg IV Q 18 hr  Abx regimen:  Cefepime + Vancomycin  ID Following ?: NO  Concomitant nephrotoxic drugs (requires more frequent monitoring): Contrast agents (given on 3/3)  Frequency of BMP?: Daily through 3/6    Recent Labs     03/02/22  0506 03/01/22  0051 02/28/22  1413   WBC 8.0 8.1 8.1   CREA 0.91 0.78 0.75   BUN 19 14 13      Est CrCl: ~55-60 ml/min; UO: -- ml/kg/hr  Temp (24hrs), Avg:97.9 F (36.6 C), Min:97.8 F (36.6 C), Max:98.1 F (36.7 C)    Cultures:   3/3 Blood: NGTD    MRSA Swab ordered (if applicable)? N/A    Goal target range AUC/MIC 400-500    Recent level history:  Date/Time Dose & Interval Measured Level (mcg/mL) Associated AUC/MIC Dose Adjustment    3/5 @ 0506 1250 mg IV Q18H 10.6 mcg/mL (drawn ~15 hrs post-dose) 440 No change                                         Plan: The random vancomycin level drawn this morning correlates with an AUC/MIC of 440, which is within the goal range.  Plan will be to continue the current dose.  Pharmacy will continue to monitor patient daily and will make dosage adjustments based upon changing renal function.    *Random vancomycin levels are used to calculate AUC/MIC, this level should not be interpreted as a trough. Vancomycin has been dosed using Bayesian kinetics software to target an AUC24:MIC of 400-600, which provides adequate exposure for as assumed infection due to MRSA with an MIC of 1 or less while reducing the risk of nephrotoxicity as seen with traditional trough based dosing goals.

## 2022-03-02 NOTE — Progress Notes (Signed)
 Bedside shift change report given to Desma (oncoming nurse) by Bernardino (offgoing nurse). Report included the following information SBAR, Kardex, Intake/Output, and MAR.

## 2022-03-03 LAB — METABOLIC PANEL, BASIC
Anion gap: 7 mmol/L (ref 5–15)
BUN/Creatinine ratio: 22 — ABNORMAL HIGH (ref 12–20)
BUN: 23 MG/DL — ABNORMAL HIGH (ref 6–20)
CO2: 30 mmol/L (ref 21–32)
Calcium: 8.7 mg/dL (ref 8.5–10.1)
Chloride: 100 mmol/L (ref 97–108)
Creatinine: 1.04 MG/DL — ABNORMAL HIGH (ref 0.55–1.02)
Glucose: 151 mg/dL — ABNORMAL HIGH (ref 65–100)
Potassium: 4.1 mmol/L (ref 3.5–5.1)
Sodium: 137 mmol/L (ref 136–145)
eGFR: 55 mL/min/{1.73_m2} — ABNORMAL LOW (ref >60)

## 2022-03-03 LAB — GLUCOSE, POC
Glucose (POC): 127 mg/dL — ABNORMAL HIGH (ref 65–117)
Glucose (POC): 170 mg/dL — ABNORMAL HIGH (ref 65–117)
Glucose (POC): 158 mg/dL — ABNORMAL HIGH (ref 65–117)
Glucose (POC): 151 mg/dL — ABNORMAL HIGH (ref 65–117)

## 2022-03-03 LAB — BASIC METABOLIC PANEL
Anion Gap: 7 mmol/L (ref 5–15)
BUN: 23 MG/DL — ABNORMAL HIGH (ref 6–20)
Bun/Cre Ratio: 22 — ABNORMAL HIGH (ref 12–20)
CO2: 30 mmol/L (ref 21–32)
Calcium: 8.7 MG/DL (ref 8.5–10.1)
Chloride: 100 mmol/L (ref 97–108)
Creatinine: 1.04 MG/DL — ABNORMAL HIGH (ref 0.55–1.02)
ESTIMATED GLOMERULAR FILTRATION RATE: 55 mL/min/{1.73_m2} — ABNORMAL LOW (ref >60)
Glucose: 151 mg/dL — ABNORMAL HIGH (ref 65–100)
Potassium: 4.1 mmol/L (ref 3.5–5.1)
Sodium: 137 mmol/L (ref 136–145)

## 2022-03-03 LAB — POCT GLUCOSE
POC Glucose: 127 mg/dL — ABNORMAL HIGH (ref 65–117)
POC Glucose: 170 mg/dL — ABNORMAL HIGH (ref 65–117)
POC Glucose: 158 mg/dL — ABNORMAL HIGH (ref 65–117)
POC Glucose: 151 mg/dL — ABNORMAL HIGH (ref 65–117)

## 2022-03-03 MED FILL — CARVEDILOL 6.25 MG TAB: 6.25 mg | ORAL | Qty: 2

## 2022-03-03 MED FILL — DIPHENHIST 25 MG CAPSULE: 25 mg | ORAL | Qty: 1

## 2022-03-03 MED FILL — CEFEPIME 2 GRAM SOLUTION FOR INJECTION: 2 gram | INTRAMUSCULAR | Qty: 2

## 2022-03-03 MED FILL — TORSEMIDE 20 MG TAB: 20 mg | ORAL | Qty: 1

## 2022-03-03 MED FILL — BUDESONIDE 0.25 MG/2 ML NEB SUSPENSION: 0.25 mg/2 mL | RESPIRATORY_TRACT | Qty: 1

## 2022-03-03 MED FILL — VANCOMYCIN 1.25 GRAM IV SOLUTION: 1.25 gram | INTRAVENOUS | Qty: 1250

## 2022-03-03 MED FILL — PANTOPRAZOLE 20 MG TAB, DELAYED RELEASE: 20 mg | ORAL | Qty: 1

## 2022-03-03 MED FILL — LISINOPRIL 20 MG TAB: 20 mg | ORAL | Qty: 1

## 2022-03-03 MED FILL — CHILDREN'S ASPIRIN 81 MG CHEWABLE TABLET: 81 mg | ORAL | Qty: 1

## 2022-03-03 MED FILL — INSULIN LISPRO 100 UNIT/ML INJECTION: 100 unit/mL | SUBCUTANEOUS | Qty: 1

## 2022-03-03 MED FILL — LYRICA 100 MG CAPSULE: 100 mg | ORAL | Qty: 3

## 2022-03-03 MED FILL — ENOXAPARIN 40 MG/0.4 ML SUB-Q SYRINGE: 40 mg/0.4 mL | SUBCUTANEOUS | Qty: 0.4

## 2022-03-03 NOTE — Progress Notes (Signed)
 2000: Bedside and Verbal shift change report given to Desma, RN (Cabin crew) by Oscar, RN (offgoing nurse). Report included the following information SBAR, Kardex, ED Summary, Intake/Output, MAR, and Recent Results.
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
## 2022-03-03 NOTE — Progress Notes (Signed)
Physician Progress Note      PATIENTCARREN, BLAKLEY  CSN #:                  [id number]  DOB:                       September 07, 1941  ADMIT DATE:       02/28/2022 3:37 PM  DISCH DATE:  RESPONDING  PROVIDER #:        Young Berry Ebone Alcivar NP          QUERY TEXT:    Patient admitted with Periorbital cellulitis.  Pt noted to have documented diabetes requiring sliding scale insulin. Please document in progress notes and discharge summary further specificity regarding the control status of DM:      The medical record reflects the following:  Risk Factors: DM  hyperglycemia    Clinical Indicators:  H&P 3/3  Diabetes type 2    POC glu 129  178  153  152  122  127  170      Treatment:  POC glucose monitoring, lispro sliding scale insulin    Thank you,  Bayard Hugger RN CDI [facility]  Clinical Documentation  [phone] or via [employer]  Options provided:  -- Hyperglycemia due to DM type II  -- Hyperglycemia not related to DM type II  -- Other - I will add my own diagnosis  -- Disagree - Not applicable / Not valid  -- Disagree - Clinically unable to determine / Unknown  -- Refer to Clinical Documentation Reviewer    PROVIDER RESPONSE TEXT:    This patient has Hyperglycemia due to DM type II.    Query created by: Bayard Hugger on 03/03/2022 11:22 AM      Electronically signed by:  Young Berry Aymen Widrig NP 03/03/2022 2:08 PM
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
## 2022-03-04 ENCOUNTER — Inpatient Hospital Stay: Admit: 2022-03-04 | Payer: MEDICARE

## 2022-03-04 LAB — GLUCOSE, POC
Glucose (POC): 139 mg/dL — ABNORMAL HIGH (ref 65–117)
Glucose (POC): 154 mg/dL — ABNORMAL HIGH (ref 65–117)
Glucose (POC): 263 mg/dL — ABNORMAL HIGH (ref 65–117)
Glucose (POC): 160 mg/dL — ABNORMAL HIGH (ref 65–117)
Glucose (POC): 183 mg/dL — ABNORMAL HIGH (ref 65–117)
Glucose (POC): 129 mg/dL — ABNORMAL HIGH (ref 65–117)
Glucose (POC): 150 mg/dL — ABNORMAL HIGH (ref 65–117)

## 2022-03-04 LAB — CBC WITH AUTOMATED DIFF
ABS. BASOPHILS: 0.1 10*3/uL (ref 0.0–0.1)
ABS. EOSINOPHILS: 0.3 10*3/uL (ref 0.0–0.4)
ABS. IMM. GRANS.: 0 10*3/uL (ref 0.00–0.04)
ABS. LYMPHOCYTES: 2.5 10*3/uL (ref 0.8–3.5)
ABS. MONOCYTES: 0.7 10*3/uL (ref 0.0–1.0)
ABS. NEUTROPHILS: 3.6 10*3/uL (ref 1.8–8.0)
ABSOLUTE NRBC: 0 10*3/uL (ref 0.00–0.01)
BASOPHILS: 1 % (ref 0–1)
EOSINOPHILS: 4 % (ref 0–7)
HCT: 37.6 % (ref 35.0–47.0)
HGB: 11.3 g/dL — ABNORMAL LOW (ref 11.5–16.0)
IMMATURE GRANULOCYTES: 0 % (ref 0.0–0.5)
LYMPHOCYTES: 35 % (ref 12–49)
MCH: 27.4 PG (ref 26.0–34.0)
MCHC: 30.1 g/dL (ref 30.0–36.5)
MCV: 91.3 FL (ref 80.0–99.0)
MONOCYTES: 10 % (ref 5–13)
MPV: 11.3 FL (ref 8.9–12.9)
NEUTROPHILS: 50 % (ref 32–75)
NRBC: 0 PER 100 WBC
PLATELET: 268 10*3/uL (ref 150–400)
RBC: 4.12 M/uL (ref 3.80–5.20)
RDW: 14.6 % — ABNORMAL HIGH (ref 11.5–14.5)
WBC: 7.1 10*3/uL (ref 3.6–11.0)

## 2022-03-04 LAB — METABOLIC PANEL, COMPREHENSIVE
A-G Ratio: 0.9 — ABNORMAL LOW (ref 1.1–2.2)
ALT (SGPT): 39 U/L (ref 12–78)
AST (SGOT): 20 U/L (ref 15–37)
Albumin: 3.1 g/dL — ABNORMAL LOW (ref 3.5–5.0)
Alk. phosphatase: 74 U/L (ref 45–117)
Anion gap: 5 mmol/L (ref 5–15)
BUN/Creatinine ratio: 27 — ABNORMAL HIGH (ref 12–20)
BUN: 33 MG/DL — ABNORMAL HIGH (ref 6–20)
Bilirubin, total: 0.3 MG/DL (ref 0.2–1.0)
CO2: 25 mmol/L (ref 21–32)
Calcium: 9.3 MG/DL (ref 8.5–10.1)
Chloride: 102 mmol/L (ref 97–108)
Creatinine: 1.21 MG/DL — ABNORMAL HIGH (ref 0.55–1.02)
Globulin: 3.3 g/dL (ref 2.0–4.0)
Glucose: 171 mg/dL — ABNORMAL HIGH (ref 65–100)
Potassium: 4.2 mmol/L (ref 3.5–5.1)
Protein, total: 6.4 g/dL (ref 6.4–8.2)
Sodium: 132 mmol/L — ABNORMAL LOW (ref 136–145)
eGFR: 46 mL/min/{1.73_m2} — ABNORMAL LOW (ref >60)

## 2022-03-04 LAB — SAMPLES BEING HELD

## 2022-03-04 LAB — PHOSPHORUS
Phosphorus: 4.1 MG/DL (ref 2.6–4.7)
Phosphorus: 4.1 MG/DL (ref 2.6–4.7)

## 2022-03-04 LAB — MAGNESIUM
Magnesium: 2 mg/dL (ref 1.6–2.4)
Magnesium: 2 mg/dL (ref 1.6–2.4)

## 2022-03-04 LAB — COMPREHENSIVE METABOLIC PANEL
ALT: 39 U/L (ref 12–78)
AST: 20 U/L (ref 15–37)
Albumin/Globulin Ratio: 0.9 — ABNORMAL LOW (ref 1.1–2.2)
Albumin: 3.1 g/dL — ABNORMAL LOW (ref 3.5–5.0)
Alkaline Phosphatase: 74 U/L (ref 45–117)
Anion Gap: 5 mmol/L (ref 5–15)
BUN/Creatinine Ratio: 27 — ABNORMAL HIGH (ref 12–20)
BUN: 33 MG/DL — ABNORMAL HIGH (ref 6–20)
CO2: 25 mmol/L (ref 21–32)
Calcium: 9.3 MG/DL (ref 8.5–10.1)
Chloride: 102 mmol/L (ref 97–108)
Creatinine: 1.21 MG/DL — ABNORMAL HIGH (ref 0.55–1.02)
Est, Glom Filt Rate: 46 mL/min/{1.73_m2} — ABNORMAL LOW (ref >60)
Globulin: 3.3 g/dL (ref 2.0–4.0)
Glucose: 171 mg/dL — ABNORMAL HIGH (ref 65–100)
Potassium: 4.2 mmol/L (ref 3.5–5.1)
Sodium: 132 mmol/L — ABNORMAL LOW (ref 136–145)
Total Bilirubin: 0.3 MG/DL (ref 0.2–1.0)
Total Protein: 6.4 g/dL (ref 6.4–8.2)

## 2022-03-04 LAB — CBC WITH AUTO DIFFERENTIAL
Basophils %: 1 % (ref 0–1)
Basophils Absolute: 0.1 10*3/uL (ref 0.0–0.1)
Eosinophils %: 4 % (ref 0–7)
Eosinophils Absolute: 0.3 10*3/uL (ref 0.0–0.4)
Granulocyte Absolute Count: 0 10*3/uL (ref 0.00–0.04)
Hematocrit: 37.6 % (ref 35.0–47.0)
Hemoglobin: 11.3 g/dL — ABNORMAL LOW (ref 11.5–16.0)
Immature Granulocytes %: 0 % (ref 0.0–0.5)
Lymphocytes %: 35 % (ref 12–49)
Lymphocytes Absolute: 2.5 10*3/uL (ref 0.8–3.5)
MCH: 27.4 PG (ref 26.0–34.0)
MCHC: 30.1 g/dL (ref 30.0–36.5)
MCV: 91.3 FL (ref 80.0–99.0)
MPV: 11.3 FL (ref 8.9–12.9)
Monocytes %: 10 % (ref 5–13)
Monocytes Absolute: 0.7 10*3/uL (ref 0.0–1.0)
NRBC Absolute: 0 10*3/uL (ref 0.00–0.01)
Neutrophils %: 50 % (ref 32–75)
Neutrophils Absolute: 3.6 10*3/uL (ref 1.8–8.0)
Nucleated RBCs: 0 PER 100 WBC
Platelets: 268 10*3/uL (ref 150–400)
RBC: 4.12 M/uL (ref 3.80–5.20)
RDW: 14.6 % — ABNORMAL HIGH (ref 11.5–14.5)
WBC: 7.1 10*3/uL (ref 3.6–11.0)

## 2022-03-04 LAB — POCT GLUCOSE
POC Glucose: 139 mg/dL — ABNORMAL HIGH (ref 65–117)
POC Glucose: 154 mg/dL — ABNORMAL HIGH (ref 65–117)
POC Glucose: 263 mg/dL — ABNORMAL HIGH (ref 65–117)
POC Glucose: 160 mg/dL — ABNORMAL HIGH (ref 65–117)
POC Glucose: 183 mg/dL — ABNORMAL HIGH (ref 65–117)
POC Glucose: 129 mg/dL — ABNORMAL HIGH (ref 65–117)
POC Glucose: 150 mg/dL — ABNORMAL HIGH (ref 65–117)

## 2022-03-04 MED ORDER — IOPAMIDOL 76 % IV SOLN
370 mg iodine /mL (76 %) | Freq: Once | INTRAVENOUS | Status: AC
Start: 2022-03-04 — End: 2022-03-04

## 2022-03-04 MED ORDER — IOPAMIDOL 76 % IV SOLN
370 mg iodine /mL (76 %) | INTRAVENOUS | Status: AC
Start: 2022-03-04 — End: 2022-03-04
  Administered 2022-03-04: 15:00:00 via INTRAVENOUS

## 2022-03-04 MED ORDER — ALOGLIPTIN 6.25 MG TABLET
6.25 mg | Freq: Every day | ORAL | Status: DC
Start: 2022-03-04 — End: 2022-03-06
  Administered 2022-03-04 – 2022-03-06 (×3): via ORAL

## 2022-03-04 MED ORDER — LEVETIRACETAM 500 MG TAB
500 mg | Freq: Two times a day (BID) | ORAL | Status: DC
Start: 2022-03-04 — End: 2022-03-08
  Administered 2022-03-05 – 2022-03-08 (×7): via ORAL

## 2022-03-04 MED ORDER — QUETIAPINE 25 MG TAB
25 mg | Freq: Every evening | ORAL | Status: DC
Start: 2022-03-04 — End: 2022-03-08
  Administered 2022-03-04 – 2022-03-08 (×4): via ORAL

## 2022-03-04 MED ORDER — CLINDAMYCIN 150 MG CAP
150 mg | Freq: Three times a day (TID) | ORAL | Status: DC
Start: 2022-03-04 — End: 2022-03-08
  Administered 2022-03-04 – 2022-03-08 (×12): via ORAL

## 2022-03-04 MED ORDER — IOPAMIDOL 76 % IV SOLN
370 mg iodine /mL (76 %) | Freq: Once | INTRAVENOUS | Status: AC
Start: 2022-03-04 — End: 2022-03-04
  Administered 2022-03-04: 15:00:00 via INTRAVENOUS

## 2022-03-04 MED ORDER — SODIUM CHLORIDE 0.9 % IV
INTRAVENOUS | Status: DC
Start: 2022-03-04 — End: 2022-03-08
  Administered 2022-03-04 – 2022-03-08 (×7): via INTRAVENOUS

## 2022-03-04 MED ORDER — LEVETIRACETAM 500 MG/5 ML IV SOLN
500 mg/5 mL | Freq: Once | INTRAVENOUS | Status: AC
Start: 2022-03-04 — End: 2022-03-04
  Administered 2022-03-04: 20:00:00 via INTRAVENOUS

## 2022-03-04 MED FILL — INSULIN LISPRO 100 UNIT/ML INJECTION: 100 unit/mL | SUBCUTANEOUS | Qty: 1

## 2022-03-04 MED FILL — CHILDREN'S ASPIRIN 81 MG CHEWABLE TABLET: 81 mg | ORAL | Qty: 1

## 2022-03-04 MED FILL — CEFEPIME 2 GRAM SOLUTION FOR INJECTION: 2 gram | INTRAMUSCULAR | Qty: 2

## 2022-03-04 MED FILL — ISOVUE-370  76 % INTRAVENOUS SOLUTION: 370 mg iodine /mL (76 %) | INTRAVENOUS | Qty: 100

## 2022-03-04 MED FILL — CLINDAMYCIN 150 MG CAP: 150 mg | ORAL | Qty: 4

## 2022-03-04 MED FILL — LYRICA 100 MG CAPSULE: 100 mg | ORAL | Qty: 3

## 2022-03-04 MED FILL — CARVEDILOL 6.25 MG TAB: 6.25 mg | ORAL | Qty: 2

## 2022-03-04 MED FILL — ENOXAPARIN 40 MG/0.4 ML SUB-Q SYRINGE: 40 mg/0.4 mL | SUBCUTANEOUS | Qty: 0.4

## 2022-03-04 MED FILL — SODIUM CHLORIDE 0.9 % IV: INTRAVENOUS | Qty: 1000

## 2022-03-04 MED FILL — LISINOPRIL 20 MG TAB: 20 mg | ORAL | Qty: 1

## 2022-03-04 MED FILL — LEVETIRACETAM 500 MG/5 ML IV SOLN: 500 mg/5 mL | INTRAVENOUS | Qty: 10

## 2022-03-04 MED FILL — BUDESONIDE 0.25 MG/2 ML NEB SUSPENSION: 0.25 mg/2 mL | RESPIRATORY_TRACT | Qty: 1

## 2022-03-04 MED FILL — TORSEMIDE 20 MG TAB: 20 mg | ORAL | Qty: 1

## 2022-03-04 MED FILL — QUETIAPINE 25 MG TAB: 25 mg | ORAL | Qty: 1

## 2022-03-04 MED FILL — ALOGLIPTIN 6.25 MG TABLET: 6.25 mg | ORAL | Qty: 2

## 2022-03-04 MED FILL — PANTOPRAZOLE 20 MG TAB, DELAYED RELEASE: 20 mg | ORAL | Qty: 1
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
## 2022-03-04 NOTE — Progress Notes (Signed)
Called to room because resp therapist report she witnessed pt having what appeared to be a seizure like activity, facial  twitching, left shoulder twitching, eyes closed, pt was speaking but became non responsive  momentarily but with immediately resumed speaking and was aware of episode that occurred.  Rapid response called and pt was sent for CT of head, tele-neuro evaluation done.     1500 -  second rapid response called as this nurse in room with pt to administer IV  Keppra and witnessed two episodes of pt becoming non-responsive, stopped talking with muscle twitching of left  face and shoulder. Pt had just been reporting that she has a new tremor to her let hand that is making it difficult for her to write and use her left hand as this is her dominant hand.  Tremor not noted on assessment. Screen for  MRI completed. Morrie Sheldon is aware that pt reports needing to be medicated fror MRI as she is claustrophobic.     1900 -  bedside EEG completed, no further episodes noted. Pt aware to not get up on her own and has complied with that request.     2030  -  Bedside and Verbal shift change report given to Desma, RN  (oncoming nurse) by Byrd Hesselbach (offgoing nurse). Report included the following information SBAR and Kardex.

## 2022-03-04 NOTE — Progress Notes (Signed)
Progress Notes by Sakshi Sermons, Valentina Shaggy, NP at 03/04/22 0945                Author: Anelis Hrivnak, Valentina Shaggy, NP  Service: RADIOLOGY  Author Type: Nurse Practitioner       Filed: 03/04/22 1008  Date of Service: 03/04/22 0945  Status: Signed          Editor: Kerstyn Coryell, Valentina Shaggy, NP (Nurse Practitioner)                    Neurocritical Care Code Stroke Documentation           Symptoms:   Pt with 2 witnessed seizure like episodes while with respiratory therapy, on cefepime. Received seroquel last night for the first time.         Baseline mRS:         Last Known Well:   0925        Medical hx:  Past Medical History:      Diagnosis  Date      ?  Asthma        ?  Diabetes (HCC)        ?  Fibromyalgia        ?  Hyperlipemia        ?  Hypertension        ?  Psychotic disorder (HCC)        ?  Stroke [facility])           Recently diagnosed with dementia        Anticoagulation:    None     VAN:    Negative     NIHSS:    1a-LOC:0     1b-Month/Age:6     1c-Open/Close Hand:0     2-Best Gaze:0     3-Visual Fields:0     4-Facial Palsy:0     5a-Left Arm:0     5b-Right Arm:0     6a-Left Leg:0     6b-Right Leg:0     7-Limb Ataxia:0     8-Sensory:0     9-Best Language:0     10-Dysarthria:0     11-Extinction/Inattention:0   TOTAL SCORE:0     Imaging:    CT head negative for acute process     CTA no LVO        Plan:    TNK Candidate: NO     Mechanical thrombectomy Candidate: NO        *Perform dysphagia screening prior to any PO intake*      Discussed with: NP Dowdy      Arrival time: 0930   Time spent: 35 minutes.       Valentina Shaggy Nissim Fleischer, NP   Neurocritical Care Nurse Practitioner   [phone]

## 2022-03-04 NOTE — Procedures (Signed)
PROCEDURE: ROUTINE INPATIENT EEG  NAME:   Jamie Bryant  ACCOUNT NUMBER : [id number]  MRN:   [id number]  DATE OF SERVICE: 03/04/22    HISTORY/INDICATION: Patient is a 81 year old female with psychosis with an episode of syncope associated with face and arm twitching suspected post syncopal seizure versus involuntary movements due to cefepime.  EEG is ordered by the hospitalist NP to evaluate for underlying epilepsy.    MEDICATIONS:   Current Facility-Administered Medications   Medication Dose Route Frequency Provider Last Rate Last Admin    clindamycin (CLEOCIN) capsule 600 mg  600 mg Oral Q8H Dowdy, Margaret A, NP   600 mg at 03/05/22 5366    levETIRAcetam (KEPPRA) tablet 500 mg  500 mg Oral BID Clearnce Hasten A, NP   500 mg at 03/05/22 0945    0.9% sodium chloride infusion  50 mL/hr IntraVENous CONTINUOUS Cato Mulligan, NP 50 mL/hr at 03/04/22 1521 50 mL/hr at 03/04/22 1521    alogliptin (NESINA) tablet 12.5 mg  12.5 mg Oral DAILY Cato Mulligan, NP   12.5 mg at 03/05/22 4403    QUEtiapine (SEROquel) tablet 25 mg  25 mg Oral QHS Clearnce Hasten A, NP   25 mg at 03/03/22 2125    diphenhydrAMINE (BENADRYL) capsule 25 mg  25 mg Oral Q6H PRN Raynelle Highland, NP   25 mg at 03/02/22 2139    pregabalin (LYRICA) capsule 300 mg  300 mg Oral BID Clearnce Hasten A, NP   300 mg at 03/05/22 0945    insulin lispro (HUMALOG) injection   SubCUTAneous AC&HS Cato Mulligan, NP   2 Units at 03/05/22 4742    glucose chewable tablet 16 g  4 Tablet Oral PRN Cato Mulligan, NP        glucagon (GLUCAGEN) injection 1 mg  1 mg IntraMUSCular PRN Cato Mulligan, NP        dextrose 10 % infusion 0-250 mL  0-250 mL IntraVENous PRN Cato Mulligan, NP        aspirin chewable tablet 81 mg  81 mg Oral DAILY Zaw, Kyaw T, MD   81 mg at 03/05/22 0945    ketotifen (ZADITOR) 0.025 % (0.035 %) ophthalmic solution 1 Drop  1 Drop Both Eyes BID Zaw, Kyaw T, MD   1 Drop at 03/05/22 0958    pantoprazole (PROTONIX) tablet 20 mg  20 mg Oral  ACB Zaw, Kyaw T, MD   20 mg at 03/05/22 0722    lisinopriL (PRINIVIL, ZESTRIL) tablet 20 mg  20 mg Oral DAILY Zaw, Kyaw T, MD   20 mg at 03/05/22 0945    torsemide (DEMADEX) tablet 20 mg  20 mg Oral DAILY Zaw, Kyaw T, MD   20 mg at 03/05/22 0945    sodium chloride (NS) flush 5-40 mL  5-40 mL IntraVENous Q8H Zaw, Kyaw T, MD   10 mL at 03/05/22 5956    sodium chloride (NS) flush 5-40 mL  5-40 mL IntraVENous PRN Zaw, Kyaw T, MD        acetaminophen (TYLENOL) tablet 650 mg  650 mg Oral Q6H PRN Zaw, Kyaw T, MD   650 mg at 03/01/22 0117    Or    acetaminophen (TYLENOL) suppository 650 mg  650 mg Rectal Q6H PRN Zaw, Kyaw T, MD        polyethylene glycol (MIRALAX) packet 17 g  17 g Oral DAILY PRN Zaw, Dellis Anes, MD  ondansetron (ZOFRAN ODT) tablet 4 mg  4 mg Oral Q8H PRN Zaw, Kyaw T, MD        Or    ondansetron (ZOFRAN) injection 4 mg  4 mg IntraVENous Q6H PRN Zaw, Kyaw T, MD        enoxaparin (LOVENOX) injection 40 mg  40 mg SubCUTAneous DAILY Zaw, Kyaw T, MD   40 mg at 03/05/22 0945    budesonide (PULMICORT) 250 mcg/39ml nebulizer susp  250 mcg Nebulization BID RT Debeb, Wondaya T, MD   250 mcg at 03/05/22 0810    carvediloL (COREG) tablet 12.5 mg  12.5 mg Oral BID WITH MEALS Wolcott, Sarah A, NP   12.5 mg at 03/05/22 8563       CONDITIONS OF RECORDING: This is a routine 21-channel EEG recording performed in accordance with the international 10-20 system with one channel devoted to limited EKG. This study was done during states of wakefulness and sleep. Photic stimulation was performed as an activating procedure.       DESCRIPTION:   Upon maximal arousal the posterior dominant rhythm has a frequency of 9Hz  with an amplitude of 20uV. This activity is symmetric over the bilateral posterior derivations and attenuates with eye opening. Photic stimulation did not significantly alter the tracing.  Normal sleep architecture is seen with stage I sleep recognized by the presence of symmetric vertex waves. There are no focal  abnormalities, epileptiform discharges, or electrographic seizures seen.     INTERPRETATION: Normal awake and asleep EEG    CLINICAL CORRELATION: A normal EEG does not definitively exclude a diagnosis of epilepsy if clinical suspicion is high consider sleep deprived EEG.     , MD

## 2022-03-05 ENCOUNTER — Inpatient Hospital Stay: Payer: MEDICARE

## 2022-03-05 LAB — GLUCOSE, POC
Glucose (POC): 173 mg/dL — ABNORMAL HIGH (ref 65–117)
Glucose (POC): 157 mg/dL — ABNORMAL HIGH (ref 65–117)
Glucose (POC): 150 mg/dL — ABNORMAL HIGH (ref 65–117)
Glucose (POC): 174 mg/dL — ABNORMAL HIGH (ref 65–117)

## 2022-03-05 LAB — METABOLIC PANEL, COMPREHENSIVE
A-G Ratio: 1 — ABNORMAL LOW (ref 1.1–2.2)
ALT (SGPT): 36 U/L (ref 12–78)
AST (SGOT): 18 U/L (ref 15–37)
Albumin: 3.5 g/dL (ref 3.5–5.0)
Alk. phosphatase: 86 U/L (ref 45–117)
Anion gap: 4 mmol/L — ABNORMAL LOW (ref 5–15)
BUN/Creatinine ratio: 26 — ABNORMAL HIGH (ref 12–20)
BUN: 41 MG/DL — ABNORMAL HIGH (ref 6–20)
Bilirubin, total: 0.3 MG/DL (ref 0.2–1.0)
CO2: 33 mmol/L — ABNORMAL HIGH (ref 21–32)
Calcium: 9.9 MG/DL (ref 8.5–10.1)
Chloride: 99 mmol/L (ref 97–108)
Creatinine: 1.6 MG/DL — ABNORMAL HIGH (ref 0.55–1.02)
Globulin: 3.4 g/dL (ref 2.0–4.0)
Glucose: 161 mg/dL — ABNORMAL HIGH (ref 65–100)
Potassium: 4.6 mmol/L (ref 3.5–5.1)
Protein, total: 6.9 g/dL (ref 6.4–8.2)
Sodium: 136 mmol/L (ref 136–145)
eGFR: 33 mL/min/{1.73_m2} — ABNORMAL LOW (ref >60)

## 2022-03-05 LAB — CBC WITH AUTOMATED DIFF
ABS. BASOPHILS: 0.1 10*3/uL (ref 0.0–0.1)
ABS. EOSINOPHILS: 0.3 10*3/uL (ref 0.0–0.4)
ABS. IMM. GRANS.: 0 10*3/uL (ref 0.00–0.04)
ABS. LYMPHOCYTES: 1.7 10*3/uL (ref 0.8–3.5)
ABS. MONOCYTES: 0.5 10*3/uL (ref 0.0–1.0)
ABS. NEUTROPHILS: 4.3 10*3/uL (ref 1.8–8.0)
ABSOLUTE NRBC: 0 10*3/uL (ref 0.00–0.01)
BASOPHILS: 1 % (ref 0–1)
EOSINOPHILS: 5 % (ref 0–7)
HCT: 36.8 % (ref 35.0–47.0)
HGB: 11.8 g/dL (ref 11.5–16.0)
IMMATURE GRANULOCYTES: 0 % (ref 0.0–0.5)
LYMPHOCYTES: 24 % (ref 12–49)
MCH: 28 PG (ref 26.0–34.0)
MCHC: 32.1 g/dL (ref 30.0–36.5)
MCV: 87.4 FL (ref 80.0–99.0)
MONOCYTES: 8 % (ref 5–13)
MPV: 11.5 FL (ref 8.9–12.9)
NEUTROPHILS: 62 % (ref 32–75)
NRBC: 0 PER 100 WBC
PLATELET: 326 10*3/uL (ref 150–400)
RBC: 4.21 M/uL (ref 3.80–5.20)
RDW: 14.3 % (ref 11.5–14.5)
WBC: 6.9 10*3/uL (ref 3.6–11.0)

## 2022-03-05 LAB — CBC WITH AUTO DIFFERENTIAL
Basophils %: 1 % (ref 0–1)
Basophils Absolute: 0.1 10*3/uL (ref 0.0–0.1)
Eosinophils %: 5 % (ref 0–7)
Eosinophils Absolute: 0.3 10*3/uL (ref 0.0–0.4)
Granulocyte Absolute Count: 0 10*3/uL (ref 0.00–0.04)
Hematocrit: 36.8 % (ref 35.0–47.0)
Hemoglobin: 11.8 g/dL (ref 11.5–16.0)
Immature Granulocytes %: 0 % (ref 0.0–0.5)
Lymphocytes %: 24 % (ref 12–49)
Lymphocytes Absolute: 1.7 10*3/uL (ref 0.8–3.5)
MCH: 28 PG (ref 26.0–34.0)
MCHC: 32.1 g/dL (ref 30.0–36.5)
MCV: 87.4 FL (ref 80.0–99.0)
MPV: 11.5 FL (ref 8.9–12.9)
Monocytes %: 8 % (ref 5–13)
Monocytes Absolute: 0.5 10*3/uL (ref 0.0–1.0)
NRBC Absolute: 0 10*3/uL (ref 0.00–0.01)
Neutrophils %: 62 % (ref 32–75)
Neutrophils Absolute: 4.3 10*3/uL (ref 1.8–8.0)
Nucleated RBCs: 0 PER 100 WBC
Platelets: 326 10*3/uL (ref 150–400)
RBC: 4.21 M/uL (ref 3.80–5.20)
RDW: 14.3 % (ref 11.5–14.5)
WBC: 6.9 10*3/uL (ref 3.6–11.0)

## 2022-03-05 LAB — POCT GLUCOSE
POC Glucose: 173 mg/dL — ABNORMAL HIGH (ref 65–117)
POC Glucose: 157 mg/dL — ABNORMAL HIGH (ref 65–117)
POC Glucose: 150 mg/dL — ABNORMAL HIGH (ref 65–117)
POC Glucose: 174 mg/dL — ABNORMAL HIGH (ref 65–117)

## 2022-03-05 LAB — COMPREHENSIVE METABOLIC PANEL
ALT: 36 U/L (ref 12–78)
AST: 18 U/L (ref 15–37)
Albumin/Globulin Ratio: 1 — ABNORMAL LOW (ref 1.1–2.2)
Albumin: 3.5 g/dL (ref 3.5–5.0)
Alkaline Phosphatase: 86 U/L (ref 45–117)
Anion Gap: 4 mmol/L — ABNORMAL LOW (ref 5–15)
BUN/Creatinine Ratio: 26 — ABNORMAL HIGH (ref 12–20)
BUN: 41 MG/DL — ABNORMAL HIGH (ref 6–20)
CO2: 33 mmol/L — ABNORMAL HIGH (ref 21–32)
Calcium: 9.9 MG/DL (ref 8.5–10.1)
Chloride: 99 mmol/L (ref 97–108)
Creatinine: 1.6 MG/DL — ABNORMAL HIGH (ref 0.55–1.02)
Est, Glom Filt Rate: 33 mL/min/{1.73_m2} — ABNORMAL LOW (ref >60)
Globulin: 3.4 g/dL (ref 2.0–4.0)
Glucose: 161 mg/dL — ABNORMAL HIGH (ref 65–100)
Potassium: 4.6 mmol/L (ref 3.5–5.1)
Sodium: 136 mmol/L (ref 136–145)
Total Bilirubin: 0.3 MG/DL (ref 0.2–1.0)
Total Protein: 6.9 g/dL (ref 6.4–8.2)

## 2022-03-05 MED ORDER — LORAZEPAM 2 MG/ML SYRINGE
2 mg/mL | Freq: Once | INTRAMUSCULAR | Status: AC
Start: 2022-03-05 — End: 2022-03-05
  Administered 2022-03-05: via INTRAVENOUS

## 2022-03-05 MED ORDER — LORAZEPAM 2 MG/ML SYRINGE
2 mg/mL | Freq: Once | INTRAMUSCULAR | Status: DC
Start: 2022-03-05 — End: 2022-03-05

## 2022-03-05 MED ORDER — LORAZEPAM 1 MG TAB
1 mg | Freq: Once | ORAL | Status: AC
Start: 2022-03-05 — End: 2022-03-05
  Administered 2022-03-05: 06:00:00 via ORAL

## 2022-03-05 MED ORDER — GADOTERIDOL 279.3 MG/ML INTRAVENOUS SOLUTION
279.3 mg/mL | Freq: Once | INTRAVENOUS | Status: AC
Start: 2022-03-05 — End: 2022-03-06

## 2022-03-05 MED ORDER — SODIUM CHLORIDE 0.9 % IJ SYRG
Freq: Once | INTRAMUSCULAR | Status: AC
Start: 2022-03-05 — End: 2022-03-06

## 2022-03-05 MED ORDER — GADOTERIDOL 279.3 MG/ML INTRAVENOUS SOLUTION
279.3 mg/mL | INTRAVENOUS | Status: DC
Start: 2022-03-05 — End: 2022-03-05

## 2022-03-05 MED FILL — INSULIN LISPRO 100 UNIT/ML INJECTION: 100 unit/mL | SUBCUTANEOUS | Qty: 1

## 2022-03-05 MED FILL — ALOGLIPTIN 6.25 MG TABLET: 6.25 mg | ORAL | Qty: 2

## 2022-03-05 MED FILL — PROHANCE 279.3 MG/ML INTRAVENOUS SOLUTION: 279.3 mg/mL | INTRAVENOUS | Qty: 20

## 2022-03-05 MED FILL — LEVETIRACETAM 500 MG TAB: 500 mg | ORAL | Qty: 1

## 2022-03-05 MED FILL — BUDESONIDE 0.25 MG/2 ML NEB SUSPENSION: 0.25 mg/2 mL | RESPIRATORY_TRACT | Qty: 1

## 2022-03-05 MED FILL — LISINOPRIL 20 MG TAB: 20 mg | ORAL | Qty: 1

## 2022-03-05 MED FILL — CLINDAMYCIN 150 MG CAP: 150 mg | ORAL | Qty: 4

## 2022-03-05 MED FILL — ENOXAPARIN 40 MG/0.4 ML SUB-Q SYRINGE: 40 mg/0.4 mL | SUBCUTANEOUS | Qty: 0.4

## 2022-03-05 MED FILL — QUETIAPINE 25 MG TAB: 25 mg | ORAL | Qty: 1

## 2022-03-05 MED FILL — LYRICA 100 MG CAPSULE: 100 mg | ORAL | Qty: 3

## 2022-03-05 MED FILL — LORAZEPAM 1 MG TAB: 1 mg | ORAL | Qty: 1

## 2022-03-05 MED FILL — TORSEMIDE 20 MG TAB: 20 mg | ORAL | Qty: 1

## 2022-03-05 MED FILL — CHILDREN'S ASPIRIN 81 MG CHEWABLE TABLET: 81 mg | ORAL | Qty: 1

## 2022-03-05 MED FILL — ONDANSETRON (PF) 4 MG/2 ML INJECTION: 4 mg/2 mL | INTRAMUSCULAR | Qty: 2

## 2022-03-05 MED FILL — PANTOPRAZOLE 20 MG TAB, DELAYED RELEASE: 20 mg | ORAL | Qty: 1

## 2022-03-05 MED FILL — CARVEDILOL 6.25 MG TAB: 6.25 mg | ORAL | Qty: 2

## 2022-03-05 MED FILL — LORAZEPAM 2 MG/ML SYRINGE: 2 mg/mL | INTRAMUSCULAR | Qty: 1

## 2022-03-05 NOTE — Progress Notes (Signed)
 Bedside and Verbal shift change report given to Kayla (Cabin crew) by Stacia (offgoing nurse). Report included the following information SBAR, Kardex, OR Summary, Intake/Output, MAR, and Recent Results.
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
## 2022-03-05 NOTE — Progress Notes (Signed)
Patient went down for MRI and since she's claustrophobic, PO ativan was given prior. Once patient arrived to MRI, tech called stating that the patient informed her that she has to be "totally out" in order for MRI to be completed. NP notified.

## 2022-03-06 ENCOUNTER — Inpatient Hospital Stay: Admit: 2022-03-06 | Payer: MEDICARE

## 2022-03-06 ENCOUNTER — Inpatient Hospital Stay: Payer: MEDICARE

## 2022-03-06 LAB — CBC WITH AUTOMATED DIFF
ABS. BASOPHILS: 0.1 10*3/uL (ref 0.0–0.1)
ABS. EOSINOPHILS: 0.3 10*3/uL (ref 0.0–0.4)
ABS. IMM. GRANS.: 0 10*3/uL
ABS. LYMPHOCYTES: 2.1 10*3/uL (ref 0.8–3.5)
ABS. MONOCYTES: 0.8 10*3/uL (ref 0.0–1.0)
ABS. NEUTROPHILS: 3.2 10*3/uL (ref 1.8–8.0)
ABSOLUTE NRBC: 0 10*3/uL (ref 0.00–0.01)
BAND NEUTROPHILS: 1 % (ref 0–6)
BASOPHILS: 2 % — ABNORMAL HIGH (ref 0–1)
EOSINOPHILS: 5 % (ref 0–7)
HCT: 32.9 % — ABNORMAL LOW (ref 35.0–47.0)
HGB: 10.4 g/dL — ABNORMAL LOW (ref 11.5–16.0)
IMMATURE GRANULOCYTES: 0 %
LYMPHOCYTES: 32 % (ref 12–49)
MCH: 27.7 PG (ref 26.0–34.0)
MCHC: 31.6 g/dL (ref 30.0–36.5)
MCV: 87.7 FL (ref 80.0–99.0)
MONOCYTES: 12 % (ref 5–13)
MPV: 11.5 FL (ref 8.9–12.9)
NEUTROPHILS: 48 % (ref 32–75)
NRBC: 0 PER 100 WBC
PLATELET: 277 10*3/uL (ref 150–400)
RBC: 3.75 M/uL — ABNORMAL LOW (ref 3.80–5.20)
RDW: 14.5 % (ref 11.5–14.5)
WBC: 6.5 10*3/uL (ref 3.6–11.0)

## 2022-03-06 LAB — METABOLIC PANEL, COMPREHENSIVE
A-G Ratio: 1 — ABNORMAL LOW (ref 1.1–2.2)
ALT (SGPT): 25 U/L (ref 12–78)
AST (SGOT): 11 U/L — ABNORMAL LOW (ref 15–37)
Albumin: 2.9 g/dL — ABNORMAL LOW (ref 3.5–5.0)
Alk. phosphatase: 73 U/L (ref 45–117)
Anion gap: 2 mmol/L — ABNORMAL LOW (ref 5–15)
BUN/Creatinine ratio: 22 — ABNORMAL HIGH (ref 12–20)
BUN: 44 MG/DL — ABNORMAL HIGH (ref 6–20)
Bilirubin, total: 0.2 MG/DL (ref 0.2–1.0)
CO2: 31 mmol/L (ref 21–32)
Calcium: 9 MG/DL (ref 8.5–10.1)
Chloride: 101 mmol/L (ref 97–108)
Creatinine: 1.97 MG/DL — ABNORMAL HIGH (ref 0.55–1.02)
Globulin: 3 g/dL (ref 2.0–4.0)
Glucose: 121 mg/dL — ABNORMAL HIGH (ref 65–100)
Potassium: 4.6 mmol/L (ref 3.5–5.1)
Protein, total: 5.9 g/dL — ABNORMAL LOW (ref 6.4–8.2)
Sodium: 134 mmol/L — ABNORMAL LOW (ref 136–145)
eGFR: 26 mL/min/{1.73_m2} — ABNORMAL LOW (ref >60)

## 2022-03-06 LAB — GLUCOSE, POC
Glucose (POC): 260 mg/dL — ABNORMAL HIGH (ref 65–117)
Glucose (POC): 141 mg/dL — ABNORMAL HIGH (ref 65–117)
Glucose (POC): 162 mg/dL — ABNORMAL HIGH (ref 65–117)
Glucose (POC): 98 mg/dL (ref 65–117)

## 2022-03-06 LAB — CULTURE, BLOOD, PAIRED
Culture result:: NO GROWTH
Culture: NO GROWTH

## 2022-03-06 LAB — CBC WITH AUTO DIFFERENTIAL
Band Neutrophils: 1 % (ref 0–6)
Basophils %: 2 % — ABNORMAL HIGH (ref 0–1)
Basophils Absolute: 0.1 10*3/uL (ref 0.0–0.1)
Eosinophils %: 5 % (ref 0–7)
Eosinophils Absolute: 0.3 10*3/uL (ref 0.0–0.4)
Granulocyte Absolute Count: 0 10*3/uL
Hematocrit: 32.9 % — ABNORMAL LOW (ref 35.0–47.0)
Hemoglobin: 10.4 g/dL — ABNORMAL LOW (ref 11.5–16.0)
Immature Granulocytes %: 0 %
Lymphocytes %: 32 % (ref 12–49)
Lymphocytes Absolute: 2.1 10*3/uL (ref 0.8–3.5)
MCH: 27.7 PG (ref 26.0–34.0)
MCHC: 31.6 g/dL (ref 30.0–36.5)
MCV: 87.7 FL (ref 80.0–99.0)
MPV: 11.5 FL (ref 8.9–12.9)
Monocytes %: 12 % (ref 5–13)
Monocytes Absolute: 0.8 10*3/uL (ref 0.0–1.0)
NRBC Absolute: 0 10*3/uL (ref 0.00–0.01)
Neutrophils %: 48 % (ref 32–75)
Neutrophils Absolute: 3.2 10*3/uL (ref 1.8–8.0)
Nucleated RBCs: 0 PER 100 WBC
Platelets: 277 10*3/uL (ref 150–400)
RBC: 3.75 M/uL — ABNORMAL LOW (ref 3.80–5.20)
RDW: 14.5 % (ref 11.5–14.5)
WBC: 6.5 10*3/uL (ref 3.6–11.0)

## 2022-03-06 LAB — COMPREHENSIVE METABOLIC PANEL
ALT: 25 U/L (ref 12–78)
AST: 11 U/L — ABNORMAL LOW (ref 15–37)
Albumin/Globulin Ratio: 1 — ABNORMAL LOW (ref 1.1–2.2)
Albumin: 2.9 g/dL — ABNORMAL LOW (ref 3.5–5.0)
Alkaline Phosphatase: 73 U/L (ref 45–117)
Anion Gap: 2 mmol/L — ABNORMAL LOW (ref 5–15)
BUN/Creatinine Ratio: 22 — ABNORMAL HIGH (ref 12–20)
BUN: 44 MG/DL — ABNORMAL HIGH (ref 6–20)
CO2: 31 mmol/L (ref 21–32)
Calcium: 9 MG/DL (ref 8.5–10.1)
Chloride: 101 mmol/L (ref 97–108)
Creatinine: 1.97 MG/DL — ABNORMAL HIGH (ref 0.55–1.02)
Est, Glom Filt Rate: 26 mL/min/{1.73_m2} — ABNORMAL LOW (ref >60)
Globulin: 3 g/dL (ref 2.0–4.0)
Glucose: 121 mg/dL — ABNORMAL HIGH (ref 65–100)
Potassium: 4.6 mmol/L (ref 3.5–5.1)
Sodium: 134 mmol/L — ABNORMAL LOW (ref 136–145)
Total Bilirubin: 0.2 MG/DL (ref 0.2–1.0)
Total Protein: 5.9 g/dL — ABNORMAL LOW (ref 6.4–8.2)

## 2022-03-06 LAB — POCT GLUCOSE
POC Glucose: 260 mg/dL — ABNORMAL HIGH (ref 65–117)
POC Glucose: 141 mg/dL — ABNORMAL HIGH (ref 65–117)
POC Glucose: 162 mg/dL — ABNORMAL HIGH (ref 65–117)
POC Glucose: 98 mg/dL (ref 65–117)

## 2022-03-06 MED ORDER — ALOGLIPTIN 6.25 MG TABLET
6.25 mg | Freq: Every day | ORAL | Status: DC
Start: 2022-03-06 — End: 2022-03-07
  Administered 2022-03-07: 17:00:00 via ORAL

## 2022-03-06 MED ORDER — CALCIUM CARBONATE 200 MG (500 MG) CHEWABLE TAB
200 mg calcium (500 mg) | ORAL | Status: DC | PRN
Start: 2022-03-06 — End: 2022-03-08
  Administered 2022-03-06 – 2022-03-08 (×3): via ORAL

## 2022-03-06 MED FILL — INSULIN LISPRO 100 UNIT/ML INJECTION: 100 unit/mL | SUBCUTANEOUS | Qty: 1

## 2022-03-06 MED FILL — ALOGLIPTIN 6.25 MG TABLET: 6.25 mg | ORAL | Qty: 2

## 2022-03-06 MED FILL — LYRICA 100 MG CAPSULE: 100 mg | ORAL | Qty: 3

## 2022-03-06 MED FILL — CLINDAMYCIN 150 MG CAP: 150 mg | ORAL | Qty: 4

## 2022-03-06 MED FILL — LEVETIRACETAM 500 MG TAB: 500 mg | ORAL | Qty: 1

## 2022-03-06 MED FILL — PANTOPRAZOLE 20 MG TAB, DELAYED RELEASE: 20 mg | ORAL | Qty: 1

## 2022-03-06 MED FILL — BUDESONIDE 0.25 MG/2 ML NEB SUSPENSION: 0.25 mg/2 mL | RESPIRATORY_TRACT | Qty: 1

## 2022-03-06 MED FILL — CALCIUM CARBONATE 200 MG (500 MG) CHEWABLE TAB: 200 mg calcium (500 mg) | ORAL | Qty: 1

## 2022-03-06 MED FILL — CARVEDILOL 6.25 MG TAB: 6.25 mg | ORAL | Qty: 2

## 2022-03-06 MED FILL — ENOXAPARIN 40 MG/0.4 ML SUB-Q SYRINGE: 40 mg/0.4 mL | SUBCUTANEOUS | Qty: 0.4

## 2022-03-06 MED FILL — CHILDREN'S ASPIRIN 81 MG CHEWABLE TABLET: 81 mg | ORAL | Qty: 1

## 2022-03-06 MED FILL — QUETIAPINE 25 MG TAB: 25 mg | ORAL | Qty: 1

## 2022-03-06 NOTE — Progress Notes (Signed)
 Bedside shift change report given to Joel,RN (oncoming nurse) by Greig PEAK (offgoing nurse). Report included the following information SBAR, Kardex, ED Summary, OR Summary, Intake/Output, MAR, Recent Results, Med Rec Status, and Cardiac Rhythm NS .
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
## 2022-03-06 NOTE — Progress Notes (Signed)
 Alogliptin  - Renal dosing by Pharmacy  Current regimen:  12.5 mg Q daily  Recent Labs     03/06/22  0439 03/05/22  1315 03/04/22  0942   CREA 1.97* 1.60* 1.21*   BUN 44* 41* 33*     Estimated CrCl:  26.7 ml/min    Plan:   Change to 6.25 mg Q daily with respect to estimated creatinine clearance (CrCl <30 mL/min) per [facility]-approved Renal Dosing Adjustment protocol.  Pharmacy will continue to monitor this patient daily for changes in clinical status and renal function.    Please contact pharmacy with any questions/clarifications at [phone].     Prentice Bray, PharmD  Clinical Pharmacist, Orthopedics and Med/Surg  [facility] [phone])

## 2022-03-07 ENCOUNTER — Inpatient Hospital Stay: Admit: 2022-03-07 | Payer: MEDICARE | Attending: Acute Care

## 2022-03-07 LAB — METABOLIC PANEL, BASIC
Anion gap: 4 mmol/L — ABNORMAL LOW (ref 5–15)
BUN/Creatinine ratio: 27 — ABNORMAL HIGH (ref 12–20)
BUN: 43 MG/DL — ABNORMAL HIGH (ref 6–20)
CO2: 30 mmol/L (ref 21–32)
Calcium: 9.1 MG/DL (ref 8.5–10.1)
Chloride: 105 mmol/L (ref 97–108)
Creatinine: 1.57 MG/DL — ABNORMAL HIGH (ref 0.55–1.02)
Glucose: 143 mg/dL — ABNORMAL HIGH (ref 65–100)
Potassium: 5.5 mmol/L — ABNORMAL HIGH (ref 3.5–5.1)
Sodium: 139 mmol/L (ref 136–145)
eGFR: 34 mL/min/{1.73_m2} — ABNORMAL LOW (ref >60)

## 2022-03-07 LAB — GLUCOSE, POC
Glucose (POC): 158 mg/dL — ABNORMAL HIGH (ref 65–117)
Glucose (POC): 114 mg/dL (ref 65–117)
Glucose (POC): 151 mg/dL — ABNORMAL HIGH (ref 65–117)
Glucose (POC): 145 mg/dL — ABNORMAL HIGH (ref 65–117)

## 2022-03-07 LAB — POCT GLUCOSE
POC Glucose: 158 mg/dL — ABNORMAL HIGH (ref 65–117)
POC Glucose: 114 mg/dL (ref 65–117)
POC Glucose: 151 mg/dL — ABNORMAL HIGH (ref 65–117)
POC Glucose: 145 mg/dL — ABNORMAL HIGH (ref 65–117)

## 2022-03-07 LAB — BASIC METABOLIC PANEL
Anion Gap: 4 mmol/L — ABNORMAL LOW (ref 5–15)
BUN/Creatinine Ratio: 27 — ABNORMAL HIGH (ref 12–20)
BUN: 43 MG/DL — ABNORMAL HIGH (ref 6–20)
CO2: 30 mmol/L (ref 21–32)
Calcium: 9.1 MG/DL (ref 8.5–10.1)
Chloride: 105 mmol/L (ref 97–108)
Creatinine: 1.57 MG/DL — ABNORMAL HIGH (ref 0.55–1.02)
Est, Glom Filt Rate: 34 mL/min/{1.73_m2} — ABNORMAL LOW (ref >60)
Glucose: 143 mg/dL — ABNORMAL HIGH (ref 65–100)
Potassium: 5.5 mmol/L — ABNORMAL HIGH (ref 3.5–5.1)
Sodium: 139 mmol/L (ref 136–145)

## 2022-03-07 MED ORDER — SODIUM CHLORIDE 0.9 % IV
INTRAVENOUS | Status: DC | PRN
Start: 2022-03-07 — End: 2022-03-07
  Administered 2022-03-07: 19:00:00 via INTRAVENOUS

## 2022-03-07 MED ORDER — PHENYLEPHRINE 10 MG/ML INJECTION
10 mg/mL | INTRAMUSCULAR | Status: DC | PRN
Start: 2022-03-07 — End: 2022-03-07
  Administered 2022-03-07 (×2): via INTRAVENOUS

## 2022-03-07 MED ORDER — LABETALOL 5 MG/ML IV SOLN
5 mg/mL | INTRAVENOUS | Status: DC | PRN
Start: 2022-03-07 — End: 2022-03-07
  Administered 2022-03-07: 21:00:00 via INTRAVENOUS

## 2022-03-07 MED ORDER — ONDANSETRON (PF) 4 MG/2 ML INJECTION
4 mg/2 mL | INTRAMUSCULAR | Status: DC | PRN
Start: 2022-03-07 — End: 2022-03-07
  Administered 2022-03-07: 19:00:00 via INTRAVENOUS

## 2022-03-07 MED ORDER — PHENYLEPHRINE IN 0.9 % SODIUM CL (40 MCG/ML) IV SYRINGE
0.4 mg/10 mL (40 mcg/mL) | INTRAVENOUS | Status: DC | PRN
Start: 2022-03-07 — End: 2022-03-07
  Administered 2022-03-07: 19:00:00 via INTRAVENOUS

## 2022-03-07 MED ORDER — SUCCINYLCHOLINE CHLORIDE 20 MG/ML INJECTION
20 mg/mL | INTRAMUSCULAR | Status: DC | PRN
Start: 2022-03-07 — End: 2022-03-07
  Administered 2022-03-07: 19:00:00 via INTRAVENOUS

## 2022-03-07 MED ORDER — GADOTERIDOL 279.3 MG/ML INTRAVENOUS SOLUTION
279.3 mg/mL | Freq: Once | INTRAVENOUS | Status: AC
Start: 2022-03-07 — End: 2022-03-07

## 2022-03-07 MED ORDER — PROPOFOL 10 MG/ML IV EMUL
10 mg/mL | INTRAVENOUS | Status: DC | PRN
Start: 2022-03-07 — End: 2022-03-07
  Administered 2022-03-07: 19:00:00 via INTRAVENOUS

## 2022-03-07 MED ORDER — DEXAMETHASONE SODIUM PHOSPHATE 4 MG/ML IJ SOLN
4 mg/mL | INTRAMUSCULAR | Status: DC | PRN
Start: 2022-03-07 — End: 2022-03-07
  Administered 2022-03-07: 19:00:00 via INTRAVENOUS

## 2022-03-07 MED ORDER — ROCURONIUM 10 MG/ML IV
10 mg/mL | INTRAVENOUS | Status: DC | PRN
Start: 2022-03-07 — End: 2022-03-07
  Administered 2022-03-07: 19:00:00 via INTRAVENOUS

## 2022-03-07 MED ORDER — GADOTERIDOL 279.3 MG/ML INTRAVENOUS SOLUTION
279.3 mg/mL | INTRAVENOUS | Status: AC
Start: 2022-03-07 — End: 2022-03-07
  Administered 2022-03-07: 20:00:00 via INTRAVENOUS

## 2022-03-07 MED ORDER — SODIUM CHLORIDE 0.9 % IJ SYRG
Freq: Once | INTRAMUSCULAR | Status: AC
Start: 2022-03-07 — End: 2022-03-07
  Administered 2022-03-07: 20:00:00 via INTRAVENOUS

## 2022-03-07 MED ORDER — ALOGLIPTIN 6.25 MG TABLET
6.25 mg | Freq: Every day | ORAL | Status: DC
Start: 2022-03-07 — End: 2022-03-08
  Administered 2022-03-08: 14:00:00 via ORAL

## 2022-03-07 MED FILL — CLINDAMYCIN 150 MG CAP: 150 mg | ORAL | Qty: 4

## 2022-03-07 MED FILL — PROHANCE 279.3 MG/ML INTRAVENOUS SOLUTION: 279.3 mg/mL | INTRAVENOUS | Qty: 20

## 2022-03-07 MED FILL — LYRICA 100 MG CAPSULE: 100 mg | ORAL | Qty: 3

## 2022-03-07 MED FILL — QUETIAPINE 25 MG TAB: 25 mg | ORAL | Qty: 1

## 2022-03-07 MED FILL — LEVETIRACETAM 500 MG TAB: 500 mg | ORAL | Qty: 1

## 2022-03-07 MED FILL — BUDESONIDE 0.25 MG/2 ML NEB SUSPENSION: 0.25 mg/2 mL | RESPIRATORY_TRACT | Qty: 1

## 2022-03-07 MED FILL — PANTOPRAZOLE 20 MG TAB, DELAYED RELEASE: 20 mg | ORAL | Qty: 1

## 2022-03-07 MED FILL — INSULIN LISPRO 100 UNIT/ML INJECTION: 100 unit/mL | SUBCUTANEOUS | Qty: 1

## 2022-03-07 MED FILL — ENOXAPARIN 40 MG/0.4 ML SUB-Q SYRINGE: 40 mg/0.4 mL | SUBCUTANEOUS | Qty: 0.4

## 2022-03-07 MED FILL — GLUCOSE 4 GRAM CHEWABLE TAB: 4 gram | ORAL | Qty: 40

## 2022-03-07 MED FILL — CHILDREN'S ASPIRIN 81 MG CHEWABLE TABLET: 81 mg | ORAL | Qty: 1

## 2022-03-07 MED FILL — CARVEDILOL 12.5 MG TAB: 12.5 mg | ORAL | Qty: 1

## 2022-03-07 MED FILL — CARVEDILOL 6.25 MG TAB: 6.25 mg | ORAL | Qty: 2

## 2022-03-07 MED FILL — ALOGLIPTIN 6.25 MG TABLET: 6.25 mg | ORAL | Qty: 1

## 2022-03-07 MED FILL — CALCIUM CARBONATE 200 MG (500 MG) CHEWABLE TAB: 200 mg calcium (500 mg) | ORAL | Qty: 1
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
## 2022-03-07 NOTE — Anesthesia Pre-Procedure Evaluation (Signed)
Relevant Problems   NEUROLOGY   (+) Dementia without behavioral disturbance (HCC)      CARDIOVASCULAR   (+) Essential hypertension, benign      ENDOCRINE   (+) Severe obesity (BMI 35.0-39.9) with comorbidity (HCC)   (+) Type 2 diabetes mellitus      HEMATOLOGY   (+) Iron deficiency anemia       Anesthetic History   No history of anesthetic complications            Review of Systems / Medical History  Patient summary reviewed, nursing notes reviewed and pertinent labs reviewed    Pulmonary            Asthma        Neuro/Psych       CVA  Psychiatric history    Comments: Parkinsons Cardiovascular    Hypertension                   GI/Hepatic/Renal  Within defined limits              Endo/Other    Diabetes    Obesity     Other Findings              Physical Exam    Airway  Mallampati: II  TM Distance: 4 - 6 cm  Neck ROM: normal range of motion   Mouth opening: Normal     Cardiovascular    Rhythm: regular  Rate: normal         Dental  No notable dental hx       Pulmonary  Breath sounds clear to auscultation               Abdominal  Abdominal exam normal       Other Findings            Anesthetic Plan    ASA: 3  Anesthesia type: general          Induction: Intravenous  Anesthetic plan and risks discussed with: Patient

## 2022-03-07 NOTE — Progress Notes (Signed)
 Pt prepped for MRI. Re-screened. She states she's been NPO since 6:30pm yesterday. Has had water  with meds today. Pt up to w/c and voided a moderate amount. Consent obtained. Anesthesia explained.  Anesthesia saw pt. Pt denies coming down with IV Ativan. States she came down with no medication the first time and a pill the second time.

## 2022-03-07 NOTE — Progress Notes (Signed)
Alogliptin - Renal dosing by Pharmacy  Current regimen:  6.25 mg Q daily  Recent Labs     03/07/22  0104 03/06/22  0439 03/05/22  1315   CREA 1.57* 1.97* 1.60*   BUN 43* 44* 41*     Estimated CrCl:  33.5 ml/min    Plan:   Change to 12.5 mg Q daily with respect to estimated creatinine clearance (CrCl >30 mL/min) per [facility]-approved Renal Dosing Adjustment protocol.  Pharmacy will continue to monitor this patient daily for changes in clinical status and renal function.    Please contact pharmacy with any questions/clarifications at [phone].     Eulogio Bear, PharmD  Clinical Pharmacist, Orthopedics and Med/Surg  [facility] [phone])

## 2022-03-08 LAB — GLUCOSE, POC
Glucose (POC): 159 mg/dL — ABNORMAL HIGH (ref 65–117)
Glucose (POC): 119 mg/dL — ABNORMAL HIGH (ref 65–117)
Glucose (POC): 149 mg/dL — ABNORMAL HIGH (ref 65–117)

## 2022-03-08 LAB — METABOLIC PANEL, BASIC
Anion gap: 2 mmol/L — ABNORMAL LOW (ref 5–15)
BUN/Creatinine ratio: 28 — ABNORMAL HIGH (ref 12–20)
BUN: 34 MG/DL — ABNORMAL HIGH (ref 6–20)
CO2: 29 mmol/L (ref 21–32)
Calcium: 8.9 MG/DL (ref 8.5–10.1)
Chloride: 110 mmol/L — ABNORMAL HIGH (ref 97–108)
Creatinine: 1.22 MG/DL — ABNORMAL HIGH (ref 0.55–1.02)
Glucose: 120 mg/dL — ABNORMAL HIGH (ref 65–100)
Potassium: 5.3 mmol/L — ABNORMAL HIGH (ref 3.5–5.1)
Sodium: 141 mmol/L (ref 136–145)
eGFR: 45 mL/min/{1.73_m2} — ABNORMAL LOW (ref >60)

## 2022-03-08 LAB — POCT GLUCOSE
POC Glucose: 159 mg/dL — ABNORMAL HIGH (ref 65–117)
POC Glucose: 119 mg/dL — ABNORMAL HIGH (ref 65–117)
POC Glucose: 149 mg/dL — ABNORMAL HIGH (ref 65–117)

## 2022-03-08 LAB — BASIC METABOLIC PANEL
Anion Gap: 2 mmol/L — ABNORMAL LOW (ref 5–15)
BUN/Creatinine Ratio: 28 — ABNORMAL HIGH (ref 12–20)
BUN: 34 MG/DL — ABNORMAL HIGH (ref 6–20)
CO2: 29 mmol/L (ref 21–32)
Calcium: 8.9 MG/DL (ref 8.5–10.1)
Chloride: 110 mmol/L — ABNORMAL HIGH (ref 97–108)
Creatinine: 1.22 MG/DL — ABNORMAL HIGH (ref 0.55–1.02)
Est, Glom Filt Rate: 45 mL/min/{1.73_m2} — ABNORMAL LOW (ref >60)
Glucose: 120 mg/dL — ABNORMAL HIGH (ref 65–100)
Potassium: 5.3 mmol/L — ABNORMAL HIGH (ref 3.5–5.1)
Sodium: 141 mmol/L (ref 136–145)

## 2022-03-08 MED ORDER — PREGABALIN 300 MG CAP
300 mg | ORAL_CAPSULE | Freq: Two times a day (BID) | ORAL | 0 refills | Status: DC
Start: 2022-03-08 — End: 2022-04-08

## 2022-03-08 MED ORDER — CLINDAMYCIN 300 MG CAP
300 mg | ORAL_CAPSULE | Freq: Three times a day (TID) | ORAL | 0 refills | Status: AC
Start: 2022-03-08 — End: 2022-03-11

## 2022-03-08 MED FILL — CLINDAMYCIN 150 MG CAP: 150 mg | ORAL | Qty: 4

## 2022-03-08 MED FILL — CARVEDILOL 12.5 MG TAB: 12.5 mg | ORAL | Qty: 1

## 2022-03-08 MED FILL — LEVETIRACETAM 500 MG TAB: 500 mg | ORAL | Qty: 1

## 2022-03-08 MED FILL — PANTOPRAZOLE 20 MG TAB, DELAYED RELEASE: 20 mg | ORAL | Qty: 1

## 2022-03-08 MED FILL — QUETIAPINE 25 MG TAB: 25 mg | ORAL | Qty: 1

## 2022-03-08 MED FILL — ENOXAPARIN 40 MG/0.4 ML SUB-Q SYRINGE: 40 mg/0.4 mL | SUBCUTANEOUS | Qty: 0.4

## 2022-03-08 MED FILL — CALCIUM CARBONATE 200 MG (500 MG) CHEWABLE TAB: 200 mg calcium (500 mg) | ORAL | Qty: 1

## 2022-03-08 MED FILL — ALOGLIPTIN 6.25 MG TABLET: 6.25 mg | ORAL | Qty: 2

## 2022-03-08 MED FILL — BUDESONIDE 0.25 MG/2 ML NEB SUSPENSION: 0.25 mg/2 mL | RESPIRATORY_TRACT | Qty: 1

## 2022-03-08 MED FILL — CHILDREN'S ASPIRIN 81 MG CHEWABLE TABLET: 81 mg | ORAL | Qty: 1

## 2022-03-08 MED FILL — LYRICA 100 MG CAPSULE: 100 mg | ORAL | Qty: 3

## 2022-03-08 MED FILL — INSULIN LISPRO 100 UNIT/ML INJECTION: 100 unit/mL | SUBCUTANEOUS | Qty: 1
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
## 2022-03-08 NOTE — Progress Notes (Signed)
 Transition of Care Plan  RUR- Low    DISPOSITION: Return to Group Home, arranged transport with [facility], also from the the NP requested to send email requesting PCP  F/U with PCP/Specialist     Transport: [facility]        Richardson LITTIE Ness, BSW, CRM  10:59 AM      Addendum:  This CM spoke with patient who stated she can not dc by cab and will be calling family.    Richardson LITTIE Maddox, BSW, CRM  12:00 PM

## 2022-03-10 NOTE — Progress Notes (Signed)
 Care Transitions Initial Call    Call within 2 business days of discharge: Yes     Patient Current Location: [place]     Patient: Jamie Bryant Patient DOB: 04-23-1941 MRN: [id number]    Last Discharge [facility] Facility       Date Complaint Diagnosis Description Type Department Provider    02/28/22 Eye Pain Periorbital cellulitis of right eye ... ED to Hosp-Admission (Discharged) (ADMIT) [id number] Minor, Doyal GAILS, NP; Victory Puma...            Was this an external facility discharge? No   Challenges to be reviewed by the provider   Additional needs identified to be addressed with provider: yes  Needs a PCP, does not have a ACP on file   03/07/22 01:04 03/08/22 02:35   Potassium 5.5 (H) 5.3 (H)   (H): Data is abnormally high     03/06/22 04:39 03/07/22 01:04 03/08/22 02:35   Creatinine 1.97 (H) 1.57 (H) 1.22 (H)   (H): Data is abnormally high    HEMOGLOBIN A1C WITH EAG  Order: [id number]  Collected 02/05/2021 16:18    Status: Final result    Next appt: None    Dx: Type 2 diabetes mellitus with other n...    0 Result Notes      Component 10/18/20 2304   Hemoglobin A1c 6.8 High  CM    Comment: NEW METHOD PLEASE NOTE NEW REFERENCE RANGE                Method of communication with provider : none    Discussed COVID-19 related testing which was not done at this time.   Test results were not done.   Patient informed of results, if available? no     Advance Care Planning:   Does patient have an Advance Directive: decision makers updated, not on file; education provided. Sent information to e mail [email or web address]    Inpatient Readmission Risk score: Unplanned Readmit Risk Score: 11.4    Was this a readmission? no   Patient stated reason for the admission: eye swelling    Patients top risk factors for readmission: medical condition-Recurrent cellulitis, DM, asthma, HTN, Psychiatric issues    Interventions to address risk factors: Scheduled appointment with PCP-Dr Bluford 04/21/22 at 3 pm per patient request.of later time, Scheduled  appointment with Specialist-Neuro 08/18/22, and Assessment and support for treatment adherence and medication management-Recurrent cellulitis, DM, asthma, HTN, Psychiatric issues      Care Transition Nurse (CTN) contacted the patient by telephone to perform post hospital discharge assessment. Verified name and DOB with patient as identifiers. Provided introduction to self, and explanation of the CTN role.     CTN reviewed discharge instructions, medical action plan and red flags with patient who verbalized understanding. Were discharge instructions available to patient? yes. Reviewed appropriate site of care based on symptoms and resources available to patient including: PCP and Specialist. Patient given an opportunity to ask questions and does not have any further questions or concerns at this time. The patient agrees to contact the PCP office for questions related to their healthcare.     Medication reconciliation was performed with patient, who verbalizes understanding of administration of home medications. Referral to Pharm D needed: no     Home Health/Outpatient orders at discharge: none    Durable Medical Equipment ordered at discharge: None  Was patient discharged with a pulse oximeter? no    Discussed follow-up appointments. If no appointment was previously scheduled, appointment scheduling offered: yes. Is  follow up appointment scheduled within 7 days of discharge? no.     [facility] follow up appointment(s): No future appointments.  Non-[facility] follow up appointment(s): Neuro 08/18/22, Dr Prentice Jurist 04/21/22 at 3 pm, patient could not make the 9 am appt on 4/10 offered.     Plan for follow-up call in 5-7 days based on severity of symptoms and risk factors.    CTN provided contact information for future needs.     Goals Addressed                   This Visit's Progress     Attends follow-up appointments as directed.        03/11/22   Patient PCP retired in Dec, has not seen anybody since then.  I called prior PCP  office ([facility]) and they are booked out until June for new patient appts.    Called [facility] and they are booked for new patient appt until June also.  Called [facility] and they are booked out to June also.  Patient has an appt with new PCP Dr Prentice Jurist April 10th at 9 am with a 8:30 am arrival at [facility] on [address].  When I called patient she can not get up that early, new appt with same provider is now 4/24 at 3 pm.  Patient agreeable to having [facility] coming and seeing her, [facility] sent.   Patient does have an appt with Neuro on 08/18/22 at [facility] MSN, RN, CCM / Care Transition Nurse / [phone]        Prevent complications post hospitalization.        03/11/22   Patient states she has no strength, is using a walker or cane, denies any fever, is eating and drinking well.   Patient states that her eyebrow and side of face itches.    Patient has a B/P cuff but it is not working, has placed new batteries in it and still not working.  Patient is to hold her Accupril  for 5 days and to take her B/P during that time.  Son was driving her to an appt and stated he would help her and check the B/P cuff, patient stated her's was 79 years old and needs a prescription for a new one and a W/C.  Explained that she would need to see PCP before she can be given a prescription.  Patient is not taking her Lopressor 50 mg BID as directed-didn't help last time.   Patient states she has trouble affording her medication and food, her most expensive medicine is Lyrica  and Januvia .   Patient is taking her ABX as directed.   Patient does not have a ACP and has not talked to family about this, I sent her ACP information to e mail [email or web address] and son's e mail [email or web address].  Also sent son # to [facility] for UAI screening and SNAP benefits.   E Mail sent for Pharm D help for Lyrica  and Januvia .   Monitor if  patient did get her B/P cuff fixed taking her Accupril , taking Lopressor, PO intake, activity tolerance, finish ABX, itch on side of face and eye brow? Talk with PharmD?     Ike Russel MSN, RN, CCM / Care Transition Nurse / [phone]

## 2022-03-12 NOTE — Telephone Encounter (Signed)
 Attempt made to contact patient at both home numbers.    There was no answer at the 1st home number and the TAD was full and would not accept any messages.  The other home number has been disconnected or is not in service according to the announcement.    I will continue to attempt to contact this patient.    Lindie Mosses, CphT  [facility] Support Specialist  [phone]

## 2022-03-12 NOTE — Telephone Encounter (Signed)
-----   Message from Tedd Sias, [facility] sent at 03/12/2022 10:38 AM EDT -----  Regarding: FW: Patient assistance for cost of Lyrica and Januvia  Hi, new referral from [facility] setting. Are you able to reach out to the patient to see if she can do a virtual / phone call visit next week or after? Thank you - she's been referred to discuss medication costs / access.     ----- Message -----  From: Willia Craze, RN  Sent: 03/12/2022  10:28 AM EDT  To: Tedd Sias, PHARMD  Subject: Patient assistance for cost of Lyrica and Ja#    Hi Sallie,  I spoke to this patient yesterday, having trouble affording her Lyrica and Januvia.  Is in process of asking for UAI for Medicaid coverage.      Thanks!  Heidi

## 2022-03-14 NOTE — Telephone Encounter (Signed)
 2nd Attempt made to contact patient at both home numbers.     There was no answer at the 1st home number and the TAD was full and would not accept any messages.  The other home number has been disconnected or is not in service according to the announcement.    No further patient outreach planned at this time.    Letter mailed to patient with the above information.    Lindie Mosses, CphT  Adult nurse    For Pharmacy Admin Tracking Only    Program: [facility]  CPA in place: No  Gap Closed?: No  Time Spent (min): 5
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
## 2022-04-02 NOTE — ACP (Advance Care Planning) (Signed)
Initial outbound call made to patient who requested docs be emailed to [email or web address]. Follow up scheduled for 04/04/22.

## 2022-04-03 ENCOUNTER — Encounter

## 2022-04-03 NOTE — Progress Notes (Signed)
04/03/2022  Follow up  SW left voicemail with pt's son requesting call back to discuss pt's need and plan of care.  SW will follow up once call is returned.  Henry Russel, LCSW  [facility] Ambulatory Care Coordination Team  Phone: [phone]     03/25/2022  Follow up  SW received referral from [facility] requesting follow up with pt/son to discuss in home care resources. CTN reported that son is interested in completing Uniform Assessment Screening and SNAP application.  SW contacted pt, introduced self and explained reason for call.  Pt is living at [facility] independent living  but is not satisfied with living environment.  Pt will like to find alternative housing but does not want Assisted Living or Nursing home placement.  She also reported feeling fatigue and weak which impacts her ability to complete certain ADLs and household tasks.  Home Health was not ordered prior to discharge.from recent hospital stay.  Pt will like to receive HH therapy for strength ing and her upcoming PCP appointment is not until 4-24 at 3pm to discuss need and order for services.     Plan  Pt provided permission for SW to follow up with her son, Rande Lawman for further discussion regarding her needs.  SW will also attempt contact with her Adult Protective Service, case worker to inquire about status and reason for open case.  Reviewed Medicaid benefit and income guideline and process of HH services.  Henry Russel, LCSW    [facility] Ambulatory Care Coordination Team  Phone: [phone]

## 2022-04-04 NOTE — ACP (Advance Care Planning) (Signed)
Outbound call made to patient as scheduled. ACP conversation started with patient. Follow up scheduled for 04/11/22 to complete.

## 2022-04-08 ENCOUNTER — Ambulatory Visit: Attending: Internal Medicine | Primary: Student in an Organized Health Care Education/Training Program

## 2022-04-08 ENCOUNTER — Ambulatory Visit
Admit: 2022-04-08 | Payer: MEDICARE | Attending: Internal Medicine | Primary: Student in an Organized Health Care Education/Training Program

## 2022-04-08 DIAGNOSIS — L299 Pruritus, unspecified: Secondary | ICD-10-CM

## 2022-04-08 MED ORDER — HYDROXYZINE 10 MG TAB
10 mg | ORAL_TABLET | Freq: Three times a day (TID) | ORAL | 0 refills | Status: AC | PRN
Start: 2022-04-08 — End: 2022-04-18
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
## 2022-04-09 NOTE — Progress Notes (Signed)
Goals Addressed                   This Visit's Progress     Supportive resources in place to maintain patient in the community (ie., home health, equipment, DME, refer to, etc.)          04/09/2022  Follow up  SW spoke with pt for follow up.  Pt stated that she participated in her follow up appointment with PCP, Dr. Mingo Amber to establish care on 4-11 at [facility].  Pt was confused about the scheduling for her recent PCP appointment with Dr. Karie Mainland and appt with Dr. Judie Grieve that is scheduled for 04-21-22.  SW provided explantation based on CTN notes that were in the chart regarding appointment.  Pt reported that she will like to follow up with Dr. Judie Grieve on 04-21-22 to decide on which provider she will like to have as her PCP, however she acknowledged having difficulty with securing transportation.  Pt relies on her friends for transport.  SW explained the process and importance of having an established PCP for [facility] orders.  Pt understood and will like to place on hold on obtaining [facility] services until she is able to explore options with choosing a PCP.      Plan  SW will follow up with pt at later date for further discussion.      SW also made pt aware that I have been unable to get in contact with her son for further discussion regarding her care and asked if she could give son my contact information for follow up.      SW also spoke with CTN, Daymon Larsen regarding pt's concerns and needs.  CTN will transfer pt to [facility] for further management.  Henry Russel, LCSW  [facility] Ambulatory Care Coordination Team  Phone: [phone]     Plan  SW will follow up with PCP regarding identified [facility] agency.      Henry Russel, LCSW  [facility] Ambulatory Care Coordination Team  Phone: [phone]     03/25/2022  Follow up  SW received referral from [facility] requesting follow up with pt/son to discuss in home care resources. CTN reported that son is interested in completing Uniform Assessment  Screening and SNAP application.  SW contacted pt, introduced self and explained reason for call.  Pt is living at [facility] independent living  but is not satisfied with living environment.  Pt will like to find alternative housing but does not want Assisted Living or Nursing home placement.  She also reported feeling fatigue and weak which impacts her ability to complete certain ADLs and household tasks.  Home Health was not ordered prior to discharge.from recent hospital stay.  Pt will like to receive HH therapy for strength ing and her upcoming PCP appointment is not until 4-24 at 3pm to discuss need and order for services.    Plan  Pt provided permission for SW to follow up with her son, Rande Lawman for further discussion regarding her needs.  SW will also attempt contact with her Adult Protective Service, case worker to inquire about status and reason for open case.  Reviewed Medicaid benefit and income guideline and process of HH services.  Henry Russel, LCSW    [facility] Ambulatory Care Coordination Team  Phone: [phone]

## 2022-04-11 NOTE — Progress Notes (Signed)
Goals Addressed                   This Visit's Progress     Supportive resources in place to maintain patient in the community (ie., home health, equipment, DME, refer to, etc.)          04/10/22  Follow up  SW and CTN, Daymon Larsen discussed pt updates.  Pt will make plans to attending her PCP appointment with Dr. Judie Grieve on 04-21-22.  CTN also provided pt's son contact information for [facility] [employer] to assist with placement options.  Per son, pt does not qualify for Medicaid d/t income.  SW will follow up at later date for further discussion.  Henry Russel, LCSW  [facility] Ambulatory Care Coordination Team  Phone: [phone]     04/09/2022  Follow up  SW spoke with pt for follow up.  Pt stated that she participated in her follow up appointment with PCP, Dr. Mingo Amber to establish care on 4-11 at [facility].  Pt was confused about the scheduling for her recent PCP appointment with Dr. Karie Mainland and appt with Dr. Judie Grieve that is scheduled for 04-21-22.  SW provided explantation based on CTN notes that were in the chart regarding appointment.  Pt reported that she will like to follow up with Dr. Judie Grieve on 04-21-22 to decide on which provider she will like to have as her PCP, however she acknowledged having difficulty with securing transportation.  Pt relies on her friends for transport.  SW explained the process and importance of having an established PCP for [facility] orders.  Pt understood and will like to place on hold on obtaining [facility] services until she is able to explore options with choosing a PCP.      Plan  SW will follow up with pt at later date for further discussion.      SW also made pt aware that I have been unable to get in contact with her son for further discussion regarding her care and asked if she could give son my contact information for follow up.      SW also spoke with CTN, Daymon Larsen regarding pt's concerns and needs.  CTN will transfer pt to [facility] for further  management.  Henry Russel, LCSW  [facility] Ambulatory Care Coordination Team  Phone: [phone]     Plan  SW will follow up with PCP regarding identified [facility] agency.      Henry Russel, LCSW  [facility] Ambulatory Care Coordination Team  Phone: [phone]     03/25/2022  Follow up  SW received referral from [facility] requesting follow up with pt/son to discuss in home care resources. CTN reported that son is interested in completing Uniform Assessment Screening and SNAP application.  SW contacted pt, introduced self and explained reason for call.  Pt is living at [facility] independent living  but is not satisfied with living environment.  Pt will like to find alternative housing but does not want Assisted Living or Nursing home placement.  She also reported feeling fatigue and weak which impacts her ability to complete certain ADLs and household tasks.  Home Health was not ordered prior to discharge.from recent hospital stay.  Pt will like to receive HH therapy for strength ing and her upcoming PCP appointment is not until 4-24 at 3pm to discuss need and order for services.    Plan  Pt provided permission for SW to follow up with  her son, Rande Lawman for further discussion regarding her needs.  SW will also attempt contact with her Adult Protective Service, case worker to inquire about status and reason for open case.  Reviewed Medicaid benefit and income guideline and process of HH services.  Henry Russel, LCSW    [facility] Ambulatory Care Coordination Team  Phone: [phone]

## 2022-04-11 NOTE — ACP (Advance Care Planning) (Signed)
Outbound call made to patient as scheduled. Patient requesting to complete on 04/14/22.

## 2022-04-14 NOTE — ACP (Advance Care Planning) (Signed)
Date of Conversation:    [] Yes [x] No  Patient has prior advance directive?  [] Yes [x] No  Agent Present (in person or by phone)    Meeting Outcomes:   [x]  ACP discussion completed   [x]  Advance directive form completed   [x]  All elements of Docu-sign completed   [x]  Copy sent to Central Scanning to be scanned into electronic medical record   [x]  Updated Decision Maker info in Epic's ACP Navigator    [x]  Recommended to review AMD annually or upon change in health status      Primary Agent's Name:  Relationship to Patient: son      Secondary Agent's Name:Erika Harrington  Relationship to Patient:daughter    Agent confirms Willingness to:  [x] Yes [] No   Accept role  [x] Yes [] No   Talk with individual about his/her goals, values, & preferences  [x] Yes [] No   Follow pt's decisions, even if disagrees with these decisions  [x] Yes  [] No   Make decisions in difficult moments      CPR Intro for Patients Without Chronic Illness:  []  N/A  [x]  Yes  []  No   Educated patient regarding differences between AMD and DDNR  [x]  Yes  []  No   Provided CPR Fact Sheet for additional information       [x]  Yes  []  No   Patient requests attempts at CPR if heart/breathing stops    [x]  Yes  []  No   Educated patient regarding differences between AMD and DDNR  []  Yes  []  No  [x]  N/A   If patient requested DDNR order, referred to provider for follow-up        [x]  This note routed to one or more involved healthcare providers
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
## 2022-04-23 NOTE — Progress Notes (Signed)
Goals Addressed                   This Visit's Progress     Supportive resources in place to maintain patient in the community (ie., home health, equipment, DME, refer to, etc.)          04/24/2022  Follow up  SW left voicemail with pt requesting call back for follow up.  SW also received updated from CTN.      04/10/22  Follow up  SW and CTN, Daymon Larsen discussed pt updates.  Pt will make plans to attending her PCP appointment with Dr. Judie Grieve on 04-21-22.  CTN also provided pt's son contact information for [facility], [employer] [facility] to assist with placement options.  Per son, pt does not qualify for Medicaid d/t income.  SW will follow up at later date for further discussion.  Henry Russel, LCSW  [facility] Ambulatory Care Coordination Team  Phone: [phone]     04/09/2022  Follow up  SW spoke with pt for follow up.  Pt stated that she participated in her follow up appointment with PCP, Dr. Mingo Amber to [facility]  Pt was confused about the scheduling for her recent PCP appointment with Dr. Karie Mainland and appt with Dr. Judie Grieve that is scheduled for 04-21-22.  SW provided explantation based on CTN notes that were in the chart regarding appointment.  Pt reported that she will like to follow up with Dr. Judie Grieve on 04-21-22 to decide on which provider she will like to have as her PCP, however she acknowledged having difficulty with securing transportation.  Pt relies on her friends for transport.  SW explained the process and importance of having an established PCP for [facility] orders.  Pt understood and will like to place on hold on obtaining [facility] services until she is able to explore options with choosing a PCP.      Plan  SW will follow up with pt at later date for further discussion.      SW also made pt aware that I have been unable to get in contact with her son for further discussion regarding her care and asked if she could give son my contact information for follow up.       SW also spoke with CTN, Daymon Larsen regarding pt's concerns and needs.  CTN will transfer pt to [facility] for further management.  Henry Russel, LCSW  [facility] Ambulatory Care Coordination Team  Phone: [phone]     Plan  SW will follow up with PCP regarding identified [facility] agency.      Henry Russel, LCSW   [facility] Ambulatory Care Coordination Team  Phone: [phone]     03/25/2022  Follow up  SW received referral from [facility] requesting follow up with pt/son to discuss in home care resources. CTN reported that son is interested in completing Uniform Assessment Screening and SNAP application.  SW contacted pt, introduced self and explained reason for call.  Pt is living at [facility] independent living  but is not satisfied with living environment.  Pt will like to find alternative housing but does not want Assisted Living or Nursing home placement.  She also reported feeling fatigue and weak which impacts her ability to complete certain ADLs and household tasks.  Home Health was not ordered prior to discharge.from recent hospital stay.  Pt will like to receive HH therapy for strength ing and her upcoming PCP  appointment is not until 4-24 at 3pm to discuss need and order for services.    Plan  Pt provided permission for SW to follow up with her son, Rande Lawman for further discussion regarding her needs.  SW will also attempt contact with her Adult Protective Service, case worker to inquire about status and reason for open case.  Reviewed Medicaid benefit and income guideline and process of HH services.  Henry Russel, LCSW    [facility] Ambulatory Care Coordination Team  Phone: [phone]
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
## 2022-06-10 ENCOUNTER — Ambulatory Visit: Admit: 2022-06-10 | Discharge: 2022-06-10 | Payer: MEDICARE | Attending: Internal Medicine | Primary: Internal Medicine

## 2022-06-10 DIAGNOSIS — E119 Type 2 diabetes mellitus without complications: Secondary | ICD-10-CM

## 2022-06-10 NOTE — Progress Notes (Signed)
Jamie Bryant is a 81 y.o. female  Chief Complaint   Patient presents with    Establish Care     Patient here to Establish Care. Former patient of Dr. Mallie Snooks.          YES Answers must have Comments  1. "Have you been to the ER, urgent care clinic since your last visit?  Hospitalized since your last visit?"    [x]  YES When: April 2023 Where: [facility] Reason for visit: Allergic reaction  []  NO       2. "Have you seen or consulted any other health care providers outside of [facility] since your last visit?"    []  YES   [x]  NO       3.  For patients aged 77-75: "Have you had a colorectal cancer screening such as a colonoscopy/FIT/Cologuard?  Nurse/CMA to request records if not in chart   []  YES   []  NO   [x]  NA, based on age    If the patient is female:      4. For female patients aged 72-74: "Have you had a mammogram in the last two years?" Nurse/CMA to request records if not in chart   []  YES   []  NO   [x]  NA, based on age    73.  For female patients aged 21-65: "Have you had a pap smear?"  Nurse/CMA to request records if not in chart   []  YES   []  NO  [x]  NA, based on age

## 2022-06-11 LAB — URINALYSIS WITH MICROSCOPIC
Bilirubin Urine: NEGATIVE
Blood, Urine: NEGATIVE
Glucose, UA: NEGATIVE mg/dL
Ketones, Urine: NEGATIVE mg/dL
Leukocyte Esterase, Urine: NEGATIVE
Nitrite, Urine: POSITIVE — AB
Protein, UA: NEGATIVE mg/dL
Specific Gravity, UA: 1.019 (ref 1.003–1.030)
Urobilinogen, Urine: 0.2 EU/dL (ref 0.2–1.0)
pH, Urine: 6.5 (ref 5.0–8.0)

## 2022-06-11 LAB — LIPID PANEL
Chol/HDL Ratio: 4.1 (ref 0.0–5.0)
Cholesterol, Total: 196 MG/DL (ref <200)
HDL: 48 MG/DL
LDL Calculated: 119.4 MG/DL — ABNORMAL HIGH (ref 0–100)
Triglycerides: 143 MG/DL (ref <150)
VLDL Cholesterol Calculated: 28.6 MG/DL

## 2022-06-11 LAB — HEMOGLOBIN A1C
Estimated Avg Glucose: 148 mg/dL
Hemoglobin A1C: 6.8 % — ABNORMAL HIGH (ref 4.0–5.6)

## 2022-06-11 LAB — MICROALBUMIN / CREATININE URINE RATIO
Creatinine, Ur: 96.2 mg/dL
Microalb, Ur: 2.36 mg/dL
Microalb/Creat Ratio: 25 mg/g (ref 0–30)

## 2022-06-11 LAB — TSH: TSH, 3rd Generation: 0.88 u[IU]/mL (ref 0.36–3.74)

## 2022-06-11 LAB — HIGH SENSITIVITY CRP: CRP, High Sensitivity: >9.5 mg/L

## 2022-06-12 LAB — APOLIPOPROTEIN, B-100: Apolipoprotein B: 113 mg/dL — ABNORMAL HIGH (ref <90)

## 2022-06-15 NOTE — Progress Notes (Signed)
[facility]  [address]  [address] [place] [address]  Phone:  [phone]  Fax: [phone]       Chief Complaint   Patient presents with    Establish Care     Patient here to Establish Care. Former patient of Dr. Mallie Snooks.    .      SUBJECTIVE:    Jamie Bryant is a 81 y.o. female  Dictation on: 06/15/2022  7:41 PM by: Vianne Bulls [phone]          Current Outpatient Medications   Medication Sig Dispense Refill    pregabalin (LYRICA) 300 MG capsule pregabalin 300 mg capsule      REPATHA SURECLICK 140 MG/ML SOAJ INJECT AS DIRECTED EVERY 2 WEEKS      insulin lispro, 1 Unit Dial, (HUMALOG/ADMELOG) 100 UNIT/ML SOPN Humalog KwikPen (U-100) Insulin 100 unit/mL subcutaneous   INJECT UP TO 50 UNITS UNDER THE SKIN DAILY AS DIRECTED      ipratropium (ATROVENT HFA) 17 MCG/ACT inhaler Atrovent HFA 17 mcg/actuation aerosol inhaler      quinapril (ACCUPRIL) 40 MG tablet quinapril 40 mg tablet   TAKE 1 TABLET BY MOUTH EVERY NIGHT FOR HIGH BLOOD PRESSURE      aspirin 81 MG chewable tablet Take 1 tablet by mouth daily      vitamin D3 (CHOLECALCIFEROL) 125 MCG (5000 UT) TABS tablet Take by mouth daily      coenzyme Q10 100 MG CAPS capsule Take 1 capsule by mouth daily      linaclotide (LINZESS) 290 MCG CAPS capsule Take by mouth every morning (before breakfast)      olopatadine (PATANOL) 0.1 % ophthalmic solution Apply 2 drops to eye 2 times daily      omeprazole (PRILOSEC) 40 MG delayed release capsule Take 1 capsule by mouth daily      SITagliptin (JANUVIA) 100 MG tablet Take 1 tablet by mouth daily      estradiol (ESTRACE) 1 MG tablet Take 1 tablet by mouth daily (Patient not taking: Reported on 06/10/2022)      metoprolol tartrate (LOPRESSOR) 50 MG tablet Take 1 tablet by mouth 2 times daily (Patient not taking: Reported on 06/10/2022)       No current facility-administered medications for this visit.     Past Medical History:   Diagnosis Date    Arthritis     Asthma     Depression      Diabetes (HCC)     Fibromyalgia     Fibromyalgia     Gastroparesis     Hyperlipemia     Hypertension     Neuropathy     Psychotic disorder (HCC)     Stroke [facility]      Past Surgical History:   Procedure Laterality Date    COLONOSCOPY      HYSTERECTOMY (CERVIX STATUS UNKNOWN)       Allergies   Allergen Reactions    Statins Myalgia     Lovastatin, pravastatin, simvastatin, atorvastatin, zetia, livalo, lovaza    Ampicillin Rash    Lidocaine Rash         REVIEW OF SYSTEMS:  General: negative for - chills or fever  ENT: negative for - headaches, nasal congestion or tinnitus  Respiratory: negative for - cough, hemoptysis, shortness of breath or wheezing  Cardiovascular : negative for - chest pain, edema, palpitations or shortness of breath  Gastrointestinal: negative for - abdominal pain, blood in  stools, heartburn or nausea/vomiting  Genito-Urinary: no dysuria, trouble voiding, or hematuria  Musculoskeletal: negative for - gait disturbance, joint pain, joint stiffness or joint swelling  Neurological: no TIA or stroke symptoms  Hematologic: no bruises, no bleeding, no swollen glands  Integument: no lumps, mole changes, nail changes or rash  Endocrine: no malaise/lethargy or unexpected weight changes      Social History     Socioeconomic History    Marital status: Divorced     Spouse name: None    Number of children: 2    Years of education: None    Highest education level: None   Tobacco Use    Smoking status: Never     Passive exposure: Never    Smokeless tobacco: Never   Vaping Use    Vaping Use: Never used   Substance and Sexual Activity    Alcohol use: No    Drug use: No    Sexual activity: Not Currently     Comment: devorced,2 children,retired,living in [facility] adult living   Social History Narrative    Nothing in the past 5 years     Social Determinants of Health     Financial Resource Strain: Low Risk     Difficulty of Paying Living Expenses: Not hard at all   Food Insecurity: No Food Insecurity     Worried About Programme researcher, broadcasting/film/video in the Last Year: Never true    Barista in the Last Year: Never true   Transportation Needs: No Transportation Needs    Lack of Transportation (Medical): No    Lack of Transportation (Non-Medical): No   Physical Activity: Inactive    Days of Exercise per Week: 0 days    Minutes of Exercise per Session: 0 min   Stress: Stress Concern Present    Feeling of Stress : To some extent   Social Connections: Socially Isolated    Frequency of Communication with Friends and Family: More than three times a week    Frequency of Social Gatherings with Friends and Family: Never    Attends Religious Services: Never    Diplomatic Services operational officer: No    Attends Engineer, structural: Never    Marital Status: Divorced   Catering manager Violence: Not At Risk    Fear of Current or Ex-Partner: No    Emotionally Abused: No    Physically Abused: No    Sexually Abused: No   Housing Stability: Low Risk     Unable to Pay for Housing in the Last Year: No    Number of Places Lived in the Last Year: 0    Unstable Housing in the Last Year: No     Family History   Problem Relation Age of Onset    Breast Cancer Maternal Grandmother     Cancer Father         lung ca    Hypertension Father     Diabetes Mother     Hypertension Mother        OBJECTIVE:    BP (!) 159/78 (Site: Left Upper Arm, Position: Sitting, Cuff Size: Medium Adult)   Pulse 82   Temp 98.1 F (36.7 C) (Oral)   Resp 18   Ht 5\' 4"  (1.626 m)   Wt 213 lb 3.2 oz (96.7 kg)   SpO2 97%   BMI 36.60 kg/m   CONSTITUTIONAL: well , well nourished, appears age appropriate  EYES: perrla, eom intact  ENMT:moist  mucous membranes, pharynx clear  NECK: supple. Thyroid normal  RESPIRATORY: Chest: clear to ascultation and percussion   CARDIOVASCULAR: Heart: regular rate and rhythm  GASTROINTESTINAL: Abdomen: soft, bowel sounds active  HEMATOLOGIC: no pathological lymph nodes palpated  MUSCULOSKELETAL: Extremities: no edema, pulse 1+    INTEGUMENT: No unusual rashes or suspicious skin lesions noted. Nails appear normal.  NEUROLOGIC: non-focal exam   MENTAL STATUS: alert and oriented, appropriate affect      ASSESSMENT:  1. Type 2 diabetes mellitus without complication, without long-term current use of insulin (HCC)    2. Polyneuropathy    3. Bilateral carotid bruits    4. Obstructive sleep apnea    5. Mild intermittent asthma without complication    6. Dyslipidemia    7. Essential hypertension, benign        PLAN:   Dictation on: 06/15/2022  7:44 PM by: Arbutus Ped A [phone]     Orders Placed This Encounter   Procedures    Hemoglobin A1C     Standing Status:   Future     Number of Occurrences:   1     Standing Expiration Date:   06/11/2023    Microalbumin / Creatinine Urine Ratio     Standing Status:   Future     Number of Occurrences:   1     Standing Expiration Date:   06/11/2023    High sensitivity CRP     Standing Status:   Future     Number of Occurrences:   1     Standing Expiration Date:   06/11/2023    Lipid Panel     Standing Status:   Future     Number of Occurrences:   1     Standing Expiration Date:   06/11/2023    TSH     Standing Status:   Future     Number of Occurrences:   1     Standing Expiration Date:   06/11/2023    Urinalysis with Microscopic     Standing Status:   Future     Number of Occurrences:   1     Standing Expiration Date:   06/11/2023     Order Specific Question:   SPECIFY(EX-CATH,MIDSTREAM,CYSTO,ETC)?     Answer:   midstream    Apolipoprotein B-100        Follow-up and Dispositions    Return in about 4 weeks (around 07/08/2022).             Arbutus Ped, MD

## 2022-06-15 NOTE — Patient Instructions (Signed)
[id number]

## 2022-06-16 ENCOUNTER — Encounter

## 2022-06-16 NOTE — Telephone Encounter (Signed)
Okay to refill lyrica now

## 2022-06-17 MED ORDER — PREGABALIN 300 MG PO CAPS
300 MG | ORAL_CAPSULE | ORAL | 3 refills | Status: AC
Start: 2022-06-17 — End: 2022-11-16

## 2022-06-17 MED ORDER — QUINAPRIL HCL 40 MG PO TABS
40 MG | ORAL_TABLET | ORAL | 3 refills | Status: AC
Start: 2022-06-17 — End: ?

## 2022-06-17 MED ORDER — SITAGLIPTIN PHOSPHATE 100 MG PO TABS
100 MG | ORAL_TABLET | Freq: Every day | ORAL | 11 refills | Status: AC
Start: 2022-06-17 — End: 2022-06-25

## 2022-06-17 MED ORDER — REPATHA SURECLICK 140 MG/ML SC SOAJ
140 MG/ML | SUBCUTANEOUS | 11 refills | Status: DC
Start: 2022-06-17 — End: 2023-08-04

## 2022-06-17 NOTE — Telephone Encounter (Signed)
Patient states she has a endocrinologist that follows her diabetes but she needs the [place]

## 2022-06-25 MED ORDER — SITAGLIPTIN PHOSPHATE 100 MG PO TABS
100 MG | ORAL_TABLET | Freq: Every day | ORAL | 3 refills | Status: AC
Start: 2022-06-25 — End: 2023-08-07

## 2022-06-25 NOTE — Telephone Encounter (Signed)
Quinapril accupril  not available

## 2022-06-26 MED ORDER — OLMESARTAN MEDOXOMIL 20 MG PO TABS
20 MG | ORAL_TABLET | ORAL | 11 refills | Status: AC
Start: 2022-06-26 — End: 2022-12-17

## 2022-08-06 ENCOUNTER — Ambulatory Visit: Admit: 2022-08-06 | Discharge: 2022-08-06 | Payer: MEDICARE | Attending: Internal Medicine | Primary: Internal Medicine

## 2022-08-06 DIAGNOSIS — E119 Type 2 diabetes mellitus without complications: Secondary | ICD-10-CM

## 2022-08-06 NOTE — Progress Notes (Signed)
Jamie Bryant is a 81 y.o. female  Chief Complaint   Patient presents with    Medicare AWV     Patient here for Annual Medicare Wellness Exam.  Patient reports she feels like she had a stroke 2 days ago and she did not seek medical attention.          YES Answers must have Comments  1. "Have you been to the ER, urgent care clinic since your last visit?  Hospitalized since your last visit?"    []  YES   [x]  NO       2. "Have you seen or consulted any other health care providers outside of [facility] since your last visit?"    []  YES   [x]  NO       3.  For patients aged 59-75: "Have you had a colorectal cancer screening such as a colonoscopy/FIT/Cologuard?  Nurse/CMA to request records if not in chart   []  YES   []  NO   [x]  NA, based on age    If the patient is female:      4. For female patients aged 84-74: "Have you had a mammogram in the last two years?" Nurse/CMA to request records if not in chart   []  YES   []  NO   [x]  NA, based on age    22.  For female patients aged 21-65: "Have you had a pap smear?"  Nurse/CMA to request records if not in chart   []  YES   []  NO  [x]  NA, based on age

## 2022-08-07 LAB — HEMOGLOBIN A1C
Hemoglobin A1C: 6.2 % — ABNORMAL HIGH (ref 4.0–5.6)
eAG: 131 mg/dL

## 2022-08-07 LAB — EXTRA TUBES HOLD

## 2022-08-08 LAB — BASIC METABOLIC PANEL
Anion Gap: 4 mmol/L — ABNORMAL LOW (ref 5–15)
BUN: 28 MG/DL — ABNORMAL HIGH (ref 6–20)
Bun/Cre Ratio: 28 — ABNORMAL HIGH (ref 12–20)
CO2: 28 mmol/L (ref 21–32)
Calcium: 9.3 MG/DL (ref 8.5–10.1)
Chloride: 109 mmol/L — ABNORMAL HIGH (ref 97–108)
Creatinine: 1 MG/DL (ref 0.55–1.02)
Est, Glom Filt Rate: 58 mL/min/{1.73_m2} — ABNORMAL LOW (ref >60)
Glucose: 129 mg/dL — ABNORMAL HIGH (ref 65–100)
Potassium: 4.2 mmol/L (ref 3.5–5.1)
Sodium: 141 mmol/L (ref 136–145)

## 2022-08-08 LAB — APOLIPOPROTEIN, B-100: Apolipoprotein B: 82 mg/dL (ref <90)

## 2022-08-08 LAB — HIGH SENSITIVITY CRP: CRP, High Sensitivity: >9.5 mg/L

## 2022-08-13 NOTE — Progress Notes (Signed)
[facility]  [address]  [address]  [place] [address]  Phone:  [phone]  Fax: [phone]       Chief Complaint   Patient presents with    Medicare AWV     Patient here for Annual Medicare Wellness Exam.  Patient reports she feels like she had a stroke 2 days ago and she did not seek medical attention. She also reports has bilateral leg swelling. She also reports that her neighbor has a decimal machine and she thinks this is the cause of her leg issues.    .      SUBJECTIVE:    Jamie Bryant is a 81 y.o. female  Dictation on: 08/13/2022 11:10 PM by: Vianne Bulls [phone]          Current Outpatient Medications   Medication Sig Dispense Refill    SITagliptin (JANUVIA) 100 MG tablet Take 1 tablet by mouth daily 90 tablet 3    olmesartan (BENICAR) 20 MG tablet Take 1 tablet daily x 1 week, then 2 tabs daily thereafter 60 tablet 11    quinapril (ACCUPRIL) 40 MG tablet quinapril 40 mg tablet   TAKE 1 TABLET BY MOUTH EVERY NIGHT FOR HIGH BLOOD PRESSURE 90 tablet 3    REPATHA SURECLICK 140 MG/ML SOAJ INJECT AS DIRECTED EVERY 2 WEEKS 2 Adjustable Dose Pre-filled Pen Syringe 11    insulin lispro, 1 Unit Dial, (HUMALOG/ADMELOG) 100 UNIT/ML SOPN Humalog KwikPen (U-100) Insulin 100 unit/mL subcutaneous   INJECT UP TO 50 UNITS UNDER THE SKIN DAILY AS DIRECTED      ipratropium (ATROVENT HFA) 17 MCG/ACT inhaler Atrovent HFA 17 mcg/actuation aerosol inhaler      aspirin 81 MG chewable tablet Take 1 tablet by mouth daily      vitamin D3 (CHOLECALCIFEROL) 125 MCG (5000 UT) TABS tablet Take by mouth daily      coenzyme Q10 100 MG CAPS capsule Take 1 capsule by mouth daily      linaclotide (LINZESS) 290 MCG CAPS capsule Take by mouth every morning (before breakfast)      olopatadine (PATANOL) 0.1 % ophthalmic solution Apply 2 drops to eye 2 times daily      omeprazole (PRILOSEC) 40 MG delayed release capsule Take 1 capsule by mouth daily      pregabalin (LYRICA) 300 MG capsule Take 1  capsule by mouth 2 times daily.      estradiol (ESTRACE) 1 MG tablet Take 1 tablet by mouth daily (Patient not taking: Reported on 06/10/2022)      metoprolol tartrate (LOPRESSOR) 50 MG tablet Take 1 tablet by mouth 2 times daily (Patient not taking: Reported on 06/10/2022)       No current facility-administered medications for this visit.     Past Medical History:   Diagnosis Date    Arthritis     Asthma     Depression     Diabetes (HCC)     Fibromyalgia     Fibromyalgia     Gastroparesis     Hyperlipemia     Hypertension     Neuropathy     Psychotic disorder (HCC)     Stroke [facility])      Past Surgical History:   Procedure Laterality Date    COLONOSCOPY      HYSTERECTOMY (CERVIX STATUS UNKNOWN)       Allergies   Allergen Reactions    Statins Myalgia     Lovastatin, pravastatin, simvastatin, atorvastatin,  zetia, livalo, lovaza    Ampicillin Rash    Lidocaine Rash         REVIEW OF SYSTEMS:  General: negative for - chills or fever  ENT: negative for - headaches, nasal congestion or tinnitus  Respiratory: negative for - cough, hemoptysis, shortness of breath or wheezing  Cardiovascular : negative for - chest pain, edema, palpitations or shortness of breath  Gastrointestinal: negative for - abdominal pain, blood in stools, heartburn or nausea/vomiting  Genito-Urinary: no dysuria, trouble voiding, or hematuria  Musculoskeletal: negative for - gait disturbance, joint pain, joint stiffness or joint swelling  Neurological: no TIA or stroke symptoms  Hematologic: no bruises, no bleeding, no swollen glands  Integument: no lumps, mole changes, nail changes or rash  Endocrine: no malaise/lethargy or unexpected weight changes      Social History     Socioeconomic History    Marital status: Divorced     Spouse name: None    Number of children: 2    Years of education: None    Highest education level: Bachelor's degree (e.g., BA, AB, BS)   Tobacco Use    Smoking status: Never     Passive exposure: Never    Smokeless tobacco: Never    Vaping Use    Vaping Use: Never used   Substance and Sexual Activity    Alcohol use: No    Drug use: No    Sexual activity: Not Currently     Comment: devorced,2 children,retired,living in [facility]   Social History Narrative    Nothing in the past 5 years     Social Determinants of Health     Financial Resource Strain: Low Risk     Difficulty of Paying Living Expenses: Not hard at all   Food Insecurity: No Food Insecurity    Worried About Programme researcher, broadcasting/film/video in the Last Year: Never true    Barista in the Last Year: Never true   Transportation Needs: No Transportation Needs    Lack of Transportation (Medical): No    Lack of Transportation (Non-Medical): No   Physical Activity: Inactive    Days of Exercise per Week: 0 days    Minutes of Exercise per Session: 0 min   Stress: Stress Concern Present    Feeling of Stress : Very much   Social Connections: Moderately Isolated    Frequency of Communication with Friends and Family: More than three times a week    Frequency of Social Gatherings with Friends and Family: Never    Attends Religious Services: Never    Database administrator or Organizations: Yes    Attends Banker Meetings: Never    Marital Status: Divorced   Catering manager Violence: Not At Risk    Fear of Current or Ex-Partner: No    Emotionally Abused: No    Physically Abused: No    Sexually Abused: No   Housing Stability: Low Risk     Unable to Pay for Housing in the Last Year: No    Number of Places Lived in the Last Year: 1    Unstable Housing in the Last Year: No     Family History   Problem Relation Age of Onset    Breast Cancer Maternal Grandmother     Cancer Father         lung ca    Hypertension Father     Diabetes Mother     Hypertension Mother  OBJECTIVE:    BP 104/61 (Site: Left Upper Arm, Position: Sitting, Cuff Size: Medium Adult)   Pulse 95   Temp 98.5 F (36.9 C) (Oral)   Resp 18   Ht 5\' 4"  (1.626 m)   Wt 228 lb 3.2 oz (103.5 kg)   SpO2 95%    BMI 39.17 kg/m   CONSTITUTIONAL: well , well nourished, appears age appropriate  EYES: perrla, eom intact  ENMT:moist mucous membranes, pharynx clear  NECK: supple. Thyroid normal, no JVD or HJR, right carotid bruit  RESPIRATORY: Chest: clear to ascultation and percussion   CARDIOVASCULAR: Heart: regular rate and rhythm  GASTROINTESTINAL: Abdomen: soft, bowel sounds active  HEMATOLOGIC: no pathological lymph nodes palpated  MUSCULOSKELETAL: Extremities: 1+ edema distally, pulse 1+ , pain elicited to flexion and hyperextension left knee, limited range of motion left hip  INTEGUMENT: No unusual rashes or suspicious skin lesions noted. Nails appear normal.  NEUROLOGIC: Slurred speech, mild weakness right upper extremity, slow unsteady gait  MENTAL STATUS: alert and oriented, appropriate affect      ASSESSMENT:  1. Type 2 diabetes mellitus without complication, without long-term current use of insulin (HCC)    2. Dyslipidemia    3. Cerebrovascular accident (CVA), unspecified mechanism (HCC)    4. Venous insufficiency    5. Right carotid bruit    6. Severe obesity (BMI 35.0-39.9) with comorbidity (HCC)    7. Prerenal azotemia    8. Delusion [facility])        PLAN:   Dictation on: 08/13/2022 11:15 PM by: Arbutus Ped A [phone]     Orders Placed This Encounter   Procedures    Hemoglobin A1C     Standing Status:   Future     Number of Occurrences:   1     Standing Expiration Date:   08/07/2023    Basic Metabolic Panel     Standing Status:   Future     Number of Occurrences:   1     Standing Expiration Date:   08/07/2023    Apolipoprotein B-100     Standing Status:   Future     Number of Occurrences:   1     Standing Expiration Date:   08/07/2023    High sensitivity CRP     Standing Status:   Future     Number of Occurrences:   1     Standing Expiration Date:   08/07/2023    Extra Tubes Hold                Arbutus Ped, MD      Medicare Annual Wellness Visit    Jamie Bryant is here for Medicare AWV (Patient here for Annual Medicare  Wellness Exam.  Patient reports she feels like she had a stroke 2 days ago and she did not seek medical attention. She also reports has bilateral leg swelling. She also reports that her neighbor has a decimal machine and she thinks this is the cause of her leg issues. )    Assessment & Plan   Type 2 diabetes mellitus without complication, without long-term current use of insulin (HCC)  -     Hemoglobin A1C; Future  Dyslipidemia  -     Apolipoprotein B-100; Future  -     High sensitivity CRP; Future  Cerebrovascular accident (CVA), unspecified mechanism (HCC)  -     aspirin 81 MG chewable tablet; Take 1 tablet by mouth daily, Disp-30 tablet, R-11Normal  -  vitamin D3 (CHOLECALCIFEROL) 125 MCG (5000 UT) TABS tablet; Take 1 tablet by mouth daily, Disp-30 tablet, R-11Normal  Venous insufficiency  Right carotid bruit  Severe obesity (BMI 35.0-39.9) with comorbidity (HCC)  Prerenal azotemia  -     Basic Metabolic Panel; Future  Delusion (HCC)  Medicare annual wellness visit, subsequent    Recommendations for Preventive Services Due: see orders and patient instructions/AVS.  Recommended screening schedule for the next 5-10 years is provided to the patient in written form: see Patient Instructions/AVS.     Return in 2 months (on 10/06/2022).     Subjective       Patient's complete Health Risk Assessment and screening values have been reviewed and are found in Flowsheets. The following problems were reviewed today and where indicated follow up appointments were made and/or referrals ordered.    Positive Risk Factor Screenings with Interventions:    Fall Risk:  Do you feel unsteady or are you worried about falling? : (!) yes (walker)  2 or more falls in past year?: no  Fall with injury in past year?: no     Interventions:    Patient declines any further evaluation or treatment     Depression:  PHQ-2 Score: 4  PHQ-9 Total Score: 14    Interpretation:   1-4 = minimal  5-9 = mild  10-14 = moderate  15-19 = moderately  severe  20-27 = severe  Interventions:  Psychiatric intervention suggested and the patient states that she will consider this            Weight and Activity:  Physical Activity: Inactive    Days of Exercise per Week: 0 days    Minutes of Exercise per Session: 0 min     On average, how many days per week do you engage in moderate to strenuous exercise (like a brisk walk)?: 0 days     Body mass index is 39.17 kg/m. (!) Abnormal    Inactivity Interventions:  Patient declined any further interventions or treatment  Obesity Interventions:  Suggest that she eat meals, limiting snacks, and avoid the consumption of processed carbohydrates                               Objective   Vitals:    08/06/22 1500   BP: 104/61   Site: Left Upper Arm   Position: Sitting   Cuff Size: Medium Adult   Pulse: 95   Resp: 18   Temp: 98.5 F (36.9 C)   TempSrc: Oral   SpO2: 95%   Weight: 228 lb 3.2 oz (103.5 kg)   Height: 5\' 4"  (1.626 m)      Body mass index is 39.17 kg/m.               Allergies   Allergen Reactions    Statins Myalgia     Lovastatin, pravastatin, simvastatin, atorvastatin, zetia, livalo, lovaza    Ampicillin Rash    Lidocaine Rash     Prior to Visit Medications    Medication Sig Taking? Authorizing Provider   aspirin 81 MG chewable tablet Take 1 tablet by mouth daily Yes Vianne Bulls, MD   vitamin D3 (CHOLECALCIFEROL) 125 MCG (5000 UT) TABS tablet Take 1 tablet by mouth daily Yes Vianne Bulls, MD   SITagliptin (JANUVIA) 100 MG tablet Take 1 tablet by mouth daily Yes Vianne Bulls, MD   olmesartan (BENICAR) 20 MG tablet  Take 1 tablet daily x 1 week, then 2 tabs daily thereafter Yes Vianne Bulls, MD   quinapril (ACCUPRIL) 40 MG tablet quinapril 40 mg tablet   TAKE 1 TABLET BY MOUTH EVERY NIGHT FOR HIGH BLOOD PRESSURE Yes Vianne Bulls, MD   REPATHA SURECLICK 140 MG/ML SOAJ INJECT AS DIRECTED EVERY 2 WEEKS Yes Vianne Bulls, MD   insulin lispro, 1 Unit Dial, (HUMALOG/ADMELOG) 100 UNIT/ML SOPN Humalog KwikPen (U-100)  Insulin 100 unit/mL subcutaneous   INJECT UP TO 50 UNITS UNDER THE SKIN DAILY AS DIRECTED Yes Historical Provider, MD   ipratropium (ATROVENT HFA) 17 MCG/ACT inhaler Atrovent HFA 17 mcg/actuation aerosol inhaler Yes Historical Provider, MD   coenzyme Q10 100 MG CAPS capsule Take 1 capsule by mouth daily Yes Ar Automatic Reconciliation   linaclotide (LINZESS) 290 MCG CAPS capsule Take by mouth every morning (before breakfast) Yes Ar Automatic Reconciliation   olopatadine (PATANOL) 0.1 % ophthalmic solution Apply 2 drops to eye 2 times daily Yes Ar Automatic Reconciliation   omeprazole (PRILOSEC) 40 MG delayed release capsule Take 1 capsule by mouth daily Yes Ar Automatic Reconciliation   pregabalin (LYRICA) 300 MG capsule Take 1 capsule by mouth 2 times daily. Yes Ar Automatic Reconciliation   estradiol (ESTRACE) 1 MG tablet Take 1 tablet by mouth daily  Patient not taking: Reported on 06/10/2022  Ar Automatic Reconciliation   metoprolol tartrate (LOPRESSOR) 50 MG tablet Take 1 tablet by mouth 2 times daily  Patient not taking: Reported on 06/10/2022  Ar Automatic Reconciliation       CareTeam (Including outside providers/suppliers regularly involved in providing care):   Patient Care Team:  Vianne Bulls, MD as PCP - General (Internal Medicine)  Vianne Bulls, MD as PCP - Empaneled Provider     Reviewed and updated this visit:  Tobacco  Allergies  Meds  Med Hx  Surg Hx  Soc Hx  Fam Hx

## 2022-08-13 NOTE — Patient Instructions (Signed)
Preventing Falls: Care Instructions    Talk to your doctor about the medicines you take. Ask if any of them increase the risk of falls and whether they can be changed or stopped.   Try to exercise regularly. It can help improve your strength and balance. This can help lower your risk of falling.     Practice fall safety and prevention.    Wear low-heeled shoes that fit well and give your feet good support. Talk to your doctor if you have foot problems that make this hard.  Carry a cellphone or wear a medical alert device that you can use to call for help.  Use stepladders instead of chairs to reach high objects. Don't climb if you're at risk for falls. Ask for help, if needed.  Wear the correct eyeglasses, if you need them.    Make your home safer.    Remove rugs, cords, clutter, and furniture from walkways.  Keep your house well lit. Use night-lights in hallways and bathrooms.  Install and use sturdy handrails on stairways.  Wear nonskid footwear, even inside. Don't walk barefoot or in socks without shoes.    Be safe outside.    Use handrails, curb cuts, and ramps whenever possible.  Keep your hands free by using a shoulder bag or backpack.  Try to walk in well-lit areas. Watch out for uneven ground, changes in pavement, and debris.  Be careful in the winter. Walk on the grass or gravel when sidewalks are slippery. Use de-icer on steps and walkways. Add non-slip devices to shoes.    Put grab bars and nonskid mats in your shower or tub and near the toilet. Try to use a shower chair or bath bench when bathing.   Get into a tub or shower by putting in your weaker leg first. Get out with your strong side first. Have a phone or medical alert device in the bathroom with you.   Where can you learn more?  Go to https://www.bennett.info/ and enter G117 to learn more about "Preventing Falls: Care Instructions."  Current as of: November 9, 2022Content Version: 13.7   2006-2023 Healthwise,  Incorporated.   Care instructions adapted under license by [facility]. If you have questions about a medical condition or this instruction, always ask your healthcare professional. [place] any warranty or liability for your use of this information.           Learning About Mindfulness for Stress  What are mindfulness and stress?     Stress is your body's response to a hard situation. Your body can have a physical, emotional, or mental response. A lot of things can cause stress. You may feel stress when you go on a job interview, take a test, or run a race. This kind of short-term stress is normal and even useful. It can help you if you need to work hard or react quickly.  Stress also can last a long time. Long-term stress is caused by stressful situations or events. Examples of long-term stress include long-term health problems, ongoing problems at work, and conflicts in your family. Long-term stress can harm your health.  Mindfulness is a focus only on things happening in the present moment. It's a process of purposefully paying attention to and being aware of your surroundings, your emotions, your thoughts, and how your body feels. You are aware of these things, but you aren't judging these experiences as "good" or "bad." Mindfulness can help you learn to calm your mind  and body to help you cope with illness, pain, and stress.  How does mindfulness help to relieve stress?  Mindfulness can help quiet your mind and relax your body. Studies show that it can help some people sleep better, feel less anxious, and bring their blood pressure down. And it's been shown to help some people live and cope better with certain health problems like heart disease, depression, chronic pain, and cancer.  How do you practice mindfulness?  To be mindful is to pay attention, to be present, and to be accepting. Like any new skill or habit, being mindful can take practice.  When you're mindful, you do just  one thing and you pay close attention to that one thing. For example, you may sit quietly and notice your emotions or how your food tastes and smells.  When you're present, you focus on the things that are happening right now. You let go of your thoughts about the past and the future. When you dwell on the past or the future, you miss moments that can heal and strengthen you. You may miss moments like hearing a child laugh or seeing a friendly face when you think you're all alone.  When you're accepting, you don't judge the present moment. Instead you accept your thoughts and feelings as they come.  You can practice anytime, anywhere, and in any way you choose. You can practice in many ways. Here are a few ideas:  While doing your chores, like washing the dishes, let your mind focus on what's in your hand. What does the dish feel like? Is the water warm or cold?  Go outside and take a few deep breaths. What is the air like? Is it warm or cold?  When you can, take some time at the start of your day to sit alone and think.  Take a slow walk by yourself. Count your steps while you breathe in and out.  Try yoga breathing exercises, stretches, and poses to strengthen and relax your muscles.  At work, if you can, try to stop for a few moments each hour. Note how your body feels. Let yourself regroup and let your mind settle before you return to what you were doing.  If you struggle with anxiety or "worry thoughts," imagine your mind as a blue sky and your worry thoughts as clouds. Now imagine those worry thoughts floating across your mind's sky. Just let them pass by as you watch.  Follow-up care is a key part of your treatment and safety. Be sure to make and go to all appointments, and call your doctor if you are having problems. It's also a good idea to know your test results and keep a list of the medicines you take.  Where can you learn more?  Go to https://www.bennett.info/ and enter M676 to learn more  about "Learning About Mindfulness for Stress."  Current as of: October 20, 2022Content Version: 13.7   2006-2023 Healthwise, Incorporated.   Care instructions adapted under license by [facility]. If you have questions about a medical condition or this instruction, always ask your healthcare professional. [place] any warranty or liability for your use of this information.           Learning About Being Active as an Older Adult  Why is being active important as you get older?     Being active is one of the best things you can do for your health. And it's never too late to start. Being  active--or getting active, if you aren't already--has definite benefits. It can:  Give you more energy,  Keep your mind sharp.  Improve balance to reduce your risk of falls.  Help you manage chronic illness with fewer medicines.  No matter how old you are, how fit you are, or what health problems you have, there is a form of activity that will work for you. And the more physical activity you can do, the better your overall health will be.  What kinds of activity can help you stay healthy?  Being more active will make your daily activities easier. Physical activity includes planned exercise and things you do in daily life. There are four types of activity:  Aerobic.  Doing aerobic activity makes your heart and lungs strong.  Includes walking, dancing, and gardening.  Aim for at least 2 hours spread throughout the week.  It improves your energy and can help you sleep better.  Muscle-strengthening.  This type of activity can help maintain muscle and strengthen bones.  Includes climbing stairs, using resistance bands, and lifting or carrying heavy loads.  Aim for at least twice a week.  It can help protect the knees and other joints.  Stretching.  Stretching gives you better range of motion in joints and muscles.  Includes upper arm stretches, calf stretches, and gentle yoga.  Aim for at least  twice a week, preferably after your muscles are warmed up from other activities.  It can help you function better in daily life.  Balancing.  This helps you stay coordinated and have good posture.  Includes heel-to-toe walking, tai chi, and certain types of yoga.  Aim for at least 3 days a week.  It can reduce your risk of falling.  Even if you have a hard time meeting the recommendations, it's better to be more active than less active. All activity done in each category counts toward your weekly total. You'd be surprised how daily things like carrying groceries, keeping up with grandchildren, and taking the stairs can add up.  What keeps you from being active?  If you've had a hard time being more active, you're not alone. Maybe you remember being able to do more. Or maybe you've never thought of yourself as being active. It's frustrating when you can't do the things you want. Being more active can help. What's holding you back?  Getting started.  Have a goal, but break it into easy tasks. Small steps build into big accomplishments.  Staying motivated.  If you feel like skipping your activity, remember your goal. Maybe you want to move better and stay independent. Every activity gets you one step closer.  Not feeling your best.  Start with 5 minutes of an activity you enjoy. Prove to yourself you can do it. As you get comfortable, increase your time.  You may not be where you want to be. But you're in the process of getting there. Everyone starts somewhere.  How can you find safe ways to stay active?  Talk with your doctor about any physical challenges you're facing. Make a plan with your doctor if you have a health problem or aren't sure how to get started with activity.  If you're already active, ask your doctor if there is anything you should change to stay safe as your body and health change.  If you tend to feel dizzy after you take medicine, avoid activity at that time. Try being active before you take your  medicine. This will reduce your  risk of falls.  If you plan to be active at home, make sure to clear your space before you get started. Remove things like TV cords, coffee tables, and throw rugs. It's safest to have plenty of space to move freely.  The key to getting more active is to take it slow and steady. Try to improve only a little bit at a time. Pick just one area to improve on at first. And if an activity hurts, stop and talk to your doctor.  Where can you learn more?  Go to https://www.bennett.info/ and enter P600 to learn more about "Learning About Being Active as an Older Adult."  Current as of: October 10, 2022Content Version: 13.7   2006-2023 Healthwise, Incorporated.   Care instructions adapted under license by [facility]. If you have questions about a medical condition or this instruction, always ask your healthcare professional. [place] any warranty or liability for your use of this information.           Starting a Weight Loss Plan: Care Instructions  Overview     If you're thinking about losing weight, it can be hard to know where to start. Your doctor can help you set up a weight loss plan that best meets your needs. You may want to take a class on nutrition or exercise, or you could join a weight loss support group. If you have questions about how to make changes to your eating or exercise habits, ask your doctor about seeing a registered dietitian or an exercise specialist.  It can be a big challenge to lose weight. But you don't have to make huge changes at once. Make small changes, and stick with them. When those changes become habit, add a few more changes.  If you don't think you're ready to make changes right now, try to pick a date in the future. Make an appointment to see your doctor to discuss whether the time is right for you to start a plan.  Follow-up care is a key part of your treatment and safety. Be sure to make and go to all  appointments, and call your doctor if you are having problems. It's also a good idea to know your test results and keep a list of the medicines you take.  How can you care for yourself at home?  Set realistic goals. Many people expect to lose much more weight than is likely. A weight loss of 5% to 10% of your body weight may be enough to improve your health.  Get family and friends involved to provide support. Talk to them about why you are trying to lose weight, and ask them to help. They can help by participating in exercise and having meals with you, even if they may be eating something different.  Find what works best for you. If you do not have time or do not like to cook, a program that offers meal replacement bars or shakes may be better for you. Or if you like to prepare meals, finding a plan that includes daily menus and recipes may be best.  Ask your doctor about other health professionals who can help you achieve your weight loss goals.  A dietitian can help you make healthy changes in your diet.  An exercise specialist or personal trainer can help you develop a safe and effective exercise program.  A counselor or psychiatrist can help you cope with issues such as depression, anxiety, or family problems that can make it hard  to focus on weight loss.  Consider joining a support group for people who are trying to lose weight. Your doctor can suggest groups in your area.  Where can you learn more?  Go to https://www.bennett.info/ and enter U357 to learn more about "Starting a Weight Loss Plan: Care Instructions."  Current as of: March 1, 2023Content Version: 13.7   2006-2023 Healthwise, Incorporated.   Care instructions adapted under license by [facility]. If you have questions about a medical condition or this instruction, always ask your healthcare professional. [place] any warranty or liability for your use of this information.           A Healthy  Heart: Care Instructions  Your Care Instructions     Coronary artery disease, also called heart disease, occurs when a substance called plaque builds up in the vessels that supply oxygen-rich blood to your heart muscle. This can narrow the blood vessels and reduce blood flow. A heart attack happens when blood flow is completely blocked. A high-fat diet, smoking, and other factors increase the risk of heart disease.  Your doctor has found that you have a chance of having heart disease. You can do lots of things to keep your heart healthy. It may not be easy, but you can change your diet, exercise more, and quit smoking. These steps really work to lower your chance of heart disease.  Follow-up care is a key part of your treatment and safety. Be sure to make and go to all appointments, and call your doctor if you are having problems. It's also a good idea to know your test results and keep a list of the medicines you take.  How can you care for yourself at home?  Diet   Use less salt when you cook and eat. This helps lower your blood pressure. Taste food before salting. Add only a little salt when you think you need it. With time, your taste buds will adjust to less salt.    Eat fewer snack items, fast foods, canned soups, and other high-salt, high-fat, processed foods.    Read food labels and try to avoid saturated and trans fats. They increase your risk of heart disease by raising cholesterol levels.    Limit the amount of solid fat-butter, margarine, and shortening-you eat. Use olive, peanut, or canola oil when you cook. Bake, broil, and steam foods instead of frying them.    Eat a variety of fruit and vegetables every day. Dark green, deep orange, red, or yellow fruits and vegetables are especially good for you. Examples include spinach, carrots, peaches, and berries.    Foods high in fiber can reduce your cholesterol and provide important vitamins and minerals. High-fiber foods include whole-grain cereals  and breads, oatmeal, beans, brown rice, citrus fruits, and apples.    Eat lean proteins. Heart-healthy proteins include seafood, lean meats and poultry, eggs, beans, peas, nuts, seeds, and soy products.    Limit drinks and foods with added sugar. These include candy, desserts, and soda pop.   Lifestyle changes   If your doctor recommends it, get more exercise. Walking is a good choice. Bit by bit, increase the amount you walk every day. Try for at least 30 minutes on most days of the week. You also may want to swim, bike, or do other activities.    Do not smoke. If you need help quitting, talk to your doctor about stop-smoking programs and medicines. These can increase your chances of quitting for good.  Quitting smoking may be the most important step you can take to protect your heart. It is never too late to quit.    Limit alcohol to 2 drinks a day for men and 1 drink a day for women. Too much alcohol can cause health problems.    Manage other health problems such as diabetes, high blood pressure, and high cholesterol. If you think you may have a problem with alcohol or drug use, talk to your doctor.   Medicines   Take your medicines exactly as prescribed. Call your doctor if you think you are having a problem with your medicine.    If your doctor recommends aspirin, take the amount directed each day. Make sure you take aspirin and not another kind of pain reliever, such as acetaminophen (Tylenol).   When should you call for help?   Call 911 if you have symptoms of a heart attack. These may include:   Chest pain or pressure, or a strange feeling in the chest.    Sweating.    Shortness of breath.    Pain, pressure, or a strange feeling in the back, neck, jaw, or upper belly or in one or both shoulders or arms.    Lightheadedness or sudden weakness.    A fast or irregular heartbeat.   After you call 911, the operator may tell you to chew 1 adult-strength or 2 to 4 low-dose aspirin. Wait for an  ambulance. Do not try to drive yourself.  Watch closely for changes in your health, and be sure to contact your doctor if you have any problems.  Where can you learn more?  Go to https://www.bennett.info/ and enter F075 to learn more about "A Healthy Heart: Care Instructions."  Current as of: September 7, 2022Content Version: 13.7   2006-2023 Healthwise, Incorporated.   Care instructions adapted under license by [facility]. If you have questions about a medical condition or this instruction, always ask your healthcare professional. [place] any warranty or liability for your use of this information.      Personalized Preventive Plan for Jamie Bryant - 08/06/2022  Medicare offers a range of preventive health benefits. Some of the tests and screenings are paid in full while other may be subject to a deductible, co-insurance, and/or copay.    Some of these benefits include a comprehensive review of your medical history including lifestyle, illnesses that may run in your family, and various assessments and screenings as appropriate.    After reviewing your medical record and screening and assessments performed today your provider may have ordered immunizations, labs, imaging, and/or referrals for you.  A list of these orders (if applicable) as well as your Preventive Care list are included within your After Visit Summary for your review.    Other Preventive Recommendations:    A preventive eye exam performed by an eye specialist is recommended every 1-2 years to screen for glaucoma; cataracts, macular degeneration, and other eye disorders.  A preventive dental visit is recommended every 6 months.  Try to get at least 150 minutes of exercise per week or 10,000 steps per day on a pedometer .  Order or download the FREE "Exercise & Physical Activity: Your Everyday Guide" from [employer]. Call (564)715-1598 or search [employer]  online.  You need 1200-1500 mg of calcium and 1000-2000 IU of vitamin D per day. It is possible to meet your calcium requirement with diet alone, but a vitamin D  supplement is usually necessary to meet this goal.  When exposed to the sun, use a sunscreen that protects against both UVA and UVB radiation with an SPF of 30 or greater. Reapply every 2 to 3 hours or after sweating, drying off with a towel, or swimming.  Always wear a seat belt when traveling in a car. Always wear a helmet when riding a bicycle or motorcycle.

## 2022-08-14 MED ORDER — ASPIRIN 81 MG PO CHEW
81 MG | ORAL_TABLET | Freq: Every day | ORAL | 11 refills | Status: AC
Start: 2022-08-14 — End: 2023-11-29

## 2022-08-14 MED ORDER — CHOLECALCIFEROL 125 MCG (5000 UT) PO TABS
125 MCG (5000 UT) | ORAL_TABLET | Freq: Every day | ORAL | 11 refills | Status: DC
Start: 2022-08-14 — End: 2023-11-29

## 2022-08-14 MED ORDER — OMEPRAZOLE 40 MG PO CPDR
40 MG | ORAL_CAPSULE | Freq: Every day | ORAL | 3 refills | Status: AC
Start: 2022-08-14 — End: 2023-06-09

## 2022-08-16 ENCOUNTER — Telehealth

## 2022-08-16 MED ORDER — PREGABALIN 300 MG PO CAPS
300 MG | ORAL_CAPSULE | Freq: Two times a day (BID) | ORAL | 15 refills | Status: AC
Start: 2022-08-16 — End: 2023-11-25

## 2022-08-17 MED ORDER — PREGABALIN 300 MG PO CAPS
300 MG | ORAL_CAPSULE | Freq: Two times a day (BID) | ORAL | 2 refills | Status: DC
Start: 2022-08-17 — End: 2022-08-17

## 2022-08-17 MED ORDER — PREGABALIN 300 MG PO CAPS
300 MG | ORAL_CAPSULE | Freq: Two times a day (BID) | ORAL | 2 refills | Status: AC
Start: 2022-08-17 — End: 2022-11-15

## 2022-08-17 NOTE — Telephone Encounter (Signed)
New admission to [facility], needs pregabalin sent into pharmacy.

## 2022-08-18 ENCOUNTER — Ambulatory Visit: Admit: 2022-08-18 | Payer: MEDICARE | Attending: Family Health | Primary: Internal Medicine

## 2022-08-18 DIAGNOSIS — I872 Venous insufficiency (chronic) (peripheral): Secondary | ICD-10-CM
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
## 2022-08-21 ENCOUNTER — Ambulatory Visit: Admit: 2022-08-21 | Discharge: 2022-08-26 | Payer: MEDICARE | Attending: Internal Medicine | Primary: Internal Medicine

## 2022-08-21 DIAGNOSIS — I872 Venous insufficiency (chronic) (peripheral): Secondary | ICD-10-CM

## 2022-08-25 ENCOUNTER — Ambulatory Visit: Admit: 2022-08-25 | Payer: MEDICARE | Attending: Family Health | Primary: Internal Medicine

## 2022-08-25 DIAGNOSIS — R400 Somnolence: Secondary | ICD-10-CM

## 2022-08-25 NOTE — Progress Notes (Signed)
Subjective:      Patient ID: [facility] is a 81 y.o. female.    [name] currently resides at [facility] under the care of Dr. Dionne Milo following a hospitalization at [facility] for weeping edema to bilateral lower extremities as well as paranoia.  Past medical history is significant for heart failure type II DM hypertension hyperlipidemia depression and dementia. Past medical history is significant for heart failure type II DM hypertension hyperlipidemia depression and dementia.  Visit with this patient on today at the request of nursing who report that the patient has been extremely drowsy recently, sleeping throughout the day.  Sometimes would not wake up to take her medications.  [name] found lying in bed asleep, easily aroused.  Admits that she has been very tired, states that she is unsure of what is going on.  Admits that she does sleep during the day sometimes in her home environment, but this is more significant.  She takes Lyrica 600 mg twice daily, and has done so for a long time.  Only noteworthy new medication is Risperdal 2 mg nightly that was started while inpatient.  She also reports swelling to bilateral lower extremities, legs and feet feel tight.  Taking torsemide 20 mg daily.  Vital signs of been stable.      Review of Systems   Constitutional:  Positive for fatigue.   Respiratory: Negative.     Cardiovascular: Negative.    Neurological: Negative.      Objective:   Physical Exam  Vitals reviewed.   Constitutional:       Appearance: Normal appearance.   Cardiovascular:      Rate and Rhythm: Normal rate and regular rhythm.      Pulses: Normal pulses.      Heart sounds: Normal heart sounds.      Comments: Legs with 2+ non pitting edema, 2+ pitting pedal edema, dorsalis pedis 1+ bilaterally    Pulmonary:      Effort: Pulmonary effort is normal.      Breath sounds: Normal breath sounds.   Neurological:      Mental Status: She is alert and oriented to person, place, and time.   Psychiatric:          Thought Content: Thought content normal.       Assessment / Plan:           1. Drowsiness  Physical exam is reassuring.    High suspicion that respite all contributing to daytime drowsiness, decrease Risperdal to 1 mg nightly.  Obtain stat CBC, BMP, and ammonia.    2. History of delusional disorder  Stable, no delusions at this time.    3. Polyneuropathy  Unlikely that Lyrica contributing to drowsiness as this was a home medication.    4. Essential (primary) hypertension  Hypertension is stable, continue olmesartan.    5. Localized edema  Increase torsemide to 40 mg daily, elevate lower extremities.    Maintain heart healthy diet.  We will continue to monitor.      THIS IS NOT A COMPLETE MEDICAL RECORD ON THIS PATIENT.    THIS IS A PATIENT AT [facility]   PLEASE CONTACT THE FACILITY LISTED BELOW FOR MORE INFORMATION ON THIS PATIENT.    THE COMPLETE RECORD RESIDES WITH THIS LONG TERM CARE FACILITY
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
## 2022-08-27 ENCOUNTER — Ambulatory Visit: Admit: 2022-08-28 | Payer: MEDICARE | Attending: Family Health | Primary: Internal Medicine

## 2022-08-27 DIAGNOSIS — T50905D Adverse effect of unspecified drugs, medicaments and biological substances, subsequent encounter: Secondary | ICD-10-CM

## 2022-08-29 ENCOUNTER — Encounter: Payer: MEDICARE | Attending: Family Health | Primary: Internal Medicine

## 2022-09-01 NOTE — Progress Notes (Signed)
Subjective:      Patient ID: [facility] is a 81 y.o. female.    She currently resides at [facility] under the care of Dr. Dionne Milo. Past medical history is significant for heart failure type II DM hypertension hyperlipidemia depression and dementia.  She was recently seen for lethargy as well as increased swelling to her bilateral lower extremities, Risperdal was decreased from 2 mg nightly to 1 mg nightly.  Torsemide was increased to 40 mg daily.  Labs were stable however ammonia was slightly elevated.  Follow-up visit for reassessment.  Staff also has concerns as patient has packed her entire room and states that she will be discharging to home on today.  Jamie [facility] found standing in her room going through her closet, reports that she is feeling well.  States that she needs to discharge to home.  Admits that she is unable to pay for her apartment as well as skilled nursing facility.      Review of Systems   Constitutional: Negative.    Respiratory: Negative.     Cardiovascular:  Positive for leg swelling.   Gastrointestinal: Negative.    Neurological: Negative.      Objective:   Physical Exam  Vitals reviewed.   Constitutional:       Appearance: She is well-groomed.   Cardiovascular:      Rate and Rhythm: Normal rate and regular rhythm.      Pulses: Normal pulses.      Heart sounds: Normal heart sounds.   Pulmonary:      Effort: Pulmonary effort is normal.      Breath sounds: Normal breath sounds.   Musculoskeletal:      Comments: 1+ nonpitting bilateral lower extremity edema   Neurological:      Mental Status: She is alert.      Comments: Oriented x 2 -3    Psychiatric:         Mood and Affect: Mood normal.       Assessment / Plan:         1. Adverse effect of drug, subsequent encounter  Patient has significantly improved with decrease in Risperdal.  We will continue to monitor.  Repeat labs in 1 week, will follow-up pending results.    2. Localized edema  Improving, continue torsemide 40 mg daily.    3.  Essential (primary) hypertension  Stable, continue carvedilol. Maintain heart healthy diet.                  THIS IS NOT A COMPLETE MEDICAL RECORD ON THIS PATIENT.    THIS IS A PATIENT AT [facility]   PLEASE CONTACT THE FACILITY LISTED BELOW FOR MORE INFORMATION ON THIS PATIENT.    THE COMPLETE RECORD RESIDES WITH THIS LONG TERM CARE FACILITY

## 2022-09-11 ENCOUNTER — Ambulatory Visit: Admit: 2022-09-11 | Payer: MEDICARE | Attending: Internal Medicine | Primary: Internal Medicine

## 2022-09-16 ENCOUNTER — Ambulatory Visit: Payer: MEDICARE | Attending: Internal Medicine | Primary: Internal Medicine

## 2022-09-26 NOTE — Progress Notes (Signed)
THIS IS A PATIENT AT [name] , [address], [place] [address]  THE COMPLETE RECORD RESIDES WITH THIS LONG TERM CARE FACILITY    Jamie [facility] is a 81 year old lady seen at [facility] rehab for follow-up.  Recent hospitalization with bilateral lower extremity edema/venous insufficiency. Was treated with diuretics.  Studies including CXR and echo were stable.  Reports feeling better.  Denies chest pain or significant respiratory issues.  Poor historian with dementia with delusional disorder.   Staff do not report any issues at this point.    PMH: HFpEF 65%, venous insufficiency, HTN, HLD, depression, dementia, anemia, delusional disorder, old CVA, OSA, polyneuropathy, obesity, DM  Allergies: Statins, penicillin, lidocaine  SH: Denies tobacco or alcohol use  Medications: See NH list including Lyrica, omeprazole, aspirin, vitamin D, Januvia, Benicar, metoprolol, Bumex    Exam: Vital signs stable, afebrile  Sitting in chair.  NAD.  Answers most questions properly.  HEENT: Oral mucosa is moist.  Unremarkable.  Neck: Supple without JVD, bruits, thyromegaly  Heart: Distant sounds but regular  Lungs: Diminished sounds at bases otherwise clear  Abdomen: Soft, obese, nontender  Extremities: Mild bilateral pedal edema venous stasis changes  Neuro: Generalized weakness but nonfocal  Skin: Intact.  Warm and dry    Impression:  1.  Pedal edema/venous insufficiency  2.  HFpEF  3.  HTN  4.  DM   5.  Depression  6.  Dementia  7.  Delusional disorder  8.  Anemia  9.  Polyneuropathy  10.  Old CVA  41.  Severe obesity    Plan:  Continue current medications  Continue PT and OT as tolerated  Overall seems stable at this point    I personally spent 15 minutes providing the above care inclusive of: preparation, chart review, charting, examining and counseling patient, and coordinating care. Over 50% of this time was spent in counseling and coordination of care.

## 2022-10-06 ENCOUNTER — Ambulatory Visit: Admit: 2022-10-06 | Discharge: 2022-10-06 | Payer: MEDICARE | Attending: Internal Medicine | Primary: Internal Medicine

## 2022-10-06 DIAGNOSIS — L97929 Non-pressure chronic ulcer of unspecified part of left lower leg with unspecified severity: Secondary | ICD-10-CM
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
## 2022-10-07 ENCOUNTER — Ambulatory Visit: Payer: MEDICARE | Attending: Internal Medicine | Primary: Internal Medicine

## 2022-10-11 LAB — CULTURE, ANAEROBIC AND AEROBIC: Anaerobic Culture: NO GROWTH

## 2022-10-13 ENCOUNTER — Encounter

## 2022-10-13 MED ORDER — CLINDAMYCIN HCL 300 MG PO CAPS
300 MG | ORAL_CAPSULE | Freq: Four times a day (QID) | ORAL | 0 refills | Status: AC
Start: 2022-10-13 — End: 2022-10-23

## 2022-10-14 NOTE — Telephone Encounter (Signed)
Patient notified by phone and ask to pick up rx for clindamycin taking 1 tab tid which we corrected at the pharmacy

## 2022-10-17 NOTE — Progress Notes (Signed)
Formatting of this note is different from the original.  Addendum:   MRI Brain from 2023 reviewed after visit and only old strokes visible that were seen on imaging 2 years prior.     IMPRESSION  Jamie Bryant is a 81 y.o. year old female who returns for follow up. Per neuropsychological testing 10/23/2020, she has prominent frontal-subcortical dysfunction as well as mild parietal and temporal lobe dysfunction. There remain mild features of parkinsonism, especially bradykinesia, but no rigidity or resting tremor. Her gait is shuffling and slow and posture is stooped. 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     She previously failed to tolerate cholinesterase inhibitors. She continues to not recognize that the situation with her neighbor is a delusion. She is not interested in trying medications but is interested in moving. I will have our social worker contact her again about possibility of moving which may be the best intervention that she will agree to.     Since last visit she has had a new brain MRI. She is concerned that it showed strokes. Her 2021 MRI had evidence of old strokes. I will request the early 2023 MRI to review and let her know how it compares to previous.      Diagnosis Plan   1. Dementia associated with other underlying disease with behavioral disturbance (CMS/HCC)       2. Parkinsonism, unspecified Parkinsonism type         RECOMMENDATIONS  Patient Instructions   1. Refer to social worker again about her getting a new apartment.   2. I will review early 2023 MRI at [facility].   3. Follow up 6 months or later in dementia clinic.     TIME/COMMUNICATION  I personally spent a total of 20 minutes today on the patient's encounter. This time was spent reviewing records, discussing the diagnosis and treatment options, counseling/coordination of patient's care as per above, and time spent on documentation.     SUBJECTIVE     CHIEF COMPLAINT & HISTORY OF PRESENT  ILLNESS  Jamie Bryant is a 81 y.o. female seen today in the [facility] for follow-up evaluation.    Last visit: 10/2021    Jamie Bryant is accompanied by: no one.     Movement Disorder Clinical History    Labor day weekend 2020 she acutely developed left-sided weakness, confusion, slurred speech, rolling tongue, headache, and imbalance after nausea/vomiting the day before. She was hospitalized for 6 days. Work-up included CT head and brain MRI. She was told symptoms were related to her heart. She was discharged on blood thinner and blood pressure medication. Since then she has continued to have issues with left hand function. No tremor. Her voice has gotten softer and she has been asked to repeat.      She uses a walker since September 2020. She had falls directly after hospitalization but not recently. Sometimes her left leg gives out. Exam at 04/2020 visit revealed parkinsonian findings but no bradykinesia.      Neuropsychological testing was performed in 10/08/2020. "The pattern of dysfunction observed today reflects global brain dysfunction, with more prominent frontal-subcortical dysfunction as well as mild parietal and temporal lobe dysfunction. These findings were observed in the context of mild to moderate symptoms of depression, presence of delusions, and borderline symptoms of excessive daytime sleepiness and subthreshold insomnia." She was deemed not safe to live on her own at that time. Significant concerns were raised about  her driving.     Previous Relevant Medications:  Galantamine resulted in diarrhea and abdominal discomfort. Memantine had same side effects. Same side effects with Rivastigmine.     Current Relevant Medications:   None. She is not interested in sleep medications.     Updates:     Current Residence/Support: She has daughter in [place]. She is in independent living, same apartment since 2018. Her main concern is neighbor living upstairs. She feels the  neighbor sprays things into her apartment. She keeps scratching because there is itching. Every day there is a different disturbance. She doesn't have enough money to move and hasn't qualified for rent assistance.    Finances: She pays her own bills.   Driving: She is not driving currently. She has friend drive her.    Medication: She keeps track of own medications.   Scheduling: She keeps track of appointments.  Mood: "she does the best she can". The situation with the neighbor is distressing to her.   Sleep: She attributes sleep disruption to neighbor keeping her awake. She thinks when asleep neighbor does more activities.     She had MRI at [facility] in early 2023 and it showed multiple strokes. She is concerned about this.     REVIEW OF SYSTEMS  In addition to the review of systems listed in the HPI, review of systems was also positive for:    She has leg edema and has gone to hospital for this.     PAST MEDICAL HISTORY    Diagnosis Date   ? Asthma    ? Dementia (CMS/HCC)    ? Diabetes mellitus (CMS/HCC)    ? Hypertension      PAST SURGICAL HISTORY  Past Surgical History:   Procedure Laterality Date   ? HYSTERECTOMY     ? OTHER SURGICAL HISTORY      Hemorrhoidectomy-Partial     MEDICATIONS  Current Outpatient Medications on File Prior to Visit   Medication Sig Dispense Refill   ? acetaminophen (Tylenol) 500 MG tablet Take by mouth every 6 hours as needed for mild pain.     ? amLODIPine (Norvasc) 5 MG tablet Take 1 tablet by mouth daily.     ? aspirin 81 MG EC tablet daily.     ? BD Pen Needle Nano 2nd Gen 32G X 4 MM misc USE AS DIRECTED THREE TIMES DAILY  3   ? beclomethasone HFA (Qvar RediHaler) 40 mcg/puff inhaler twice a day.     ? carvedilol (Coreg) 12.5 MG tablet Take 12.5 mg by mouth 2 times daily.     ? cholecalciferol (Vitamin D-3) 125 MCG (5000 UT) capsule Take 1 tablet by mouth daily.     ? coenzyme Q-10 100 MG capsule daily.     ? Daily Multiple Vitamins tablet daily.     ? Evolocumab (Repatha  SureClick) 140 MG/ML solution auto-injector INJECT AS DIRECTED EVERY 2 WEEKS     ? insulin lispro (HumaLOG- 1 unit dial) 100 UNIT/ML injection pen as directed up to 50 units per day     ? linaCLOtide (Linzess) 290 MCG capsule as needed.     ? metoprolol tartrate (Lopressor) 25 MG tablet twice a day.     ? omeprazole (PriLOSEC) 40 MG DR capsule Take 40 mg by mouth daily.     ? pregabalin (Lyrica) 150 MG capsule Take 150 mg by mouth 3 times a day.     ? SITagliptin (Januvia) 100 MG tablet daily.     ?  torsemide (Demadex) 20 MG tablet Take 1 tablet by mouth daily.     ? estradiol (Estrace) 1 MG tablet Take 1 mg by mouth daily.  3   ? quinapril (Accupril) 40 MG tablet        No current facility-administered medications on file prior to visit.     ALLERGIES  Lidocaine, Statins, Varicella-zoster immune glob, Wound dressing adhesive, and Ampicillin    SOCIAL HISTORY  She  reports that she has never smoked. She has never used smokeless tobacco. She reports that she does not drink alcohol and does not use drugs.    FAMILY HISTORY  No family history on file.     OBJECTIVE     PHYSICAL EXAMINATION    Vital Signs  Visit Vitals  BP 141/65 (BP Location: Left arm, Patient Position: Sitting, BP Cuff Size: Adult)   Pulse 82   Resp 16   SpO2 99%   Smoking Status Never     General Examination  Well developed, well nourished, in no acute distress.   Bilateral LE edema with pressure stockings.    Neurological Examination    Mental status:   Alert and oriented to person, place and time. Oriented to president, current and last 2. Normal fluency and comprehension of complex commands.     Speech: No hypophonia but reduced prosody. No dysarthria.    Cranial nerves:   Pupils equal and reactive to light; extraocular movements intact; no breakdown of smooth pursuit; no hypometric or dysmetric saccades.  Shoulder shrug is symmetric and normal amplitude.   Normal blink rate.     Motor:   Muscle bulk is normal in all extremities.   No pronator  drift.     Movement exam:   Muscle tone is normal in both arms, even with contralateral activation.   There is no tremor at rest.  There is no tremor with posture or action.   Bilaterally she has slowness and smaller finger tapping with some decrement.     Sensory:   There is no Romberg sign.    Coordination:   Finger to nose testing is accurate without dysmetria.     Gait:   She pushes off to stand. Posture is stooped. She shuffles and moves slowly with walker. She is stable however without loss of balance.     LABS and STUDIES    MRI BRAIN:   03/06/2020 MRI Brain [[facility]]  "Impression:  No acute intracranial process. Small remote infarctions left cerebellum, left occipital lobe. Minimal chronic microvascular change and mild cerebral atrophy..."    I personally reviewed brain MRI images on 03/27/2021 and saw infarcts mentioned above which were small and overall felt that brain volume was in normal range for age.   Electronically signed by Trina Ao, MD at 10/29/2022  3:24 PM EDT

## 2022-10-17 NOTE — Progress Notes (Signed)
Formatting of this note is different from the original.  IMPRESSION  Jamie Bryant is a 81 y.o. year old female who returns for follow up. Per neuropsychological testing 10/23/2020, she has prominent frontal-subcortical dysfunction as well as mild parietal and temporal lobe dysfunction. There remain mild features of parkinsonism, especially bradykinesia, but no rigidity or resting tremor. Her gait is shuffling and slow and posture is stooped. 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     She previously failed to tolerate cholinesterase inhibitors. She continues to not recognize that the situation with her neighbor is a delusion. She is not interested in trying medications but is interested in moving. I will have our social worker contact her again about possibility of moving which may be the best intervention that she will agree to.     Since last visit she has had a new brain MRI. She is concerned that it showed strokes. Her 2021 MRI had evidence of old strokes. I will request the early 2023 MRI to review and let her know how it compares to previous.      Diagnosis Plan   1. Dementia associated with other underlying disease with behavioral disturbance (CMS/HCC)       2. Parkinsonism, unspecified Parkinsonism type         RECOMMENDATIONS  Patient Instructions   1. Refer to social worker again about her getting a new apartment.   2. I will review early 2023 MRI at [facility].   3. Follow up 6 months or later in dementia clinic.     TIME/COMMUNICATION  I personally spent a total of 20 minutes today on the patient's encounter. This time was spent reviewing records, discussing the diagnosis and treatment options, counseling/coordination of patient's care as per above, and time spent on documentation.     SUBJECTIVE     CHIEF COMPLAINT & HISTORY OF PRESENT ILLNESS  Jamie Bryant is a 81 y.o. female seen today in the [facility] for follow-up evaluation.    Last  visit: 10/2021    [name] is accompanied by: no one.     Movement Disorder Clinical History    Labor day weekend 2020 she acutely developed left-sided weakness, confusion, slurred speech, rolling tongue, headache, and imbalance after nausea/vomiting the day before. She was hospitalized for 6 days. Work-up included CT head and brain MRI. She was told symptoms were related to her heart. She was discharged on blood thinner and blood pressure medication. Since then she has continued to have issues with left hand function. No tremor. Her voice has gotten softer and she has been asked to repeat.      She uses a walker since September 2020. She had falls directly after hospitalization but not recently. Sometimes her left leg gives out. Exam at 04/2020 visit revealed parkinsonian findings but no bradykinesia.      Neuropsychological testing was performed in 10/08/2020. "The pattern of dysfunction observed today reflects global brain dysfunction, with more prominent frontal-subcortical dysfunction as well as mild parietal and temporal lobe dysfunction. These findings were observed in the context of mild to moderate symptoms of depression, presence of delusions, and borderline symptoms of excessive daytime sleepiness and subthreshold insomnia." She was deemed not safe to live on her own at that time. Significant concerns were raised about her driving.     Previous Relevant Medications:  Galantamine resulted in diarrhea and abdominal discomfort. Memantine had same side effects. Same side effects with Rivastigmine.  Current Relevant Medications:   None. She is not interested in sleep medications.     Updates:     Current Residence/Support: She has daughter in [place]. She is in independent living, same apartment since 2018. Her main concern is neighbor living upstairs. She feels the neighbor sprays things into her apartment. She keeps scratching because there is itching. Every day there is a different disturbance. She  doesn't have enough money to move and hasn't qualified for rent assistance.    Finances: She pays her own bills.   Driving: She is not driving currently. She has friend drive her.    Medication: She keeps track of own medications.   Scheduling: She keeps track of appointments.  Mood: "she does the best she can". The situation with the neighbor is distressing to her.   Sleep: She attributes sleep disruption to neighbor keeping her awake. She thinks when asleep neighbor does more activities.     She had MRI at [facility] in early 2023 and it showed multiple strokes. She is concerned about this.     REVIEW OF SYSTEMS  In addition to the review of systems listed in the HPI, review of systems was also positive for:    She has leg edema and has gone to hospital for this.     PAST MEDICAL HISTORY    Diagnosis Date   ? Asthma    ? Dementia (CMS/HCC)    ? Diabetes mellitus (CMS/HCC)    ? Hypertension      PAST SURGICAL HISTORY  Past Surgical History:   Procedure Laterality Date   ? HYSTERECTOMY     ? OTHER SURGICAL HISTORY      Hemorrhoidectomy-Partial     MEDICATIONS  Current Outpatient Medications on File Prior to Visit   Medication Sig Dispense Refill   ? acetaminophen (Tylenol) 500 MG tablet Take by mouth every 6 hours as needed for mild pain.     ? amLODIPine (Norvasc) 5 MG tablet Take 1 tablet by mouth daily.     ? aspirin 81 MG EC tablet daily.     ? BD Pen Needle Nano 2nd Gen 32G X 4 MM misc USE AS DIRECTED THREE TIMES DAILY  3   ? beclomethasone HFA (Qvar RediHaler) 40 mcg/puff inhaler twice a day.     ? carvedilol (Coreg) 12.5 MG tablet Take 12.5 mg by mouth 2 times daily.     ? cholecalciferol (Vitamin D-3) 125 MCG (5000 UT) capsule Take 1 tablet by mouth daily.     ? coenzyme Q-10 100 MG capsule daily.     ? Daily Multiple Vitamins tablet daily.     ? Evolocumab (Repatha SureClick) 778 MG/ML solution auto-injector INJECT AS DIRECTED EVERY 2 WEEKS     ? insulin lispro (HumaLOG- 1 unit dial) 100 UNIT/ML injection  pen as directed up to 50 units per day     ? linaCLOtide (Linzess) 290 MCG capsule as needed.     ? metoprolol tartrate (Lopressor) 25 MG tablet twice a day.     ? omeprazole (PriLOSEC) 40 MG DR capsule Take 40 mg by mouth daily.     ? pregabalin (Lyrica) 150 MG capsule Take 150 mg by mouth 3 times a day.     ? SITagliptin (Januvia) 100 MG tablet daily.     ? torsemide (Demadex) 20 MG tablet Take 1 tablet by mouth daily.     ? estradiol (Estrace) 1 MG tablet Take 1 mg by mouth daily.  3   ? quinapril (Accupril) 40 MG tablet        No current facility-administered medications on file prior to visit.     ALLERGIES  Lidocaine, Statins, Varicella-zoster immune glob, Wound dressing adhesive, and Ampicillin    SOCIAL HISTORY  She  reports that she has never smoked. She has never used smokeless tobacco. She reports that she does not drink alcohol and does not use drugs.    FAMILY HISTORY  No family history on file.     OBJECTIVE     PHYSICAL EXAMINATION    Vital Signs  Visit Vitals  BP 141/65 (BP Location: Left arm, Patient Position: Sitting, BP Cuff Size: Adult)   Pulse 82   Resp 16   SpO2 99%   Smoking Status Never     General Examination  Well developed, well nourished, in no acute distress.   Bilateral LE edema with pressure stockings.    Neurological Examination    Mental status:   Alert and oriented to person, place and time. Oriented to president, current and last 2. Normal fluency and comprehension of complex commands.     Speech: No hypophonia but reduced prosody. No dysarthria.    Cranial nerves:   Pupils equal and reactive to light; extraocular movements intact; no breakdown of smooth pursuit; no hypometric or dysmetric saccades.  Shoulder shrug is symmetric and normal amplitude.   Normal blink rate.     Motor:   Muscle bulk is normal in all extremities.   No pronator drift.     Movement exam:   Muscle tone is normal in both arms, even with contralateral activation.   There is no tremor at rest.  There is no  tremor with posture or action.   Bilaterally she has slowness and smaller finger tapping with some decrement.     Sensory:   There is no Romberg sign.    Coordination:   Finger to nose testing is accurate without dysmetria.     Gait:   She pushes off to stand. Posture is stooped. She shuffles and moves slowly with walker. She is stable however without loss of balance.     LABS and STUDIES    MRI BRAIN:   03/06/2020 MRI Brain [[facility]]  "Impression:  No acute intracranial process. Small remote infarctions left cerebellum, left occipital lobe. Minimal chronic microvascular change and mild cerebral atrophy..."    I personally reviewed brain MRI images on 03/27/2021 and saw infarcts mentioned above which were small and overall felt that brain volume was in normal range for age.   Electronically signed by Lenis Noon, MD at 10/20/2022  6:03 AM EDT

## 2022-10-20 NOTE — Telephone Encounter (Signed)
Formatting of this note might be different from the original.  ----- Message from Trina Ao, MD sent at 10/17/2022  3:06 PM EDT -----  Follow up 6 months or later in dementia clinic.   Electronically signed by Irish Lack, RN at 10/20/2022  4:06 PM EDT

## 2022-10-20 NOTE — Telephone Encounter (Signed)
Formatting of this note might be different from the original.  Called patient, verified x2 patient identifiers. Offered and scheduled for April 10th at 11:30am. Patient wrote this information down. She had no questions at end of call. Thanked for time.   Electronically signed by Irish Lack, RN at 10/20/2022  4:07 PM EDT

## 2022-10-20 NOTE — Progress Notes (Signed)
Formatting of this note might be different from the original.  SW Case Note-Phone Call:     [place] from MD:     Please check back in with Ms. Gierke who is interested in moving apartments but is having difficulty lining up her finances.     Dione Housekeeper, MSW  Clinical Social Worker II  Pager ID [phone]  Cell# [phone]    Electronically signed by Dione Housekeeper, MSW, Supervisee in Social Work at 10/20/2022 11:06 AM EDT

## 2022-11-03 ENCOUNTER — Ambulatory Visit: Admit: 2022-11-03 | Discharge: 2022-11-03 | Payer: MEDICARE | Attending: Internal Medicine | Primary: Internal Medicine

## 2022-11-03 DIAGNOSIS — L97921 Non-pressure chronic ulcer of unspecified part of left lower leg limited to breakdown of skin: Secondary | ICD-10-CM

## 2022-11-03 DIAGNOSIS — E119 Type 2 diabetes mellitus without complications: Secondary | ICD-10-CM

## 2022-11-03 NOTE — Progress Notes (Unsigned)
Jamie Bryant is a 81 y.o. female  Chief Complaint   Patient presents with    Follow-up    Hypertension     Patient here for follow up high blood pressure.          YES Answers must have Comments  1. "Have you been to the ER, urgent care clinic since your last visit?  Hospitalized since your last visit?"    []  YES   [x]  NO       2. "Have you seen or consulted any other health care providers outside of [facility] since your last visit?"    []  YES   [x]  NO       3.  For patients aged 52-75: "Have you had a colorectal cancer screening such as a colonoscopy/FIT/Cologuard?  Nurse/CMA to request records if not in chart   []  YES   []  NO   [x]  NA, based on age    If the patient is female:      4. For female patients aged 66-74: "Have you had a mammogram in the last two years?" Nurse/CMA to request records if not in chart   []  YES   []  NO   [x]  NA, based on age    24.  For female patients aged 21-65: "Have you had a pap smear?"  Nurse/CMA to request records if not in chart   []  YES   []  NO  [x]  NA, based on age

## 2022-11-04 LAB — BASIC METABOLIC PANEL
Anion Gap: 4 mmol/L — ABNORMAL LOW (ref 5–15)
BUN/Creatinine Ratio: 39 — ABNORMAL HIGH (ref 12–20)
BUN: 51 MG/DL — ABNORMAL HIGH (ref 6–20)
CO2: 34 mmol/L — ABNORMAL HIGH (ref 21–32)
Calcium: 9.9 MG/DL (ref 8.5–10.1)
Chloride: 103 mmol/L (ref 97–108)
Creatinine: 1.31 MG/DL — ABNORMAL HIGH (ref 0.55–1.02)
Est, Glom Filt Rate: 42 mL/min/{1.73_m2} — ABNORMAL LOW (ref >60)
Glucose: 115 mg/dL — ABNORMAL HIGH (ref 65–100)
Potassium: 4.9 mmol/L (ref 3.5–5.1)
Sodium: 141 mmol/L (ref 136–145)

## 2022-11-04 LAB — HEMOGLOBIN A1C
Estimated Avg Glucose: 154 mg/dL
Hemoglobin A1C: 7 % — ABNORMAL HIGH (ref 4.0–5.6)

## 2022-11-04 LAB — MICROALBUMIN / CREATININE URINE RATIO
Creatinine, Ur: 23.8 mg/dL
Microalb, Ur: <0.5 MG/DL
Microalb/Creat Ratio: <21 mg/g (ref 0–30)

## 2022-11-06 LAB — FRUCTOSAMINE: Fructosamine: 270 umol/L (ref 0–285)

## 2022-11-06 NOTE — Telephone Encounter (Signed)
Patient notified and ask to increase her fluid intake. She is scheduld for a 2 week appt for repeat BMP

## 2022-11-06 NOTE — Telephone Encounter (Signed)
-----   Message from Noni Saupe, MD sent at 11/04/2022 10:57 PM EST -----  Tell patient increase her intake of liquids-----return to the office in 2 weeks for a BMP

## 2022-11-09 NOTE — Progress Notes (Signed)
[facility]  [address]  [address]  [place] [address]  Phone:  [phone]  Fax: [phone]       Chief Complaint   Patient presents with    Follow-up    Hypertension     Patient here for follow up high blood pressure.    .      SUBJECTIVE:    Jamie Bryant is a 81 y.o. female  Dictation on: 11/09/2022  6:16 PM by: Vianne Bulls [phone]          Current Outpatient Medications   Medication Sig Dispense Refill    pregabalin (LYRICA) 300 MG capsule Take 1 capsule by mouth 2 times daily for 90 days. Max Daily Amount: 600 mg 60 capsule 2    omeprazole (PRILOSEC) 40 MG delayed release capsule Take 1 capsule by mouth daily 90 capsule 3    aspirin 81 MG chewable tablet Take 1 tablet by mouth daily 30 tablet 11    vitamin D3 (CHOLECALCIFEROL) 125 MCG (5000 UT) TABS tablet Take 1 tablet by mouth daily 30 tablet 11    SITagliptin (JANUVIA) 100 MG tablet Take 1 tablet by mouth daily 90 tablet 3    olmesartan (BENICAR) 20 MG tablet Take 1 tablet daily x 1 week, then 2 tabs daily thereafter 60 tablet 11    quinapril (ACCUPRIL) 40 MG tablet quinapril 40 mg tablet   TAKE 1 TABLET BY MOUTH EVERY NIGHT FOR HIGH BLOOD PRESSURE 90 tablet 3    REPATHA SURECLICK 140 MG/ML SOAJ INJECT AS DIRECTED EVERY 2 WEEKS 2 Adjustable Dose Pre-filled Pen Syringe 11    insulin lispro, 1 Unit Dial, (HUMALOG/ADMELOG) 100 UNIT/ML SOPN Humalog KwikPen (U-100) Insulin 100 unit/mL subcutaneous   INJECT UP TO 50 UNITS UNDER THE SKIN DAILY AS DIRECTED      ipratropium (ATROVENT HFA) 17 MCG/ACT inhaler Atrovent HFA 17 mcg/actuation aerosol inhaler      coenzyme Q10 100 MG CAPS capsule Take 1 capsule by mouth daily      linaclotide (LINZESS) 290 MCG CAPS capsule Take by mouth every morning (before breakfast)      olopatadine (PATANOL) 0.1 % ophthalmic solution Apply 2 drops to eye 2 times daily      estradiol (ESTRACE) 1 MG tablet Take 1 tablet by mouth daily (Patient not taking: Reported on 06/10/2022)       metoprolol tartrate (LOPRESSOR) 50 MG tablet Take 1 tablet by mouth 2 times daily (Patient not taking: Reported on 06/10/2022)       No current facility-administered medications for this visit.     Past Medical History:   Diagnosis Date    Arthritis     Asthma     Chronic renal disease, stage III [facility]) [[id number]] 10/13/2022    Depression     Diabetes (HCC)     Fibromyalgia     Fibromyalgia     Gastroparesis     Hyperlipemia     Hypertension     Neuropathy     Psychotic disorder (HCC)     Stroke [facility])      Past Surgical History:   Procedure Laterality Date    COLONOSCOPY      HYSTERECTOMY (CERVIX STATUS UNKNOWN)       Allergies   Allergen Reactions    Statins Myalgia     Lovastatin, pravastatin, simvastatin, atorvastatin, zetia, livalo, lovaza    Ampicillin Rash    Lidocaine Rash  REVIEW OF SYSTEMS:  General: negative for - chills or fever  ENT: negative for - headaches, nasal congestion or tinnitus  Respiratory: negative for - cough, hemoptysis, shortness of breath or wheezing  Cardiovascular : negative for - chest pain, edema, palpitations or shortness of breath  Gastrointestinal: negative for - abdominal pain, blood in stools, heartburn or nausea/vomiting  Genito-Urinary: no dysuria, trouble voiding, or hematuria  Musculoskeletal: negative for - gait disturbance, joint pain, joint stiffness or joint swelling  Neurological: no TIA or stroke symptoms  Hematologic: no bruises, no bleeding, no swollen glands  Integument: no lumps, mole changes, nail changes or rash  Endocrine: no malaise/lethargy or unexpected weight changes      Social History     Socioeconomic History    Marital status: Divorced     Spouse name: None    Number of children: 2    Years of education: None    Highest education level: Bachelor's degree (e.g., BA, AB, BS)   Tobacco Use    Smoking status: Never     Passive exposure: Never    Smokeless tobacco: Never   Vaping Use    Vaping Use: Never used   Substance and Sexual Activity    Alcohol use:  No    Drug use: No    Sexual activity: Not Currently     Comment: divorced,2 children,retired,living in [facility]   Social History Narrative    Nothing in the past 5 years     Social Determinants of Health     Financial Resource Strain: Low Risk  (11/03/2022)    Overall Financial Resource Strain (CARDIA)     Difficulty of Paying Living Expenses: Not hard at all   Food Insecurity: No Food Insecurity (11/03/2022)    Hunger Vital Sign     Worried About Running Out of Food in the Last Year: Never true     Ran Out of Food in the Last Year: Never true   Transportation Needs: No Transportation Needs (11/03/2022)    PRAPARE - Therapist, art (Medical): No     Lack of Transportation (Non-Medical): No   Physical Activity: Inactive (08/06/2022)    Exercise Vital Sign     Days of Exercise per Week: 0 days     Minutes of Exercise per Session: 0 min   Stress: Stress Concern Present (08/06/2022)    [employer] of Occupational Health - Occupational Stress Questionnaire     Feeling of Stress : Very much   Social Connections: Moderately Isolated (08/06/2022)    Social Connection and Isolation Panel [NHANES]     Frequency of Communication with Friends and Family: More than three times a week     Frequency of Social Gatherings with Friends and Family: Never     Attends Religious Services: Never     Database administrator or Organizations: Yes     Attends Banker Meetings: Never     Marital Status: Divorced   Catering manager Violence: Not At Risk (11/03/2022)    Humiliation, Afraid, Rape, and Kick questionnaire     Fear of Current or Ex-Partner: No     Emotionally Abused: No     Physically Abused: No     Sexually Abused: No   Housing Stability: Low Risk  (08/06/2022)    Housing Stability Vital Sign     Unable to Pay for Housing in the Last Year: No     Number of Places  Lived in the Last Year: 1     Unstable Housing in the Last Year: No     Family History   Problem Relation Age of Onset     Breast Cancer Maternal Grandmother     Cancer Father         lung ca    Hypertension Father     Diabetes Mother     Hypertension Mother        OBJECTIVE:    BP 121/69 (Site: Right Upper Arm, Position: Sitting, Cuff Size: Large Adult)   Pulse 76   Temp 98.9 F (37.2 C) (Oral)   Resp 18   Ht 1.626 m (5\' 4" )   Wt 100 kg (220 lb 6.4 oz)   SpO2 96%   BMI 37.83 kg/m   CONSTITUTIONAL: well , well nourished, appears age appropriate  EYES: perrla, eom intact  ENMT:moist mucous membranes, pharynx clear  NECK: supple. Thyroid normal  RESPIRATORY: Chest: clear to ascultation and percussion   CARDIOVASCULAR: Heart: regular rate and rhythm  GASTROINTESTINAL: Abdomen: soft, bowel sounds active  HEMATOLOGIC: no pathological lymph nodes palpated  MUSCULOSKELETAL: Extremities: trace edema, pulse 1+   INTEGUMENT: No unusual rashes or suspicious skin lesions noted. Nails appear normal.  Superficial ulcer distal left leg  NEUROLOGIC: non-focal exam   MENTAL STATUS: alert and oriented, appropriate affect      ASSESSMENT:  1. Ulcer of left lower extremity, limited to breakdown of skin (HCC)    2. Venous insufficiency    3. Type 2 diabetes mellitus without complication, without long-term current use of insulin (HCC)    4. Stage 3a chronic kidney disease (HCC)    5. Severe obesity (BMI >= 40) (HCC)    6. Essential hypertension, benign        PLAN:   Dictation on: 11/09/2022  6:17 PM by: Arbutus Ped A [phone]     Orders Placed This Encounter   Procedures    Hemoglobin A1C     Standing Status:   Future     Number of Occurrences:   1     Standing Expiration Date:   11/04/2023    Microalbumin / Creatinine Urine Ratio     Standing Status:   Future     Number of Occurrences:   1     Standing Expiration Date:   11/04/2023    Basic Metabolic Panel     Standing Status:   Future     Number of Occurrences:   1     Standing Expiration Date:   11/04/2023        Follow-up and Dispositions    Return in about 4 weeks (around 12/01/2022).              Arbutus Ped, MD

## 2022-11-11 ENCOUNTER — Emergency Department: Admit: 2022-11-11 | Payer: MEDICARE | Primary: Internal Medicine

## 2022-11-11 ENCOUNTER — Inpatient Hospital Stay: Admit: 2022-11-11 | Discharge: 2022-11-11 | Disposition: A | Discharge status: Home / Self Care | Payer: MEDICARE

## 2022-11-11 DIAGNOSIS — L089 Local infection of the skin and subcutaneous tissue, unspecified: Secondary | ICD-10-CM

## 2022-11-11 LAB — CBC WITH AUTO DIFFERENTIAL
Basophils %: 1 % (ref 0–1)
Basophils Absolute: 0.1 10*3/uL (ref 0.0–0.1)
Eosinophils %: 6 % (ref 0–7)
Eosinophils Absolute: 0.4 10*3/uL (ref 0.0–0.4)
Hematocrit: 29 % — ABNORMAL LOW (ref 35.0–47.0)
Hemoglobin: 8.9 g/dL — ABNORMAL LOW (ref 11.5–16.0)
Immature Granulocytes %: 0 %
Immature Granulocytes Absolute: 0 10*3/uL
Lymphocytes %: 32 % (ref 12–49)
Lymphocytes Absolute: 2.3 10*3/uL (ref 0.8–3.5)
MCH: 25.5 PG — ABNORMAL LOW (ref 26.0–34.0)
MCHC: 30.7 g/dL (ref 30.0–36.5)
MCV: 83.1 FL (ref 80.0–99.0)
MPV: 12.3 FL (ref 8.9–12.9)
Monocytes %: 3 % — ABNORMAL LOW (ref 5–13)
Monocytes Absolute: 0.2 10*3/uL (ref 0.0–1.0)
Neutrophils %: 58 % (ref 32–75)
Neutrophils Absolute: 4.2 10*3/uL (ref 1.8–8.0)
Nucleated RBCs: 0 PER 100 WBC
Platelets: 273 10*3/uL (ref 150–400)
RBC: 3.49 M/uL — ABNORMAL LOW (ref 3.80–5.20)
RDW: 15.8 % — ABNORMAL HIGH (ref 11.5–14.5)
WBC Comment: REACTIVE
WBC: 7.2 10*3/uL (ref 3.6–11.0)
nRBC: 0 10*3/uL (ref 0.00–0.01)

## 2022-11-11 LAB — EKG 12-LEAD
Atrial Rate: 72 {beats}/min
Diagnosis: NORMAL
P Axis: 19 degrees
P-R Interval: 126 ms
Q-T Interval: 402 ms
QRS Duration: 76 ms
QTc Calculation (Bazett): 440 ms
R Axis: 12 degrees
T Axis: -2 degrees
Ventricular Rate: 72 {beats}/min

## 2022-11-11 LAB — COMPREHENSIVE METABOLIC PANEL
ALT: 20 U/L (ref 12–78)
AST: 22 U/L (ref 15–37)
Albumin/Globulin Ratio: 0.8 — ABNORMAL LOW (ref 1.1–2.2)
Albumin: 3.4 g/dL — ABNORMAL LOW (ref 3.5–5.0)
Alk Phosphatase: 91 U/L (ref 45–117)
Anion Gap: 4 mmol/L — ABNORMAL LOW (ref 5–15)
BUN/Creatinine Ratio: 18 (ref 12–20)
BUN: 19 MG/DL (ref 6–20)
CO2: 33 mmol/L — ABNORMAL HIGH (ref 21–32)
Calcium: 8.9 MG/DL (ref 8.5–10.1)
Chloride: 100 mmol/L (ref 97–108)
Creatinine: 1.07 MG/DL — ABNORMAL HIGH (ref 0.55–1.02)
Est, Glom Filt Rate: 53 mL/min/{1.73_m2} — ABNORMAL LOW (ref >60)
Globulin: 4.4 g/dL — ABNORMAL HIGH (ref 2.0–4.0)
Glucose: 116 mg/dL — ABNORMAL HIGH (ref 65–100)
Potassium: 4.5 mmol/L (ref 3.5–5.1)
Sodium: 137 mmol/L (ref 136–145)
Total Bilirubin: 0.3 MG/DL (ref 0.2–1.0)
Total Protein: 7.8 g/dL (ref 6.4–8.2)

## 2022-11-11 LAB — EXTRA TUBES HOLD

## 2022-11-11 LAB — POCT GLUCOSE: POC Glucose: 115 mg/dL (ref 65–117)

## 2022-11-11 LAB — BRAIN NATRIURETIC PEPTIDE: NT Pro-BNP: 647 PG/ML — ABNORMAL HIGH (ref <450)

## 2022-11-11 MED ORDER — CEFDINIR 300 MG PO CAPS
300 MG | ORAL_CAPSULE | Freq: Two times a day (BID) | ORAL | 0 refills | Status: AC
Start: 2022-11-11 — End: 2022-11-21

## 2022-11-11 MED ORDER — FUROSEMIDE 10 MG/ML IJ SOLN
10 MG/ML | Freq: Once | INTRAMUSCULAR | Status: AC
Start: 2022-11-11 — End: 2022-11-11
  Administered 2022-11-11: 19:00:00 40 mg via INTRAVENOUS

## 2022-11-11 MED ORDER — ALBUTEROL SULFATE (2.5 MG/3ML) 0.083% IN NEBU
Freq: Once | RESPIRATORY_TRACT | Status: AC
Start: 2022-11-11 — End: 2022-11-11
  Administered 2022-11-11: 18:00:00 2.5 mg via RESPIRATORY_TRACT

## 2022-11-11 MED ORDER — LEVALBUTEROL HCL 1.25 MG/3ML IN NEBU
1.25 MG/3ML | RESPIRATORY_TRACT | Status: DC
Start: 2022-11-11 — End: 2022-11-11

## 2022-11-11 MED FILL — ALBUTEROL SULFATE (2.5 MG/3ML) 0.083% IN NEBU: RESPIRATORY_TRACT | Qty: 3

## 2022-11-11 MED FILL — FUROSEMIDE 10 MG/ML IJ SOLN: 10 MG/ML | INTRAMUSCULAR | Qty: 4

## 2022-11-11 NOTE — ED Provider Notes (Signed)
EMERGENCY DEPARTMENT PHYSICIAN NOTE     Patient: Jamie Bryant     Time of Service: 11/11/2022  1:40 PM     Chief complaint:   Chief Complaint   Patient presents with    Edema        HISTORY:  Patient is a 81 y.o. female who presents to the emergency department with complaints of lower extremity edema.       Past Medical History:   Diagnosis Date    Arthritis     Asthma     Chronic renal disease, stage III [facility]) [[id number]] 10/13/2022    Depression     Diabetes (HCC)     Fibromyalgia     Fibromyalgia     Gastroparesis     Hyperlipemia     Hypertension     Neuropathy     Psychotic disorder (HCC)     Stroke [facility])         Past Surgical History:   Procedure Laterality Date    COLONOSCOPY      HYSTERECTOMY (CERVIX STATUS UNKNOWN)          Family History   Problem Relation Age of Onset    Breast Cancer Maternal Grandmother     Cancer Father         lung ca    Hypertension Father     Diabetes Mother     Hypertension Mother         Social History     Socioeconomic History    Marital status: Divorced    Number of children: 2    Highest education level: Bachelor's degree (e.g., BA, AB, BS)   Tobacco Use    Smoking status: Never     Passive exposure: Never    Smokeless tobacco: Never   Vaping Use    Vaping Use: Never used   Substance and Sexual Activity    Alcohol use: No    Drug use: No    Sexual activity: Not Currently     Comment: divorced,2 children,retired,living in [facility]   Social History Narrative    Nothing in the past 5 years     Social Determinants of Health     Financial Resource Strain: Low Risk  (11/03/2022)    Overall Financial Resource Strain (CARDIA)     Difficulty of Paying Living Expenses: Not hard at all   Food Insecurity: No Food Insecurity (11/03/2022)    Hunger Vital Sign     Worried About Running Out of Food in the Last Year: Never true     Ran Out of Food in the Last Year: Never true   Transportation Needs: No Transportation Needs (11/03/2022)    PRAPARE - Product/process development scientist (Medical): No     Lack of Transportation (Non-Medical): No   Physical Activity: Inactive (08/06/2022)    Exercise Vital Sign     Days of Exercise per Week: 0 days     Minutes of Exercise per Session: 0 min   Stress: Stress Concern Present (08/06/2022)    [employer] of Occupational Health - Occupational Stress Questionnaire     Feeling of Stress : Very much   Social Connections: Moderately Isolated (08/06/2022)    Social Connection and Isolation Panel [NHANES]     Frequency of Communication with Friends and Family: More than three times a week     Frequency of Social Gatherings with Friends and Family: Never     Attends  Religious Services: Never     Active Member of Clubs or Organizations: Yes     Attends Archivist Meetings: Never     Marital Status: Divorced   Human resources officer Violence: Not At Risk (11/03/2022)    Humiliation, Afraid, Rape, and Kick questionnaire     Fear of Current or Ex-Partner: No     Emotionally Abused: No     Physically Abused: No     Sexually Abused: No   Housing Stability: Low Risk  (08/06/2022)    Housing Stability Vital Sign     Unable to Pay for Housing in the Last Year: No     Number of [place] in the Last Year: 1     Unstable Housing in the Last Year: No        Current Medications: Reviewed in chart.    Allergies:   Allergies   Allergen Reactions    Statins Myalgia     Lovastatin, pravastatin, simvastatin, atorvastatin, zetia, livalo, lovaza    Ampicillin Rash    Lidocaine Rash          REVIEW OF SYSTEMS: See HPI for pertinent positives and negatives.      PHYSICAL EXAM:  BP (!) 116/54   Pulse 76   Temp 98.1 F (36.7 C) (Oral)   Resp 24   Ht 1.626 m (5\' 4" )   Wt 108.5 kg (239 lb 1.6 oz)   SpO2 98%   BMI 41.04 kg/m    Physical Exam  Vitals and nursing note reviewed.   Constitutional:       General: She is not in acute distress.     Appearance: Normal appearance. She is normal weight. She is not toxic-appearing.   HENT:      Head: Normocephalic and  atraumatic.      Nose: Nose normal.      Mouth/Throat:      Mouth: Mucous membranes are moist.      Pharynx: Oropharynx is clear.   Eyes:      Extraocular Movements: Extraocular movements intact.      Conjunctiva/sclera: Conjunctivae normal.      Pupils: Pupils are equal, round, and reactive to light.   Cardiovascular:      Rate and Rhythm: Normal rate and regular rhythm.      Pulses: Normal pulses.      Heart sounds: Normal heart sounds.   Pulmonary:      Effort: Pulmonary effort is normal.      Breath sounds: Normal breath sounds. No wheezing.   Abdominal:      General: Abdomen is flat. Bowel sounds are normal.      Palpations: Abdomen is soft.      Tenderness: There is no abdominal tenderness. There is no guarding.   Musculoskeletal:         General: Signs of injury (left anterior lower extremity, with surrounding erythema, clear drainage) present. No swelling or tenderness. Normal range of motion.      Cervical back: Normal range of motion and neck supple. No rigidity or tenderness.      Right lower leg: Edema present.      Left lower leg: Edema present.   Skin:     General: Skin is warm and dry.      Capillary Refill: Capillary refill takes less than 2 seconds.   Neurological:      General: No focal deficit present.      Mental Status: She is alert and oriented to person,  place, and time. Mental status is at baseline.      Cranial Nerves: No cranial nerve deficit.   Psychiatric:         Mood and Affect: Mood normal.         Behavior: Behavior normal.         Thought Content: Thought content normal.         Judgment: Judgment normal.           ED Course:    ED Course as of 11/12/22 0746   Tue Nov 11, 2022   1239 EKG at 1239 shows no sign rhythm at a rate of 72 bpm, no acute ST segment elevations noted, normal intervals, no ectopy. [JR]   1342 Chest x-ray did not show acute findings interpreted radiologist [JR]   1501 Left lower extremity venous ultrasound did not show any evidence for DVT.  Did show lymph nodes  in the left groin. [JR]      ED Course User Index  [JR] Dewain Penningiaz, [place] Sherr, DO         ED physician interpretation of EKG: No STEMI.  See my interpretation of EKG in ED course above.    Laboratory Results:  Labs Reviewed   CBC WITH AUTO DIFFERENTIAL - Abnormal; Notable for the following components:       Result Value    RBC 3.49 (*)     Hemoglobin 8.9 (*)     Hematocrit 29.0 (*)     MCH 25.5 (*)     RDW 15.8 (*)     Monocytes % 3 (*)     All other components within normal limits   COMPREHENSIVE METABOLIC PANEL - Abnormal; Notable for the following components:    CO2 33 (*)     Anion Gap 4 (*)     Glucose 116 (*)     Creatinine 1.07 (*)     Est, Glom Filt Rate 53 (*)     Albumin 3.4 (*)     Globulin 4.4 (*)     Albumin/Globulin Ratio 0.8 (*)     All other components within normal limits   BRAIN NATRIURETIC PEPTIDE - Abnormal; Notable for the following components:    NT Pro-BNP 647 (*)     All other components within normal limits   EXTRA TUBES HOLD   POCT GLUCOSE     ED physician interpretation of laboratory results: Documented in ED course    Imaging Results:  Vascular duplex lower extremity venous left         XR CHEST PORTABLE   Final Result   No acute process identified.             ED physician interpretation of imaging: Documented in ED course    Medications Given:  Medications   furosemide (LASIX) injection 40 mg (40 mg IntraVENous Given 11/11/22 1350)   albuterol (PROVENTIL) (2.5 MG/3ML) 0.083% nebulizer solution 2.5 mg (2.5 mg Nebulization Given 11/11/22 1321)       Differential Diagnosis included but not limited to:  Such as:  Fracture, dislocation, foreign body, arterial injury, nerve injury, infection.    Medical Decision Making  The patient was placed into an examination in room.    Nursing notes were reviewed.    The patient was interviewed and an examination was completed by me with the above findings.        MDM:    This is a 81 year old female with a history sniffer diabetes, dementia, and heart failure  who presents to the ED via EMS from nursing facility for chief complaint of lower extremity edema and weight gain over the past 2 weeks.  She has gained about 14 pounds over the last 2 weeks.  Patient does have lower extremity edema that is greater on the left.  We will noted to the left anterior lower leg that is draining clear fluid.  There is surrounding erythema in the left lower extremity.  Patient not had any fevers, or any difficulty breathing.  At this time because of patient's history of congestive heart failure we will obtain lab work.  We will obtain a left lower extremity venous ultrasound.    Lower extremity venous ultrasound did not show any evidence for DVT.  BNP was slightly elevated.  Lab work did show significant for anemia at 8.9, but patient is not a candidate for transfusion at this time based off of hemoglobin level.  Chest x-ray did not show any acute findings interpreted with radiologist.    I believe patient is having lower extremity edema consistent with a history of heart failure however since there is open wound noted to the left anterior shin with surrounding redness, without any purulent drainage I am concerned that this may be a soft tissue infection.  We will start the patient on antibiotics.      It is my clinical impression today patient presents for: Lower extremity edema, soft tissue infection.        I've discussed my findings, impression in detail with the patient at the bedside.  I have provided the opportunity to ask questions, and have addressed all questions at the bedside.    Expectations, return precautions verbalized at bedside.            At this time I do feel patient is stable for discharge. I feel no further laboratory evaluation/imaging is indicated. At this time patient will be discharged in stable and non toxic condition. Patient has been advised to return for new, worsening, concerning symptoms.  Patient had all their questions answered and were agreeable to this  plan            * Document created with voice recognition software which can lead to typographical errors.    Amount and/or Complexity of Data Reviewed  Labs: ordered.  Radiology: ordered.    Risk  Prescription drug management.          Procedures       DISPOSITION: Decision To Discharge 11/11/2022 03:01:56 PM    CLINICAL IMPRESSION:   1. Soft tissue infection    2. Lower extremity edema         Further personalized recommendations for outpatient care as below.    Key discharge instructions and summary of care provided in AVS:     Prescriptions provided to the patient:     Discharge Medication List as of 11/11/2022  3:03 PM        START taking these medications    Details   cefdinir (OMNICEF) 300 MG capsule Take 1 capsule by mouth 2 times daily for 10 days, Disp-20 capsule, R-0Normal              Dewain Penning, DO   Emergency Medicine Attending Physician          Dewain Penning, DO  11/12/22 [phone]

## 2022-11-11 NOTE — Discharge Instructions (Signed)
Routine appointments for health maintenance with a primary care provider are very important and emergency department visits are no substitute.  You should review all findings and test results from your visit today with your primary care physician.        We recommended that you take medications as prescribed.        Return to the emergency department for any new or concerning signs/symptoms or failure to improve.

## 2022-11-11 NOTE — ED Triage Notes (Signed)
Patient arrives to ED via EMS from [facility] for increased edema, wheezing and 14lb weight gain in 2 weeks.

## 2022-11-11 NOTE — Care Coordination-Inpatient (Signed)
ED Care Management - Received call from patient's RN to request ambulatory transportation for patient.     Met with patient at bedside. Patient confirmed her address. Her son works Statistician and is sleeping and did not answer his test. Other family is not able to drive. Daughter lives in [place]. Patient said she normally walks with a walker for long distances but would be able to walk to her apartment if dropped off at her apartment. She has her key.     Spoke with RN. Patient will be ready at 4:30 PM. CM set up Lyft transport via [employer] for 4:30 PM.    [place], MSW

## 2022-11-11 NOTE — ED Notes (Signed)
Pt reports coming from [place] where she lives in the independent section so there is not a nurse to call back report to.     Althia Forts, RN  11/11/22 [phone]

## 2022-11-13 LAB — VAS DUP LOWER EXTREMITY VENOUS LEFT: Body Surface Area: 2.21 m2

## 2022-11-25 ENCOUNTER — Ambulatory Visit: Admit: 2022-11-25 | Discharge: 2022-11-25 | Payer: MEDICARE | Attending: Internal Medicine | Primary: Internal Medicine

## 2022-11-25 DIAGNOSIS — L97925 Non-pressure chronic ulcer of unspecified part of left lower leg with muscle involvement without evidence of necrosis: Secondary | ICD-10-CM

## 2022-11-25 DIAGNOSIS — I10 Essential (primary) hypertension: Secondary | ICD-10-CM

## 2022-11-25 NOTE — Progress Notes (Signed)
Chief Complaint   Patient presents with    Diabetes    Hypertension     "Have you been to the ER, urgent care clinic since your last visit?  Hospitalized since your last visit?"    YES - When: approximately 2  weeks ago.  Where and Why: [place] doctor's leg pain.    "Have you seen or consulted any other health care providers outside of [place] since your last visit?"    NO

## 2022-11-26 LAB — BASIC METABOLIC PANEL
Anion Gap: 3 mmol/L — ABNORMAL LOW (ref 5–15)
BUN: 20 MG/DL (ref 6–20)
Bun/Cre Ratio: 16 (ref 12–20)
CO2: 28 mmol/L (ref 21–32)
Calcium: 9.5 MG/DL (ref 8.5–10.1)
Chloride: 108 mmol/L (ref 97–108)
Creatinine: 1.29 MG/DL — ABNORMAL HIGH (ref 0.55–1.02)
Est, Glom Filt Rate: 42 mL/min/{1.73_m2} — ABNORMAL LOW (ref >60)
Glucose: 91 mg/dL (ref 65–100)
Potassium: 4.5 mmol/L (ref 3.5–5.1)
Sodium: 139 mmol/L (ref 136–145)

## 2022-11-27 NOTE — Telephone Encounter (Signed)
-----   Message from Noni Saupe, MD sent at 11/16/2022  3:31 PM EST -----  Ask patient if she is taking a diuretic------ if so let me know the name and dose--- is not in the chart  Additionally she is taking olmesartan and quinapril----she should not be on both of these-----for now we will discontinue olmesartan  Return to office in the next 2 weeks for BMP.

## 2022-11-27 NOTE — Telephone Encounter (Signed)
Patient states she is taking torsemide 20mg  qd and she does not take olmesartan or lisinopril, she takes amlodipine 10mg  and carvedilol 12.5mg  1 bid.she has appt on tuesday

## 2022-11-27 NOTE — Telephone Encounter (Signed)
Leg wound

## 2022-11-28 MED ORDER — CLINDAMYCIN HCL 300 MG PO CAPS
300 MG | ORAL_CAPSULE | Freq: Three times a day (TID) | ORAL | 0 refills | Status: AC
Start: 2022-11-28 — End: 2022-12-08

## 2022-11-30 DIAGNOSIS — L97925 Non-pressure chronic ulcer of unspecified part of left lower leg with muscle involvement without evidence of necrosis: Secondary | ICD-10-CM

## 2022-11-30 NOTE — Progress Notes (Signed)
[facility]  [address]  [address] [place] [address]  Phone:  [phone]  Fax: [phone]       Chief Complaint   Patient presents with    Diabetes    Hypertension   .      SUBJECTIVE:    Jamie Bryant is a 81 y.o. female  Dictation on: 11/30/2022 12:09 PM by: Vianne Bulls [phone]          Current Outpatient Medications   Medication Sig Dispense Refill    omeprazole (PRILOSEC) 40 MG delayed release capsule Take 1 capsule by mouth daily 90 capsule 3    aspirin 81 MG chewable tablet Take 1 tablet by mouth daily 30 tablet 11    vitamin D3 (CHOLECALCIFEROL) 125 MCG (5000 UT) TABS tablet Take 1 tablet by mouth daily 30 tablet 11    SITagliptin (JANUVIA) 100 MG tablet Take 1 tablet by mouth daily 90 tablet 3    quinapril (ACCUPRIL) 40 MG tablet quinapril 40 mg tablet   TAKE 1 TABLET BY MOUTH EVERY NIGHT FOR HIGH BLOOD PRESSURE 90 tablet 3    REPATHA SURECLICK 140 MG/ML SOAJ INJECT AS DIRECTED EVERY 2 WEEKS 2 Adjustable Dose Pre-filled Pen Syringe 11    insulin lispro, 1 Unit Dial, (HUMALOG/ADMELOG) 100 UNIT/ML SOPN Humalog KwikPen (U-100) Insulin 100 unit/mL subcutaneous   INJECT UP TO 50 UNITS UNDER THE SKIN DAILY AS DIRECTED      ipratropium (ATROVENT HFA) 17 MCG/ACT inhaler Atrovent HFA 17 mcg/actuation aerosol inhaler      coenzyme Q10 100 MG CAPS capsule Take 1 capsule by mouth daily      linaclotide (LINZESS) 290 MCG CAPS capsule Take by mouth every morning (before breakfast)      olopatadine (PATANOL) 0.1 % ophthalmic solution Apply 2 drops to eye 2 times daily      clindamycin (CLEOCIN) 300 MG capsule Take 1 capsule by mouth 3 times daily for 10 days 30 capsule 0    pregabalin (LYRICA) 300 MG capsule Take 1 capsule by mouth 2 times daily for 90 days. Max Daily Amount: 600 mg 60 capsule 2    olmesartan (BENICAR) 20 MG tablet Take 1 tablet daily x 1 week, then 2 tabs daily thereafter (Patient not taking: Reported on 11/25/2022) 60 tablet 11    estradiol (ESTRACE) 1 MG  tablet Take 1 tablet by mouth daily (Patient not taking: Reported on 06/10/2022)      metoprolol tartrate (LOPRESSOR) 50 MG tablet Take 1 tablet by mouth 2 times daily (Patient not taking: Reported on 06/10/2022)       No current facility-administered medications for this visit.     Past Medical History:   Diagnosis Date    Arthritis     Asthma     Chronic renal disease, stage III [facility]) [[id number]] 10/13/2022    Depression     Diabetes (HCC)     Fibromyalgia     Fibromyalgia     Gastroparesis     Hyperlipemia     Hypertension     Neuropathy     Psychotic disorder (HCC)     Stroke [facility])      Past Surgical History:   Procedure Laterality Date    COLONOSCOPY      HYSTERECTOMY (CERVIX STATUS UNKNOWN)       Allergies   Allergen Reactions    Statins Myalgia     Lovastatin, pravastatin, simvastatin, atorvastatin, zetia, livalo, lovaza  Ampicillin Rash    Lidocaine Rash         REVIEW OF SYSTEMS:  General: negative for - chills or fever  ENT: negative for - headaches, nasal congestion or tinnitus  Respiratory: negative for - cough, hemoptysis, shortness of breath or wheezing  Cardiovascular : negative for - chest pain, edema, palpitations or shortness of breath  Gastrointestinal: negative for - abdominal pain, blood in stools, heartburn or nausea/vomiting  Genito-Urinary: no dysuria, trouble voiding, or hematuria  Musculoskeletal: negative for - gait disturbance, joint pain, joint stiffness or joint swelling  Neurological: no TIA or stroke symptoms  Hematologic: no bruises, no bleeding, no swollen glands  Integument: no lumps, mole changes, nail changes or rash  Endocrine: no malaise/lethargy or unexpected weight changes      Social History     Socioeconomic History    Marital status: Divorced     Spouse name: None    Number of children: 2    Years of education: None    Highest education level: Bachelor's degree (e.g., BA, AB, BS)   Tobacco Use    Smoking status: Never     Passive exposure: Never    Smokeless tobacco: Never    Vaping Use    Vaping Use: Never used   Substance and Sexual Activity    Alcohol use: No    Drug use: No    Sexual activity: Not Currently     Comment: divorced,2 children,retired,living in [facility]   Social History Narrative    Nothing in the past 5 years     Social Determinants of Health     Financial Resource Strain: Low Risk  (11/03/2022)    Overall Financial Resource Strain (CARDIA)     Difficulty of Paying Living Expenses: Not hard at all   Transportation Needs: No Transportation Needs (11/03/2022)    PRAPARE - Therapist, art (Medical): No     Lack of Transportation (Non-Medical): No   Physical Activity: Inactive (08/06/2022)    Exercise Vital Sign     Days of Exercise per Week: 0 days     Minutes of Exercise per Session: 0 min   Stress: Stress Concern Present (08/06/2022)    [employer] of Occupational Health - Occupational Stress Questionnaire     Feeling of Stress : Very much   Social Connections: Moderately Isolated (08/06/2022)    Social Connection and Isolation Panel [NHANES]     Frequency of Communication with Friends and Family: More than three times a week     Frequency of Social Gatherings with Friends and Family: Never     Attends Religious Services: Never     Database administrator or Organizations: Yes     Attends Banker Meetings: Never     Marital Status: Divorced   Catering manager Violence: Not At Risk (11/03/2022)    Humiliation, Afraid, Rape, and Kick questionnaire     Fear of Current or Ex-Partner: No     Emotionally Abused: No     Physically Abused: No     Sexually Abused: No   Housing Stability: Low Risk  (08/06/2022)    Housing Stability Vital Sign     Unable to Pay for Housing in the Last Year: No     Number of Places Lived in the Last Year: 1     Unstable Housing in the Last Year: No     Family History   Problem Relation Age of  Onset    Breast Cancer Maternal Grandmother     Cancer Father         lung ca    Hypertension Father      Diabetes Mother     Hypertension Mother        OBJECTIVE:    BP (!) 145/76 (Site: Right Upper Arm, Position: Sitting)   Pulse 75   Temp 98 F (36.7 C) (Oral)   Resp 20   Ht 1.626 m (5\' 4" )   Wt 99.5 kg (219 lb 4.8 oz)   SpO2 95%   BMI 37.64 kg/m   CONSTITUTIONAL: well , well nourished, appears age appropriate  EYES: perrla, eom intact  ENMT:moist mucous membranes, pharynx clear  NECK: supple. Thyroid normal  RESPIRATORY: Chest: clear to ascultation and percussion   CARDIOVASCULAR: Heart: regular rate and rhythm  GASTROINTESTINAL: Abdomen: soft, bowel sounds active  HEMATOLOGIC: no pathological lymph nodes palpated  MUSCULOSKELETAL: Extremities: no edema, pulse 1+   INTEGUMENT: No unusual rashes or suspicious skin lesions noted. Nails appear normal.,  Left leg ulcer is wrapped-----not removed  NEUROLOGIC: non-focal exam   MENTAL STATUS: alert and oriented, appropriate affect      ASSESSMENT:  1. Ulcer of left lower extremity with muscle involvement without evidence of necrosis (HCC)    2. Type 2 diabetes mellitus without complication, without long-term current use of insulin (HCC)    3. Essential (primary) hypertension    4. Venous insufficiency    5. Dyslipidemia    6. Delusion (HCC)    7. Severe obesity (BMI 35.0-39.9) with comorbidity [facility])        PLAN:   Dictation on: 11/30/2022 12:11 PM by: Vianne Bulls [phone]     Orders Placed This Encounter   Procedures    Basic Metabolic Panel     Per providers verbal orders     Standing Status:   Future     Number of Occurrences:   1     Standing Expiration Date:   11/26/2023        Follow-up and Dispositions    Return in about 3 weeks (around 12/16/2022).             Arbutus Ped, MD

## 2022-12-03 ENCOUNTER — Ambulatory Visit: Payer: MEDICARE | Attending: Internal Medicine | Primary: Internal Medicine

## 2022-12-15 ENCOUNTER — Inpatient Hospital Stay
Admission: EM | Admit: 2022-12-15 | Discharge: 2022-12-17 | Disposition: A | Discharge status: Home / Self Care | Payer: Medicare Other | Source: Other Acute Inpatient Hospital | Admitting: Internal Medicine

## 2022-12-15 ENCOUNTER — Observation Stay: Admit: 2022-12-16 | Payer: MEDICARE | Primary: Internal Medicine

## 2022-12-15 ENCOUNTER — Emergency Department: Admit: 2022-12-15 | Payer: MEDICARE | Primary: Internal Medicine

## 2022-12-15 DIAGNOSIS — J45901 Unspecified asthma with (acute) exacerbation: Secondary | ICD-10-CM

## 2022-12-15 DIAGNOSIS — J441 Chronic obstructive pulmonary disease with (acute) exacerbation: Secondary | ICD-10-CM

## 2022-12-15 LAB — POCT GLUCOSE
POC Glucose: 190 mg/dL — ABNORMAL HIGH (ref 65–117)
POC Glucose: 189 mg/dL — ABNORMAL HIGH (ref 65–117)
POC Glucose: 231 mg/dL — ABNORMAL HIGH (ref 65–117)
POC Glucose: 248 mg/dL — ABNORMAL HIGH (ref 65–117)

## 2022-12-15 LAB — EXTRA TUBES HOLD

## 2022-12-15 LAB — CBC WITH AUTO DIFFERENTIAL
Basophils %: 1 % (ref 0–1)
Basophils Absolute: 0.1 10*3/uL (ref 0.0–0.1)
Eosinophils %: 2 % (ref 0–7)
Eosinophils Absolute: 0.2 10*3/uL (ref 0.0–0.4)
Hematocrit: 30.2 % — ABNORMAL LOW (ref 35.0–47.0)
Hemoglobin: 9.2 g/dL — ABNORMAL LOW (ref 11.5–16.0)
Immature Granulocytes %: 0 % (ref 0.0–0.5)
Immature Granulocytes Absolute: 0 10*3/uL (ref 0.00–0.04)
Lymphocytes %: 14 % (ref 12–49)
Lymphocytes Absolute: 1.6 10*3/uL (ref 0.8–3.5)
MCH: 25.7 PG — ABNORMAL LOW (ref 26.0–34.0)
MCHC: 30.5 g/dL (ref 30.0–36.5)
MCV: 84.4 FL (ref 80.0–99.0)
MPV: 12.4 FL (ref 8.9–12.9)
Monocytes %: 6 % (ref 5–13)
Monocytes Absolute: 0.7 10*3/uL (ref 0.0–1.0)
Neutrophils %: 77 % — ABNORMAL HIGH (ref 32–75)
Neutrophils Absolute: 8.2 10*3/uL — ABNORMAL HIGH (ref 1.8–8.0)
Nucleated RBCs: 0 PER 100 WBC
Platelets: 300 10*3/uL (ref 150–400)
RBC: 3.58 M/uL — ABNORMAL LOW (ref 3.80–5.20)
RDW: 16.8 % — ABNORMAL HIGH (ref 11.5–14.5)
WBC: 10.8 10*3/uL (ref 3.6–11.0)
nRBC: 0 10*3/uL (ref 0.00–0.01)

## 2022-12-15 LAB — COMPREHENSIVE METABOLIC PANEL
ALT: 19 U/L (ref 12–78)
AST: 15 U/L (ref 15–37)
Albumin/Globulin Ratio: 0.9 — ABNORMAL LOW (ref 1.1–2.2)
Albumin: 3.5 g/dL (ref 3.5–5.0)
Alk Phosphatase: 96 U/L (ref 45–117)
Anion Gap: 1 mmol/L — ABNORMAL LOW (ref 5–15)
BUN/Creatinine Ratio: 26 — ABNORMAL HIGH (ref 12–20)
BUN: 22 MG/DL — ABNORMAL HIGH (ref 6–20)
CO2: 31 mmol/L (ref 21–32)
Calcium: 8.9 MG/DL (ref 8.5–10.1)
Chloride: 110 mmol/L — ABNORMAL HIGH (ref 97–108)
Creatinine: 0.86 MG/DL (ref 0.55–1.02)
Est, Glom Filt Rate: >60 mL/min/{1.73_m2} (ref >60)
Globulin: 3.9 g/dL (ref 2.0–4.0)
Glucose: 49 mg/dL — CL (ref 65–100)
Potassium: 3.9 mmol/L (ref 3.5–5.1)
Sodium: 142 mmol/L (ref 136–145)
Total Bilirubin: 0.2 MG/DL (ref 0.2–1.0)
Total Protein: 7.4 g/dL (ref 6.4–8.2)

## 2022-12-15 LAB — BRAIN NATRIURETIC PEPTIDE: NT Pro-BNP: 949 PG/ML — ABNORMAL HIGH (ref <450)

## 2022-12-15 LAB — TROPONIN: Troponin, High Sensitivity: 12 ng/L (ref 0–51)

## 2022-12-15 LAB — RAPID INFLUENZA A/B ANTIGENS
Influenza A Ag: NEGATIVE
Influenza B Ag: NEGATIVE

## 2022-12-15 LAB — COVID-19, RAPID: SARS-CoV-2, Rapid: NOT DETECTED

## 2022-12-15 MED ORDER — NORMAL SALINE FLUSH 0.9 % IV SOLN
0.9 % | Freq: Two times a day (BID) | INTRAVENOUS | Status: DC
Start: 2022-12-15 — End: 2022-12-17
  Administered 2022-12-16: 14:00:00 10 mL via INTRAVENOUS
  Administered 2022-12-16: 01:00:00 30 mL via INTRAVENOUS
  Administered 2022-12-17 (×2): 10 mL via INTRAVENOUS

## 2022-12-15 MED ORDER — COENZYME Q10 100 MG PO CAPS
100 MG | Freq: Every day | ORAL | Status: DC
Start: 2022-12-15 — End: 2022-12-15

## 2022-12-15 MED ORDER — ALBUTEROL SULFATE (2.5 MG/3ML) 0.083% IN NEBU
Freq: Four times a day (QID) | RESPIRATORY_TRACT | Status: DC
Start: 2022-12-15 — End: 2022-12-16
  Administered 2022-12-15 – 2022-12-16 (×3): 2.5 mg via RESPIRATORY_TRACT

## 2022-12-15 MED ORDER — ACETAMINOPHEN 650 MG RE SUPP
650 | Freq: Four times a day (QID) | RECTAL | Status: DC | PRN
Start: 2022-12-15 — End: 2022-12-17

## 2022-12-15 MED ORDER — IPRATROPIUM-ALBUTEROL 0.5-2.5 (3) MG/3ML IN SOLN
RESPIRATORY_TRACT | Status: AC
Start: 2022-12-15 — End: 2022-12-15
  Administered 2022-12-15: 18:00:00 1 via RESPIRATORY_TRACT

## 2022-12-15 MED ORDER — INSULIN LISPRO 100 UNIT/ML IJ SOLN
100 UNIT/ML | Freq: Every evening | INTRAMUSCULAR | Status: AC
Start: 2022-12-15 — End: 2022-12-17

## 2022-12-15 MED ORDER — ONDANSETRON HCL 4 MG/2ML IJ SOLN
4 MG/2ML | Freq: Four times a day (QID) | INTRAMUSCULAR | Status: DC | PRN
Start: 2022-12-15 — End: 2022-12-17

## 2022-12-15 MED ORDER — PANTOPRAZOLE SODIUM 40 MG PO TBEC
40 MG | Freq: Every day | ORAL | Status: AC
Start: 2022-12-15 — End: 2022-12-17
  Administered 2022-12-15 – 2022-12-17 (×3): 40 mg via ORAL

## 2022-12-15 MED ORDER — ACETAMINOPHEN 325 MG PO TABS
325 MG | Freq: Four times a day (QID) | ORAL | Status: DC | PRN
Start: 2022-12-15 — End: 2022-12-17
  Administered 2022-12-16 – 2022-12-17 (×2): 650 mg via ORAL

## 2022-12-15 MED ORDER — BUDESONIDE 0.25 MG/2ML IN SUSP
0.25 MG/2ML | Freq: Two times a day (BID) | RESPIRATORY_TRACT | Status: DC
Start: 2022-12-15 — End: 2022-12-17
  Administered 2022-12-15 – 2022-12-17 (×5): via RESPIRATORY_TRACT

## 2022-12-15 MED ORDER — INSULIN LISPRO 100 UNIT/ML IJ SOLN
100 UNIT/ML | Freq: Three times a day (TID) | INTRAMUSCULAR | Status: AC
Start: 2022-12-15 — End: 2022-12-17
  Administered 2022-12-15 – 2022-12-16 (×3): 1 [IU] via SUBCUTANEOUS

## 2022-12-15 MED ORDER — PREDNISONE 20 MG PO TABS
20 MG | Freq: Every day | ORAL | Status: DC
Start: 2022-12-15 — End: 2022-12-16
  Administered 2022-12-15: 14:00:00 40 mg via ORAL

## 2022-12-15 MED ORDER — ALBUTEROL SULFATE (2.5 MG/3ML) 0.083% IN NEBU
RESPIRATORY_TRACT | Status: DC | PRN
Start: 2022-12-15 — End: 2022-12-15
  Administered 2022-12-15: 15:00:00 2.5 mg via RESPIRATORY_TRACT

## 2022-12-15 MED ORDER — ALOGLIPTIN BENZOATE 25 MG PO TABS
25 MG | Freq: Every day | ORAL | Status: DC
Start: 2022-12-15 — End: 2022-12-17
  Administered 2022-12-15 – 2022-12-17 (×3): 25 mg via ORAL

## 2022-12-15 MED ORDER — ONDANSETRON 4 MG PO TBDP
4 MG | Freq: Three times a day (TID) | ORAL | Status: DC | PRN
Start: 2022-12-15 — End: 2022-12-17

## 2022-12-15 MED ORDER — METHYLPREDNISOLONE NA SUC (PF) 125 MG IJ SOLR
125 MG | INTRAMUSCULAR | Status: AC
Start: 2022-12-15 — End: 2022-12-15
  Administered 2022-12-15: 11:00:00 125 mg via INTRAVENOUS

## 2022-12-15 MED ORDER — POTASSIUM BICARB-CITRIC ACID 20 MEQ PO TBEF
20 MEQ | Freq: Once | ORAL | Status: AC
Start: 2022-12-15 — End: 2022-12-15
  Administered 2022-12-15: 13:00:00 20 meq via ORAL

## 2022-12-15 MED ORDER — VITAMIN D 25 MCG (1000 UT) PO TABS
251000 MCG (1000 UT) | Freq: Every day | ORAL | Status: AC
Start: 2022-12-15 — End: 2022-12-17
  Administered 2022-12-15 – 2022-12-17 (×3): 5000 [IU] via ORAL

## 2022-12-15 MED ORDER — PREGABALIN 100 MG PO CAPS
100 MG | Freq: Two times a day (BID) | ORAL | Status: DC
Start: 2022-12-15 — End: 2022-12-17
  Administered 2022-12-15 – 2022-12-17 (×5): 300 mg via ORAL

## 2022-12-15 MED ORDER — LINACLOTIDE 290 MCG PO CAPS
290 MCG | Freq: Every day | ORAL | Status: AC
Start: 2022-12-15 — End: 2022-12-17

## 2022-12-15 MED ORDER — KETOTIFEN FUMARATE 0.035 % OP SOLN
0.035 % | Freq: Two times a day (BID) | OPHTHALMIC | Status: DC
Start: 2022-12-15 — End: 2022-12-17
  Administered 2022-12-15 – 2022-12-17 (×3): 1 [drp] via OPHTHALMIC

## 2022-12-15 MED ORDER — DEXTROSE 50 % IV SOLN
50 % | Freq: Once | INTRAVENOUS | Status: AC
Start: 2022-12-15 — End: 2022-12-15
  Administered 2022-12-15: 11:00:00 25 g via INTRAVENOUS

## 2022-12-15 MED ORDER — IPRATROPIUM-ALBUTEROL 0.5-2.5 (3) MG/3ML IN SOLN
RESPIRATORY_TRACT | Status: AC
Start: 2022-12-15 — End: 2022-12-15
  Administered 2022-12-15 (×2): 1 via RESPIRATORY_TRACT

## 2022-12-15 MED ORDER — NORMAL SALINE FLUSH 0.9 % IV SOLN
0.9 % | INTRAVENOUS | Status: DC | PRN
Start: 2022-12-15 — End: 2022-12-17

## 2022-12-15 MED ORDER — SODIUM CHLORIDE 0.9 % IV SOLN
0.9 % | INTRAVENOUS | Status: DC | PRN
Start: 2022-12-15 — End: 2022-12-17

## 2022-12-15 MED ORDER — ASPIRIN 81 MG PO CHEW
81 MG | Freq: Every day | ORAL | Status: DC
Start: 2022-12-15 — End: 2022-12-17
  Administered 2022-12-15 – 2022-12-17 (×3): 81 mg via ORAL

## 2022-12-15 MED ORDER — LISINOPRIL 20 MG PO TABS
20 MG | Freq: Every day | ORAL | Status: AC
Start: 2022-12-15 — End: 2022-12-17
  Administered 2022-12-15 – 2022-12-17 (×2): 20 mg via ORAL

## 2022-12-15 MED ORDER — QUETIAPINE FUMARATE 25 MG PO TABS
25 MG | Freq: Every evening | ORAL | Status: DC
Start: 2022-12-15 — End: 2022-12-17
  Administered 2022-12-16 – 2022-12-17 (×2): 12.5 mg via ORAL

## 2022-12-15 MED ORDER — POLYETHYLENE GLYCOL 3350 17 G PO PACK
17 g | Freq: Every day | ORAL | Status: AC | PRN
Start: 2022-12-15 — End: 2022-12-17

## 2022-12-15 MED ORDER — BUDESONIDE-FORMOTEROL FUMARATE 160-4.5 MCG/ACT IN AERO
Freq: Two times a day (BID) | RESPIRATORY_TRACT | Status: DC
Start: 2022-12-15 — End: 2022-12-15

## 2022-12-15 MED ORDER — ENOXAPARIN SODIUM 40 MG/0.4ML IJ SOSY
40 MG/0.4ML | Freq: Every day | INTRAMUSCULAR | Status: DC
Start: 2022-12-15 — End: 2022-12-16
  Administered 2022-12-15 – 2022-12-16 (×2): 40 mg via SUBCUTANEOUS

## 2022-12-15 MED FILL — DEXTROSE 50 % IV SOLN: 50 % | INTRAVENOUS | Qty: 50

## 2022-12-15 MED FILL — IPRATROPIUM-ALBUTEROL 0.5-2.5 (3) MG/3ML IN SOLN: RESPIRATORY_TRACT | Qty: 3

## 2022-12-15 MED FILL — HUMALOG 100 UNIT/ML IJ SOLN: 100 UNIT/ML | INTRAMUSCULAR | Qty: 1

## 2022-12-15 MED FILL — PANTOPRAZOLE SODIUM 40 MG PO TBEC: 40 MG | ORAL | Qty: 1

## 2022-12-15 MED FILL — MONOJECT FLUSH SYRINGE 0.9 % IV SOLN: 0.9 % | INTRAVENOUS | Qty: 40

## 2022-12-15 MED FILL — ALBUTEROL SULFATE (2.5 MG/3ML) 0.083% IN NEBU: RESPIRATORY_TRACT | Qty: 3

## 2022-12-15 MED FILL — EYE ITCH RELIEF 0.035 % OP SOLN: 0.035 % | OPHTHALMIC | Qty: 5

## 2022-12-15 MED FILL — LISINOPRIL 20 MG PO TABS: 20 MG | ORAL | Qty: 1

## 2022-12-15 MED FILL — SOLU-MEDROL (PF) 125 MG IJ SOLR: 125 MG | INTRAMUSCULAR | Qty: 125

## 2022-12-15 MED FILL — LYRICA 100 MG PO CAPS: 100 MG | ORAL | Qty: 3

## 2022-12-15 MED FILL — EFFER-K 20 MEQ PO TBEF: 20 MEQ | ORAL | Qty: 1

## 2022-12-15 MED FILL — ENOXAPARIN SODIUM 40 MG/0.4ML IJ SOSY: 40 MG/0.4ML | INTRAMUSCULAR | Qty: 0.4

## 2022-12-15 MED FILL — ALOGLIPTIN BENZOATE 12.5 MG PO TABS: 12.5 MG | ORAL | Qty: 2

## 2022-12-15 MED FILL — CHILDRENS ASPIRIN 81 MG PO CHEW: 81 MG | ORAL | Qty: 1

## 2022-12-15 MED FILL — ARFORMOTEROL TARTRATE 15 MCG/2ML IN NEBU: 15 MCG/2ML | RESPIRATORY_TRACT | Qty: 2

## 2022-12-15 MED FILL — PREDNISONE 20 MG PO TABS: 20 MG | ORAL | Qty: 2

## 2022-12-15 MED FILL — VITAMIN D3 25 MCG PO TABS: 25 MCG | ORAL | Qty: 5

## 2022-12-15 NOTE — ED Notes (Signed)
Report given to [facility], RN  12/15/22 2104

## 2022-12-15 NOTE — Progress Notes (Signed)
The following herbal, alternative, and/or nutritional/dietary supplement product(s) has been discontinued  per P&T/MEC approved policy:      coenzyme G38 100 MG CAPS capsule - 1 capsule daily     Please reorder upon discharge if appropriate.    Kirke Corin, PharmD  Clinical Pharmacist  [facility] [phone])

## 2022-12-15 NOTE — ED Notes (Signed)
Pt had BM in bed pan. Pt BM large, brown, & formed. Pt brief changed & repositioned in bed.      Lorelee Cover, RN  12/15/22 1918

## 2022-12-15 NOTE — ED Notes (Signed)
Pt brief, pure wik, & bed pads changed. Pt repositioned in bed.      Lorelee Cover, RN  12/15/22 [phone]

## 2022-12-15 NOTE — ED Triage Notes (Addendum)
BIB EMS from home [facility]) with CC of SOB that began around 2300. Pt was given: 1 inch nitro paste,1 Duoneb treatment, CPAP on arrival. Denies N/V/D, chest pain, dizziness.

## 2022-12-15 NOTE — ED Notes (Signed)
Pt reporting difficulty breathing, SOB, & chest tightness. Pt RR 33, HR 105, & pt grunting. NP made aware via Perfect serve. Obtained EKG & given to Dr. Wynetta Emery ER MD.    [id number] Armour, NP at bedside.     Lorelee Cover, RN  12/15/22 1921

## 2022-12-15 NOTE — H&P (Cosign Needed)
History and Physical    Date of Service:  12/15/2022  Primary Care Provider: Vianne Bulls, MD  Source of information: patient    Chief Complaint: Shortness of Breath      History of Presenting Illness:   ABBIEGALE Bryant is a 81 y.o. female with pmhx of HTN, HLD, depression, delusions, hx of CVA, asthma, OSA with home cpap, DM 2 with gastroparesis, neuropathy and LLL chronic venous ulcers, CKD III who presents with shortness of breath and wheezing. Patient states she was feeling fine and woke up early this morning feeling as though she could not breathe.  Work up in ED showed wbc 10.8k, Hg 9.2, plt 300, NA 142, K 3.9, BUN 22, Cr 0.86, glucose 49 with repeat 190 after d50% admiin, troponin 12, cxr showed cardiomegaly and mild interstital pulmonary edema. Covid and flu pending. Patient given duoneb treatment x 2 and iv steroids.  Patient placed on cpap for sleeping (patient states she has not been wearing for several days) but nursing denies any episode of hypoxia. Admitted to hospital for asthma exacerbation.         REVIEW OF SYSTEMS:  A comprehensive review of systems was negative except for that written in the History of Present Illness.     Past Medical History:   Diagnosis Date    Arthritis     Asthma     Chronic renal disease, stage III [facility]) [[id number]] 10/13/2022    Depression     Diabetes (HCC)     Fibromyalgia     Fibromyalgia     Gastroparesis     Hyperlipemia     Hypertension     Neuropathy     Psychotic disorder (HCC)     Stroke [facility])       Past Surgical History:   Procedure Laterality Date    COLONOSCOPY      HYSTERECTOMY (CERVIX STATUS UNKNOWN)       Prior to Admission medications    Medication Sig Start Date End Date Taking? Authorizing Provider   pregabalin (LYRICA) 300 MG capsule Take 1 capsule by mouth 2 times daily for 90 days. Max Daily Amount: 600 mg 08/17/22 11/15/22  Rudi Heap, APRN - CNP   omeprazole (PRILOSEC) 40 MG delayed release capsule Take 1 capsule by mouth daily 08/14/22    Vianne Bulls, MD   aspirin 81 MG chewable tablet Take 1 tablet by mouth daily 08/13/22   Vianne Bulls, MD   vitamin D3 (CHOLECALCIFEROL) 125 MCG (5000 UT) TABS tablet Take 1 tablet by mouth daily 08/13/22   Vianne Bulls, MD   SITagliptin (JANUVIA) 100 MG tablet Take 1 tablet by mouth daily 06/25/22   Vianne Bulls, MD   olmesartan (BENICAR) 20 MG tablet Take 1 tablet daily x 1 week, then 2 tabs daily thereafter  Patient not taking: Reported on 11/25/2022 06/25/22   Vianne Bulls, MD   quinapril (ACCUPRIL) 40 MG tablet quinapril 40 mg tablet   TAKE 1 TABLET BY MOUTH EVERY NIGHT FOR HIGH BLOOD PRESSURE 06/17/22   Vianne Bulls, MD   REPATHA SURECLICK 140 MG/ML SOAJ INJECT AS DIRECTED EVERY 2 WEEKS 06/17/22   Vianne Bulls, MD   insulin lispro, 1 Unit Dial, (HUMALOG/ADMELOG) 100 UNIT/ML SOPN Humalog KwikPen (U-100) Insulin 100 unit/mL subcutaneous   INJECT UP TO 50 UNITS UNDER THE SKIN DAILY AS DIRECTED    [provider]   ipratropium (ATROVENT HFA) 17 MCG/ACT inhaler Atrovent HFA 17 mcg/actuation  aerosol inhaler    [provider]   coenzyme Q10 100 MG CAPS capsule Take 1 capsule by mouth daily    Automatic Reconciliation, Ar   estradiol (ESTRACE) 1 MG tablet Take 1 tablet by mouth daily  Patient not taking: Reported on 06/10/2022 10/26/21   Automatic Reconciliation, Ar   linaclotide (LINZESS) 290 MCG CAPS capsule Take by mouth every morning (before breakfast)    Automatic Reconciliation, Ar   metoprolol tartrate (LOPRESSOR) 50 MG tablet Take 1 tablet by mouth 2 times daily  Patient not taking: Reported on 06/10/2022    Automatic Reconciliation, Ar   olopatadine (PATANOL) 0.1 % ophthalmic solution Apply 2 drops to eye 2 times daily    Automatic Reconciliation, Ar     Allergies   Allergen Reactions    Statins Myalgia     Lovastatin, pravastatin, simvastatin, atorvastatin, zetia, livalo, lovaza    Ampicillin Rash    Lidocaine Rash      Family History   Problem Relation Age of Onset    Breast  Cancer Maternal Grandmother     Cancer Father         lung ca    Hypertension Father     Diabetes Mother     Hypertension Mother       Social History:  reports that she has never smoked. She has never been exposed to tobacco smoke. She has never used smokeless tobacco. She reports that she does not drink alcohol and does not use drugs.   Social Determinants of Health     Tobacco Use: Low Risk  (11/25/2022)    Patient History     Smoking Tobacco Use: Never     Smokeless Tobacco Use: Never     Passive Exposure: Never   Alcohol Use: Not At Risk (08/06/2022)    AUDIT-C     Frequency of Alcohol Consumption: Never     Average Number of Drinks: Patient does not drink     Frequency of Binge Drinking: Never   Financial Resource Strain: Low Risk  (11/03/2022)    Overall Financial Resource Strain (CARDIA)     Difficulty of Paying Living Expenses: Not hard at all   Food Insecurity: Not on file (11/03/2022)   Transportation Needs: No Transportation Needs (11/03/2022)    PRAPARE - Therapist, art (Medical): No     Lack of Transportation (Non-Medical): No   Physical Activity: Inactive (08/06/2022)    Exercise Vital Sign     Days of Exercise per Week: 0 days     Minutes of Exercise per Session: 0 min   Stress: Stress Concern Present (08/06/2022)    [employer] of Occupational Health - Occupational Stress Questionnaire     Feeling of Stress : Very much   Social Connections: Moderately Isolated (08/06/2022)    Social Connection and Isolation Panel [NHANES]     Frequency of Communication with Friends and Family: More than three times a week     Frequency of Social Gatherings with Friends and Family: Never     Attends Religious Services: Never     Database administrator or Organizations: Yes     Attends Banker Meetings: Never     Marital Status: Divorced   Catering manager Violence: Not At Risk (11/03/2022)    Humiliation, Afraid, Rape, and Kick questionnaire     Fear of Current or Ex-Partner: No      Emotionally Abused: No     Physically  Abused: No     Sexually Abused: No   Depression: Not at risk (11/25/2022)    PHQ-2     PHQ-2 Score: 0   Housing Stability: Low Risk  (08/06/2022)    Housing Stability Vital Sign     Unable to Pay for Housing in the Last Year: No     Number of Places Lived in the Last Year: 1     Unstable Housing in the Last Year: No   Interpersonal Safety: Not At Risk (11/03/2022)    Humiliation, Afraid, Rape, and Kick questionnaire     Fear of Current or Ex-Partner: No     Emotionally Abused: No     Physically Abused: No     Sexually Abused: No   Utilities: Not At Risk (11/03/2022)    [facility] Utilities     Threatened with loss of utilities: No        Medications were reconciled to the best of my ability given all available resources at the time of admission. Route is PO if not otherwise noted.     Family and social history were personally reviewed, all pertinent and relevant details are outlined as above.    Objective:   BP 139/65   Pulse 70   Temp 97.3 F (36.3 C) (Oral)   Resp 13   Wt 99.5 kg (219 lb 5.7 oz)   SpO2 100%   BMI 37.65 kg/m         PHYSICAL EXAM:   General: Alert x oriented x 3, awake, no acute distress,   HEENT: PEERL, EOMI, moist mucus membranes  Neck: Supple, no JVD, no meningeal signs  Chest: Clear to auscultation bilaterally   CVS: RRR, S1 S2 heard, no murmurs/rubs/gallops  Abd: Soft, non-tender, non-distended, +bowel sounds   Ext: No clubbing, no cyanosis, no edema  Neuro/Psych: Pleasant mood and affect, CN 2-12 grossly intact, sensory grossly within normal limi  Cap refill: Brisk, less than 3 seconds  Pulses: 2+, symmetric in all extremities  Skin: Warm, dry, without rashes or lesions    Data Review:   I have independently reviewed and interpreted patient's lab and all other diagnostic data    Abnormal Labs Reviewed   CBC WITH AUTO DIFFERENTIAL - Abnormal; Notable for the following components:       Result Value    RBC 3.58 (*)     Hemoglobin 9.2 (*)     Hematocrit 30.2  (*)     MCH 25.7 (*)     RDW 16.8 (*)     Neutrophils % 77 (*)     Neutrophils Absolute 8.2 (*)     All other components within normal limits   COMPREHENSIVE METABOLIC PANEL - Abnormal; Notable for the following components:    Chloride 110 (*)     Anion Gap 1 (*)     Glucose 49 (*)     BUN 22 (*)     Bun/Cre Ratio 26 (*)     Albumin/Globulin Ratio 0.9 (*)     All other components within normal limits   POCT GLUCOSE - Abnormal; Notable for the following components:    POC Glucose 190 (*)     All other components within normal limits       Results       Procedure Component Value Units Date/Time    COVID-19, Rapid [[id number]]     Order Status: Sent Specimen: Nasopharyngeal Swab     Rapid influenza A/B antigens [[id number]]  Order Status: Sent Specimen: Nasopharyngeal              IMAGING:   XR CHEST PORTABLE   Final Result   Cardiomegaly and mild interstitial pulmonary edema.                ECG/ECHO:    Encounter Date: 12/15/22   EKG 12 Lead   Result Value    Ventricular Rate 65    Atrial Rate 65    P-R Interval 148    QRS Duration 74    Q-T Interval 428    QTc Calculation (Bazett) 445    P Axis 81    R Axis 68    T Axis 65    Diagnosis      Normal sinus rhythm  Normal ECG  When compared with ECG of 11-Nov-2022 12:13,  Questionable change in QRS axis  T wave inversion no longer evident in Inferior leads  Nonspecific T wave abnormality no longer evident in Lateral leads       No results found for this or any previous visit.        Notes reviewed from all clinical/nonclinical/nursing services involved in patient's clinical care. Care coordination discussions were held with appropriate clinical/nonclinical/ nursing providers based on care coordination needs.     Assessment:   Given the patient's current clinical presentation, there is a high level of concern for decompensation if discharged from the emergency department. Complex decision making was performed, which includes reviewing the patient's available past  medical records, laboratory results, and imaging studies.    Principal Problem:    Asthma due to seasonal allergies  Resolved Problems:    * No resolved hospital problems. *      Plan:     Asthma exacerbation  OSA with home cpap  -albuterol q4 prn  -prednisone 40mg  po qd for 5 days  -formeterol/budesonide bid  -wean oxygen as tolerated  -please have hospital provide cpap when sleeping    HTN  HLD  -continue lisinopril, asa    Depression  Delusions  -seroquel for sleep    DM 2  Gastroparesis  Neuropathy  LLL wound due to venous insufficiency  -insulin SSI, check BS per protocol  -continue home januvia, protonix,, lyrica  -wound care consult    CKD 3  -Cr 0.86, trend daily  -avoid nephrotoxic medications            DIET: ADULT DIET; Regular; 3 carb choices (45 gm/meal)   ISOLATION PRECAUTIONS: No active isolations  CODE STATUS: @CODESTATUS @    DVT PROPHYLAXIS: Lovenox  FUNCTIONAL STATUS PRIOR TO HOSPITALIZATION: Minimum assistance  Ambulatory status/function: Mildly impaired  EARLY MOBILITY ASSESSMENT: 4-min assistance  ANTICIPATED DISCHARGE: 24 hours  ANTICIPATED DISPOSITION: Home  EMERGENCY CONTACT/SURROGATE DECISION MAKER: Jamie Bryant [phone]    CRITICAL CARE WAS PERFORMED FOR THIS ENCOUNTER:       Signed By: Sandra Cockayne, APRN - CNP     December 15, 2022         Please note that this dictation may have been completed with [employer]ubin Milan, the computer voice recognition software.  Quite often unanticipated grammatical, syntax, homophones, and other interpretive errors are inadvertently transcribed by the computer software.  Please disregard these errors.  Please excuse any errors that have escaped final proofreading.

## 2022-12-15 NOTE — Progress Notes (Addendum)
Coarse lung sounds, repeat EKG concerning for worsening pulm edema, patient appears to take torsemide 20 mg, last dispensed 11/25/22.     -lasix 40 mg iv now  -abg penging  -can switch to bipap instead of cpap dependent on abg results  -prn nebs  -consider echo

## 2022-12-15 NOTE — ED Provider Notes (Signed)
[facility]  EMERGENCY DEPARTMENT ENCOUNTER      Pt Name: Jamie Bryant  MRN: [id number]  Birthdate 02/20/1941  Date of evaluation: 12/15/2022  Provider: Berneta LevinsLakshmi Tiffanni Scarfo, MD    CHIEF COMPLAINT       Chief Complaint   Patient presents with    Shortness of Breath         HISTORY OF PRESENT ILLNESS    HPI    Jamie Bryant is a 81 y.o. female with PMH significant for asthma, hypertension, diabetes,high cholesterol, chronic kidney disease who presents to the emergency department with complaints of shortness of breath starting last night around 11 PM.  Patient reports she has not been wearing her CPAP for the past 2 to 3 days at night because it has been another room and she did not want to move it back and forth.  Patient reports she has been having left ankle pain does not like to get up and walk with the ankle pain back-and-forth between rooms.  Denies fever, chills, nausea, vomiting, diarrhea, constipation.      Nursing Notes were reviewed.    REVIEW OF SYSTEMS       Review of Systems   Constitutional:  Negative for fever.   Respiratory:  Positive for shortness of breath. Negative for cough.    Cardiovascular:  Negative for chest pain.   Gastrointestinal:  Negative for abdominal pain.   Musculoskeletal:  Positive for arthralgias. Negative for myalgias.   Skin:  Negative for wound.           PAST MEDICAL HISTORY     Past Medical History:   Diagnosis Date    Arthritis     Asthma     Chronic renal disease, stage III [facility](HCC) [347425][301279] 10/13/2022    Depression     Diabetes (HCC)     Fibromyalgia     Fibromyalgia     Gastroparesis     Hyperlipemia     Hypertension     Neuropathy     Psychotic disorder (HCC)     Stroke (HCC)          SURGICAL HISTORY       Past Surgical History:   Procedure Laterality Date    COLONOSCOPY      HYSTERECTOMY (CERVIX STATUS UNKNOWN)           CURRENT MEDICATIONS       Previous Medications    ASPIRIN 81 MG CHEWABLE TABLET    Take 1 tablet by mouth daily    COENZYME Q10 100 MG CAPS CAPSULE    Take  1 capsule by mouth daily    ESTRADIOL (ESTRACE) 1 MG TABLET    Take 1 tablet by mouth daily    INSULIN LISPRO, 1 UNIT DIAL, (HUMALOG/ADMELOG) 100 UNIT/ML SOPN    Humalog KwikPen (U-100) Insulin 100 unit/mL subcutaneous   INJECT UP TO 50 UNITS UNDER THE SKIN DAILY AS DIRECTED    IPRATROPIUM (ATROVENT HFA) 17 MCG/ACT INHALER    Atrovent HFA 17 mcg/actuation aerosol inhaler    LINACLOTIDE (LINZESS) 290 MCG CAPS CAPSULE    Take by mouth every morning (before breakfast)    METOPROLOL TARTRATE (LOPRESSOR) 50 MG TABLET    Take 1 tablet by mouth 2 times daily    OLMESARTAN (BENICAR) 20 MG TABLET    Take 1 tablet daily x 1 week, then 2 tabs daily thereafter    OLOPATADINE (PATANOL) 0.1 % OPHTHALMIC SOLUTION    Apply 2 drops to eye  2 times daily    OMEPRAZOLE (PRILOSEC) 40 MG DELAYED RELEASE CAPSULE    Take 1 capsule by mouth daily    PREGABALIN (LYRICA) 300 MG CAPSULE    Take 1 capsule by mouth 2 times daily for 90 days. Max Daily Amount: 600 mg    QUINAPRIL (ACCUPRIL) 40 MG TABLET    quinapril 40 mg tablet   TAKE 1 TABLET BY MOUTH EVERY NIGHT FOR HIGH BLOOD PRESSURE    REPATHA SURECLICK 140 MG/ML SOAJ    INJECT AS DIRECTED EVERY 2 WEEKS    SITAGLIPTIN (JANUVIA) 100 MG TABLET    Take 1 tablet by mouth daily    VITAMIN D3 (CHOLECALCIFEROL) 125 MCG (5000 UT) TABS TABLET    Take 1 tablet by mouth daily       ALLERGIES     Statins, Ampicillin, and Lidocaine    FAMILY HISTORY       Family History   Problem Relation Age of Onset    Breast Cancer Maternal Grandmother     Cancer Father         lung ca    Hypertension Father     Diabetes Mother     Hypertension Mother           SOCIAL HISTORY       Social History     Socioeconomic History    Marital status: Divorced    Number of children: 2    Highest education level: Bachelor's degree (e.g., BA, AB, BS)   Tobacco Use    Smoking status: Never     Passive exposure: Never    Smokeless tobacco: Never   Vaping Use    Vaping Use: Never used   Substance and Sexual Activity    Alcohol use:  No    Drug use: No    Sexual activity: Not Currently     Comment: divorced,2 children,retired,living in [facility]   Social History Narrative    Nothing in the past 5 years     Social Determinants of Health     Financial Resource Strain: Low Risk  (11/03/2022)    Overall Financial Resource Strain (CARDIA)     Difficulty of Paying Living Expenses: Not hard at all   Transportation Needs: No Transportation Needs (11/03/2022)    PRAPARE - Therapist, art (Medical): No     Lack of Transportation (Non-Medical): No   Physical Activity: Inactive (08/06/2022)    Exercise Vital Sign     Days of Exercise per Week: 0 days     Minutes of Exercise per Session: 0 min   Stress: Stress Concern Present (08/06/2022)    [employer] of Occupational Health - Occupational Stress Questionnaire     Feeling of Stress : Very much   Social Connections: Moderately Isolated (08/06/2022)    Social Connection and Isolation Panel [NHANES]     Frequency of Communication with Friends and Family: More than three times a week     Frequency of Social Gatherings with Friends and Family: Never     Attends Religious Services: Never     Database administrator or Organizations: Yes     Attends Banker Meetings: Never     Marital Status: Divorced   Catering manager Violence: Not At Risk (11/03/2022)    Humiliation, Afraid, Rape, and Kick questionnaire     Fear of Current or Ex-Partner: No     Emotionally Abused: No  Physically Abused: No     Sexually Abused: No   Housing Stability: Low Risk  (08/06/2022)    Housing Stability Vital Sign     Unable to Pay for Housing in the Last Year: No     Number of Places Lived in the Last Year: 1     Unstable Housing in the Last Year: No       SCREENINGS                               CIWA Assessment  BP: (!) 137/56  Pulse: 72                 PHYSICAL EXAM       ED Triage Vitals   BP Temp Temp Source Pulse Respirations SpO2 Height Weight - Scale   12/15/22 0448 12/15/22  0622 12/15/22 0622 12/15/22 0453 12/15/22 0453 12/15/22 0448 -- 12/15/22 0509   (!) 155/59 97.3 F (36.3 C) Oral 69 16 98 %  99.5 kg (219 lb 5.7 oz)       Physical Exam  Vitals and nursing note reviewed.   Constitutional:       Appearance: Normal appearance. She is obese.   HENT:      Head: Normocephalic and atraumatic.      Nose: Nose normal.      Mouth/Throat:      Mouth: Mucous membranes are moist.   Eyes:      Extraocular Movements: Extraocular movements intact.      Conjunctiva/sclera: Conjunctivae normal.      Pupils: Pupils are equal, round, and reactive to light.   Cardiovascular:      Rate and Rhythm: Normal rate and regular rhythm.      Pulses: Normal pulses.   Pulmonary:      Effort: Pulmonary effort is normal. No respiratory distress.      Breath sounds: Wheezing and rhonchi present.   Abdominal:      General: Bowel sounds are normal.      Palpations: Abdomen is soft.      Tenderness: There is no abdominal tenderness.   Musculoskeletal:         General: Normal range of motion.      Cervical back: Normal range of motion.      Comments: Left ankle with swelling and erythema   Skin:     General: Skin is warm.      Capillary Refill: Capillary refill takes less than 2 seconds.   Neurological:      General: No focal deficit present.      Mental Status: She is alert and oriented to person, place, and time.   Psychiatric:         Mood and Affect: Mood normal.         DIAGNOSTIC RESULTS       XR CHEST PORTABLE   Final Result   Cardiomegaly and mild interstitial pulmonary edema.               Labs Reviewed   CBC WITH AUTO DIFFERENTIAL - Abnormal; Notable for the following components:       Result Value    RBC 3.58 (*)     Hemoglobin 9.2 (*)     Hematocrit 30.2 (*)     MCH 25.7 (*)     RDW 16.8 (*)     Neutrophils % 77 (*)     Neutrophils Absolute 8.2 (*)  All other components within normal limits   COMPREHENSIVE METABOLIC PANEL - Abnormal; Notable for the following components:    Chloride 110 (*)     Anion Gap  1 (*)     Glucose 49 (*)     BUN 22 (*)     Bun/Cre Ratio 26 (*)     Albumin/Globulin Ratio 0.9 (*)     All other components within normal limits   POCT GLUCOSE - Abnormal; Notable for the following components:    POC Glucose 190 (*)     All other components within normal limits   EXTRA TUBES HOLD   TROPONIN   BRAIN NATRIURETIC PEPTIDE           EMERGENCY DEPARTMENT COURSE and DIFFERENTIAL DIAGNOSIS/MDM:   Vitals:    Vitals:    12/15/22 0603 12/15/22 0613 12/15/22 0622 12/15/22 0653   BP: (!) 131/51 (!) 137/56     Pulse: 69 72  72   Resp: 14 16  14    Temp:   97.3 F (36.3 C)    TempSrc:   Oral    SpO2: 100% 100%  100%   Weight:             Medical Decision Making  81 year old female with history of COPD, high cholesterol, diabetes, hypertension presents with complaints of shortness of breath starting this morning like her asthma.  Patient reports she has not been using her CPAP for the past 2 nights pain.  Denies chest pain.  Plan-EKG, chest x-ray, CBC/CMP/cardiac enzymes, DuoNebs, Solu-Medrol.      Amount and/or Complexity of Data Reviewed  Labs: ordered.  Radiology: ordered.  ECG/medicine tests: ordered.    Risk  Prescription drug management.            REASSESSMENT      [facility] Consult for Admission  7:15 AM    ED Room Number: ER16/16  Patient Name and age:  Jamie Bryant 81 y.o.  female  Working Diagnosis:   1. Moderate asthma with exacerbation, unspecified whether persistent    2. Acute pulmonary edema (HCC)    3. Hypoglycemia        COVID-19 Suspicion: No  Sepsis present:  No  Reassessment needed: No  Code Status:  Full Code  Readmission: No  Isolation Requirements: no  Recommended Level of Care: telemetry  Department: [facility] - [phone]        FINAL IMPRESSION      1. Moderate asthma with exacerbation, unspecified whether persistent    2. Acute pulmonary edema (HCC)    3. Hypoglycemia          DISPOSITION/PLAN   DISPOSITION            (Please note that portions of this note were completed  with a voice recognition program.  Efforts were made to edit the dictations but occasionally words are mis-transcribed.)    Benita Stabile, MD (electronically signed)  Attending Emergency Physician            Estelle Grumbles, MD  12/15/22 [phone]

## 2022-12-15 NOTE — ED Notes (Signed)
Report given to [place].     Lorelee Cover, RN  12/15/22 1945

## 2022-12-15 NOTE — Wound Image (Signed)
WOCN Note:     New consult for leg wounds.  Seen in ER16    81 y.o. y/o female admitted on 12/15/2022  Admitted for seasonal allergies   History of DM, fibromyalgia, gastroparesis, neuropathy, CVA, psychotic disorder    Diet: reg 3 carb choice           Assessment:   Appropriately conversational and reports pain with wound cleaning.  Able to turn independently onto right side for sacral assessment.    Continent & Wearing pull ups  Surface: ER stretcher    Bilateral heels intact and without erythema.  Lifts them freely and independently.     Buttocks and sacrum intact without erythema; sacral foam in use.    1. POA LLL chronic wound consistent with venous stasis  7 x 7 x 0.1 cm  Scattered yellow glaze within in red tissue that could be partially cleaned away with Vashe  DP pulses bilaterally  Tx: covered with silver foam      Wound Recommendations:    LLL: clean with Vashe by resting a Vashe-wet 4x4 on wound for 10 min.  Pat dry and cover with silver foam ([employer]).  Change daily while admitted to hospital and change every 3 days after discharge.     PI Prevention:  Turn/reposition approximately every 2 hours  Offload heels with heels hanging off end of pillow at all times while in bed.    Transition of Care: Plan to follow weekly and as needed while admitted to hospital.     Alena Bills, BSN, RN, [facility]  Certified Wound, Ostomy, Continence Nurse  office [phone]  Available via [facility]

## 2022-12-16 LAB — EKG 12-LEAD
Atrial Rate: 65 {beats}/min
Atrial Rate: 102 {beats}/min
Diagnosis: NORMAL
P Axis: 81 degrees
P Axis: 74 degrees
P-R Interval: 148 ms
P-R Interval: 136 ms
Q-T Interval: 428 ms
Q-T Interval: 344 ms
QRS Duration: 74 ms
QRS Duration: 76 ms
QTc Calculation (Bazett): 445 ms
QTc Calculation (Bazett): 448 ms
R Axis: 68 degrees
R Axis: 51 degrees
T Axis: 65 degrees
T Axis: 49 degrees
Ventricular Rate: 65 {beats}/min
Ventricular Rate: 102 {beats}/min

## 2022-12-16 LAB — ARTERIAL BLOOD GAS, POC
Base Excess: 1 mmol/L
POC Allen's Test: POSITIVE
POC HCO3: 26.3 MMOL/L — ABNORMAL HIGH (ref 22–26)
POC O2 SAT: 93.7 % (ref 92–97)
POC PO2: 71 MMHG — ABNORMAL LOW (ref 80–100)
POC pCO2: 43.6 MMHG (ref 35.0–45.0)
POC pH: 7.39 (ref 7.35–7.45)

## 2022-12-16 LAB — POCT GLUCOSE
POC Glucose: 299 mg/dL — ABNORMAL HIGH (ref 65–117)
POC Glucose: 177 mg/dL — ABNORMAL HIGH (ref 65–117)
POC Glucose: 145 mg/dL — ABNORMAL HIGH (ref 65–117)
POC Glucose: 238 mg/dL — ABNORMAL HIGH (ref 65–117)

## 2022-12-16 MED ORDER — GLUCOSE 4 G PO CHEW
4 g | ORAL | Status: AC | PRN
Start: 2022-12-16 — End: 2022-12-17

## 2022-12-16 MED ORDER — IPRATROPIUM-ALBUTEROL 0.5-2.5 (3) MG/3ML IN SOLN
RESPIRATORY_TRACT | Status: DC | PRN
Start: 2022-12-16 — End: 2022-12-17

## 2022-12-16 MED ORDER — DEXTROSE 10 % IV BOLUS
INTRAVENOUS | Status: AC | PRN
Start: 2022-12-16 — End: 2022-12-17

## 2022-12-16 MED ORDER — PREDNISONE 20 MG PO TABS
20 MG | Freq: Every day | ORAL | Status: AC
Start: 2022-12-16 — End: 2022-12-20
  Administered 2022-12-16 – 2022-12-17 (×2): 60 mg via ORAL

## 2022-12-16 MED ORDER — FUROSEMIDE 10 MG/ML IJ SOLN
10 MG/ML | Freq: Once | INTRAMUSCULAR | Status: AC
Start: 2022-12-16 — End: 2022-12-15
  Administered 2022-12-16: 03:00:00 40 mg via INTRAVENOUS

## 2022-12-16 MED ORDER — ALBUTEROL SULFATE (2.5 MG/3ML) 0.083% IN NEBU
Freq: Four times a day (QID) | RESPIRATORY_TRACT | Status: AC
Start: 2022-12-16 — End: 2022-12-17
  Administered 2022-12-16 – 2022-12-17 (×6): 2.5 mg via RESPIRATORY_TRACT

## 2022-12-16 MED ORDER — GLUCAGON HCL RDNA (DIAGNOSTIC) 1 MG IJ SOLR
1 MG | INTRAMUSCULAR | Status: AC | PRN
Start: 2022-12-16 — End: 2022-12-17

## 2022-12-16 MED ORDER — DEXTROSE 10 % IV SOLN
10 % | INTRAVENOUS | Status: AC | PRN
Start: 2022-12-16 — End: 2022-12-17

## 2022-12-16 MED ORDER — ENOXAPARIN SODIUM 30 MG/0.3ML IJ SOSY
30 MG/0.3ML | Freq: Two times a day (BID) | INTRAMUSCULAR | Status: AC
Start: 2022-12-16 — End: 2022-12-17
  Administered 2022-12-17 (×2): 30 mg via SUBCUTANEOUS

## 2022-12-16 MED FILL — QUETIAPINE FUMARATE 25 MG PO TABS: 25 MG | ORAL | Qty: 1

## 2022-12-16 MED FILL — ALBUTEROL SULFATE (2.5 MG/3ML) 0.083% IN NEBU: RESPIRATORY_TRACT | Qty: 3

## 2022-12-16 MED FILL — HUMALOG 100 UNIT/ML IJ SOLN: 100 UNIT/ML | INTRAMUSCULAR | Qty: 1

## 2022-12-16 MED FILL — LYRICA 100 MG PO CAPS: 100 MG | ORAL | Qty: 3

## 2022-12-16 MED FILL — CHILDRENS ASPIRIN 81 MG PO CHEW: 81 MG | ORAL | Qty: 1

## 2022-12-16 MED FILL — ENOXAPARIN SODIUM 40 MG/0.4ML IJ SOSY: 40 MG/0.4ML | INTRAMUSCULAR | Qty: 0.4

## 2022-12-16 MED FILL — LISINOPRIL 20 MG PO TABS: 20 MG | ORAL | Qty: 1

## 2022-12-16 MED FILL — ARFORMOTEROL TARTRATE 15 MCG/2ML IN NEBU: 15 MCG/2ML | RESPIRATORY_TRACT | Qty: 2

## 2022-12-16 MED FILL — ALOGLIPTIN BENZOATE 12.5 MG PO TABS: 12.5 MG | ORAL | Qty: 2

## 2022-12-16 MED FILL — VITAMIN D3 25 MCG (1000 UT) PO TABS: 25 MCG (1000 UT) | ORAL | Qty: 1

## 2022-12-16 MED FILL — FUROSEMIDE 10 MG/ML IJ SOLN: 10 MG/ML | INTRAMUSCULAR | Qty: 4

## 2022-12-16 MED FILL — DEXTROSE 10 % IV SOLN: 10 % | INTRAVENOUS | Qty: 1000

## 2022-12-16 MED FILL — EYE ITCH RELIEF 0.035 % OP SOLN: 0.035 % | OPHTHALMIC | Qty: 5

## 2022-12-16 MED FILL — VITAMIN D3 25 MCG (1000 UT) PO TABS: 25 MCG (1000 UT) | ORAL | Qty: 5

## 2022-12-16 MED FILL — PREDNISONE 20 MG PO TABS: 20 MG | ORAL | Qty: 3

## 2022-12-16 MED FILL — PANTOPRAZOLE SODIUM 40 MG PO TBEC: 40 MG | ORAL | Qty: 1

## 2022-12-16 MED FILL — ACETAMINOPHEN 325 MG PO TABS: 325 MG | ORAL | Qty: 2

## 2022-12-16 NOTE — Progress Notes (Signed)
Lovenox Monitoring  Indication: DVT Prophylaxis  Recent Labs     12/15/22  0506   HGB 9.2*   PLT 300     Current Weight: 108.4 kg  Est. CrCl = 64 ml/min  Current Dose: 40 mg subcutaneously every 24 hours.  Plan: Change to 30 mg sc every 12 hours for wt 101-150.9 kg

## 2022-12-16 NOTE — Progress Notes (Signed)
Hospital Progress Note  Cristine Polio, MD   Answering service: [phone] OR    [facility]      NAME:  Jamie Bryant   DOB:   1941-03-22   MRN:  [id number]     Date/Time:  12/16/2022 7:46 AM    Plan:     Prednisone 60 mg  Jet Neb  Encourage activity  PT/OT  Risk of Deterioration: Low  []            Moderate  [x]            High  []                  Assessment:     Principal Problem:    Asthma due to seasonal allergies     Jet neb     Steroids    Active Problems:    Dyslipidemia     Repatha      Essential (primary) hypertension     Titrate home meds      Delusion (HCC)     Seroquel at hs      Dementia without behavioral disturbance (HCC)     Stable currently      Obstructive sleep apnea     Home CPAP      Vascular parkinsonism (HCC)    Type 2 diabetes mellitus with diabetic neuropathy     Correctinal scale      Chronic renal disease, stage III (HCC) [301279]     Stable      Ulcer of left lower extremity with muscle involvement without evidence of necrosis (HCC)     Wound care    Acute respiratory failure (HCC)    Wean oxygen  Resolved Problems:    * No resolved hospital problems. *        Admitting notes:81 y.o. female with pmhx of HTN, HLD, depression, delusions, hx of CVA, asthma, OSA with home cpap, DM 2 with gastroparesis, neuropathy and LLL chronic venous ulcers, CKD III who presents with shortness of breath and wheezing. Patient states she was feeling fine and woke up early this morning feeling as though she could not breathe.  Work up in ED showed wbc 10.8k, Hg 9.2, plt 300, NA 142, K 3.9, BUN 22, Cr 0.86, glucose 49 with repeat 190 after d50% admiin, troponin 12, cxr showed cardiomegaly and mild interstital pulmonary edema. Covid and flu pending. Patient given duoneb treatment x 2 and iv steroids.  Patient placed on cpap for sleeping (patient states she has not been wearing for several days) but nursing denies any episode of hypoxia. Admitted to hospital for asthma exacerbation.         Subjective:      Feeling better - c/o wheezing with activity  11 Point Review of Systems:   Negative except no cp    []             Unable to obtain ROS due to:       []             mental status change []             sedated []             intubated     Social History     Tobacco Use    Smoking status: Never     Passive exposure: Never    Smokeless tobacco: Never   Substance Use Topics    Alcohol use: No     Medications reviewed:  Current Facility-Administered Medications   Medication Dose Route Frequency    albuterol (PROVENTIL) (2.5 MG/3ML) 0.083% nebulizer solution 2.5 mg  2.5 mg Nebulization 4x Daily RT    predniSONE (DELTASONE) tablet 60 mg  60 mg Oral Daily    sodium chloride flush 0.9 % injection 5-40 mL  5-40 mL IntraVENous 2 times per day    sodium chloride flush 0.9 % injection 5-40 mL  5-40 mL IntraVENous PRN    0.9 % sodium chloride infusion   IntraVENous PRN    enoxaparin (LOVENOX) injection 40 mg  40 mg SubCUTAneous Daily    ondansetron (ZOFRAN-ODT) disintegrating tablet 4 mg  4 mg Oral Q8H PRN    Or    ondansetron (ZOFRAN) injection 4 mg  4 mg IntraVENous Q6H PRN    polyethylene glycol (GLYCOLAX) packet 17 g  17 g Oral Daily PRN    acetaminophen (TYLENOL) tablet 650 mg  650 mg Oral Q6H PRN    Or    acetaminophen (TYLENOL) suppository 650 mg  650 mg Rectal Q6H PRN    arformoterol 15 mcg-budesonide 0.25 mg neb solution   Nebulization BID RT    aspirin chewable tablet 81 mg  81 mg Oral Daily    linaclotide (LINZESS) capsule 290 mcg  290 mcg Oral QAM AC    ketotifen fumarate (ZADITOR) 0.035 % ophthalmic solution 1 drop  1 drop Both Eyes BID    pantoprazole (PROTONIX) tablet 40 mg  40 mg Oral QAM AC    pregabalin (LYRICA) capsule 300 mg  300 mg Oral BID    lisinopril (PRINIVIL;ZESTRIL) tablet 20 mg  20 mg Oral Daily    alogliptin (NESINA) tablet 25 mg  25 mg Oral Daily    Vitamin D (CHOLECALCIFEROL) tablet 5,000 Units  5,000 Units Oral Daily    insulin lispro (HUMALOG) injection vial 0-4 Units  0-4 Units SubCUTAneous TID  WC    insulin lispro (HUMALOG) injection vial 0-4 Units  0-4 Units SubCUTAneous Nightly    QUEtiapine (SEROQUEL) tablet 12.5 mg  12.5 mg Oral Nightly    ipratropium 0.5 mg-albuterol 2.5 mg (DUONEB) nebulizer solution 1 Dose  1 Dose Inhalation Q4H PRN        Objective:   Vitals:  BP 112/65   Pulse 83   Temp 98.3 F (36.8 C) (Oral)   Resp 20   Ht 1.626 m (5\' 4" )   Wt 108.4 kg (239 lb)   SpO2 98%   BMI 41.02 kg/m   Temp (24hrs), Avg:98.2 F (36.8 C), Min:97.6 F (36.4 C), Max:98.5 F (36.9 C)           Last 24hr Input/Output:    Intake/Output Summary (Last 24 hours) at 12/16/2022 0746  Last data filed at 12/15/2022 2057  Gross per 24 hour   Intake 30 ml   Output 700 ml   Net -670 ml        PHYSICAL EXAM:  General:    Alert, cooperative, no distress, appears stated age.     Head:   Normocephalic, without obvious abnormality, atraumatic.  Eyes:   Conjunctivae/corneas clear.  PERRLA  Nose:  Nares normal. No drainage or sinus tenderness.  Throat:    Lips, mucosa, and tongue normal.  No Thrush  Neck:  Supple, symmetrical,  no adenopathy, thyroid: non tender    no carotid bruit and no JVD.  Back:    Symmetric,  No CVA tenderness.  Lungs:   Decrease bs with wheezing  Chest wall:  No tenderness  or deformity. No Accessory muscle use.  Heart:   Regular rate and rhythm,  no murmur, rub or gallop.  Abdomen:   Soft, non-tender. Not distended.  Bowel sounds normal. No masses  Extremities: Extremities normal, atraumatic, No cyanosis.  No edema. No clubbing  Skin:     Texture, turgor normal. No rashes or lesions.  Not Jaundiced  Lymph nodes: Cervical, supraclavicular normal.  Psych:  Good insight.  Not depressed.  Not anxious or agitated.  Neurologic: Normal strength, Alert and oriented X 3.       Lab Data Reviewed:    Recent Labs     12/15/22  0506   WBC 10.8   HGB 9.2*   HCT 30.2*   PLT 300     Recent Labs     12/15/22  0506   NA 142   K 3.9   CL 110*   CO2 31   BUN 22*   ALT 19     No results found for: "GLUCPOC"  No  results for input(s): "PH", "PCO2", "PO2", "HCO3", "FIO2" in the last 72 hours.  No results for input(s): "INR" in the last 72 hours.  ___________________________________________________  ___________________________________________________    Attending Physician: Esperanza Richters, MD
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
## 2022-12-16 NOTE — Progress Notes (Signed)
Spiritual Care Partner Volunteer visited patient at [facility] in [place] on 12/16/2022   Documented by:  Marda Stalker, MDIV, [facility]
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
## 2022-12-17 LAB — POCT GLUCOSE
POC Glucose: 233 mg/dL — ABNORMAL HIGH (ref 65–117)
POC Glucose: 137 mg/dL — ABNORMAL HIGH (ref 65–117)
POC Glucose: 137 mg/dL — ABNORMAL HIGH (ref 65–117)

## 2022-12-17 LAB — BASIC METABOLIC PANEL W/ REFLEX TO MG FOR LOW K
Anion Gap: 5 mmol/L (ref 5–15)
BUN/Creatinine Ratio: 28 — ABNORMAL HIGH (ref 12–20)
BUN: 21 MG/DL — ABNORMAL HIGH (ref 6–20)
CO2: 29 mmol/L (ref 21–32)
Calcium: 8.7 MG/DL (ref 8.5–10.1)
Chloride: 108 mmol/L (ref 97–108)
Creatinine: 0.76 MG/DL (ref 0.55–1.02)
Est, Glom Filt Rate: >60 mL/min/{1.73_m2} (ref >60)
Glucose: 167 mg/dL — ABNORMAL HIGH (ref 65–100)
Potassium: 4.4 mmol/L (ref 3.5–5.1)
Sodium: 142 mmol/L (ref 136–145)

## 2022-12-17 MED ORDER — ALBUTEROL SULFATE HFA 108 (90 BASE) MCG/ACT IN AERS
108 (90 Base) MCG/ACT | Freq: Four times a day (QID) | RESPIRATORY_TRACT | 0 refills | Status: AC | PRN
Start: 2022-12-17 — End: ?

## 2022-12-17 MED ORDER — PREDNISONE 20 MG PO TABS
20 MG | ORAL_TABLET | Freq: Every day | ORAL | 0 refills | Status: AC
Start: 2022-12-17 — End: 2022-12-22

## 2022-12-17 MED ORDER — BUDESONIDE 0.25 MG/2ML IN SUSP
0.25 MG/2ML | Freq: Two times a day (BID) | RESPIRATORY_TRACT | 11 refills | Status: AC
Start: 2022-12-17 — End: ?

## 2022-12-17 MED ORDER — QUETIAPINE FUMARATE 25 MG PO TABS
25 | ORAL_TABLET | Freq: Every evening | ORAL | 3 refills | Status: DC
Start: 2022-12-17 — End: 2024-08-23

## 2022-12-17 MED ORDER — BUDESONIDE 0.25 MG/2ML IN SUSP
0.25 MG/2ML | Freq: Two times a day (BID) | RESPIRATORY_TRACT | 3 refills | Status: AC
Start: 2022-12-17 — End: ?

## 2022-12-17 MED ORDER — ALBUTEROL SULFATE HFA 108 (90 BASE) MCG/ACT IN AERS
108 (90 Base) MCG/ACT | Freq: Four times a day (QID) | RESPIRATORY_TRACT | 1 refills | Status: AC | PRN
Start: 2022-12-17 — End: ?

## 2022-12-17 MED ORDER — ARFORMOTEROL TARTRATE 15 MCG/2ML IN NEBU
15 MCG/2ML | Freq: Two times a day (BID) | RESPIRATORY_TRACT | 3 refills | Status: AC
Start: 2022-12-17 — End: ?

## 2022-12-17 MED ORDER — PREDNISONE 20 MG PO TABS
20 MG | ORAL_TABLET | Freq: Every day | ORAL | 0 refills | Status: DC
Start: 2022-12-17 — End: 2022-12-17
  Filled 2022-12-17: qty 10, 5d supply, fill #0

## 2022-12-17 MED ORDER — ALBUTEROL SULFATE (2.5 MG/3ML) 0.083% IN NEBU
Freq: Four times a day (QID) | RESPIRATORY_TRACT | 3 refills | Status: AC
Start: 2022-12-17 — End: ?
  Filled 2022-12-17: qty 8.5, 25d supply, fill #0

## 2022-12-17 MED ORDER — ALBUTEROL SULFATE (2.5 MG/3ML) 0.083% IN NEBU
Freq: Four times a day (QID) | RESPIRATORY_TRACT | 3 refills | Status: DC
Start: 2022-12-17 — End: 2022-12-17

## 2022-12-17 MED ORDER — QUETIAPINE FUMARATE 25 MG PO TABS
25 MG | ORAL_TABLET | Freq: Every evening | ORAL | 3 refills | Status: DC
Start: 2022-12-17 — End: 2022-12-17
  Filled 2022-12-17: qty 45, 90d supply, fill #0

## 2022-12-17 MED FILL — PREDNISONE 20 MG PO TABS: 20 MG | ORAL | Qty: 3

## 2022-12-17 MED FILL — PANTOPRAZOLE SODIUM 40 MG PO TBEC: 40 MG | ORAL | Qty: 1

## 2022-12-17 MED FILL — ALBUTEROL SULFATE (2.5 MG/3ML) 0.083% IN NEBU: RESPIRATORY_TRACT | Qty: 3

## 2022-12-17 MED FILL — LOVENOX 30 MG/0.3ML IJ SOSY: 30 MG/0.3ML | INTRAMUSCULAR | Qty: 0.3

## 2022-12-17 MED FILL — QUETIAPINE FUMARATE 25 MG PO TABS: 25 MG | ORAL | Qty: 1

## 2022-12-17 MED FILL — CHILDRENS ASPIRIN 81 MG PO CHEW: 81 MG | ORAL | Qty: 1

## 2022-12-17 MED FILL — VITAMIN D3 25 MCG (1000 UT) PO TABS: 25 MCG (1000 UT) | ORAL | Qty: 5

## 2022-12-17 MED FILL — ARFORMOTEROL TARTRATE 15 MCG/2ML IN NEBU: 15 MCG/2ML | RESPIRATORY_TRACT | Qty: 2

## 2022-12-17 MED FILL — ACETAMINOPHEN 325 MG PO TABS: 325 MG | ORAL | Qty: 2

## 2022-12-17 MED FILL — LYRICA 100 MG PO CAPS: 100 MG | ORAL | Qty: 3

## 2022-12-17 MED FILL — ALOGLIPTIN BENZOATE 25 MG PO TABS: 25 MG | ORAL | Qty: 1

## 2022-12-17 MED FILL — LISINOPRIL 20 MG PO TABS: 20 MG | ORAL | Qty: 1

## 2022-12-17 NOTE — Care Coordination-Inpatient (Signed)
Care Management Initial Assessment       RUR:Obs  Readmission? No  1st IM letter given? No  1st Tricare letter given: No    CM met with patient to discuss discharge planning. Patient needs transportation home via a [place] Lance, Attending has called prescriptions to [facility] pharmacy, a referral for a neb machine had been sent via [employer]. CM faxed [place] resumption order to [place] at [phone] and also notified her of discharge today.     11:45 AM. CM received a call from [facility] to say they could not fill Budesonide, Albuterol and the Bravana. CM called [facility] and spoke with Yong Channel, pharmacist regarding the meds, he said could fill the Albuterol neb solution, but the Bravana will be tomorrow and the budosenide has a Office manager wide. He will need to call the attending.     Awaiting neb machine delivery. CM already spoke with Janett Billow with [employer].     Frederica Kuster, [place], RN, CM     12/16/22 1505   Service Assessment   Patient Orientation Alert and Oriented   Cognition Alert   History Provided By Patient   Primary Caregiver [place]   PCP Verified by CM Yes   Current Functional Level Independent in ADLs/IADLs   Can patient return to prior living arrangement Yes   Financial Resources Medicare   Community Resources ECF/Home Care   Social/Functional History   Lives With Alone   Type of Home Apartment   Home Layout One level   Home Access Elevator   Bathroom Shower/Tub Walk-in shower  (Pt reports shower chair does not fit in shower)   [place] bars in shower;Shower chair  (Pt reports shower chair does not fit in shower)   [place], rolling;Rollator   Receives Help From Family   Active Driver No   Mode of Sports administrator   Type of [place] PT;OT;Nursing Services   Condition of Participation: Discharge Planning   The Patient and/or Patient Representative was provided with a Choice of Provider? Patient    The Patient and/Or Patient Representative agree with the Discharge Plan? Yes   Freedom of Choice list was provided with basic dialogue that supports the patient's individualized plan of care/goals, treatment preferences, and shares the quality data associated with the providers?  Yes

## 2022-12-17 NOTE — Progress Notes (Signed)
Physician Progress Note      PATIENTOHANA, Jamie Bryant  CSN #:                  [id number]  DOB:                       03-19-1941  ADMIT DATE:       12/15/2022 4:43 AM  DISCH DATE:        12/17/2022 5:00 PM  RESPONDING  PROVIDER #:        Erin Hearing MD          QUERY TEXT:    Patient admitted with AE Asthma. Noted documentation of acute respiratory   failure beginning in Dec 19 Progress note. There is documentation of no resp   distress. In order to support the diagnosis of acute respiratory failure,   please include additional clinical indicators in your documentation.  Or   please document if the diagnosis of acute respiratory failure has been ruled   out after further study.    The medical record reflects the following:  Risk Factors: AE Asthma, noncompliance with Cpap    Clinical Indicators:  12/18 04:01 97.3, 70, 13, 139/65, 100% on r/a  16:01 100, 26 92% on 2L O2  19:01 92, 22, 99% on 4L O2    ER- pt with SOB, has not worn cpap x 2-3 days. no fevers or n/v/d. obese.   Pulmonary effort is normal. No respiratory distress.  Breath sounds: Wheezing and rhonchi present. assess: Mod Asthma exac, acute   pul edema    H&P- no acute distress, chest clear. assess: AE Asthma, OSA    12/18 Nocturnist- Coarse lung sounds, repeat EKG concerning for worsening pulm   edema    12/19 PN: Asthma 2/2 seasonal allergies, OSA, Acute Resp Failure    Treatment: O2 4L, Duonebs, Solumedrol, CPAP    Acute Respiratory Failure Clinical Indicators per 45M MS-DRG Training Guide and   Quick Reference Guide:  pO2 < 60 mmHg or SpO2 (pulse oximetry) < 91% breathing room air  pCO2 > 50 and pH < 7.35  P/F ratio (pO2 / FIO2) < 300  pO2 decrease or pCO2 increase by 10 mmHg from baseline (if known)  Supplemental oxygen of 40% or more  Presence of respiratory distress, tachypnea, dyspnea, shortness of breath,   wheezing  Unable to speak in complete sentences  Use of accessory muscles to breathe  Extreme anxiety and feeling of  impending doom  Tripod position  Confusion/altered mental status/obtunded    Thank you,  Juanetta Gosling RN  Clinical Documentation  [phone], or via Perfect Serve  Options provided:  -- Acute Respiratory Failure as evidenced by, Please document evidence.  -- Acute Respiratory Failure ruled out after study  -- Other - I will add my own diagnosis  -- Disagree - Not applicable / Not valid  -- Disagree - Clinically unable to determine / Unknown  -- Refer to Clinical Documentation Reviewer    PROVIDER RESPONSE TEXT:    Acute Respiratory Failure has been ruled out after study.    Query created by: Juanetta Gosling on 12/17/2022 12:47 PM      Electronically signed by:  Erin Hearing MD 12/17/2022 6:06 PM

## 2022-12-17 NOTE — Discharge Summary (Addendum)
Discharge Summary       PATIENT ID: Jamie Bryant  MRN: [id number]   DATE OF BIRTH: 1941/03/27    DATE OF ADMISSION: 12/15/2022  4:43 AM    DATE OF DISCHARGE: 12/17/2022   PRIMARY CARE PROVIDER: Vianne Bulls, MD     ATTENDING PHYSICIAN: Carol Ada, MD    DISCHARGING PROVIDER: Carol Ada, MD      Admitting Note:  81 y.o. female with pmhx of HTN, HLD, depression, delusions, hx of CVA, asthma, OSA with home cpap, DM 2 with gastroparesis, neuropathy and LLL chronic venous ulcers, CKD III who presents with shortness of breath and wheezing. Patient states she was feeling fine and woke up early this morning feeling as though she could not breathe.  Work up in ED showed wbc 10.8k, Hg 9.2, plt 300, NA 142, K 3.9, BUN 22, Cr 0.86, glucose 49 with repeat 190 after d50% admiin, troponin 12, cxr showed cardiomegaly and mild interstital pulmonary edema. Covid and flu pending. Patient given duoneb treatment x 2 and iv steroids.  Patient placed on cpap for sleeping (patient states she has not been wearing for several days) but nursing denies any episode of hypoxia. Admitted to hospital for asthma exacerbation.     Vitals:    12/17/22 0834   BP: (!) 152/64   Pulse: 87   Resp: 20   Temp: 99 F (37.2 C)   SpO2: 96%        CONSULTATIONS: IP CONSULT TO PHYSICAL THERAPY  IP CONSULT TO CASE MANAGEMENT  IP CONSULT HOME HEALTH    PROCEDURES/SURGERIES: * No surgery found *    ADMITTING DIAGNOSES & HOSPITAL COURSE:   Acute asthma exacerbation: Patient was admitted and studies noted above she was noted to have a bronchospastic flare.  It was treated with inhaled bronchodilators and steroids via jet neb and p.o. steroids.  Initially on oxygen which was weaned bronchospasm is improved she has been discharged home amatory in stable satisfactory condition.  Remainder of the medical problems listed are stable.          DISCHARGE DIAGNOSES / PLAN:      Principal Problem:    Asthma due to seasonal allergies  Active Problems:     Dyslipidemia    Essential (primary) hypertension    Delusion (HCC)    Dementia without behavioral disturbance (HCC)    Obstructive sleep apnea    Vascular parkinsonism (HCC)    Type 2 diabetes mellitus with diabetic neuropathy    Chronic renal disease, stage III (HCC) [301279]    Ulcer of left lower extremity with muscle involvement without evidence of necrosis (HCC)    Acute respiratory failure (HCC)  Resolved Problems:    * No resolved hospital problems. *         FOLLOW UP APPOINTMENTS:    Jake Samples MD 3/5 days      ADDITIONAL CARE RECOMMENDATIONS: home health    DIET: diabetic diet  Oral Nutritional Supplements:     ACTIVITY: activity as tolerated    DISCHARGE MEDICATIONS:     Medication List        START taking these medications      * albuterol sulfate HFA 108 (90 Base) MCG/ACT inhaler  Commonly known as: Ventolin HFA  Inhale 2 puffs into the lungs 4 times daily as needed for Wheezing     * albuterol (2.5 MG/3ML) 0.083% nebulizer solution  Commonly known as: PROVENTIL  Take 3 mLs by nebulization 4 times  daily     * albuterol sulfate HFA 108 (90 Base) MCG/ACT inhaler  Commonly known as: Ventolin HFA  Inhale 2 puffs into the lungs 4 times daily as needed for Wheezing     arformoterol tartrate 15 MCG/2ML Nebu  Commonly known as: BROVANA  Take 1 ampule by nebulization 2 times daily     arformoterol tartrate 15 MCG/2ML NEBU 15 mcg, budesonide 0.25 MG/2ML SUSP 250 mcg  Take 1 Dose by nebulization in the morning and 1 Dose in the evening.     budesonide 0.25 MG/2ML nebulizer suspension  Commonly known as: Pulmicort  Take 2 mLs by nebulization 2 times daily     predniSONE 20 MG tablet  Commonly known as: DELTASONE  Take 2 tablets by mouth daily for 5 doses     QUEtiapine 25 MG tablet  Commonly known as: SEROQUEL  Take 0.5 tablets by mouth nightly           * This list has 3 medication(s) that are the same as other medications prescribed for you. Read the directions carefully, and ask your doctor or other care  provider to review them with you.                CONTINUE taking these medications      aspirin 81 MG chewable tablet  Take 1 tablet by mouth daily     carvedilol 12.5 MG tablet  Commonly known as: COREG     insulin lispro (1 Unit Dial) 100 UNIT/ML Sopn  Commonly known as: HUMALOG/ADMELOG     ipratropium 17 MCG/ACT inhaler  Commonly known as: ATROVENT HFA     linaclotide 290 MCG Caps capsule  Commonly known as: LINZESS     olopatadine 0.1 % ophthalmic solution  Commonly known as: PATANOL     omeprazole 40 MG delayed release capsule  Commonly known as: PRILOSEC  Take 1 capsule by mouth daily     quinapril 40 MG tablet  Commonly known as: ACCUPRIL  quinapril 40 mg tablet   TAKE 1 TABLET BY MOUTH EVERY NIGHT FOR HIGH BLOOD PRESSURE     Repatha SureClick 350 MG/ML Soaj  Generic drug: Evolocumab  INJECT AS DIRECTED EVERY 2 WEEKS     SITagliptin 100 MG tablet  Commonly known as: JANUVIA  Take 1 tablet by mouth daily     therapeutic multivitamin-minerals tablet     vitamin D3 125 MCG (5000 UT) Tabs tablet  Commonly known as: CHOLECALCIFEROL  Take 1 tablet by mouth daily            STOP taking these medications      amLODIPine 5 MG tablet  Commonly known as: NORVASC     coenzyme Q10 100 MG Caps capsule     estradiol 1 MG tablet  Commonly known as: ESTRACE     metoprolol tartrate 50 MG tablet  Commonly known as: LOPRESSOR     olmesartan 20 MG tablet  Commonly known as: BENICAR     pregabalin 300 MG capsule  Commonly known as: LYRICA               Where to Get Your Medications        These medications were sent to [facility] [place] [phone]  [address], [place] [address]      Phone: [phone]   albuterol (2.5 MG/3ML) 0.083% nebulizer solution  albuterol sulfate HFA 108 (90 Base)  MCG/ACT inhaler  arformoterol tartrate 15 MCG/2ML Nebu  budesonide 0.25 MG/2ML nebulizer suspension  predniSONE 20 MG tablet  QUEtiapine 25 MG tablet       These medications  were sent to [id number] [place] - [address] - P [phone] Carmon Ginsberg [phone]  [address], [place] [address]      Phone: [phone]   albuterol sulfate HFA 108 (90 Base) MCG/ACT inhaler       Information about where to get these medications is not yet available    Ask your nurse or doctor about these medications  arformoterol tartrate 15 MCG/2ML NEBU 15 mcg, budesonide 0.25 MG/2ML SUSP 250 mcg           DISPOSITION:  Home or Self Care     PATIENT CONDITION AT DISCHARGE:     stable    I personally saw the patient today and spent less than or equal to 30 minutes  on counseling and coordination of care in discharging this patient.      Please note that portions of this dictation may have been recorded with voice recognition software. Some unanticipated grammatical, syntax, homophones, and other interpretive errors are inadvertently transcribed by the computer software. An attempt at proof reading has been made to minimize errors and omissions.  Please disregard these errors.  Thank you.      Signed:   Carol Ada, MD  12/17/2022  10:22 AM

## 2022-12-17 NOTE — Plan of Care (Signed)
Patient being discharged home with home health. Peripheral IV removed prior to discharge,tip intact, minimal blood loss. Patient is alert and orientedX4, on room air and ambulates with a walker at home. She is being transported by [facility] wheelchair transport scheduled for 3pm.      Problem: Safety - Adult  Goal: Free from fall injury  Outcome: Adequate for Discharge     Problem: Pain  Goal: Verbalizes/displays adequate comfort level or baseline comfort level  Outcome: Adequate for Discharge     Problem: Skin/Tissue Integrity  Goal: Absence of new skin breakdown  Description: 1.  Monitor for areas of redness and/or skin breakdown  2.  Assess vascular access sites hourly  3.  Every 4-6 hours minimum:  Change oxygen saturation probe site  4.  Every 4-6 hours:  If on nasal continuous positive airway pressure, respiratory therapy assess nares and determine need for appliance change or resting period.  Outcome: Adequate for Discharge     Problem: ABCDS Injury Assessment  Goal: Absence of physical injury  Outcome: Adequate for Discharge     Problem: Chronic Conditions and Co-morbidities  Goal: Patient's chronic conditions and co-morbidity symptoms are monitored and maintained or improved  Outcome: Adequate for Discharge

## 2022-12-17 NOTE — Progress Notes (Signed)
Hospital Progress Note  Cristine Polio, MD   Answering service: [phone] OR    PerfectServe      NAME:  Jamie Bryant   DOB:   03-19-1941   MRN:  [id number]     Date/Time:  12/17/2022 7:30 AM    Plan:     Home today  Consult CM  Encourage activity  PT/OT  Risk of Deterioration: Low  []            Moderate  [x]            High  []                  Assessment:     Principal Problem:    Asthma due to seasonal allergies     Jet neb     Steroids    Active Problems:    Dyslipidemia     Repatha      Essential (primary) hypertension     Titrate home meds      Delusion (HCC)     Seroquel at hs      Dementia without behavioral disturbance (HCC)     Stable currently      Obstructive sleep apnea     Home CPAP      Vascular parkinsonism (HCC)    Type 2 diabetes mellitus with diabetic neuropathy     Correctinal scale      Chronic renal disease, stage III (HCC) [301279]     Stable      Ulcer of left lower extremity with muscle involvement without evidence of necrosis (HCC)     Wound care    Acute respiratory failure (HCC)    Wean oxygen  Resolved Problems:    * No resolved hospital problems. *        Admitting notes:81 y.o. female with pmhx of HTN, HLD, depression, delusions, hx of CVA, asthma, OSA with home cpap, DM 2 with gastroparesis, neuropathy and LLL chronic venous ulcers, CKD III who presents with shortness of breath and wheezing. Patient states she was feeling fine and woke up early this morning feeling as though she could not breathe.  Work up in ED showed wbc 10.8k, Hg 9.2, plt 300, NA 142, K 3.9, BUN 22, Cr 0.86, glucose 49 with repeat 190 after d50% admiin, troponin 12, cxr showed cardiomegaly and mild interstital pulmonary edema. Covid and flu pending. Patient given duoneb treatment x 2 and iv steroids.  Patient placed on cpap for sleeping (patient states she has not been wearing for several days) but nursing denies any episode of hypoxia. Admitted to hospital for asthma exacerbation.         Subjective:      Feeling better - c/o wheezing with activity  11 Point Review of Systems:   Negative except no cp    []             Unable to obtain ROS due to:       []             mental status change []             sedated []             intubated     Social History     Tobacco Use    Smoking status: Never     Passive exposure: Never    Smokeless tobacco: Never   Substance Use Topics    Alcohol use: No     Medications reviewed:  Current  Facility-Administered Medications   Medication Dose Route Frequency    albuterol (PROVENTIL) (2.5 MG/3ML) 0.083% nebulizer solution 2.5 mg  2.5 mg Nebulization 4x Daily RT    predniSONE (DELTASONE) tablet 60 mg  60 mg Oral Daily    glucose chewable tablet 16 g  4 tablet Oral PRN    dextrose bolus 10% 125 mL  125 mL IntraVENous PRN    Or    dextrose bolus 10% 250 mL  250 mL IntraVENous PRN    glucagon (rDNA) injection 1 mg  1 mg SubCUTAneous PRN    dextrose 10 % infusion   IntraVENous Continuous PRN    enoxaparin Sodium (LOVENOX) injection 30 mg  30 mg SubCUTAneous BID    sodium chloride flush 0.9 % injection 5-40 mL  5-40 mL IntraVENous 2 times per day    sodium chloride flush 0.9 % injection 5-40 mL  5-40 mL IntraVENous PRN    0.9 % sodium chloride infusion   IntraVENous PRN    ondansetron (ZOFRAN-ODT) disintegrating tablet 4 mg  4 mg Oral Q8H PRN    Or    ondansetron (ZOFRAN) injection 4 mg  4 mg IntraVENous Q6H PRN    polyethylene glycol (GLYCOLAX) packet 17 g  17 g Oral Daily PRN    acetaminophen (TYLENOL) tablet 650 mg  650 mg Oral Q6H PRN    Or    acetaminophen (TYLENOL) suppository 650 mg  650 mg Rectal Q6H PRN    arformoterol 15 mcg-budesonide 0.25 mg neb solution   Nebulization BID RT    aspirin chewable tablet 81 mg  81 mg Oral Daily    linaclotide (LINZESS) capsule 290 mcg  290 mcg Oral QAM AC    ketotifen fumarate (ZADITOR) 0.035 % ophthalmic solution 1 drop  1 drop Both Eyes BID    pantoprazole (PROTONIX) tablet 40 mg  40 mg Oral QAM AC    pregabalin (LYRICA) capsule 300 mg  300 mg  Oral BID    lisinopril (PRINIVIL;ZESTRIL) tablet 20 mg  20 mg Oral Daily    alogliptin (NESINA) tablet 25 mg  25 mg Oral Daily    Vitamin D (CHOLECALCIFEROL) tablet 5,000 Units  5,000 Units Oral Daily    insulin lispro (HUMALOG) injection vial 0-4 Units  0-4 Units SubCUTAneous TID WC    insulin lispro (HUMALOG) injection vial 0-4 Units  0-4 Units SubCUTAneous Nightly    QUEtiapine (SEROQUEL) tablet 12.5 mg  12.5 mg Oral Nightly    ipratropium 0.5 mg-albuterol 2.5 mg (DUONEB) nebulizer solution 1 Dose  1 Dose Inhalation Q4H PRN        Objective:   Vitals:  BP (!) 135/59   Pulse 78   Temp 98.4 F (36.9 C) (Oral)   Resp 18   Ht 1.626 m (5\' 4" )   Wt 108.4 kg (239 lb)   SpO2 96%   BMI 41.02 kg/m   Temp (24hrs), Avg:98.1 F (36.7 C), Min:97.9 F (36.6 C), Max:98.4 F (36.9 C)           Last 24hr Input/Output:    Intake/Output Summary (Last 24 hours) at 12/17/2022 0730  Last data filed at 12/16/2022 2019  Gross per 24 hour   Intake --   Output 2100 ml   Net -2100 ml          PHYSICAL EXAM:  General:    Alert, cooperative, no distress, appears stated age.     Head:   Normocephalic, without obvious abnormality, atraumatic.  Eyes:   Conjunctivae/corneas  clear.  PERRLA  Nose:  Nares normal. No drainage or sinus tenderness.  Throat:    Lips, mucosa, and tongue normal.  No Thrush  Neck:  Supple, symmetrical,  no adenopathy, thyroid: non tender    no carotid bruit and no JVD.  Back:    Symmetric,  No CVA tenderness.  Lungs:   Decrease bs with wheezing  Chest wall:  No tenderness or deformity. No Accessory muscle use.  Heart:   Regular rate and rhythm,  no murmur, rub or gallop.  Abdomen:   Soft, non-tender. Not distended.  Bowel sounds normal. No masses  Extremities: Extremities normal, atraumatic, No cyanosis.  No edema. No clubbing  Skin:     Texture, turgor normal. No rashes or lesions.  Not Jaundiced  Lymph nodes: Cervical, supraclavicular normal.  Psych:  Good insight.  Not depressed.  Not anxious or  agitated.  Neurologic: Normal strength, Alert and oriented X 3.       Lab Data Reviewed:    Recent Labs     12/15/22  0506   WBC 10.8   HGB 9.2*   HCT 30.2*   PLT 300       Recent Labs     12/15/22  0506 12/17/22  0243   NA 142 142   K 3.9 4.4   CL 110* 108   CO2 31 29   BUN 22* 21*   ALT 19  --        No results found for: "GLUCPOC"  No results for input(s): "PH", "PCO2", "PO2", "HCO3", "FIO2" in the last 72 hours.  No results for input(s): "INR" in the last 72 hours.  ___________________________________________________  ___________________________________________________    Attending Physician: Esperanza Richters, MD

## 2022-12-18 NOTE — Care Coordination-Inpatient (Signed)
Care Transitions Initial Follow Up Call    Call within 2 business days of discharge: Yes    Patient Current Location:  [place] Transition Nurse contacted the patient by telephone to perform post hospital discharge assessment. Verified name and DOB with patient as identifiers. Provided introduction to self, and explanation of the Care Transition Nurse role.     Patient: Jamie Bryant Patient DOB: [birth year]   MRN: [id number]  Reason for Admission: sob,wheezing  Discharge Date: 12/17/22 RARS: Readmission Risk Score: 17.7      Last Discharge Facility       Date Complaint Diagnosis Description Type Department Provider    12/15/22 Shortness of Breath Moderate asthma with exacerbation, unspecified whether persistent ... ED to Hosp-Admission (Discharged) (ADMITTED) [id number], Mckinley Jewel, MD; Terrilyn Saver, ...            Was this an external facility discharge? No Discharge Facility: [facility] 12/18-12/20    Challenges to be reviewed by the provider   Additional needs identified to be addressed with provider: Yes  medications-On Discharge instructions, advised to stop Norvasc- patient will check with her cardio, Dr.Erwin, about whether she should stop it or not.  New rxs that were sent to [facility]- Brovana neb sol and Budesonid neb sol both require PA. Pharmacy sent message to discharging doctor.  CTN notified pcp.              Method of communication with provider: chart routing.    Called and spoke to patient. Says she feels good. Less sob, does have some sob when active, and less wheezing. No fever. Uses walker to ambulate. Lives alone, has a cousin nearby that is supportive. Sees cardio, Dr.Michael Mariane Masters- monitors VS bid and results go to his office. [place] to see her for evaluation/support- scheduled for 12/22 in am before appt with pcp.     Care Transition Nurse reviewed discharge instructions, medical action plan, and red flags with patient who verbalized understanding. The patient was given an  opportunity to ask questions and does not have any further questions or concerns at this time. Were discharge instructions available to patient? Yes. Reviewed appropriate site of care based on symptoms and resources available to patient including: PCP  Specialist  Urgent care clinics  Home health  When to call [facility]. The patient agrees to contact the PCP office for questions related to their healthcare.     Advance Care Planning:   Does patient have an Advance Directive: not on file and patient declined education.    Medication reconciliation was performed with patient, who verbalizes understanding of administration of home medications. Medications reviewed, 1111F entered: yes  . Patient has difficulty reading small writing with directions on pill bottles- suggested she use magnifying glass for more clarity. Does not like to put meds in pill box- prefers to take out of bottles so knows what she is taking.     Was patient discharged with a pulse oximeter? no    Non-face-to-face services provided:  Scheduled appointment with PCP-Dr.Bonner 12/19/22  Obtained and reviewed discharge summary and/or continuity of care documents    Offered patient enrollment in the Remote Patient Monitoring (RPM) program for in-home monitoring:  enrolled in RPM per her cardio office- Dr.Michael Mariane Masters .    Care Transitions 24 Hour Call    Do you have a copy of your discharge instructions?: Yes  Do you have all of your prescriptions and are they filled?: No  Have you been contacted by a [facility] Pharmacist?: No  Have you scheduled your follow up appointment?: Yes  How are you going to get to your appointment?: Car - family or friend to transport  Do you have support at home?: Alone  Do you feel like you have everything you need to keep you well at home?: Yes  Are you an active caregiver in your home?: No  Care Transitions Interventions         Discussed follow-up appointments. If no appointment was previously scheduled,  appointment scheduling offered: Yes.   Is follow up appointment scheduled within 7 days of discharge? Yes.    Follow Up  Future Appointments   Date Time Provider Department Center   12/19/2022 12:15 PM Vianne Bulls, MD [facility] MAIN BS AMB       Care Transition Nurse provided contact information.  Plan for follow-up call in 5-7 days based on severity of symptoms and risk factors.  Plan for next call: symptom management-assess current symptoms  self management-following discharge instructions  medication management-taking meds as ordered.    Rosanne Ashing, RN

## 2022-12-19 ENCOUNTER — Ambulatory Visit: Admit: 2022-12-19 | Discharge: 2022-12-19 | Payer: MEDICARE | Attending: Internal Medicine | Primary: Internal Medicine

## 2022-12-19 DIAGNOSIS — L97925 Non-pressure chronic ulcer of unspecified part of left lower leg with muscle involvement without evidence of necrosis: Secondary | ICD-10-CM

## 2022-12-19 NOTE — Progress Notes (Signed)
Chief Complaint   Patient presents with    Follow-up     "Have you been to the ER, urgent care clinic since your last visit?  Hospitalized since your last visit?"    YES - When: approximately 1  weeks ago.  Where and Why: [place] [facility].    "Have you seen or consulted any other health care providers outside of [facility] since your last visit?"    NO

## 2022-12-23 NOTE — Progress Notes (Signed)
[facility]  [address]  [address]  [place] [address]  Phone:  [phone]  Fax: [phone]       Chief Complaint   Patient presents with    Follow-up   .      SUBJECTIVE:    Jamie Bryant is a 81 y.o. female  Dictation on: 12/23/2022  1:40 PM by: Vianne Bulls [phone]          Current Outpatient Medications   Medication Sig Dispense Refill    albuterol (PROVENTIL) (2.5 MG/3ML) 0.083% nebulizer solution Take 3 mLs by nebulization 4 times daily 120 each 3    QUEtiapine (SEROQUEL) 25 MG tablet Take 0.5 tablets by mouth nightly 60 tablet 3    albuterol sulfate HFA (VENTOLIN HFA) 108 (90 Base) MCG/ACT inhaler Inhale 2 puffs into the lungs 4 times daily as needed for Wheezing 18 g 0    carvedilol (COREG) 12.5 MG tablet Take 1 tablet by mouth 2 times daily      Multiple Vitamins-Minerals (THERAPEUTIC MULTIVITAMIN-MINERALS) tablet Take 1 tablet by mouth daily      omeprazole (PRILOSEC) 40 MG delayed release capsule Take 1 capsule by mouth daily 90 capsule 3    aspirin 81 MG chewable tablet Take 1 tablet by mouth daily 30 tablet 11    vitamin D3 (CHOLECALCIFEROL) 125 MCG (5000 UT) TABS tablet Take 1 tablet by mouth daily 30 tablet 11    SITagliptin (JANUVIA) 100 MG tablet Take 1 tablet by mouth daily 90 tablet 3    REPATHA SURECLICK 140 MG/ML SOAJ INJECT AS DIRECTED EVERY 2 WEEKS 2 Adjustable Dose Pre-filled Pen Syringe 11    insulin lispro, 1 Unit Dial, (HUMALOG/ADMELOG) 100 UNIT/ML SOPN Humalog KwikPen (U-100) Insulin 100 unit/mL subcutaneous   INJECT UP TO 50 UNITS UNDER THE SKIN DAILY AS DIRECTED      ipratropium (ATROVENT HFA) 17 MCG/ACT inhaler Atrovent HFA 17 mcg/actuation aerosol inhaler      linaclotide (LINZESS) 290 MCG CAPS capsule Take 1 capsule by mouth as needed      olopatadine (PATANOL) 0.1 % ophthalmic solution Apply 2 drops to eye 2 times daily      arformoterol tartrate 15 MCG/2ML NEBU 15 mcg, budesonide 0.25 MG/2ML SUSP 250 mcg Take 1 Dose by nebulization in  the morning and 1 Dose in the evening. (Patient not taking: Reported on 12/18/2022) 60 Units 11    albuterol sulfate HFA (VENTOLIN HFA) 108 (90 Base) MCG/ACT inhaler Inhale 2 puffs into the lungs 4 times daily as needed for Wheezing (Patient not taking: Reported on 12/18/2022) 54 g 1    arformoterol tartrate (BROVANA) 15 MCG/2ML NEBU Take 1 ampule by nebulization 2 times daily (Patient not taking: Reported on 12/18/2022) 120 mL 3    budesonide (PULMICORT) 0.25 MG/2ML nebulizer suspension Take 2 mLs by nebulization 2 times daily (Patient not taking: Reported on 12/18/2022) 60 each 3    quinapril (ACCUPRIL) 40 MG tablet quinapril 40 mg tablet   TAKE 1 TABLET BY MOUTH EVERY NIGHT FOR HIGH BLOOD PRESSURE (Patient not taking: Reported on 12/19/2022) 90 tablet 3     No current facility-administered medications for this visit.     Past Medical History:   Diagnosis Date    Arthritis     Asthma     Chronic renal disease, stage III [facility]) [[id number]] 10/13/2022    Depression     Diabetes (HCC)     Fibromyalgia  Fibromyalgia     Gastroparesis     Hyperlipemia     Hypertension     Neuropathy     Psychotic disorder (HCC)     Stroke [facility])      Past Surgical History:   Procedure Laterality Date    COLONOSCOPY      HYSTERECTOMY (CERVIX STATUS UNKNOWN)       Allergies   Allergen Reactions    Statins Myalgia     Lovastatin, pravastatin, simvastatin, atorvastatin, zetia, livalo, lovaza    Ampicillin Rash    Lidocaine Rash         REVIEW OF SYSTEMS:  General: negative for - chills or fever  ENT: negative for - headaches, nasal congestion or tinnitus  Respiratory: negative for - cough, hemoptysis, shortness of breath or wheezing  Cardiovascular : negative for - chest pain, edema, palpitations or shortness of breath  Gastrointestinal: negative for - abdominal pain, blood in stools, heartburn or nausea/vomiting  Genito-Urinary: no dysuria, trouble voiding, or hematuria  Musculoskeletal: negative for - gait disturbance, joint pain, joint  stiffness or joint swelling  Neurological: no TIA or stroke symptoms  Hematologic: no bruises, no bleeding, no swollen glands  Integument: no lumps, mole changes, nail changes or rash  Endocrine: no malaise/lethargy or unexpected weight changes      Social History     Socioeconomic History    Marital status: Divorced     Spouse name: None    Number of children: 2    Years of education: None    Highest education level: Bachelor's degree (e.g., BA, AB, BS)   Tobacco Use    Smoking status: Never     Passive exposure: Never    Smokeless tobacco: Never   Vaping Use    Vaping Use: Never used   Substance and Sexual Activity    Alcohol use: No    Drug use: No    Sexual activity: Not Currently     Comment: divorced,2 children,retired,living in [facility]   Social History Narrative    Nothing in the past 5 years     Social Determinants of Health     Financial Resource Strain: Low Risk  (12/19/2022)    Overall Financial Resource Strain (CARDIA)     Difficulty of Paying Living Expenses: Not very hard   Food Insecurity: Food Insecurity Present (12/19/2022)    Hunger Vital Sign     Worried About Running Out of Food in the Last Year: Sometimes true     Ran Out of Food in the Last Year: Sometimes true   Transportation Needs: No Transportation Needs (12/19/2022)    PRAPARE - Therapist, art (Medical): No     Lack of Transportation (Non-Medical): No   Physical Activity: Inactive (08/06/2022)    Exercise Vital Sign     Days of Exercise per Week: 0 days     Minutes of Exercise per Session: 0 min   Stress: Stress Concern Present (08/06/2022)    [employer] of Occupational Health - Occupational Stress Questionnaire     Feeling of Stress : Very much   Social Connections: Moderately Isolated (08/06/2022)    Social Connection and Isolation Panel [NHANES]     Frequency of Communication with Friends and Family: More than three times a week     Frequency of Social Gatherings with Friends and Family:  Never     Attends Religious Services: Never     Production manager of [employer]  or Organizations: Yes     Attends Banker Meetings: Never     Marital Status: Divorced   Catering manager Violence: Not At Risk (11/03/2022)    Humiliation, Afraid, Rape, and Kick questionnaire     Fear of Current or Ex-Partner: No     Emotionally Abused: No     Physically Abused: No     Sexually Abused: No   Housing Stability: Low Risk  (12/19/2022)    Housing Stability Vital Sign     Unable to Pay for Housing in the Last Year: No     Number of Places Lived in the Last Year: 1     Unstable Housing in the Last Year: No     Family History   Problem Relation Age of Onset    Breast Cancer Maternal Grandmother     Cancer Father         lung ca    Hypertension Father     Diabetes Mother     Hypertension Mother        OBJECTIVE:    BP 130/69 (Site: Left Upper Arm, Position: Sitting, Cuff Size: Large Adult)   Pulse 79   Temp 98.4 F (36.9 C) (Oral)   Resp 18   Ht 1.626 m (5\' 4" )   Wt 104.1 kg (229 lb 8 oz)   SpO2 97%   BMI 39.39 kg/m   CONSTITUTIONAL: well , well nourished, appears age appropriate  EYES: perrla, eom intact  ENMT:moist mucous membranes, pharynx clear  NECK: supple. Thyroid normal  RESPIRATORY: Chest: clear to ascultation and percussion   CARDIOVASCULAR: Heart: regular rate and rhythm  GASTROINTESTINAL: Abdomen: soft, bowel sounds active  HEMATOLOGIC: no pathological lymph nodes palpated  MUSCULOSKELETAL: Extremities: no edema, pulse 1+   INTEGUMENT: No unusual rashes or suspicious skin lesions noted. Nails appear normal.  NEUROLOGIC: non-focal exam   MENTAL STATUS: alert and oriented, appropriate affect      ASSESSMENT:  1. Ulcer of left lower extremity with muscle involvement without evidence of necrosis (HCC)    2. Type 2 diabetes mellitus with diabetic neuropathy, without long-term current use of insulin (HCC)    3. Venous insufficiency    4. Severe obesity (BMI 35.0-39.9) with comorbidity [facility])        PLAN:    Dictation on: 12/23/2022  1:42 PM by: Vianne Bulls [phone]        Follow-up and Dispositions    Return in about 4 weeks (around 01/16/2023).             Arbutus Ped, MD

## 2022-12-25 NOTE — Telephone Encounter (Signed)
I DONT  see the medication on her list but she said its 100  humalog uml   She says its 90 day supply. She would like to go to [place]
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
## 2023-01-12 NOTE — Care Coordination-Inpatient (Signed)
Care Transitions Follow Up Call    Patient Current Location:  [facility] Transition Nurse contacted the patient by telephone to follow up after admission on 12/15/22.  Verified name and DOB with patient as identifiers.    Patient: Jamie Bryant  Patient DOB: 1941/05/05   MRN: [id number]  Reason for Admission: sob, wheezing  Discharge Date: 12/17/22 RARS: Readmission Risk Score: 17.7      Needs to be reviewed by the provider   Additional needs identified to be addressed with provider: Yes  Continues with pain in leg and edema- sob with activity.  Home Health nurse saw her Friday and continues to see her. Advised elevating legs.  F/u pcp, Dr.Bonner 1/31.  Advised contact pcp if symptoms worsen.         Method of communication with provider: chart routing.    Called and spoke to patient. Says leg pain bothering her, home health nurse has recommended elevating legs/feet to decrease edema. SOB with activity. Eating and drinking . Sees pcp again on 1/31. Taking meds as ordered she states. Cousin continues to check in on her.     Addressed changes since last contact:  none  Discussed follow-up appointments. If no appointment was previously scheduled, appointment scheduling offered: Yes.   Is follow up appointment scheduled within 7 days of discharge? Yes.    Follow Up  Future Appointments   Date Time Provider [place]   01/28/2023 12:30 PM Noni Saupe, MD [facility] MAIN BS AMB     External follow up appointment(s): na    Care Transition Nurse reviewed red flags with patient and discussed any barriers to care and/or understanding of plan of care after discharge. Discussed appropriate site of care based on symptoms and resources available to patient including: PCP  Specialist  Home health  When to call [place]. The patient agrees to contact the PCP office for questions related to their healthcare.     Advance Care Planning:   not on file and patient declined education.     Patients top risk factors  for readmission: functional cognitive ability, medical condition-asthma exacerbation, CVA,delusions, HTN , and support system  Interventions to address risk factors: Education of patient/family/caregiver/guardian to support self-management-meds as ordered, work with home health nurse and PT, f/u with providers.    Offered patient enrollment in the Remote Patient Monitoring (RPM) program for in-home monitoring: Patient declined.     Care Transitions Subsequent and Final Call    Subsequent and Final Calls  Do you have any ongoing symptoms?: Yes  Onset of Patient-reported symptoms: Other  Patient-reported symptoms: Pain  Interventions for patient-reported symptoms: Notified Home Care  Have your medications changed?: No  Do you have any questions related to your medications?: No  Do you currently have any active services?: Yes  Are you currently active with any services?: Home Health  Do you have any needs or concerns that I can assist you with?: No  Identified Barriers: Impairment  Care Transitions Interventions  Other Interventions:            Goals Addressed                   This Visit's Progress     Medication Management   No change     I will take my medication as directed.  I will notify my provider of any problems with medications, like adverse effects or side effects.  I will notify my provider  for advice before I stop taking any of my medication.    Barriers: impairment:  visual and lack of support  Plan for overcoming my barriers: Home Health nurse to review meds with patient in person, encourage patient to call cardio if concerns re bp meds, get magnifying glass to read rx labels-ask pharmacy to print larger labels for her  Confidence: 7/10  Anticipated Goal Completion Date: 01/19/23  01/01/23  Says sob seems better, only occasional wheezing. Taking meds.mbt  01/12/23  Reports not great today, still has some pain in leg. Nurse from [facility] has told her to stay off leg to decrease the edema. Sees pcp again 1/31. SOB  only when up and active.mbt               Care Transition Nurse provided contact information for future needs. Plan for follow-up call in 5-7 days based on severity of symptoms and risk factors.  Plan for next call: symptom management-assess current symptoms  self management-following discharge instructions.    Gretel Acre, RN

## 2023-01-20 NOTE — Care Coordination-Inpatient (Signed)
Called and LM 1/22 and 1/23 for patient.  Called son (on HIPPA),and LM as well.  Also called daughter(on HIPPA) and LM.  Patient is high risk for readmission due to COPD. Would like to refer her to our ACM team.  Message to [facility] re patient need for continued support.  Closed program at this time as unable to contact.

## 2023-01-28 ENCOUNTER — Ambulatory Visit: Admit: 2023-01-28 | Discharge: 2023-01-28 | Payer: MEDICARE | Attending: Internal Medicine | Primary: Internal Medicine

## 2023-01-28 DIAGNOSIS — L97929 Non-pressure chronic ulcer of unspecified part of left lower leg with unspecified severity: Secondary | ICD-10-CM

## 2023-01-28 NOTE — Care Coordination-Inpatient (Signed)
Ambulatory Care Coordination Note     01/28/2023 3:31 PM     Patient Current Location:  [place]  This patient was received as a referral from [place] .     patient  outreach attempt by this ACM today to offer care management services. ACM was unable to reach the patient  by telephone today; left voice message requesting a return phone call to this ACM.     ACM: Estrella Deeds, RN

## 2023-01-28 NOTE — Progress Notes (Signed)
Chief Complaint   Patient presents with    Follow-up    Hypertension    Leg Pain     Left leg is swollen, itching, very sore     "Have you been to the ER, urgent care clinic since your last visit?  Hospitalized since your last visit?"    NO    "Have you seen or consulted any other health care providers outside of [place] since your last visit?"    NO

## 2023-01-29 ENCOUNTER — Encounter

## 2023-01-29 NOTE — Telephone Encounter (Signed)
Pmp okay to fill lyrica

## 2023-01-29 NOTE — Care Coordination-Inpatient (Unsigned)
Ambulatory Care Coordination Note     01/29/2023 2:24 PM     Patient Current Location:  [place]  This patient was received as a referral from Care Transition Nurse.     patient  outreach attempt by this ACM today to offer care management services. ACM was unable to reach the patient  by telephone today; left voice message requesting a return phone call to this ACM.     ACM: Zollie Beckers, RN

## 2023-01-30 MED ORDER — PREGABALIN 150 MG PO CAPS
150 MG | ORAL_CAPSULE | Freq: Three times a day (TID) | ORAL | 2 refills | Status: DC
Start: 2023-01-30 — End: 2023-02-03

## 2023-02-01 LAB — CULTURE, BODY FLUID

## 2023-02-01 NOTE — Progress Notes (Signed)
[facility]  [address]  [address] [place] [address]  Phone:  [phone]  Fax: [phone]       Chief Complaint   Patient presents with    Follow-up    Hypertension    Leg Pain     Left leg is swollen, itching, very sore   .      SUBJECTIVE:    Jamie Bryant is a 82 y.o. female  Dictation on: 02/01/2023 10:38 PM by: Vianne Bulls [phone]          Current Outpatient Medications   Medication Sig Dispense Refill    ciprofloxacin (CIPRO) 250 MG tablet Take 1 tablet by mouth 2 times daily for 10 days 20 tablet 0    linezolid (ZYVOX) 600 MG tablet Take 1 tablet by mouth 2 times daily for 14 days 28 tablet 0    albuterol (PROVENTIL) (2.5 MG/3ML) 0.083% nebulizer solution Take 3 mLs by nebulization 4 times daily 120 each 3    QUEtiapine (SEROQUEL) 25 MG tablet Take 0.5 tablets by mouth nightly 60 tablet 3    albuterol sulfate HFA (VENTOLIN HFA) 108 (90 Base) MCG/ACT inhaler Inhale 2 puffs into the lungs 4 times daily as needed for Wheezing 18 g 0    carvedilol (COREG) 12.5 MG tablet Take 1 tablet by mouth 2 times daily      Multiple Vitamins-Minerals (THERAPEUTIC MULTIVITAMIN-MINERALS) tablet Take 1 tablet by mouth daily      omeprazole (PRILOSEC) 40 MG delayed release capsule Take 1 capsule by mouth daily 90 capsule 3    aspirin 81 MG chewable tablet Take 1 tablet by mouth daily 30 tablet 11    vitamin D3 (CHOLECALCIFEROL) 125 MCG (5000 UT) TABS tablet Take 1 tablet by mouth daily 30 tablet 11    SITagliptin (JANUVIA) 100 MG tablet Take 1 tablet by mouth daily 90 tablet 3    REPATHA SURECLICK 140 MG/ML SOAJ INJECT AS DIRECTED EVERY 2 WEEKS 2 Adjustable Dose Pre-filled Pen Syringe 11    insulin lispro, 1 Unit Dial, (HUMALOG/ADMELOG) 100 UNIT/ML SOPN Humalog KwikPen (U-100) Insulin 100 unit/mL subcutaneous   INJECT UP TO 50 UNITS UNDER THE SKIN DAILY AS DIRECTED      ipratropium (ATROVENT HFA) 17 MCG/ACT inhaler Atrovent HFA 17 mcg/actuation aerosol inhaler      linaclotide  (LINZESS) 290 MCG CAPS capsule Take 1 capsule by mouth as needed      olopatadine (PATANOL) 0.1 % ophthalmic solution Apply 2 drops to eye 2 times daily      pregabalin (LYRICA) 150 MG capsule Take 1 capsule by mouth 3 times daily for 270 days. Max Daily Amount: 450 mg 270 capsule 2    arformoterol tartrate 15 MCG/2ML NEBU 15 mcg, budesonide 0.25 MG/2ML SUSP 250 mcg Take 1 Dose by nebulization in the morning and 1 Dose in the evening. (Patient not taking: Reported on 12/18/2022) 60 Units 11    albuterol sulfate HFA (VENTOLIN HFA) 108 (90 Base) MCG/ACT inhaler Inhale 2 puffs into the lungs 4 times daily as needed for Wheezing (Patient not taking: Reported on 12/18/2022) 54 g 1    arformoterol tartrate (BROVANA) 15 MCG/2ML NEBU Take 1 ampule by nebulization 2 times daily (Patient not taking: Reported on 12/18/2022) 120 mL 3    budesonide (PULMICORT) 0.25 MG/2ML nebulizer suspension Take 2 mLs by nebulization 2 times daily (Patient not taking: Reported on 12/18/2022) 60 each 3    quinapril (ACCUPRIL) 40  MG tablet quinapril 40 mg tablet   TAKE 1 TABLET BY MOUTH EVERY NIGHT FOR HIGH BLOOD PRESSURE (Patient not taking: Reported on 12/19/2022) 90 tablet 3     No current facility-administered medications for this visit.     Past Medical History:   Diagnosis Date    Arthritis     Asthma     Chronic renal disease, stage III [facility]) [[id number]] 10/13/2022    Depression     Diabetes (HCC)     Fibromyalgia     Fibromyalgia     Gastroparesis     Hyperlipemia     Hypertension     Neuropathy     Psychotic disorder (HCC)     Stroke [facility])      Past Surgical History:   Procedure Laterality Date    COLONOSCOPY      HYSTERECTOMY (CERVIX STATUS UNKNOWN)       Allergies   Allergen Reactions    Statins Myalgia     Lovastatin, pravastatin, simvastatin, atorvastatin, zetia, livalo, lovaza    Adhesive Tape Rash    Ampicillin Rash    Lidocaine Rash         REVIEW OF SYSTEMS:  General: negative for - chills or fever  ENT: negative for - headaches,  nasal congestion or tinnitus  Respiratory: negative for - cough, hemoptysis, shortness of breath or wheezing  Cardiovascular : negative for - chest pain, edema, palpitations or shortness of breath  Gastrointestinal: negative for - abdominal pain, blood in stools, heartburn or nausea/vomiting  Genito-Urinary: no dysuria, trouble voiding, or hematuria  Musculoskeletal: negative for - gait disturbance, joint pain, joint stiffness or joint swelling  Neurological: no TIA or stroke symptoms  Hematologic: no bruises, no bleeding, no swollen glands  Integument: no lumps, mole changes, nail changes or rash  Endocrine: no malaise/lethargy or unexpected weight changes      Social History     Socioeconomic History    Marital status: Divorced     Spouse name: None    Number of children: 2    Years of education: None    Highest education level: Bachelor's degree (e.g., BA, AB, BS)   Tobacco Use    Smoking status: Never     Passive exposure: Never    Smokeless tobacco: Never   Vaping Use    Vaping Use: Never used   Substance and Sexual Activity    Alcohol use: No    Drug use: No    Sexual activity: Not Currently     Comment: divorced,2 children,retired,living in [facility]   Social History Narrative    Nothing in the past 5 years     Social Determinants of Health     Financial Resource Strain: High Risk (01/28/2023)    Overall Financial Resource Strain (CARDIA)     Difficulty of Paying Living Expenses: Very hard   Food Insecurity: Food Insecurity Present (01/28/2023)    Hunger Vital Sign     Worried About Running Out of Food in the Last Year: Often true     Ran Out of Food in the Last Year: Often true   Transportation Needs: Unmet Transportation Needs (01/28/2023)    PRAPARE - Transportation     Lack of Transportation (Medical): No     Lack of Transportation (Non-Medical): Yes   Physical Activity: Inactive (08/06/2022)    Exercise Vital Sign     Days of Exercise per Week: 0 days     Minutes of Exercise per Session:  0  min   Stress: Stress Concern Present (08/06/2022)    [employer] of Occupational Health - Occupational Stress Questionnaire     Feeling of Stress : Very much   Social Connections: Moderately Isolated (08/06/2022)    Social Connection and Isolation Panel [NHANES]     Frequency of Communication with Friends and Family: More than three times a week     Frequency of Social Gatherings with Friends and Family: Never     Attends Religious Services: Never     Database administrator or Organizations: Yes     Attends Banker Meetings: Never     Marital Status: Divorced   Catering manager Violence: Not At Risk (11/03/2022)    Humiliation, Afraid, Rape, and Kick questionnaire     Fear of Current or Ex-Partner: No     Emotionally Abused: No     Physically Abused: No     Sexually Abused: No   Housing Stability: High Risk (01/28/2023)    Housing Stability Vital Sign     Unable to Pay for Housing in the Last Year: No     Number of Places Lived in the Last Year: 1     Unstable Housing in the Last Year: Yes     Family History   Problem Relation Age of Onset    Breast Cancer Maternal Grandmother     Cancer Father         lung ca    Hypertension Father     Diabetes Mother     Hypertension Mother        OBJECTIVE:    BP (!) 149/69 (Site: Left Upper Arm, Position: Sitting, Cuff Size: Large Adult)   Pulse 80   Temp 98.5 F (36.9 C) (Oral)   Resp 18   Ht 1.626 m (5\' 4" )   Wt 99.3 kg (218 lb 14.4 oz)   SpO2 97%   BMI 37.57 kg/m   CONSTITUTIONAL: well , well nourished, appears age appropriate  EYES: perrla, eom intact  ENMT:moist mucous membranes, pharynx clear  NECK: supple. Thyroid normal  RESPIRATORY: Chest: clear to ascultation and percussion   CARDIOVASCULAR: Heart: regular rate and rhythm  GASTROINTESTINAL: Abdomen: soft, bowel sounds active  HEMATOLOGIC: no pathological lymph nodes palpated  MUSCULOSKELETAL: Extremities: no edema, pulse 1+   INTEGUMENT: No unusual rashes or suspicious skin lesions noted. Nails  appear normal.  Superficial ulcerations involving the distal left lower extremity  NEUROLOGIC: non-focal exam   MENTAL STATUS: alert and oriented, appropriate affect      ASSESSMENT:  1. Ulcer of left lower extremity, unspecified ulcer stage (HCC)    2. Venous insufficiency    3. Venous stasis dermatitis of left lower extremity    4. Type 2 diabetes mellitus without complication, without long-term current use of insulin (HCC)    5. Severe obesity (BMI 35.0-39.9) with comorbidity (HCC)    6. Dyslipidemia    7. Essential (primary) hypertension        PLAN:   Dictation on: 02/01/2023 10:40 PM by: Vianne Bulls [phone]     Orders Placed This Encounter   Procedures    Culture, Body Fluid     Standing Status:   Future     Number of Occurrences:   1     Standing Expiration Date:   01/29/2024     Scheduling Instructions:      L ieg uulcers        Follow-up and Dispositions  Return in about 3 weeks (around 02/18/2023).             Arbutus Ped, MD

## 2023-02-02 MED ORDER — LINEZOLID 600 MG PO TABS
600 MG | ORAL_TABLET | Freq: Two times a day (BID) | ORAL | 0 refills | Status: AC
Start: 2023-02-02 — End: 2023-02-15

## 2023-02-02 MED ORDER — CIPROFLOXACIN HCL 250 MG PO TABS
250 MG | ORAL_TABLET | Freq: Two times a day (BID) | ORAL | 0 refills | Status: AC
Start: 2023-02-02 — End: 2023-02-11

## 2023-02-02 NOTE — Telephone Encounter (Signed)
Patient notified and told of antibiotics sent to pharmacy and we want to make sure she did get appt at wound healing clinic for the [facility]th

## 2023-02-03 ENCOUNTER — Encounter

## 2023-02-03 NOTE — Care Coordination-Inpatient (Signed)
Ambulatory Care Coordination Note     02/03/2023 10:05 AM     Patient Current Location:  [place]  This patient was received as a referral from [place] .     patient  outreach attempt by this ACM today to offer care management services. ACM was unable to reach the patient  by telephone today; left voice message requesting a return phone call to this ACM. and letter mailed requesting patient to contact this ACM.      UTR letter mailed 02/03/2023    ACM: Estrella Deeds, RN

## 2023-02-05 MED ORDER — PREGABALIN 150 MG PO CAPS
150 MG | ORAL_CAPSULE | Freq: Three times a day (TID) | ORAL | 2 refills | Status: AC
Start: 2023-02-05 — End: 2023-11-02

## 2023-02-06 ENCOUNTER — Encounter

## 2023-02-06 NOTE — Telephone Encounter (Signed)
Pmp okay to send rx, patient states dose 300mg  bid

## 2023-02-07 MED ORDER — PREGABALIN 300 MG PO CAPS
300 MG | ORAL_CAPSULE | Freq: Two times a day (BID) | ORAL | 1 refills | Status: AC
Start: 2023-02-07 — End: 2023-08-05

## 2023-02-11 NOTE — Care Coordination-Inpatient (Signed)
Ambulatory Care Management Note        Date/Time:  02/11/2023 4:57 PM    This patient was received as a referral from  [place]   for case management services.  Multiple unsuccessful attempts have been made to contact patient.  Ambulatory Care Management get in touch letter mailed to the patients address on file.   No further outreach attempts are scheduled by ACM at this time.

## 2023-02-13 NOTE — Telephone Encounter (Signed)
Erasmo Downer, nurse for home health has concerns about the patients wounds healing. Pt has refused wound care on her leg since 02/06/23. Pt is going to a wound care facility and the home health nurse is concerned that pt will take bandages off right after the bandages have been put on and that the wounds won't heal. The nurse stated that the patient is having hallucinations as well. Please call home health nurse with any questions. Call back number for Erasmo Downer is [phone]  JW,lpn

## 2023-02-16 ENCOUNTER — Inpatient Hospital Stay: Admit: 2023-02-16 | Discharge: 2023-02-16 | Payer: MEDICARE | Attending: Radiation Oncology | Primary: Internal Medicine

## 2023-02-16 ENCOUNTER — Inpatient Hospital Stay: Payer: MEDICARE | Attending: Radiation Oncology | Primary: Internal Medicine

## 2023-02-16 DIAGNOSIS — L97222 Non-pressure chronic ulcer of left calf with fat layer exposed: Secondary | ICD-10-CM

## 2023-02-16 DIAGNOSIS — L97829 Non-pressure chronic ulcer of other part of left lower leg with unspecified severity: Secondary | ICD-10-CM

## 2023-02-16 NOTE — Other (Signed)
Multilayer Compression Wrap   (Not Unna) Below the Knee    NAME:  Jamie Bryant  DATE OF BIRTH:  1941/05/03  MEDICAL RECORD NUMBER:  [id number]  DATE:  February 16, 2023    Removed old dressing; wash with soap and water, Applied primary and secondary dressing as ordered, Place compression to left lower leg, Instructed patient/caregiver not to remove dressing and to keep it clean and dry, Instructed patient/caregiver on complications to report to provider, such as pain, numbness in toes, heavy drainage, and slippage of dressing, and Instructed patient/caregiver on purpose of compression dressing and on activity and exercise recommendations    Response to treatment: Well tolerated by patient      Electronically signed by Dewitt Rota, RN on 02/16/2023 at 11:47 AM

## 2023-02-16 NOTE — Consults (Incomplete)
[facility]   History and Physical Note   Referring Provider: Vianne Bulls, MD  Reason for Referral:  non healing left lower leg venous ulcers    Jamie Bryant  MEDICAL RECORD NUMBER:  [id number]  AGE: 82 y.o.   GENDER: female  DOB: Feb 03, 1941  EPISODE DATE:  02/16/2023    Chief complaint and reason for visit:     Chief Complaint   Patient presents with non healing left lower leg venous ulcers    Wound Check    Follow-up         HISTORY of PRESENT ILLNESS HPI     Jamie Bryant is a 82 y.o. female who presents today for an initial evaluation of a wound/ulcer. Patient is new to the wound center. Wound duration:  5 month(s).    History of Wound Context: Jamie Bryant,  a 82 yo female with history of DM 2; HTN, CVA and chronic venous insufficiency of the lower legs, is seen today at the [facility] at the request of [name] for evaluation of her left lower leg venous ulcers,  Patient states having had these wounds since September, 2023 but unclear of the cause.  Complains of copious drainage and pain, rating it a 15/10, constant burning and at times sharp. Takes Tylenol prn for pain but claims it does not help.  Denies any fever or chills              Pertinent associated symptoms: {Wound associated symptoms:34735}    PAST MEDICAL HISTORY        Diagnosis Date    Arthritis     Asthma     Chronic renal disease, stage III (HCC) [629528] 10/13/2022    Depression     Diabetes (HCC)     Fibromyalgia     Fibromyalgia     Gastroparesis     Hyperlipemia     Hypertension     Neuropathy     Psychotic disorder (HCC)     Stroke (HCC)        PAST SURGICAL HISTORY  Past Surgical History:   Procedure Laterality Date    COLONOSCOPY      HYSTERECTOMY (CERVIX STATUS UNKNOWN)         FAMILY HISTORY  Family History   Problem Relation Age of Onset    Breast Cancer Maternal Grandmother     Cancer Father         lung ca    Hypertension Father     Diabetes Mother     Hypertension Mother        SOCIAL  HISTORY  Social History     Tobacco Use    Smoking status: Never     Passive exposure: Never    Smokeless tobacco: Never   Vaping Use    Vaping Use: Never used   Substance Use Topics    Alcohol use: No    Drug use: No       ALLERGIES  Allergies   Allergen Reactions    Statins Myalgia     Lovastatin, pravastatin, simvastatin, atorvastatin, zetia, livalo, lovaza    Adhesive Tape Rash    Ampicillin Rash    Lidocaine Rash       MEDICATIONS  Current Outpatient Medications on File Prior to Encounter   Medication Sig Dispense Refill    linezolid (ZYVOX) 600 MG tablet Take 1 tablet by mouth 2 times daily Bid FOR 14 DAYS  pregabalin (LYRICA) 300 MG capsule Take 1 capsule by mouth 2 times daily for 180 days. Max Daily Amount: 600 mg 180 capsule 1    pregabalin (LYRICA) 150 MG capsule Take 1 capsule by mouth 3 times daily for 270 days. Max Daily Amount: 450 mg 270 capsule 2    arformoterol tartrate 15 MCG/2ML NEBU 15 mcg, budesonide 0.25 MG/2ML SUSP 250 mcg Take 1 Dose by nebulization in the morning and 1 Dose in the evening. (Patient not taking: Reported on 12/18/2022) 60 Units 11    albuterol sulfate HFA (VENTOLIN HFA) 108 (90 Base) MCG/ACT inhaler Inhale 2 puffs into the lungs 4 times daily as needed for Wheezing (Patient not taking: Reported on 12/18/2022) 54 g 1    albuterol (PROVENTIL) (2.5 MG/3ML) 0.083% nebulizer solution Take 3 mLs by nebulization 4 times daily 120 each 3    QUEtiapine (SEROQUEL) 25 MG tablet Take 0.5 tablets by mouth nightly (Patient not taking: Reported on 02/16/2023) 60 tablet 3    arformoterol tartrate (BROVANA) 15 MCG/2ML NEBU Take 1 ampule by nebulization 2 times daily (Patient not taking: Reported on 12/18/2022) 120 mL 3    budesonide (PULMICORT) 0.25 MG/2ML nebulizer suspension Take 2 mLs by nebulization 2 times daily (Patient not taking: Reported on 12/18/2022) 60 each 3    albuterol sulfate HFA (VENTOLIN HFA) 108 (90 Base) MCG/ACT inhaler Inhale 2 puffs into the lungs 4 times daily as  needed for Wheezing 18 g 0    carvedilol (COREG) 12.5 MG tablet Take 1 tablet by mouth 2 times daily      Multiple Vitamins-Minerals (THERAPEUTIC MULTIVITAMIN-MINERALS) tablet Take 1 tablet by mouth daily      omeprazole (PRILOSEC) 40 MG delayed release capsule Take 1 capsule by mouth daily 90 capsule 3    aspirin 81 MG chewable tablet Take 1 tablet by mouth daily 30 tablet 11    vitamin D3 (CHOLECALCIFEROL) 125 MCG (5000 UT) TABS tablet Take 1 tablet by mouth daily 30 tablet 11    SITagliptin (JANUVIA) 100 MG tablet Take 1 tablet by mouth daily 90 tablet 3    quinapril (ACCUPRIL) 40 MG tablet quinapril 40 mg tablet   TAKE 1 TABLET BY MOUTH EVERY NIGHT FOR HIGH BLOOD PRESSURE (Patient not taking: Reported on 12/19/2022) 90 tablet 3    REPATHA SURECLICK 140 MG/ML SOAJ INJECT AS DIRECTED EVERY 2 WEEKS 2 Adjustable Dose Pre-filled Pen Syringe 11    insulin lispro, 1 Unit Dial, (HUMALOG/ADMELOG) 100 UNIT/ML SOPN Humalog KwikPen (U-100) Insulin 100 unit/mL subcutaneous   INJECT UP TO 50 UNITS UNDER THE SKIN DAILY AS DIRECTED      ipratropium (ATROVENT HFA) 17 MCG/ACT inhaler Atrovent HFA 17 mcg/actuation aerosol inhaler (Patient not taking: Reported on 02/16/2023)      linaclotide (LINZESS) 290 MCG CAPS capsule Take 1 capsule by mouth as needed      olopatadine (PATANOL) 0.1 % ophthalmic solution Apply 2 drops to eye 2 times daily       No current facility-administered medications on file prior to encounter.       REVIEW OF SYSTEMS  A comprehensive review of systems was negative except for: {Ros - complete:30496}    Objective:      BP (!) 166/66   Pulse 93   Temp 98 F (36.7 C) (Tympanic)   Resp 20     Wt Readings from Last 3 Encounters:   01/28/23 99.3 kg (218 lb 14.4 oz)   12/19/22 104.1 kg (229 lb 8 oz)  12/16/22 108.4 kg (239 lb)       PHYSICAL EXAM  General Appearance/Constitutional: alert and oriented to person, place and time,  and in no acute distress. Nontoxic.  Skin: warm and dry, no rash, positive wound  per LDA documentation if applicable.  Head: normocephalic and atraumatic.  Eyes: extraocular eye movements intact, conjunctivae normal, and sclera anicteric.  ENT: hearing grossly normal bilaterally. Normal appearance.  Pulmonary/Chest: no chest wall tenderness and clear anteriorly. No respiratory distress.  Cardiovascular: normal rate and regular rhythm.  GI: abdomen soft, non-tender and non-distended.  Musculoskeletal: normal range of motion of joints. Nontender calves. No cyanosis. Nontender calves. Edema {Numbers; 1-4+ (neg and trace):16140}  Neurologic: no gross cranial nerve deficit and speech normal. No focal deficits. Mental status normal.    Medical Decision Making:     ***  Assessment required other independent historian(s): {YES/NO:19726}. Additional Historian: {Historians:34794}.   Comorbid conditions affecting wound healing: As noted in PMH and PSH which was reviewed.    Problem List Items Addressed This Visit          Other    Venous stasis ulcer of left calf with fat layer exposed without varicose veins (HCC) - Primary       Wounds and Treatment Plan:      Other diagnoses or problems addressed:      Pertinent labs reviewed.   Review of medical records and external note (s) from other providers was done for this visit.    New lab or imaging orders placed:  ***    Prescription drug management: {SH N/A :26378}     Risk of complications and/or mortality of patient management:  This patient has a {Risk:35298} risk of morbidity and mortality from additional diagnostic testing or treatment. This is due to the above conditions affecting wound healing as well as patient and procedure risk factors. Education and discussion held with patient regarding these disease processes pertinent to wound(s).  Other pertinent decisions include: {Other pertinent decisions:35299}.  The patient's diagnosis or treatment {IS/IS NOT:19932} significantly limited by social determinants of health as noted by: {Limited social  determinants:35300}.    Discussion of management or test interpretation with {Discussion management:35297}.    Time spent with patient and patient care issues above the usual time needed for wound assessment and treatment was: []  15-20 min  []  21-30 min  []  31-44 min  []  45 min or more  This included time retrieving and reviewing records with patient and education provided to patient regarding disease process(es), offloading or pressure relief, nutrition needed for wound healing, smoking cessation when applicable, and infection risk.    This time also included physician non-face-to-face service time visit on the date of service such as  Preparing to see the patient (eg, review of tests)  Obtaining and/or reviewing separately obtained history  Performing a medically necessary appropriate examination and/or evaluation  Counseling and educating the patient/family/caregiver  Ordering medications, tests, or procedures  Referring and communicating with other health care professionals as needed  Documenting clinical information in the electronic or other health record  Independently interpreting results (not reported separately) and communicating results to the patient/family/caregiver  Care coordination (not reported separately)    Wound 12/15/22 Leg Left;Lower;Anterior (Active)   Number of days: 62       Wound 02/16/23 Pretibial Distal;Left;Medial (Active)   Wound Image   02/16/23 1007   Dressing Status Old drainage noted 02/16/23 1007   Wound Cleansed Cleansed with saline 02/16/23 1007   Offloading  for Diabetic Foot Ulcers Offloading not ordered 02/16/23 1007   Wound Length (cm) 13.6 cm 02/16/23 1007   Wound Width (cm) 8 cm 02/16/23 1007   Wound Depth (cm) 0.1 cm 02/16/23 1007   Wound Surface Area (cm^2) 108.8 cm^2 02/16/23 1007   Wound Volume (cm^3) 10.88 cm^3 02/16/23 1007   Wound Assessment Pink/red;Slough 02/16/23 1007   Drainage Amount Moderate (25-50%) 02/16/23 1007   Drainage Description Serosanguinous 02/16/23  1007   Odor None 02/16/23 1007   Peri-wound Assessment Maceration;Blanchable erythema 02/16/23 1007   Margins Defined edges 02/16/23 1007   Wound Thickness Description not for Pressure Injury Full thickness 02/16/23 1007   Number of days: 0       Wound 02/16/23 Tibial Left;Posterior (Active)   Wound Image   02/16/23 1007   Dressing Status Old drainage noted 02/16/23 1007   Wound Cleansed Cleansed with saline 02/16/23 1007   Offloading for Diabetic Foot Ulcers Offloading not ordered 02/16/23 1007   Wound Length (cm) 8 cm 02/16/23 1007   Wound Width (cm) 3 cm 02/16/23 1007   Wound Depth (cm) 0.1 cm 02/16/23 1007   Wound Surface Area (cm^2) 24 cm^2 02/16/23 1007   Wound Volume (cm^3) 2.4 cm^3 02/16/23 1007   Wound Assessment Pink/red 02/16/23 1007   Drainage Amount Small (< 25%) 02/16/23 1007   Drainage Description Serosanguinous 02/16/23 1007   Odor None 02/16/23 1007   Peri-wound Assessment Blanchable erythema;Maceration 02/16/23 1007   Margins Flat/open edges 02/16/23 1007   Wound Thickness Description not for Pressure Injury Partial thickness 02/16/23 1007   Number of days: 0       Wound 02/16/23 Pretibial Left;Lateral (Active)   Wound Image   02/16/23 1007   Dressing Status Old drainage noted 02/16/23 1007   Wound Cleansed Cleansed with saline 02/16/23 1007   Offloading for Diabetic Foot Ulcers Offloading not ordered 02/16/23 1007   Wound Length (cm) 7 cm 02/16/23 1007   Wound Width (cm) 8.5 cm 02/16/23 1007   Wound Depth (cm) 0.1 cm 02/16/23 1007   Wound Surface Area (cm^2) 59.5 cm^2 02/16/23 1007   Wound Volume (cm^3) 5.95 cm^3 02/16/23 1007   Wound Assessment Pink/red 02/16/23 1007   Drainage Amount Small (< 25%) 02/16/23 1007   Drainage Description Serosanguinous 02/16/23 1007   Odor None 02/16/23 1007   Peri-wound Assessment Blanchable erythema 02/16/23 1007   Margins Flat/open edges 02/16/23 1007   Wound Thickness Description not for Pressure Injury Partial thickness 02/16/23 1007   Number of days: 0           Procedures done this visit:   Procedure Note  Indications:  Based on my examination of this patient's wound(s)/ulcer(s) today, debridement {IS/IS NOT:432 370 0991} required to promote healing and evaluate the wound base.  Performed by: Ernestina Penna, MD  Consent obtained:  {yes ZO:109604}  Time out taken:  {yes no:315493}  Pain Control:     Debridement: {Debridement :23294}  Using {DEBRIDEMENT INSTRUMENTS:21376} the wound(s)/ulcer(s) was/were debrided down through and including the removal of {WOUND CARE DEBRIDEMENT:304201380}.      Devitalized Tissue Debrided:  {DEVITALIZED TISSUE DEBRIDED:304201389}  Pre Debridement Measurements:  Are located in the Wound/Ulcer Documentation Flow Sheet  Diabetic/Pressure/Non Pressure Ulcers only:  Ulcer: {Diabetic/Pressure/Non Pressure Ulcers only:23295}   Wound/Ulcer #: { WOUNDS NUMBERS:304201390}  Post Debridement Measurements:  Wound/Ulcer Descriptions are Pre Debridement except measurements:  Total Surface Area Debrided:  *** sq cm   Estimated Blood Loss:  {ESTIMATED BLOOD VWUJ:811914782}  Hemostasis Achieved:  {  CONTROLLED UJWJ:191478295}  Procedural Pain:  {NUMBERS 0-10:20300}  / 10   Post Procedural Pain:  {NUMBERS 0-10:20300} / 10   Response to treatment:  {HH AOZHYQMVH:846962}      Written patient dismissal instructions given to patient and signed by patient or POA.  Patient voiced understanding that the importance of adherence to instructions is paramount to wound healing improvement or success.     Electronically signed by Ernestina Penna, MD on 02/16/2023 at 12:08 PM

## 2023-02-16 NOTE — Other (Signed)
02/16/23 1007   Right Leg Edema Point of Measurement   Leg circumference 38 cm   Ankle circumference 22 cm   Left Leg Edema Point of Measurement   Leg circumference 43.5 cm   Ankle circumference 23.5 cm   RLE Neurovascular Assessment   Capillary Refill Less than/Equal to 3 seconds   Color Appropriate for Ethnicity   Temperature Warm   RLE Sensation  Full sensation   R Pedal Pulse +2   LLE Neurovascular Assessment   Capillary Refill Less than/Equal to 3 seconds   Color Appropriate for Ethnicity   Temperature Warm   LLE Sensation  Full sensation   L Pedal Pulse +2   Wound 02/16/23 Pretibial Distal;Left;Medial   Date First Assessed/Time First Assessed: 02/16/23 1007   Present on Original Admission: Yes  Wound Approximate Age at First Assessment (Weeks): 4 weeks  Location: Pretibial  Wound Location Orientation: Distal;Left;Medial   Wound Image    Dressing Status Old drainage noted   Wound Cleansed Cleansed with saline   Offloading for Diabetic Foot Ulcers Offloading not ordered   Wound Length (cm) 13.6 cm   Wound Width (cm) 8 cm   Wound Depth (cm) 0.1 cm   Wound Surface Area (cm^2) 108.8 cm^2   Wound Volume (cm^3) 10.88 cm^3   Wound Assessment Pink/red;Slough   Drainage Amount Moderate (25-50%)   Drainage Description Serosanguinous   Odor None   Peri-wound Assessment Maceration;Blanchable erythema   Margins Defined edges   Wound Thickness Description not for Pressure Injury Full thickness   Wound 02/16/23 Tibial Left;Posterior   Date First Assessed/Time First Assessed: 02/16/23 1009   Present on Original Admission: Yes  Wound Approximate Age at First Assessment (Weeks): 4 weeks  Location: Tibial  Wound Location Orientation: Left;Posterior   Wound Image    Dressing Status Old drainage noted   Wound Cleansed Cleansed with saline   Offloading for Diabetic Foot Ulcers Offloading not ordered   Wound Length (cm) 8 cm   Wound Width (cm) 3 cm   Wound Depth (cm) 0.1 cm   Wound Surface Area (cm^2) 24 cm^2   Wound Volume (cm^3)  2.4 cm^3   Wound Assessment Pink/red   Drainage Amount Small (< 25%)   Drainage Description Serosanguinous   Odor None   Peri-wound Assessment Blanchable erythema;Maceration   Margins Flat/open edges   Wound Thickness Description not for Pressure Injury Partial thickness   Wound 02/16/23 Pretibial Left;Lateral   Date First Assessed/Time First Assessed: 02/16/23 1010   Present on Original Admission: Yes  Wound Approximate Age at First Assessment (Weeks): 4 weeks  Location: Pretibial  Wound Location Orientation: Left;Lateral   Wound Image    Dressing Status Old drainage noted   Wound Cleansed Cleansed with saline   Offloading for Diabetic Foot Ulcers Offloading not ordered   Wound Length (cm) 7 cm   Wound Width (cm) 8.5 cm   Wound Depth (cm) 0.1 cm   Wound Surface Area (cm^2) 59.5 cm^2   Wound Volume (cm^3) 5.95 cm^3   Wound Assessment Pink/red   Drainage Amount Small (< 25%)   Drainage Description Serosanguinous   Odor None   Peri-wound Assessment Blanchable erythema   Margins Flat/open edges   Wound Thickness Description not for Pressure Injury Partial thickness   BP (!) 166/66   Pulse 93   Temp 98 F (36.7 C) (Tympanic)   Resp 20

## 2023-02-16 NOTE — Other (Deleted)
02/16/23 1007   Wound 02/16/23 Pretibial Distal;Left;Medial   Date First Assessed/Time First Assessed: 02/16/23 1007   Present on Original Admission: Yes  Wound Approximate Age at First Assessment (Weeks): 4 weeks  Location: Pretibial  Wound Location Orientation: Distal;Left;Medial   Wound Image    Dressing Status Old drainage noted   Wound Cleansed Cleansed with saline   Offloading for Diabetic Foot Ulcers Offloading not ordered   Wound Length (cm) 13.6 cm   Wound Width (cm) 8 cm   Wound Depth (cm) 0.1 cm   Wound Surface Area (cm^2) 108.8 cm^2   Wound Volume (cm^3) 10.88 cm^3   Wound Assessment Pink/red;Slough   Drainage Amount Moderate (25-50%)   Drainage Description Serosanguinous   Odor None   Peri-wound Assessment Maceration;Blanchable erythema   Margins Defined edges   Wound Thickness Description not for Pressure Injury Full thickness   Wound 02/16/23 Tibial Left;Posterior   Date First Assessed/Time First Assessed: 02/16/23 1009   Present on Original Admission: Yes  Wound Approximate Age at First Assessment (Weeks): 4 weeks  Location: Tibial  Wound Location Orientation: Left;Posterior   Wound Image    Dressing Status Old drainage noted   Wound Cleansed Cleansed with saline   Offloading for Diabetic Foot Ulcers Offloading not ordered   Wound Length (cm) 8 cm   Wound Width (cm) 3 cm   Wound Depth (cm) 0.1 cm   Wound Surface Area (cm^2) 24 cm^2   Wound Volume (cm^3) 2.4 cm^3   Wound Assessment Pink/red   Drainage Amount Small (< 25%)   Drainage Description Serosanguinous   Odor None   Peri-wound Assessment Blanchable erythema;Maceration   Margins Flat/open edges   Wound Thickness Description not for Pressure Injury Partial thickness   Wound 02/16/23 Pretibial Left;Lateral   Date First Assessed/Time First Assessed: 02/16/23 1010   Present on Original Admission: Yes  Wound Approximate Age at First Assessment (Weeks): 4 weeks  Location: Pretibial  Wound Location Orientation: Left;Lateral   Wound Image    Dressing  Status Old drainage noted   Wound Cleansed Cleansed with saline   Offloading for Diabetic Foot Ulcers Offloading not ordered   Wound Length (cm) 7 cm   Wound Width (cm) 8.5 cm   Wound Depth (cm) 0.1 cm   Wound Surface Area (cm^2) 59.5 cm^2   Wound Volume (cm^3) 5.95 cm^3   Wound Assessment Pink/red   Drainage Amount Small (< 25%)   Drainage Description Serosanguinous   Odor None   Peri-wound Assessment Blanchable erythema   Margins Flat/open edges   Wound Thickness Description not for Pressure Injury Partial thickness     BP (!) 166/66   Pulse 93   Temp 98 F (36.7 C) (Tympanic)   Resp 20

## 2023-02-16 NOTE — Patient Instructions (Addendum)
Discharge Instructions for  [facility]  [address] [address]  Phone: [phone] Fax: [phone]    NAME:  CALYPSO CASASANTA  DATE OF BIRTH:  1941-09-10  MEDICAL RECORD NUMBER:  [id number]  DATE:  February 16, 2023    WOUND CARE ORDERS:  Left lower leg wounds- Cleanse with baby shampoo. Rinse and pat dry. Apply santyl ointment to wound bed. Cover with hydrofera blue ready. Cover with abd pad. Secure with 2 layer with calamine for compression. Change three times a week. Home care to change between clinic visits.     Santyl ointment will come from [employer]. This will be delivered to your home address on file.     Activity:  [x]$  Elevate leg(s) above the level of the heart when sitting.  [x]$  Avoid prolonged standing in one place.   [x]$  Do no get dressing/wrap wet.       Dietary:  []$  Diet as tolerated      [x]$  Diabetic Diet            []$  Increase Protein: examples (Meat, cheese, eggs, greek yogurt, fish, nuts)          []$  Juven Therapeutic Nutrition Powder     Return Appointment:  [x]$  Return Appointment: With Dr. Darrel Hoover in 1 week.   []$  Nurse Visit :   []$  Ordered tests:      Electronically signed Caesar Chestnut, RN on 02/16/2023 at 10:25 [place] Information: Should you experience any significant changes in your wound(s) or have questions about your wound care, please contact the [place] at [place] 8:00 am - 4:30.  If you need help with your wound outside these hours and cannot wait until we are again available, contact your PCP or go to the hospital emergency room.     PLEASE NOTE: IF YOU ARE UNABLE TO OBTAIN WOUND SUPPLIES, CONTINUE TO USE THE SUPPLIES YOU HAVE AVAILABLE UNTIL YOU ARE ABLE TO REACH [place]. IT IS MOST IMPORTANT TO KEEP THE WOUND COVERED AT ALL TIMES.     Physician Signature:_______________________    Date: ___________ Time:  ____________

## 2023-02-17 NOTE — Telephone Encounter (Signed)
Patient called stating her leg was itching and she has had to take benadryl. Patient requesting dressing change claiming that there is honey on her leg. Advised patient that we did not use any honey products yesterday, patient confused as to why it is itching. Patient requesting dressing change. Advised patient to remove dressing and wash her leg. Patient states that she is going to keep it on until home care comes out tomorrow. Advised patient to remove dressing before home health tomorrow if she is having an allergic reaction. Patient verbalized understanding. Sent message to provider for different dressing orders to send to home health. No other needs at this time.

## 2023-02-19 ENCOUNTER — Ambulatory Visit: Payer: MEDICARE | Attending: Internal Medicine | Primary: Internal Medicine

## 2023-02-19 NOTE — Telephone Encounter (Signed)
Pt was in too much pain to accomplish the physical therapy today. Please send medication to pharmacy for pain for the patient.  JW, lpn

## 2023-02-20 MED ORDER — COLLAGENASE 250 UNIT/GM EX OINT
250 | CUTANEOUS | 0 refills | Status: AC
Start: 2023-02-20 — End: 2023-03-02

## 2023-02-20 NOTE — Telephone Encounter (Signed)
Received call from patient stating she is still having itching and pain with calamine compression wraps on bilateral legs. She has taken Benadryl for the itching which was helpful but still having pain. Patient was encouraged to continue Benadryl for itching and may need to remove compression wraps if too painful. She verbalized understanding and was in agreement with this plan. No further questions or concerns at this time.

## 2023-02-23 ENCOUNTER — Ambulatory Visit: Payer: MEDICARE | Attending: Radiation Oncology | Primary: Internal Medicine

## 2023-02-26 ENCOUNTER — Inpatient Hospital Stay: Admit: 2023-02-26 | Discharge: 2023-02-26 | Payer: MEDICARE | Attending: Surgery | Primary: Internal Medicine

## 2023-02-26 DIAGNOSIS — L97222 Non-pressure chronic ulcer of left calf with fat layer exposed: Secondary | ICD-10-CM

## 2023-02-26 DIAGNOSIS — I872 Venous insufficiency (chronic) (peripheral): Secondary | ICD-10-CM

## 2023-02-26 MED ORDER — COLLAGENASE 250 UNIT/GM EX OINT
250 | Freq: Once | CUTANEOUS | Status: AC
Start: 2023-02-26 — End: 2023-02-26
  Administered 2023-02-26: 15:00:00 via TOPICAL

## 2023-02-26 NOTE — Patient Instructions (Addendum)
Discharge Instructions for  [facility]  [address] [place] [place] [address]  Phone: [phone] Fax: [phone]    NAME:  Jamie Bryant  DATE OF BIRTH:  [birth year]  MEDICAL RECORD NUMBER:  [id number]  DATE:  February 26, 2023    WOUND CARE ORDERS:  Left lower leg wounds- Cleanse with baby shampoo. Rinse and pat dry. Apply santyl ointment to wound bed. Cover with hydrofera blue ready. Cover with abd pad. Secure with 2 layer with calamine for compression. Change three times a week. Home care to change between clinic visits.     Activity:  '[x]'$  Elevate leg(s) above the level of the heart when sitting.  '[x]'$  Avoid prolonged standing in one place.   '[x]'$  Do no get dressing/wrap wet.       Dietary:  '[]'$  Diet as tolerated      '[x]'$  Diabetic Diet            '[]'$  Increase Protein: examples (Meat, cheese, eggs, greek yogurt, fish, nuts)          '[]'$  Juven Therapeutic Nutrition Powder     Return Appointment:  '[x]'$  Return Appointment: With Dr. Darrel Hoover in 1 week.   '[]'$  Nurse Visit :   '[]'$  Ordered tests:      Electronically signed Caesar Chestnut, RN on 02/26/2023 at [place] Information: Should you experience any significant changes in your wound(s) or have questions about your wound care, please contact the [facility] at [place] 8:00 am - 4:30.  If you need help with your wound outside these hours and cannot wait until we are again available, contact your PCP or go to the hospital emergency room.     PLEASE NOTE: IF YOU ARE UNABLE TO OBTAIN WOUND SUPPLIES, CONTINUE TO USE THE SUPPLIES YOU HAVE AVAILABLE UNTIL YOU ARE ABLE TO REACH [place]. IT IS MOST IMPORTANT TO KEEP THE WOUND COVERED AT ALL TIMES.     Physician Signature:_______________________    Date: ___________ Time:  ____________

## 2023-02-26 NOTE — Other (Signed)
02/26/23 0930   Left Leg Edema Point of Measurement   Leg circumference 40.5 cm   Ankle circumference 24 cm   LLE Neurovascular Assessment   Capillary Refill Less than/Equal to 3 seconds   Color Appropriate for Ethnicity   Temperature Warm   LLE Sensation  Full sensation   L Pedal Pulse +1   Wound 02/16/23 Pretibial Distal;Left;Medial   Date First Assessed/Time First Assessed: 02/16/23 1007   Present on Original Admission: Yes  Wound Approximate Age at First Assessment (Weeks): 4 weeks  Location: Pretibial  Wound Location Orientation: Distal;Left;Medial   Wound Image    Dressing Status Old drainage noted;Intact;New drainage noted   Wound Cleansed Soap and water   Offloading for Diabetic Foot Ulcers Offloading not required   Wound Length (cm) 4.5 cm   Wound Width (cm) 5.2 cm   Wound Depth (cm) 0.1 cm   Wound Surface Area (cm^2) 23.4 cm^2   Change in Wound Size % (l*w) 78.49   Wound Volume (cm^3) 2.34 cm^3   Wound Healing % 78   Wound Assessment Pink/red;Slough   Drainage Amount Moderate (25-50%)   Drainage Description Serosanguinous   Odor Moderate   Peri-wound Assessment Maceration;Blanchable erythema   Margins Flat/open edges   Wound Thickness Description not for Pressure Injury Full thickness   Wound 02/16/23 Tibial Left;Posterior   Date First Assessed/Time First Assessed: 02/16/23 1009   Present on Original Admission: Yes  Wound Approximate Age at First Assessment (Weeks): 4 weeks  Location: Tibial  Wound Location Orientation: Left;Posterior   Wound Image    Dressing Status Old drainage noted;New drainage noted;Intact   Wound Cleansed Soap and water   Offloading for Diabetic Foot Ulcers Offloading not required   Wound Length (cm) 2 cm   Wound Width (cm) 1 cm   Wound Depth (cm) 0.1 cm   Wound Surface Area (cm^2) 2 cm^2   Change in Wound Size % (l*w) 91.67   Wound Volume (cm^3) 0.2 cm^3   Wound Healing % 92   Wound Assessment Pink/red   Drainage Description Serosanguinous   Odor None   Peri-wound Assessment  Blanchable erythema;Dry/flaky   Margins Flat/open edges   Wound Thickness Description not for Pressure Injury Full thickness   [REMOVED] Wound 02/16/23 Pretibial Left;Lateral   Final Assessment Date/Final Assessment Time: 02/26/23 0947  Date First Assessed/Time First Assessed: 02/16/23 1010   Present on Original Admission: Yes  Wound Approximate Age at First Assessment (Weeks): 4 weeks  Location: Pretibial  Wound Location Or...   Wound Image    Dressing Status Old drainage noted;Intact   Wound Cleansed Soap and water   Wound Length (cm) 0 cm   Wound Width (cm) 0 cm   Wound Depth (cm) 0 cm   Wound Surface Area (cm^2) 0 cm^2   Change in Wound Size % (l*w) 100   Wound Volume (cm^3) 0 cm^3   Wound Healing % 100   Wound Assessment Epithelialization   Drainage Amount None (dry)   Odor None   Pain Assessment   Pain Assessment 0-10   Pain Level 8   Patient's Stated Pain Goal 0 - No pain   Pain Location Leg   Pain Orientation Left     BP (!) 114/43 Comment: denise lightheadedness  Pulse 80   Temp 97.8 F (36.6 C) (Temporal)   Resp 18

## 2023-02-26 NOTE — Other (Signed)
02/26/23 1003   Right Leg Edema Point of Measurement   Compression Therapy 2 layer compression wrap   Left Leg Edema Point of Measurement   Compression Therapy 2 layer compression wrap   Wound 02/16/23 Pretibial Distal;Left;Medial   Date First Assessed/Time First Assessed: 02/16/23 1007   Present on Original Admission: Yes  Wound Approximate Age at First Assessment (Weeks): 4 weeks  Location: Pretibial  Wound Location Orientation: Distal;Left;Medial   Dressing Status New dressing applied   Wound Cleansed Soap and water   Dressing/Treatment Hydrofera blue;ABD  (Santyl; two layer calamine wrap)   Offloading for Diabetic Foot Ulcers Offloading not required   Wound 02/16/23 Tibial Left;Posterior   Date First Assessed/Time First Assessed: 02/16/23 1009   Present on Original Admission: Yes  Wound Approximate Age at First Assessment (Weeks): 4 weeks  Location: Tibial  Wound Location Orientation: Left;Posterior   Dressing Status New dressing applied   Wound Cleansed Soap and water   Dressing/Treatment ABD;Hydrofera blue  (Santyl, two layer calamine wrap)   Offloading for Diabetic Foot Ulcers Offloading not required   Pain Assessment   Pain Assessment 0-10   Pain Level 8   Patient's Stated Pain Goal 0 - No pain   Pain Location Leg   Pain Orientation Left     Discharge Condition: Stable    Pain: 8    Ambulatory Status:Rollator Walker    Discharge Destination: Home    Transportation:Car    Accompanied by: Family    Discharge instructions reviewed with Self and Family and copy or written instructions have been provided. All questions/concerns have been addressed at this time.
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
## 2023-02-27 NOTE — Patient Instructions (Signed)
[employer]  (Call 211 if need more resources.)    [place]  What they offer: The Standard Pacific Program helps uninsured patients who do not qualify for government-sponsored health insurance and cannot afford to pay for their medical care. Insured patients may also qualify for assistance based on family income, family size, and medical needs.  Phone Number: [phone]  How to apply for the [place] Program:  Option 1: To apply for financial assistance, a patient (or their family or other provider) should fill out the Patent examiner. Copies of the Financial Assistance Application and the FAP may be obtained for free by calling the [employer] customer service department at [phone].  Option 2: The Patent examiner and policy may be obtained for free by downloading a copy from the [employer]:  https://www.bonsecours.com/patient-resources/financial-assistance  Applications are available in several languages on the website    [place]  What they offer: [facility] has a Gaffer that provides free or discounted health care to qualified patients.  Website:  GunRadar.com.au  Phone Number for Patient Benefit Advisors: [phone]    [place]  What they offer: Help with understanding a bill, finding out what insurance pays, applying for financial aid, or setting up a payment plan.  Website:  ScrubPoker.cz  Financial Counseling Call Center: [place]  What they offer:   Mobile healthcare resources for uninsured patients.  Contact: [phone]        IVNA  What they offer:  Healthcare screenings and vaccines.  Contact: [phone]        Every Women's Life (EWL)  What they offer:  Mammograms  and PAP smears regardless of ability to pay.  Contact: [phone]        [place]  What they offer: [facility] provides financial assistance to patients based on their income, assets, and needs. Assistance may also be provided in obtaining free or low-cost health insurance or arranging a manageable payment plan.  Website: InvestmentInstructor.fi  Assistance application can be downloaded from above website  [place]: [phone]          Medications    Good Rx  What they offer: Good Rx tracks prescription drug prices and provides free drug coupons for discounts on medications.  Website: https://www.goodrx.com/  NeedyMeds  What they offer: NeedyMeds offers free information on medications and healthcare cost savings programs including prescription assistance programs, coupons, and discount programs.  Website: https://www.needymeds.org/  Helpline: (440)275-8195    RX Assist  What they offer: Information about free and low-cost medicine programs.  Website: MarketGadgets.hu    Walmart $4 Prescription Program  What they offer: Prescription Program includes up to a 30-day supply for $4 and a 90-day supply for $10 of some covered generic drugs at commonly prescribed dosages  Website: SolarInventors.es Utilities  CommonHelp  What they offer: Partnership with the [place]. Assist with finding and applying for government funded programs and benefits. You can also update your benefits or report changes through CommonHelp.  Website: ButterJelly.co.za  Phone Number: 937-1IRCVEL [phone])    [place]  What they offer: Beryle Lathe is [employer]'s energy assistance program of last resort for anyone who faces financial hardships from unemployment or family crisis.  Phone Number: [phone]  Address:  [address] [place] [address]  Website: https://www.dominionenergy.com/[place]/billing/billing-options/energyshare    Engineer, civil (consulting) (  EAP) - Department of Social Services  What they offer: EAP assists low-income households in meeting their immediate home energy needs.      Website: ProgramInsider.com.pt  Available assistance:   Crisis Assistance - Heating Emergencies  Fuel Assistance - Offset Heating Fuel Costs  Cooling Assistance - Applies to Cooling Utility Bills and Equipment  How to apply:  Online:  MarathonShow.se  Call:  [phone]   Paper application:   Print application from ProgramInsider.com.pt and submit to your local Department of [place] contacts: TheyParty.dk.[place], [place] Utility Affordability Programs  https://www.rubio.com/  Call:  [phone]   Email:  dpucustserv@rva .gov  Programs available:  Equal Monthly Payment Plan, MetroCare Clear Channel Communications Program, The Mutual of Omaha Program, Artist, Engineer, civil (consulting), Solicitor, [place], [place] ([place])    [place] ([place])  [place] residents pay their water and [employer]erated [employer] Stores online:  ConsumerMenu.fi  Questions:  Call Promise at ([phone] (M-S 7am-7pm) or email: support@virginialihwap .[place]*  (Call 211 if need more resources.)    [employer] (formerly Physicist, medical)  What they offer: SNAP is used like cash to buy eligible food items from authorized retailers.  Apply for benefits online: ButterJelly.co.za  Apply for benefits by phone: [phone] (M-F 8 am - 7  pm; Sat 9 am - 12 pm)    Feed More Emergency Food Hotline  What they offer: Assistance with finding a food pantry, soup kitchen, or resource near you.  Website:  https://feedmore.org/agency-network/agency-locator/ (search zip code)  Hunger Hotline Inquiry Form: https://feedmore.org/hunger-hotline-inquiry-form/  Hunger Hotline Phone Number:  902-810-4314 x [phone] (M-F 8:00 am-4:00 pm)    Meals on American Financial on Wheels is a program that delivers meals to individuals who have no reliable means for maintaining a healthy diet.  Services are provided by area.     [employer] Service Area:   Cities of [place], [place], [place].  [place], [place], [place], [place], [place], [place], [place], [place], [place], and [employer] Products and Chemicals  Website:  https://feedmore.org/how-we-help/meals-on-wheels/  To Apply:  Ages 31-59:  Apply online: https://feedmore.org/meals-on-wheels-application-form/  Apply by phone: [phone]        Meals on [place] (continued):  To Apply:  Ages 60+:  Apply Online: https://feedmore.org/meals-on-wheels-application-form/  Apply By Phone: [phone] (M-F 830 am- 5 pm)  For [place] of [place], [place] of [place], [place], [place], [place], [place], [place]: You may also apply by calling Guardian Life Insurance, [employer] Bank [employer] America on Aging at [phone].  For Cities of [place], [place], and [place] as well as [address] of [place], [place], [place], [place], and [place]:    Probation officer on Aging: [place]:   [place], [place], [place], [place], [place], [place], [place], [place], [place] and [place].  Website: CompPlans.co.za  To Apply by phone: [phone]    [place] ([place]) area:   [place] of [place], [place], [place], [place], [place], [place], and [place].   Website: http://www.huynh.org/.html  For More Information: Call 2188408757 or email meals@psraaa .org    Meals on [place]:  Website: CardRetirement.cz  To Apply Online: SharperDiscounts.dk  To Apply by phone: [phone]    Meals on [facility]:  [place]:    [place], [place] [address]  [place]; Monday-Thursdays 10 am- 1 pm     [place], [place] [address]    [phone] Tuesday and Thursday 10 am -12:00 pm  [place], [place], [place]; 8-11:30 [place] [address]   [phone]; 1st and 3rd Tuesdays 9-11 am, 3rd Saturdays 9-11 am    3M Company and Blenders - [address] [place] [address]    [phone]; 2nd, 3rd, and 4th Thursdays 10 am - 12 pm    [facility] of [place], [place]   [phone]; Mondays 10 am-2 pm & Wednesdays 12:45 pm - 230 pm    [place], [place] [address]   [phone]; Thursdays 1- 4 pm    [place], [place] [address]  [phone]; 3rd Wednesdays of the month 1-3 pm    [place]   2nd and 4th Monday, distribution starts at 5 pm, [facility]   [address] [place], [place] [address]  1st and 3rd Fridays, distribution starts at 4:30 pm  [place], [place] [address]      [place] Food Henry (continued)   Saturdays, distribution starts at 10 am  [address], [place] [address]  2nd - AM [employer], [place]. [address], [place] [address]  [place], [address], [place]  [address]  4th - JA [place], [address], [place] [address]    [place] [facility] - [address], [place] [address]  [phone]; Thursdays 530 pm -730 pm & Fridays 1130 am - 130 pm    [place]  [address]   [place] [address]   [phone]; Monday and Tuesday, 2 pm, 3rd Wednesday 12 pm, 4th Friday 9 am    [place] [address]  [phone]; Saturday 8-10:30 am     [place] [address]   [phone]; Tuesday 9 am - 12 pm & 3-5 pm, Friday 3-5 pm, Saturday 9 am - 12 pm                   [employer]*  (Call 211 if need more resources)    [place] (Local)  What they offer: The line facilitates access to resources and shelter alternatives for those who are currently homeless or 3 days or less away from losing their housing.  Areas served: [place], [place], [place], [place], [place], [place], [place] of [place]  Phone Number: [phone] **Contact this number first. It is a centralized point of entry for services.  Hours of Operation: Monday - Friday 8am - 9 pm; Saturday & Sunday 1pm - 9pm  Homeward  What they offer: Homeward is the regional planning and coordinating agency for homeless services.  Website:  BeverageMobile.se  [place] are a Recruitment consultant for those seeking resources. These sheets are located on the [facility] home page.  Phone Number: [phone] ext.[phone]  Address: [place], [place], [place] [address]    Department of [place]  What they offer: Free 24/7 access to trained counselors for local resources and assistance  Website: MarketRepair.com.asp  Phone Number:  [phone] (text messaging accepted)    Housing [employer]  What they offer: Centralized line to help residents connect to programs and services that will help address their housing needs.  Website:  TreatmentWindow.hu  Phone Number: [phone]          Medicaid [place] (CCCP)  What they offer: Housing assistance resource for renters, homeowners, and those who are homeless.  Contact Information: Members should contact the member services number on the back of their insurance card to connect with a "housing specialist and/or care coordinator" to receive assistance.    [place] ([place])  What they offer: RRHA provides homes for low-income families, seniors, and the disabled.  Website: http://www.walters-mcconnell.com/  Phone Number: [phone]  Address: [address], [place] [address]      U.S. Department of Housing and Teacher, music (HUD)  What they offer: Public housing assistance  Website: PhoneCaptions.ch  Additional Information: select your state from the website to find location specific contact information.    [place]  What they offer: Statewide information and referral for other housing needs.  Website: https://www.LumberShow.gl.php  Phone Number: Dial 2-1-1 or 229-655-8739       [place]  What they offer: Homeless services for [place]  Website: RebankingSpace.hu  Phone Number: (661)[phone]    [employer] Corporation  What they offer: Avaya attain quality affordable housing.  Website: https://www.virginiahousing.com/en  Phone Number: [phone]  Email: InternalAudit'@VirginiaHousing'$ .com    Virginiahousingsearch.com  What they offer: Free resource to help find a home that fits your needs and budget. Property providers can list apartments or homes  for rent at any time.  Website: DentistProfiles.fi  Phone Number: [phone] Monday - Friday 9 am - 8 pm    [employer]rah[employer] Entertainment*  (Call 211 if need more resources.)    Dillard's  What they offer: Provides public transportation to the [place] area and parts of [place] and [place].  Website: GreatestFeeling.no  Phone Number: [phone]    [place] ([place])  What they offer: Provides public transportation access to individuals with disabilities who may not reasonably be able to use [place] fixed route bus service. Provides origin-to-destination services under the guidelines of the ADA.  Website: ResumeSeminar.com.pt  Phone Number: [phone]    Senior Connections Ride Connection  What they offer: [place] provides 2 round trip rides/month to non-emergency medical appointments (must qualify)  Website:  https://seniorconnections-va.org/services/support-to-stay-home/ride-connections/  Phone Number: 714-585-8870  Eligibility Requirements: Individuals with disabilities (18+) or older adults (76+) and live in the [place] or the counties of [place], [place], [place], [place], [place], [place] and [place].    Let's Go Services  What they offer: Donation based transportation services for veterans, families in need, the elderly, and those with disabilities.  Website: https://www.letsgoservices.org/  Phone Number: [phone]  Additional Information: Offers transport  to appointments, grocery store, airport, etc. in the areas of [place], [place], [place], [place], and [place].      [place] transit service serving [place], [place], [place], [place] and [place], [place], [place], [place], [place], [place], [place].  Phone: [phone] Website:  CosmeticsCritic.si    North/Northeast  [place] residents in [place], [place], [place], [place], [place], [place] and [place] counties.  Phone: [phone]  Website: SacredWalls.it.[place] services for residents of [facility] aged 60+ or those with a short term or long-term disability.  Phone: [phone])  Website: GroupBirthday.com.ee  Additional Information: One-way rides are $6 - must book 24 hours in advance  [place] seniors age 38+ who are not able to drive. Transportation is for medical appointments, grocery shopping, or personal business (ex: banking). Seniors must be ambulatory. Areas: [place] [phone], [id number]) and [place] [phone], [address], [address], [phone])  Phone: Jorene Guest area - [phone]; [place] area - [phone]  Website: https://www.FinancialSpecial.nl  Conservation officer, nature for Seniors - Transportation services for residents of [facility] who are 6+, disabled, or have low income (200% below Federal Poverty Level).  Website: http://www.goochlandcares.org/get-help/transportation-services/  Call to schedule an appointment: [place] services for residents of [facility] who are 60+, disabled, or meet income guidelines.                 Phone: [phone]                           DatingOpportunities.is    Medicaid Plans - [place]  Each health plan has options for transportation services. Contact your insurance provider to schedule transportation. Pharmacist, hospital information is listed on the back of the insurance card.    [facility] [place]  Phone: [phone] Website: http://benson.com/  Anthem HealthKeepers Plus  Phone:  [phone] Website: SeekRooms.cz  [facility] Complete Care  Phone: [phone] Website: MCCofVA.[place]  Phone: [phone] or [phone]   Website: optimahealth.com   [employer] Corporation: [phone] Website: HugeFiesta.cz  P[employer]es: [phone] Website: virginiapremier.com      Medicare Advantage Plans    Some plans may provide transportation. Members should contact the Member Services or Transportation number on the back of their insurance card for more information or to schedule transportation.               All stretcher transportation services are private pay    Golden West Financial  Phone: [phone]  Website: https://raaems.org/    American Medical Response  Phone: [phone]  Website: http://mitchell.org/    Delta Response  Phone: [phone]  Website: SecuritiesCard.pl

## 2023-03-05 ENCOUNTER — Inpatient Hospital Stay: Admit: 2023-03-05 | Discharge: 2023-03-05 | Payer: MEDICARE | Attending: Radiation Oncology | Primary: Internal Medicine

## 2023-03-05 DIAGNOSIS — I872 Venous insufficiency (chronic) (peripheral): Secondary | ICD-10-CM

## 2023-03-05 NOTE — Patient Instructions (Addendum)
 Discharge Instructions for  [facility]  [address]  [place], [place] [address]  Phone: [phone] Fax: [phone]    NAME:  Jamie Bryant  DATE OF BIRTH:  1941-05-28  MEDICAL RECORD NUMBER:  [id number]  DATE:  March 05, 2023    WOUND CARE ORDERS:  Left lower leg wounds- Cleanse with baby shampoo. Rinse and pat dry. Apply xeroform to wound bed. Cover with gauze. Secure with 2 layer with calamine for compression. Change three times a week. Home care to change between clinic visits.     Activity:  [x]  Elevate leg(s) above the level of the heart when sitting.  [x]  Avoid prolonged standing in one place.   [x]  Do no get dressing/wrap wet.       Dietary:  []  Diet as tolerated      [x]  Diabetic Diet            []  Increase Protein: examples (Meat, cheese, eggs, greek yogurt, fish, nuts)          []  Juven Therapeutic Nutrition Powder     Return Appointment:  [x]  Return Appointment: With Dr. Dagoberto Cheney in 2 weeks.   []  Nurse Visit :   [x]  Ordered tests: X-ray of left lower leg ordered. Please go to [address], [facility], [place], [place] [address]. Walk ins are welcome.     Electronically signed WARREN DOLLY, RN on 03/05/2023 at 10:44 AM     Wound Care Center Information: Should you experience any significant changes in your wound(s) or have questions about your wound care, please contact the [facility] Outpatient Wound Center at [facility] - FRIDAY 8:00 am - 4:30.  If you need help with your wound outside these hours and cannot wait until we are again available, contact your PCP or go to the hospital emergency room.     PLEASE NOTE: IF YOU ARE UNABLE TO OBTAIN WOUND SUPPLIES, CONTINUE TO USE THE SUPPLIES YOU HAVE AVAILABLE UNTIL YOU ARE ABLE TO REACH US . IT IS MOST IMPORTANT TO KEEP THE WOUND COVERED AT ALL TIMES.     Physician Signature:_______________________    Date: ___________ Time:  ____________

## 2023-03-05 NOTE — Telephone Encounter (Signed)
Pt had lyrica dosage question. Two different dosages were sent to two different pharmacies. Will clarify and notify patient plus pharmacy.

## 2023-03-05 NOTE — Flowsheet Note (Addendum)
 03/05/23 0934   Anesthetic   Anesthetic 4% Lidocaine  Liquid Topical   Right Leg Edema Point of Measurement   Compression Therapy Compression stockings   Left Leg Edema Point of Measurement   Leg circumference 38.5 cm   Ankle circumference 25.5 cm   Compression Therapy 2 layer compression wrap   RLE Neurovascular Assessment   Capillary Refill Less than/Equal to 3 seconds   Color Appropriate for Ethnicity   Temperature Warm   RLE Sensation  Pain   R Pedal Pulse +2   LLE Neurovascular Assessment   Capillary Refill Less than/Equal to 3 seconds   Color Appropriate for Ethnicity   Temperature Warm   LLE Sensation  Pain   L Pedal Pulse +2   Wound 02/16/23 Pretibial Distal;Left;Medial   Date First Assessed/Time First Assessed: 02/16/23 1007   Present on Original Admission: Yes  Wound Approximate Age at First Assessment (Weeks): 4 weeks  Location: Pretibial  Wound Location Orientation: Distal;Left;Medial   Wound Image    Dressing Status Old drainage noted   Wound Cleansed Soap and water    Offloading for Diabetic Foot Ulcers Offloading not required   Wound Length (cm) 4.4 cm   Wound Width (cm) 4.8 cm   Wound Depth (cm) 0.2 cm   Wound Surface Area (cm^2) 21.12 cm^2   Change in Wound Size % (l*w) 80.59   Wound Volume (cm^3) 4.224 cm^3   Wound Healing % 61   Wound Assessment Pink/red;Slough   Drainage Amount Moderate (25-50%)   Drainage Description Serosanguinous   Odor None   Peri-wound Assessment Maceration;Blanchable erythema   Margins Flat/open edges   Wound Thickness Description not for Pressure Injury Full thickness   Wound 02/16/23 Tibial Left;Posterior   Date First Assessed/Time First Assessed: 02/16/23 1009   Present on Original Admission: Yes  Wound Approximate Age at First Assessment (Weeks): 4 weeks  Location: Tibial  Wound Location Orientation: Left;Posterior   Wound Image    Dressing Status Dry;Clean;Intact   Wound Cleansed Soap and water    Offloading for Diabetic Foot Ulcers Offloading not required   Wound  Length (cm) 0.1 cm   Wound Width (cm) 0.1 cm   Wound Depth (cm) 0.1 cm   Wound Surface Area (cm^2) 0.01 cm^2   Change in Wound Size % (l*w) 99.96   Wound Volume (cm^3) 0.001 cm^3   Wound Healing % 100   Wound Assessment Epithelialization   Drainage Amount None (dry)   Odor None   Peri-wound Assessment Intact   Margins Attached edges   Wound Thickness Description not for Pressure Injury Full thickness   Pain Assessment   Pain Assessment 0-10   Pain Level 5   Patient's Stated Pain Goal 0 - No pain   Pain Location Leg   Pain Orientation Left     BP 129/68   Pulse 89   Temp 97.7 F (36.5 C) (Temporal)   Resp 18   Addendum: NO LIDOCAINE  APPLIED; PATIENT ALLERGIC

## 2023-03-05 NOTE — Progress Notes (Signed)
 [name]   [facility] Care [facility]   Medical Staff Progress Note     ANAYS DETORE  MEDICAL RECORD NUMBER:  [id number]  AGE: 82 y.o.   GENDER: female  DOB: 07-Nov-1941  EPISODE DATE:  03/05/2023    Chief complaint and reason for visit:     Chief Complaint   Patient presents for follow up  of  non healing left lower leg venous ulcers     Follow-up      Patient presenting for follow up evaluation of wound(s) per chief complaint.      Subjective and ROS:   [name],  a 82 yo female with history of DM 2; HTN, CVA and chronic venous insufficiency of the lower legs, is seen today at the [facility] at the request of Dr Graylon for evaluation of her left lower leg venous ulcers,  Patient states having had these wounds since September, 2023 but unclear of the cause.  Complains of copious drainage and pain, rating it a 15/10, constant burning and at times sharp. Takes Tylenol  prn for pain but claims it does not help.  Denies any fever or chills.  Patient has Home health care for her wound care but has been non compliant, taking off the compression stockings at times.  Appears to have some hallucination, removing her wraps and states her neighbor caused the wounds to appear. She is currently on Seroquel  but unsure if patient is taking her medication.  She is currently on Antibiotics.  Denies any fever or chills.      Last A1c - 7/0 on 11/03/2022.  Denies smoking.       Patient states she is allergic to Lidocaine  and decline to have it used toda    March 05, 2023  - Patient saw Dr Micheline last week, and was able to remove loose skin  and slough, limited due to patient allergic to lidocaine . She continued with Santyl  and Hydrofera Blue. She had completed her 14 day antibiotics.  On follow up today, the patient had complaint of severe pain 15 of 10 on pain scale over the wound, made worst when wound is exposed to air.   Wound actually appeared improved.  No fever or increased warmth .Sending  for Xray.  Patient comes with her son.    History of Wound Context: Per original history and physical on this patient. 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  Wound appears more dry with evidence of epithelialization along the upper edges.  Hyperpigmented with mild to mild erythema. Hypopigmented in the posterior /calf- unchanged.   No purulent drainage. Painful for patient when touching lower leg and calf but actually allowed me to touch and debride lightly today.  No signs of  active infection.     Treatment - Debridement is recommended to assist in wound healing.  Patient allowed today to debride the wound.  Patient signed the consent for debridement.  No lidocaine  used.  With a curette, the slough was removed gingerly.  She experience minor pain but overall did very well, tolerating the procedure.   Wound to be  cleansed with baby shampoo.  Apply xeroform tot he wound bed and secure with 2 layer calamine for compression  Will send patient for XR of the left lower leg to assess her pain, the level that does not correlate with a healing wound.  R/O osteo  FU in 2 weeks or sooner if need.     Other associated diagnoses or problems addressed:  none    Pertinent imaging reviewed including independent interpretation include:  None    Pertinent labs reviewed.   Medical records and review of external note (s) from other  providers done as well.    New lab or imaging orders placed:  None     Prescription drug management: N/A     Discussion of management or test interpretation with  patient and her son .    Comorbid conditions affecting wound healing: As per PMH which was reviewed.    Risk of complications and/or mortality of patient management:  This patient has a minimal risk of morbidity and mortality from additional diagnostic testing or treatment. This is due to the above conditions affecting wound healing as well as patient and procedure risk factors. Education and discussion held with patient regarding these disease processes pertinent to wound(s).  Other pertinent decisions include: minor surgery or procedures as below.  The patient's diagnosis or treatment is not significantly limited by social determinants of health as noted by: N/A .    Wound 12/15/22 Leg Left;Lower;Anterior (Active)   Number of days: 79       Wound 02/16/23 Pretibial Distal;Left;Medial (Active)   Wound Image   03/05/23 0934   Dressing Status New dressing applied 03/05/23 1103   Wound Cleansed Soap and water  03/05/23 1103   Dressing/Treatment Hydrofera blue;ABD 02/26/23 1003   Offloading for Diabetic Foot Ulcers Offloading not required 03/05/23 1103   Wound Length (cm) 4.4 cm 03/05/23 0934   Wound Width (cm) 4.8 cm 03/05/23 0934   Wound Depth (cm) 0.2 cm 03/05/23 0934   Wound Surface Area (cm^2) 21.12 cm^2 03/05/23 0934   Change in Wound Size % (l*w) 80.59 03/05/23 0934   Wound Volume (cm^3) 4.224 cm^3 03/05/23 0934   Wound Healing % 61 03/05/23 0934   Wound Assessment Pink/red;Slough 03/05/23 0934   Drainage Amount Moderate (25-50%) 03/05/23 0934   Drainage Description Serosanguinous 03/05/23 0934   Odor None 03/05/23 0934   Peri-wound Assessment Maceration;Blanchable erythema 03/05/23 0934   Margins Flat/open edges 03/05/23 0934   Wound Thickness Description not for Pressure Injury Full thickness 03/05/23 0934   Number of days: 17          Procedures done  during this encounter:   Debridement: Excisional Debridement  Indications:  Based on my examination of this patient's wound(s)/ulcer(s) today, debridement is required to promote healing and evaluate the wound base. Risks and benefits discussed with patient who has agreed to proceed.   Performed by: Dagoberto LITTIE Cheney, MD  Consent obtained:  Yes  Time out taken:  Yes  Pain Control:     Using curette the wound(s)/ulcer(s) was/were debrided down through and including the removal of epidermis, dermis, and subcutaneous  tissue.      Devitalized Tissue Debrided: slough  Pre Debridement Measurements:  Are located in the Wound/Ulcer Documentation Flow Sheet  Wound/Ulcer #: 1  Post Debridement Measurements:  Wound/Ulcer Descriptions are Pre Debridement except measurements:  Total Surface Area Debrided:  21 sq cm   Diabetic/Pressure/Non Pressure Ulcers only:  Ulcer: Non-Pressure ulcer, fat layer exposed   Estimated Blood Loss:  None  Hemostasis Achieved:  not needed  Procedural Pain:  15  / 10   Post Procedural Pain:  15 / 10   Response to treatment:  Well tolerated by patient., With complaints of pain, which was a little better than presentation    TIME: E/M Time spent with patient and/or patient care issues: [x]  15-20 min  []  21-30 min  []  31-44 min  []  45 min or more.   This is above the usual time needed to address patient's chief complaint today: [x]  Yes  []  No  This time includes physician non-face-to-face service time visit on the date of service such as  Preparing to see the patient (eg, review of tests)  Obtaining and/or reviewing separately obtained history  Performing a medically necessary appropriate examination and/or evaluation  Counseling and educating the patient/family/caregiver  Ordering medications, tests, or procedures  Referring and communicating with other health care professionals as needed  Documenting clinical information in the electronic or other health record  Independently interpreting results (not  reported separately) and communicating results to the patient/family/caregiver  Care coordination (not reported separately)    Objective:    BP 129/68   Pulse 89   Temp 97.7 F (36.5 C) (Temporal)   Resp 18   Wt Readings from Last 3 Encounters:   01/28/23 99.3 kg (218 lb 14.4 oz)   12/19/22 104.1 kg (229 lb 8 oz)   12/16/22 108.4 kg (239 lb)       PHYSICAL EXAM  General: Alert and in no acute distress. Normal appearing elderly female in NAD, showing no discomfort , resting comfortably  Skin: Warm and dry, no rash  Head: Normocephalic and atraumatic  Eyes: Extraocular eye movements intact, conjunctivae normal, and sclera anicteric  ENT: Hearing grossly normal bilaterally. Normal appearance  Respiratory: no respiratory distress  Musculoskeletal: Baseline range of motion in joints. Nontender calves. No cyanosis. Edema 2+.  Neurologic: Speech normal. At baseline without new focal deficits. Mental status normal or at baseline  Left lower leg wound - much improved. See images and descriptions above    PAST MEDICAL HISTORY        Diagnosis Date    Arthritis     Asthma     Chronic renal disease, stage III (HCC) [698720] 10/13/2022    Depression     Diabetes (HCC)     Fibromyalgia     Fibromyalgia     Gastroparesis     Hyperlipemia     Hypertension     Neuropathy     Psychotic disorder (HCC)     Stroke (HCC)        PAST SURGICAL HISTORY    Past Surgical History:   Procedure Laterality Date    COLONOSCOPY      HYSTERECTOMY (CERVIX STATUS UNKNOWN)         FAMILY HISTORY    Family History   Problem Relation Age of Onset    Breast Cancer Maternal Grandmother     Cancer Father         lung ca    Hypertension Father  Diabetes Mother     Hypertension Mother        SOCIAL HISTORY    Social History     Tobacco Use    Smoking status: Never     Passive exposure: Never    Smokeless tobacco: Never   Vaping Use    Vaping Use: Never used   Substance Use Topics    Alcohol use: No    Drug use: No       ALLERGIES    Allergies    Allergen Reactions    Statins Myalgia     Lovastatin, pravastatin, simvastatin, atorvastatin, zetia, livalo, lovaza    Adhesive Tape Rash    Ampicillin Rash    Gabapentin Nausea And Vomiting    Lidocaine  Rash       MEDICATIONS    Current Outpatient Medications on File Prior to Encounter   Medication Sig Dispense Refill    linezolid  (ZYVOX ) 600 MG tablet Take 1 tablet by mouth 2 times daily Bid FOR 14 DAYS (Patient not taking: Reported on 03/05/2023)      pregabalin  (LYRICA ) 300 MG capsule Take 1 capsule by mouth 2 times daily for 180 days. Max Daily Amount: 600 mg 180 capsule 1    pregabalin  (LYRICA ) 150 MG capsule Take 1 capsule by mouth 3 times daily for 270 days. Max Daily Amount: 450 mg 270 capsule 2    arformoterol  tartrate 15 MCG/2ML NEBU 15 mcg, budesonide  0.25 MG/2ML SUSP 250 mcg Take 1 Dose by nebulization in the morning and 1 Dose in the evening. (Patient not taking: Reported on 12/18/2022) 60 Units 11    albuterol  sulfate HFA (VENTOLIN  HFA) 108 (90 Base) MCG/ACT inhaler Inhale 2 puffs into the lungs 4 times daily as needed for Wheezing (Patient not taking: Reported on 12/18/2022) 54 g 1    albuterol  (PROVENTIL ) (2.5 MG/3ML) 0.083% nebulizer solution Take 3 mLs by nebulization 4 times daily 120 each 3    QUEtiapine  (SEROQUEL ) 25 MG tablet Take 0.5 tablets by mouth nightly (Patient not taking: Reported on 02/16/2023) 60 tablet 3    arformoterol  tartrate (BROVANA ) 15 MCG/2ML NEBU Take 1 ampule by nebulization 2 times daily (Patient not taking: Reported on 12/18/2022) 120 mL 3    budesonide  (PULMICORT ) 0.25 MG/2ML nebulizer suspension Take 2 mLs by nebulization 2 times daily (Patient not taking: Reported on 12/18/2022) 60 each 3    albuterol  sulfate HFA (VENTOLIN  HFA) 108 (90 Base) MCG/ACT inhaler Inhale 2 puffs into the lungs 4 times daily as needed for Wheezing 18 g 0    carvedilol  (COREG ) 12.5 MG tablet Take 1 tablet by mouth 2 times daily      Multiple Vitamins-Minerals (THERAPEUTIC MULTIVITAMIN-MINERALS)  tablet Take 1 tablet by mouth daily      omeprazole  (PRILOSEC) 40 MG delayed release capsule Take 1 capsule by mouth daily 90 capsule 3    aspirin  81 MG chewable tablet Take 1 tablet by mouth daily 30 tablet 11    vitamin D3 (CHOLECALCIFEROL ) 125 MCG (5000 UT) TABS tablet Take 1 tablet by mouth daily 30 tablet 11    SITagliptin  (JANUVIA ) 100 MG tablet Take 1 tablet by mouth daily 90 tablet 3    quinapril  (ACCUPRIL ) 40 MG tablet quinapril  40 mg tablet   TAKE 1 TABLET BY MOUTH EVERY NIGHT FOR HIGH BLOOD PRESSURE (Patient not taking: Reported on 12/19/2022) 90 tablet 3    REPATHA  SURECLICK 140 MG/ML SOAJ INJECT AS DIRECTED EVERY 2 WEEKS 2 Adjustable Dose Pre-filled Pen Syringe 11  insulin  lispro, 1 Unit Dial, (HUMALOG /ADMELOG ) 100 UNIT/ML SOPN Humalog  KwikPen (U-100) Insulin  100 unit/mL subcutaneous   INJECT UP TO 50 UNITS UNDER THE SKIN DAILY AS DIRECTED      ipratropium (ATROVENT HFA) 17 MCG/ACT inhaler Atrovent HFA 17 mcg/actuation aerosol inhaler (Patient not taking: Reported on 02/16/2023)      linaclotide  (LINZESS ) 290 MCG CAPS capsule Take 1 capsule by mouth as needed      olopatadine (PATANOL) 0.1 % ophthalmic solution Apply 2 drops to eye 2 times daily       No current facility-administered medications on file prior to encounter.         Written patient dismissal instructions given to patient and signed by patient or POA.         Electronically signed by Dagoberto LITTIE Cheney, MD on 03/05/2023 at 11:53 AM

## 2023-03-05 NOTE — Flowsheet Note (Signed)
 03/05/23 1103   Right Leg Edema Point of Measurement   Compression Therapy Compression stockings   Left Leg Edema Point of Measurement   Compression Therapy 2 layer compression wrap   Wound 02/16/23 Pretibial Distal;Left;Medial   Date First Assessed/Time First Assessed: 02/16/23 1007   Present on Original Admission: Yes  Wound Approximate Age at First Assessment (Weeks): 4 weeks  Location: Pretibial  Wound Location Orientation: Distal;Left;Medial   Dressing Status New dressing applied   Wound Cleansed Soap and water    Dressing/Treatment   (Xeroform; gauze; compression)   Offloading for Diabetic Foot Ulcers Offloading not required   [REMOVED] Wound 02/16/23 Tibial Left;Posterior   Final Assessment Date/Final Assessment Time: 03/05/23 1048  Date First Assessed/Time First Assessed: 02/16/23 1009   Present on Original Admission: Yes  Wound Approximate Age at First Assessment (Weeks): 4 weeks  Location: Tibial  Wound Location Orien...   Dressing Status New dressing applied   Wound Cleansed Soap and water    Dressing/Treatment   (Xeroform; gauze; compression)   Offloading for Diabetic Foot Ulcers Offloading not required   Pain Assessment   Pain Assessment 0-10   Pain Level 6   Patient's Stated Pain Goal 0 - No pain   Pain Location Leg   Pain Orientation Left     Discharge Condition: Stable    Pain: 6    Ambulatory Status:Rollator Walker    Discharge Destination: Home    Transportation:Car    Accompanied by: Family    Discharge instructions reviewed with Self and Family and copy or written instructions have been provided. All questions/concerns have been addressed at this time.

## 2023-03-10 ENCOUNTER — Inpatient Hospital Stay: Admit: 2023-03-10 | Payer: MEDICARE | Primary: Internal Medicine

## 2023-03-10 DIAGNOSIS — I872 Venous insufficiency (chronic) (peripheral): Secondary | ICD-10-CM

## 2023-03-19 ENCOUNTER — Inpatient Hospital Stay: Admit: 2023-03-19 | Discharge: 2023-03-19 | Payer: MEDICARE | Attending: Radiation Oncology | Primary: Internal Medicine

## 2023-03-19 DIAGNOSIS — I872 Venous insufficiency (chronic) (peripheral): Secondary | ICD-10-CM

## 2023-03-19 NOTE — Progress Notes (Signed)
[facility]   Medical Staff Progress Note     Jamie Bryant RECORD NUMBER:  [id number]  AGE: 82 y.o.   GENDER: female  DOB: September 30, 1941  EPISODE DATE:  03/19/2023    Chief complaint and reason for visit:     Chief Complaint   Patient presents for follow up  of  non healing left lower leg venous ulcers  and now with new scattered right leg wounds    Follow-up     "Take care of left leg and look at right leg"      Patient presenting for follow up evaluation of wound(s) per chief complaint.      Subjective and ROS:     Jamie Bryant,  a 82 yo female with history of DM 2; HTN, CVA and chronic venous insufficiency of the lower legs, is seen today at the [facility] at the request of Dr Shara Blazing for evaluation of her left lower leg venous ulcers,  Patient states having had these wounds since September, 2023 but unclear of the cause.  Complains of copious drainage and pain, rating it a 15/10, constant burning and at times sharp. Takes Tylenol prn for pain but claims it does not help.  Denies any fever or chills.  Patient has Home health care for her wound care but has been non compliant, taking off the compression stockings at times.  Appears to have some hallucination, removing her wraps and states her neighbor caused the wounds to appear. She is currently on Seroquel but unsure if patient is taking her medication.  She is currently on Antibiotics.  Denies any fever or chills.      Last A1c - 7/0 on 11/03/2022.  Denies smoking.       Patient states she is allergic to Lidocaine and decline to have it used toda     March 05, 2023  - Patient saw Dr Elinor Parkinson last week, and was able to remove loose skin  and slough, limited due to patient allergic to lidocaine. She continued with Santyl and Hydrofera Blue. She had completed her 14 day antibiotics.  On follow up today, the patient had complaint of severe pain 15 of 10 on pain scale over the wound, made worst when  wound is exposed to air.   Wound actually appeared improved.  No fever or increased warmth .Sending for Xray.  Patient comes with her son.    March 18, 2020- On follow up, Jamie Bryant states feeling no better.  Wounds are made worst by her neighbor who lives upstairs above her, that she can see that he is spying on her via the lights in her bedroom ceiling. Wound is actually improving on the left.  She now has new tiny ulcers scattered about in the mid right calf weeping clear fluid.  Everything is painful to her. Declined use of lidocane on her wounds.   Her son did not come with her today.  She had lived with him but moved out to her current residency.     History of Wound Context: Per original history and physical on this patient. 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  Slough is present.  Hyperpigmented with mild to mild erythema. Hypopigmented in the posterior /calf- unchanged.   No purulent drainage. Painful for patient to touch.  Barely can use a guaze to gently remove the slough.  This was discontinued.   No active signs of infection.     Right lower leg - tiny punctate wounds with clear serous fluid eminating from the sites mostly mid calf.  Increased edema.  Mild erythema without increased warmth.  Does not appear with  infection.  Treatment - Debridement is recommended for the left lower leg wound but declined by patient due to her pain.  Gauze tried but this caused severe pain .    Wound to be  cleansed with baby shampoo.  Apply xeroform tot he wound bed and secure with 2 layer calamine for compression  FU in 2 weeks   Patient with history of delusion and dementia    Other associated diagnoses or problems addressed:        Pertinent imaging reviewed including independent interpretation include:  None    Pertinent labs reviewed.   Medical records and review of external note (s) from other providers done as well.    New lab or imaging orders placed:  None     Prescription drug management: N/A     Discussion of management or test interpretation with  patient who comes alone today .    Comorbid conditions affecting wound healing: As per PMH which was reviewed.    Risk of complications and/or mortality of patient management:  This patient has a minimal risk of morbidity and mortality from additional diagnostic testing or treatment. This is due to the above conditions affecting wound healing as well as patient and procedure risk factors. Education and discussion held with patient regarding these disease processes pertinent to wound(s).  Other pertinent decisions include: N/A  and minor surgery or procedures as below.  The patient's diagnosis or treatment is  significantly limited by social determinants of health as noted by: less than ideal living conditions and mental health? .    Wound 12/15/22 Leg Left;Lower;Anterior (Active)   Number of days: 94       Wound 02/16/23 Pretibial Distal;Left;Medial (Active)   Wound Image   03/19/23 0950   Wound Etiology Venous 03/19/23 1023   Dressing Status New dressing applied 03/19/23 1023   Wound Cleansed Soap and water 03/19/23 0950   Dressing/Treatment Xeroform;ABD 03/19/23 1023   Offloading for Diabetic Foot Ulcers Offloading not required 03/19/23 1023   Wound Length (cm) 3.5 cm 03/19/23 0950    Wound Width (cm) 4.7 cm 03/19/23 0950   Wound Depth (cm) 0.1 cm 03/19/23 0950   Wound Surface Area (cm^2) 16.45 cm^2 03/19/23 0950   Change in Wound Size % (l*w) 84.88 03/19/23 0950   Wound Volume (cm^3) 1.645 cm^3 03/19/23 0950   Wound Healing % 85 03/19/23 0950   Wound Assessment Pink/red;Slough 03/19/23 0950   Drainage Amount Moderate (25-50%) 03/19/23 0950  Drainage Description Serosanguinous 03/19/23 0950   Odor None 03/19/23 0950   Peri-wound Assessment Hemosiderin staining (brown yellow) 03/19/23 0950   Margins Flat/open edges 03/19/23 0950   Wound Thickness Description not for Pressure Injury Full thickness 03/19/23 0950   Number of days: 31       Wound 03/19/23 Pretibial Right;Medial cluster (Active)   Wound Image   03/19/23 0950   Wound Etiology Venous 03/19/23 1023   Dressing Status New dressing applied 03/19/23 1023   Wound Cleansed Soap and water 03/19/23 0950   Dressing/Treatment Xeroform;ABD 03/19/23 1023   Offloading for Diabetic Foot Ulcers Offloading ordered 03/19/23 1023   Wound Length (cm) 14 cm 03/19/23 0950   Wound Width (cm) 9 cm 03/19/23 0950   Wound Depth (cm) 0.1 cm 03/19/23 0950   Wound Surface Area (cm^2) 126 cm^2 03/19/23 0950   Wound Volume (cm^3) 12.6 cm^3 03/19/23 0950   Wound Assessment Pink/red 03/19/23 0950   Drainage Amount Moderate (25-50%) 03/19/23 0950   Drainage Description Serosanguinous 03/19/23 0950   Odor None 03/19/23 0950   Peri-wound Assessment Blanchable erythema 03/19/23 0950   Margins Flat/open edges 03/19/23 0950   Wound Thickness Description not for Pressure Injury Partial thickness 03/19/23 0950   Number of days: 0          Procedures done during this encounter:   Debridement: Other: recommended but declined by patient.   Indications:  Based on my examination of this patient's wound(s)/ulcer(s) today, debridement is required to promote healing and evaluate the wound base. Risks and benefits discussed with patient who disagreed.     TIME: E/M Time spent with  patient and/or patient care issues: [x]  15-20 min  []  21-30 min  []  31-44 min  []  45 min or more.   This is above the usual time needed to address patient's chief complaint today: [x]  Yes  []  No  This time includes physician non-face-to-face service time visit on the date of service such as  Preparing to see the patient (eg, review of tests)  Obtaining and/or reviewing separately obtained history  Performing a medically necessary appropriate examination and/or evaluation  Counseling and educating the patient/family/caregiver  Ordering medications, tests, or procedures  Referring and communicating with other health care professionals as needed  Documenting clinical information in the electronic or other health record  Independently interpreting results (not reported separately) and communicating results to the patient/family/caregiver  Care coordination (not reported separately)    Objective:    BP (!) 160/68   Pulse 83   Temp 97.8 F (36.6 C) (Temporal)   Resp 18   Wt Readings from Last 3 Encounters:   01/28/23 99.3 kg (218 lb 14.4 oz)   12/19/22 104.1 kg (229 lb 8 oz)   12/16/22 108.4 kg (239 lb)       PHYSICAL EXAM  General: Alert and in no acute distress. Normal appearing. Elderly in NAD  Skin: Warm and dry, no rash  Head: Normocephalic and atraumatic  Eyes: Extraocular eye movements intact, conjunctivae normal, and sclera anicteric  ENT: Hearing grossly normal bilaterally. Normal appearance  Respiratory:  no respiratory distress  Musculoskeletal: Baseline range of motion in joints. Nontender calves. No cyanosis. Edema 3+on right ; +2 on the left.  Neurologic: Speech normal. At baseline without new focal deficits. Mental status - hallucinating. A&O x 3.   Bilateral lower leg wounds - see images and descriptions above    PAST MEDICAL HISTORY        Diagnosis Date  Arthritis     Asthma     Chronic renal disease, stage III [facility]) [id number] 10/13/2022    Depression     Diabetes ([place])     Fibromyalgia      Fibromyalgia     Gastroparesis     Hyperlipemia     Hypertension     Neuropathy     Psychotic disorder ([place])     Stroke ([place])        PAST SURGICAL HISTORY    Past Surgical History:   Procedure Laterality Date    COLONOSCOPY      HYSTERECTOMY (CERVIX STATUS UNKNOWN)         FAMILY HISTORY    Family History   Problem Relation Age of Onset    Breast Cancer Maternal Grandmother     Cancer Father         lung ca    Hypertension Father     Diabetes Mother     Hypertension Mother        SOCIAL HISTORY    Social History     Tobacco Use    Smoking status: Never     Passive exposure: Never    Smokeless tobacco: Never   Vaping Use    Vaping Use: Never used   Substance Use Topics    Alcohol use: No    Drug use: No       ALLERGIES    Allergies   Allergen Reactions    Statins Myalgia     Lovastatin, pravastatin, simvastatin, atorvastatin, zetia, livalo, lovaza    Adhesive Tape Rash    Ampicillin Rash    Gabapentin Nausea And Vomiting    Lidocaine Rash       MEDICATIONS    Current Outpatient Medications on File Prior to Encounter   Medication Sig Dispense Refill    linezolid (ZYVOX) 600 MG tablet Take 1 tablet by mouth 2 times daily Bid FOR 14 DAYS (Patient not taking: Reported on 03/05/2023)      pregabalin (LYRICA) 300 MG capsule Take 1 capsule by mouth 2 times daily for 180 days. Max Daily Amount: 600 mg 180 capsule 1    pregabalin (LYRICA) 150 MG capsule Take 1 capsule by mouth 3 times daily for 270 days. Max Daily Amount: 450 mg 270 capsule 2    arformoterol tartrate 15 MCG/2ML NEBU 15 mcg, budesonide 0.25 MG/2ML SUSP 250 mcg Take 1 Dose by nebulization in the morning and 1 Dose in the evening. (Patient not taking: Reported on 12/18/2022) 60 Units 11    albuterol sulfate HFA (VENTOLIN HFA) 108 (90 Base) MCG/ACT inhaler Inhale 2 puffs into the lungs 4 times daily as needed for Wheezing (Patient not taking: Reported on 12/18/2022) 54 g 1    albuterol (PROVENTIL) (2.5 MG/3ML) 0.083% nebulizer solution Take 3 mLs by nebulization 4  times daily 120 each 3    QUEtiapine (SEROQUEL) 25 MG tablet Take 0.5 tablets by mouth nightly (Patient not taking: Reported on 02/16/2023) 60 tablet 3    arformoterol tartrate (BROVANA) 15 MCG/2ML NEBU Take 1 ampule by nebulization 2 times daily (Patient not taking: Reported on 12/18/2022) 120 mL 3    budesonide (PULMICORT) 0.25 MG/2ML nebulizer suspension Take 2 mLs by nebulization 2 times daily (Patient not taking: Reported on 12/18/2022) 60 each 3    albuterol sulfate HFA (VENTOLIN HFA) 108 (90 Base) MCG/ACT inhaler Inhale 2 puffs into the lungs 4 times daily as needed for Wheezing 18 g 0    carvedilol (COREG)  12.5 MG tablet Take 1 tablet by mouth 2 times daily      Multiple Vitamins-Minerals (THERAPEUTIC MULTIVITAMIN-MINERALS) tablet Take 1 tablet by mouth daily      omeprazole (PRILOSEC) 40 MG delayed release capsule Take 1 capsule by mouth daily 90 capsule 3    aspirin 81 MG chewable tablet Take 1 tablet by mouth daily 30 tablet 11    vitamin D3 (CHOLECALCIFEROL) 125 MCG (5000 UT) TABS tablet Take 1 tablet by mouth daily 30 tablet 11    SITagliptin (JANUVIA) 100 MG tablet Take 1 tablet by mouth daily 90 tablet 3    quinapril (ACCUPRIL) 40 MG tablet quinapril 40 mg tablet   TAKE 1 TABLET BY MOUTH EVERY NIGHT FOR HIGH BLOOD PRESSURE (Patient not taking: Reported on 12/19/2022) 90 tablet 3    REPATHA SURECLICK [id number] MG/ML SOAJ INJECT AS DIRECTED EVERY 2 WEEKS 2 Adjustable Dose Pre-filled Pen Syringe 11    insulin lispro, 1 Unit Dial, (HUMALOG/ADMELOG) 100 UNIT/ML SOPN Humalog KwikPen (U-100) Insulin 100 unit/mL subcutaneous   INJECT UP TO 50 UNITS UNDER THE SKIN DAILY AS DIRECTED      ipratropium (ATROVENT HFA) 17 MCG/ACT inhaler Atrovent HFA 17 mcg/actuation aerosol inhaler (Patient not taking: Reported on 02/16/2023)      linaclotide (LINZESS) 290 MCG CAPS capsule Take 1 capsule by mouth as needed      olopatadine (PATANOL) 0.1 % ophthalmic solution Apply 2 drops to eye 2 times daily       No current  facility-administered medications on file prior to encounter.         Written patient dismissal instructions given to patient and signed by patient or POA.         Electronically signed by Trellis Paganini, MD on 03/19/2023 at 4:53 PM

## 2023-03-19 NOTE — Other (Signed)
03/19/23 0950   Anesthetic   Anesthetic None   Right Leg Edema Point of Measurement   Leg circumference 43 cm   Ankle circumference 23.5 cm   Compression Therapy Compression stockings   Left Leg Edema Point of Measurement   Leg circumference 41 cm   Ankle circumference 24.2 cm   Compression Therapy 2 layer compression wrap   Peripheral Vascular   RLE Edema +2;Non-pitting   LLE Edema +1;Non-pitting   RLE Neurovascular Assessment   Capillary Refill Less than/Equal to 3 seconds   Color Appropriate for Ethnicity   Temperature Warm   RLE Sensation  Full sensation   R Pedal Pulse +1   LLE Neurovascular Assessment   Capillary Refill Less than/Equal to 3 seconds   Color Appropriate for Ethnicity   Temperature Warm   LLE Sensation  Full sensation   L Pedal Pulse +1   Wound 03/19/23 Pretibial Right;Medial cluster   Date First Assessed/Time First Assessed: 03/19/23 0950   Present on Original Admission: Yes  Wound Approximate Age at First Assessment (Weeks): 1 weeks  Location: Pretibial  Wound Location Orientation: Right;Medial  Wound Description (Comments): cluster   Wound Image    Wound Etiology Venous   Dressing Status Other (Comment)  (ota)   Wound Cleansed Soap and water   Offloading for Diabetic Foot Ulcers Offloading not required   Wound Length (cm) 14 cm   Wound Width (cm) 9 cm   Wound Depth (cm) 0.1 cm   Wound Surface Area (cm^2) 126 cm^2   Wound Volume (cm^3) 12.6 cm^3   Wound Assessment Pink/red   Drainage Amount Moderate (25-50%)   Drainage Description Serosanguinous   Odor None   Peri-wound Assessment Blanchable erythema   Margins Flat/open edges   Wound Thickness Description not for Pressure Injury Partial thickness   Wound 02/16/23 Pretibial Distal;Left;Medial   Date First Assessed/Time First Assessed: 02/16/23 1007   Present on Original Admission: Yes  Wound Approximate Age at First Assessment (Weeks): 4 weeks  Location: Pretibial  Wound Location Orientation: Distal;Left;Medial   Wound Image    Wound Etiology  Venous   Dressing Status Intact;Old drainage noted   Wound Cleansed Soap and water   Offloading for Diabetic Foot Ulcers Offloading not required   Wound Length (cm) 3.5 cm   Wound Width (cm) 4.7 cm   Wound Depth (cm) 0.1 cm   Wound Surface Area (cm^2) 16.45 cm^2   Change in Wound Size % (l*w) 84.88   Wound Volume (cm^3) 1.645 cm^3   Wound Healing % 85   Wound Assessment Pink/red;Slough   Drainage Amount Moderate (25-50%)   Drainage Description Serosanguinous   Odor None   Peri-wound Assessment Hemosiderin staining (brown yellow)   Margins Flat/open edges   Wound Thickness Description not for Pressure Injury Full thickness     BP (!) 160/68   Pulse 83   Temp 97.8 F (36.6 C) (Temporal)   Resp 18

## 2023-03-19 NOTE — Patient Instructions (Signed)
Discharge Instructions for  [facility]  [place] [address]  Phone: [phone] Fax: [phone]    NAME:  Jamie Bryant  DATE OF BIRTH:  11/14/1941  MEDICAL RECORD NUMBER:  [id number]  DATE:  March 19, 2023    WOUND CARE ORDERS:  Bilateral lower leg wounds - Cleanse with baby shampoo. Rinse and pat dry. Apply xeroform to wound bed. Cover with gauze or abd pad. Secure with 2 layer with calamine for compression. Change three times a week. Home care to change between clinic visits.     Activity:  [x]  Elevate leg(s) above the level of the heart when sitting.  [x]  Avoid prolonged standing in one place.   [x]  Do no get dressing/wrap wet.       Dietary:  []  Diet as tolerated      [x]  Diabetic Diet            []  Increase Protein: examples (Meat, cheese, eggs, greek yogurt, fish, nuts)          []  Juven Therapeutic Nutrition Powder     Return Appointment:  [x]  Return Appointment: With Dr. Darrel Hoover in 2 weeks.   []  Nurse Visit :   []  Ordered tests:      Electronically signed by Jennette Banker, RN on 03/19/2023 at 10:06 AM     [place] Information: Should you experience any significant changes in your wound(s) or have questions about your wound care, please contact the [facility] [place] 8:00 am - 4:30.  If you need help with your wound outside these hours and cannot wait until we are again available, contact your PCP or go to the hospital emergency room.     PLEASE NOTE: IF YOU ARE UNABLE TO OBTAIN WOUND SUPPLIES, CONTINUE TO USE THE SUPPLIES YOU HAVE AVAILABLE UNTIL YOU ARE ABLE TO REACH [place]. IT IS MOST IMPORTANT TO KEEP THE WOUND COVERED AT ALL TIMES.     Physician Signature:_______________________    Date: ___________ Time:  ____________

## 2023-03-19 NOTE — Other (Signed)
03/19/23 1023   Right Leg Edema Point of Measurement   Compression Therapy 2 layer compression wrap   Left Leg Edema Point of Measurement   Compression Therapy 2 layer compression wrap   Wound 03/19/23 Pretibial Right;Medial cluster   Date First Assessed/Time First Assessed: 03/19/23 0950   Present on Original Admission: Yes  Wound Approximate Age at First Assessment (Weeks): 1 weeks  Location: Pretibial  Wound Location Orientation: Right;Medial  Wound Description (Comments): cluster   Wound Etiology Venous   Dressing Status New dressing applied   Dressing/Treatment Xeroform;ABD   Offloading for Diabetic Foot Ulcers Offloading ordered   Wound 02/16/23 Pretibial Distal;Left;Medial   Date First Assessed/Time First Assessed: 02/16/23 1007   Present on Original Admission: Yes  Wound Approximate Age at First Assessment (Weeks): 4 weeks  Location: Pretibial  Wound Location Orientation: Distal;Left;Medial   Wound Etiology Venous   Dressing Status New dressing applied   Dressing/Treatment Xeroform;ABD   Offloading for Diabetic Foot Ulcers Offloading not required     Discharge Condition: Stable    Pain: 9    Ambulatory Status: Rolling Walker    Discharge Destination: home    Transportation:car    Accompanied by: SELF    Discharge instructions reviewed with SELF and copy or written instructions have been provided. All questions/concerns have been addressed at this time.

## 2023-03-25 ENCOUNTER — Inpatient Hospital Stay
Admission: EM | Admit: 2023-03-25 | Discharge: 2023-04-01 | Disposition: A | Discharge status: Skilled Nursing Facility | Payer: Medicare Other | Admitting: Internal Medicine

## 2023-03-25 DIAGNOSIS — E662 Morbid (severe) obesity with alveolar hypoventilation: Secondary | ICD-10-CM

## 2023-03-25 DIAGNOSIS — J9621 Acute and chronic respiratory failure with hypoxia: Principal | ICD-10-CM

## 2023-03-25 LAB — ARTERIAL BLOOD GAS, POC
Base Excess: 1.8 mmol/L
FIO2: 50 %
POC HCO3: 28.3 MMOL/L — ABNORMAL HIGH (ref 22–26)
POC O2 SAT: 98.1 % — ABNORMAL HIGH (ref 92–97)
POC PEEP: 6 cmH2O
POC PO2: 114 MMHG — ABNORMAL HIGH (ref 80–100)
POC Pressure Support: 15 cmH2O
POC pCO2: 52.5 MMHG — ABNORMAL HIGH (ref 35.0–45.0)
POC pH: 7.34 — ABNORMAL LOW (ref 7.35–7.45)

## 2023-03-25 LAB — EXTRA TUBES HOLD

## 2023-03-25 LAB — COMPREHENSIVE METABOLIC PANEL
ALT: 14 U/L (ref 12–78)
AST: 14 U/L — ABNORMAL LOW (ref 15–37)
Albumin/Globulin Ratio: 0.8 — ABNORMAL LOW (ref 1.1–2.2)
Albumin: 3.4 g/dL — ABNORMAL LOW (ref 3.5–5.0)
Alk Phosphatase: 115 U/L (ref 45–117)
Anion Gap: 3 mmol/L — ABNORMAL LOW (ref 5–15)
BUN/Creatinine Ratio: 14 (ref 12–20)
BUN: 13 MG/DL (ref 6–20)
CO2: 30 mmol/L (ref 21–32)
Calcium: 9.7 MG/DL (ref 8.5–10.1)
Chloride: 104 mmol/L (ref 97–108)
Creatinine: 0.95 MG/DL (ref 0.55–1.02)
Est, Glom Filt Rate: 61 mL/min/{1.73_m2} (ref >60)
Globulin: 4.5 g/dL — ABNORMAL HIGH (ref 2.0–4.0)
Glucose: 262 mg/dL — ABNORMAL HIGH (ref 65–100)
Potassium: 4.2 mmol/L (ref 3.5–5.1)
Sodium: 137 mmol/L (ref 136–145)
Total Bilirubin: 0.3 MG/DL (ref 0.2–1.0)
Total Protein: 7.9 g/dL (ref 6.4–8.2)

## 2023-03-25 LAB — CBC WITH AUTO DIFFERENTIAL
Basophils %: 0 % (ref 0–1)
Basophils Absolute: 0 10*3/uL (ref 0.0–0.1)
Eosinophils %: 1 % (ref 0–7)
Eosinophils Absolute: 0.3 10*3/uL (ref 0.0–0.4)
Hematocrit: 30.5 % — ABNORMAL LOW (ref 35.0–47.0)
Hemoglobin: 9.2 g/dL — ABNORMAL LOW (ref 11.5–16.0)
Immature Granulocytes %: 1 % — ABNORMAL HIGH (ref 0.0–0.5)
Immature Granulocytes Absolute: 0.3 10*3/uL — ABNORMAL HIGH (ref 0.00–0.04)
Lymphocytes %: 7 % — ABNORMAL LOW (ref 12–49)
Lymphocytes Absolute: 1.8 10*3/uL (ref 0.8–3.5)
MCH: 24.7 PG — ABNORMAL LOW (ref 26.0–34.0)
MCHC: 30.2 g/dL (ref 30.0–36.5)
MCV: 81.8 FL (ref 80.0–99.0)
MPV: 11.6 FL (ref 8.9–12.9)
Monocytes %: 3 % — ABNORMAL LOW (ref 5–13)
Monocytes Absolute: 0.8 10*3/uL (ref 0.0–1.0)
Neutrophils %: 88 % — ABNORMAL HIGH (ref 32–75)
Neutrophils Absolute: 22.4 10*3/uL — ABNORMAL HIGH (ref 1.8–8.0)
Nucleated RBCs: 0 PER 100 WBC
Platelets: 395 10*3/uL (ref 150–400)
RBC: 3.73 M/uL — ABNORMAL LOW (ref 3.80–5.20)
RDW: 17.3 % — ABNORMAL HIGH (ref 11.5–14.5)
WBC: 25.6 10*3/uL — ABNORMAL HIGH (ref 3.6–11.0)
nRBC: 0 10*3/uL (ref 0.00–0.01)

## 2023-03-25 LAB — TROPONIN: Troponin, High Sensitivity: 11 ng/L (ref 0–51)

## 2023-03-25 LAB — BRAIN NATRIURETIC PEPTIDE: NT Pro-BNP: 556 PG/ML — ABNORMAL HIGH (ref <450)

## 2023-03-25 LAB — MAGNESIUM: Magnesium: 2.1 mg/dL (ref 1.6–2.4)

## 2023-03-25 MED ORDER — NORMAL SALINE FLUSH 0.9 % IV SOLN
0.9 | INTRAVENOUS | Status: DC | PRN
Start: 2023-03-25 — End: 2023-04-01

## 2023-03-25 MED ORDER — NORMAL SALINE FLUSH 0.9 % IV SOLN
0.9 | INTRAVENOUS | Status: DC
Start: 2023-03-25 — End: 2023-04-01
  Administered 2023-03-26 – 2023-03-31 (×8): 10 mL via INTRAVENOUS
  Administered 2023-04-01: 13:00:00 5 mL via INTRAVENOUS

## 2023-03-25 MED ORDER — VANCOMYCIN INTERMITTENT DOSING (PLACEHOLDER)
INTRAVENOUS | Status: DC
Start: 2023-03-25 — End: 2023-03-27

## 2023-03-25 MED ORDER — ENOXAPARIN SODIUM 30 MG/0.3ML IJ SOSY
30 | INTRAMUSCULAR | Status: DC
Start: 2023-03-25 — End: 2023-03-31
  Administered 2023-03-26 – 2023-03-31 (×11): 30 mg via SUBCUTANEOUS

## 2023-03-25 MED ORDER — CEFEPIME HCL 2 G IV SOLR
2 | INTRAVENOUS | Status: AC
Start: 2023-03-25 — End: 2023-03-25
  Administered 2023-03-26: 01:00:00 2000 mg via INTRAVENOUS

## 2023-03-25 MED ORDER — ACETAMINOPHEN 325 MG PO TABS
325 | Freq: Four times a day (QID) | ORAL | Status: DC | PRN
Start: 2023-03-25 — End: 2023-04-01
  Administered 2023-03-26 – 2023-03-29 (×4): 650 mg via ORAL

## 2023-03-25 MED ORDER — SODIUM CHLORIDE 0.9 % IV SOLN
0.9 | INTRAVENOUS | Status: DC | PRN
Start: 2023-03-25 — End: 2023-04-01

## 2023-03-25 MED ORDER — ACETAMINOPHEN 650 MG RE SUPP
650 | Freq: Four times a day (QID) | RECTAL | Status: DC | PRN
Start: 2023-03-25 — End: 2023-04-01

## 2023-03-25 MED ORDER — NORMAL SALINE FLUSH 0.9 % IV SOLN
0.9 | INTRAVENOUS | Status: DC
Start: 2023-03-25 — End: 2023-04-01
  Administered 2023-03-27 – 2023-03-31 (×8): 10 mL via INTRAVENOUS
  Administered 2023-04-01: 13:00:00 5 mL via INTRAVENOUS

## 2023-03-25 MED ORDER — CEFEPIME HCL 2 G IV SOLR
2 | INTRAVENOUS | Status: DC
Start: 2023-03-25 — End: 2023-03-27
  Administered 2023-03-26 – 2023-03-27 (×5): 2000 mg via INTRAVENOUS

## 2023-03-25 MED ORDER — VIAL2BAG ADAPTOR (20 MM)
1.5 | INTRAVENOUS | Status: DC
Start: 2023-03-25 — End: 2023-03-25

## 2023-03-25 MED FILL — MONOJECT FLUSH SYRINGE 0.9 % IV SOLN: 0.9 % | INTRAVENOUS | Qty: 40

## 2023-03-25 MED FILL — LOVENOX 30 MG/0.3ML IJ SOSY: 30 MG/0.3ML | INTRAMUSCULAR | Qty: 0.3

## 2023-03-25 NOTE — ED Provider Notes (Signed)
[facility] EMERGENCY DEP  EMERGENCY DEPARTMENT ENCOUNTER      Pt Name: Jamie Bryant  MRN: [id number]  [place] August 30, 1941  Date of evaluation: 03/25/2023  Provider: Margret Chance, MD    CHIEF COMPLAINT       Chief Complaint   Patient presents with    Shortness of Breath         HISTORY OF PRESENT ILLNESS    Jamie Bryant is a 82 yo F with h/o DM, HTN and venous insufficiency with chronic BLE edema.  She developed shortness of breath this morning.  She called 911.  When first responder arrived he gave dexamethasone.  When EMS arrived she had audible rales and was started on CPAP with PEEP 10 and given nitropaste and 80mg  Lasix IV.  Patient reports she normally takes 40mg  lasix daily.  PAtient indicates her breathing is feeling better after staring on CPAP.           Additional history from independent historians:     Review of External Medical Records:     Nursing Notes were reviewed.    REVIEW OF SYSTEMS       Review of Systems   Constitutional:  Negative for fever.   HENT:  Negative for sore throat.    Eyes:  Negative for visual disturbance.   Respiratory:  Positive for shortness of breath. Negative for cough.    Cardiovascular:  Positive for leg swelling. Negative for chest pain.   Gastrointestinal:  Negative for abdominal pain.   Genitourinary:  Negative for dysuria.   Musculoskeletal:  Negative for back pain.   Skin:  Negative for rash.   Neurological:  Negative for headaches.       Except as noted above the remainder of the review of systems was reviewed and negative.       PAST MEDICAL HISTORY   No past medical history on file.      SURGICAL HISTORY     No past surgical history on file.      CURRENT MEDICATIONS       Previous Medications    No medications on file       ALLERGIES     Patient has no allergy information on record.    FAMILY HISTORY     No family history on file.       SOCIAL HISTORY            PHYSICAL EXAM       ED Triage Vitals   BP Temp Temp Source Pulse Respirations SpO2 Height Weight -  Scale   03/25/23 1710 03/25/23 1710 03/25/23 1710 03/25/23 1657 03/25/23 1657 03/25/23 1657 03/25/23 1710 03/25/23 1710   (!) 141/59 99.7 F (37.6 C) Oral 99 28 97 % 1.626 m (5\' 4" ) 104 kg (229 lb 4.5 oz)          Body mass index is 39.36 kg/m.    Physical Exam  Vitals and nursing note reviewed.   Constitutional:       General: She is not in acute distress.     Appearance: She is obese.   HENT:      Head: Normocephalic and atraumatic.      Mouth/Throat:      Mouth: Mucous membranes are moist.   Eyes:      Extraocular Movements: Extraocular movements intact.      Conjunctiva/sclera: Conjunctivae normal.   Cardiovascular:      Rate and Rhythm: Normal rate.   Pulmonary:  Effort: Pulmonary effort is normal. No respiratory distress.      Breath sounds: Rales present.   Abdominal:      General: There is no distension.   Musculoskeletal:         General: No deformity.      Cervical back: Normal range of motion.      Right lower leg: Edema present.      Left lower leg: Edema present.      Comments: BLE in lymphedema compression wraps   Skin:     General: Skin is warm and dry.   Neurological:      General: No focal deficit present.      Mental Status: She is alert.         DIAGNOSTIC RESULTS     EKG: All EKG's are interpreted by the Emergency Department Physician listed in the interpretation in the absence of a cardiologist and may also be found below under Reassessment/ED Course.    ED EKG Interpretation:  Time: 5:05 PM  Rhythm: normal sinus rhythm; and regular . Rate (approx.): 93; Axis: normal; P wave: normal; QRS interval: normal ; ST/T wave: normal; Other findings: Normal.  EKG interpreted by Margret Chance, MD.        RADIOLOGY:   Non-plain film images such as CT, Ultrasound and MRI are read by the radiologist. Plain radiographic images are visualized and preliminarily interpreted by the emergency physician with the below findings:    Interpretation per the Radiologist below, if available at the time of this  note:    XR CHEST PORTABLE   Final Result      Hazy opacification of both lungs, right worse than left, likely pulmonary edema.              LABS:  Labs Reviewed   CBC WITH AUTO DIFFERENTIAL - Abnormal; Notable for the following components:       Result Value    WBC 25.6 (*)     RBC 3.73 (*)     Hemoglobin 9.2 (*)     Hematocrit 30.5 (*)     MCH 24.7 (*)     RDW 17.3 (*)     All other components within normal limits   COMPREHENSIVE METABOLIC PANEL - Abnormal; Notable for the following components:    Anion Gap 3 (*)     Glucose 262 (*)     AST 14 (*)     Albumin 3.4 (*)     Globulin 4.5 (*)     Albumin/Globulin Ratio 0.8 (*)     All other components within normal limits   BRAIN NATRIURETIC PEPTIDE - Abnormal; Notable for the following components:    NT Pro-BNP 556 (*)     All other components within normal limits   ARTERIAL BLOOD GAS, POC - Abnormal; Notable for the following components:    POC pH 7.34 (*)     POC pCO2 52.5 (*)     POC PO2 114 (*)     POC HCO3 28.3 (*)     POC O2 SAT 98.1 (*)     All other components within normal limits   MAGNESIUM   TROPONIN   EXTRA TUBES HOLD   BLOOD GAS, ARTERIAL       All other labs were within normal range or not returned as of this dictation.    EMERGENCY DEPARTMENT COURSE and DIFFERENTIAL DIAGNOSIS/MDM:   Vitals:    Vitals:    03/25/23 1710 03/25/23 1730 03/25/23 1745 03/25/23  1800   BP: (!) 141/59 (!) 148/65 (!) 141/52 122/74   Pulse: 95 86 88 88   Resp: 24 20 22 22    Temp: 99.7 F (37.6 C)      TempSrc: Oral      SpO2: 98% 97% 96% 96%   Weight: 104 kg (229 lb 4.5 oz)      Height: 1.626 m (5\' 4" )              Medical Decision Making  Amount and/or Complexity of Data Reviewed  Labs: ordered.  Radiology: ordered.  ECG/medicine tests: ordered.    Risk  Decision regarding hospitalization.            REASSESSMENT          CRITICAL CARE TIME   Total Critical Care time was 35 minutes, excluding separately reportable procedures.     CONSULTS:  None    PROCEDURES:  Unless otherwise  noted below, none     Procedures        FINAL IMPRESSION      1. Acute pulmonary edema (HCC)    2. Leukocytosis, unspecified type          DISPOSITION/PLAN   DISPOSITION      Perfect Serve Consult for Admission  6:38 PM    ED Room Number: ER14/14  Patient Name and age:  Jamie Bryant 82 y.o.  female  Working Diagnosis:   1. Acute pulmonary edema (HCC)    2. Leukocytosis, unspecified type        COVID-19 Suspicion: No  Sepsis present:  No  Reassessment needed: No  Code Status:  Full Code  Readmission: No  Isolation Requirements: no  Recommended Level of Care: step down  Department: [facility] Adult ED - [phone]  Consulting Provider: Verda Cumins    Other:  h/o DM, HTN and venous insufficiency.  Arrived by EMS with respiratory distress on CPAP.   Prior to arrival in the ED had received dexamethasone, Nitropaste and 80mg  Lasix IV.  Placed in BiPAP upon arrival.  Trop 11, BNP 556, WBC 25.6  CXR with Pulmonary edema R>L.          PATIENT REFERRED TO:  No follow-up provider specified.    DISCHARGE MEDICATIONS:  New Prescriptions    No medications on file         (Please note that portions of this note were completed with a voice recognition program.  Efforts were made to edit the dictations but occasionally words are mis-transcribed.)    Margret Chance, MD (electronically signed)  Emergency Attending Physician            Sharlyne Cai, MD  03/25/23 [phone]

## 2023-03-25 NOTE — ED Triage Notes (Signed)
Patient from home presented with SOB on CPAP. EMS gave 80 mg Furosemide and dexamethasone, and nitro paste on chest.. EMS states patient was inially in 60s O2 sats, then placed on cpap. RT and MD bedside and patient placed on Cpap.

## 2023-03-25 NOTE — H&P (Signed)
History and Physical    Date of Service:  03/25/2023  Primary Care Provider: No primary care provider on file.  Source of information: Patient is a limited story.  Majority of history is obtained per review ED and electronic medical records.    Chief Complaint: Patient does not provide      History of Presenting Illness:   Jamie Bryant is a 82 y.o. female with past medical history of type 2 diabetes mellitus, hypertension, hyperlipidemia, chronic venous sufficiency, chronic bilateral lower extremity edema, chronic venous stasis lower extremity ulcers, arthritis, asthma, CKD, depression, fibromyalgia, gastroparesis, neuropathy, stroke, psychotic disorder unspecified, obesity presented to the emergency department via EMS from home with chief complaint of shortness of breath.  Patient is a limited historian.  Majority of history is obtained per review of ED electronic medical records.  Per the ER triage note, patient was noted short of breath.  EMS was dispatched to the scene and gave patient Furosemide 80 mg IV x 1 dose, Dexamethasone, and Nitroglycerin (paste) on chest.  Initial O2 saturations were in the 60s.  Patient reportedly was placed on CPAP.  On arrival to the ED, initial recorded vital signs were temperature 99.7 F, BP 141/59, heart rate 99, respiratory rate 28, O2 saturation 97% on CPAP FiO2 50%.  12-lead EKG showed normal sinus rhythm at 93 bpm.  Chest reportable showed hazy opacification of both lungs right greater than left, likely pulmonary edema.  ABG at 1743 hrs.: pH 7.34, pCO2 52.5, pO2 114, HCO3 28.3, base excess 1.8, SaO2 98.1% on FiO2 50% IPAP 15/ EPAP 6.  Abnormal labs included WBC 25.6, neutrophils 88%, hemoglobin 9.2, blood glucose 262, proBNP 556, albumin 3.4.  ED then consulted hospitalist.  On arrival to bedside, patient is on BiPAP.  She is mostly mumbling with mostly incomprehensible speech and not answering questions appropriately.       REVIEW OF SYSTEMS:  A comprehensive  review of systems was negative except for that written in the History of Present Illness.     PAST MEDICAL HISTORY:   Arthritis      Asthma      Chronic renal disease, stage III [facility]) [id number] 10/13/2022    Depression      Diabetes ([place])      Fibromyalgia      Fibromyalgia      Gastroparesis      Hyperlipemia      Hypertension      Neuropathy      Psychotic disorder ([place])      Stroke (HCC)      Medications:  Prior to Admission medications    Not on File   Patient's chart was not merged at the time of evaluation but per further chart review was able to obtain the following list of prior medications:   ciprofloxacin (CIPRO) 250 MG tablet Take 1 tablet by mouth 2 times daily for 10 days 20 tablet 0    linezolid (ZYVOX) 600 MG tablet Take 1 tablet by mouth 2 times daily for 14 days 28 tablet 0    albuterol (PROVENTIL) (2.5 MG/3ML) 0.083% nebulizer solution Take 3 mLs by nebulization 4 times daily 120 each 3    QUEtiapine (SEROQUEL) 25 MG tablet Take 0.5 tablets by mouth nightly 60 tablet 3    albuterol sulfate HFA (VENTOLIN HFA) 108 (90 Base) MCG/ACT inhaler Inhale 2 puffs into the lungs 4 times daily as needed for Wheezing 18 g 0    carvedilol (COREG) 12.5 MG tablet Take 1  tablet by mouth 2 times daily        Multiple Vitamins-Minerals (THERAPEUTIC MULTIVITAMIN-MINERALS) tablet Take 1 tablet by mouth daily        omeprazole (PRILOSEC) 40 MG delayed release capsule Take 1 capsule by mouth daily 90 capsule 3    aspirin 81 MG chewable tablet Take 1 tablet by mouth daily 30 tablet 11    vitamin D3 (CHOLECALCIFEROL) 125 MCG (5000 UT) TABS tablet Take 1 tablet by mouth daily 30 tablet 11    SITagliptin (JANUVIA) 100 MG tablet Take 1 tablet by mouth daily 90 tablet 3    REPATHA SURECLICK [id number] MG/ML SOAJ INJECT AS DIRECTED EVERY 2 WEEKS 2 Adjustable Dose Pre-filled Pen Syringe 11    insulin lispro, 1 Unit Dial, (HUMALOG/ADMELOG) 100 UNIT/ML SOPN Humalog KwikPen (U-100) Insulin 100 unit/mL subcutaneous   INJECT UP TO 50 UNITS  UNDER THE SKIN DAILY AS DIRECTED        ipratropium (ATROVENT HFA) 17 MCG/ACT inhaler Atrovent HFA 17 mcg/actuation aerosol inhaler        linaclotide (LINZESS) 290 MCG CAPS capsule Take 1 capsule by mouth as needed        olopatadine (PATANOL) 0.1 % ophthalmic solution Apply 2 drops to eye 2 times daily        pregabalin (LYRICA) 150 MG capsule Take 1 capsule by mouth 3 times daily for 270 days. Max Daily Amount: 450 mg 270 capsule 2    arformoterol tartrate 15 MCG/2ML NEBU 15 mcg, budesonide 0.25 MG/2ML SUSP 250 mcg Take 1 Dose by nebulization in the morning and 1 Dose in the evening. (Patient not taking: Reported on 12/18/2022) 60 Units 11    albuterol sulfate HFA (VENTOLIN HFA) 108 (90 Base) MCG/ACT inhaler Inhale 2 puffs into the lungs 4 times daily as needed for Wheezing (Patient not taking: Reported on 12/18/2022) 54 g 1    arformoterol tartrate (BROVANA) 15 MCG/2ML NEBU Take 1 ampule by nebulization 2 times daily (Patient not taking: Reported on 12/18/2022) 120 mL 3    budesonide (PULMICORT) 0.25 MG/2ML nebulizer suspension Take 2 mLs by nebulization 2 times daily (Patient not taking: Reported on 12/18/2022) 60 each 3    quinapril (ACCUPRIL) 40 MG tablet quinapril 40 mg tablet   TAKE 1 TABLET BY MOUTH EVERY NIGHT FOR HIGH BLOOD PRESSURE (Patient not taking: Reported on 12/19/2022)       Allergies     Social History:     Social Determinants of Health     Tobacco Use: Not on file   Alcohol Use: Not on file   Financial Resource Strain: Not on file   Food Insecurity: Not on file   Transportation Needs: Not on file   Physical Activity: Not on file   Stress: Not on file   Social Connections: Not on file   Intimate Partner Violence: Not on file   Depression: Not on file   Housing Stability: Not on file   Interpersonal Safety: Not on file   Utilities: Not on file        Medications were reconciled to the best of my ability given all available resources at the time of admission. Route is PO if not otherwise noted.      Family and social history were personally reviewed, all pertinent and relevant details are outlined as above.    Objective:   BP (!)  161/60  Pulse 82  Temp 99.7 F (37.6 C) (Oral)   Resp 15   Ht 1.626 m (5'  4")   Wt 104 kg (229 lb 4.5 oz)   SpO2 96%  BMI 39.36 kg/m         BiPAP settings: IPAP 15/EPAP 6/rate 10/FiO2 40%    PHYSICAL EXAM:   General: Patient in no acute respiratory distress.  Appears weak and barely able to hold her head upright.  HEENT: Normocephalic, atraumatic, no otorrhea, no rhinorrhea, nares patent.  Pupils 2 mm reactive bilateral.  Moist mucus membranes.  Tongue midline/nonedematous.  Neck: Supple, no JVD, no meningeal signs.  No carotid bruits.  Trachea midline.  Respiratory/ Chest: Rales to auscultation bilaterally   CVS: RRR, S1 S2 heard, no murmurs/rubs/gallops  GI/ Abd: Soft.  Nontender.  Nondistended.  No rebound, guarding, or rigidity.  Normoactive bowel sounds.  No auscultated abdominal bruits or palpable abdominal mass.  Musculoskeletal/Ext: Nonpitting edema bilateral lower extremities.  No acute bony/soft tissue deformity.   Neuro: GCS 11 (E3 V3 M5).  Minimally moves all extremities x 4 with strength ~ 2/5 BUE/ BLE.  Speech mostly incomprehensible.  Mumbles.  Speaks with a whisper. No slurred speech.  No facial droop.  Sensation grossly decreased on plantar surface of both feet.     Psych: Obtunded.  Arouses to loud verbal stimuli but then falls back to sleep.  Speech is barely audible but oriented to first name only.  Otherwise not answering questions appropriately.  Cap refill: Brisk, less than 3 seconds  Vascular/ Pulses: 2+ radial, 1+ dorsalis pedis pulse bilateral.  Integument/ Skin: Warm, dry.  Unna boot compression dressings in place on both legs (as such leg wounds were not visualized)    Data Review:   I have independently reviewed and interpreted patient's lab and all other diagnostic data    Abnormal Labs Reviewed   CBC WITH AUTO DIFFERENTIAL - Abnormal;  Notable for the following components:       Result Value    WBC 25.6 (*)     RBC 3.73 (*)     Hemoglobin 9.2 (*)     Hematocrit 30.5 (*)     MCH 24.7 (*)     RDW 17.3 (*)     Neutrophils % 88 (*)     Lymphocytes % 7 (*)     Monocytes % 3 (*)     Immature Granulocytes 1 (*)     Neutrophils Absolute 22.4 (*)     Absolute Immature Granulocyte 0.3 (*)     All other components within normal limits   COMPREHENSIVE METABOLIC PANEL - Abnormal; Notable for the following components:    Anion Gap 3 (*)     Glucose 262 (*)     AST 14 (*)     Albumin 3.4 (*)     Globulin 4.5 (*)     Albumin/Globulin Ratio 0.8 (*)     All other components within normal limits   BRAIN NATRIURETIC PEPTIDE - Abnormal; Notable for the following components:    NT Pro-BNP 556 (*)     All other components within normal limits   ARTERIAL BLOOD GAS, POC - Abnormal; Notable for the following components:    POC pH 7.34 (*)     POC pCO2 52.5 (*)     POC PO2 114 (*)     POC HCO3 28.3 (*)     POC O2 SAT 98.1 (*)     All other components within normal limits       @MICRORESULTS @    IMAGING:   XR CHEST PORTABLE   Final  Result      Hazy opacification of both lungs, right worse than left, likely pulmonary edema.              ECG/ECHO:  @LASTCARDIOLOGY @       Notes reviewed from all clinical/nonclinical/nursing services involved in patient's clinical care. Care coordination discussions were held with appropriate clinical/nonclinical/ nursing providers based on care coordination needs.     Assessment:   Given the patient's current clinical presentation, there is a high level of concern for decompensation if discharged from the emergency department. Complex decision making was performed, which includes reviewing the patient's available past medical records, laboratory results, and imaging studies.    Principal Problem:    Acute respiratory failure with hypoxia (HCC)  Resolved Problems:    * No resolved hospital problems. *      Plan:     1.  Acute respiratory failure  with hypoxia and hypercapnia  -Clinical status is guarded to critical  -Admit to IMCU  -If patient status worsens and not improved, consider for transfer to ICU.   -Currently on BiPAP  -Last ABG shows uncompensated acute primary respiratory acidosis  -Repeat ABG until corrected /compensated  -Continuous pulse oximetry monitoring  -Consult pulmonologist    2.  Acute pulmonary edema  -Reportedly given Lasix 40 mg IV x 1 dose prior to admission per EMS  -Order to echocardiogram is still concern for underlying CHF.  proBNP level is not significantly elevated to diagnosis same.  -Place on strict I's and O's and daily weights    3.  Sepsis unspecified organism unspecified whether acute organ dysfunction  -Initial clinical indicators: WBC 25.6, RR 28  -No sepsis 30 ml/kg IVF bolus ordered due to concern for pulmonary edema  -Order empiric IV antibiotic coverage:  Cefepime 2000 mg IV every 8 hours  Vancomycin pharmacy dosing  -Check lactic acid level    4.  AMS (altered mental status)  -Uncertain of patient's baseline mental status.  Past PCP chart record notes possible dementia.  -Current exam significant for hypersomnolent/obtunded state  -As currently on BiPAP due to respiratory failure, defer on transport for CT head for now   -Place on neuro checks and fall precautions  -Consider for neurologist consultation    5.  Uncontrolled type 2 diabetes mellitus with hyperglycemia  -Order insulin correctional sliding scale coverage  -Order scheduled point-of-care blood glucose monitoring  -Order hemoglobin A1c level   -Order basal insulin coverage: Lantus    6.  Normocytic normochromic anemia  -Hgb = 9.2  -repeat H & H  -transfuse if Hgb < 7.0  -check iron profile, [id number], and folic acid   -order fecal occult blood test    7. Chronic venous stasis ulcers of both lower extremities  -Followed by wound care clinic with diagnosis of idiopathic chronic venous hypertension of right lower extremity with ulcer,venous stasis ulcers of left  calf, and nonpressure ulcer of right calf  -Currently has Unna boots in place.  Due to nature of dressings will consult with wound care team for wound checks and further evaluation and application of Unna boot compression dressings    8.  Generalized weakness  -Plan as above  -Consider for PT/OT evaluation    9.  Essential hypertension  -Monitor BP  -Provide BP medications as needed    DIET: Diet NPO Exceptions are: Sips of Water with Meds   ISOLATION PRECAUTIONS: No active isolations  CODE STATUS: Full Code   Central Line:   none  DVT PROPHYLAXIS: Lovenox  FUNCTIONAL STATUS PRIOR TO HOSPITALIZATION: uncertain baseline ambulation status   EARLY MOBILITY ASSESSMENT: Recommend routine ambulation while hospitalized with the assistance of nursing staff and Recommend an assessment from physical therapy and/or occupational therapy  ANTICIPATED DISCHARGE: Greater than 48 hours.  ANTICIPATED DISPOSITION: SNF  EMERGENCY CONTACT/SURROGATE DECISION MAKER:   [place] ([place])   [phone] ([place])     CRITICAL CARE WAS PERFORMED FOR THIS ENCOUNTER: YES. I had a face to face encounter with the patient, reviewed and interpreted patient data including clinical events, labs, images, vital signs, I/O's, and examined patient.  I have discussed the case and the plan and management of the patient's care with the consulting services, the bedside nurses and necessary ancillary providers.  This patient has a high probability of imminent, clinically significant deterioration, which requires the highest level of preparedness to intervene urgently. I participated in the decision-making and personally managed or directed the management of the following life and organ supporting interventions that required my frequent assessment to treat or prevent imminent deterioration.  I personally spent 60 minutes of critical care time.  This is time spent at this critically ill patient's bedside actively involved in patient care as well as the  coordination of care and discussions with the patient's family.  This does not include any procedural time which has been billed separately.      Signed By: Ruta Hinds, MD     March 25, 2023         Please note that this dictation may have been completed with Dragon, the computer voice recognition software.  Quite often unanticipated grammatical, syntax, homophones, and other interpretive errors are inadvertently transcribed by the computer software.  Please disregard these errors.  Please excuse any errors that have escaped final proofreading.      Addendum:  Later was able to speak with patient's son via telephone.  Per this discussion, it was suggested the patient's been having paranoia.  According to her son, the patient "thinks somebody is coming after her ".  She has some dementia which had been noted in prior records.  Continue with neurochecks and plan as previously noted.  He notes the patient's advance directive is FULL CODE.

## 2023-03-25 NOTE — ED Notes (Addendum)
Bladder scanned pt. Retaining 433mL. Straight cath per MD order    472mL obtained from sttraight cath

## 2023-03-25 NOTE — Progress Notes (Signed)
Pharmacist Note - Vancomycin Dosing    Consult provided for this 82 y.o. female for indication of Sepsis.  Antibiotic regimen(s): Vancomycin and cefepime  Patient on vancomycin PTA? NO     Recent Labs     03/25/23  1702   WBC 25.6*   CREATININE 0.95   BUN 13     Frequency of BMP: Daily x 3   Height: 162.6 cm  Weight: 104 kg  Est CrCl: 56 ml/min; UO: --- ml/kg/hr  Temp (24hrs), Avg:99.7 F (37.6 C), Min:99.7 F (37.6 C), Max:99.7 F (37.6 C)    Cultures:  None thus far    MRSA Swab ordered (if applicable)? YES    The plan below is expected to result in a target range of AUC/MIC 400-600    Therapy will be initiated with a loading dose of 2500 mg IV x 1 to be followed by a maintenance dose of 1250 mg IV every 24 hours.  Pharmacy to follow patient daily and order levels / make dose adjustments as appropriate.      Kirke Corin, PharmD  Clinical Pharmacist, [facility] [place] [phone])      *Vancomycin has been dosed used Bayesian kinetics software to target an AUC/MIC of 400-600, which provides adequate exposure for an assumed infection due to MRSA with an MIC of 1 or less while reducing the risk of nephrotoxicity as seen with traditional trough based dosing goals.

## 2023-03-26 LAB — BASIC METABOLIC PANEL
Anion Gap: 5 mmol/L (ref 5–15)
BUN/Creatinine Ratio: 20 (ref 12–20)
BUN: 14 MG/DL (ref 6–20)
CO2: 29 mmol/L (ref 21–32)
Calcium: 8.9 MG/DL (ref 8.5–10.1)
Chloride: 106 mmol/L (ref 97–108)
Creatinine: 0.69 MG/DL (ref 0.55–1.02)
Est, Glom Filt Rate: 88 mL/min/{1.73_m2} (ref >60)
Glucose: 170 mg/dL — ABNORMAL HIGH (ref 65–100)
Potassium: 4.7 mmol/L (ref 3.5–5.1)
Sodium: 140 mmol/L (ref 136–145)

## 2023-03-26 LAB — RESPIRATORY PANEL, MOLECULAR, WITH COVID-19
Adenovirus by PCR: NOT DETECTED
Bordetella parapertussis by PCR: NOT DETECTED
Bordetella pertussis by PCR: NOT DETECTED
Chlamydophila Pneumonia PCR: NOT DETECTED
Coronavirus 229E by PCR: NOT DETECTED
Coronavirus HKU1 by PCR: NOT DETECTED
Coronavirus NL63 by PCR: NOT DETECTED
Coronavirus OC43 by PCR: NOT DETECTED
Human Metapneumovirus by PCR: NOT DETECTED
Influenza A H1 (2009) PCR: NOT DETECTED
Influenza A H1 by PCR: NOT DETECTED
Influenza A H3 by PCR: NOT DETECTED
Influenza A by PCR: NOT DETECTED
Influenza B PCR: NOT DETECTED
Mycoplasma pneumo by PCR: NOT DETECTED
Parainfluenza 1 PCR: NOT DETECTED
Parainfluenza 2 PCR: NOT DETECTED
Parainfluenza 3 PCR: NOT DETECTED
Parainfluenza 4 PCR: NOT DETECTED
Respiratory Syncytial Virus by PCR: NOT DETECTED
Rhinovirus Enterovirus PCR: NOT DETECTED
SARS-CoV-2, PCR: NOT DETECTED

## 2023-03-26 LAB — CBC WITH AUTO DIFFERENTIAL
Basophils %: 0 % (ref 0–1)
Basophils Absolute: 0 10*3/uL (ref 0.0–0.1)
Eosinophils %: 0 % (ref 0–7)
Eosinophils Absolute: 0 10*3/uL (ref 0.0–0.4)
Hematocrit: 27.9 % — ABNORMAL LOW (ref 35.0–47.0)
Hemoglobin: 8.3 g/dL — ABNORMAL LOW (ref 11.5–16.0)
Immature Granulocytes %: 0 % (ref 0.0–0.5)
Immature Granulocytes Absolute: 0 10*3/uL (ref 0.00–0.04)
Lymphocytes %: 6 % — ABNORMAL LOW (ref 12–49)
Lymphocytes Absolute: 0.8 10*3/uL (ref 0.8–3.5)
MCH: 24.5 PG — ABNORMAL LOW (ref 26.0–34.0)
MCHC: 29.7 g/dL — ABNORMAL LOW (ref 30.0–36.5)
MCV: 82.3 FL (ref 80.0–99.0)
MPV: 12 FL (ref 8.9–12.9)
Monocytes %: 1 % — ABNORMAL LOW (ref 5–13)
Monocytes Absolute: 0.1 10*3/uL (ref 0.0–1.0)
Neutrophils %: 93 % — ABNORMAL HIGH (ref 32–75)
Neutrophils Absolute: 12.8 10*3/uL — ABNORMAL HIGH (ref 1.8–8.0)
Nucleated RBCs: 0 PER 100 WBC
Platelets: 303 10*3/uL (ref 150–400)
RBC: 3.39 M/uL — ABNORMAL LOW (ref 3.80–5.20)
RDW: 17.4 % — ABNORMAL HIGH (ref 11.5–14.5)
WBC: 13.7 10*3/uL — ABNORMAL HIGH (ref 3.6–11.0)
nRBC: 0 10*3/uL (ref 0.00–0.01)

## 2023-03-26 LAB — ARTERIAL BLOOD GAS, POC
Base Excess: 3.2 mmol/L
FIO2: 40 %
IPAP/PIP/High PEEP: 15
POC Allen's Test: POSITIVE
POC HCO3: 29 MMOL/L — ABNORMAL HIGH (ref 22–26)
POC O2 SAT: 98.3 % — ABNORMAL HIGH (ref 92–97)
POC PEEP: 6 cmH2O
POC PO2: 113 MMHG — ABNORMAL HIGH (ref 80–100)
POC Pressure Support: 9 cmH2O
POC pCO2: 49.2 MMHG — ABNORMAL HIGH (ref 35.0–45.0)
POC pH: 7.38 (ref 7.35–7.45)

## 2023-03-26 LAB — PROTIME-INR
INR: 1 (ref 0.9–1.1)
Protime: 10.9 s (ref 9.0–11.1)

## 2023-03-26 LAB — COMPREHENSIVE METABOLIC PANEL W/ REFLEX TO MG FOR LOW K
ALT: 14 U/L (ref 12–78)
AST: 5 U/L — ABNORMAL LOW (ref 15–37)
Albumin/Globulin Ratio: 0.7 — ABNORMAL LOW (ref 1.1–2.2)
Albumin: 2.9 g/dL — ABNORMAL LOW (ref 3.5–5.0)
Alk Phosphatase: 95 U/L (ref 45–117)
Anion Gap: 4 mmol/L — ABNORMAL LOW (ref 5–15)
BUN/Creatinine Ratio: 19 (ref 12–20)
BUN: 14 MG/DL (ref 6–20)
CO2: 30 mmol/L (ref 21–32)
Calcium: 8.7 MG/DL (ref 8.5–10.1)
Chloride: 106 mmol/L (ref 97–108)
Creatinine: 0.72 MG/DL (ref 0.55–1.02)
Est, Glom Filt Rate: 85 mL/min/{1.73_m2} (ref >60)
Globulin: 4.2 g/dL — ABNORMAL HIGH (ref 2.0–4.0)
Glucose: 176 mg/dL — ABNORMAL HIGH (ref 65–100)
Potassium: 4.2 mmol/L (ref 3.5–5.1)
Sodium: 140 mmol/L (ref 136–145)
Total Bilirubin: 0.3 MG/DL (ref 0.2–1.0)
Total Protein: 7.1 g/dL (ref 6.4–8.2)

## 2023-03-26 LAB — POCT GLUCOSE
POC Glucose: 204 mg/dL — ABNORMAL HIGH (ref 65–117)
POC Glucose: 185 mg/dL — ABNORMAL HIGH (ref 65–117)
POC Glucose: 179 mg/dL — ABNORMAL HIGH (ref 65–117)
POC Glucose: 162 mg/dL — ABNORMAL HIGH (ref 65–117)
POC Glucose: 164 mg/dL — ABNORMAL HIGH (ref 65–117)
POC Glucose: 159 mg/dL — ABNORMAL HIGH (ref 65–117)

## 2023-03-26 LAB — PROCALCITONIN: Procalcitonin: <0.05 ng/mL

## 2023-03-26 LAB — EXTRA TUBES HOLD

## 2023-03-26 LAB — HEMOGLOBIN A1C
Estimated Avg Glucose: 140 mg/dL
Hemoglobin A1C: 6.5 % — ABNORMAL HIGH (ref 4.0–5.6)

## 2023-03-26 LAB — LACTATE, SEPSIS
Lactic Acid, Sepsis: 2.2 MMOL/L (ref 0.4–2.0)
Lactic Acid, Sepsis: 1.6 MMOL/L (ref 0.4–2.0)

## 2023-03-26 LAB — APTT: APTT: 31 s (ref 22.1–31.0)

## 2023-03-26 LAB — IRON AND TIBC
Iron % Saturation: 5 % — ABNORMAL LOW (ref 20–50)
Iron: 14 ug/dL — ABNORMAL LOW (ref 35–150)
TIBC: 284 ug/dL (ref 250–450)

## 2023-03-26 LAB — VITAMIN B12: Vitamin B-12: 775 pg/mL (ref 193–986)

## 2023-03-26 LAB — FOLATE: Folate: 9.6 ng/mL (ref 5.0–21.0)

## 2023-03-26 MED ORDER — GLUCAGON (RDNA) 1 MG IJ KIT
1 | INTRAMUSCULAR | Status: DC | PRN
Start: 2023-03-26 — End: 2023-03-25

## 2023-03-26 MED ORDER — INSULIN LISPRO 100 UNIT/ML IJ SOLN
100 | INTRAMUSCULAR | Status: DC
Start: 2023-03-26 — End: 2023-03-25

## 2023-03-26 MED ORDER — ONDANSETRON HCL 4 MG/2ML IJ SOLN
4 | INTRAMUSCULAR | Status: DC | PRN
Start: 2023-03-26 — End: 2023-04-01
  Administered 2023-03-26: 10:00:00 4 mg via INTRAVENOUS

## 2023-03-26 MED ORDER — VIAL2BAG ADAPTOR (20 MM)
1.25 | INTRAVENOUS | Status: DC
Start: 2023-03-26 — End: 2023-03-26

## 2023-03-26 MED ORDER — IPRATROPIUM-ALBUTEROL 0.5-2.5 (3) MG/3ML IN SOLN
RESPIRATORY_TRACT | Status: DC | PRN
Start: 2023-03-26 — End: 2023-04-01
  Administered 2023-03-27: 20:00:00 1 via RESPIRATORY_TRACT

## 2023-03-26 MED ORDER — GLUCOSE 4 G PO CHEW
4 | ORAL | Status: DC | PRN
Start: 2023-03-26 — End: 2023-03-25

## 2023-03-26 MED ORDER — CARVEDILOL 12.5 MG PO TABS
12.5 | ORAL | Status: DC
Start: 2023-03-26 — End: 2023-04-01
  Administered 2023-03-26 – 2023-04-01 (×11): 12.5 mg via ORAL

## 2023-03-26 MED ORDER — DEXTROSE 10 % IV BOLUS
INTRAVENOUS | Status: DC | PRN
Start: 2023-03-26 — End: 2023-04-01

## 2023-03-26 MED ORDER — DEXTROSE 10 % IV SOLN
10 | INTRAVENOUS | Status: DC | PRN
Start: 2023-03-26 — End: 2023-03-25

## 2023-03-26 MED ORDER — DEXTROSE 10 % IV BOLUS
INTRAVENOUS | Status: DC | PRN
Start: 2023-03-26 — End: 2023-03-25

## 2023-03-26 MED ORDER — PREGABALIN 100 MG PO CAPS
100 | ORAL | Status: DC
Start: 2023-03-26 — End: 2023-04-01
  Administered 2023-03-27 – 2023-04-01 (×11): 300 mg via ORAL

## 2023-03-26 MED ORDER — FUROSEMIDE 10 MG/ML IJ SOLN
10 | INTRAMUSCULAR | Status: DC
Start: 2023-03-26 — End: 2023-04-01
  Administered 2023-03-26 – 2023-04-01 (×11): 20 mg via INTRAVENOUS

## 2023-03-26 MED ORDER — QUETIAPINE FUMARATE 25 MG PO TABS
25 MG | Freq: Every evening | ORAL | Status: DC
Start: 2023-03-26 — End: 2023-04-01
  Administered 2023-03-27 – 2023-03-31 (×4): 12.5 mg via ORAL

## 2023-03-26 MED ORDER — GLUCAGON (RDNA) 1 MG IJ KIT
1 | INTRAMUSCULAR | Status: DC | PRN
Start: 2023-03-26 — End: 2023-04-01

## 2023-03-26 MED ORDER — VANCOMYCIN 2500 MG IN NS 500 ML (PREMIX) IVPB
Status: AC
Start: 2023-03-26 — End: 2023-03-26
  Administered 2023-03-26: 03:00:00 2500 mg via INTRAVENOUS

## 2023-03-26 MED ORDER — BUDESONIDE 0.25 MG/2ML IN SUSP
0.25 | RESPIRATORY_TRACT | Status: DC
Start: 2023-03-26 — End: 2023-04-01
  Administered 2023-03-27 – 2023-04-01 (×12): via RESPIRATORY_TRACT

## 2023-03-26 MED ORDER — INSULIN GLARGINE 100 UNIT/ML SC SOLN
100 | SUBCUTANEOUS | Status: DC
Start: 2023-03-26 — End: 2023-03-27
  Administered 2023-03-26 – 2023-03-27 (×2): 16 [IU]/kg via SUBCUTANEOUS

## 2023-03-26 MED ORDER — BUDESONIDE 0.25 MG/2ML IN SUSP
0.25 | RESPIRATORY_TRACT | Status: DC
Start: 2023-03-26 — End: 2023-03-26

## 2023-03-26 MED ORDER — LISINOPRIL 20 MG PO TABS
20 | ORAL | Status: DC
Start: 2023-03-26 — End: 2023-04-01
  Administered 2023-03-29 – 2023-04-01 (×4): 20 mg via ORAL

## 2023-03-26 MED ORDER — DEXTROSE 10 % IV SOLN
10 | INTRAVENOUS | Status: DC | PRN
Start: 2023-03-26 — End: 2023-04-01

## 2023-03-26 MED ORDER — GUAIFENESIN 100 MG/5ML PO LIQD
100 | ORAL | Status: DC | PRN
Start: 2023-03-26 — End: 2023-04-01
  Administered 2023-03-26: 15:00:00 200 mg via ORAL

## 2023-03-26 MED ORDER — INSULIN LISPRO 100 UNIT/ML IJ SOLN
100 | INTRAMUSCULAR | Status: DC
Start: 2023-03-26 — End: 2023-03-31
  Administered 2023-03-26 – 2023-03-28 (×2): 1 [IU] via SUBCUTANEOUS
  Administered 2023-03-29: 01:00:00 2 [IU] via SUBCUTANEOUS
  Administered 2023-03-29 (×2): 1 [IU] via SUBCUTANEOUS
  Administered 2023-03-30: 03:00:00 4 [IU] via SUBCUTANEOUS
  Administered 2023-03-30: 18:00:00 2 [IU] via SUBCUTANEOUS
  Administered 2023-03-30 – 2023-03-31 (×2): 1 [IU] via SUBCUTANEOUS
  Administered 2023-03-31: 02:00:00 3 [IU] via SUBCUTANEOUS

## 2023-03-26 MED ORDER — GLUCOSE 4 G PO CHEW
4 | ORAL | Status: DC | PRN
Start: 2023-03-26 — End: 2023-04-01

## 2023-03-26 MED ORDER — PREGABALIN 100 MG PO CAPS
100 | ORAL | Status: DC
Start: 2023-03-26 — End: 2023-03-26
  Administered 2023-03-26: 19:00:00 150 mg via ORAL

## 2023-03-26 MED ORDER — VIAL2BAG ADAPTOR (20 MM)
1 | INTRAVENOUS | Status: DC
Start: 2023-03-26 — End: 2023-03-27
  Administered 2023-03-26 – 2023-03-27 (×2): 1000 mg via INTRAVENOUS

## 2023-03-26 MED FILL — ACETAMINOPHEN 325 MG PO TABS: 325 MG | ORAL | Qty: 2

## 2023-03-26 MED FILL — CEFEPIME HCL 2 G IV SOLR: 2 g | INTRAVENOUS | Qty: 2

## 2023-03-26 MED FILL — CARVEDILOL 12.5 MG PO TABS: 12.5 MG | ORAL | Qty: 1

## 2023-03-26 MED FILL — LANTUS 100 UNIT/ML SC SOLN: 100 UNIT/ML | SUBCUTANEOUS | Qty: 16

## 2023-03-26 MED FILL — DEXTROSE 10 % IV SOLN: 10 % | INTRAVENOUS | Qty: 1000

## 2023-03-26 MED FILL — LOVENOX 30 MG/0.3ML IJ SOSY: 30 MG/0.3ML | INTRAMUSCULAR | Qty: 0.3

## 2023-03-26 MED FILL — FUROSEMIDE 10 MG/ML IJ SOLN: 10 MG/ML | INTRAMUSCULAR | Qty: 2

## 2023-03-26 MED FILL — LYRICA 50 MG PO CAPS: 50 MG | ORAL | Qty: 1

## 2023-03-26 MED FILL — GUAIFENESIN 100 MG/5ML PO SOLN: 100 MG/5ML | ORAL | Qty: 10

## 2023-03-26 MED FILL — HUMALOG 100 UNIT/ML IJ SOLN: 100 UNIT/ML | INTRAMUSCULAR | Qty: 1

## 2023-03-26 MED FILL — VANCOMYCIN HCL 1 G IV SOLR: 1 g | INTRAVENOUS | Qty: 1000

## 2023-03-26 MED FILL — ONDANSETRON HCL 4 MG/2ML IJ SOLN: 4 MG/2ML | INTRAMUSCULAR | Qty: 2

## 2023-03-26 MED FILL — VANCOMYCIN 2500 MG IN NS 500 ML (PREMIX) IVPB: Qty: 500

## 2023-03-26 MED FILL — MONOJECT FLUSH SYRINGE 0.9 % IV SOLN: 0.9 % | INTRAVENOUS | Qty: 40

## 2023-03-26 NOTE — ED Notes (Signed)
Pt alert and oriented x4. MD paged requesting for pain meds for the pt. The pt said that she doesn't take pills on an empty stomach because it makes her nauseous. This nurse informed the pt that she is NPO, but asked if she would be fine with the tylenol if she could also have zofran. Pt agreed. MD paged. MD at bedside and gave the okay to take the pt off Bipap and see how she does. Pt currently on 3L and satting at 94%.

## 2023-03-26 NOTE — Care Coordination-Inpatient (Addendum)
Care Management Initial Assessment       RUR:14%  Readmission? No  1st IM letter given? Yes  1st [employer] letter given: No     03/26/23 1502   Service Assessment   Patient Orientation Alert and Oriented   Cognition Alert   History Provided By Patient   Primary Caregiver Self   Patient's Healthcare Decision Maker is: Named in [place]  Jamie Bryant son [phone]                                   Jamie Bryant  daughter [phone])   Prior Functional Level Independent in ADLs/IADLs   Can patient return to prior living arrangement Unknown at present   Ability to make needs known: Good   Financial Resources Medicare   Social/Functional History   Lives With Alone   Type of Home [place]  (CPAP)   ADL Assistance Independent   Occupation Retired     EMR reviewed. Noted transfer from ED to [facility]. Brief encounter w/ patient noted current rule out covid-19. Patient wearing CPAP (difficult for this writer to understand) . Wound Nurse arrived at time of assessment to provide treatment. Patient able to provide her drivers license informed Jamie Bryant is her full name instead of Jamie Bryant. CM noted charts to be merged. Patient states has 2 for AMD able to locate document in original EMR w/son Jamie Bryant and daughter listed.     Patient resides in [place] at [facility] and utilizes a rollator.     CM Department will follow for transitions of care needs.

## 2023-03-26 NOTE — Wound Image (Signed)
Wound Care Note:     New consult placed by physician request for bilateral leg wounds    Patient is on Droplet Plus Precautions for COVID-19 positive  PPE:  N95, face shield, gown and gloves    Chart shows:  Admitted for acute respiratory failure with hypoxia and hypercapnia with a history of DM, HTN, HLD, chronic venous insufficiency, chronic bilateral lower extremity ulcers, arthritis, asthma, CKD, depression, fibromyalgia, gastroparesis, neuropathy, CVA, psychotic disorder, obesity    WBC = 13.7 on 03/26/23  Admitted from home    Assessment:   Patient is alert and talking, continent with some assistance needed in repositioning.    Bed: [facility]  Diet: NPO  Patient reports no pain.      Bilateral heels, buttocks, and sacral skin intact and without erythema.  Sacrum and buttocks were assessed by RN's, no erythema or wounds.    Palpable DP pulses bilaterally.      1. POA left medial lower leg venous ulcer measures 2.5 cm x 1.5 cm x 0.1 cm, wound bed is mostly white with some pink, wound edges are open, peri-wound with hemosiderin staining.  Optifoam Gentle NB AG applied, covered with 4 x 4's and secured with roll gauze and tape.      2.  POA right medial lower leg venous ulcer is an area measuring 7 cm x 3 cm x 0.1, wound bed is pink, wound edges is open, peri-wound intact.  Optifoam Gentle NB AG applied, covered with 4 x 4's and secured with roll gauze and tape.      Spoke with Dr. Dewain Penning, wound care orders obtained.    Patient repositioned supine.       Recommendations:    Bilateral lower legs- Every other day cleanse with VASHE, apply a piece of Optifoam Gentle NB AG, cover with 4 x 4's and secure with roll gauze and tape.    Skin Care & Pressure Prevention:  Minimize layers of linen/pads under patient to optimize support surface.    Turn/reposition approximately every 2 hours and offload heels.  Manage incontinence / promote continence   Nourishing Skin  Cream to dry skin, minimize use of briefs when able    Discussed above plan with patient & Monia Pouch, RN    Transition of Care: Plan to follow as needed while admitted to hospital.    Rodena Piety "Abigail Miyamoto, BSN, RN, [facility]  Certified Wound and Ostomy Nurse  office [phone]  Best way to contact me is through [place]

## 2023-03-26 NOTE — ED Notes (Signed)
Report given to Dana RN

## 2023-03-26 NOTE — ED Notes (Signed)
Pt sheets, bed pads, & brief changed. New pure wik placed. Pt repositioned in bed.

## 2023-03-26 NOTE — Progress Notes (Signed)
Hospitalist Progress Note  Bonner Puna, MD  Answering service: [phone] OR [phone] from in house phone        Date of Service:  03/26/2023  NAME:  Jamie Bryant  DOB:  [birth year]  MRN:  [id number]      Admission Summary:   Jamie Bryant is a 82 y.o. female with past medical history of type 2 diabetes mellitus, hypertension, hyperlipidemia, chronic venous sufficiency, chronic bilateral lower extremity edema, chronic venous stasis lower extremity ulcers, arthritis, asthma, CKD, depression, fibromyalgia, gastroparesis, neuropathy, stroke, psychotic disorder unspecified, obesity presented to the emergency department via EMS from home with chief complaint of shortness of breath.  Patient is a limited historian.  Majority of history is obtained per review of ED electronic medical records.  Per the ER triage note, patient was noted short of breath.  EMS was dispatched to the scene and gave patient Furosemide 80 mg IV x 1 dose, Dexamethasone, and Nitroglycerin (paste) on chest.  Initial O2 saturations were in the 60s.  Patient reportedly was placed on CPAP.  On arrival to the ED, initial recorded vital signs were temperature 99.7 F, BP 141/59, heart rate 99, respiratory rate 28, O2 saturation 97% on CPAP FiO2 50%.  12-lead EKG showed normal sinus rhythm at 93 bpm.  Chest reportable showed hazy opacification of both lungs right greater than left, likely pulmonary edema.  ABG at 1743 hrs.: pH 7.34, pCO2 52.5, pO2 114, HCO3 28.3, base excess 1.8, SaO2 98.1% on FiO2 50% IPAP 15/ EPAP 6.  Abnormal labs included WBC 25.6, neutrophils 88%, hemoglobin 9.2, blood glucose 262, proBNP 556, albumin 3.4.  ED then consulted hospitalist.  On arrival to bedside, patient is on BiPAP.  She is mostly mumbling with mostly incomprehensible speech and not answering questions appropriately.     Interval history / Subjective:   Seen and  examined for follow up of respiratory failure, AMS. Mental status better. She has been on and off of the BiPAP, but does not seem to be able to tolerate too long off of BiPAP. She has remained hemodynamically stable. No complaints of pain.      Assessment & Plan:     Acute respiratory failure with hypoxia and hypercapnia  -Admit to IMCU  -If patient status worsens and not improved, consider for transfer to ICU.   -Currently on BiPAP  -Continuous pulse oximetry monitoring  -Continue NIV  -Possibly due to pulmonary edema  -Lasix     Acute pulmonary edema  -Reportedly given Lasix 40 mg IV x 1 dose prior to admission per EMS  -Order to echocardiogram is still concern for underlying CHF.  proBNP level is not significantly elevated to diagnosis same.  -Place on strict I's and O's and daily weights  -Lasix 20 mg IV BID  -Pending echo   -Continue current management      Sepsis unspecified organism unspecified whether acute organ dysfunction  -Initial clinical indicators: WBC 25.6, RR 28  -No sepsis 30 ml/kg IVF bolus ordered due to concern for pulmonary edema  -Order empiric IV antibiotic coverage:  Cefepime 2000 mg IV every 8 hours  Vancomycin pharmacy dosing  -Check lactic acid level  -Possible PNA?  -Procal <0.05     AMS (altered mental status)  -Uncertain of patient's baseline mental status.  Past PCP chart record notes possible dementia.  -Seems to have improved throughout the day  -Possibly due to hypercarbia      Uncontrolled type 2 diabetes mellitus with hyperglycemia  -  Order insulin correctional sliding scale coverage  -Order scheduled point-of-care blood glucose monitoring  -Order hemoglobin A1c level   -Order basal insulin coverage: Lantus     Normocytic normochromic anemia  -Hgb = 9.2  -repeat H & H  -transfuse if Hgb < 7.0  -check iron profile, [id number], and folic acid   -order fecal occult blood test     Chronic venous stasis ulcers of both lower extremities  -Followed by wound care clinic with diagnosis of  idiopathic chronic venous hypertension of right lower extremity with ulcer,venous stasis ulcers of left calf, and nonpressure ulcer of right calf  -Currently has Unna boots in place'  -Wound care consulted     Generalized weakness  -Plan as above  -Consider for PT/OT evaluation     Essential hypertension  -Monitor BP  -Provide BP medications as needed      CRITICAL CARE ATTESTATION:  I had a face to face encounter with the patient, reviewed and interpreted patient data including clinical events, labs, images, vital signs, I/O's, and examined patient.  I have discussed the case and the plan and management of the patient's care with the consulting services, the bedside nurses and necessary ancillary providers.       NOTE OF PERSONAL INVOLVEMENT IN CARE   This patient has a high probability of imminent, clinically significant deterioration, which requires the highest level of preparedness to intervene urgently. I participated in the decision-making and personally managed or directed the management of the following life and organ supporting interventions that required my frequent assessment to treat or prevent imminent deterioration.     I personally spent 36 minutes of critical care time.  This is time spent at this critically ill patient's bedside actively involved in patient care as well as the coordination of care and discussions with the patient's family.  This does not include any procedural time which has been billed separately.       Code status: full  Prophylaxis: lovenox  Care Plan discussed with: patient, nurse   Anticipated Disposition: TBD     Principal Problem:    Acute respiratory failure with hypoxia and hypercapnia ([place])  Resolved Problems:    * No resolved hospital problems. *            Review of Systems:   Pertinent items are noted in HPI.         Vital Signs:    Last 24hrs VS reviewed since prior progress note. Most recent are:  BP (!) 148/70   Pulse (!) 101   Temp 98.2 F (36.8 C) (Axillary)    Resp 18   Ht 1.626 m (5\' 4" )   Wt 108.1 kg (238 lb 5.1 oz)   SpO2 100%   BMI 40.91 kg/m        Intake/Output Summary (Last 24 hours) at 03/26/2023 1146  Last data filed at 03/26/2023 [id number]  Gross per 24 hour   Intake 520.76 ml   Output --   Net 520.76 ml        Physical Examination:     I had a face to face encounter with this patient and independently examined them on 03/26/2023 as outlined below:          General : alert x 3, awake, no acute distress,   HEENT: PEERL, EOMI, moist mucus membrane  Neck: supple, no JVD, no meningeal signs  Chest: severely decreased airflow bilaterally   CVS: S1 S2 heard, Capillary refill less than 2 seconds  Abd: soft/ non tender, non distended, BS physiological,   Ext: no clubbing, no cyanosis, no edema, brisk 2+ DP pulses. Unna boots on both LE   Neuro/Psych: pleasant mood and affect, CN 2-12 grossly intact, sensory grossly within normal limit  Skin: warm           Data Review:    Review and/or order of clinical lab test  Review and/or order of tests in the radiology section of CPT  Review and/or order of tests in the medicine section of CPT    I have independently reviewed and interpreted patient's lab and all other diagnostic data    Notes reviewed from all clinical/nonclinical/nursing services involved in patient's clinical care. Care coordination discussions were held with appropriate clinical/nonclinical/ nursing providers based on care coordination needs.     Labs:     Recent Labs     03/25/23  1702 03/26/23  0112   WBC 25.6* 13.7*   HGB 9.2* 8.3*   HCT 30.5* 27.9*   PLT 395 303     Recent Labs     03/25/23  1702 03/26/23  0112   NA 137 140  140   K 4.2 4.7  4.2   CL 104 106  106   CO2 30 29  30    BUN 13 14  14    MG 2.1  --      Recent Labs     03/25/23  1702 03/26/23  0112   ALT 14 14   GLOB 4.5* 4.2*     Recent Labs     03/26/23  0112   INR 1.0   APTT 31.0      Recent Labs     03/26/23  0112   TIBC 284      No results found for: "FOL", "RBCF"   No results for input(s):  "PH", "PCO2", "PO2" in the last 72 hours.  No results for input(s): "CPK" in the last 72 hours.    Invalid input(s): "CPKMB", "CKNDX", "TROIQ"  No results found for: "CHOL", "[place]", "CHLST", "CHOLV", "HDL", "HDLC", "LDL", "LDLC", "TGLX", "TRIGL"  No results found for: "GLUCPOC"        Medications Reviewed:     Current Facility-Administered Medications   Medication Dose Route Frequency    ondansetron (ZOFRAN) injection 4 mg  4 mg IntraVENous Q6H PRN    QUEtiapine (SEROQUEL) tablet 12.5 mg  12.5 mg Oral Nightly    guaiFENesin (ROBITUSSIN) 100 MG/5ML liquid 200 mg  200 mg Oral Q4H PRN    sodium chloride flush 0.9 % injection 5-40 mL  5-40 mL IntraVENous 2 times per day    sodium chloride flush 0.9 % injection 5-40 mL  5-40 mL IntraVENous PRN    0.9 % sodium chloride infusion   IntraVENous PRN    enoxaparin Sodium (LOVENOX) injection 30 mg  30 mg SubCUTAneous BID    acetaminophen (TYLENOL) tablet 650 mg  650 mg Oral Q6H PRN    Or    acetaminophen (TYLENOL) suppository 650 mg  650 mg Rectal Q6H PRN    sodium chloride flush 0.9 % injection 5-40 mL  5-40 mL IntraVENous 2 times per day    sodium chloride flush 0.9 % injection 5-40 mL  5-40 mL IntraVENous PRN    0.9 % sodium chloride infusion   IntraVENous PRN    ceFEPIme (MAXIPIME) 2,000 mg in sodium chloride 0.9 % 100 mL IVPB (mini-bag)  2,000 mg IntraVENous Q8H    Vancomycin - Pharmacy to Dose  Other RX Placeholder    vancomycin (VANCOCIN) 1,250 mg in sodium chloride 0.9 % 250 mL IVPB (Vial2Bag)  1,250 mg IntraVENous Q24H    insulin glargine (LANTUS) injection vial 16 Units  0.15 Units/kg SubCUTAneous Nightly    glucose chewable tablet 16 g  4 tablet Oral PRN    dextrose bolus 10% 125 mL  125 mL IntraVENous PRN    Or    dextrose bolus 10% 250 mL  250 mL IntraVENous PRN    glucagon injection 1 mg  1 mg SubCUTAneous PRN    dextrose 10 % infusion   IntraVENous Continuous PRN    insulin lispro (HUMALOG) injection vial 0-4 Units  0-4 Units SubCUTAneous Q4H     No current  outpatient medications on file.     ______________________________________________________________________  EXPECTED LENGTH OF STAY: Unable to retrieve estimated LOS  ACTUAL LENGTH OF STAY:          [place]  Elias Massare, MD

## 2023-03-26 NOTE — Progress Notes (Addendum)
16:28 Attempted to call Home Health Nurse upon pts request to give her updates and was unable to reach her. Left a voicemail for callback    16:44 Spoke to [place] Nurse [facility])  for updates- per [facility] nurse patient has extensive psych history including dementia, delusions, paranoia, and sometimes auditory/ visual hallucinations. [facility] nurse stated she does not take anything at home for anxiety or these symptoms. Pt has a hard time trusting and is afraid of being committed to a psych facility. Music helps calm her down when anxious, and if you have any questions feel free to call  [phone]    18:45 Spoke with Jamey Ripa (pt sister) requested that updates and discharge plans be directed to Gray Bernhardt (Daughter) or Bary Leriche (Son- primary)

## 2023-03-27 LAB — BASIC METABOLIC PANEL
Anion Gap: 4 mmol/L — ABNORMAL LOW (ref 5–15)
BUN/Creatinine Ratio: 23 — ABNORMAL HIGH (ref 12–20)
BUN: 19 MG/DL (ref 6–20)
CO2: 32 mmol/L (ref 21–32)
Calcium: 9 MG/DL (ref 8.5–10.1)
Chloride: 108 mmol/L (ref 97–108)
Creatinine: 0.81 MG/DL (ref 0.55–1.02)
Est, Glom Filt Rate: 74 mL/min/{1.73_m2} (ref >60)
Glucose: 103 mg/dL — ABNORMAL HIGH (ref 65–100)
Potassium: 3.7 mmol/L (ref 3.5–5.1)
Sodium: 144 mmol/L (ref 136–145)

## 2023-03-27 LAB — HEMOGLOBIN AND HEMATOCRIT
Hematocrit: 26 % — ABNORMAL LOW (ref 35.0–47.0)
Hemoglobin: 8 g/dL — ABNORMAL LOW (ref 11.5–16.0)

## 2023-03-27 LAB — BLOOD GAS, ARTERIAL
Base Excess, Arterial: 2.3 mmol/L
FIO2 Arterial: 45 %
HCO3, Arterial: 28 mmol/L — ABNORMAL HIGH (ref 22–26)
IPAP/PIP: 15
POC O2 SAT: 98 % — ABNORMAL HIGH (ref 92–97)
POC PEEP/CPA: 6
Set Rate, POC: 10
pCO2, Arterial: 47 mmHg — ABNORMAL HIGH (ref 35–45)
pH, Arterial: 7.39 (ref 7.35–7.45)
pO2, Arterial: 101 mmHg — ABNORMAL HIGH (ref 80–100)

## 2023-03-27 LAB — CBC
Hematocrit: 23.3 % — ABNORMAL LOW (ref 35.0–47.0)
Hemoglobin: 7 g/dL — ABNORMAL LOW (ref 11.5–16.0)
MCH: 24.7 PG — ABNORMAL LOW (ref 26.0–34.0)
MCHC: 30 g/dL (ref 30.0–36.5)
MCV: 82.3 FL (ref 80.0–99.0)
MPV: 12.5 FL (ref 8.9–12.9)
Nucleated RBCs: 0 PER 100 WBC
Platelets: 299 10*3/uL (ref 150–400)
RBC: 2.83 M/uL — ABNORMAL LOW (ref 3.80–5.20)
RDW: 17.3 % — ABNORMAL HIGH (ref 11.5–14.5)
WBC: 12.2 10*3/uL — ABNORMAL HIGH (ref 3.6–11.0)
nRBC: 0 10*3/uL (ref 0.00–0.01)

## 2023-03-27 LAB — D-DIMER, QUANTITATIVE: D-Dimer, Quant: 3.22 mg/L FEU — ABNORMAL HIGH (ref 0.00–0.65)

## 2023-03-27 LAB — POCT GLUCOSE
POC Glucose: 152 mg/dL — ABNORMAL HIGH (ref 65–117)
POC Glucose: 123 mg/dL — ABNORMAL HIGH (ref 65–117)
POC Glucose: 87 mg/dL (ref 65–117)
POC Glucose: 149 mg/dL — ABNORMAL HIGH (ref 65–117)
POC Glucose: 156 mg/dL — ABNORMAL HIGH (ref 65–117)
POC Glucose: 169 mg/dL — ABNORMAL HIGH (ref 65–117)
POC Glucose: 177 mg/dL — ABNORMAL HIGH (ref 65–117)

## 2023-03-27 LAB — EKG 12-LEAD
Atrial Rate: 93 {beats}/min
Diagnosis: NORMAL
P Axis: 67 degrees
P-R Interval: 134 ms
Q-T Interval: 352 ms
QRS Duration: 78 ms
QTc Calculation (Bazett): 437 ms
R Axis: 24 degrees
T Axis: 26 degrees
Ventricular Rate: 93 {beats}/min

## 2023-03-27 LAB — CULTURE, MRSA, SCREENING

## 2023-03-27 LAB — PROCALCITONIN: Procalcitonin: 1.24 ng/mL

## 2023-03-27 LAB — TROPONIN: Troponin, High Sensitivity: 28 ng/L (ref 0–51)

## 2023-03-27 MED ORDER — SODIUM CHLORIDE (PF) 0.9 % IJ SOLN
0.9 | INTRAMUSCULAR | Status: DC
Start: 2023-03-27 — End: 2023-03-30
  Administered 2023-03-27 – 2023-03-30 (×6): 20 mg via INTRAVENOUS

## 2023-03-27 MED ORDER — METHYLPREDNISOLONE SODIUM SUCC 40 MG IJ SOLR
40 | INTRAMUSCULAR | Status: DC
Start: 2023-03-27 — End: 2023-03-27

## 2023-03-27 MED ORDER — CEFTRIAXONE SODIUM 1 G IJ SOLR
1 | INTRAMUSCULAR | Status: AC
Start: 2023-03-27 — End: 2023-03-29
  Administered 2023-03-27 – 2023-03-29 (×3): 1000 mg via INTRAVENOUS

## 2023-03-27 MED ORDER — RACEPINEPHRINE HCL 2.25 % IN NEBU
2.25 | RESPIRATORY_TRACT | Status: DC | PRN
Start: 2023-03-27 — End: 2023-04-01

## 2023-03-27 MED ORDER — METHYLPREDNISOLONE SODIUM SUCC 40 MG IJ SOLR
40 | INTRAMUSCULAR | Status: AC
Start: 2023-03-27 — End: 2023-03-27
  Administered 2023-03-27: 21:00:00 40 mg via INTRAVENOUS

## 2023-03-27 MED ORDER — INSULIN GLARGINE 100 UNIT/ML SC SOLN
100 | SUBCUTANEOUS | Status: DC
Start: 2023-03-27 — End: 2023-04-01
  Administered 2023-03-28 – 2023-04-01 (×5): 14 [IU]/kg via SUBCUTANEOUS

## 2023-03-27 MED ORDER — PERFLUTREN LIPID MICROSPHERE IV SUSP: INTRAVENOUS | Status: AC

## 2023-03-27 MED ORDER — SODIUM CHLORIDE 0.9 % IN NEBU
0.9 | RESPIRATORY_TRACT | Status: DC | PRN
Start: 2023-03-27 — End: 2023-04-01

## 2023-03-27 MED ORDER — IOPAMIDOL 76 % IV SOLN
76 | INTRAVENOUS | Status: AC | PRN
Start: 2023-03-27 — End: 2023-03-27
  Administered 2023-03-27: 20:00:00 80 mL via INTRAVENOUS

## 2023-03-27 MED ORDER — METHYLPREDNISOLONE SODIUM SUCC 40 MG IJ SOLR
40 | INTRAMUSCULAR | Status: AC
Start: 2023-03-27 — End: 2023-03-27
  Administered 2023-03-27: 23:00:00 40 mg via INTRAVENOUS

## 2023-03-27 MED ORDER — DIPHENHYDRAMINE HCL 50 MG/ML IJ SOLN
50 | INTRAMUSCULAR | Status: DC | PRN
Start: 2023-03-27 — End: 2023-04-01
  Administered 2023-03-28 – 2023-03-31 (×2): 25 mg via INTRAVENOUS

## 2023-03-27 MED ORDER — FAMOTIDINE (PF) 20 MG/2ML IV SOLN
20 | INTRAVENOUS | Status: DC
Start: 2023-03-27 — End: 2023-03-27

## 2023-03-27 MED ORDER — DOXYCYCLINE HYCLATE 100 MG PO TABS
100 | ORAL | Status: DC
Start: 2023-03-27 — End: 2023-04-01
  Administered 2023-03-28 – 2023-04-01 (×9): 100 mg via ORAL

## 2023-03-27 MED ORDER — SODIUM CHLORIDE 0.9 % IN NEBU
0.9 | RESPIRATORY_TRACT | Status: DC | PRN
Start: 2023-03-27 — End: 2023-03-27

## 2023-03-27 MED ORDER — RACEPINEPHRINE HCL 2.25 % IN NEBU
2.25 | RESPIRATORY_TRACT | Status: AC
Start: 2023-03-27 — End: 2023-03-27
  Administered 2023-03-27: 21:00:00 0.5 mL via RESPIRATORY_TRACT

## 2023-03-27 MED ORDER — METHYLPREDNISOLONE SODIUM SUCC 40 MG IJ SOLR
40 | INTRAMUSCULAR | Status: DC
Start: 2023-03-27 — End: 2023-03-31
  Administered 2023-03-28 – 2023-03-31 (×8): 40 mg via INTRAVENOUS

## 2023-03-27 MED ORDER — FUROSEMIDE 10 MG/ML IJ SOLN
10 | INTRAMUSCULAR | Status: DC
Start: 2023-03-27 — End: 2023-03-31

## 2023-03-27 MED FILL — CARVEDILOL 12.5 MG PO TABS: 12.5 MG | ORAL | Qty: 1

## 2023-03-27 MED FILL — VANCOMYCIN HCL 1 G IV SOLR: 1 g | INTRAVENOUS | Qty: 1000

## 2023-03-27 MED FILL — LYRICA 100 MG PO CAPS: 100 MG | ORAL | Qty: 3

## 2023-03-27 MED FILL — ARFORMOTEROL TARTRATE 15 MCG/2ML IN NEBU: 15 MCG/2ML | RESPIRATORY_TRACT | Qty: 2

## 2023-03-27 MED FILL — METHYLPREDNISOLONE SODIUM SUCC 40 MG IJ SOLR: 40 MG | INTRAMUSCULAR | Qty: 80

## 2023-03-27 MED FILL — QUETIAPINE FUMARATE 25 MG PO TABS: 25 MG | ORAL | Qty: 1

## 2023-03-27 MED FILL — FUROSEMIDE 10 MG/ML IJ SOLN: 10 MG/ML | INTRAMUSCULAR | Qty: 2

## 2023-03-27 MED FILL — ISOVUE-370 76 % IV SOLN: 76 % | INTRAVENOUS | Qty: 100

## 2023-03-27 MED FILL — CEFTRIAXONE SODIUM 1 G IJ SOLR: 1 g | INTRAMUSCULAR | Qty: 1000

## 2023-03-27 MED FILL — ASTHMANEFRIN REFILL 2.25 % IN NEBU: 2.25 % | RESPIRATORY_TRACT | Qty: 0.5

## 2023-03-27 MED FILL — METHYLPREDNISOLONE SODIUM SUCC 40 MG IJ SOLR: 40 MG | INTRAMUSCULAR | Qty: 40

## 2023-03-27 MED FILL — ACETAMINOPHEN 325 MG PO TABS: 325 MG | ORAL | Qty: 2

## 2023-03-27 MED FILL — CEFEPIME HCL 2 G IV SOLR: 2 g | INTRAVENOUS | Qty: 2

## 2023-03-27 MED FILL — STERILE WATER FOR INJECTION IJ SOLN: INTRAMUSCULAR | Qty: 10

## 2023-03-27 MED FILL — FUROSEMIDE 10 MG/ML IJ SOLN: 10 MG/ML | INTRAMUSCULAR | Qty: 4

## 2023-03-27 MED FILL — IPRATROPIUM-ALBUTEROL 0.5-2.5 (3) MG/3ML IN SOLN: RESPIRATORY_TRACT | Qty: 3

## 2023-03-27 MED FILL — PERFLUTREN LIPID MICROSPHERE IV SUSP: INTRAVENOUS | Qty: 2

## 2023-03-27 MED FILL — FAMOTIDINE (PF) 20 MG/2ML IV SOLN: 20 MG/2ML | INTRAVENOUS | Qty: 2

## 2023-03-27 MED FILL — LOVENOX 30 MG/0.3ML IJ SOSY: 30 MG/0.3ML | INTRAMUSCULAR | Qty: 0.3

## 2023-03-27 MED FILL — SODIUM CHLORIDE 0.9 % IN NEBU: 0.9 % | RESPIRATORY_TRACT | Qty: 3

## 2023-03-27 MED FILL — LANTUS 100 UNIT/ML SC SOLN: 100 UNIT/ML | SUBCUTANEOUS | Qty: 16

## 2023-03-27 NOTE — Progress Notes (Signed)
03/26/2023 06:26:  The patient hemoglobin is 7.0 from 8.3.  The patient had scant blood on tissue after bowel movement earlier in the shift.  I just checked the patient again, and there is no rectal bleed.  Gasper Sells, APRN, is notified of this. No order from Judson Roch, but there is an order for a hemoglobin and hematocrit at 12:00 today.

## 2023-03-27 NOTE — Consults (Signed)
[facility]  PSYCHIATRIC CONSULT NOTE:    Name: Jamie Bryant  MR#: [id number]  DOB: 09/11/1941  ACCOUNT#: [id number]  ADMIT DATE: 03/25/2023    REASON FOR CONSULT: Untreated psychiatric concerns     HISTORY OF PRESENTING COMPLAINT:  Jamie Bryant is a 82 y.o. female with PMH type 2 diabetes mellitus, hypertension, hyperlipidemia, chronic venous sufficiency, chronic bilateral lower extremity edema, chronic venous stasis lower extremity ulcers, arthritis, asthma, CKD, depression, fibromyalgia, gastroparesis, neuropathy, stroke, psychotic disorder unspecified, obesity, who is currently admitted to the medical floor at [facility] for evaluation and management of shortness of breath as well as altered mental status. The pt has been exhibiting paranoid delusions, and reported that she was seeing dead people, snakes in her apartment, believes an unknown man is tearing at her legs when she is asleep, and endorsed auditory hallucinations of a sizzling noise, olfactory hallucinations of "a chemical smell," and visual hallucinations of blue lights. She reported to social work that she believed someone was spying on her through her cell phone.   During evaluation, the pt had just returned from [place]. Her cousins were at bedside and sister was on speaker phone during evaluation. The pt did not exhibit any psychosis as she had to social work earlier in the day, though she was notably distressed due to shortness of breath. Before our evaluation ended due to SOB, she stated she's struggled with depression in the past due to suffering several losses in her family, but denied any significant mental health concerns or any significant mental health history. She denied any SI/HI/AH/VH. The pt was experiencing SOB, leading to a rapid response, curtailing our evaluation.     PAST PSYCHIATRIC HISTORY: Denies hx of inpatient psychiatric treatment, denies hx of suicide attempts, not seeing a psychiatrist currently.     SUBSTANCE  ABUSE HISTORY: Denies    PSYCHOSOCIAL HISTORY: The pt has two adult children. She is a resident of [place]. She worked for many years at [employer].     MENTAL STATUS EXAM:   82 y.o. female in moderate grooming, dressed in hospital gown, moderately engaged in evaluation.   Makes fair eye contact.  Psychomotor activity is WNL, no adventitious movements  Speech is limited by SOB  Mood is described as "OK"   Affect is restricted   No perceptual abnormalities elicited; no auditory/visual hallucinations reported, no overt signs of psychosis or paranoia.   Thought process is goal directed  Thought content is negative for suicidal or homicidal ideation  Alert, awake and oriented in all spheres  Attention/Concentration are fair  Insight and judgment are fair    DIAGNOSTIC IMPRESSION: Mood disorder NOS     ASSESSMENT/PLAN:   -No new psychiatric recommendations at this time. No need for inpatient psychiatry.     Thank you for the opportunity to participate in the care of your patient. Please re-consult the psychiatry service as needed.

## 2023-03-27 NOTE — Progress Notes (Signed)
Jamie Bryant at [facility] in [facility] 4 IMCU 2. Response to Rapid Response:       Spiritual Assessment: Rapid Response    Provided support to Jamie Bryant cousin and spouse  member who was present.     Outcome:  Collaborated with staff. Provided ministry of presence, empathic listening, words of encouragement and cultivated a relationship of care and support. Family member(s) verbally expressed appreciation for today's     Plan of Care: Please contact [facility] for future services.     Jamie Bryant, [place] paging Service [phone])

## 2023-03-27 NOTE — Plan of Care (Signed)
Problem: Safety - Adult  Goal: Free from fall injury  Outcome: Progressing  Flowsheets (Taken 03/27/2023 0110)  Free From Fall Injury: Instruct family/caregiver on patient safety     Problem: Discharge Planning  Goal: Discharge to home or other facility with appropriate resources  Outcome: Progressing  Flowsheets  Taken 03/27/2023 0110 by Barb Merino, RN  Discharge to home or other facility with appropriate resources: Identify barriers to discharge with patient and caregiver  Taken 03/26/2023 2045 by Barb Merino, RN  Discharge to home or other facility with appropriate resources: Identify barriers to discharge with patient and caregiver  Problem: Skin/Tissue Integrity  Goal: Absence of new skin breakdown  Description: Monitor for areas of redness and/or skin breakdown  Outcome: Progressing

## 2023-03-27 NOTE — Progress Notes (Signed)
Hospitalist Progress Note  Bonner Puna, MD  Answering service: [phone] OR [phone] from in house phone        Date of Service:  03/27/2023  NAME:  Jamie Bryant  DOB:  02-20-1941  MRN:  [id number]      Admission Summary:   Jamie Bryant is a 82 y.o. female with past medical history of type 2 diabetes mellitus, hypertension, hyperlipidemia, chronic venous sufficiency, chronic bilateral lower extremity edema, chronic venous stasis lower extremity ulcers, arthritis, asthma, CKD, depression, fibromyalgia, gastroparesis, neuropathy, stroke, psychotic disorder unspecified, obesity presented to the emergency department via EMS from home with chief complaint of shortness of breath.  Patient is a limited historian.  Majority of history is obtained per review of ED electronic medical records.  Per the ER triage note, patient was noted short of breath.  EMS was dispatched to the scene and gave patient Furosemide 80 mg IV x 1 dose, Dexamethasone, and Nitroglycerin (paste) on chest.  Initial O2 saturations were in the 60s.  Patient reportedly was placed on CPAP.  On arrival to the ED, initial recorded vital signs were temperature 99.7 F, BP 141/59, heart rate 99, respiratory rate 28, O2 saturation 97% on CPAP FiO2 50%.  12-lead EKG showed normal sinus rhythm at 93 bpm.  Chest reportable showed hazy opacification of both lungs right greater than left, likely pulmonary edema.  ABG at 1743 hrs.: pH 7.34, pCO2 52.5, pO2 114, HCO3 28.3, base excess 1.8, SaO2 98.1% on FiO2 50% IPAP 15/ EPAP 6.  Abnormal labs included WBC 25.6, neutrophils 88%, hemoglobin 9.2, blood glucose 262, proBNP 556, albumin 3.4.  ED then consulted hospitalist.  On arrival to bedside, patient is on BiPAP.  She is mostly mumbling with mostly incomprehensible speech and not answering questions appropriately.     Interval history / Subjective:   Seen and  examined for follow up of respiratory failure, AMS. On the BiPAP when I saw her. She is more participative. Denies any acute complaints at this time.      Assessment & Plan:     Acute respiratory failure with hypoxia and hypercapnia  -Admit to IMCU  -If patient status worsens and not improved, consider for transfer to ICU.   -Currently on BiPAP  -Continuous pulse oximetry monitoring  -Continue NIV  -Possibly due to pulmonary edema  -Lasix 20 IV BID     Acute pulmonary edema  -Reportedly given Lasix 40 mg IV x 1 dose prior to admission per EMS  -Order to echocardiogram is still concern for underlying CHF.  proBNP level is not significantly elevated to diagnosis same.  -Place on strict I's and O's and daily weights  -Lasix 20 mg IV BID  -Pending echo   -Continue current management      Sepsis unspecified organism unspecified whether acute organ dysfunction  -Initial clinical indicators: WBC 25.6, RR 28  -No sepsis 30 ml/kg IVF bolus ordered due to concern for pulmonary edema  -Order empiric IV antibiotic coverage:  Cefepime 2000 mg IV every 8 hours  Vancomycin pharmacy dosing  -Check lactic acid level  -Possible PNA?  -Procal <0.05  -Continue antibiotics for now  -respiratory viral panel is negative      AMS (altered mental status)  -Uncertain of patient's baseline mental status.  Past PCP chart record notes possible dementia.  -Seems to have improved throughout the day  -Possibly due to hypercarbia      Uncontrolled type 2 diabetes mellitus with hyperglycemia  -Order insulin  correctional sliding scale coverage  -Order scheduled point-of-care blood glucose monitoring  -Order hemoglobin A1c level   -Order basal insulin coverage: Lantus 16 units  -Cutting back to 14 units due to fasting AM sugar 89     Normocytic normochromic anemia  -Hgb = 9.2  -repeat H & H  -transfuse if Hgb < 7.0  -Hb 7 this morning  -rechecking around noon as she may need a transfusion  -continue to monitor      Chronic venous stasis ulcers of both  lower extremities  -Followed by wound care clinic with diagnosis of idiopathic chronic venous hypertension of right lower extremity with ulcer,venous stasis ulcers of left calf, and nonpressure ulcer of right calf  -Currently has Unna boots in place  -Wound care consulted     Generalized weakness  -Plan as above  -Consider for PT/OT evaluation     Essential hypertension  -Monitor BP  -Provide BP medications as needed      CRITICAL CARE ATTESTATION:  I had a face to face encounter with the patient, reviewed and interpreted patient data including clinical events, labs, images, vital signs, I/O's, and examined patient.  I have discussed the case and the plan and management of the patient's care with the consulting services, the bedside nurses and necessary ancillary providers.       NOTE OF PERSONAL INVOLVEMENT IN CARE   This patient has a high probability of imminent, clinically significant deterioration, which requires the highest level of preparedness to intervene urgently. I participated in the decision-making and personally managed or directed the management of the following life and organ supporting interventions that required my frequent assessment to treat or prevent imminent deterioration.     I personally spent 37 minutes of critical care time.  This is time spent at this critically ill patient's bedside actively involved in patient care as well as the coordination of care and discussions with the patient's family.  This does not include any procedural time which has been billed separately.       Code status: full  Prophylaxis: lovenox  Care Plan discussed with: patient, nurse   Anticipated Disposition: 48 hours to home. Will likely need PT/OT evals when more medically stable      Principal Problem:    Acute respiratory failure with hypoxia and hypercapnia (HCC)  Resolved Problems:    * No resolved hospital problems. *            Review of Systems:   Pertinent items are noted in HPI.         Vital Signs:     Last 24hrs VS reviewed since prior progress note. Most recent are:  BP (!) 138/49   Pulse 86   Temp 98.3 F (36.8 C) (Oral)   Resp 27   Ht 1.626 m (5\' 4" )   Wt 100.4 kg (221 lb 4.8 oz)   SpO2 100%   BMI 37.99 kg/m        Intake/Output Summary (Last 24 hours) at 03/27/2023 0810  Last data filed at 03/27/2023 [id number]  Gross per 24 hour   Intake 292.63 ml   Output 1025 ml   Net -732.37 ml          Physical Examination:     I had a face to face encounter with this patient and independently examined them on 03/27/2023 as outlined below:          General : alert x 3, awake, no acute distress,   HEENT:  PEERL, EOMI, moist mucus membrane  Neck: supple, no JVD, no meningeal signs  Chest: severely decreased airflow bilaterally   CVS: S1 S2 heard, Capillary refill less than 2 seconds  Abd: soft/ non tender, non distended, BS physiological,   Ext: no clubbing, no cyanosis, no edema, brisk 2+ DP pulses. Unna boots on both LE   Neuro/Psych: pleasant mood and affect, CN 2-12 grossly intact, sensory grossly within normal limit  Skin: warm           Data Review:    Review and/or order of clinical lab test  Review and/or order of tests in the radiology section of CPT  Review and/or order of tests in the medicine section of CPT    I have independently reviewed and interpreted patient's lab and all other diagnostic data    Notes reviewed from all clinical/nonclinical/nursing services involved in patient's clinical care. Care coordination discussions were held with appropriate clinical/nonclinical/ nursing providers based on care coordination needs.     Labs:     Recent Labs     03/26/23  0112 03/27/23  0314   WBC 13.7* 12.2*   HGB 8.3* 7.0*   HCT 27.9* 23.3*   PLT 303 299       Recent Labs     03/25/23  1702 03/26/23  0112 03/27/23  0314   NA 137 140  140 144   K 4.2 4.7  4.2 3.7   CL 104 106  106 108   CO2 30 29  30  32   BUN 13 14  14 19    MG 2.1  --   --        Recent Labs     03/25/23  1702 03/26/23  0112   ALT 14 14   GLOB  4.5* 4.2*       Recent Labs     03/26/23  0112   INR 1.0   APTT 31.0        Recent Labs     03/26/23  0112   TIBC 284        No results found for: "FOL", "RBCF"   No results for input(s): "PH", "PCO2", "PO2" in the last 72 hours.  No results for input(s): "CPK" in the last 72 hours.    Invalid input(s): "CPKMB", "CKNDX", "TROIQ"  No results found for: "CHOL", "[place]", "CHLST", "CHOLV", "HDL", "HDLC", "LDL", "LDLC", "TGLX", "TRIGL"  No results found for: "GLUCPOC"        Medications Reviewed:     Current Facility-Administered Medications   Medication Dose Route Frequency    ondansetron (ZOFRAN) injection 4 mg  4 mg IntraVENous Q6H PRN    QUEtiapine (SEROQUEL) tablet 12.5 mg  12.5 mg Oral Nightly    guaiFENesin (ROBITUSSIN) 100 MG/5ML liquid 200 mg  200 mg Oral Q4H PRN    furosemide (LASIX) injection 20 mg  20 mg IntraVENous BID    ipratropium 0.5 mg-albuterol 2.5 mg (DUONEB) nebulizer solution 1 Dose  1 Dose Inhalation Q4H PRN    carvedilol (COREG) tablet 12.5 mg  12.5 mg Oral BID    arformoterol 15 mcg-budesonide 0.25 mg neb solution   Nebulization BID RT    lisinopril (PRINIVIL;ZESTRIL) tablet 20 mg  20 mg Oral Nightly    vancomycin (VANCOCIN) 1,000 mg in sodium chloride 0.9 % 250 mL IVPB (Vial2Bag)  1,000 mg IntraVENous Q12H    pregabalin (LYRICA) capsule 300 mg  300 mg Oral BID    sodium chloride flush 0.9 % injection  5-40 mL  5-40 mL IntraVENous 2 times per day    sodium chloride flush 0.9 % injection 5-40 mL  5-40 mL IntraVENous PRN    0.9 % sodium chloride infusion   IntraVENous PRN    enoxaparin Sodium (LOVENOX) injection 30 mg  30 mg SubCUTAneous BID    acetaminophen (TYLENOL) tablet 650 mg  650 mg Oral Q6H PRN    Or    acetaminophen (TYLENOL) suppository 650 mg  650 mg Rectal Q6H PRN    sodium chloride flush 0.9 % injection 5-40 mL  5-40 mL IntraVENous 2 times per day    sodium chloride flush 0.9 % injection 5-40 mL  5-40 mL IntraVENous PRN    0.9 % sodium chloride infusion   IntraVENous PRN    ceFEPIme  (MAXIPIME) 2,000 mg in sodium chloride 0.9 % 100 mL IVPB (mini-bag)  2,000 mg IntraVENous Q8H    Vancomycin - Pharmacy to Dose   Other RX Placeholder    insulin glargine (LANTUS) injection vial 16 Units  0.15 Units/kg SubCUTAneous Nightly    glucose chewable tablet 16 g  4 tablet Oral PRN    dextrose bolus 10% 125 mL  125 mL IntraVENous PRN    Or    dextrose bolus 10% 250 mL  250 mL IntraVENous PRN    glucagon injection 1 mg  1 mg SubCUTAneous PRN    dextrose 10 % infusion   IntraVENous Continuous PRN    insulin lispro (HUMALOG) injection vial 0-4 Units  0-4 Units SubCUTAneous Q4H     ______________________________________________________________________  EXPECTED LENGTH OF STAY: Unable to retrieve estimated LOS  ACTUAL LENGTH OF STAY:          Buckner  Elias Massare, MD

## 2023-03-27 NOTE — Procedures (Signed)
Ultrasound guided peripheral IV placed on R Antecubital See LDA.

## 2023-03-27 NOTE — Progress Notes (Signed)
Responded to RRT. Patient had acute onset respiratory distress, with very decreased airflow. When I saw she had some stridor as well. Airway was clear, uvula not swollen, throat not swollen. Patient received lasix, solumedrol, and was placed back on BiPAP. Patient reposnded to treatment. ICU was consulted and they evaluated. Patient given racemic epinephrine as well. CXR showed no real change from previous. Similar episode in the morning. ABG done and pending troponin.    CRITICAL CARE ATTESTATION:  I had a face to face encounter with the patient, reviewed and interpreted patient data including clinical events, labs, images, vital signs, I/O's, and examined patient.  I have discussed the case and the plan and management of the patient's care with the consulting services, the bedside nurses and necessary ancillary providers.       NOTE OF PERSONAL INVOLVEMENT IN CARE   This patient has a high probability of imminent, clinically significant deterioration, which requires the highest level of preparedness to intervene urgently. I participated in the decision-making and personally managed or directed the management of the following life and organ supporting interventions that required my frequent assessment to treat or prevent imminent deterioration.     I personally spent 45 minutes of critical care time.  This is time spent at this critically ill patient's bedside actively involved in patient care as well as the coordination of care and discussions with the patient's family.  This does not include any procedural time which has been billed separately.

## 2023-03-27 NOTE — Progress Notes (Addendum)
Pulmonary, Critical Care, and Sleep Medicine~Progress Note    Name: Jamie Bryant MRN: [id number]   DOB: Jan 04, 1941 Hospital: [facility]   Date: 03/27/2023 12:12 PM Admission: 03/25/2023     Impression Plan   Acute on chronic hypoxic/hypercapnic respiratory failure.  Does not appear to be a CO2 retainer and does not have oxygen at home.  Abnormal chest x-ray: decomp heart failure > pneumonia, but possibly both   Sepsis, unknown etiology  Altered mental status as per above.  Seems to be normalizing now  Diabetes type 2  Never smoker Continue NIV at night.  She can transition back to CPAP at home  O2 titration above 90%  On cefepime/Vanco, narrow with cultures   Diuretics as required  DVT prophylaxis  Pending  ECHO   Follow up image in 4-6 wks   Discussed with hospitalist; noted CTA     Daily Progression:    3/29  Procal 1.24  Sitting up   No reported Has  Chest x-ray   IMPRESSION:  There is mild decrease in bilateral pulmonary edema.    3/28  Consult Note requested by hospitalist service    Patient presented for admission on 03/25/2023 for worsening shortness of breath.  She was brought in by EMS and was given Lasix in the field.  Seems to have done well with.  Additionally was given Decadron and Nitropaste.  Initial saturations were in 60s.  She does not wear oxygen at home.  She does have sleep apnea and is on CPAP therapy.  Followed by Dr. Leonie Green.  She denies any previous smoking history.  Mentions that she has no asthma history as well.  Initial ABGs showed mild CO2 retention.  And she did initially require bilevel pressure support.  Now is off.  White count was elevated on admission as well.  proBNP was 556.  Initial had confusion that has normalized now.  She is able to answer questions appropriately.    I have reviewed the labs and previous day's notes.    Pertinent items are noted in HPI.  No past medical history on file.   No past surgical history on file.   Prior to Admission medications     Not on File     No Known Allergies   Social History     Tobacco Use    Smoking status: Not on file    Smokeless tobacco: Not on file   Substance Use Topics    Alcohol use: Not on file      No family history on file.  OBJECTIVE:     Vital Signs:     BP (!) 142/63   Pulse 94   Temp 98 F (36.7 C)   Resp 18   Ht 1.626 m (5\' 4" )   Wt 100.4 kg (221 lb 4.8 oz)   SpO2 96%   BMI 37.99 kg/m    Temp (24hrs), Avg:98.2 F (36.8 C), Min:97.9 F (36.6 C), Max:98.5 F (36.9 C)     Intake/Output:     Last shift: 03/29 0701 - 03/29 1900  In: -   Out: 600 [Urine:600]    Last 3 shifts: 03/27 1901 - 03/29 0700  In: 813.4 [P.O.:120]  Out: 1025 [Urine:1025]        Intake/Output Summary (Last 24 hours) at 03/27/2023 1212  Last data filed at 03/27/2023 0945  Gross per 24 hour   Intake 292.63 ml   Output 1625 ml   Net -1332.37 ml  Physical Exam:                                        Exam Findings Other   General: No resp distress noted, appears stated age    82:  No ulcers, JVD not elevated, no cervical LAD    Chest: No pectus deformity, normal chest rise b/l    HEART:  RRR, no murmurs/rubs/gallops    Lungs:  CTA b/l, no rhonchi/crackles/wheeze, diminished BS at bases    ABD: Soft/NT, non rigid mildly distended    EXT: No cyanosis/clubbing/edema, normal peripheral pulses    Skin: No rashes or ulcers, no mottling    Neuro: A/O x 3        Medications:  Current Facility-Administered Medications   Medication Dose Route Frequency    insulin glargine (LANTUS) injection vial 14 Units  0.13 Units/kg SubCUTAneous Nightly    ondansetron (ZOFRAN) injection 4 mg  4 mg IntraVENous Q6H PRN    QUEtiapine (SEROQUEL) tablet 12.5 mg  12.5 mg Oral Nightly    guaiFENesin (ROBITUSSIN) 100 MG/5ML liquid 200 mg  200 mg Oral Q4H PRN    furosemide (LASIX) injection 20 mg  20 mg IntraVENous BID    ipratropium 0.5 mg-albuterol 2.5 mg (DUONEB) nebulizer solution 1 Dose  1 Dose Inhalation Q4H PRN    carvedilol (COREG) tablet 12.5 mg  12.5 mg Oral  BID    arformoterol 15 mcg-budesonide 0.25 mg neb solution   Nebulization BID RT    lisinopril (PRINIVIL;ZESTRIL) tablet 20 mg  20 mg Oral Nightly    vancomycin (VANCOCIN) 1,000 mg in sodium chloride 0.9 % 250 mL IVPB (Vial2Bag)  1,000 mg IntraVENous Q12H    pregabalin (LYRICA) capsule 300 mg  300 mg Oral BID    sodium chloride flush 0.9 % injection 5-40 mL  5-40 mL IntraVENous 2 times per day    sodium chloride flush 0.9 % injection 5-40 mL  5-40 mL IntraVENous PRN    0.9 % sodium chloride infusion   IntraVENous PRN    enoxaparin Sodium (LOVENOX) injection 30 mg  30 mg SubCUTAneous BID    acetaminophen (TYLENOL) tablet 650 mg  650 mg Oral Q6H PRN    Or    acetaminophen (TYLENOL) suppository 650 mg  650 mg Rectal Q6H PRN    sodium chloride flush 0.9 % injection 5-40 mL  5-40 mL IntraVENous 2 times per day    sodium chloride flush 0.9 % injection 5-40 mL  5-40 mL IntraVENous PRN    0.9 % sodium chloride infusion   IntraVENous PRN    ceFEPIme (MAXIPIME) 2,000 mg in sodium chloride 0.9 % 100 mL IVPB (mini-bag)  2,000 mg IntraVENous Q8H    Vancomycin - Pharmacy to Dose   Other RX Placeholder    glucose chewable tablet 16 g  4 tablet Oral PRN    dextrose bolus 10% 125 mL  125 mL IntraVENous PRN    Or    dextrose bolus 10% 250 mL  250 mL IntraVENous PRN    glucagon injection 1 mg  1 mg SubCUTAneous PRN    dextrose 10 % infusion   IntraVENous Continuous PRN    insulin lispro (HUMALOG) injection vial 0-4 Units  0-4 Units SubCUTAneous Q4H       Labs:  ABG Invalid input(s): "ABG"     CBC Recent Labs     03/25/23  1702  03/26/23  0112 03/27/23  0314   WBC 25.6* 13.7* 12.2*   HGB 9.2* 8.3* 7.0*   HCT 30.5* 27.9* 23.3*   PLT 395 303 299   MCV 81.8 82.3 82.3   MCH 24.7* 24.5* 24.7*          Metabolic  Panel Recent Labs     03/25/23  1702 03/26/23  0112 03/27/23  0314   NA 137 140  140 144   K 4.2 4.7  4.2 3.7   CL 104 106  106 108   CO2 30 29  30  32   BUN 13 14  14 19    MG 2.1  --   --    ALT 14 14  --    INR  --  1.0  --            Pertinent Labs                Sheza Strickland Lovena Le, PA-C  03/27/2023

## 2023-03-27 NOTE — Progress Notes (Signed)
Pulmonary Addnedum    Called to evaluate for respiratory distress.  Following CTA chest a rapid response was called for resp distress.  Some concern of stridor.  Was given solumedrol and racemic epi neb along with diuretic.  Placed on NIPPV and says she is less sob currently.  Echocardiogram is pending    CTA chest viewed --> No PE.  Bilat effusions R>L with compressive lower lobe atx and pulm edema    Patient Vitals for the past 4 hrs:   BP Pulse Resp SpO2 Height Weight   03/27/23 1600 (!) 180/64 (!) 103 (!) 31 93 % -- --   03/27/23 1432 (!) 121/47 -- -- -- 1.626 m (5' 4.02") 100.4 kg (221 lb 5.5 oz)   03/27/23 1427 (!) 121/47 -- -- -- 1.626 m (5' 4.02") 100.4 kg (221 lb 5.5 oz)   03/27/23 1400 -- 98 -- -- -- --   Temp (24hrs), Avg:98.2 F (36.8 C), Min:97.9 F (36.6 C), Max:98.5 F (36.9 C)    Intake/Output Summary (Last 24 hours) at 03/27/2023 1752  Last data filed at 03/27/2023 0945  Gross per 24 hour   Intake --   Output 1125 ml   Net -1125 ml       Exam:  Slow to answer questions.  On bipap.  Minimal accessory muscle use.  No stridor currently.  Decreased bs at bases.  2+ LE edema    ABG on bipap 45%:  7.39 / 47 / 101    Impression / Recommendations:  Respiratory distress at least in part 2/2 pulmonary edema.  Some concern for upper airway obstruction (had dye for CTA) no stridor and no hypotension currently.  Has received steroids and racemic epi neb.  Appears to have stabilized with medical treatment and NIPPV.  --ICU team is aware  --agree with cardiac enzymes  --would consider an additional dose of decadron and prn racemic epi nebs    D/w Dr Lillie Fragmin    Lebron Conners, MD

## 2023-03-28 LAB — ECHO (TTE) COMPLETE (PRN CONTRAST/BUBBLE/STRAIN/3D)
AV Area by Peak Velocity: 2.4 cm2
AV Area by VTI: 3.1 cm2
AV Mean Gradient: 20 mmHg
AV Mean Velocity: 2.1 m/s
AV Peak Gradient: 31 mmHg
AV Peak Velocity: 2.8 m/s
AV VTI: 51.9 cm
AV Velocity Ratio: 0.46
AVA/BSA Peak Velocity: 1.2 cm2/m2
AVA/BSA VTI: 1.5 cm2/m2
Body Surface Area: 2.13 m2
E/E' Lateral: 24.57
E/E' Ratio (Averaged): 33.79
Est. RA Pressure: 3 mmHg
Fractional Shortening 2D: 29 % (ref 28–44)
IVSd: 1.1 cm — AB (ref 0.6–0.9)
LV E' Lateral Velocity: 7 cm/s
LV E' Septal Velocity: 4 cm/s
LV Mass 2D Index: 108.2 g/m2 — AB (ref 43–95)
LV Mass 2D: 220.8 g — AB (ref 67–162)
LV RWT Ratio: 0.42
LVIDd Index: 2.55 cm/m2
LVIDd: 5.2 cm (ref 3.9–5.3)
LVIDs Index: 1.81 cm/m2
LVIDs: 3.7 cm
LVOT Area: 5.3 cm2
LVOT Diameter: 2.6 cm
LVOT Mean Gradient: 4 mmHg
LVOT Peak Gradient: 7 mmHg
LVOT Peak Velocity: 1.3 m/s
LVOT SV: 164 ml
LVOT Stroke Volume Index: 80.4 mL/m2
LVOT VTI: 30.9 cm
LVOT:AV VTI Index: 0.6
LVPWd: 1.1 cm — AB (ref 0.6–0.9)
MR Peak Gradient: 112 mmHg
MR Peak Velocity: 5.3 m/s
MV A Velocity: 0.95 m/s
MV Area by PHT: 3.8 cm2
MV Area by VTI: 3.5 cm2
MV E Velocity: 1.72 m/s
MV E Wave Deceleration Time: 202.2 ms
MV E/A: 1.81
MV Max Velocity: 2.1 m/s
MV Mean Gradient: 6 mmHg
MV Mean Velocity: 1.1 m/s
MV PHT: 58.6 ms
MV Peak Gradient: 17 mmHg
MV VTI: 46.2 cm
MV:LVOT VTI Index: 1.5
PV Max Velocity: 1.4 m/s
PV Peak Gradient: 8 mmHg
RV Free Wall Peak S': 26 cm/s
RVSP: 33 mmHg
TAPSE: 1.9 cm (ref 1.7–?)
TR Max Velocity: 2.75 m/s
TR Peak Gradient: 31 mmHg

## 2023-03-28 LAB — ARTERIAL BLOOD GAS, POC
Base Excess: 4.9 mmol/L
FIO2: 45 %
IPAP/PIP/High PEEP: 15
POC Allen's Test: POSITIVE
POC HCO3: 30.5 MMOL/L — ABNORMAL HIGH (ref 22–26)
POC O2 SAT: 99.3 % — ABNORMAL HIGH (ref 92–97)
POC PEEP: 6 cmH2O
POC PO2: 155 MMHG — ABNORMAL HIGH (ref 80–100)
POC pCO2: 49.6 MMHG — ABNORMAL HIGH (ref 35.0–45.0)
POC pH: 7.4 (ref 7.35–7.45)
Sed Rate: 10 {beats}/min

## 2023-03-28 LAB — CBC
Hematocrit: 23.7 % — ABNORMAL LOW (ref 35.0–47.0)
Hemoglobin: 7.1 g/dL — ABNORMAL LOW (ref 11.5–16.0)
MCH: 24.7 PG — ABNORMAL LOW (ref 26.0–34.0)
MCHC: 30 g/dL (ref 30.0–36.5)
MCV: 82.3 FL (ref 80.0–99.0)
MPV: 11.9 FL (ref 8.9–12.9)
Nucleated RBCs: 0 PER 100 WBC
Platelets: 295 10*3/uL (ref 150–400)
RBC: 2.88 M/uL — ABNORMAL LOW (ref 3.80–5.20)
RDW: 17.4 % — ABNORMAL HIGH (ref 11.5–14.5)
WBC: 19.5 10*3/uL — ABNORMAL HIGH (ref 3.6–11.0)
nRBC: 0 10*3/uL (ref 0.00–0.01)

## 2023-03-28 LAB — BASIC METABOLIC PANEL
Anion Gap: 2 mmol/L — ABNORMAL LOW (ref 5–15)
BUN/Creatinine Ratio: 26 — ABNORMAL HIGH (ref 12–20)
BUN: 19 MG/DL (ref 6–20)
CO2: 32 mmol/L (ref 21–32)
Calcium: 8.9 MG/DL (ref 8.5–10.1)
Chloride: 106 mmol/L (ref 97–108)
Creatinine: 0.73 MG/DL (ref 0.55–1.02)
Est, Glom Filt Rate: 84 mL/min/{1.73_m2} (ref >60)
Glucose: 177 mg/dL — ABNORMAL HIGH (ref 65–100)
Potassium: 3.8 mmol/L (ref 3.5–5.1)
Sodium: 140 mmol/L (ref 136–145)

## 2023-03-28 LAB — POCT GLUCOSE
POC Glucose: 203 mg/dL — ABNORMAL HIGH (ref 65–117)
POC Glucose: 183 mg/dL — ABNORMAL HIGH (ref 65–117)
POC Glucose: 187 mg/dL — ABNORMAL HIGH (ref 65–117)
POC Glucose: 185 mg/dL — ABNORMAL HIGH (ref 65–117)
POC Glucose: 249 mg/dL — ABNORMAL HIGH (ref 65–117)
POC Glucose: 170 mg/dL — ABNORMAL HIGH (ref 65–117)

## 2023-03-28 MED ORDER — INSULIN NPH (HUMAN) (ISOPHANE) 100 UNIT/ML SC SUSP
100 | SUBCUTANEOUS | Status: DC
Start: 2023-03-28 — End: 2023-04-01
  Administered 2023-03-28 – 2023-04-01 (×8): 10 [IU] via SUBCUTANEOUS

## 2023-03-28 MED FILL — LANTUS 100 UNIT/ML SC SOLN: 100 UNIT/ML | SUBCUTANEOUS | Qty: 14

## 2023-03-28 MED FILL — HUMULIN N 100 UNIT/ML SC SUSP: 100 UNIT/ML | SUBCUTANEOUS | Qty: 10

## 2023-03-28 MED FILL — DOXYCYCLINE HYCLATE 100 MG PO TABS: 100 MG | ORAL | Qty: 1

## 2023-03-28 MED FILL — ARFORMOTEROL TARTRATE 15 MCG/2ML IN NEBU: 15 MCG/2ML | RESPIRATORY_TRACT | Qty: 2

## 2023-03-28 MED FILL — LOVENOX 30 MG/0.3ML IJ SOSY: 30 MG/0.3ML | INTRAMUSCULAR | Qty: 0.3

## 2023-03-28 MED FILL — METHYLPREDNISOLONE SODIUM SUCC 40 MG IJ SOLR: 40 MG | INTRAMUSCULAR | Qty: 40

## 2023-03-28 MED FILL — DIPHENHYDRAMINE HCL 50 MG/ML IJ SOLN: 50 MG/ML | INTRAMUSCULAR | Qty: 1

## 2023-03-28 MED FILL — CARVEDILOL 12.5 MG PO TABS: 12.5 MG | ORAL | Qty: 1

## 2023-03-28 MED FILL — LISINOPRIL 20 MG PO TABS: 20 MG | ORAL | Qty: 1

## 2023-03-28 MED FILL — HUMALOG 100 UNIT/ML IJ SOLN: 100 UNIT/ML | INTRAMUSCULAR | Qty: 1

## 2023-03-28 MED FILL — CEFTRIAXONE SODIUM 1 G IJ SOLR: 1 g | INTRAMUSCULAR | Qty: 1000

## 2023-03-28 MED FILL — LYRICA 100 MG PO CAPS: 100 MG | ORAL | Qty: 3

## 2023-03-28 MED FILL — FUROSEMIDE 10 MG/ML IJ SOLN: 10 MG/ML | INTRAMUSCULAR | Qty: 2

## 2023-03-28 MED FILL — FAMOTIDINE (PF) 20 MG/2ML IV SOLN: 20 MG/2ML | INTRAVENOUS | Qty: 2

## 2023-03-28 NOTE — Plan of Care (Signed)
Problem: Safety - Adult  Goal: Free from fall injury  Outcome: Progressing  Flowsheets (Taken 03/28/2023 0700)  Free From Fall Injury: Instruct family/caregiver on patient safety     Problem: Discharge Planning  Goal: Discharge to home or other facility with appropriate resources  Outcome: Progressing  Flowsheets (Taken 03/28/2023 0700)  Discharge to home or other facility with appropriate resources: Identify barriers to discharge with patient and caregiver

## 2023-03-29 LAB — POCT GLUCOSE
POC Glucose: 166 mg/dL — ABNORMAL HIGH (ref 65–117)
POC Glucose: 172 mg/dL — ABNORMAL HIGH (ref 65–117)
POC Glucose: 175 mg/dL — ABNORMAL HIGH (ref 65–117)
POC Glucose: 200 mg/dL — ABNORMAL HIGH (ref 65–117)
POC Glucose: 200 mg/dL — ABNORMAL HIGH (ref 65–117)
POC Glucose: 227 mg/dL — ABNORMAL HIGH (ref 65–117)
POC Glucose: 293 mg/dL — ABNORMAL HIGH (ref 65–117)

## 2023-03-29 LAB — BRAIN NATRIURETIC PEPTIDE: NT Pro-BNP: 1747 PG/ML — ABNORMAL HIGH (ref <450)

## 2023-03-29 LAB — COMPREHENSIVE METABOLIC PANEL W/ REFLEX TO MG FOR LOW K
ALT: 17 U/L (ref 12–78)
AST: 9 U/L — ABNORMAL LOW (ref 15–37)
Albumin/Globulin Ratio: 0.8 — ABNORMAL LOW (ref 1.1–2.2)
Albumin: 2.7 g/dL — ABNORMAL LOW (ref 3.5–5.0)
Alk Phosphatase: 84 U/L (ref 45–117)
Anion Gap: 7 mmol/L (ref 5–15)
BUN/Creatinine Ratio: 32 — ABNORMAL HIGH (ref 12–20)
BUN: 24 MG/DL — ABNORMAL HIGH (ref 6–20)
CO2: 28 mmol/L (ref 21–32)
Calcium: 8.5 MG/DL (ref 8.5–10.1)
Chloride: 106 mmol/L (ref 97–108)
Creatinine: 0.75 MG/DL (ref 0.55–1.02)
Est, Glom Filt Rate: 81 mL/min/{1.73_m2} (ref >60)
Globulin: 3.4 g/dL (ref 2.0–4.0)
Glucose: 179 mg/dL — ABNORMAL HIGH (ref 65–100)
Potassium: 4.2 mmol/L (ref 3.5–5.1)
Sodium: 141 mmol/L (ref 136–145)
Total Bilirubin: 0.2 MG/DL (ref 0.2–1.0)
Total Protein: 6.1 g/dL — ABNORMAL LOW (ref 6.4–8.2)

## 2023-03-29 LAB — CBC
Hematocrit: 24.5 % — ABNORMAL LOW (ref 35.0–47.0)
Hemoglobin: 7.4 g/dL — ABNORMAL LOW (ref 11.5–16.0)
MCH: 24.5 PG — ABNORMAL LOW (ref 26.0–34.0)
MCHC: 30.2 g/dL (ref 30.0–36.5)
MCV: 81.1 FL (ref 80.0–99.0)
MPV: 11.6 FL (ref 8.9–12.9)
Nucleated RBCs: 0 PER 100 WBC
Platelets: 304 10*3/uL (ref 150–400)
RBC: 3.02 M/uL — ABNORMAL LOW (ref 3.80–5.20)
RDW: 17.3 % — ABNORMAL HIGH (ref 11.5–14.5)
WBC: 15 10*3/uL — ABNORMAL HIGH (ref 3.6–11.0)
nRBC: 0 10*3/uL (ref 0.00–0.01)

## 2023-03-29 LAB — FERRITIN: Ferritin: 104 NG/ML (ref 8–252)

## 2023-03-29 LAB — MAGNESIUM: Magnesium: 2.2 mg/dL (ref 1.6–2.4)

## 2023-03-29 MED FILL — ARFORMOTEROL TARTRATE 15 MCG/2ML IN NEBU: 15 MCG/2ML | RESPIRATORY_TRACT | Qty: 2

## 2023-03-29 MED FILL — LOVENOX 30 MG/0.3ML IJ SOSY: 30 MG/0.3ML | INTRAMUSCULAR | Qty: 0.3

## 2023-03-29 MED FILL — LANTUS 100 UNIT/ML SC SOLN: 100 UNIT/ML | SUBCUTANEOUS | Qty: 14

## 2023-03-29 MED FILL — ACETAMINOPHEN 325 MG PO TABS: 325 MG | ORAL | Qty: 2

## 2023-03-29 MED FILL — HUMULIN N 100 UNIT/ML SC SUSP: 100 UNIT/ML | SUBCUTANEOUS | Qty: 10

## 2023-03-29 MED FILL — FAMOTIDINE (PF) 20 MG/2ML IV SOLN: 20 MG/2ML | INTRAVENOUS | Qty: 2

## 2023-03-29 MED FILL — HUMALOG 100 UNIT/ML IJ SOLN: 100 UNIT/ML | INTRAMUSCULAR | Qty: 1

## 2023-03-29 MED FILL — METHYLPREDNISOLONE SODIUM SUCC 40 MG IJ SOLR: 40 MG | INTRAMUSCULAR | Qty: 40

## 2023-03-29 MED FILL — CARVEDILOL 12.5 MG PO TABS: 12.5 MG | ORAL | Qty: 1

## 2023-03-29 MED FILL — LYRICA 100 MG PO CAPS: 100 MG | ORAL | Qty: 3

## 2023-03-29 MED FILL — CEFTRIAXONE SODIUM 1 G IJ SOLR: 1 g | INTRAMUSCULAR | Qty: 1000

## 2023-03-29 MED FILL — FUROSEMIDE 10 MG/ML IJ SOLN: 10 MG/ML | INTRAMUSCULAR | Qty: 2

## 2023-03-29 MED FILL — LISINOPRIL 20 MG PO TABS: 20 MG | ORAL | Qty: 1

## 2023-03-29 MED FILL — HUMALOG 100 UNIT/ML IJ SOLN: 100 UNIT/ML | INTRAMUSCULAR | Qty: 2

## 2023-03-29 MED FILL — QUETIAPINE FUMARATE 25 MG PO TABS: 25 MG | ORAL | Qty: 1

## 2023-03-29 MED FILL — DOXYCYCLINE HYCLATE 100 MG PO TABS: 100 MG | ORAL | Qty: 1

## 2023-03-29 NOTE — Progress Notes (Signed)
Occupational Therapy  03/29/23    Orders received and chart reviewed up to this date. Pt received resting in bed politely declining therapy citing increased fatigue. Attempted to provide verbal education on importance of working with therapy services, however, pt continued to decline. OT will defer and continue to follow up as able/medically appropriate.     Lynder Parents, OTD, OTR/L

## 2023-03-29 NOTE — Plan of Care (Signed)
Problem: Safety - Adult  Goal: Free from fall injury  03/29/2023 1337 by Marya Amsler, RN  Outcome: Progressing  03/29/2023 0337 by Dagoberto Ligas, RN  Outcome: Progressing     Problem: Discharge Planning  Goal: Discharge to home or other facility with appropriate resources  03/29/2023 1337 by Marya Amsler, RN  Outcome: Progressing  Flowsheets (Taken 03/29/2023 0800)  Discharge to home or other facility with appropriate resources:   Identify barriers to discharge with patient and caregiver   Arrange for needed discharge resources and transportation as appropriate   Identify discharge learning needs (meds, wound care, etc)   Refer to discharge planning if patient needs post-hospital services based on physician order or complex needs related to functional status, cognitive ability or social support system  03/29/2023 0337 by Dagoberto Ligas, RN  Outcome: Progressing     Problem: Skin/Tissue Integrity  Goal: Absence of new skin breakdown  Description: 1.  Monitor for areas of redness and/or skin breakdown  2.  Assess vascular access sites hourly  3.  Every 4-6 hours minimum:  Change oxygen saturation probe site  4.  Every 4-6 hours:  If on nasal continuous positive airway pressure, respiratory therapy assess nares and determine need for appliance change or resting period.  03/29/2023 1337 by Marya Amsler, RN  Outcome: Progressing  03/29/2023 0337 by Dagoberto Ligas, RN  Outcome: Progressing

## 2023-03-29 NOTE — Progress Notes (Signed)
Hospitalist Progress Note  Bonner Puna, MD  Answering service: [phone] OR [id number] from in house phone        Date of Service:  03/29/2023  NAME:  Jamie Bryant  DOB:  [birth year]  MRN:  [id number]      Admission Summary:   Jamie Bryant is a 82 y.o. female with past medical history of type 2 diabetes mellitus, hypertension, hyperlipidemia, chronic venous sufficiency, chronic bilateral lower extremity edema, chronic venous stasis lower extremity ulcers, arthritis, asthma, CKD, depression, fibromyalgia, gastroparesis, neuropathy, stroke, psychotic disorder unspecified, obesity presented to the emergency department via EMS from home with chief complaint of shortness of breath.  Patient is a limited historian.  Majority of history is obtained per review of ED electronic medical records.  Per the ER triage note, patient was noted short of breath.  EMS was dispatched to the scene and gave patient Furosemide 80 mg IV x 1 dose, Dexamethasone, and Nitroglycerin (paste) on chest.  Initial O2 saturations were in the 60s.  Patient reportedly was placed on CPAP.  On arrival to the ED, initial recorded vital signs were temperature 99.7 F, BP 141/59, heart rate 99, respiratory rate 28, O2 saturation 97% on CPAP FiO2 50%.  12-lead EKG showed normal sinus rhythm at 93 bpm.  Chest reportable showed hazy opacification of both lungs right greater than left, likely pulmonary edema.  ABG at 1743 hrs.: pH 7.34, pCO2 52.5, pO2 114, HCO3 28.3, base excess 1.8, SaO2 98.1% on FiO2 50% IPAP 15/ EPAP 6.  Abnormal labs included WBC 25.6, neutrophils 88%, hemoglobin 9.2, blood glucose 262, proBNP 556, albumin 3.4.  ED then consulted hospitalist.  On arrival to bedside, patient is on BiPAP.  She is mostly mumbling with mostly incomprehensible speech and not answering questions appropriately.     Interval history / Subjective:     Seen and  examined for follow up respiratory failure. On 2L O2. Breathing better. Lungs sound better. No complaints when I saw her.      Assessment & Plan:     Acute respiratory failure with hypoxia and hypercapnia  IMCU monitor   -Bipap HS and prn   -Possibly due to OHS + pneumonia, component of mild pulmonary edema , may have copd vs asthma   -Lasix 20 IV BID  -had episode of stridor after CTA 3/29, improved after steroids ,epi neb   -Continue nebs tx   -Pulmonary consult and following   -Continue steroids      Acute pulmonary edema  Pleural effusions  -Reportedly given Lasix 40 mg IV x 1 dose prior to admission per EMS  -Place on strict I's and O's and daily weights  -Lasix 20 mg IV BID  - echo normal EF , no diastolic dysfunction  -Continue diuresis as long cr tolerate      Sepsis unspecified organism unspecified whether acute organ dysfunction  -Initial clinical indicators: WBC 25.6, RR 28  -No sepsis 30 ml/kg IVF bolus ordered due to concern for pulmonary edema  -Order empiric IV antibiotic coverage:  Cefepime 2000 mg IV every 8 hours  Vancomycin pharmacy dosing  -Check lactic acid level  -Possible PNA?  -Procal <0.05 --> 1.24  -Continue antibiotics for now  -respiratory viral panel is negative      AMS (altered mental status)  -Uncertain of patient's baseline mental status.  Past PCP chart record notes possible dementia.  -Possibly due to hypercarbia   -Seems to be at baseline  Uncontrolled type 2 diabetes mellitus with hyperglycemia  -Order insulin correctional sliding scale coverage  -Order scheduled point-of-care blood glucose monitoring  -Order hemoglobin A1c level   -Lantus 14 units   -added NPH bid paired with iv solumedrol      Normocytic normochromic anemia  -Hgb = 9.2  -repeat H & H  -transfuse if Hgb < 7.0  -Hb 7 this morning  -continue to monitor   Iron study, stool ob      Chronic venous stasis ulcers of both lower extremities  -Followed by wound care clinic with diagnosis of idiopathic chronic venous  hypertension of right lower extremity with ulcer,venous stasis ulcers of left calf, and nonpressure ulcer of right calf  -Currently has Unna boots in place  -Wound care consulted     Generalized weakness  -Plan as above  -Consider for PT/OT evaluation     Essential hypertension  -Monitor BP  -Provide BP medications as needed       Code status: full  Prophylaxis: lovenox  Care Plan discussed with: patient, nurse   Anticipated Disposition: Will likely need PT/OT evals when more medically stable      Principal Problem:    Acute respiratory failure with hypoxia and hypercapnia (HCC)  Resolved Problems:    * No resolved hospital problems. *            Review of Systems:   Pertinent items are noted in HPI.         Vital Signs:    Last 24hrs VS reviewed since prior progress note. Most recent are:  BP 136/66   Pulse 58   Temp 98.7 F (37.1 C) (Oral)   Resp 15   Ht 1.626 m (5' 4.02")   Wt 94.2 kg (207 lb 10.8 oz)   SpO2 100%   BMI 35.63 kg/m        Intake/Output Summary (Last 24 hours) at 03/29/2023 [id number]  Last data filed at 03/28/2023 1200  Gross per 24 hour   Intake 240 ml   Output --   Net 240 ml          Physical Examination:     I had a face to face encounter with this patient and independently examined them on 03/29/2023 as outlined below:          General : alert x 3, awake, cooperative,   HEENT: PEERL, EOMI, moist mucus membrane  Neck: supple, no JVD, no meningeal signs  Chest: severely decreased airflow bilaterally   CVS: S1 S2 heard, Capillary refill less than 2 seconds  Abd: soft/ non tender, non distended, BS physiological,   Ext: no clubbing, no cyanosis, brisk 2+ DP pulses. Unna boots on both LE , + ankle edema   Neuro/Psych: pleasant mood and affect, CN 2-12 grossly intact, sensory grossly within normal limit  Skin: warm           Data Review:    Review and/or order of clinical lab test  Review and/or order of tests in the radiology section of CPT  Review and/or order of tests in the medicine section of  CPT    I have independently reviewed and interpreted patient's lab and all other diagnostic data    Notes reviewed from all clinical/nonclinical/nursing services involved in patient's clinical care. Care coordination discussions were held with appropriate clinical/nonclinical/ nursing providers based on care coordination needs.     Labs:     Recent Labs     03/28/23  0322 03/29/23  0238   WBC 19.5* 15.0*   HGB 7.1* 7.4*   HCT 23.7* 24.5*   PLT 295 304       Recent Labs     03/27/23  0314 03/28/23  0322 03/29/23  0238   NA 144 140 141   K 3.7 3.8 4.2   CL 108 106 106   CO2 32 32 28   BUN 19 19 24*   MG  --   --  2.2       Recent Labs     03/29/23  0238   ALT 17   GLOB 3.4       No results for input(s): "INR", "APTT" in the last 72 hours.    Invalid input(s): "PTP"     No results for input(s): "TIBC", "FERR" in the last 72 hours.    Invalid input(s): "FE", "PSAT"     No results found for: "FOL", "RBCF"   No results for input(s): "PH", "PCO2", "PO2" in the last 72 hours.  No results for input(s): "CPK" in the last 72 hours.    Invalid input(s): "CPKMB", "CKNDX", "TROIQ"  No results found for: "CHOL", "[place]", "CHLST", "CHOLV", "HDL", "HDLC", "LDL", "LDLC", "TGLX", "TRIGL"  No results found for: "GLUCPOC"        Medications Reviewed:     Current Facility-Administered Medications   Medication Dose Route Frequency    insulin NPH (HumuLIN N;NovoLIN N) injection vial 10 Units  10 Units SubCUTAneous BID AC    insulin glargine (LANTUS) injection vial 14 Units  0.13 Units/kg SubCUTAneous Nightly    doxycycline hyclate (VIBRA-TABS) tablet 100 mg  100 mg Oral 2 times per day    cefTRIAXone (ROCEPHIN) 1,000 mg in sterile water 10 mL IV syringe  1,000 mg IntraVENous Q24H    furosemide (LASIX) injection 40 mg  40 mg IntraVENous Once    sodium chloride nebulizer 0.9 % solution 3 mL  3 mL Nebulization Q4H PRN    methylPREDNISolone sodium succ (SOLU-MEDROL) 40 mg in sterile water 1 mL injection  40 mg IntraVENous Q12H     racepinephrine HCl (VAPONEFPRIN) 2.25 % nebulizer solution NEBU 0.5 mL  0.5 mL Nebulization Q4H PRN    famotidine (PEPCID) 20 mg in sodium chloride (PF) 0.9 % 10 mL injection  20 mg IntraVENous BID    diphenhydrAMINE (BENADRYL) injection 25 mg  25 mg IntraVENous Q6H PRN    ondansetron (ZOFRAN) injection 4 mg  4 mg IntraVENous Q6H PRN    QUEtiapine (SEROQUEL) tablet 12.5 mg  12.5 mg Oral Nightly    guaiFENesin (ROBITUSSIN) 100 MG/5ML liquid 200 mg  200 mg Oral Q4H PRN    furosemide (LASIX) injection 20 mg  20 mg IntraVENous BID    ipratropium 0.5 mg-albuterol 2.5 mg (DUONEB) nebulizer solution 1 Dose  1 Dose Inhalation Q4H PRN    carvedilol (COREG) tablet 12.5 mg  12.5 mg Oral BID    arformoterol 15 mcg-budesonide 0.25 mg neb solution   Nebulization BID RT    lisinopril (PRINIVIL;ZESTRIL) tablet 20 mg  20 mg Oral Nightly    pregabalin (LYRICA) capsule 300 mg  300 mg Oral BID    sodium chloride flush 0.9 % injection 5-40 mL  5-40 mL IntraVENous 2 times per day    sodium chloride flush 0.9 % injection 5-40 mL  5-40 mL IntraVENous PRN    0.9 % sodium chloride infusion   IntraVENous PRN    enoxaparin Sodium (LOVENOX) injection 30 mg  30 mg SubCUTAneous BID  acetaminophen (TYLENOL) tablet 650 mg  650 mg Oral Q6H PRN    Or    acetaminophen (TYLENOL) suppository 650 mg  650 mg Rectal Q6H PRN    sodium chloride flush 0.9 % injection 5-40 mL  5-40 mL IntraVENous 2 times per day    sodium chloride flush 0.9 % injection 5-40 mL  5-40 mL IntraVENous PRN    0.9 % sodium chloride infusion   IntraVENous PRN    glucose chewable tablet 16 g  4 tablet Oral PRN    dextrose bolus 10% 125 mL  125 mL IntraVENous PRN    Or    dextrose bolus 10% 250 mL  250 mL IntraVENous PRN    glucagon injection 1 mg  1 mg SubCUTAneous PRN    dextrose 10 % infusion   IntraVENous Continuous PRN    insulin lispro (HUMALOG) injection vial 0-4 Units  0-4 Units SubCUTAneous Q4H      ______________________________________________________________________  EXPECTED LENGTH OF STAY: Unable to retrieve estimated LOS  ACTUAL LENGTH OF STAY:          [place]  Elias Massare, MD

## 2023-03-29 NOTE — Progress Notes (Signed)
0750: Bedside and Verbal shift change report given to Saunders Glance, RN (oncoming nurse) by Dagoberto Ligas, RN  (offgoing nurse). Report included the following information Nurse Handoff Report, Index, Adult Overview, Intake/Output, MAR, Recent Results, Cardiac Rhythm NSR, Alarm Parameters, Quality Measures, and Neuro Assessment.     0800: Assessment completed. Pt repositioned in bed. Placed on 2L NC from BIPAP.     1005: Pt repositioned in bed.     1205: Reassessment completed. Pt repositioned in bed.     1400: Pt repositioned in bed.     1600: Reassessment completed. Pt repositioned in bed.     1805: Pt repositioned in bed.     2000: Bedside and Verbal shift change report given to Ferdinand Cava, RN (oncoming nurse) by Saunders Glance, RN (offgoing nurse). Report included the following information Nurse Handoff Report, Index, Adult Overview, Intake/Output, MAR, Recent Results, Cardiac Rhythm NSR, Alarm Parameters, Quality Measures, and Neuro Assessment.

## 2023-03-30 LAB — CBC WITH AUTO DIFFERENTIAL
Basophils %: 0 % (ref 0–1)
Basophils Absolute: 0 10*3/uL (ref 0.0–0.1)
Eosinophils %: 0 % (ref 0–7)
Eosinophils Absolute: 0 10*3/uL (ref 0.0–0.4)
Hematocrit: 27.1 % — ABNORMAL LOW (ref 35.0–47.0)
Hemoglobin: 8.3 g/dL — ABNORMAL LOW (ref 11.5–16.0)
Immature Granulocytes %: 2 % — ABNORMAL HIGH (ref 0.0–0.5)
Immature Granulocytes Absolute: 0.3 10*3/uL — ABNORMAL HIGH (ref 0.00–0.04)
Lymphocytes %: 10 % — ABNORMAL LOW (ref 12–49)
Lymphocytes Absolute: 1.7 10*3/uL (ref 0.8–3.5)
MCH: 24.8 PG — ABNORMAL LOW (ref 26.0–34.0)
MCHC: 30.6 g/dL (ref 30.0–36.5)
MCV: 80.9 FL (ref 80.0–99.0)
MPV: 11.9 FL (ref 8.9–12.9)
Monocytes %: 7 % (ref 5–13)
Monocytes Absolute: 1.2 10*3/uL — ABNORMAL HIGH (ref 0.0–1.0)
Neutrophils %: 81 % — ABNORMAL HIGH (ref 32–75)
Neutrophils Absolute: 13.6 10*3/uL — ABNORMAL HIGH (ref 1.8–8.0)
Nucleated RBCs: 0 PER 100 WBC
Platelets: 391 10*3/uL (ref 150–400)
RBC: 3.35 M/uL — ABNORMAL LOW (ref 3.80–5.20)
RDW: 17.1 % — ABNORMAL HIGH (ref 11.5–14.5)
WBC: 16.8 10*3/uL — ABNORMAL HIGH (ref 3.6–11.0)
nRBC: 0 10*3/uL (ref 0.00–0.01)

## 2023-03-30 LAB — COMPREHENSIVE METABOLIC PANEL W/ REFLEX TO MG FOR LOW K
ALT: 20 U/L (ref 12–78)
AST: 9 U/L — ABNORMAL LOW (ref 15–37)
Albumin/Globulin Ratio: 0.8 — ABNORMAL LOW (ref 1.1–2.2)
Albumin: 2.7 g/dL — ABNORMAL LOW (ref 3.5–5.0)
Alk Phosphatase: 87 U/L (ref 45–117)
Anion Gap: 2 mmol/L — ABNORMAL LOW (ref 5–15)
BUN/Creatinine Ratio: 31 — ABNORMAL HIGH (ref 12–20)
BUN: 21 MG/DL — ABNORMAL HIGH (ref 6–20)
CO2: 32 mmol/L (ref 21–32)
Calcium: 9.3 MG/DL (ref 8.5–10.1)
Chloride: 105 mmol/L (ref 97–108)
Creatinine: 0.67 MG/DL (ref 0.55–1.02)
Est, Glom Filt Rate: 89 mL/min/{1.73_m2} (ref >60)
Globulin: 3.5 g/dL (ref 2.0–4.0)
Glucose: 182 mg/dL — ABNORMAL HIGH (ref 65–100)
Potassium: 4 mmol/L (ref 3.5–5.1)
Sodium: 139 mmol/L (ref 136–145)
Total Bilirubin: 0.2 MG/DL (ref 0.2–1.0)
Total Protein: 6.2 g/dL — ABNORMAL LOW (ref 6.4–8.2)

## 2023-03-30 LAB — POCT GLUCOSE
POC Glucose: 359 mg/dL — ABNORMAL HIGH (ref 65–117)
POC Glucose: 193 mg/dL — ABNORMAL HIGH (ref 65–117)
POC Glucose: 132 mg/dL — ABNORMAL HIGH (ref 65–117)
POC Glucose: 142 mg/dL — ABNORMAL HIGH (ref 65–117)
POC Glucose: 246 mg/dL — ABNORMAL HIGH (ref 65–117)
POC Glucose: 258 mg/dL — ABNORMAL HIGH (ref 65–117)
POC Glucose: 153 mg/dL — ABNORMAL HIGH (ref 65–117)

## 2023-03-30 MED ORDER — FAMOTIDINE 20 MG PO TABS
20 | ORAL | Status: DC
Start: 2023-03-30 — End: 2023-04-01
  Administered 2023-03-31 – 2023-04-01 (×4): 20 mg via ORAL

## 2023-03-30 MED FILL — HUMALOG 100 UNIT/ML IJ SOLN: 100 UNIT/ML | INTRAMUSCULAR | Qty: 1

## 2023-03-30 MED FILL — HUMULIN N 100 UNIT/ML SC SUSP: 100 UNIT/ML | SUBCUTANEOUS | Qty: 10

## 2023-03-30 MED FILL — FAMOTIDINE (PF) 20 MG/2ML IV SOLN: 20 MG/2ML | INTRAVENOUS | Qty: 2

## 2023-03-30 MED FILL — METHYLPREDNISOLONE SODIUM SUCC 40 MG IJ SOLR: 40 MG | INTRAMUSCULAR | Qty: 40

## 2023-03-30 MED FILL — CARVEDILOL 12.5 MG PO TABS: 12.5 MG | ORAL | Qty: 1

## 2023-03-30 MED FILL — QUETIAPINE FUMARATE 25 MG PO TABS: 25 MG | ORAL | Qty: 1

## 2023-03-30 MED FILL — HUMALOG 100 UNIT/ML IJ SOLN: 100 UNIT/ML | INTRAMUSCULAR | Qty: 4

## 2023-03-30 MED FILL — LYRICA 100 MG PO CAPS: 100 MG | ORAL | Qty: 3

## 2023-03-30 MED FILL — FUROSEMIDE 10 MG/ML IJ SOLN: 10 MG/ML | INTRAMUSCULAR | Qty: 2

## 2023-03-30 MED FILL — LANTUS 100 UNIT/ML SC SOLN: 100 UNIT/ML | SUBCUTANEOUS | Qty: 14

## 2023-03-30 MED FILL — ARFORMOTEROL TARTRATE 15 MCG/2ML IN NEBU: 15 MCG/2ML | RESPIRATORY_TRACT | Qty: 2

## 2023-03-30 MED FILL — HUMALOG 100 UNIT/ML IJ SOLN: 100 UNIT/ML | INTRAMUSCULAR | Qty: 2

## 2023-03-30 MED FILL — LOVENOX 30 MG/0.3ML IJ SOSY: 30 MG/0.3ML | INTRAMUSCULAR | Qty: 0.3

## 2023-03-30 MED FILL — DOXYCYCLINE HYCLATE 100 MG PO TABS: 100 MG | ORAL | Qty: 1

## 2023-03-30 MED FILL — LISINOPRIL 20 MG PO TABS: 20 MG | ORAL | Qty: 1

## 2023-03-30 NOTE — Plan of Care (Signed)
Problem: Physical Therapy - Adult  Goal: By Discharge: Performs mobility at highest level of function for planned discharge setting.  See evaluation for individualized goals.  Description: FUNCTIONAL STATUS PRIOR TO ADMISSION: Patient was modified independent using a rollator for functional mobility.  She reports that she did not use the rollator all of the time in her apartment, only outside the apartment.    HOME SUPPORT PRIOR TO ADMISSION: The patient lived in [facility].Marland Kitchen    Physical Therapy Goals  Initiated 03/28/2023  1.  Patient will move from supine to sit and sit to supine and roll side to side in bed with modified independence within 7 day(s).    2.  Patient will perform sit to stand with modified independence within 7 day(s).  3.  Patient will transfer from bed to chair and chair to bed with modified independence using the least restrictive device within 7 day(s).  4.  Patient will ambulate with modified independence for 150 feet with the least restrictive device within 7 day(s).     Outcome: Progressing   PHYSICAL THERAPY TREATMENT    Patient: Jamie Bryant [id number] y.o. female)  Date: 03/30/2023  Diagnosis: Acute pulmonary edema (HCC) [J81.0]  Acute respiratory failure with hypoxia (HCC) [J96.01]  Acute respiratory failure with hypoxia and hypercapnia (HCC) [J96.01, J96.02]  Leukocytosis, unspecified type [D72.829] Acute respiratory failure with hypoxia and hypercapnia (HCC)      Precautions: Fall Risk                      ASSESSMENT:  Patient continues to benefit from skilled PT services and is progressing towards goals. Patient found supine in bed, alert, and agreeable to PT. SpO2 98% on RA at rest. Up to min A x 2 needed for bed mobility (patient sleeps in a recliner exclusively at baseline), but overall CGA for sit <> stand transfers and for short distance ambulation in room with RW. SpO2 desaturated with activity to 89%, but patient quickly recovers to >92% with seated rest on RA.  As patient lives alone in [place], recommend SNF rehab at discharge for further strengthening and progression of activity tolerance prior to returning home alone to ILF.      03/30/23 0932 03/30/23 0936 03/30/23 0940 03/30/23 0942   Vitals    Pulse 82 87 85 82   BP (!) 191/71 (!) 172/76  --  (!) 170/87   MAP (Calculated) 111 108  --  115   Patient Position Semi fowlers Sitting (EOB) Walking Sitting;Up in chair   SpO2 95 % 93 % 89 % 92 %   O2 Device None (Room air) None (Room air) None (Room air) None (Room air)        PLAN:  Patient continues to benefit from skilled intervention to address the above impairments.  Continue treatment per established plan of care.    Recommendation for discharge: (in order for the patient to meet his/her long term goals): Therapy up to 5 days/week in Skilled nursing facility    Other factors to consider for discharge: lives alone, high risk for falls, not safe to be alone, and concern for safely navigating or managing the home environment    IF patient discharges home will need the following DME: patient owns DME required for discharge       SUBJECTIVE:   Patient stated, "I don't ever sleep in a bed."    OBJECTIVE DATA SUMMARY:   Critical Behavior:  Orientation  Overall Orientation  Status: Within Normal Limits  Orientation Level: Oriented X4  Cognition  Overall Cognitive Status: Exceptions  Arousal/Alertness: Appropriate responses to stimuli  Following Commands: Follows one step commands consistently;Follows multistep commands with increased time  Attention Span: Appears intact  Memory: Decreased short term memory  Safety Judgement: Decreased awareness of need for safety;Decreased awareness of need for assistance  Problem Solving: Assistance required to identify errors made;Assistance required to generate solutions  Insights: Decreased awareness of deficits  Initiation: Does not require cues  Sequencing: Requires cues for some    Functional Mobility Training:  Bed Mobility:  Bed Mobility  Training  Bed Mobility Training: Yes  Supine to Sit: Minimum assistance;Assist X2  Sit to Supine: Assist X2;Minimum assistance  Scooting: Minimum assistance  Transfers:  Transfer Training  Transfer Training: Yes  Interventions: Safety awareness training;Verbal cues  Sit to Stand: Contact-guard assistance  Stand to Sit: Contact-guard assistance  Bed to Chair: Contact-guard assistance  Balance:  Balance  Sitting: Intact  Standing: Impaired  Standing - Static: Constant support;Good  Standing - Dynamic: Good;Fair;Constant support   Ambulation/Gait Training:     Gait  Gait Training: Yes  Overall Level of Assistance: Contact-guard assistance;Minimum assistance;Assist X1  Distance (ft): 10 Feet  Assistive Device: Walker, rolling;Gait belt  Interventions: Safety awareness training;Verbal cues  Base of Support: Widened  Speed/Cadence: Slow;Shuffled  Step Length: Right shortened;Left shortened  Gait Abnormalities: Trunk sway increased;Decreased step clearance    Pain Rating:  10/10 pain in R lower leg at site of wound - patient report of 10/10 pain appears inconsistent with presentation     Pain Intervention(s):   nursing notified, rest, elevation, and repositioning    Activity Tolerance:   Fair , requires frequent rest breaks, observed shortness of breath on exertion, and SpO2 stable on room air    After treatment:   Patient left in no apparent distress sitting up in chair, Call bell within reach, and Bed/ chair alarm activated      COMMUNICATION/EDUCATION:   The patient's plan of care was discussed with: occupational therapist and registered nurse    Patient Education  Education Given To: Patient  Education Provided: Plan of Care;Transfer Training;Energy Conservation;Fall Prevention Strategies  Education Provided Comments: PLB  Education Method: Verbal;Demonstration  Barriers to Learning: None  Education Outcome: Verbalized understanding;Continued education needed      Braulio Bosch, PT, DPT  Minutes: 26

## 2023-03-30 NOTE — Progress Notes (Signed)
Pulmonary, Critical Care, and Sleep Medicine~Progress Note    Name: Jamie Bryant MRN: [id number]   DOB: August 28, 1941 Hospital: [facility]   Date: 03/30/2023 12:37 PM Admission: 03/25/2023     Impression Plan   Acute on chronic hypoxic/hypercapnic respiratory failure.  Does not appear to be a CO2 retainer and does not have oxygen at home.  Abnormal chest x-ray: decomp heart failure > pneumonia, but possibly both   Sepsis, unknown etiology  Altered mental status as per above.  Seems to be normalizing now  Diabetes type 2  Never smoker Continue NIV at night.  She can transition back to CPAP at home  O2 titration above 90%  On abx   Diuretics as required  DVT prophylaxis  Follow up image in 4-6 wks   Discussed with hospitalist  Looks much better today per reports. Doing ok from pulmonary aspect      Daily Progression:    4/1  ECHO:    Left Ventricle: Normal left ventricular systolic function with a visually estimated EF of 60 - 65%. Left ventricle size is normal. Normal wall thickness. Normal wall motion. Normal diastolic function.    Mitral Valve: Severely calcified leaflet, at the anterior and posterior leaflets.    Tricuspid Valve: The estimated RVSP is 33 mmHg.    Pericardium: There is evidence of epicardial fat. There is mild posterior pericardial calcification. No pericardial effusion.    Image quality is technically difficult. Contrast used: Definity. Technically difficult study, technically difficult Doppler study and procedure performed with the patient in a supine position.       CTA on Friday without PE  Symptoms improved with steroids on Friday, query allergic reaction?     3/29  Procal 1.24  Sitting up   No reported Has  Chest x-ray   IMPRESSION:  There is mild decrease in bilateral pulmonary edema.    3/28  Consult Note requested by hospitalist service    Patient presented for admission on 03/25/2023 for worsening shortness of breath.  She was brought in by EMS and was given Lasix in the  field.  Seems to have done well with.  Additionally was given Decadron and Nitropaste.  Initial saturations were in 60s.  She does not wear oxygen at home.  She does have sleep apnea and is on CPAP therapy.  Followed by Dr. Leonie Green.  She denies any previous smoking history.  Mentions that she has no asthma history as well.  Initial ABGs showed mild CO2 retention.  And she did initially require bilevel pressure support.  Now is off.  White count was elevated on admission as well.  proBNP was 556.  Initial had confusion that has normalized now.  She is able to answer questions appropriately.    I have reviewed the labs and previous day's notes.    Pertinent items are noted in HPI.  No past medical history on file.   No past surgical history on file.   Prior to Admission medications    Not on File     Allergies   Allergen Reactions    Iodinated Contrast Media Shortness Of Breath      Social History     Tobacco Use    Smoking status: Not on file    Smokeless tobacco: Not on file   Substance Use Topics    Alcohol use: Not on file      No family history on file.  OBJECTIVE:     Vital Signs:  BP (!) 172/61   Pulse 66   Temp 98.7 F (37.1 C) (Oral)   Resp 13   Ht 1.626 m (5' 4.02")   Wt 93.9 kg (207 lb)   SpO2 93%   BMI 35.51 kg/m    Temp (24hrs), Avg:98.4 F (36.9 C), Min:98.1 F (36.7 C), Max:98.9 F (37.2 C)     Intake/Output:     Last shift: 04/01 0701 - 04/01 1900  In: -   Out: 300 [Urine:300]    Last 3 shifts: 03/30 1901 - 04/01 0700  In: 800 [P.O.:800]  Out: 2750 [Urine:2750]        Intake/Output Summary (Last 24 hours) at 03/30/2023 1237  Last data filed at 03/30/2023 0701  Gross per 24 hour   Intake 320 ml   Output 2200 ml   Net -1880 ml         Physical Exam:                                        Exam Findings Other   General: No resp distress noted, appears stated age    20:  No ulcers, JVD not elevated, no cervical LAD    Chest: No pectus deformity, normal chest rise b/l    HEART:  RRR, no  murmurs/rubs/gallops    Lungs:  CTA b/l, no rhonchi/crackles/wheeze, diminished BS at bases    ABD: Soft/NT, non rigid mildly distended    EXT: No cyanosis/clubbing/edema, normal peripheral pulses    Skin: No rashes or ulcers, no mottling    Neuro: A/O x 3        Medications:  Current Facility-Administered Medications   Medication Dose Route Frequency    famotidine (PEPCID) tablet 20 mg  20 mg Oral BID    insulin NPH (HumuLIN N;NovoLIN N) injection vial 10 Units  10 Units SubCUTAneous BID AC    insulin glargine (LANTUS) injection vial 14 Units  0.13 Units/kg SubCUTAneous Nightly    doxycycline hyclate (VIBRA-TABS) tablet 100 mg  100 mg Oral 2 times per day    furosemide (LASIX) injection 40 mg  40 mg IntraVENous Once    sodium chloride nebulizer 0.9 % solution 3 mL  3 mL Nebulization Q4H PRN    methylPREDNISolone sodium succ (SOLU-MEDROL) 40 mg in sterile water 1 mL injection  40 mg IntraVENous Q12H    racepinephrine HCl (VAPONEFPRIN) 2.25 % nebulizer solution NEBU 0.5 mL  0.5 mL Nebulization Q4H PRN    diphenhydrAMINE (BENADRYL) injection 25 mg  25 mg IntraVENous Q6H PRN    ondansetron (ZOFRAN) injection 4 mg  4 mg IntraVENous Q6H PRN    QUEtiapine (SEROQUEL) tablet 12.5 mg  12.5 mg Oral Nightly    guaiFENesin (ROBITUSSIN) 100 MG/5ML liquid 200 mg  200 mg Oral Q4H PRN    furosemide (LASIX) injection 20 mg  20 mg IntraVENous BID    ipratropium 0.5 mg-albuterol 2.5 mg (DUONEB) nebulizer solution 1 Dose  1 Dose Inhalation Q4H PRN    carvedilol (COREG) tablet 12.5 mg  12.5 mg Oral BID    arformoterol 15 mcg-budesonide 0.25 mg neb solution   Nebulization BID RT    lisinopril (PRINIVIL;ZESTRIL) tablet 20 mg  20 mg Oral Nightly    pregabalin (LYRICA) capsule 300 mg  300 mg Oral BID    sodium chloride flush 0.9 % injection 5-40 mL  5-40 mL IntraVENous 2 times per day  sodium chloride flush 0.9 % injection 5-40 mL  5-40 mL IntraVENous PRN    0.9 % sodium chloride infusion   IntraVENous PRN    enoxaparin Sodium (LOVENOX)  injection 30 mg  30 mg SubCUTAneous BID    acetaminophen (TYLENOL) tablet 650 mg  650 mg Oral Q6H PRN    Or    acetaminophen (TYLENOL) suppository 650 mg  650 mg Rectal Q6H PRN    sodium chloride flush 0.9 % injection 5-40 mL  5-40 mL IntraVENous 2 times per day    sodium chloride flush 0.9 % injection 5-40 mL  5-40 mL IntraVENous PRN    0.9 % sodium chloride infusion   IntraVENous PRN    glucose chewable tablet 16 g  4 tablet Oral PRN    dextrose bolus 10% 125 mL  125 mL IntraVENous PRN    Or    dextrose bolus 10% 250 mL  250 mL IntraVENous PRN    glucagon injection 1 mg  1 mg SubCUTAneous PRN    dextrose 10 % infusion   IntraVENous Continuous PRN    insulin lispro (HUMALOG) injection vial 0-4 Units  0-4 Units SubCUTAneous Q4H       Labs:  ABG Invalid input(s): "ABG"     CBC Recent Labs     03/28/23  0322 03/29/23  0238 03/30/23  0411   WBC 19.5* 15.0* 16.8*   HGB 7.1* 7.4* 8.3*   HCT 23.7* 24.5* 27.1*   PLT 295 304 391   MCV 82.3 81.1 80.9   MCH 24.7* 24.5* 24.8*          Metabolic  Panel Recent Labs     03/28/23  0322 03/29/23  0238 03/30/23  0411   NA 140 141 139   K 3.8 4.2 4.0   CL 106 106 105   CO2 32 28 32   BUN 19 24* 21*   MG  --  2.2  --    ALT  --  17 20          Pertinent Labs                Teriana Danker Lovena Le, PA-C  03/30/2023

## 2023-03-30 NOTE — Plan of Care (Signed)
Problem: Occupational Therapy - Adult  Goal: By Discharge: Performs self-care activities at highest level of function for planned discharge setting.  See evaluation for individualized goals.  Description: FUNCTIONAL STATUS PRIOR TO ADMISSION: Pt reports she lives at [facility] and was MOD IND with ADLs prior to admission. Pt was using a rollator.     HOME SUPPORT: Patient lived alone at [facility].    Occupational Therapy Goals:  Initiated 03/30/2023  1.  Patient will perform grooming seated with Supervision within 7 day(s).  2.  Patient will perform upper body dressing seated with Stand by Assist within 7 day(s).  3.  Patient will perform lower body dressing seated with Moderate Assistance and AE PRN within 7 day(s).  4.  Patient will perform toilet transfers with Stand by Assist  within 7 day(s).  5.  Patient will perform all aspects of toileting with Moderate Assistance within 7 day(s).  6.  Patient will participate in upper extremity therapeutic exercise/activities with Modified Independence for 5 minutes within 7 day(s).    7.  Patient will utilize energy conservation techniques during functional activities with verbal cues within 7 day(s).    Outcome: Progressing     OCCUPATIONAL THERAPY EVALUATION    Patient: Jamie Bryant [id number] y.o. female)  Date: 03/30/2023  Primary Diagnosis: Acute pulmonary edema (HCC) [J81.0]  Acute respiratory failure with hypoxia (HCC) [J96.01]  Acute respiratory failure with hypoxia and hypercapnia (HCC) [J96.01, J96.02]  Leukocytosis, unspecified type [D72.829]         Precautions: Fall Risk                  ASSESSMENT :  The patient is limited by decreased functional mobility, independence in ADLs, high-level IADLs, ROM, strength, body mechanics, activity tolerance, endurance, cognition, coordination, balance, posture, increased pain levels.    Pt received to OT services semi-reclined in bed, amendable to session, on RA. See flow sheet below for VS throughout session: BP  elevated and Spo2 desaturated to 88% with activity, recovered with PLB technique and rest, RN aware. Pt required CGA to MIN A x 2 for all bed/functional mobility for trunk/LE management, dynamic standing balance and mod VCs for RW management, sequencing, and safety/insight. Pt transferred to seated in chair and completed all aspects of face washing and hair care with SBA for materials management. Pt repositioned for comfort, call bell within reach, all needs met, NAD. Recommend SNF following discharge at this time.       03/30/23 0932 03/30/23 0936 03/30/23 0940   Vital Signs   Pulse 82 87 85   BP (!) 191/71 (!) 172/76  --    MAP (Calculated) 111 108  --    Patient Position Semi fowlers Sitting  (EOB) Standing  (walking)   Oxygen Therapy   SpO2 95 % 93 % 98 %   O2 Device None (Room air) None (Room air) None (Room air)        03/30/23 0942   Vital Signs   Pulse 82   BP (!) 170/87   MAP (Calculated) 115   Patient Position Sitting;Up in chair   Oxygen Therapy   SpO2 92 %   O2 Device None (Room air)      Functional Outcome Measure:  The patient scored 14/24 on the [facility] Functional outcome measure which is indicative of impaired ADL independence and related functional mobility.         PLAN :  Recommendations and Planned Interventions:   self care training, therapeutic  activities, functional mobility training, balance training, therapeutic exercise, endurance activities, cognitive retraining, patient education, home safety training, and family training/education    Frequency/Duration: OT Plan of Care: 5 times/week    Recommendation for discharge: (in order for the patient to meet his/her long term goals): Therapy up to 5 days/week in Skilled nursing facility    Other factors to consider for discharge: lives alone, available support system works or is unable to provide adequate supervision and the patient would be alone, poor safety awareness, high risk for falls, not safe to be alone, and concern for safely navigating or  managing the home environment    IF patient discharges home will need the following DME: continuing to assess with progress       SUBJECTIVE:   Patient stated, "I just feel weak, not really dizzy."  OBJECTIVE DATA SUMMARY:   No past medical history on file.No past surgical history on file.       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     EXAMINATION OF PERFORMANCE DEFICITS:    Cognitive/Behavioral Status:  Orientation  Overall Orientation Status: Within Normal Limits  Orientation Level: Oriented X4  Cognition  Overall Cognitive Status: Exceptions  Arousal/Alertness: Appropriate responses to stimuli  Following Commands: Follows one step commands consistently;Follows multistep commands with increased time  Attention Span: Appears intact  Memory: Decreased short term memory  Safety Judgement: Decreased awareness of need for safety;Decreased awareness of need for assistance  Problem Solving: Assistance required to identify errors made;Assistance required to generate solutions  Insights: Decreased awareness of deficits  Initiation: Does not require cues  Sequencing: Requires cues for some    Skin: Intact where visible    Edema: None present where visible    Hearing:   Hearing  Hearing: Within functional limits    Vision/Perceptual:          Vision  Vision: Impaired  Vision Exceptions: Wears glasses at all times  Perception  Overall Perceptual Status: WFL    Range of Motion:   AROM: Generally decreased, functional  PROM: Generally decreased, functional      Strength:  Strength: Generally decreased, functional      Coordination:  Coordination: Generally decreased, functional            Tone &  Sensation:   Tone: Normal  Sensation: Intact          Functional Mobility and Transfers for ADLs:    Bed Mobility:     Bed Mobility Training  Bed Mobility Training: Yes  Supine to Sit: Minimum assistance;Assist X2  Sit to Supine: Assist X2;Minimum assistance  Scooting: Minimum assistance    Transfers:      Pharmacologist: Yes  Sit to Stand: Contact-guard assistance  Stand to Sit: Contact-guard assistance  Bed to Chair: Contact-guard assistance                     Balance:   Standing: Impaired  Balance  Sitting: Intact  Standing: Impaired  Standing - Static: Constant support;Good  Standing - Dynamic: Constant support;Good      ADL Assessment:          Feeding: Independent  Grooming: Stand by assistance       UE Bathing: Minimal assistance       Skin Care: Bath wipes    LE Bathing: Maximum assistance  LE Bathing Skilled Clinical Factors: Typically uses AE @ home    UE Dressing: Minimal assistance       LE Dressing: Maximum assistance  LE Dressing Skilled Clinical Factors: Typically uses AE @ home    Toileting: Maximum assistance     ADL Intervention and task modifications:    Grooming: .  - Face Washing : Stand-by Assistance and Seated in Chair  - Brushing/Combing Hair : Stand-by Assistance and Seated in chair  - Hair Care : Stand-by Assistance and Seated in [place] AM-PACTM "6 Clicks"                                                       Daily Activity Inpatient Short Form  How much help from another person does the patient currently need... Total; A Lot A Little None   1.  Putting on and taking off regular lower body clothing? []   1 [x]   2 []   3 []   4   2.  Bathing (including washing, rinsing, drying)? []   1 [x]   2 []   3 []   4   3.  Toileting, which includes using toilet, bedpan or urinal? []  1 [x]   2 []   3 []   4   4.  Putting on and taking off regular upper body clothing? []   1 [x]   2 []   3 []   4   5.  Taking care of personal grooming such as brushing teeth? []   1 []    2 [x]   3 []   4   6.  Eating meals? []   1 []   2 [x]   3 []   4    2007, [employer] [place], under license to [place]. All rights reserved     Score: 14/24     Interpretation of Tool:  Represents clinically-significant functional categories (i.e. Activities of daily living).    Cutoff score 39.4 (19) correlates to a good likelihood of discharging home versus a facility  [place], Jon Gills, Jeanella Flattery, Mena Goes. [place], Roslynn Amble. Annabell Howells, AM-PAC "6-Clicks" Functional Assessment Scores Predict [facility] Discharge Destination, Physical Therapy, Volume 94, Issue 9, 29 August 2013, Pages [phone], [email or web address]    Pain Rating:  Reported some discomfort in BLEs, did not rate, RN aware    Pain Intervention(s):   nursing notified, rest, and repositioning    Activity Tolerance:   Good and requires rest breaks    After treatment:   Patient left in no apparent distress sitting up in chair, Call bell within reach, and Bed/ chair alarm activated    COMMUNICATION/EDUCATION:   The patient's plan of care was discussed with: physical therapist and registered nurse    Patient Education  Education Given To: Patient  Education Provided: Role of Therapy;Plan of Care;ADL Adaptive Strategies;Fall Prevention Medical laboratory scientific officer Method: Verbal  Barriers to Learning: Cognition  Education Outcome: Verbalized understanding;Continued education needed    Thank you for this referral.  Lynder Parents, OT  Minutes: 58    Occupational Therapy Evaluation Charge Determination   History Examination Decision-Making  MEDIUM Complexity : Expanded review of history including physical, cognitive and psychocial history  MEDIUM Complexity: 3-5 Performance deficits relating to physical, cognitive, or psychosocial skills that result in activity limitations and/or participation restrictions LOW Complexity: No comorbidities that affect functional and  no  verbal  or physical assist needed to complete eval tasks   Based on the above components, the patient evaluation is determined to be of the following complexity level: Low

## 2023-03-30 NOTE — Progress Notes (Signed)
2000: Bedside and Verbal shift change report given to Lucas (oncoming nurse) by Bing Neighbors (offgoing nurse). Report included the following information Nurse Handoff Report, Index, Adult Overview, Intake/Output, MAR, and Cardiac Rhythm NSR .     2300: BG 359, NP Sarah notified and aware.     0800:Bedside and Verbal shift change report given to [place] RN (oncoming nurse) by Minette Brine RN (offgoing nurse). Report included the following information Nurse Handoff Report, Index, Adult Overview, Intake/Output, MAR, and Cardiac Rhythm NSR.

## 2023-03-30 NOTE — Progress Notes (Signed)
Clinical Pharmacy Note: IV to PO Automatic Conversion  Please note: Jamie Bryant's medication(s) (famotidine) has/have been changed from IV to PO based on the following critiera:    Patient is tolerating oral medications  Patient is tolerating a diet more advanced than clear liquids  Patient is not requiring vasopressors    This IV to PO conversion is based on the P&T approved automatic conversion policy for eligible patients.  Please call with questions.

## 2023-03-30 NOTE — Progress Notes (Signed)
Hospitalist Progress Note  Bonner Puna, MD  Answering service: [phone] OR [phone] from in house phone        Date of Service:  03/30/2023  NAME:  Jamie Bryant  DOB:  February 20, 1941  MRN:  [id number]      Admission Summary:   Jamie Bryant is a 82 y.o. female with past medical history of type 2 diabetes mellitus, hypertension, hyperlipidemia, chronic venous sufficiency, chronic bilateral lower extremity edema, chronic venous stasis lower extremity ulcers, arthritis, asthma, CKD, depression, fibromyalgia, gastroparesis, neuropathy, stroke, psychotic disorder unspecified, obesity presented to the emergency department via EMS from home with chief complaint of shortness of breath.  Patient is a limited historian.  Majority of history is obtained per review of ED electronic medical records.  Per the ER triage note, patient was noted short of breath.  EMS was dispatched to the scene and gave patient Furosemide 80 mg IV x 1 dose, Dexamethasone, and Nitroglycerin (paste) on chest.  Initial O2 saturations were in the 60s.  Patient reportedly was placed on CPAP.  On arrival to the ED, initial recorded vital signs were temperature 99.7 F, BP 141/59, heart rate 99, respiratory rate 28, O2 saturation 97% on CPAP FiO2 50%.  12-lead EKG showed normal sinus rhythm at 93 bpm.  Chest reportable showed hazy opacification of both lungs right greater than left, likely pulmonary edema.  ABG at 1743 hrs.: pH 7.34, pCO2 52.5, pO2 114, HCO3 28.3, base excess 1.8, SaO2 98.1% on FiO2 50% IPAP 15/ EPAP 6.  Abnormal labs included WBC 25.6, neutrophils 88%, hemoglobin 9.2, blood glucose 262, proBNP 556, albumin 3.4.  ED then consulted hospitalist.  On arrival to bedside, patient is on BiPAP.  She is mostly mumbling with mostly incomprehensible speech and not answering questions appropriately.     Interval history / Subjective:     Seen and  examined for follow up respiratory failure. On 1L O2 but this was off as she was sleeping. No new complaints. SOB is better.      Assessment & Plan:     Acute respiratory failure with hypoxia and hypercapnia  IMCU monitor   -Bipap HS and prn   -Possibly due to OHS + pneumonia, component of mild pulmonary edema , may have copd vs asthma   -Lasix 20 IV BID  -had episode of stridor after CTA 3/29, improved after steroids ,epi neb   -Continue nebs tx   -Pulmonary is following   -Continue steroids      Acute pulmonary edema  Pleural effusions  -Reportedly given Lasix 40 mg IV x 1 dose prior to admission per EMS  -Place on strict I's and O's and daily weights  -Lasix 20 mg IV BID  - echo normal EF , no diastolic dysfunction  -Continue diuresis as long cr tolerate   -CXR shows improved pulmonary edema      Sepsis unspecified organism unspecified whether acute organ dysfunction  -Initial clinical indicators: WBC 25.6, RR 28  -No sepsis 30 ml/kg IVF bolus ordered due to concern for pulmonary edema  -Order empiric IV antibiotic coverage:  Cefepime 2000 mg IV every 8 hours  Vancomycin pharmacy dosing  -Check lactic acid level  -Possible PNA?  -Procal <0.05 --> 1.24  -Continue antibiotics for now  -respiratory viral panel is negative      AMS (altered mental status)  -Uncertain of patient's baseline mental status.  Past PCP chart record notes possible dementia.  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 I am concerned that she may not be safe for home.     May be ready for DC tomorrow.     Code status: full  Prophylaxis: lovenox  Care Plan discussed with: patient, nurse   Anticipated Disposition: PT/OT pending for further recommendations      Principal Problem:    Acute respiratory failure with hypoxia and hypercapnia ([place])  Resolved Problems:    * No resolved hospital problems. *            Review of Systems:   Pertinent items are noted in HPI.         Vital Signs:    Last 24hrs VS reviewed since prior progress note. Most recent are:  BP (!) 156/57   Pulse 58   Temp 98.1 F (36.7 C)   Resp 15   Ht 1.626 m (5' 4.02")   Wt 93.9 kg (207 lb)   SpO2 100%   BMI 35.51 kg/m        Intake/Output Summary (Last 24 hours) at 03/30/2023 0810  Last data filed at 03/30/2023 0701  Gross per 24 hour   Intake 320 ml   Output 2900 ml   Net -2580 ml          Physical Examination:     I had a face to face encounter with this patient and independently examined them on 03/30/2023 as outlined below:          General : alert x 3, awake, cooperative,   HEENT: PEERL, EOMI, moist mucus membrane  Neck: supple, no JVD, no meningeal signs  Chest: severely decreased airflow bilaterally   CVS: S1 S2 heard, Capillary refill less than 2 seconds  Abd: soft/ non tender, non distended, BS physiological,   Ext: no clubbing, no cyanosis, brisk 2+ DP pulses. Unna boots on both LE , + ankle edema   Neuro/Psych: pleasant mood and affect, CN 2-12 grossly intact, sensory grossly within normal limit  Skin: warm           Data Review:    Review  and/or order of clinical lab test  Review and/or order of tests in the radiology section of CPT  Review and/or order of tests in the medicine section of CPT    I have independently reviewed and interpreted patient's lab and all other diagnostic data    Notes reviewed from all clinical/nonclinical/nursing services involved in patient's clinical care. Care  coordination discussions were held with appropriate clinical/nonclinical/ nursing providers based on care coordination needs.     Labs:     Recent Labs     03/29/23  0238 03/30/23  0411   WBC 15.0* 16.8*   HGB 7.4* 8.3*   HCT 24.5* 27.1*   PLT 304 391       Recent Labs     03/28/23  0322 03/29/23  0238 03/30/23  0411   NA 140 141 139   K 3.8 4.2 4.0   CL 106 106 105   CO2 32 28 32   BUN 19 24* 21*   MG  --  2.2  --        Recent Labs     03/29/23  0238 03/30/23  0411   ALT 17 20   GLOB 3.4 3.5       No results for input(s): "INR", "APTT" in the last 72 hours.    Invalid input(s): "PTP"     No results for input(s): "TIBC", "FERR" in the last 72 hours.    Invalid input(s): "FE", "PSAT"     No results found for: "FOL", "RBCF"   No results for input(s): "PH", "PCO2", "PO2" in the last 72 hours.  No results for input(s): "CPK" in the last 72 hours.    Invalid input(s): "CPKMB", "CKNDX", "TROIQ"  No results found for: "CHOL", "[place]", "CHLST", "CHOLV", "HDL", "HDLC", "LDL", "LDLC", "TGLX", "TRIGL"  No results found for: "GLUCPOC"        Medications Reviewed:     Current Facility-Administered Medications   Medication Dose Route Frequency    insulin NPH (HumuLIN N;NovoLIN N) injection vial 10 Units  10 Units SubCUTAneous BID AC    insulin glargine (LANTUS) injection vial 14 Units  0.13 Units/kg SubCUTAneous Nightly    doxycycline hyclate (VIBRA-TABS) tablet 100 mg  100 mg Oral 2 times per day    furosemide (LASIX) injection 40 mg  40 mg IntraVENous Once    sodium chloride nebulizer 0.9 % solution 3 mL  3 mL Nebulization Q4H PRN    methylPREDNISolone sodium succ  (SOLU-MEDROL) 40 mg in sterile water 1 mL injection  40 mg IntraVENous Q12H    racepinephrine HCl (VAPONEFPRIN) 2.25 % nebulizer solution NEBU 0.5 mL  0.5 mL Nebulization Q4H PRN    famotidine (PEPCID) 20 mg in sodium chloride (PF) 0.9 % 10 mL injection  20 mg IntraVENous BID    diphenhydrAMINE (BENADRYL) injection 25 mg  25 mg IntraVENous Q6H PRN    ondansetron (ZOFRAN) injection 4 mg  4 mg IntraVENous Q6H PRN    QUEtiapine (SEROQUEL) tablet 12.5 mg  12.5 mg Oral Nightly    guaiFENesin (ROBITUSSIN) 100 MG/5ML liquid 200 mg  200 mg Oral Q4H PRN    furosemide (LASIX) injection 20 mg  20 mg IntraVENous BID    ipratropium 0.5 mg-albuterol 2.5 mg (DUONEB) nebulizer solution 1 Dose  1 Dose Inhalation Q4H PRN    carvedilol (COREG) tablet 12.5 mg  12.5 mg Oral BID    arformoterol 15 mcg-budesonide 0.25 mg neb solution   Nebulization BID RT    lisinopril (PRINIVIL;ZESTRIL) tablet 20 mg  20 mg Oral Nightly    pregabalin (LYRICA) capsule 300 mg  300 mg Oral BID    sodium chloride flush 0.9 % injection 5-40 mL  5-40 mL IntraVENous 2 times per day    sodium chloride flush 0.9 % injection 5-40 mL  5-40 mL IntraVENous PRN    0.9 % sodium  chloride infusion   IntraVENous PRN    enoxaparin Sodium (LOVENOX) injection 30 mg  30 mg SubCUTAneous BID    acetaminophen (TYLENOL) tablet 650 mg  650 mg Oral Q6H PRN    Or    acetaminophen (TYLENOL) suppository 650 mg  650 mg Rectal Q6H PRN    sodium chloride flush 0.9 % injection 5-40 mL  5-40 mL IntraVENous 2 times per day    sodium chloride flush 0.9 % injection 5-40 mL  5-40 mL IntraVENous PRN    0.9 % sodium chloride infusion   IntraVENous PRN    glucose chewable tablet 16 g  4 tablet Oral PRN    dextrose bolus 10% 125 mL  125 mL IntraVENous PRN    Or    dextrose bolus 10% 250 mL  250 mL IntraVENous PRN    glucagon injection 1 mg  1 mg SubCUTAneous PRN    dextrose 10 % infusion   IntraVENous Continuous PRN    insulin lispro (HUMALOG) injection vial 0-4 Units  0-4 Units SubCUTAneous Q4H      ______________________________________________________________________  EXPECTED LENGTH OF STAY: Unable to retrieve estimated LOS  ACTUAL LENGTH OF STAY:          [place]  Elias Massare, MD

## 2023-03-31 LAB — CBC WITH AUTO DIFFERENTIAL
Basophils %: 0 % (ref 0–1)
Basophils Absolute: 0 10*3/uL (ref 0.0–0.1)
Eosinophils %: 0 % (ref 0–7)
Eosinophils Absolute: 0 10*3/uL (ref 0.0–0.4)
Hematocrit: 30.3 % — ABNORMAL LOW (ref 35.0–47.0)
Hemoglobin: 9.2 g/dL — ABNORMAL LOW (ref 11.5–16.0)
Immature Granulocytes %: 0 %
Immature Granulocytes Absolute: 0 10*3/uL
Lymphocytes %: 11 % — ABNORMAL LOW (ref 12–49)
Lymphocytes Absolute: 2 10*3/uL (ref 0.8–3.5)
MCH: 24.4 PG — ABNORMAL LOW (ref 26.0–34.0)
MCHC: 30.4 g/dL (ref 30.0–36.5)
MCV: 80.4 FL (ref 80.0–99.0)
MPV: 11.6 FL (ref 8.9–12.9)
Monocytes %: 9 % (ref 5–13)
Monocytes Absolute: 1.7 10*3/uL — ABNORMAL HIGH (ref 0.0–1.0)
Neutrophils %: 80 % — ABNORMAL HIGH (ref 32–75)
Neutrophils Absolute: 14.9 10*3/uL — ABNORMAL HIGH (ref 1.8–8.0)
Nucleated RBCs: 0 PER 100 WBC
Platelets: 426 10*3/uL — ABNORMAL HIGH (ref 150–400)
RBC: 3.77 M/uL — ABNORMAL LOW (ref 3.80–5.20)
RDW: 17.2 % — ABNORMAL HIGH (ref 11.5–14.5)
WBC: 18.6 10*3/uL — ABNORMAL HIGH (ref 3.6–11.0)
nRBC: 0 10*3/uL (ref 0.00–0.01)

## 2023-03-31 LAB — POCT GLUCOSE
POC Glucose: 310 mg/dL — ABNORMAL HIGH (ref 65–117)
POC Glucose: 209 mg/dL — ABNORMAL HIGH (ref 65–117)
POC Glucose: 154 mg/dL — ABNORMAL HIGH (ref 65–117)
POC Glucose: 127 mg/dL — ABNORMAL HIGH (ref 65–117)
POC Glucose: 252 mg/dL — ABNORMAL HIGH (ref 65–117)
POC Glucose: 154 mg/dL — ABNORMAL HIGH (ref 65–117)

## 2023-03-31 LAB — COMPREHENSIVE METABOLIC PANEL W/ REFLEX TO MG FOR LOW K
ALT: 30 U/L (ref 12–78)
AST: 12 U/L — ABNORMAL LOW (ref 15–37)
Albumin/Globulin Ratio: 0.7 — ABNORMAL LOW (ref 1.1–2.2)
Albumin: 2.8 g/dL — ABNORMAL LOW (ref 3.5–5.0)
Alk Phosphatase: 84 U/L (ref 45–117)
Anion Gap: 2 mmol/L — ABNORMAL LOW (ref 5–15)
BUN/Creatinine Ratio: 27 — ABNORMAL HIGH (ref 12–20)
BUN: 22 MG/DL — ABNORMAL HIGH (ref 6–20)
CO2: 30 mmol/L (ref 21–32)
Calcium: 9.3 MG/DL (ref 8.5–10.1)
Chloride: 103 mmol/L (ref 97–108)
Creatinine: 0.82 MG/DL (ref 0.55–1.02)
Est, Glom Filt Rate: 73 mL/min/{1.73_m2} (ref >60)
Globulin: 4 g/dL (ref 2.0–4.0)
Glucose: 183 mg/dL — ABNORMAL HIGH (ref 65–100)
Potassium: 4 mmol/L (ref 3.5–5.1)
Sodium: 135 mmol/L — ABNORMAL LOW (ref 136–145)
Total Bilirubin: 0.2 MG/DL (ref 0.2–1.0)
Total Protein: 6.8 g/dL (ref 6.4–8.2)

## 2023-03-31 LAB — CULTURE, BLOOD 1: Culture: NO GROWTH

## 2023-03-31 LAB — CULTURE, BLOOD 2: Culture: NO GROWTH

## 2023-03-31 MED ORDER — INSULIN LISPRO 100 UNIT/ML IJ SOLN
100 | INTRAMUSCULAR | Status: DC
Start: 2023-03-31 — End: 2023-04-01
  Administered 2023-03-31: 16:00:00 2 [IU] via SUBCUTANEOUS
  Administered 2023-04-01: 01:00:00 4 [IU] via SUBCUTANEOUS
  Administered 2023-04-01: 16:00:00 1 [IU] via SUBCUTANEOUS

## 2023-03-31 MED ORDER — ENOXAPARIN SODIUM 40 MG/0.4ML IJ SOSY
40 | INTRAMUSCULAR | Status: DC
Start: 2023-03-31 — End: 2023-04-01
  Administered 2023-04-01: 01:00:00 40 mg via SUBCUTANEOUS

## 2023-03-31 MED ORDER — PREDNISONE 20 MG PO TABS
20 | ORAL | Status: DC
Start: 2023-03-31 — End: 2023-04-01
  Administered 2023-04-01: 13:00:00 40 mg via ORAL

## 2023-03-31 MED ORDER — HYDRALAZINE HCL 20 MG/ML IJ SOLN
20 | INTRAMUSCULAR | Status: DC | PRN
Start: 2023-03-31 — End: 2023-04-01
  Administered 2023-03-31 – 2023-04-01 (×2): 10 mg via INTRAVENOUS

## 2023-03-31 MED FILL — LYRICA 100 MG PO CAPS: 100 MG | ORAL | Qty: 3

## 2023-03-31 MED FILL — QUETIAPINE FUMARATE 25 MG PO TABS: 25 MG | ORAL | Qty: 1

## 2023-03-31 MED FILL — LOVENOX 30 MG/0.3ML IJ SOSY: 30 MG/0.3ML | INTRAMUSCULAR | Qty: 0.3

## 2023-03-31 MED FILL — CARVEDILOL 12.5 MG PO TABS: 12.5 MG | ORAL | Qty: 1

## 2023-03-31 MED FILL — HYDRALAZINE HCL 20 MG/ML IJ SOLN: 20 MG/ML | INTRAMUSCULAR | Qty: 1

## 2023-03-31 MED FILL — FAMOTIDINE 20 MG PO TABS: 20 MG | ORAL | Qty: 1

## 2023-03-31 MED FILL — DOXYCYCLINE HYCLATE 100 MG PO TABS: 100 MG | ORAL | Qty: 1

## 2023-03-31 MED FILL — HUMALOG 100 UNIT/ML IJ SOLN: 100 UNIT/ML | INTRAMUSCULAR | Qty: 2

## 2023-03-31 MED FILL — HUMULIN N 100 UNIT/ML SC SUSP: 100 UNIT/ML | SUBCUTANEOUS | Qty: 10

## 2023-03-31 MED FILL — HUMALOG 100 UNIT/ML IJ SOLN: 100 UNIT/ML | INTRAMUSCULAR | Qty: 1

## 2023-03-31 MED FILL — METHYLPREDNISOLONE SODIUM SUCC 40 MG IJ SOLR: 40 MG | INTRAMUSCULAR | Qty: 40

## 2023-03-31 MED FILL — ARFORMOTEROL TARTRATE 15 MCG/2ML IN NEBU: 15 MCG/2ML | RESPIRATORY_TRACT | Qty: 2

## 2023-03-31 MED FILL — DIPHENHYDRAMINE HCL 50 MG/ML IJ SOLN: 50 MG/ML | INTRAMUSCULAR | Qty: 1

## 2023-03-31 MED FILL — FUROSEMIDE 10 MG/ML IJ SOLN: 10 MG/ML | INTRAMUSCULAR | Qty: 2

## 2023-03-31 MED FILL — HUMALOG 100 UNIT/ML IJ SOLN: 100 UNIT/ML | INTRAMUSCULAR | Qty: 3

## 2023-03-31 MED FILL — LISINOPRIL 20 MG PO TABS: 20 MG | ORAL | Qty: 1

## 2023-03-31 MED FILL — LANTUS 100 UNIT/ML SC SOLN: 100 UNIT/ML | SUBCUTANEOUS | Qty: 14

## 2023-03-31 NOTE — Progress Notes (Signed)
Referral source:   Lital Zapp at [facility] in [facility] IMC[facility] 2. Chaplain attended rounds in the [place] as part of the Interdisciplinary team where the patient's ongoing care was discussed. I reviewed the medical record as part of this encounter.     Outcome: Interdisciplinary team are aware of chaplain availability and were encouraged to request support as needed.      Advised nurse to contact [place] for any further referrals.The chaplain on-call can be reached at [phone]     Netha Dafoe. Etheleen Mayhew, MDiv, [facility]  Staff Chaplain

## 2023-03-31 NOTE — Progress Notes (Addendum)
Lovenox Monitoring  Indication: DVT Prophylaxis  Recent Labs     03/29/23  0238 03/30/23  0411 03/31/23  0214   HGB 7.4* 8.3* 9.2*   PLT 304 391 426*     Current Weight: 93.9 kg  Est. CrCl = ~60-65 ml/min  Current Dose: 30 mg subcutaneously every 12 hours.  Plan: Change to 40 mg subcutaneously every 24 hours per [facility] P&T Committee Protocol with respect to weight and renal function.  Pharmacy will continue to monitor patient daily and will make dosage adjustments based upon changing renal function.

## 2023-03-31 NOTE — Progress Notes (Signed)
Hospitalist Progress Note  Haskell Flirt, MD  Answering service: [phone] OR [phone] from in house phone        Date of Service:  03/31/2023  NAME:  Jamie Bryant  DOB:  [birth year]  MRN:  [id number]      Admission Summary:   Jamie Bryant is a 82 y.o. female with past medical history of type 2 diabetes mellitus, hypertension, hyperlipidemia, chronic venous sufficiency, chronic bilateral lower extremity edema, chronic venous stasis lower extremity ulcers, arthritis, asthma, CKD, depression, fibromyalgia, gastroparesis, neuropathy, stroke, psychotic disorder unspecified, obesity presented to the emergency department via EMS from home with chief complaint of shortness of breath.  Patient is a limited historian.  Majority of history is obtained per review of ED electronic medical records.  Per the ER triage note, patient was noted short of breath.  EMS was dispatched to the scene and gave patient Furosemide 80 mg IV x 1 dose, Dexamethasone, and Nitroglycerin (paste) on chest.  Initial O2 saturations were in the 60s.  Patient reportedly was placed on CPAP.  On arrival to the ED, initial recorded vital signs were temperature 99.7 F, BP 141/59, heart rate 99, respiratory rate 28, O2 saturation 97% on CPAP FiO2 50%.  12-lead EKG showed normal sinus rhythm at 93 bpm.  Chest reportable showed hazy opacification of both lungs right greater than left, likely pulmonary edema.  ABG at 1743 hrs.: pH 7.34, pCO2 52.5, pO2 114, HCO3 28.3, base excess 1.8, SaO2 98.1% on FiO2 50% IPAP 15/ EPAP 6.  Abnormal labs included WBC 25.6, neutrophils 88%, hemoglobin 9.2, blood glucose 262, proBNP 556, albumin 3.4.  ED then consulted hospitalist.  On arrival to bedside, patient is on BiPAP.  She is mostly mumbling with mostly incomprehensible speech and not answering questions appropriately.     Interval history / Subjective:   Patient seen and  examined.  She is still dyspneic with minimal exertion.  Denies any chest pain.  Oxygen requirement is down, patient was taken off oxygen and is currently on room air.     Assessment & Plan:     Acute respiratory failure with hypoxia and hypercapnia  IMCU monitor   -Bipap HS and prn   -Possibly due to OHS + pneumonia, component of mild pulmonary edema , may have copd vs asthma   -Lasix 20 IV BID  -had episode of stridor after CTA 3/29, improved after steroids ,epi neb   -Continue nebs tx   -Appreciate pulmonary consult and recommendations;  -Continue steroids    -Continue diuretics;  -Will need repeat chest x-ray in 4 to 6 weeks; outpatient follow-up with pulmonology;    Acute pulmonary edema  Pleural effusions  -Reportedly given Lasix 40 mg IV x 1 dose prior to admission per EMS  -Place on strict I's and O's and daily weights  -Lasix 20 mg IV BID  - echo normal EF , no diastolic dysfunction  -Continue diuresis as long cr tolerate   -CXR shows improved pulmonary edema      Sepsis unspecified organism unspecified whether acute organ dysfunction  -Initial clinical indicators: WBC 25.6, RR 28  -No sepsis 30 ml/kg IVF bolus ordered due to concern for pulmonary edema  -Order empiric IV antibiotic coverage:  Cefepime 2000 mg IV every 8 hours  Vancomycin pharmacy dosing  -Check lactic acid level  -Possible PNA?  -Procal <0.05 --> 1.24  -Continue antibiotics for now  -respiratory viral panel is negative      AMS (altered mental status)  -  Uncertain of patient's baseline mental status.  Past PCP chart record notes possible dementia.  -Possibly due to hypercarbia   -Seems to be at baseline      Uncontrolled type 2 diabetes mellitus with hyperglycemia  -Order insulin correctional sliding scale coverage  -Order scheduled point-of-care blood glucose monitoring  -Order hemoglobin A1c level   -Lantus 14 units   -added NPH bid paired with iv solumedrol      Normocytic normochromic anemia  -Hgb = 9.2  -repeat H & H  -transfuse if Hgb  < 7.0  -Hb 7 this morning  -continue to monitor   Iron study, stool ob      Chronic venous stasis ulcers of both lower extremities  -Followed by wound care clinic with diagnosis of idiopathic chronic venous hypertension of right lower extremity with ulcer,venous stasis ulcers of left calf, and nonpressure ulcer of right calf  -Currently has Unna boots in place  -Wound care consulted     Generalized weakness  -Plan as above  -Consider for PT/OT evaluation     Essential hypertension  -Monitor BP  -Provide BP medications as needed      Pending PT/OT recommendations for the patient. I am concerned that she may not be safe for home.        Code status: full  Prophylaxis: lovenox  Care Plan discussed with: patient, nurse   Anticipated Disposition: PT/OT pending for further recommendations   -Anticipate discharge to home on 4/3;       Principal Problem:    Acute respiratory failure with hypoxia and hypercapnia ([place])  Resolved Problems:    * No resolved hospital problems. *            Review of Systems:   Pertinent items are noted in HPI.         Vital Signs:    Last 24hrs VS reviewed since prior progress note. Most recent are:  BP (!) 197/67   Pulse 73   Temp 98.3 F (36.8 C) (Oral)   Resp 20   Ht 1.626 m (5' 4.02")   Wt 93.9 kg (207 lb)   SpO2 94%   BMI 35.51 kg/m        Intake/Output Summary (Last 24 hours) at 03/31/2023 1901  Last data filed at 03/31/2023 1118  Gross per 24 hour   Intake --   Output 2800 ml   Net -2800 ml          Physical Examination:     I had a face to face encounter with this patient and independently examined them on 03/31/2023 as outlined below:          General : alert x 3, awake, cooperative,   HEENT: PEERL, EOMI, moist mucus membrane  Neck: supple, no JVD, no meningeal signs  Chest: severely decreased airflow bilaterally   CVS: S1 S2 heard, Capillary refill less than 2 seconds  Abd: soft/ non tender, non distended, BS physiological,   Ext: no clubbing, no cyanosis, brisk 2+ DP pulses. Unna  boots on both LE , + ankle edema   Neuro/Psych: pleasant mood and affect, CN 2-12 grossly intact, sensory grossly within normal limit  Skin: warm           Data Review:    Review and/or order of clinical lab test  Review and/or order of tests in the radiology section of CPT  Review and/or order of tests in the medicine section of CPT    I have independently reviewed  and interpreted patient's lab and all other diagnostic data    Notes reviewed from all clinical/nonclinical/nursing services involved in patient's clinical care. Care coordination discussions were held with appropriate clinical/nonclinical/ nursing providers based on care coordination needs.     Labs:     Recent Labs     03/30/23  0411 03/31/23  0214   WBC 16.8* 18.6*   HGB 8.3* 9.2*   HCT 27.1* 30.3*   PLT 391 426*       Recent Labs     03/29/23  0238 03/30/23  0411 03/31/23  0214   NA 141 139 135*   K 4.2 4.0 4.0   CL 106 105 103   CO2 28 32 30   BUN 24* 21* 22*   MG 2.2  --   --        Recent Labs     03/29/23  0238 03/30/23  0411 03/31/23  0214   ALT 17 20 30    GLOB 3.4 3.5 4.0       No results for input(s): "INR", "APTT" in the last 72 hours.    Invalid input(s): "PTP"     No results for input(s): "TIBC", "FERR" in the last 72 hours.    Invalid input(s): "FE", "PSAT"     No results found for: "FOL", "RBCF"   No results for input(s): "PH", "PCO2", "PO2" in the last 72 hours.  No results for input(s): "CPK" in the last 72 hours.    Invalid input(s): "CPKMB", "CKNDX", "TROIQ"  No results found for: "CHOL", "[place]", "CHLST", "CHOLV", "HDL", "HDLC", "LDL", "LDLC", "TGLX", "TRIGL"  No results found for: "GLUCPOC"        Medications Reviewed:     Current Facility-Administered Medications   Medication Dose Route Frequency    enoxaparin (LOVENOX) injection 40 mg  40 mg SubCUTAneous Q24H    insulin lispro (HUMALOG) injection vial 0-4 Units  0-4 Units SubCUTAneous 4x Daily AC & HS    hydrALAZINE (APRESOLINE) injection 10 mg  10 mg IntraVENous Q6H PRN     famotidine (PEPCID) tablet 20 mg  20 mg Oral BID    insulin NPH (HumuLIN N;NovoLIN N) injection vial 10 Units  10 Units SubCUTAneous BID AC    insulin glargine (LANTUS) injection vial 14 Units  0.13 Units/kg SubCUTAneous Nightly    doxycycline hyclate (VIBRA-TABS) tablet 100 mg  100 mg Oral 2 times per day    sodium chloride nebulizer 0.9 % solution 3 mL  3 mL Nebulization Q4H PRN    methylPREDNISolone sodium succ (SOLU-MEDROL) 40 mg in sterile water 1 mL injection  40 mg IntraVENous Q12H    racepinephrine HCl (VAPONEFPRIN) 2.25 % nebulizer solution NEBU 0.5 mL  0.5 mL Nebulization Q4H PRN    diphenhydrAMINE (BENADRYL) injection 25 mg  25 mg IntraVENous Q6H PRN    ondansetron (ZOFRAN) injection 4 mg  4 mg IntraVENous Q6H PRN    QUEtiapine (SEROQUEL) tablet 12.5 mg  12.5 mg Oral Nightly    guaiFENesin (ROBITUSSIN) 100 MG/5ML liquid 200 mg  200 mg Oral Q4H PRN    furosemide (LASIX) injection 20 mg  20 mg IntraVENous BID    ipratropium 0.5 mg-albuterol 2.5 mg (DUONEB) nebulizer solution 1 Dose  1 Dose Inhalation Q4H PRN    carvedilol (COREG) tablet 12.5 mg  12.5 mg Oral BID    arformoterol 15 mcg-budesonide 0.25 mg neb solution   Nebulization BID RT    lisinopril (PRINIVIL;ZESTRIL) tablet 20 mg  20 mg Oral Nightly  pregabalin (LYRICA) capsule 300 mg  300 mg Oral BID    sodium chloride flush 0.9 % injection 5-40 mL  5-40 mL IntraVENous 2 times per day    sodium chloride flush 0.9 % injection 5-40 mL  5-40 mL IntraVENous PRN    0.9 % sodium chloride infusion   IntraVENous PRN    acetaminophen (TYLENOL) tablet 650 mg  650 mg Oral Q6H PRN    Or    acetaminophen (TYLENOL) suppository 650 mg  650 mg Rectal Q6H PRN    sodium chloride flush 0.9 % injection 5-40 mL  5-40 mL IntraVENous 2 times per day    sodium chloride flush 0.9 % injection 5-40 mL  5-40 mL IntraVENous PRN    0.9 % sodium chloride infusion   IntraVENous PRN    glucose chewable tablet 16 g  4 tablet Oral PRN    dextrose bolus 10% 125 mL  125 mL IntraVENous  PRN    Or    dextrose bolus 10% 250 mL  250 mL IntraVENous PRN    glucagon injection 1 mg  1 mg SubCUTAneous PRN    dextrose 10 % infusion   IntraVENous Continuous PRN     ______________________________________________________________________  EXPECTED LENGTH OF STAY: Unable to retrieve estimated LOS  ACTUAL LENGTH OF STAY:          6                 Haskell Flirt, MD

## 2023-03-31 NOTE — Care Coordination-Inpatient (Addendum)
Transition of Care Plan:    RUR: 16%  Prior Level of Functioning: independent  Disposition: SNF  [facility] - accepted  [employer] - denied  If SNF or IPR: Date FOC offered: 03/30/23  Date FOC received: 03/30/23  Accepting facility: n/a  Date authorization started with reference number: n/a  Date authorization received and expires: n/a  Follow up appointments: PCP  DME needed: none  Transportation at discharge: WC [facility]ena[facility] Niece vs Stretcher  IM/IMM Medicare/Tricare letter given:   Caregiver Contact: n/a  Discharge Caregiver contacted prior to discharge? N/a  Care Conference needed? N/a  Barriers to discharge: medical

## 2023-04-01 LAB — BASIC METABOLIC PANEL
Anion Gap: 3 mmol/L — ABNORMAL LOW (ref 5–15)
BUN/Creatinine Ratio: 31 — ABNORMAL HIGH (ref 12–20)
BUN: 22 MG/DL — ABNORMAL HIGH (ref 6–20)
CO2: 32 mmol/L (ref 21–32)
Calcium: 9.2 MG/DL (ref 8.5–10.1)
Chloride: 104 mmol/L (ref 97–108)
Creatinine: 0.72 MG/DL (ref 0.55–1.02)
Est, Glom Filt Rate: 85 mL/min/{1.73_m2} (ref >60)
Glucose: 184 mg/dL — ABNORMAL HIGH (ref 65–100)
Potassium: 4.3 mmol/L (ref 3.5–5.1)
Sodium: 139 mmol/L (ref 136–145)

## 2023-04-01 LAB — CBC
Hematocrit: 29.4 % — ABNORMAL LOW (ref 35.0–47.0)
Hemoglobin: 9.1 g/dL — ABNORMAL LOW (ref 11.5–16.0)
MCH: 24.6 PG — ABNORMAL LOW (ref 26.0–34.0)
MCHC: 31 g/dL (ref 30.0–36.5)
MCV: 79.5 FL — ABNORMAL LOW (ref 80.0–99.0)
MPV: 11.4 FL (ref 8.9–12.9)
Nucleated RBCs: 0 PER 100 WBC
Platelets: 470 10*3/uL — ABNORMAL HIGH (ref 150–400)
RBC: 3.7 M/uL — ABNORMAL LOW (ref 3.80–5.20)
RDW: 17.2 % — ABNORMAL HIGH (ref 11.5–14.5)
WBC: 15.2 10*3/uL — ABNORMAL HIGH (ref 3.6–11.0)
nRBC: 0 10*3/uL (ref 0.00–0.01)

## 2023-04-01 LAB — POCT GLUCOSE
POC Glucose: 351 mg/dL — ABNORMAL HIGH (ref 65–117)
POC Glucose: 156 mg/dL — ABNORMAL HIGH (ref 65–117)
POC Glucose: 130 mg/dL — ABNORMAL HIGH (ref 65–117)
POC Glucose: 103 mg/dL (ref 65–117)
POC Glucose: 234 mg/dL — ABNORMAL HIGH (ref 65–117)

## 2023-04-01 LAB — PHOSPHORUS: Phosphorus: 3.9 MG/DL (ref 2.6–4.7)

## 2023-04-01 LAB — MAGNESIUM: Magnesium: 2.2 mg/dL (ref 1.6–2.4)

## 2023-04-01 MED ORDER — LINZESS 290 MCG PO CAPS
290 | ORAL | Status: AC
Start: 2023-04-01 — End: ?

## 2023-04-01 MED ORDER — CARVEDILOL 12.5 MG PO TABS
12.5 MG | ORAL_TABLET | Freq: Two times a day (BID) | ORAL | 0 refills | Status: DC
Start: 2023-04-01 — End: 2023-06-09

## 2023-04-01 MED ORDER — AMLODIPINE BESYLATE 5 MG PO TABS
5 | ORAL | 0 refills | Status: AC
Start: 2023-04-01 — End: ?

## 2023-04-01 MED ORDER — TORSEMIDE 20 MG PO TABS
20 MG | Freq: Every day | ORAL | Status: AC
Start: 2023-04-01 — End: ?

## 2023-04-01 MED ORDER — INSULIN LISPRO 100 UNIT/ML IJ SOLN
100 | INTRAMUSCULAR | Status: AC
Start: 2023-04-01 — End: ?

## 2023-04-01 MED ORDER — BECLOMETHASONE DIPROP HFA 40 MCG/ACT IN AERB
40 | RESPIRATORY_TRACT | Status: AC
Start: 2023-04-01 — End: ?

## 2023-04-01 MED ORDER — INSULIN GLARGINE 100 UNIT/ML SC SOLN
100 | SUBCUTANEOUS | 3 refills | Status: AC
Start: 2023-04-01 — End: ?

## 2023-04-01 MED ORDER — IPRATROPIUM-ALBUTEROL 20-100 MCG/ACT IN AERS
20-100 | RESPIRATORY_TRACT | Status: AC
Start: 2023-04-01 — End: ?

## 2023-04-01 MED ORDER — OMEPRAZOLE 40 MG PO CPDR
40 | Freq: Every day | ORAL | 0 refills | Status: DC
Start: 2023-04-01 — End: 2023-07-10

## 2023-04-01 MED ORDER — ASPIRIN 81 MG PO TBEC
81 MG | Freq: Every day | ORAL | 0 refills | Status: DC
Start: 2023-04-01 — End: 2023-06-09

## 2023-04-01 MED ORDER — LIDOCAINE 4 % EX PTCH
4 | CUTANEOUS | Status: DC
Start: 2023-04-01 — End: 2023-04-01

## 2023-04-01 MED ORDER — INSULIN NPH (HUMAN) (ISOPHANE) 100 UNIT/ML SC SUSP
100 | SUBCUTANEOUS | Status: DC
Start: 2023-04-01 — End: 2023-04-01

## 2023-04-01 MED ORDER — PAROXETINE HCL 10 MG PO TABS
10 | ORAL | 0 refills | Status: AC
Start: 2023-04-01 — End: ?

## 2023-04-01 MED ORDER — PREDNISONE 20 MG PO TABS
20 | ORAL | 0 refills | Status: AC
Start: 2023-04-01 — End: 2023-04-05

## 2023-04-01 MED ORDER — SITAGLIPTIN PHOSPHATE 100 MG PO TABS
100 MG | Freq: Every day | ORAL | 0 refills | Status: DC
Start: 2023-04-01 — End: 2023-06-09

## 2023-04-01 MED ORDER — PREGABALIN 300 MG PO CAPS
300 | ORAL_CAPSULE | Freq: Two times a day (BID) | ORAL | 0 refills | Status: DC
Start: 2023-04-01 — End: 2023-08-17

## 2023-04-01 MED ORDER — QUINAPRIL HCL 40 MG PO TABS
40 | ORAL | 0 refills | Status: AC
Start: 2023-04-01 — End: 2024-03-31

## 2023-04-01 MED FILL — FAMOTIDINE 20 MG PO TABS: 20 MG | ORAL | Qty: 1

## 2023-04-01 MED FILL — LYRICA 100 MG PO CAPS: 100 MG | ORAL | Qty: 3

## 2023-04-01 MED FILL — DOXYCYCLINE HYCLATE 100 MG PO TABS: 100 MG | ORAL | Qty: 1

## 2023-04-01 MED FILL — HUMALOG 100 UNIT/ML IJ SOLN: 100 UNIT/ML | INTRAMUSCULAR | Qty: 4

## 2023-04-01 MED FILL — ARFORMOTEROL TARTRATE 15 MCG/2ML IN NEBU: 15 MCG/2ML | RESPIRATORY_TRACT | Qty: 2

## 2023-04-01 MED FILL — LANTUS 100 UNIT/ML SC SOLN: 100 UNIT/ML | SUBCUTANEOUS | Qty: 14

## 2023-04-01 MED FILL — HYDRALAZINE HCL 20 MG/ML IJ SOLN: 20 MG/ML | INTRAMUSCULAR | Qty: 1

## 2023-04-01 MED FILL — CARVEDILOL 12.5 MG PO TABS: 12.5 MG | ORAL | Qty: 1

## 2023-04-01 MED FILL — FUROSEMIDE 10 MG/ML IJ SOLN: 10 MG/ML | INTRAMUSCULAR | Qty: 2

## 2023-04-01 MED FILL — LIDOCAINE PAIN RELIEF 4 % EX PTCH: 4 % | CUTANEOUS | Qty: 1

## 2023-04-01 MED FILL — HUMULIN N 100 UNIT/ML SC SUSP: 100 UNIT/ML | SUBCUTANEOUS | Qty: 10

## 2023-04-01 MED FILL — HUMALOG 100 UNIT/ML IJ SOLN: 100 UNIT/ML | INTRAMUSCULAR | Qty: 1

## 2023-04-01 MED FILL — ENOXAPARIN SODIUM 40 MG/0.4ML IJ SOSY: 40 MG/0.4ML | INTRAMUSCULAR | Qty: 0.4

## 2023-04-01 MED FILL — PREDNISONE 20 MG PO TABS: 20 MG | ORAL | Qty: 2

## 2023-04-01 MED FILL — LISINOPRIL 20 MG PO TABS: 20 MG | ORAL | Qty: 1

## 2023-04-01 NOTE — Discharge Summary (Addendum)
Discharge Summary       PATIENT ID: Jamie Bryant  MRN: [id number]   DATE OF BIRTH: 12/20/1941    DATE OF ADMISSION: 03/25/2023  4:54 PM    DATE OF DISCHARGE: 04/01/23   PRIMARY CARE PROVIDER: No primary care provider on file.     ATTENDING PHYSICIAN: Katrine CohoBruno A  Elias Massare, MD  DISCHARGING PROVIDER: Katrine CohoBruno A  Elias Massare, MD    To contact this individual call [phone] and ask the operator to page.  If unavailable ask to be transferred the Adult Hospitalist Department.    CONSULTATIONS: IP CONSULT TO HOSPITALIST  PHARMACY TO DOSE VANCOMYCIN  IP CONSULT TO PULMONOLOGY  IP WOUND CARE NURSE CONSULT TO EVAL  IP CONSULT TO PSYCHIATRY    PROCEDURES/SURGERIES: * No surgery found *    ADMITTING DIAGNOSES & HOSPITAL COURSE:   Jamie AmisGloria Mcneel is a 82 y.o. female with past medical history of type 2 diabetes mellitus, hypertension, hyperlipidemia, chronic venous sufficiency, chronic bilateral lower extremity edema, chronic venous stasis lower extremity ulcers, arthritis, asthma, CKD, depression, fibromyalgia, gastroparesis, neuropathy, stroke, psychotic disorder unspecified, obesity presented to the emergency department via EMS from home with chief complaint of shortness of breath.  Patient is a limited historian.  Majority of history is obtained per review of ED electronic medical records.  Per the ER triage note, patient was noted short of breath.  EMS was dispatched to the scene and gave patient Furosemide 80 mg IV x 1 dose, Dexamethasone, and Nitroglycerin (paste) on chest.  Initial O2 saturations were in the 60s.  Patient reportedly was placed on CPAP.  On arrival to the ED, initial recorded vital signs were temperature 99.7 F, BP 141/59, heart rate 99, respiratory rate 28, O2 saturation 97% on CPAP FiO2 50%.  12-lead EKG showed normal sinus rhythm at 93 bpm.  Chest reportable showed hazy opacification of both lungs right greater than left, likely pulmonary edema.  ABG at 1743 hrs.: pH 7.34, pCO2 52.5, pO2 114, HCO3  28.3, base excess 1.8, SaO2 98.1% on FiO2 50% IPAP 15/ EPAP 6.  Abnormal labs included WBC 25.6, neutrophils 88%, hemoglobin 9.2, blood glucose 262, proBNP 556, albumin 3.4.  ED then consulted hospitalist.  On arrival to bedside, patient is on BiPAP.  She is mostly mumbling with mostly incomprehensible speech and not answering questions appropriately.     1.  Acute respiratory failure with hypoxia and hypercapnia  -Clinical status is guarded to critical  -Admit to IMCU  -If patient status worsens and not improved, consider for transfer to ICU.   -Currently on BiPAP  -Last ABG shows uncompensated acute primary respiratory acidosis  -Repeat ABG until corrected /compensated  -Continuous pulse oximetry monitoring  -Consult pulmonologist     2.  Acute pulmonary edema  -Reportedly given Lasix 40 mg IV x 1 dose prior to admission per EMS  -Order to echocardiogram is still concern for underlying CHF.  proBNP level is not significantly elevated to diagnosis same.  -Place on strict I's and O's and daily weights     3.  Sepsis unspecified organism unspecified whether acute organ dysfunction  -Initial clinical indicators: WBC 25.6, RR 28  -No sepsis 30 ml/kg IVF bolus ordered due to concern for pulmonary edema  -Order empiric IV antibiotic coverage:  Cefepime 2000 mg IV every 8 hours  Vancomycin pharmacy dosing  -Check lactic acid level     4.  AMS (altered mental status)  -Uncertain of patient's baseline mental status.  Past PCP chart  record notes possible dementia.  -Current exam significant for hypersomnolent/obtunded state  -As currently on BiPAP due to respiratory failure, defer on transport for CT head for now   -Place on neuro checks and fall precautions  -Consider for neurologist consultation     5.  Uncontrolled type 2 diabetes mellitus with hyperglycemia  -Order insulin correctional sliding scale coverage  -Order scheduled point-of-care blood glucose monitoring  -Order hemoglobin A1c level   -Order basal insulin  coverage: Lantus     6.  Normocytic normochromic anemia  -Hgb = 9.2  -repeat H & H  -transfuse if Hgb < 7.0  -check iron profile, B12, and folic acid   -order fecal occult blood test     7. Chronic venous stasis ulcers of both lower extremities  -Followed by wound care clinic with diagnosis of idiopathic chronic venous hypertension of right lower extremity with ulcer,venous stasis ulcers of left calf, and nonpressure ulcer of right calf  -Currently has Unna boots in place.  Due to nature of dressings will consult with wound care team for wound checks and further evaluation and application of Unna boot compression dressings     8.  Generalized weakness  -Plan as above  -Consider for PT/OT evaluation     9.  Essential hypertension  -Monitor BP  -Provide BP medications as needed    DISCHARGE DIAGNOSES / PLAN:      Acute respiratory failure with hypoxia and hypercapnia  IMCU monitor   -CPAP QHS  -Possibly due to OHS  -Continue diuretics;  -Will need repeat chest x-ray in 4 to 6 weeks; outpatient follow-up with pulmonology;  -3 more days of steroids      Acute pulmonary edema  Pleural effusions  - echo normal EF , no diastolic dysfunction  -Continue diuresis as long cr tolerate   -Torsemiede 20 mg PO daily as she was taking this at home. LONG TERM  -Home QVAR and added brovana      Sepsis unspecified organism unspecified whether acute organ dysfunction  -Sepsis ruled out      AMS (altered mental status)  -Uncertain of patient's baseline mental status.  Past PCP chart record notes possible dementia.  -Possibly due to hypercarbia   -Seems to be at baseline      Uncontrolled type 2 diabetes mellitus with hyperglycemia  -Order insulin correctional sliding scale coverage  -Lantus 14 units   -Home Januvia      Normocytic normochromic anemia  -Hgb = 9.2  -repeat H & H  -transfuse if Hgb < 7.0  -Hb 7 this morning  -continue to monitor   Iron study, stool ob      Chronic venous stasis ulcers of both lower extremities  -Followed  by wound care clinic with diagnosis of idiopathic chronic venous hypertension of right lower extremity with ulcer,venous stasis ulcers of left calf, and nonpressure ulcer of right calf  -Currently has Unna boots in place  -Wound care consulted  Bilateral lower legs- Every other day cleanse with VASHE, apply a piece of Optifoam Gentle NB AG, cover with 4 x 4's and secure with roll gauze and tape.      Generalized weakness  -Plan as above  -Consider for PT/OT evaluation  -Recommend short term SNF      Essential hypertension  -Monitor BP  -Provide BP medications as needed  -Home meds amlodipine and quinapril     OSA on CPAP  -Resume home CPAP    Neuropathy  -Home lyrica  Probable allergic reaction to contrast    Recommend short term SNF.      Med rec was made complicated by the patient having 2 charts in the system.       PENDING TEST RESULTS:   At the time of discharge the following test results are still pending: none    FOLLOW UP APPOINTMENTS:    @DCFOLLOWUP @     ADDITIONAL CARE RECOMMENDATIONS: none    DIET: diabetic diet    ACTIVITY: activity as tolerated    WOUND CARE:   Bilateral lower legs- Every other day cleanse with VASHE, apply a piece of Optifoam Gentle NB AG, cover with 4 x 4's and secure with roll gauze and tape.     EQUIPMENT needed: none      DISCHARGE MEDICATIONS:     Medication List        START taking these medications      albuterol-ipratropium 20-100 MCG/ACT Aers inhaler  Commonly known as: COMBIVENT RESPIMAT  Inhale 1 puff into the lungs every 6 hours     amLODIPine 5 MG tablet  Commonly known as: NORVASC  Take 1 tablet by mouth daily     aspirin 81 MG EC tablet  Take 1 tablet by mouth daily     beclomethasone 40 MCG/ACT Aerb inhaler  Commonly known as: Qvar RediHaler  Inhale 1 puff into the lungs in the morning and 1 puff in the evening.     carvedilol 12.5 MG tablet  Commonly known as: COREG  Take 1 tablet by mouth 2 times daily     insulin glargine 100 UNIT/ML injection vial  Commonly known  as: LANTUS  Inject 14 Units into the skin nightly     insulin lispro 100 UNIT/ML Soln injection vial  Commonly known as: HUMALOG  Inject 0-4 Units into the skin 4 times daily (before meals and nightly)     Linzess 290 MCG Caps capsule  Generic drug: linaclotide  Take 1 capsule by mouth every morning (before breakfast)     omeprazole 40 MG delayed release capsule  Commonly known as: PRILOSEC  Take 1 capsule by mouth every morning (before breakfast)     PARoxetine 10 MG tablet  Commonly known as: Paxil  Take 1 tablet by mouth daily     predniSONE 20 MG tablet  Commonly known as: DELTASONE  Take 2 tablets by mouth daily for 3 days  Start taking on: April 02, 2023     pregabalin 300 MG capsule  Commonly known as: LYRICA  Take 1 capsule by mouth 2 times daily for 30 days. Max Daily Amount: 600 mg     quinapril 40 MG tablet  Commonly known as: Accupril  Take 1 tablet by mouth daily     SITagliptin 100 MG tablet  Commonly known as: Januvia  Take 1 tablet by mouth daily     torsemide 20 MG tablet  Commonly known as: DEMADEX  Take 5 tablets by mouth daily  Start taking on: April 02, 2023               Where to Get Your Medications        Information about where to get these medications is not yet available    Ask your nurse or doctor about these medications  albuterol-ipratropium 20-100 MCG/ACT Aers inhaler  amLODIPine 5 MG tablet  aspirin 81 MG EC tablet  beclomethasone 40 MCG/ACT Aerb inhaler  carvedilol 12.5 MG tablet  insulin glargine 100 UNIT/ML injection vial  insulin lispro 100 UNIT/ML Soln injection vial  Linzess 290 MCG Caps capsule  omeprazole 40 MG delayed release capsule  PARoxetine 10 MG tablet  predniSONE 20 MG tablet  pregabalin 300 MG capsule  quinapril 40 MG tablet  SITagliptin 100 MG tablet  torsemide 20 MG tablet           NOTIFY YOUR PHYSICIAN FOR ANY OF THE FOLLOWING:   Fever over 101 degrees for 24 hours.   Chest pain, shortness of breath, fever, chills, nausea, vomiting, diarrhea, change in mentation,  falling, weakness, bleeding. Severe pain or pain not relieved by medications.  Or, any other signs or symptoms that you may have questions about.    DISPOSITION:    Home With:   OT  PT  HH  RN      X Long term SNF/Inpatient Rehab    Independent/assisted living    Hospice    Other:       PATIENT CONDITION AT DISCHARGE:     Functional status    Poor    X Deconditioned     Independent      Cognition    X Lucid     Forgetful     Dementia      Catheters/lines (plus indication)    Foley     PICC     PEG    X None      Code status    X Full code     DNR      PHYSICAL EXAMINATION AT DISCHARGE:    General : alert x 3, awake, no acute distress,   HEENT: PEERL, EOMI, moist mucus membrane  Neck: supple, no JVD, no meningeal signs  Chest: Clear to auscultation bilaterally   CVS: S1 S2 heard, Capillary refill less than 2 seconds  Abd: soft/ Non tender, non distended, BS physiological,   Ext: no clubbing, no cyanosis, no edema, brisk 2+ DP pulses  Neuro/Psych: pleasant mood and affect, CN 2-12 grossly intact, sensory grossly within normal limit, Strength 5/5 in all extremities  Skin: warm     CHRONIC MEDICAL DIAGNOSES:      Greater than 31 minutes were spent with the patient on counseling and coordination of care    Signed:   Katrine Coho, MD  04/01/2023  12:01 PM

## 2023-04-01 NOTE — Care Coordination-Inpatient (Signed)
Transition of Care Plan to SNF/Rehab    Communication to Patient/Family:  Met with patient and family and they are agreeable to the transition plan. The Plan for Transition of Care is related to the following treatment goals: SNF    The Patient and/or patient representative was provided with a choice of provider and agrees  with the discharge plan.      Yes [x]  No []     A Freedom of choice list was provided with basic dialogue that supports the patient's individualized plan of care/goals and shares the quality data associated with the providers.       Yes [x]  No []     SNF/Rehab Transition:  Patient has been accepted to [facility] SN[facility]Rehab and meets criteria for admission.   Patient will transported by [facility] and expected to leave at 1500.    Communication to SNF/Rehab:  Bedside RN, Earney Mallet, has been notified to update the transition plan to the facility and call report (phone number).  Discharge information has been updated on the AVS. And communicated to facility via [employer]s, or CC link.           Nursing Please include all hard scripts for controlled substances, med rec and dc summary, and AVS in packet.     Reviewed and confirmed with facility, [facility], can manage the patient care needs for the following:     Loraine Leriche with (X) only those applicable:  Medication:  [x] Medications are available at the facility  [] IV Antibiotics    [] Controlled Substance - hard copies available sent.  [] Weekly Labs    Equipment:  [] CPAP/BiPAP  [] Wound Vacuum  [] Foley or Urinary Device  [] PICC/Central Line  [] Nebulizer  [] Ventilator    Treatment:  [] Isolation (for MRSA, VRE, etc.)  [] Surgical Drain Management  [] Tracheostomy Care  [] Dressing Changes  [] Dialysis with transportation  [] PEG Care  [x] Oxygen  [] Daily Weights for Heart Failure    Dietary:  [] Any diet limitations  [] Tube Feedings   [] Total Parenteral Management (TPN)    Financial Resources:  [] Medicaid Application Completed    [] UAI  Completed and copy given to pt/family  and copy given to pt/family  [] A screening has previously been completed.    [] Level II Completed    [x]  Private pay individual who will not become   financially eligible for Medicaid within 6 months from admission to a [place] Nursing facility.     []  Individual refused to have screening conducted.     [] Medicaid Application Completed    [] The screening denied because it was determined individual did not need/did not qualify for nursing facility level of care.  []  Out of state residents seeking direct admission to a VA nursing facility.  []  Individuals who are inpatients of an out of state hospital, or in state or out of state veterans/Military hospital and seek direct admission to a VA nursing facility  []  Individuals who are pateints or residents of a state owned/operated facility that is licensed by Department of [employer] ([employer]) and seek direct admission to [facility] nursing facility  []  A screening not required for enrollment in [facility] services as set out in [facility] [phone]  []  [facility]) staff shall perform screenings of the [facility] clients.    Advanced Care Plan:  [] Surrogate Decision Maker of Care  [] POA  [] Communicated Code Status and copy sent.    Other:

## 2023-04-01 NOTE — Care Coordination-Inpatient (Addendum)
Transition of Care Plan:    RUR: 16%  Prior Level of Functioning: independent  Disposition: SNF  [facility] - accepted  Spoke with admissions  RM 402  Report # [id number]    If SNF or IPR: Date FOC offered: 03/30/23  Date FOC received: 03/30/23  Accepting facility: [facility]  Date authorization started with reference number: n/a  Date authorization received and expires: n/a  Follow up appointments: PCP  DME needed: none  Transportation at discharge: BLS, H2H, 1500  IM/IMM Medicare/Tricare letter given:   Caregiver Contact: n/a  Discharge Caregiver contacted prior to discharge? yes, son contacted  Care Conference needed? N/a

## 2023-04-01 NOTE — Other (Cosign Needed)
[employer] Liaison Team Note     LOS:  7 days     Patient goal(s) for today: take medications as prescribed, make needs known in an appropriate manner  [employer] Liaison team focus/goals: assess needs, provide support and education, coordinate care     Progress note: Writer met face to face with pt on the medical floor at [facility]. Pt was received laying in bed. Pt was open, engaged, calm, and amicable during this discussion. Her thinking was clear, linear, and discharge-focused. Writer introduced self, role, and purpose of visit. Pt discussed her challenges and confusion with her insurance, navigating resources, and getting support. She denied SI/HI/AVH. She reported that she's been depressed for 5-6 years and that her situation isn't improved because of multiple hospitalizations. Writer validated pt's experiences. Writer provided education on safe ways to relieve stress (using a stress ball or pillow to release stress) and validated challenges when the source of the stress doesn't go away. Writer helped pt process the challenges of navigating the healthcare system without support.     Barriers to Discharge: None  Guns in the home: No     Outpatient provider(s):  None reported  Insurance info/prescription coverage:  MEDICARE PART A AND B     Diagnosis: Mood disorder NOS    Plan:  Pt is not indicated for inpatient psychiatric admission. Pt was accepted to [facility] as of this morning. Please defer to psychiatry and medical team for details on discharge plan and disposition.  Follow up Psych Consult placed? No .   Psychiatrist updated? No        Participating treatment team members: Glenita Lammie, [facility] Jason Nest, MSW Student Intern

## 2023-04-02 ENCOUNTER — Ambulatory Visit: Payer: MEDICARE | Attending: Emergency Medicine | Primary: Internal Medicine

## 2023-04-02 NOTE — Care Coordination-Inpatient (Signed)
SECURE email notification sent to identified IDT members at [facility].

## 2023-04-13 NOTE — Care Coordination-Inpatient (Signed)
Care Transitions Post-Acute Facility Update Call on    04/08/23    Patient: Jamie Bryant Patient DOB: 1941/09/18   MRN: [id number]  Reason for Admission: Acute Respiratory failure with hypoxia and hypercapnia, Acute pulmonary edema, pleural effusions, AMS  Discharge Date: 04/01/23 RARS: Readmission Risk Score: 17.1         Care Transitions Post Acute Facility Update    Care Transitions Interventions      Post Acute Facility Update   Post Acute Facility: [facility]    ELOS: 21 days      Nursing   Prescribed diet: diabetic    Nutrition intake assessment: 50-100%   Wounds: Pos   Wound Location: bilatteral lower extremities   Wound Care Therapies: Every other day cleanse with VASHE, apply a piece of Optifoam Gentle NB AG, cover with 4 x 4's and secure with roll gauze and tape.   Labs: Abnormal   Abnormal Lab Notes: 03/01/23 WBC 15.2, RBC 3.70, Hgb 9.1, HCT 29.4, Platelets 470, anion gap 3, glucose 184, BUN 22, BUN/Cre ratio 31   Skilled Medication therapies: none   Medical equipment being used: wheelchair, walker   Disease specific clinical considerations: DM, OSA   Reported Nursing Updates: On oxygen 2 LNC,  son to bring in pt's CPAP machine,, wound to lower legs. Getting better. 04/07/23 wt 204.2 lbs vitals  04/07/23 1535 T 97.7, BP 137/50, P 72, RR 16, O2 sat 97% on 2LNC. Followed by speech therapy for cognition       Rehab/Functional   Prior Level of Functioning: Patient was modified independent using a rollator for functional mobility.  She reports that she did not use the rollator all of the time in her apartment, only outside the apartment   Cognitive function assessment: impaired   ADLs:  (Comment: Oral care set up, UB dressing Partial/Mod assist, LB dressing dependent, toileting hygiene dependent, bathing max assist)   Bed Mobility:  (Comment: partial to moderate assist)   Transfer Assistance:  (Comment: stand pivot transfers with Min A, chair/bed-chair transfer Partila/Moderated assist)   Ambulation  Assistance: Moderate Assistance   How far (in feet) is the patient ambulating?: 5   Rehab Notes: Parallel bars       SW/Discharge Planning   Goals of Care: Maximum progress in therapy for safe discharge to home   Palliative/Hospice Referral indicated: Neg   Caregiver support: family, home health servcies   Additional caregiver needs: to be determined   Discharge Planning / Barriers: Pt lives at [facility] [place]. Plan to return to home with home health services, date to be determined.   Care Progression on Track?: Pos  Next IDT Planned Review: 04/15/23       Allyn Kenner   BSN, RN  [facility] UM Team

## 2023-04-18 NOTE — Care Coordination-Inpatient (Signed)
Care Transitions Post-Acute Facility Update Call    04/15/23    Patient: Jamie Bryant Patient DOB: 04-05-1941   MRN: [id number]  Reason for Admission: Acute Respiratory failure with hypoxia and hypercapnia, Acute pulmonary edema, pleural effusions, AMS   Discharge Date: 04/01/23 RARS: Readmission Risk Score: 17.1         Care Transitions Post Acute Facility Update    Care Transitions Interventions      Post Acute Facility Update   Post Acute Facility: [facility]    ELOS: 21 days      Nursing   Prescribed diet: diabetic    Nutrition intake assessment: 50-100%   Wounds: Pos   Wound Location: bilatteral lower extremities   Wound Care Therapies: Every other day cleanse with VASHE, apply a piece of Optifoam Gentle NB AG, cover with 4 x 4's and secure with roll gauze and tape.   Labs: Abnormal   Abnormal Lab Notes: 03/01/23 WBC 15.2, RBC 3.70, Hgb 9.1, HCT 29.4, Platelets 470, anion gap 3, glucose 184, BUN 22, BUN/Cre ratio 31   Skilled Medication therapies: none   Medical equipment being used: wheelchair, walker   Disease specific clinical considerations: DM, OSA   Reported Nursing Updates: Plan for repeat chest x-ray in 4-6 weeks,  f/u with pulmonary. Pt continues with oxygen not on CPAP at night. Current vitals wt 4/16 204.0 lbs, 04/13/23@1535  T 97.3, BP 91/45,  P 55, RR 16, O2 Sat 98% on 1LNC, BS 143 4/16@2112 .       Rehab/Functional   Prior Level of Functioning: Patient was modified independent using a rollator for functional mobility.  She reports that she did not use the rollator all of the time in her apartment, only outside the apartment   Cognitive function assessment: impared   ADLs:  (Comment: UB and LB ADL's supervision-CGA)   Transfer Assistance:  (Comment: supervision to CGA)   Ambulation Assistance:  (Comment: supervsion-CGA)   How far (in feet) is the patient ambulating?: 175   Does patient use an assistive device?: Yes   Assistive Devices: 4 wheeled walker   Rehab Notes: Ambulation with rollator  and w/c follow for O2, able to ambulate 50 ft with no supplemental O2 while maintaining >90% O2 sat       SW/Discharge Planning   Goals of Care: Maximum progress in therapy for safe discharge to home   Palliative/Hospice Referral indicated: Neg   Caregiver support: family, home health services   Discharge Planning / Barriers: Pt lives at [facility] [place]. Plan to return to home with home health services, date to be determined.          Allyn Kenner   BSN, RN  [facility] UM Team

## 2023-04-23 NOTE — Care Coordination-Inpatient (Signed)
Care Transitions Post-Acute Facility Update Call    04/22/23    Patient: Jamie Bryant Patient DOB: 1941/08/24   MRN: [id number]  Reason for Admission: Acute Respiratory failure with hypoxia and hypercapnia, Acute pulmonary edema, pleural effusions, AMS   Discharge Date: 04/01/23 RARS: Readmission Risk Score: 17.1         Care Transitions Post Acute Facility Update    Care Transitions Interventions      Post Acute Facility Update   Post Acute Facility: [facility]    ELOS: 21 days      Nursing   Prescribed diet: diabetic    Nutrition intake assessment: 76-100%   Wounds: Pos   Wound Location: bilatteral lower extremities   Wound Care Therapies: Every other day cleanse with VASHE, apply a piece of Optifoam Gentle NB AG, cover with 4 x 4's and secure with roll gauze and tape.   Labs: Abnormal   Abnormal Lab Notes: 03/01/23 WBC 15.2, RBC 3.70, Hgb 9.1, HCT 29.4, Platelets 470, anion gap 3, glucose 184, BUN 22, BUN/Cre ratio 31   Skilled Medication therapies: none   Medical equipment being used: wheelchair, walker   Disease specific clinical considerations: DM, OSA   Reported Nursing Updates: Plan for repeat chest x-ray in 4-6 weeks,  f/u with pulmonary. Pt continues with oxygen not on CPAP at night.04/16/23 BP remains soft, DC norvasc and follow, need to avoid hypotention/falls , cot coerg, lisinopril, torsemide , K+ WNL, ptl legs look great no sign of active infection. 4/23 wt 213.2, current vitals 04/21/23@1600  T 96.4,BP 110/65, P 65, RR 19, O2 sat 100% on RA         Rehab/Functional   Prior Level of Functioning: Patient was modified independent using a rollator for functional mobility.  She reports that she did not use the rollator all of the time in her apartment, only outside the apartment   Cognitive function assessment: Impared   ADLs:  (Comment: Mod Ind for all except bathing min assist)   Bed Mobility: Independent   Transfer Assistance: Stand by Assist - Presence and Cueing   Ambulation Assistance: Stand by  Assist - Presence and Cueing   How far (in feet) is the patient ambulating?: 100   Does patient use an assistive device?: Yes   Assistive Devices: walker   Rehab Notes: Trials of ambulation without oxygen o2 sats >90% for 80 of 100 ft dropped below 90% for last 20 feet. Knee pain at times with sit-stand       SW/Discharge Planning   Goals of Care: Maximum progress in therapy for safe discharge to home   Palliative/Hospice Referral indicated: Neg   Caregiver support: family, Home health services   Anticipated date for discharge: 05/04/23   Discharge Planning / Barriers: Pt lives at [facility] [place]. Plan to return to home with home health services, potential d/c date 05/04/23.        Allyn Kenner   BSN, RN  [facility] UM Team

## 2023-04-30 NOTE — Care Coordination-Inpatient (Signed)
Care Transitions Post-Acute Facility Update Call    04/29/23    Patient: Jamie Bryant Patient DOB: Jul 15, 1941   MRN: [id number]  Reason for Admission: Acute Respiratory failure with hypoxia and hypercapnia, Acute pulmonary edema, pleural effusions, AMS   Discharge Date: 04/01/23 RARS: Readmission Risk Score: 17.1         Care Transitions Post Acute Facility Update    Care Transitions Interventions      Post Acute Facility Update   Post Acute Facility: [facility]    ELOS: 21 days      Nursing   Prescribed diet: diabetic    Nutrition intake assessment: 76-100%   Wounds: Pos   Wound Location: bilatteral lower extremities   Wound Care Therapies: Every other day cleanse with VASHE, apply a piece of Optifoam Gentle NB AG, cover with 4 x 4's and secure with roll gauze and tape   Labs: Abnormal   Abnormal Lab Notes: 03/01/23 WBC 15.2, RBC 3.70, Hgb 9.1, HCT 29.4, Platelets 470, anion gap 3, glucose 184, BUN 22, BUN/Cre ratio 31   Skilled Medication therapies: none   Medical equipment being used: wheelchair, walker   Disease specific clinical considerations: DM, OSA   Reported Nursing Updates:  Plan for repeat chest x-ray in 4-6 weeks,  f/u with pulmonary. Pt continues with oxygen not on CPAP at night. Current vitals. Vitals 04/27/23 T 98.0, BP 100/47, P 68, RR 20, 04/29/23 O2 sat 96% on RA.       Rehab/Functional   Prior Level of Functioning: Patient was modified independent using a rollator for functional mobility.  She reports that she did not use the rollator all of the time in her apartment, only outside the apartment   Cognitive function assessment: Impaired   ADLs:  (Comment: UB and LB ADL's Mod Independent)   Ambulation Assistance:  (Comment: supervision on uneven surfaces)   Does patient use an assistive device?: Yes   Assistive Devices: Rollator Mod I on even surfaces for ambulation, supervision on uneven surfaces   Rehab Notes: OT focusing on IADL's        SW/Discharge Planning   Goals of Care: Maximum  progress in therapy for safe discharge to home   Palliative/Hospice Referral indicated: Neg   Caregiver support: family, Home health services   Anticipated date for discharge: 05/07/23   Discharge Planning / Barriers:  Pt lives at [facility] [place] Plan to return to home with home health services, potential d/c date 05/07/23, barrier [place] is in [place] lives alone.   Care Progression on Track?: Neg  If No, Plan for Intervention: continue skilled services OT focusing on IADL's  Next IDT Planned Review: 05/06/23       Allyn Kenner   BSN, RN  [facility] UM Team

## 2023-05-07 NOTE — Care Coordination-Inpatient (Signed)
Care Transitions Post-Acute Facility Update Call    05/06/23    Patient: Jamie Bryant Patient DOB: 03-28-1941   MRN: [id number]  Reason for Admission: Acute Respiratory failure with hypoxia and hypercapnia, Acute pulmonary edema, pleural effusions, AMS   Discharge Date: 04/01/23 RARS: Readmission Risk Score: 17.1         Care Transitions Post Acute Facility Update    Care Transitions Interventions      Post Acute Facility Update   Post Acute Facility: [facility]    ELOS: 21 days      Nursing   Prescribed diet: diabetic    Nutrition intake assessment: good   Wounds: Pos   Wound Location: bilatteral lower extremities   Wound Care Therapies: Every other day cleanse with VASHE, apply a piece of Optifoam Gentle NB AG, cover with 4 x 4's and secure with roll gauze and tape   Skilled Medication therapies: none   Medical equipment being used: wheelchair, walker   Disease specific clinical considerations: DM, OSA   Reported Nursing Updates: Pain managed with tylenol,   Plan for repeat chest x-ray in 4-6 weeks,  f/u with pulmonary. Current vitals 05/05/23 T 97.2, BP 117/56, P 69, RR 16, O2 sat 98% on RA. Pt with increased confusion and decrease safety during ADL and mobility, results of UA pending.       Rehab/Functional   Prior Level of Functioning: Patient was modified independent using a rollator for functional mobility.  She reports that she did not use the rollator all of the time in her apartment, only outside the apartment   Cognitive function assessment: Impaired   ADLs: Stand by Assist - Presence and Cueing   Ambulation Assistance: Stand by Assist - Presence and Cueing   How far (in feet) is the patient ambulating?: 100   Does patient use an assistive device?: Yes   Assistive Devices: walker   Rehab Notes: increased confusion and decreased safety during ADL and mobility. Results of UA pending. Pt also with nausea on 5/3 and increased fatigue since then.            SW/Discharge Planning   Goals of Care: Maximum  progress in therapy for safe discharge to home   Palliative/Hospice Referral indicated: Neg   Caregiver support: home health services   Anticipated date for discharge: 05/07/23   Discharge Planning / Barriers: Pt lives at [facility] [place] Plan to return to home with home health services.    Care Progression on Track?: Pos       Allyn Kenner   BSN, RN  [facility] UM Team

## 2023-05-08 NOTE — Care Coordination-Inpatient (Signed)
Care Transitions Initial Follow Up Call    Call within 2 business days of discharge: Yes    Patient Current Location:  [place]    Post Acute Care Manager attempted to contact  the patient and family by telephone to perform post hospital discharge assessment. Was unable to reach, have left message to return this [facility] RN's call  Patient: Jamie Bryant Patient DOB: 25-Mar-1941   MRN: [id number]  Reason for Admission: Acute Respiratory failure with hypoxia and hypercapnia, Acute pulmonary edema, pleural effusions, AMS     Discharge Date: 04/01/23 RARS: Readmission Risk Score: 17.1    Home Health services have been confirmed with Morrie Sheldon at [facility], pt was seen by Nursing today and scheduled to be seen by PT today, also has OT services.  Last Discharge Facility       Date Complaint Diagnosis Description Type Department Provider    03/25/23 Shortness of Breath Acute pulmonary edema (HCC) ... ED to Hosp-Admission (Discharged) (ADMITTED) [id number] Simonne Maffucci, Theophilus Kinds, MD; Melynda Keller...            Was this an external facility discharge? Yes, 05/07/23  Dis[facility] Facility: [facility]        Allyn Kenner, [place]

## 2023-05-08 NOTE — Care Coordination-Inpatient (Unsigned)
Care Transitions Initial Follow Up Call    Call within 2 business days of discharge: Yes    Patient Current Location:  [place]    Post Acute Care Manager contacted the patient by telephone to perform post hospital discharge assessment. Verified name and DOB with patient as identifiers. Provided introduction to self, and explanation of the Post Acute Care Manager role.     Patient: Jamie Bryant Patient DOB: 1941/09/01   MRN: [id number]  Reason for Admission: Acute Respiratory failure with hypoxia and hypercapnia, Acute pulmonary edema, pleural effusions, AMS   Discharge Date: 04/01/23 RARS: Readmission Risk Score: 17.1      Last Discharge Facility       Date Complaint Diagnosis Description Type Department Provider    03/25/23 Shortness of Breath Acute pulmonary edema (HCC) ... ED to Hosp-Admission (Discharged) (ADMITTED) [id number] Simonne Maffucci, Theophilus Kinds, MD; Melynda Keller...            Was this an external facility discharge? Yes, 05/07/2023  Discharge Facility: [facility]    Challenges to be reviewed by the provider   Additional needs identified to be addressed with provider: Yes  medications-started lisinopril  at [facility] on 04/08/23 pt was not taking Quinapril at SNF plan to get fill this medication over the weekend,  does not have lantus plan to fill this medication over the weekend.               Method of communication with provider: chart routing.    Pt discharged from [facility] on 05/07/23 from [facility], states its a "gradual process, got a few hours of rest and feeling stronger now. Pt will schedule a PCP follow up appointment, and states she will have transportation to get to appointment.  Home Health services have been scheduled with [facility] with start of services on 05/08/23. Pt confirmed SN and PT saw her today.      Post Acute Care Manager reviewed discharge instructions and medical action plan with patient who verbalized understanding. The patient was given an  opportunity to ask questions and does not have any further questions or concerns at this time. Were discharge instructions available to patient? Yes. Reviewed appropriate site of care based on symptoms and resources available to patient including: PCP  Home health  When to call 911  Pt also has dispatch health information . The patient agrees to contact the PCP office for questions related to their healthcare.     Advance Care Planning:   Does patient have an Advance Directive: reviewed and current.    Medication reconciliation was performed with patient, who verbalizes understanding of administration of home medications. Medications reviewed.  Was patient discharged with a pulse oximeter? no    Non-face-to-face services provided:  Communication with home health agencies or other community services the patient is currently using-[facility]    Offered patient enrollment in the Remote Patient Monitoring (RPM) program for in-home monitoring: .No, will speak with pt during follow call     Care Transitions 24 Hour Call    Do you have all of your prescriptions and are they filled?: No  Post Acute Services: Home Health  Do you have support at home?: Alone  Are you an active caregiver in your home?: No  Care Transitions Interventions         Discussed follow-up appointments. If no appointment was previously scheduled, appointment scheduling offered: Yes.   Is follow up appointment scheduled  within 7 days of discharge? Pt will schedule f/u appointment with PCP.    Follow Up  No future appointments.    Post Acute Care Manager provided contact information.  Plan for follow-up call in 1-2 days based on severity of symptoms and risk factors.  Plan for next call: medication management, to see if follow up PCP appointment has been made.    Allyn Kenner, RN

## 2023-05-11 NOTE — Care Coordination-Inpatient (Signed)
Care Transitions Follow Up Call    Patient Current Location:  [place]    Post Acute Care Manager contacted the patient by telephone to follow up after admission on 3/27-03/31/22.  Verified name and DOB with patient as identifiers.    Patient: Jamie Bryant  Patient DOB: [birth year]   MRN: [id number]  Reason for Admission: Acute Respiratory failure with hypoxia and hypercapnia, Acute pulmonary edema, pleural effusions, AMS   Discharge Date: 04/01/23 RARS: Readmission Risk Score: 17.1      Needs to be reviewed by the provider   Additional needs identified to be addressed with provider: Yes  medications-Pt filled and now has Lantus insulin and lisinopril.    Would you office contact pt to schedule PCP follow up, pt has not made the appointment yet           Method of communication with provider: chart routing.        Addressed changes since last contact:  medications-Pt has filled Rx for Lantus and lisinopril  Discussed follow-up appointments. If no appointment was previously scheduled, appointment scheduling offered: Yes.   Is follow up appointment scheduled within 7 days of discharge? Pt to schedule.    Follow Up  No future appointments.  External follow up appointment(s):     Patients top risk factors for readmission: medical condition-   Interventions to address risk factors:  sent a message to PCP asked for office to contact pt to make f/u appointment.       Care Transitions Subsequent and Final Call    Subsequent and Final Calls  Are you currently active with any services?: Home Health  Care Transitions Interventions  Other Interventions:             Post Acute Care Manager provided contact information for future needs. No further follow-up call indicated based on severity of symptoms and risk factors.  Plan for next call: referral to ambulatory care manager-     Allyn Kenner, RN

## 2023-05-13 NOTE — Care Coordination-Inpatient (Signed)
05/13/23  ACM reached out for enrollment. Unable to reach pt at this time and left voicemail with call back number. Will call again later this week.

## 2023-05-18 NOTE — Care Coordination-Inpatient (Signed)
Ambulatory Care Coordination Note     05/18/2023 10:20 AM     Patient Current Location:  [place]     This patient was received as a referral from Care Transition Nurse.    ACM contacted the patient by telephone. Verified name and DOB with patient as identifiers. Provided introduction to self, and explanation of the ACM role.   Patient accepted care management services at this time.          ACM: Cecil Cranker, RN     Challenges to be reviewed by the provider   Additional needs identified to be addressed with provider No  none               Method of communication with provider: none.    Care Summary Note: Pt accepts ACM services. Pt reports that she feels the wound on her legs is getting worse. She reports wound care should be coming out today. She reports that she will be calling Dr Moshe Salisbury office to make an appt today and that she has questions about her new medications. She will review the medications with her home health nurse and ACM will follow on these points with the pt next week.     Offered patient enrollment in the Remote Patient Monitoring (RPM) program for in-home monitoring: Yes, but did not enroll at this time: will discuss at next encounter .     Assessments Completed:   No changes since last call    Medications Reviewed:   Not completed during this call: will complete at next encounter    Advance Care Planning:   Not reviewed during this call     Care Planning:   Not completed during this call    PCP/Specialist follow up:       Follow Up:   Plan for next ACM outreach in approximately 1 week to complete:  - CC Protocol assessments  - disease specific assessments  - SDOH assessments  - medication review   - advance care planning   - goal progression  - education   - RPM.   patient  is agreeable to this plan.

## 2023-05-26 NOTE — Care Coordination-Inpatient (Signed)
Ambulatory Care Coordination Note     05/26/2023 11:02 AM     Patient Current Location:  [place]     ACM contacted the patient by telephone. Verified name and DOB with patient as identifiers.         ACM: Cecil Cranker, RN     Challenges to be reviewed by the provider   Additional needs identified to be addressed with provider No  none               Method of communication with provider: none.    Care Summary Note: Pt reports she is doing well with her chief complaint being low energy. She reports she takes naps throughout the day and sometimes does not wake up until the afternoon. She reports that her cousin takes her to her outside appts and and is not involved in church or other organizations though this is a concern to her. ACM reviewed upcoming appts with pt and pt reports that she understands and that her cousin will take her to these appts. Pt declined to review medications at this time but medications were reviewed by Allyn Kenner, RN on 05/08/23. Pt has no other needs or concerns at this time.     Ambulatory Care Coordination Note     05/26/2023 11:06 AM       Offered patient enrollment in the Remote Patient Monitoring (RPM) program for in-home monitoring: Yes, but did not enroll at this time: limited patient ability to navigate RPM/equipment.     Assessments Completed:       05/26/2023    10:48 AM   Amb Fall Risk Assessment and TUG Test   Do you feel unsteady or are you worried about falling?  no   2 or more falls in past year? no   Fall with injury in past year? no    ,   Ambulatory Care Coordination Assessment    Care Coordination Protocol  Referral from Primary Care Provider: No  Week 1 - Initial Assessment     Do you have all of your prescriptions and are they filled?: No  Are you able to afford your medications?: Yes  How often do you have trouble taking your medications the way you have been told to take them?: I always take them as prescribed.     Do you have Home O2 Therapy?: No      Ability to  seek help/take action for Emergent Urgent situations i.e. fire, crime, inclement weather or health crisis.: Independent  Ability to ambulate to restroom: Independent  Ability handle personal hygeine needs (bathing/dressing/grooming): Independent  Ability to manage Medications: Independent  Ability to prepare Food Preparation: Independent  Ability to maintain home (clean home, laundry): Independent  Ability to drive and/or has transportation: Independent  Ability to do shopping: Independent  Ability to manage finances: Independent  Is patient able to live independently?: Yes     Current Housing: Independent Living/Senior Housing  Who do you live with?: Alone  Are you an active caregiver in your home?: No     Do you have any DME?: No     Per the Fall Risk Screening, did the patient have 2 or more falls or 1 fall with injury in the past year?: No     Frequent urination at night?: No  Do you use rails/bars?: No  Do you have a non-slip tub mat?: No     Are you experiencing loss of meaning?: No  Are you experiencing loss of hope  and peace?: No     Thinking about your patient's physical health needs, are there any symptoms or problems (risk indicators) you are unsure about that require further investigation?: Mild vague physical symptoms or problems; but do not impact on daily life or are not of concern to patient   Are the patient's physical health problems impacting on their mental well-being?: Mild impact on mental well-being e.g. ""feeling fed-up"", ""reduced enjoyment""   Are there any problems with your patient's lifestyle behaviors (alcohol, drugs, diet, exercise) that are impacting on physical or mental well-being?: Some mild concern of potential negative impact on well-being   Do you have any other concerns about your patient's mental well-being? How would you rate their severity and impact on the patient?: Mild problems - don't interfere with function   How would you rate their home environment in terms of safety  and stability (including domestic violence, insecure housing, neighbor harassment)?: Consistently safe, supportive, stable, no identified problems   How do daily activities impact on the patient's well-being? (include current or anticipated unemployment, work, caregiving, access to transportation or other): Some general dissatisfaction but no concern   How would you rate their social network (family, work, friends)?: Adequate participation with social networks   How would you rate their financial resources (including ability to afford all required medical care)?: Financially secure, some resource challenges   How wells does the patient now understand their health and well-being (symptoms, signs or risk factors) and what they need to do to manage their health?: Little understanding which impacts on their ability to undertake better management   How well do you think your patient can engage in healthcare discussions? (Barriers include language, deafness, aphasia, alcohol or drug problems, learning difficulties, concentration): Some difficulties in communication with or without moderate barriers   Do other services need to be involved to help this patient?: Other care/services in place and adequate   Are current services involved with this patient well-coordinated? (Include coordination with other services you are now recommendation): Required care/services in place and adequately coordinated   Suggested Interventions and Community Resources  Fall Risk Prevention: In Process Physical Therapy: In Process   Other Therapy Services: In Process   Other Services: In Process         Other Interventions: Set up/Review an Education Plan, Set up/Review Goals           ,   Care Coordination Interventions    Referral from Primary Care Provider: No  Suggested Interventions and Community Resources  Fall Risk Prevention: In Process  Physical Therapy: In Process  Other Therapy Services: In Process  Other Services: In Process      ,    Diabetes Assessment    Medic Alert ID: No  Meal Planning: Avoidance of concentrated sweets   How often do you test your blood sugar?: Daily   Do you have barriers with adherence to non-pharmacologic self-management interventions? (Nutrition/Exercise/Self-Monitoring): No   Have you ever had to go to the ED for symptoms of low blood sugar?: No       Do you have hyperglycemia symptoms?: No   Do you have hypoglycemia symptoms?: No   Blood Sugar Monitoring Regimen: Not Testing   Blood Sugar Trends: No Change         ,   General Assessment    Do you have any symptoms that are causing concern?: Yes  Reported Symptoms: Weakness      , and   Social Determinants of Health  Tobacco Use: Low Risk  (03/19/2023)    Patient History     Smoking Tobacco Use: Never     Smokeless Tobacco Use: Never     Passive Exposure: Never   Alcohol Use: Not At Risk (05/26/2023)    AUDIT-C     Frequency of Alcohol Consumption: Never     Average Number of Drinks: Patient does not drink     Frequency of Binge Drinking: Never   Financial Resource Strain: Low Risk  (05/26/2023)    Overall Financial Resource Strain (CARDIA)     Difficulty of Paying Living Expenses: Not very hard   Food Insecurity: No Food Insecurity (05/26/2023)    Hunger Vital Sign     Worried About Running Out of Food in the Last Year: Never true     Ran Out of Food in the Last Year: Never true   Transportation Needs: No Transportation Needs (05/26/2023)    PRAPARE - Therapist, art (Medical): No     Lack of Transportation (Non-Medical): No   Physical Activity: Inactive (05/26/2023)    Exercise Vital Sign     Days of Exercise per Week: 0 days     Minutes of Exercise per Session: 0 min   Stress: No Stress Concern Present (05/26/2023)    [employer] of Occupational Health - Occupational Stress Questionnaire     Feeling of Stress : Only a little   Social Connections: Socially Isolated (05/26/2023)    Social Connection and Isolation Panel [NHANES]      Frequency of Communication with Friends and Family: Once a week     Frequency of Social Gatherings with Friends and Family: Once a week     Attends Religious Services: Never     Database administrator or Organizations: No     Attends Banker Meetings: Never     Marital Status: Divorced   Catering manager Violence: Not At Risk (05/26/2023)    Humiliation, Afraid, Rape, and Kick questionnaire     Fear of Current or Ex-Partner: No     Emotionally Abused: No     Physically Abused: No     Sexually Abused: No   Depression: Not at risk (05/26/2023)    PHQ-2     PHQ-2 Score: 0   Housing Stability: Low Risk  (05/26/2023)    Housing Stability Vital Sign     Unable to Pay for Housing in the Last Year: No     Number of Places Lived in the Last Year: 1     Unstable Housing in the Last Year: No   Interpersonal Safety: Not At Risk (05/26/2023)    Humiliation, Afraid, Rape, and Kick questionnaire     Fear of Current or Ex-Partner: No     Emotionally Abused: No     Physically Abused: No     Sexually Abused: No   Utilities: Not At Risk (05/26/2023)    [facility] Utilities     Threatened with loss of utilities: No        Medications Reviewed:   Completed during a previous call     Advance Care Planning:   Reviewed and current     Care Planning:   Education Documentation  Chronic Complications, taught by Cecil Cranker, RN at 05/26/2023 10:59 AM.  Learner: Patient  Readiness: Acceptance  Method: Explanation  Response: Needs Reinforcement    Educate activity level, taught by Cecil Cranker, RN at 05/26/2023 10:59 AM.  Learner: Patient  Readiness: Acceptance  Method: Explanation  Response: Needs Reinforcement    COGNITIVE : DIABETES-HYPERGLYCEMIA, taught by Cecil Cranker, RN at 05/26/2023 10:59 AM.  Learner: Patient  Readiness: Acceptance  Method: Explanation  Response: Needs Reinforcement    Diet, taught by Cecil Cranker, RN at 05/26/2023 10:59 AM.  Learner: Patient  Readiness: Acceptance  Method: Explanation  Response: Needs  Reinforcement    Signs and Symptoms of Hypoglycemia, taught by Cecil Cranker, RN at 05/26/2023 10:59 AM.  Learner: Patient  Readiness: Acceptance  Method: Explanation  Response: Needs Reinforcement    Signs and Symptoms of Hyperglycemia, taught by Cecil Cranker, RN at 05/26/2023 10:59 AM.  Learner: Patient  Readiness: Acceptance  Method: Explanation  Response: Needs Reinforcement    Introduction to diabetes, taught by Cecil Cranker, RN at 05/26/2023 10:59 AM.  Learner: Patient  Readiness: Acceptance  Method: Explanation  Response: Needs Reinforcement    Education Comments  No comments found.     ,    Goals Addressed                   This Visit's Progress     Conditions and Symptoms        I will schedule office visits, as directed by my provider.  I will keep my appointment or reschedule if I have to cancel.  I will notify my provider of any barriers to my plan of care.  I will notify my provider of any symptoms that indicate a worsening of my condition.    Barriers: overwhelmed by complexity of regimen and lack of education  Plan for overcoming my barriers: ACM will provide support, education, and coordination of resources that may contribute to improvement of patient's condition.   Confidence: 7/10  Anticipated Goal Completion Date: 08/26/23    05/26/23  Reviewed pt upcoming appts and pt reports she has transportation to these appts and is able to keep them. EA         COMPLETED: Medication Management        I will take my medication as directed.  I will notify my provider of any problems with medications, like adverse effects or side effects.  I will notify my provider for advice before I stop taking any of my medication.    Barriers: impairment:  visual and lack of support  Plan for overcoming my barriers: Home Health nurse to review meds with patient in person, encourage patient to call cardio if concerns re bp meds, get magnifying glass to read rx labels-ask pharmacy to print larger labels for her  Confidence:  7/10  Anticipated Goal Completion Date: 01/19/23  01/01/23  Says sob seems better, only occasional wheezing. Taking meds.mbt  01/12/23  Reports not great today, still has some pain in leg. Nurse from [facility] has told her to stay off leg to decrease the edema. Sees pcp again 1/31. SOB only when up and active.mbt               PCP/Specialist follow up:   Future Appointments         Provider Specialty Dept Phone    06/04/2023 9:45 AM Ernestina Penna, MD Wound Ostomy [phone]    06/09/2023 12:30 PM Vianne Bulls, MD Internal Medicine [phone]            Follow Up:   Plan for next ACM outreach in approximately 3 weeks to complete:  - goal progression  - education .   patient  is agreeable to this plan.

## 2023-06-04 ENCOUNTER — Inpatient Hospital Stay: Payer: MEDICARE | Attending: Radiation Oncology | Primary: Internal Medicine

## 2023-06-09 ENCOUNTER — Ambulatory Visit: Admit: 2023-06-09 | Discharge: 2023-06-09 | Payer: MEDICARE | Attending: Internal Medicine | Primary: Internal Medicine

## 2023-06-09 DIAGNOSIS — I872 Venous insufficiency (chronic) (peripheral): Secondary | ICD-10-CM

## 2023-06-09 NOTE — Assessment & Plan Note (Signed)
Monitored by specialist- no acute findings meriting change in the plan

## 2023-06-09 NOTE — Progress Notes (Signed)
Chief Complaint   Patient presents with    Follow-Up from Hospital     Wound care both legs     "Have you been to the ER, urgent care clinic since your last visit?  Hospitalized since your last visit?"    YES - When: approximately 3 months ago.  Where and Why: [facility] pulmonary endemia .    "Have you seen or consulted any other health care providers outside of [facility] since your last visit?"    NO            Click Here for Release of Records Request

## 2023-06-10 LAB — HEMOGLOBIN A1C
Estimated Avg Glucose: 134 mg/dL
Hemoglobin A1C: 6.3 % — ABNORMAL HIGH (ref 4.0–5.6)

## 2023-06-10 LAB — LIPID PANEL
Chol/HDL Ratio: 2.9 (ref 0.0–5.0)
Cholesterol, Total: 150 MG/DL (ref <200)
HDL: 52 MG/DL
LDL Cholesterol: 71.2 MG/DL (ref 0–100)
Triglycerides: 134 MG/DL (ref <150)
VLDL Cholesterol Calculated: 26.8 MG/DL

## 2023-06-10 LAB — CBC WITH AUTO DIFFERENTIAL
Basophils %: 1 % (ref 0–1)
Basophils Absolute: 0.1 10*3/uL (ref 0.0–0.1)
Eosinophils %: 3 % (ref 0–7)
Eosinophils Absolute: 0.3 10*3/uL (ref 0.0–0.4)
Hematocrit: 31.3 % — ABNORMAL LOW (ref 35.0–47.0)
Hemoglobin: 9.4 g/dL — ABNORMAL LOW (ref 11.5–16.0)
Immature Granulocytes %: 1 % — ABNORMAL HIGH (ref 0.0–0.5)
Immature Granulocytes Absolute: 0 10*3/uL (ref 0.00–0.04)
Lymphocytes %: 28 % (ref 12–49)
Lymphocytes Absolute: 2.3 10*3/uL (ref 0.8–3.5)
MCH: 24.6 PG — ABNORMAL LOW (ref 26.0–34.0)
MCHC: 30 g/dL (ref 30.0–36.5)
MCV: 81.9 FL (ref 80.0–99.0)
MPV: 12.3 FL (ref 8.9–12.9)
Monocytes %: 6 % (ref 5–13)
Monocytes Absolute: 0.5 10*3/uL (ref 0.0–1.0)
Neutrophils %: 61 % (ref 32–75)
Neutrophils Absolute: 5.2 10*3/uL (ref 1.8–8.0)
Nucleated RBCs: 0 PER 100 WBC
Platelets: 439 10*3/uL — ABNORMAL HIGH (ref 150–400)
RBC: 3.82 M/uL (ref 3.80–5.20)
RDW: 16.5 % — ABNORMAL HIGH (ref 11.5–14.5)
WBC: 8.4 10*3/uL (ref 3.6–11.0)
nRBC: 0 10*3/uL (ref 0.00–0.01)

## 2023-06-10 LAB — COMPREHENSIVE METABOLIC PANEL
ALT: 20 U/L (ref 12–78)
AST: 17 U/L (ref 15–37)
Albumin/Globulin Ratio: 1.1 (ref 1.1–2.2)
Albumin: 3.6 g/dL (ref 3.5–5.0)
Alk Phosphatase: 105 U/L (ref 45–117)
Anion Gap: 3 mmol/L — ABNORMAL LOW (ref 5–15)
BUN/Creatinine Ratio: 24 — ABNORMAL HIGH (ref 12–20)
BUN: 19 MG/DL (ref 6–20)
CO2: 29 mmol/L (ref 21–32)
Calcium: 10.2 MG/DL — ABNORMAL HIGH (ref 8.5–10.1)
Chloride: 108 mmol/L (ref 97–108)
Creatinine: 0.79 MG/DL (ref 0.55–1.02)
Est, Glom Filt Rate: 76 mL/min/{1.73_m2} (ref >60)
Globulin: 3.3 g/dL (ref 2.0–4.0)
Glucose: 119 mg/dL — ABNORMAL HIGH (ref 65–100)
Potassium: 4.6 mmol/L (ref 3.5–5.1)
Sodium: 140 mmol/L (ref 136–145)
Total Bilirubin: 0.3 MG/DL (ref 0.2–1.0)
Total Protein: 6.9 g/dL (ref 6.4–8.2)

## 2023-06-17 NOTE — Care Coordination-Inpatient (Signed)
Ambulatory Care Coordination Note     06/17/2023 10:32 AM     patient outreach attempt by this ACM today to perform care management follow up . ACM was unable to reach the patient by telephone today; left voice message requesting a return phone call to this ACM.     ACM: Cecil Cranker, RN       PCP/Specialist follow up:   Future Appointments         Provider Specialty Dept Phone    06/18/2023 9:15 AM Ernestina Penna, MD Wound Ostomy [phone]    07/10/2023 1:30 PM Vianne Bulls, MD Internal Medicine [phone]            Follow Up:   Plan for next ACM outreach in approximately 1 week to complete:  - goal progression  - education .

## 2023-06-18 ENCOUNTER — Inpatient Hospital Stay: Admit: 2023-06-18 | Discharge: 2023-06-18 | Payer: MEDICARE | Attending: Radiation Oncology | Primary: Internal Medicine

## 2023-06-18 DIAGNOSIS — I872 Venous insufficiency (chronic) (peripheral): Secondary | ICD-10-CM

## 2023-06-18 NOTE — Other (Signed)
06/18/23 0855   Anesthetic   Anesthetic 4% Lidocaine Liquid Topical   Right Leg Edema Point of Measurement   Leg circumference 42 cm   Ankle circumference 22 cm   Left Leg Edema Point of Measurement   Leg circumference 44 cm   Ankle circumference 23 cm   Peripheral Vascular   RLE Edema +1;Pitting   LLE Edema +1;Non-pitting   RLE Neurovascular Assessment   Capillary Refill Less than/Equal to 3 seconds   Color Appropriate for Ethnicity   Temperature Warm   RLE Sensation  Full sensation   R Pedal Pulse Doppler   LLE Neurovascular Assessment   Capillary Refill Less than/Equal to 3 seconds   Color Appropriate for Ethnicity   Temperature Warm   LLE Sensation  Full sensation   L Pedal Pulse Doppler   Wound 06/18/23 Leg Right Circumferential   Date First Assessed/Time First Assessed: 06/18/23 0900   Present on Original Admission: Yes  Wound Approximate Age at First Assessment (Weeks): 4 weeks  Primary Wound Type: Venous Ulcer  Location: Leg  Wound Location Orientation: Right  Wound Descript...   Wound Image     Wound Etiology Venous   Dressing Status Other (Comment)  (OTA)   Wound Cleansed Soap and water   Offloading for Diabetic Foot Ulcers Offloading not required   Wound Length (cm) 15.5 cm   Wound Width (cm) 25 cm   Wound Depth (cm) 0.2 cm   Wound Surface Area (cm^2) 387.5 cm^2   Wound Volume (cm^3) 77.5 cm^3   Wound Assessment Pink/red;Slough   Drainage Amount Copious (>75 % saturated)   Drainage Description Serosanguinous   Odor None   Peri-wound Assessment Hemosiderin staining (brown yellow);Hypopigmented;Fragile   Margins Undefined edges   Wound Thickness Description not for Pressure Injury Full thickness   Wound 06/18/23 Leg Left Cluster   Date First Assessed/Time First Assessed: 06/18/23 0902   Present on Original Admission: Yes  Wound Approximate Age at First Assessment (Weeks): 4 weeks  Primary Wound Type: Venous Ulcer  Location: Leg  Wound Location Orientation: Left  Wound Descripti...   Wound Image       Wound Etiology Venous   Dressing Status Other (Comment)  (OTA)   Wound Cleansed Soap and water   Offloading for Diabetic Foot Ulcers Offloading not required   Wound Length (cm) 17.2 cm   Wound Width (cm) 8 cm   Wound Depth (cm) 0.1 cm   Wound Surface Area (cm^2) 137.6 cm^2   Wound Volume (cm^3) 13.76 cm^3   Wound Assessment Pink/red;Fluid filled blister;Ruptured blister   Drainage Amount Large (50-75% saturated)   Drainage Description Serosanguinous   Odor None   Peri-wound Assessment Fragile;Hemosiderin staining (brown yellow)   Margins Undefined edges   Wound Thickness Description not for Pressure Injury Partial thickness   Wound 06/18/23 Leg Lower;Right;Lateral   Date First Assessed/Time First Assessed: 06/18/23 1610   Present on Original Admission: Yes  Wound Approximate Age at First Assessment (Weeks): 4 weeks  Primary Wound Type: Venous Ulcer  Location: Leg  Wound Location Orientation: Lower;Right;Lateral   Wound Image    Wound Etiology Venous   Dressing Status Other (Comment)  (OTA)   Wound Cleansed Soap and water   Offloading for Diabetic Foot Ulcers Offloading not required   Wound Length (cm) 1.2 cm   Wound Width (cm) 1.4 cm   Wound Depth (cm) 0.1 cm   Wound Surface Area (cm^2) 1.68 cm^2   Wound Volume (cm^3) 0.168 cm^3   Wound  Assessment Pink/red;Slough   Drainage Amount Copious (>75 % saturated)   Drainage Description Serosanguinous   Odor None   Peri-wound Assessment Intact   Margins Defined edges   Wound Thickness Description not for Pressure Injury Full thickness   Pain Assessment   Pain Assessment None - Denies Pain   BP (!) 142/59   Pulse 85   Temp 97.7 F (36.5 C) (Temporal)   Resp 18   Ht 1.626 m (5\' 4" )   Wt 99.8 kg (220 lb)   BMI 37.76 kg/m

## 2023-06-18 NOTE — Progress Notes (Signed)
[facility]   Wound Care and [facility] Center   Medical Staff Progress Note     Jamie Bryant  MEDICAL RECORD NUMBER:  [id number]  AGE: 82 y.o.   GENDER: female  DOB: 1941-06-04  EPISODE DATE:  06/18/2023    Chief complaint and reason for visit:     Chief Complaint   Patient presents for evaluation of her recurrent bilateral lower leg venous ulcers    wound care     "wound care for both legs"      Patient presenting for follow up evaluation of wound(s) per chief complaint.  Patient not a clear historian.    Subjective and ROS: Jamie Bryant,  a 82 yo female with history of DM 2; HTN, CVA CHF, Parkinson's, delusion, chronic asthma, alleged dementia, CRF and chronic venous insufficiency of the lower legs, returns to the [facility] at the request of her PCP, [name],  for evaluation of her recurrent bilateral lower leg venous ulcers.   Patient was seen back in March, 2024 for wound care to her lower legs with venous ulcers. She failed to return for follow up after 3 visits and wounds incompletely healed.      Most recently according to the patient she was hospitalized at a [facility] hospital for CHF and then transferred to a nursing facility ([facility]).   After discharge or during her medical care, she developed new bilateral lower leg venous ulcers spontaneously. She complains of pain but cannot quantitated; has heavy drainage without odor. Has not use any compression stockings of late.  Claims she has some Santyl at home that was ordered earlier this year but never used.       Has diabetes and unclear about her A1c.  Chart shows A1C of 6.3 on 06/09/23.  Denies smoking.  Patient states she is allergic to Lidocaine and decline to have it used today.     History of Wound Context: Per original history and physical on this patient. Changes in history since last evaluation: none    Medical Decision Making:     Problem List Items Addressed This Visit    None      Wounds and Treatment Plan:  Bilateral  lower legs - venous ulcers.with mild erythema and hyperpigmented dermal changes; Areas of new blisters noted in the right lower leg.    Left lower leg multiple wounds - clusters of new and older lesions with chronic skin changes. Blisters that are clear fluid filled; others ruptured stage with slough . Copious drainage; non odorous.  No increased warmth; No signs of active infection. Hyperpigmented with mild to mild erythema. Hypopigmented in the posterior /calf- unchanged.   No purulent drainage. Patient sensitive to lidocaine and thus painful to touch to evaluate.  Barely can use a guaze to gently remove the slough.    Right lower leg - similar to the left with scattered blisters, may fluid filled and others ruptured associated with slough, erythema.  Surrounding skin with hemosiderin stained fragile skin.  Mild erythema without increased warmth.  Does not appear with infection.  Treatment - Debridement is recommended; Patient consented with her understanding that the risks for infection, bleeding and pain are possible.  After part way through the procedure with gentle debridement, she asked to have the procedure to be discontinued due to pain.  This was stopped.    Wound to be  cleansed with baby shampoo.  Since she has santyl at home, we recommended she apply  santyl over  the wounds ; cover the hydrofera blue then superabsorbent dressing since she has excessive drainage.  Double layer tubigrip fo mild compression.  History of CHF.   FU in 1 week     Other associated diagnoses or problems addressed:  none    Pertinent imaging reviewed including independent interpretation include:  None    Pertinent labs reviewed.   Medical records and review of external note (s) from other providers done as well.    New lab or imaging orders placed:  None     Prescription drug management: N/A     Discussion of management or test interpretation with  patient who comes alone .    Comorbid conditions affecting wound healing: As per  PMH which was reviewed.    Risk of complications and/or mortality of patient management:  This patient has a low risk of morbidity and mortality from additional diagnostic testing or treatment. This is due to the above conditions affecting wound healing as well as patient and procedure risk factors. Education and discussion held with patient regarding these disease processes pertinent to wound(s).  Other pertinent decisions include: minor surgery or procedures as below.  The patient's diagnosis or treatment is  significantly limited by social determinants of health as noted by:  sensitivity to lidocaine, and unclear level of understanding due to her delusion.  Able to recite the dressing process. .  No family members present. .    Wound 12/15/22 Leg Left;Lower;Anterior (Active)   Number of days: 185       Wound 02/16/23 Pretibial Distal;Left;Medial (Active)   Number of days: 122       Wound 03/19/23 Pretibial Right;Medial cluster (Active)   Number of days: 92       Wound 03/26/23 Pretibial Right (Active)   Number of days: 84       Wound 03/26/23 Pretibial Left (Active)   Number of days: 84       Wound 06/18/23 Leg Right Circumferential (Active)   Wound Image    06/18/23 0855   Wound Etiology Venous 06/18/23 0855   Dressing Status Other (Comment) 06/18/23 0855   Wound Cleansed Soap and water 06/18/23 0855   Offloading for Diabetic Foot Ulcers Offloading not required 06/18/23 0855   Wound Length (cm) 15.5 cm 06/18/23 0855   Wound Width (cm) 25 cm 06/18/23 0855   Wound Depth (cm) 0.2 cm 06/18/23 0855   Wound Surface Area (cm^2) 387.5 cm^2 06/18/23 0855   Wound Volume (cm^3) 77.5 cm^3 06/18/23 0855   Wound Assessment Pink/red;Slough 06/18/23 0855   Drainage Amount Copious (>75 % saturated) 06/18/23 0855   Drainage Description Serosanguinous 06/18/23 0855   Odor None 06/18/23 0855   Peri-wound Assessment Hemosiderin staining (brown yellow);Hypopigmented;Fragile 06/18/23 0855   Margins Undefined edges 06/18/23 0855    Wound Thickness Description not for Pressure Injury Full thickness 06/18/23 0855   Number of days: 1       Wound 06/18/23 Leg Left Cluster (Active)   Wound Image     06/18/23 0855   Wound Etiology Venous 06/18/23 0855   Dressing Status Other (Comment) 06/18/23 0855   Wound Cleansed Soap and water 06/18/23 0855   Offloading for Diabetic Foot Ulcers Offloading not required 06/18/23 0855   Wound Length (cm) 17.2 cm 06/18/23 0855   Wound Width (cm) 8 cm 06/18/23 0855   Wound Depth (cm) 0.1 cm 06/18/23 0855   Wound Surface Area (cm^2) 137.6 cm^2 06/18/23 0855   Wound Volume (cm^3)  13.76 cm^3 06/18/23 0855   Wound Assessment Pink/red;Fluid filled blister;Ruptured blister 06/18/23 0855   Drainage Amount Large (50-75% saturated) 06/18/23 0855   Drainage Description Serosanguinous 06/18/23 0855   Odor None 06/18/23 0855   Peri-wound Assessment Fragile;Hemosiderin staining (brown yellow) 06/18/23 0855   Margins Undefined edges 06/18/23 0855   Wound Thickness Description not for Pressure Injury Partial thickness 06/18/23 0855   Number of days: 1       Wound 06/18/23 Leg Lower;Right;Lateral (Active)   Wound Image   06/18/23 0855   Wound Etiology Venous 06/18/23 0855   Dressing Status Other (Comment) 06/18/23 0855   Wound Cleansed Soap and water 06/18/23 0855   Offloading for Diabetic Foot Ulcers Offloading not required 06/18/23 0855   Wound Length (cm) 1.2 cm 06/18/23 0855   Wound Width (cm) 1.4 cm 06/18/23 0855   Wound Depth (cm) 0.1 cm 06/18/23 0855   Wound Surface Area (cm^2) 1.68 cm^2 06/18/23 0855   Wound Volume (cm^3) 0.168 cm^3 06/18/23 0855   Wound Assessment Pink/red;Slough 06/18/23 0855   Drainage Amount Copious (>75 % saturated) 06/18/23 0855   Drainage Description Serosanguinous 06/18/23 0855   Odor None 06/18/23 0855   Peri-wound Assessment Intact 06/18/23 0855   Margins Defined edges 06/18/23 0855   Wound Thickness Description not for Pressure Injury Full thickness 06/18/23 0855   Number of days: 1           Procedures done during this encounter:   Debridement: Selective Debridement/Non-Excisional Debridement  Indications:  Based on my examination of this patient's wound(s)/ulcer(s) today, debridement is required to promote healing and evaluate the wound base. Risks and benefits discussed with patient who has agreed to proceed.   Performed by: Ernestina Penna, MD  Consent obtained:  Yes  Time out taken:  Yes  Pain Control: Anesthetic  Anesthetic: 4% Lidocaine Liquid Topical   Using curette and guaze the wound(s)/ulcer(s) was/were debrided down through and including the removal of epidermis and dermis.      Devitalized Tissue Debrided:  slough  Pre Debridement Measurements:  Are located in the Wound/Ulcer Documentation Flow Sheet  Wound/Ulcer #:  numerous lesions scattered in the lower legs bilaterally137  Post Debridement Measurements:  Wound/Ulcer Descriptions are Pre Debridement except measurements:  Total Surface Area Debrided:  137 sq cm   Diabetic/Pressure/Non Pressure Ulcers only:  Ulcer: Non-Pressure ulcer, limited to breakdown of skin   Estimated Blood Loss:  None  Hemostasis Achieved:  not needed  Procedural Pain:  patient unable to quantitated  Post Procedural Pain:  patient unable to quantitated   Response to treatment:  Poorly tolerated by patient.     TIME: E/M Time spent with patient and/or patient care issues: []  15-20 min  []  21-30 min  []  31-44 min  []  45 min or more.   This is above the usual time needed to address patient's chief complaint today: []  Yes  []  No  This time includes physician non-face-to-face service time visit on the date of service such as  Preparing to see the patient (eg, review of tests)  Obtaining and/or reviewing separately obtained history  Performing a medically necessary appropriate examination and/or evaluation  Counseling and educating the patient/family/caregiver  Ordering medications, tests, or procedures  Referring and communicating with other health care professionals as  needed  Documenting clinical information in the electronic or other health record  Independently interpreting results (not reported separately) and communicating results to the patient/family/caregiver  Care coordination (not reported separately)  Objective:    BP (!) 142/59   Pulse 85   Temp 97.7 F (36.5 C) (Temporal)   Resp 18   Ht 1.626 m (5\' 4" )   Wt 99.8 kg (220 lb)   BMI 37.76 kg/m   Wt Readings from Last 3 Encounters:   06/18/23 99.8 kg (220 lb)   06/09/23 99.9 kg (220 lb 4.8 oz)   03/30/23 93.9 kg (207 lb)       PHYSICAL EXAM  General: Alert and in no acute distress. Obese. Oriented to place and purpose for being here.  Skin: Warm and dry, no rash  Head: Normocephalic and atraumatic  Eyes: Extraocular eye movements intact, conjunctivae normal, and sclera anicteric  ENT: Hearing grossly normal bilaterally. Normal appearance  Respiratory:  no respiratory distress  Musculoskeletal: Baseline range of motion in joints. Nontender calves. No cyanosis. Edema 2+.  Bilateral lower leg venous ulcers - recurrence.  See images and descriptions.   Neurologic: Speech normal. At baseline without new focal deficits. Mental status unchanged with delusion and mild dementia.  At her baseline.  Cooperative.       PAST MEDICAL HISTORY        Diagnosis Date    Arthritis     Asthma     Chronic renal disease, stage III (HCC) [630160] 10/13/2022    Depression     Diabetes (HCC)     Fibromyalgia     Fibromyalgia     Gastroparesis     Hyperlipemia     Hypertension     Neuropathy     Psychotic disorder (HCC)     Stroke (HCC)        PAST SURGICAL HISTORY    Past Surgical History:   Procedure Laterality Date    COLONOSCOPY      HYSTERECTOMY (CERVIX STATUS UNKNOWN)         FAMILY HISTORY    Family History   Problem Relation Age of Onset    Breast Cancer Maternal Grandmother     Cancer Father         lung ca    Hypertension Father     Diabetes Mother     Hypertension Mother        SOCIAL HISTORY    Social History     Tobacco Use     Smoking status: Never     Passive exposure: Never    Smokeless tobacco: Never   Vaping Use    Vaping Use: Never used   Substance Use Topics    Alcohol use: No    Drug use: No       ALLERGIES    Allergies   Allergen Reactions    Iodinated Contrast Media Shortness Of Breath    Statins Myalgia     Lovastatin, pravastatin, simvastatin, atorvastatin, zetia, livalo, lovaza    Adhesive Tape Rash    Ampicillin Rash    Gabapentin Nausea And Vomiting    Lidocaine Rash       MEDICATIONS    Current Outpatient Medications on File Prior to Encounter   Medication Sig Dispense Refill    lisinopril (PRINIVIL;ZESTRIL) 40 MG tablet Take 1 tablet by mouth daily (Patient not taking: Reported on 06/18/2023)      pregabalin (LYRICA) 300 MG capsule Take 1 capsule by mouth 2 times daily for 30 days. Max Daily Amount: 600 mg 60 capsule 0    quinapril (ACCUPRIL) 40 MG tablet Take 1 tablet by mouth daily (Patient not taking: Reported on 04/20/2023)  0    torsemide (DEMADEX) 20 MG tablet Take 5 tablets by mouth daily      linaclotide (LINZESS) 290 MCG CAPS capsule Take 1 capsule by mouth every morning (before breakfast) 30 capsule     beclomethasone (QVAR REDIHALER) 40 MCG/ACT AERB inhaler Inhale 1 puff into the lungs in the morning and 1 puff in the evening.      amLODIPine (NORVASC) 5 MG tablet Take 1 tablet by mouth daily (Patient not taking: Reported on 04/20/2023)  0    PARoxetine (PAXIL) 10 MG tablet Take 1 tablet by mouth daily (Patient not taking: Reported on 06/18/2023)  0    omeprazole (PRILOSEC) 40 MG delayed release capsule Take 1 capsule by mouth every morning (before breakfast)  0    albuterol-ipratropium (COMBIVENT RESPIMAT) 20-100 MCG/ACT AERS inhaler Inhale 1 puff into the lungs every 6 hours      insulin glargine (LANTUS) 100 UNIT/ML injection vial Inject 14 Units into the skin nightly (Patient not taking: Reported on 06/18/2023) 10 mL 3    insulin lispro (HUMALOG) 100 UNIT/ML SOLN injection vial Inject 0-4 Units into the skin 4  times daily (before meals and nightly) (Patient not taking: Reported on 06/18/2023)      linezolid (ZYVOX) 600 MG tablet Take 1 tablet by mouth 2 times daily Bid FOR 14 DAYS (Patient not taking: Reported on 03/05/2023)      pregabalin (LYRICA) 300 MG capsule Take 1 capsule by mouth 2 times daily for 180 days. Max Daily Amount: 600 mg 180 capsule 1    arformoterol tartrate 15 MCG/2ML NEBU 15 mcg, budesonide 0.25 MG/2ML SUSP 250 mcg Take 1 Dose by nebulization in the morning and 1 Dose in the evening. (Patient not taking: Reported on 12/18/2022) 60 Units 11    albuterol sulfate HFA (VENTOLIN HFA) 108 (90 Base) MCG/ACT inhaler Inhale 2 puffs into the lungs 4 times daily as needed for Wheezing (Patient not taking: Reported on 12/18/2022) 54 g 1    albuterol (PROVENTIL) (2.5 MG/3ML) 0.083% nebulizer solution Take 3 mLs by nebulization 4 times daily (Patient not taking: Reported on 04/20/2023) 120 each 3    QUEtiapine (SEROQUEL) 25 MG tablet Take 0.5 tablets by mouth nightly (Patient not taking: Reported on 02/16/2023) 60 tablet 3    arformoterol tartrate (BROVANA) 15 MCG/2ML NEBU Take 1 ampule by nebulization 2 times daily (Patient not taking: Reported on 12/18/2022) 120 mL 3    budesonide (PULMICORT) 0.25 MG/2ML nebulizer suspension Take 2 mLs by nebulization 2 times daily (Patient not taking: Reported on 12/18/2022) 60 each 3    albuterol sulfate HFA (VENTOLIN HFA) 108 (90 Base) MCG/ACT inhaler Inhale 2 puffs into the lungs 4 times daily as needed for Wheezing (Patient not taking: Reported on 04/20/2023) 18 g 0    carvedilol (COREG) 12.5 MG tablet Take 1 tablet by mouth 2 times daily      Multiple Vitamins-Minerals (THERAPEUTIC MULTIVITAMIN-MINERALS) tablet Take 1 tablet by mouth daily      aspirin 81 MG chewable tablet Take 1 tablet by mouth daily 30 tablet 11    vitamin D3 (CHOLECALCIFEROL) 125 MCG (5000 UT) TABS tablet Take 1 tablet by mouth daily 30 tablet 11    SITagliptin (JANUVIA) 100 MG tablet Take 1 tablet by mouth  daily 90 tablet 3    quinapril (ACCUPRIL) 40 MG tablet quinapril 40 mg tablet   TAKE 1 TABLET BY MOUTH EVERY NIGHT FOR HIGH BLOOD PRESSURE (Patient not taking: Reported on 12/19/2022) 90 tablet 3  REPATHA SURECLICK 140 MG/ML SOAJ INJECT AS DIRECTED EVERY 2 WEEKS 2 Adjustable Dose Pre-filled Pen Syringe 11    insulin lispro, 1 Unit Dial, (HUMALOG/ADMELOG) 100 UNIT/ML SOPN Humalog KwikPen (U-100) Insulin 100 unit/mL subcutaneous   INJECT UP TO 50 UNITS UNDER THE SKIN DAILY AS DIRECTED (Patient not taking: Reported on 04/20/2023)      ipratropium (ATROVENT HFA) 17 MCG/ACT inhaler Atrovent HFA 17 mcg/actuation aerosol inhaler (Patient not taking: Reported on 02/16/2023)      linaclotide (LINZESS) 290 MCG CAPS capsule Take 1 capsule by mouth as needed (Patient not taking: Reported on 06/09/2023)      olopatadine (PATANOL) 0.1 % ophthalmic solution Apply 2 drops to eye 2 times daily (Patient not taking: Reported on 04/20/2023)       No current facility-administered medications on file prior to encounter.         Written patient dismissal instructions given to patient and signed by patient or POA.         Electronically signed by Ernestina Penna, MD on 06/19/2023 at 10:15 AM

## 2023-06-18 NOTE — Patient Instructions (Addendum)
Discharge Instructions for  [facility]  [address]  [place], [place] [address]  Phone: [phone] Fax: [phone]    NAME:  Jamie Bryant  DATE OF BIRTH:  05-May-1941  MEDICAL RECORD NUMBER:  [id number]  DATE:  June 18, 2023    WOUND CARE ORDERS:  Cleanse with baby shampoo. Rinse and pat dry. Apply santyl to wounds. Cover with hydrofera blue then superabsorbent dressing. Cover with roll gauze. Secure with tape. Apply double layer tubigrip size "F". Home health to change dressing every other day.    Please bring your tube of santyl so we can see the size you have at home.    Activity:  [x]  Elevate leg(s) above the level of the heart when sitting.  [x]  Avoid prolonged standing in one place.   [x]  Do no get dressing/wrap wet.       Dietary:  []  Diet as tolerated      [x]  Diabetic Diet            []  Increase Protein: examples (Meat, cheese, eggs, greek yogurt, fish, nuts)          []  Juven Therapeutic Nutrition Powder  []  Other:       Return Appointment:  [x]  Return Appointment: With Dr. Jacklynn Bue in 1 Week  []  Nurse Visit :   []  Ordered tests:      Electronically signed Loretha Brasil, RN on 06/18/2023 at 8:46 AM     Wound Care Center Information: Should you experience any significant changes in your wound(s) or have questions about your wound care, please contact the [facility] at [facility] - FRIDAY 8:00 am - 4:30.  If you need help with your wound outside these hours and cannot wait until we are again available, contact your PCP or go to the hospital emergency room.     PLEASE NOTE: IF YOU ARE UNABLE TO OBTAIN WOUND SUPPLIES, CONTINUE TO USE THE SUPPLIES YOU HAVE AVAILABLE UNTIL YOU ARE ABLE TO REACH [place]. IT IS MOST IMPORTANT TO KEEP THE WOUND COVERED AT ALL TIMES.     Physician Signature:_______________________    Date: ___________ Time:  ____________

## 2023-06-18 NOTE — Progress Notes (Signed)
[facility]  [address]  [address]  [place] [address]  Phone:  [phone]  Fax: [phone]       Chief Complaint   Patient presents with    Follow-Up from Hospital     Wound care both legs   .      SUBJECTIVE:    Jamie Bryant is a 82 y.o. female  Dictation on: 06/18/2023  2:44 PM by: Vianne Bulls [phone]          Current Outpatient Medications   Medication Sig Dispense Refill    lisinopril (PRINIVIL;ZESTRIL) 40 MG tablet Take 1 tablet by mouth daily (Patient not taking: Reported on 06/18/2023)      torsemide (DEMADEX) 20 MG tablet Take 5 tablets by mouth daily      linaclotide (LINZESS) 290 MCG CAPS capsule Take 1 capsule by mouth every morning (before breakfast) 30 capsule     beclomethasone (QVAR REDIHALER) 40 MCG/ACT AERB inhaler Inhale 1 puff into the lungs in the morning and 1 puff in the evening.      PARoxetine (PAXIL) 10 MG tablet Take 1 tablet by mouth daily (Patient not taking: Reported on 06/18/2023)  0    omeprazole (PRILOSEC) 40 MG delayed release capsule Take 1 capsule by mouth every morning (before breakfast)  0    albuterol-ipratropium (COMBIVENT RESPIMAT) 20-100 MCG/ACT AERS inhaler Inhale 1 puff into the lungs every 6 hours      insulin glargine (LANTUS) 100 UNIT/ML injection vial Inject 14 Units into the skin nightly (Patient not taking: Reported on 06/18/2023) 10 mL 3    pregabalin (LYRICA) 300 MG capsule Take 1 capsule by mouth 2 times daily for 180 days. Max Daily Amount: 600 mg 180 capsule 1    carvedilol (COREG) 12.5 MG tablet Take 1 tablet by mouth 2 times daily      Multiple Vitamins-Minerals (THERAPEUTIC MULTIVITAMIN-MINERALS) tablet Take 1 tablet by mouth daily      aspirin 81 MG chewable tablet Take 1 tablet by mouth daily 30 tablet 11    vitamin D3 (CHOLECALCIFEROL) 125 MCG (5000 UT) TABS tablet Take 1 tablet by mouth daily 30 tablet 11    SITagliptin (JANUVIA) 100 MG tablet Take 1 tablet by mouth daily 90 tablet 3    REPATHA SURECLICK 140  MG/ML SOAJ INJECT AS DIRECTED EVERY 2 WEEKS 2 Adjustable Dose Pre-filled Pen Syringe 11    pregabalin (LYRICA) 300 MG capsule Take 1 capsule by mouth 2 times daily for 30 days. Max Daily Amount: 600 mg 60 capsule 0    quinapril (ACCUPRIL) 40 MG tablet Take 1 tablet by mouth daily (Patient not taking: Reported on 04/20/2023)  0    amLODIPine (NORVASC) 5 MG tablet Take 1 tablet by mouth daily (Patient not taking: Reported on 04/20/2023)  0    insulin lispro (HUMALOG) 100 UNIT/ML SOLN injection vial Inject 0-4 Units into the skin 4 times daily (before meals and nightly) (Patient not taking: Reported on 06/18/2023)      linezolid (ZYVOX) 600 MG tablet Take 1 tablet by mouth 2 times daily Bid FOR 14 DAYS (Patient not taking: Reported on 03/05/2023)      arformoterol tartrate 15 MCG/2ML NEBU 15 mcg, budesonide 0.25 MG/2ML SUSP 250 mcg Take 1 Dose by nebulization in the morning and 1 Dose in the evening. (Patient not taking: Reported on 12/18/2022) 60 Units 11    albuterol sulfate HFA (VENTOLIN HFA) 108 (90 Base) MCG/ACT inhaler  Inhale 2 puffs into the lungs 4 times daily as needed for Wheezing (Patient not taking: Reported on 12/18/2022) 54 g 1    albuterol (PROVENTIL) (2.5 MG/3ML) 0.083% nebulizer solution Take 3 mLs by nebulization 4 times daily (Patient not taking: Reported on 04/20/2023) 120 each 3    QUEtiapine (SEROQUEL) 25 MG tablet Take 0.5 tablets by mouth nightly (Patient not taking: Reported on 02/16/2023) 60 tablet 3    arformoterol tartrate (BROVANA) 15 MCG/2ML NEBU Take 1 ampule by nebulization 2 times daily (Patient not taking: Reported on 12/18/2022) 120 mL 3    budesonide (PULMICORT) 0.25 MG/2ML nebulizer suspension Take 2 mLs by nebulization 2 times daily (Patient not taking: Reported on 12/18/2022) 60 each 3    albuterol sulfate HFA (VENTOLIN HFA) 108 (90 Base) MCG/ACT inhaler Inhale 2 puffs into the lungs 4 times daily as needed for Wheezing (Patient not taking: Reported on 04/20/2023) 18 g 0    quinapril  (ACCUPRIL) 40 MG tablet quinapril 40 mg tablet   TAKE 1 TABLET BY MOUTH EVERY NIGHT FOR HIGH BLOOD PRESSURE (Patient not taking: Reported on 12/19/2022) 90 tablet 3    insulin lispro, 1 Unit Dial, (HUMALOG/ADMELOG) 100 UNIT/ML SOPN Humalog KwikPen (U-100) Insulin 100 unit/mL subcutaneous   INJECT UP TO 50 UNITS UNDER THE SKIN DAILY AS DIRECTED (Patient not taking: Reported on 04/20/2023)      ipratropium (ATROVENT HFA) 17 MCG/ACT inhaler Atrovent HFA 17 mcg/actuation aerosol inhaler (Patient not taking: Reported on 02/16/2023)      linaclotide (LINZESS) 290 MCG CAPS capsule Take 1 capsule by mouth as needed (Patient not taking: Reported on 06/09/2023)      olopatadine (PATANOL) 0.1 % ophthalmic solution Apply 2 drops to eye 2 times daily (Patient not taking: Reported on 04/20/2023)       No current facility-administered medications for this visit.     Past Medical History:   Diagnosis Date    Arthritis     Asthma     Chronic renal disease, stage III [facility]) [[id number]] 10/13/2022    Depression     Diabetes (HCC)     Fibromyalgia     Fibromyalgia     Gastroparesis     Hyperlipemia     Hypertension     Neuropathy     Psychotic disorder (HCC)     Stroke [facility])      Past Surgical History:   Procedure Laterality Date    COLONOSCOPY      HYSTERECTOMY (CERVIX STATUS UNKNOWN)       Allergies   Allergen Reactions    Iodinated Contrast Media Shortness Of Breath    Statins Myalgia     Lovastatin, pravastatin, simvastatin, atorvastatin, zetia, livalo, lovaza    Adhesive Tape Rash    Ampicillin Rash    Gabapentin Nausea And Vomiting    Lidocaine Rash         REVIEW OF SYSTEMS:  General: negative for - chills or fever  ENT: negative for - headaches, nasal congestion or tinnitus  Respiratory: negative for - cough, hemoptysis, shortness of breath or wheezing  Cardiovascular : negative for - chest pain, edema, palpitations or shortness of breath  Gastrointestinal: negative for - abdominal pain, blood in stools, heartburn or  nausea/vomiting  Genito-Urinary: no dysuria, trouble voiding, or hematuria  Musculoskeletal: negative for - gait disturbance, joint pain, joint stiffness or joint swelling  Neurological: no TIA or stroke symptoms  Hematologic: no bruises, no bleeding, no swollen glands  Integument: no lumps, mole changes,  nail changes or rash  Endocrine: no malaise/lethargy or unexpected weight changes      Social History     Socioeconomic History    Marital status: Divorced     Spouse name: None    Number of children: 2    Years of education: None    Highest education level: Bachelor's degree (e.g., BA, AB, BS)   Tobacco Use    Smoking status: Never     Passive exposure: Never    Smokeless tobacco: Never   Vaping Use    Vaping Use: Never used   Substance and Sexual Activity    Alcohol use: No    Drug use: No    Sexual activity: Not Currently     Comment: divorced,2 children,retired,living in [facility]   Social History Narrative    Nothing in the past 5 years     Social Determinants of Health     Financial Resource Strain: Low Risk  (06/09/2023)    Overall Financial Resource Strain (CARDIA)     Difficulty of Paying Living Expenses: Not hard at all   Food Insecurity: No Food Insecurity (06/09/2023)    Hunger Vital Sign     Worried About Running Out of Food in the Last Year: Never true     Ran Out of Food in the Last Year: Never true   Transportation Needs: No Transportation Needs (06/09/2023)    PRAPARE - Therapist, art (Medical): No     Lack of Transportation (Non-Medical): No   Physical Activity: Inactive (05/26/2023)    Exercise Vital Sign     Days of Exercise per Week: 0 days     Minutes of Exercise per Session: 0 min   Stress: No Stress Concern Present (05/26/2023)    [employer] of Occupational Health - Occupational Stress Questionnaire     Feeling of Stress : Only a little   Social Connections: Socially Isolated (05/26/2023)    Social Connection and Isolation Panel [NHANES]      Frequency of Communication with Friends and Family: Once a week     Frequency of Social Gatherings with Friends and Family: Once a week     Attends Religious Services: Never     Database administrator or Organizations: No     Attends Banker Meetings: Never     Marital Status: Divorced   Catering manager Violence: Not At Risk (05/26/2023)    Humiliation, Afraid, Rape, and Kick questionnaire     Fear of Current or Ex-Partner: No     Emotionally Abused: No     Physically Abused: No     Sexually Abused: No   Housing Stability: Low Risk  (06/09/2023)    Housing Stability Vital Sign     Unable to Pay for Housing in the Last Year: No     Number of Places Lived in the Last Year: 1     Unstable Housing in the Last Year: No     Family History   Problem Relation Age of Onset    Breast Cancer Maternal Grandmother     Cancer Father         lung ca    Hypertension Father     Diabetes Mother     Hypertension Mother        OBJECTIVE:    BP 127/69 (Site: Left Upper Arm, Position: Sitting, Cuff Size: Large Adult)   Pulse 74   Temp 98.3 F (36.8  C) (Oral)   Resp 16   Ht 1.626 m (5\' 4" )   Wt 99.9 kg (220 lb 4.8 oz)   SpO2 97%   BMI 37.81 kg/m   CONSTITUTIONAL: well , well nourished, appears age appropriate  EYES: perrla, eom intact  ENMT:moist mucous membranes, pharynx clear  NECK: supple. Thyroid normal  RESPIRATORY: Chest: clear to ascultation and percussion   CARDIOVASCULAR: Heart: regular rate and rhythm  GASTROINTESTINAL: Abdomen: soft, bowel sounds active  HEMATOLOGIC: no pathological lymph nodes palpated  MUSCULOSKELETAL: Extremities: 1+ edema distally, pulse 1+   INTEGUMENT: No unusual rashes or suspicious skin lesions noted. Nails appear normal.  NEUROLOGIC: non-focal exam   MENTAL STATUS: alert and oriented, appropriate affect      ASSESSMENT:  1. Venous stasis ulcer of left calf with fat layer exposed without varicose veins (HCC)    2. Chronic heart failure with preserved ejection fraction (HCC)    3.  Severe persistent asthma with acute exacerbation    4. Vascular parkinsonism (HCC)    5. Delusion (HCC)    6. Dementia without behavioral disturbance (HCC)    7. Type 2 diabetes mellitus with diabetic neuropathy, without long-term current use of insulin (HCC)    8. Stage 3a chronic kidney disease (HCC)    9. Dyslipidemia        PLAN:   Dictation on: 06/18/2023  2:47 PM by: Arbutus Ped A [phone]     Orders Placed This Encounter   Procedures    COLLECTION VENOUS BLOOD,VENIPUNCTURE    Hemoglobin A1C     Standing Status:   Future     Number of Occurrences:   1     Standing Expiration Date:   06/08/2024    CBC with Auto Differential     Standing Status:   Future     Number of Occurrences:   1     Standing Expiration Date:   06/08/2024    Comprehensive Metabolic Panel     Standing Status:   Future     Number of Occurrences:   1     Standing Expiration Date:   06/08/2024    Lipid Panel     Standing Status:   Future     Number of Occurrences:   1     Standing Expiration Date:   06/08/2024        Follow-up and Dispositions    Return in about 4 weeks (around 07/07/2023).             Arbutus Ped, MD

## 2023-06-25 ENCOUNTER — Inpatient Hospital Stay
Admit: 2023-06-25 | Discharge: 2023-06-25 | Payer: Medicare Other | Attending: Radiation Oncology | Primary: Internal Medicine

## 2023-06-25 DIAGNOSIS — I872 Venous insufficiency (chronic) (peripheral): Secondary | ICD-10-CM

## 2023-06-25 DIAGNOSIS — L251 Unspecified contact dermatitis due to drugs in contact with skin: Secondary | ICD-10-CM

## 2023-06-25 MED ORDER — HYDROCORTISONE ACETATE 1 % EX CREA
1 | Freq: Once | CUTANEOUS | Status: AC
Start: 2023-06-25 — End: 2023-06-25
  Administered 2023-06-25: 14:00:00 1 via TOPICAL

## 2023-06-25 NOTE — Other (Signed)
06/25/23 1011   Right Leg Edema Point of Measurement   Compression Therapy 2 layer compression wrap   Left Leg Edema Point of Measurement   Compression Therapy 2 layer compression wrap   Wound 06/18/23 Leg Left Circumferential   Date First Assessed/Time First Assessed: 06/18/23 0902   Present on Original Admission: Yes  Wound Approximate Age at First Assessment (Weeks): 4 weeks  Primary Wound Type: Venous Ulcer  Location: Leg  Wound Location Orientation: Left  Wound Descripti...   Dressing Status New dressing applied   Dressing/Treatment Non adherent;ABD;Roll gauze   Wound 06/18/23 Leg Right Circumferential   Date First Assessed/Time First Assessed: 06/18/23 0900   Present on Original Admission: Yes  Wound Approximate Age at First Assessment (Weeks): 4 weeks  Primary Wound Type: Venous Ulcer  Location: Leg  Wound Location Orientation: Right  Wound Descript...   Dressing Status New dressing applied   Dressing/Treatment Non adherent;ABD;Roll gauze

## 2023-06-25 NOTE — Progress Notes (Incomplete)
[facility]   Wound Care and Hyperbaric Oxygen Therapy Center   Medical Staff Progress Note     Jamie Bryant  MEDICAL RECORD NUMBER:  [id number]  AGE: 82 y.o.   GENDER: female  DOB: 03-Aug-1941  EPISODE DATE:  06/25/2023    Chief complaint and reason for visit:     Chief Complaint   Patient presents for evaluation of her recurrent bilateral lower leg venous ulcers     Follow-up     "Wounds on both legs"      Patient presenting for follow up evaluation of wound(s) per chief complaint.      Subjective and ROS:  [name],  a 82 yo female with history of DM 2; HTN, CVA CHF, Parkinson's, delusion, chronic asthma, alleged dementia, CRF and chronic venous insufficiency of the lower legs, returns to the [facility] at the request of her PCP, Dr Jamie Bryant,  for evaluation of her recurrent bilateral lower leg venous ulcers.   Patient was seen back in March, 2024 for wound care to her lower legs with venous ulcers. She failed to return for follow up after 3 visits and wounds incompletely healed.       Most recently according to the patient she was hospitalized at a [facility] hospital for CHF and then transferred to a nursing facility ([facility]).   After discharge or during her medical care, she developed new bilateral lower leg venous ulcers spontaneously. She complains of pain but cannot quantitated; has heavy drainage without odor. Has not use any compression stockings of late.  Claims she has some Santyl at home that was ordered earlier this year but never used.       Has diabetes and unclear about her A1c.  Chart shows A1C of 6.3 on 06/09/23.  Denies smoking.  Patient states she is allergic to Lidocaine and decline to have it used today.    June 25, 2023-  On follow up, [name] feels worst.  Complains of the itch and pain in both the lower leg        History of Wound Context: Per original history and physical on this patient. Changes in history since last evaluation: none    Medical Decision Making:     Problem  List Items Addressed This Visit          Other    Dermatitis due to topical drug - Primary       Wounds and Treatment Plan:      Other associated diagnoses or problems addressed:  ***    Pertinent imaging reviewed including independent interpretation include:  None    Pertinent labs reviewed.   Medical records and review of external note (s) from other providers done as well.    New lab or imaging orders placed:  None     Prescription drug management: {SH N/A :26378}     Discussion of management or test interpretation with {Discussion management:35297}.    Comorbid conditions affecting wound healing: As per PMH which was reviewed.    Risk of complications and/or mortality of patient management:  This patient has a {Risk:35298} risk of morbidity and mortality from additional diagnostic testing or treatment. This is due to the above conditions affecting wound healing as well as patient and procedure risk factors. Education and discussion held with patient regarding these disease processes pertinent to wound(s).  Other pertinent decisions include: {Other pertinent decisions:35299}.  The patient's diagnosis or treatment {IS/IS NOT:19932} significantly limited by social determinants of  health as noted by: {Limited social determinants:35300}.    Wound 06/18/23 Leg Right Circumferential (Active)   Wound Image    06/25/23 0905   Wound Etiology Venous 06/25/23 0905   Dressing Status New dressing applied 06/25/23 1011   Wound Cleansed Soap and water 06/25/23 0905   Dressing/Treatment Non adherent;ABD;Roll gauze 06/25/23 1011   Offloading for Diabetic Foot Ulcers Offloading not required 06/25/23 0905   Wound Length (cm) 33 cm 06/25/23 0905   Wound Width (cm) 20 cm 06/25/23 0905   Wound Depth (cm) 0.2 cm 06/25/23 0905   Wound Surface Area (cm^2) 660 cm^2 06/25/23 0905   Change in Wound Size % (l*w) -70.32 06/25/23 0905   Wound Volume (cm^3) 132 cm^3 06/25/23 0905   Wound Healing % -70 06/25/23 0905   Wound Assessment  Slough;Pink/red 06/25/23 0905   Drainage Amount Copious (>75 % saturated) 06/25/23 0905   Drainage Description Serous 06/25/23 0905   Odor None 06/25/23 0905   Peri-wound Assessment Hemosiderin staining (brown yellow);Hyperpigmented 06/25/23 0905   Margins Undefined edges 06/25/23 0905   Wound Thickness Description not for Pressure Injury Full thickness 06/25/23 0905   Number of days: 7       Wound 06/18/23 Leg Left Circumferential (Active)   Wound Image     06/25/23 0905   Wound Etiology Venous 06/25/23 0905   Dressing Status New dressing applied 06/25/23 1011   Wound Cleansed Soap and water 06/25/23 0905   Dressing/Treatment Non adherent;ABD;Roll gauze 06/25/23 1011   Offloading for Diabetic Foot Ulcers Offloading not required 06/25/23 0905   Wound Length (cm) 17 cm 06/25/23 0905   Wound Width (cm) 39 cm 06/25/23 0905   Wound Depth (cm) 0.3 cm 06/25/23 0905   Wound Surface Area (cm^2) 663 cm^2 06/25/23 0905   Change in Wound Size % (l*w) -381.83 06/25/23 0905   Wound Volume (cm^3) 198.9 cm^3 06/25/23 0905   Wound Healing % -1345 06/25/23 0905   Wound Assessment Pink/red;Fluid filled blister;Slough 06/25/23 0905   Drainage Amount Copious (>75 % saturated) 06/25/23 0905   Drainage Description Serous 06/25/23 0905   Odor None 06/25/23 0905   Peri-wound Assessment Hemosiderin staining (brown yellow) 06/25/23 0905   Margins Undefined edges 06/25/23 0905   Wound Thickness Description not for Pressure Injury Full thickness 06/25/23 0905   Number of days: 7          Procedures done during this encounter:   Debridement: {Debridement :23294}  Indications:  Based on my examination of this patient's wound(s)/ulcer(s) today, debridement {IS/IS ZOX:0960454098} required to promote healing and evaluate the wound base. Risks and benefits discussed with patient who has agreed to proceed.   Performed by: Ernestina Penna, MD  Consent obtained:  {yes JX:914782}  Time out taken:  {yes no:315493}  Pain Control:     Using {DEBRIDEMENT  INSTRUMENTS:21376} the wound(s)/ulcer(s) was/were debrided down through and including the removal of {WOUND CARE DEBRIDEMENT:304201380}.      Devitalized Tissue Debrided:  {DEVITALIZED TISSUE DEBRIDED:304201389}  Pre Debridement Measurements:  Are located in the Wound/Ulcer Documentation Flow Sheet  Wound/Ulcer #: { WOUNDS NUMBERS:[id number]}  Post Debridement Measurements:  Wound/Ulcer Descriptions are Pre Debridement except measurements:  Total Surface Area Debrided:  *** sq cm   Diabetic/Pressure/Non Pressure Ulcers only:  Ulcer: {Diabetic/Pressure/Non Pressure Ulcers only:23295}   Estimated Blood Loss:  {ESTIMATED BLOOD NFAO:130865784}  Hemostasis Achieved:  {CONTROLLED ONGE:952841324}  Procedural Pain:  {NUMBERS 0-10:20300}  / 10   Post Procedural Pain:  {NUMBERS 0-10:20300} / 10  Response to treatment:  {HH TOLERATED:109078}     TIME: E/M Time spent with patient and/or patient care issues: []  15-20 min  []  21-30 min  []  31-44 min  []  45 min or more.   This is above the usual time needed to address patient's chief complaint today: []  Yes  []  No  This time includes physician non-face-to-face service time visit on the date of service such as  Preparing to see the patient (eg, review of tests)  Obtaining and/or reviewing separately obtained history  Performing a medically necessary appropriate examination and/or evaluation  Counseling and educating the patient/family/caregiver  Ordering medications, tests, or procedures  Referring and communicating with other health care professionals as needed  Documenting clinical information in the electronic or other health record  Independently interpreting results (not reported separately) and communicating results to the patient/family/caregiver  Care coordination (not reported separately)    Objective:    BP (!) 156/74   Pulse 88   Temp 97.8 F (36.6 C) (Temporal)   Resp 20   Wt Readings from Last 3 Encounters:   06/18/23 99.8 kg (220 lb)   06/09/23 99.9 kg (220 lb 4.8  oz)   03/30/23 93.9 kg (207 lb)       PHYSICAL EXAM  General: Alert and in no acute distress. Normal appearing  Skin: Warm and dry, no rash  Head: Normocephalic and atraumatic  Eyes: Extraocular eye movements intact, conjunctivae normal, and sclera anicteric  ENT: Hearing grossly normal bilaterally. Normal appearance  Respiratory: no chest wall tenderness. no respiratory distress  GI: Abdomen non-tender and benign  Musculoskeletal: Baseline range of motion in joints. Nontender calves. No cyanosis. Edema {Numbers; 1-4+ (neg and trace):16140}.  Neurologic: Speech normal. At baseline without new focal deficits. Mental status normal or at baseline    PAST MEDICAL HISTORY        Diagnosis Date    Arthritis     Asthma     Chronic renal disease, stage III [facility]) [096045] 10/13/2022    Depression     Diabetes (HCC)     Fibromyalgia     Fibromyalgia     Gastroparesis     Hyperlipemia     Hypertension     Neuropathy     Psychotic disorder (HCC)     Stroke (HCC)        PAST SURGICAL HISTORY    Past Surgical History:   Procedure Laterality Date    COLONOSCOPY      HYSTERECTOMY (CERVIX STATUS UNKNOWN)         FAMILY HISTORY    Family History   Problem Relation Age of Onset    Breast Cancer Maternal Grandmother     Cancer Father         lung ca    Hypertension Father     Diabetes Mother     Hypertension Mother        SOCIAL HISTORY    Social History     Tobacco Use    Smoking status: Never     Passive exposure: Never    Smokeless tobacco: Never   Vaping Use    Vaping Use: Never used   Substance Use Topics    Alcohol use: No    Drug use: No       ALLERGIES    Allergies   Allergen Reactions    Iodinated Contrast Media Shortness Of Breath    Statins Myalgia     Lovastatin, pravastatin, simvastatin, atorvastatin, zetia, livalo, lovaza  Adhesive Tape Rash    Ampicillin Rash    Gabapentin Nausea And Vomiting    Lidocaine Rash       MEDICATIONS    Current Outpatient Medications on File Prior to Encounter   Medication Sig Dispense  Refill    lisinopril (PRINIVIL;ZESTRIL) 40 MG tablet Take 1 tablet by mouth daily (Patient not taking: Reported on 06/18/2023)      pregabalin (LYRICA) 300 MG capsule Take 1 capsule by mouth 2 times daily for 30 days. Max Daily Amount: 600 mg 60 capsule 0    quinapril (ACCUPRIL) 40 MG tablet Take 1 tablet by mouth daily (Patient not taking: Reported on 04/20/2023)  0    torsemide (DEMADEX) 20 MG tablet Take 5 tablets by mouth daily      linaclotide (LINZESS) 290 MCG CAPS capsule Take 1 capsule by mouth every morning (before breakfast) 30 capsule     beclomethasone (QVAR REDIHALER) 40 MCG/ACT AERB inhaler Inhale 1 puff into the lungs in the morning and 1 puff in the evening.      amLODIPine (NORVASC) 5 MG tablet Take 1 tablet by mouth daily (Patient not taking: Reported on 04/20/2023)  0    PARoxetine (PAXIL) 10 MG tablet Take 1 tablet by mouth daily (Patient not taking: Reported on 06/18/2023)  0    omeprazole (PRILOSEC) 40 MG delayed release capsule Take 1 capsule by mouth every morning (before breakfast)  0    albuterol-ipratropium (COMBIVENT RESPIMAT) 20-100 MCG/ACT AERS inhaler Inhale 1 puff into the lungs every 6 hours      insulin glargine (LANTUS) 100 UNIT/ML injection vial Inject 14 Units into the skin nightly (Patient not taking: Reported on 06/18/2023) 10 mL 3    insulin lispro (HUMALOG) 100 UNIT/ML SOLN injection vial Inject 0-4 Units into the skin 4 times daily (before meals and nightly) (Patient not taking: Reported on 06/18/2023)      linezolid (ZYVOX) 600 MG tablet Take 1 tablet by mouth 2 times daily Bid FOR 14 DAYS (Patient not taking: Reported on 03/05/2023)      pregabalin (LYRICA) 300 MG capsule Take 1 capsule by mouth 2 times daily for 180 days. Max Daily Amount: 600 mg 180 capsule 1    arformoterol tartrate 15 MCG/2ML NEBU 15 mcg, budesonide 0.25 MG/2ML SUSP 250 mcg Take 1 Dose by nebulization in the morning and 1 Dose in the evening. (Patient not taking: Reported on 12/18/2022) 60 Units 11     albuterol sulfate HFA (VENTOLIN HFA) 108 (90 Base) MCG/ACT inhaler Inhale 2 puffs into the lungs 4 times daily as needed for Wheezing (Patient not taking: Reported on 12/18/2022) 54 g 1    albuterol (PROVENTIL) (2.5 MG/3ML) 0.083% nebulizer solution Take 3 mLs by nebulization 4 times daily (Patient not taking: Reported on 04/20/2023) 120 each 3    QUEtiapine (SEROQUEL) 25 MG tablet Take 0.5 tablets by mouth nightly (Patient not taking: Reported on 02/16/2023) 60 tablet 3    arformoterol tartrate (BROVANA) 15 MCG/2ML NEBU Take 1 ampule by nebulization 2 times daily (Patient not taking: Reported on 12/18/2022) 120 mL 3    budesonide (PULMICORT) 0.25 MG/2ML nebulizer suspension Take 2 mLs by nebulization 2 times daily (Patient not taking: Reported on 12/18/2022) 60 each 3    albuterol sulfate HFA (VENTOLIN HFA) 108 (90 Base) MCG/ACT inhaler Inhale 2 puffs into the lungs 4 times daily as needed for Wheezing (Patient not taking: Reported on 04/20/2023) 18 g 0    carvedilol (COREG) 12.5 MG tablet Take  1 tablet by mouth 2 times daily      Multiple Vitamins-Minerals (THERAPEUTIC MULTIVITAMIN-MINERALS) tablet Take 1 tablet by mouth daily      aspirin 81 MG chewable tablet Take 1 tablet by mouth daily 30 tablet 11    vitamin D3 (CHOLECALCIFEROL) 125 MCG (5000 UT) TABS tablet Take 1 tablet by mouth daily 30 tablet 11    SITagliptin (JANUVIA) 100 MG tablet Take 1 tablet by mouth daily 90 tablet 3    quinapril (ACCUPRIL) 40 MG tablet quinapril 40 mg tablet   TAKE 1 TABLET BY MOUTH EVERY NIGHT FOR HIGH BLOOD PRESSURE (Patient not taking: Reported on 12/19/2022) 90 tablet 3    REPATHA SURECLICK 140 MG/ML SOAJ INJECT AS DIRECTED EVERY 2 WEEKS 2 Adjustable Dose Pre-filled Pen Syringe 11    insulin lispro, 1 Unit Dial, (HUMALOG/ADMELOG) 100 UNIT/ML SOPN Humalog KwikPen (U-100) Insulin 100 unit/mL subcutaneous   INJECT UP TO 50 UNITS UNDER THE SKIN DAILY AS DIRECTED (Patient not taking: Reported on 04/20/2023)      ipratropium (ATROVENT  HFA) 17 MCG/ACT inhaler Atrovent HFA 17 mcg/actuation aerosol inhaler (Patient not taking: Reported on 02/16/2023)      linaclotide (LINZESS) 290 MCG CAPS capsule Take 1 capsule by mouth as needed (Patient not taking: Reported on 06/09/2023)      olopatadine (PATANOL) 0.1 % ophthalmic solution Apply 2 drops to eye 2 times daily (Patient not taking: Reported on 04/20/2023)       No current facility-administered medications on file prior to encounter.         Written patient dismissal instructions given to patient and signed by patient or POA.         Electronically signed by Ernestina Penna, MD on 06/25/2023 at 11:17 AM

## 2023-06-25 NOTE — Care Coordination-Inpatient (Signed)
Ambulatory Care Coordination Note     06/25/2023 12:01 PM     Patient Current Location:  [place]     ACM contacted the patient by telephone. Verified name and DOB with patient as identifiers.         ACM: Cecil Cranker, RN     Challenges to be reviewed by the provider   Additional needs identified to be addressed with provider Yes  PT-Pt reports a lot of pain in her legs making it difficult for her to stand. Pt reports she is trying to lose weight and gain strength to take stress off her legs as her wounds heal. ACM provided weight loss information and gave her phone number for [facility] dietician to call.  Pt would benefit from PT as she reports her physical condition is worsening.                Method of communication with provider: chart routing.    Care Summary Note: See above and goals for updates on this call.     Offered patient enrollment in the Remote Patient Monitoring (RPM) program for in-home monitoring: Yes, but did not enroll at this time: will discuss again with pt at next encounter .     Assessments Completed:   General Assessment    Do you have any symptoms that are causing concern?: Yes  Progression since Onset: Gradually Worsening  Reported Symptoms: Pain          Medications Reviewed:   Completed during a previous call     Advance Care Planning:   Reviewed and current     Care Planning:   Education Documentation  No documentation found.  Education Comments  No comments found.     ,    Goals Addressed                      This Visit's Progress      Conditions and Symptoms   On track      I will schedule office visits, as directed by my provider.  I will keep my appointment or reschedule if I have to cancel.  I will notify my provider of any barriers to my plan of care.  I will notify my provider of any symptoms that indicate a worsening of my condition.    Barriers: overwhelmed by complexity of regimen and lack of education  Plan for overcoming my barriers: ACM will provide support, education,  and coordination of resources that may contribute to improvement of patient's condition.   Confidence: 7/10  Anticipated Goal Completion Date: 08/26/23  06/25/23   Pt reports wound care is going well but slow to heal. She reports she has a lot of pain and she can barely walk because of the pain. She reports the wounds are draining a lot. Cousin provides transportion to apts. EA  05/26/23  Reviewed pt upcoming appts and pt reports she has transportation to these appts and is able to keep them. EA          Patient Stated (pt-stated)   On track      Pt is trying to lose weight to help reduce pain in her legs and increase strength.     Barriers: lack of support and lack of education  Plan for overcoming my barriers: ACM will provide support, education, and coordination of resources that may contribute to improvement of patient's condition.   Confidence: 8/10  Anticipated Goal Completion Date: 09/25/23  06/25/23  Discussed pts goals with and ACM provided nutrition education for pt. ACM will mail weight loss information to patient and provided dietician phone number for Alfredia Client, RD for pt to call [phone].  Will f/u on this at next encounter. EA               PCP/Specialist follow up:   Future Appointments         Provider Specialty Dept Phone    06/26/2023 11:45 AM [facility] WOUND CARE 1 Wound Ostomy [phone]    07/01/2023 11:30 AM [facility] WOUND CARE 1 Wound Ostomy [phone]    07/09/2023 9:30 AM Ernestina Penna, MD Wound Ostomy [phone]    07/10/2023 1:30 PM Vianne Bulls, MD Internal Medicine [phone]            Follow Up:   Plan for next ACM outreach in approximately 2 weeks to complete:  - goal progression  - education .   patient  is agreeable to this plan.

## 2023-06-25 NOTE — Patient Instructions (Addendum)
Discharge Instructions for  [facility]  [address]  [place], [place] [address]  Phone: [phone] Fax: [phone]    NAME:  Jamie Bryant  DATE OF BIRTH:  07-03-1941  MEDICAL RECORD NUMBER:  [id number]  DATE:  June 25, 2023    Home [facility]: [facility]    WOUND CARE ORDERS:  Bilateral Leg wounds: Cleanse with baby shampoo. Rinse and pat dry. Apply 2.5% Hydrocortisone cream to bilateral legs.  Cover open areas with Vaseline gauze, top with ABD pads. Secure with 2- layer calamine wrap. To be changed in clinic 07/01/2023      Activity:  [x]  Elevate leg(s) above the level of the heart when sitting.  [x]  Avoid prolonged standing in one place.   [x]  Do no get dressing/wrap wet.       Dietary:  []  Diet as tolerated      [x]  Diabetic Diet            []  Increase Protein: examples (Meat, cheese, eggs, greek yogurt, fish, nuts)          []  Juven Therapeutic Nutrition Powder  []  Other:       Return Appointment:  [x]  Return Appointment: With Dr. Jacklynn Bue 07/09/23  [x]  Nurse Visit : This Friday, 06/26/23 to check tolerance of wrap / plan of care. Wrap change visit 07/01/2023    []  Ordered tests:      Electronically signed Bevely Palmer, RN on 06/25/2023 at 9:21 AM     Wound Care Center Information: Should you experience any significant changes in your wound(s) or have questions about your wound care, please contact the [facility] Out[facility] Wound [facility] at [facility] - FRIDAY 8:00 am - 4:30.  If you need help with your wound outside these hours and cannot wait until we are again available, contact your PCP or go to the hospital emergency room.     PLEASE NOTE: IF YOU ARE UNABLE TO OBTAIN WOUND SUPPLIES, CONTINUE TO USE THE SUPPLIES YOU HAVE AVAILABLE UNTIL YOU ARE ABLE TO REACH [place]. IT IS MOST IMPORTANT TO KEEP THE WOUND COVERED AT ALL TIMES.     Physician Signature:_______________________    Date: ___________ Time:  ____________

## 2023-06-26 ENCOUNTER — Inpatient Hospital Stay: Admit: 2023-06-26 | Discharge: 2023-06-26 | Payer: MEDICARE | Attending: Radiation Oncology | Primary: Internal Medicine

## 2023-06-26 VITALS — BP 150/77 | HR 82 | Resp 18

## 2023-06-26 DIAGNOSIS — I87331 Chronic venous hypertension (idiopathic) with ulcer and inflammation of right lower extremity: Principal | ICD-10-CM

## 2023-06-26 NOTE — Other (Signed)
06/26/23 1150   Right Leg Edema Point of Measurement   Compression Therapy 2 layer compression wrap   Left Leg Edema Point of Measurement   Compression Therapy 2 layer compression wrap   Wound 06/18/23 Leg Right Circumferential   Date First Assessed/Time First Assessed: 06/18/23 0900   Present on Original Admission: Yes  Wound Approximate Age at First Assessment (Weeks): 4 weeks  Primary Wound Type: Venous Ulcer  Location: Leg  Wound Location Orientation: Right  Wound Descript...   Dressing Status New dressing applied   Dressing/Treatment Non adherent;ABD;Roll gauze;Other (comment);Petroleum gauze  (hydrocortisone cream, 2 layer wrap)   Offloading for Diabetic Foot Ulcers Offloading not required   Wound 06/18/23 Leg Left Circumferential   Date First Assessed/Time First Assessed: 06/18/23 0902   Present on Original Admission: Yes  Wound Approximate Age at First Assessment (Weeks): 4 weeks  Primary Wound Type: Venous Ulcer  Location: Leg  Wound Location Orientation: Left  Wound Descripti...   Dressing Status New dressing applied   Dressing/Treatment Non adherent;ABD;Roll gauze;Other (comment)  (hydrocortisone cream;vaseline gauze;2 layer wrap)   Offloading for Diabetic Foot Ulcers Offloading not required     Multilayer Compression Wrap   (Not Unna) Below the Knee    NAME:  Jamie Bryant  DATE OF BIRTH:  [birth year]  MEDICAL RECORD NUMBER:  [id number]  DATE:  July 08, 2023    Removed old dressing; wash with soap and water, Applied primary and secondary dressing as ordered, Placed compression to right lower leg, Place compression to left lower leg, Instructed patient/caregiver not to remove dressing and to keep it clean and dry, and Instructed patient/caregiver on complications to report to provider, such as pain, numbness in toes, heavy drainage, and slippage of dressing    Response to treatment: Well tolerated by patient      Electronically signed by Nolon Lennert, RN on 07/08/2023 at 3:24 PM

## 2023-06-26 NOTE — Other (Signed)
06/26/23 1125   Right Leg Edema Point of Measurement   Compression Therapy 2 layer compression wrap   Left Leg Edema Point of Measurement   Compression Therapy 2 layer compression wrap   Wound 06/18/23 Leg Right Circumferential   Date First Assessed/Time First Assessed: 06/18/23 0900   Present on Original Admission: Yes  Wound Approximate Age at First Assessment (Weeks): 4 weeks  Primary Wound Type: Venous Ulcer  Location: Leg  Wound Location Orientation: Right  Wound Descript...   Dressing Status Old drainage noted   Wound Cleansed Soap and water   Dressing/Treatment Non adherent;ABD;Roll gauze;Other (comment)  (hydrocortisone cream)   Offloading for Diabetic Foot Ulcers Offloading not required   Wound Assessment Pink/red;Slough   Drainage Amount Copious (>75 % saturated)   Drainage Description Serosanguinous   Odor None   Peri-wound Assessment Hemosiderin staining (brown yellow);Hyperpigmented   Margins Undefined edges   Wound Thickness Description not for Pressure Injury Full thickness   Wound 06/18/23 Leg Left Circumferential   Date First Assessed/Time First Assessed: 06/18/23 0902   Present on Original Admission: Yes  Wound Approximate Age at First Assessment (Weeks): 4 weeks  Primary Wound Type: Venous Ulcer  Location: Leg  Wound Location Orientation: Left  Wound Descripti...   Dressing Status New dressing applied   Wound Cleansed Soap and water   Dressing/Treatment Non adherent;ABD;Roll gauze;Other (comment)  (hydrocortisone cream)   Offloading for Diabetic Foot Ulcers Offloading not required   Wound Assessment Pink/red;Fluid filled blister;Slough   Drainage Amount Copious (>75 % saturated)   Drainage Description Serosanguinous   Odor None   Peri-wound Assessment Hemosiderin staining (brown yellow)   Margins Undefined edges   Wound Thickness Description not for Pressure Injury Full thickness   Pain Assessment   Pain Assessment 0-10   Pain Level 9   Patient's Stated Pain Goal 0 - No pain     BP (!) 150/77    Pulse 82   Resp 18

## 2023-06-29 NOTE — Telephone Encounter (Signed)
Spoke with RN from [facility]. Name/ DOB verified. Reports that patient has taken off her dressing at home. RN reports that she has supplies found in orders from her previous visit- and offers to change today. Advised to change today, will re-evaluate on Wednesday at next appointment. Dr. Claudie Fisherman made aware, agrees with plan.

## 2023-07-01 ENCOUNTER — Inpatient Hospital Stay
Admit: 2023-07-01 | Discharge: 2023-07-01 | Payer: Medicare Other | Attending: Radiation Oncology | Primary: Internal Medicine

## 2023-07-01 DIAGNOSIS — I872 Venous insufficiency (chronic) (peripheral): Secondary | ICD-10-CM

## 2023-07-01 DIAGNOSIS — L97222 Non-pressure chronic ulcer of left calf with fat layer exposed: Secondary | ICD-10-CM

## 2023-07-01 NOTE — Other (Signed)
07/01/23 1058   Left Leg Edema Point of Measurement   Compression Therapy 2 layer compression wrap   Wound 06/18/23 Leg Right Circumferential   Date First Assessed/Time First Assessed: 06/18/23 0900   Present on Original Admission: Yes  Wound Approximate Age at First Assessment (Weeks): 4 weeks  Primary Wound Type: Venous Ulcer  Location: Leg  Wound Location Orientation: Right  Wound Descript...   Dressing Status New dressing applied   Wound Cleansed Soap and water   Dressing/Treatment Non adherent;ABD;Other (comment)  (drawtex, hydrocortisone cream)   Offloading for Diabetic Foot Ulcers Offloading not required   Wound 06/18/23 Leg Left Circumferential   Date First Assessed/Time First Assessed: 06/18/23 0902   Present on Original Admission: Yes  Wound Approximate Age at First Assessment (Weeks): 4 weeks  Primary Wound Type: Venous Ulcer  Location: Leg  Wound Location Orientation: Left  Wound Descripti...   Dressing Status New dressing applied   Wound Cleansed Soap and water   Dressing/Treatment Non adherent;ABD;Other (comment)  (Drawtex, Hydrocortisone cream)   Offloading for Diabetic Foot Ulcers Offloading not required     Discharge Condition: Stable    Pain: 0    Ambulatory Status: Straight Cane    Discharge Destination: home    Transportation:car    Accompanied by: SELF    Discharge instructions reviewed with SELF and copy or written instructions have been provided. All questions/concerns have been addressed at this time.

## 2023-07-06 NOTE — Care Coordination-Inpatient (Signed)
07/06/23  Opened encounter in error

## 2023-07-09 ENCOUNTER — Inpatient Hospital Stay: Admit: 2023-07-09 | Discharge: 2023-07-09 | Payer: MEDICARE | Attending: Radiation Oncology | Primary: Internal Medicine

## 2023-07-09 DIAGNOSIS — I872 Venous insufficiency (chronic) (peripheral): Secondary | ICD-10-CM

## 2023-07-09 MED ORDER — DOXYCYCLINE HYCLATE 100 MG PO TABS
100 MG | ORAL_TABLET | Freq: Two times a day (BID) | ORAL | 0 refills | Status: AC
Start: 2023-07-09 — End: 2023-07-16

## 2023-07-09 NOTE — Other (Signed)
07/09/23 1024   Right Leg Edema Point of Measurement   Compression Therapy 2 layer compression wrap   Left Leg Edema Point of Measurement   Compression Therapy 2 layer compression wrap   Wound 06/18/23 Leg Right Circumferential   Date First Assessed/Time First Assessed: 06/18/23 0900   Present on Original Admission: Yes  Wound Approximate Age at First Assessment (Weeks): 4 weeks  Primary Wound Type: Venous Ulcer  Location: Leg  Wound Location Orientation: Right  Wound Descript...   Dressing Status New dressing applied   Dressing/Treatment Petroleum gauze;Other (comment);ABD  (hydrocortisone cream)   Wound 06/18/23 Leg Left Circumferential   Date First Assessed/Time First Assessed: 06/18/23 0902   Present on Original Admission: Yes  Wound Approximate Age at First Assessment (Weeks): 4 weeks  Primary Wound Type: Venous Ulcer  Location: Leg  Wound Location Orientation: Left  Wound Descripti...   Dressing Status New dressing applied   Dressing/Treatment ABD;Petroleum gauze;Other (comment)  (hydrocortisone cream)     Multilayer Compression Wrap   (Not Unna) Below the Knee    NAME:  Jamie Bryant  DATE OF BIRTH:  1941/10/31  MEDICAL RECORD NUMBER:  [id number]  DATE:  July 09, 2023    Removed old dressing; wash with soap and water, Applied primary and secondary dressing as ordered, Placed compression to right lower leg, Place compression to left lower leg, Instructed patient/caregiver not to remove dressing and to keep it clean and dry, and Instructed patient/caregiver on complications to report to provider, such as pain, numbness in toes, heavy drainage, and slippage of dressing    Response to treatment: Well tolerated by patient      Electronically signed by Nolon Lennert, RN on 07/09/2023 at 10:26 AM

## 2023-07-09 NOTE — Other (Signed)
07/09/23 0919   Anesthetic   Anesthetic 4% Lidocaine Cream   Right Leg Edema Point of Measurement   Leg circumference 42.5 cm   Ankle circumference 24 cm   Compression Therapy 2 layer compression wrap   Left Leg Edema Point of Measurement   Leg circumference 43.5 cm   Ankle circumference 24 cm   Compression Therapy 2 layer compression wrap   Peripheral Vascular   RLE Edema +1;Pitting   LLE Edema +1;Pitting   RLE Neurovascular Assessment   Capillary Refill Less than/Equal to 3 seconds   Color Yellow-Brown/Hemosiderin Staining   Temperature Warm   R Pedal Pulse +1   LLE Neurovascular Assessment   Capillary Refill Less than/Equal to 3 seconds   Color Yellow-Brown/Hemosiderin Staining   Temperature Warm   LLE Sensation  Full sensation   L Pedal Pulse +1   Wound 06/18/23 Leg Right Circumferential   Date First Assessed/Time First Assessed: 06/18/23 0900   Present on Original Admission: Yes  Wound Approximate Age at First Assessment (Weeks): 4 weeks  Primary Wound Type: Venous Ulcer  Location: Leg  Wound Location Orientation: Right  Wound Descript...   Wound Image      Wound Etiology Venous   Dressing Status Old drainage noted   Wound Cleansed Soap and water   Offloading for Diabetic Foot Ulcers Offloading not required   Wound Length (cm) 18 cm   Wound Width (cm) 41 cm   Wound Depth (cm) 0.1 cm   Wound Surface Area (cm^2) 738 cm^2   Change in Wound Size % (l*w) -90.45   Wound Volume (cm^3) 73.8 cm^3   Wound Healing % 5   Wound Assessment Pink/red;Slough   Drainage Amount Large (50-75% saturated)   Drainage Description Serosanguinous   Odor None   Peri-wound Assessment Hemosiderin staining (brown yellow);Maceration   Margins Undefined edges   Wound Thickness Description not for Pressure Injury Full thickness   Wound 06/18/23 Leg Left Circumferential   Date First Assessed/Time First Assessed: 06/18/23 0902   Present on Original Admission: Yes  Wound Approximate Age at First Assessment (Weeks): 4 weeks  Primary Wound Type:  Venous Ulcer  Location: Leg  Wound Location Orientation: Left  Wound Descripti...   Wound Image      Wound Etiology Venous   Dressing Status Old drainage noted   Wound Cleansed Soap and water   Offloading for Diabetic Foot Ulcers Offloading not required   Wound Length (cm) 18 cm   Wound Width (cm) 41 cm   Wound Depth (cm) 0.1 cm   Wound Surface Area (cm^2) 738 cm^2   Change in Wound Size % (l*w) -436.34   Wound Volume (cm^3) 73.8 cm^3   Wound Healing % -436   Wound Assessment Pink/red;Slough   Drainage Amount Copious (>75 % saturated)   Drainage Description Serosanguinous   Odor None   Peri-wound Assessment Hemosiderin staining (brown yellow);Induration   Margins Undefined edges   Wound Thickness Description not for Pressure Injury Full thickness   Pain Assessment   Pain Assessment 0-10   Pain Level 8   Patient's Stated Pain Goal 0 - No pain   Pain Location Leg   Pain Orientation Left;Right   Pain Descriptors Aching   Pain Type Chronic pain   BP 108/70   Pulse 58   Temp 97.8 F (36.6 C) (Temporal)   Resp 18

## 2023-07-09 NOTE — Patient Instructions (Addendum)
Discharge Instructions for  [facility]  [address]  [place], [place] [address]  Phone: [phone] Fax: [phone]    NAME:  Jamie Bryant  DATE OF BIRTH:  [birth year]  MEDICAL RECORD NUMBER:  [id number]  DATE:  July 09, 2023    Home [facility]: [facility]    WOUND CARE ORDERS:  Bilateral Leg wounds: Cleanse with baby shampoo. Rinse and pat dry. Apply 2.5% Hydrocortisone cream to bilateral legs.  Cover open areas with Vaseline gauze, top with ABD pads then superabsorber. Secure with 2- layer compression wrap.     Please start antibiotic (Doxycycline) that we have sent to your pharmacy.      Activity:  [x]  Elevate leg(s) above the level of the heart when sitting.  [x]  Avoid prolonged standing in one place.   [x]  Do no get dressing/wrap wet.       Dietary:  []  Diet as tolerated      [x]  Diabetic Diet            []  Increase Protein: examples (Meat, cheese, eggs, greek yogurt, fish, nuts)          []  Juven Therapeutic Nutrition Powder  []  Other:       Return Appointment:  [x]  Return Appointment: With Dr. Jacklynn Bue in 1 week.  []  Nurse Visit :     []  Ordered tests:      Electronically signed Loretha Brasil, RN on 07/09/2023 at 9:41 AM     Wound Care Center Information: Should you experience any significant changes in your wound(s) or have questions about your wound care, please contact the [facility] at [facility] - FRIDAY 8:00 am - 4:30.  If you need help with your wound outside these hours and cannot wait until we are again available, contact your PCP or go to the hospital emergency room.     PLEASE NOTE: IF YOU ARE UNABLE TO OBTAIN WOUND SUPPLIES, CONTINUE TO USE THE SUPPLIES YOU HAVE AVAILABLE UNTIL YOU ARE ABLE TO REACH [place]. IT IS MOST IMPORTANT TO KEEP THE WOUND COVERED AT ALL TIMES.     Physician Signature:_______________________    Date: ___________ Time:  ____________

## 2023-07-09 NOTE — Progress Notes (Signed)
[facility]   Wound Care and [facility] Center   Medical Staff Progress Note     Jamie Bryant  MEDICAL RECORD NUMBER:  [id number]  AGE: 82 y.o.   GENDER: female  DOB: 11-17-1941  EPISODE DATE:  07/09/2023    Chief complaint and reason for visit:     Chief Complaint   Patient presents for evaluation of her recurrent bilateral lower leg venous ulcers     Follow-up     "Wound care"      Patient presenting for follow up evaluation of wound(s) per chief complaint.      Subjective and ROS:  [name],  a 82 yo female with history of DM 2; HTN, CVA CHF, Parkinson's, delusion, chronic asthma, alleged dementia, CRF and chronic venous insufficiency of the lower legs, returns to the [facility] at the request of her PCP, Dr Jamie Bryant,  for evaluation of her recurrent bilateral lower leg venous ulcers.   Patient was seen back in March, 2024 for wound care to her lower legs with venous ulcers. She failed to return for follow up after 3 visits and wounds incompletely healed.       Most recently according to the patient she was hospitalized at a [facility] hospital for CHF and then transferred to a nursing facility ([facility]).   After discharge or during her medical care, she developed new bilateral lower leg venous ulcers spontaneously. She complains of pain but cannot quantitated; has heavy drainage without odor. Has not use any compression stockings of late.  Claims she has some Santyl at home that was ordered earlier this year but never used.       Has diabetes and unclear about her A1c.  Chart shows A1C of 6.3 on 06/09/23.  Denies smoking.  Patient states she is allergic to Lidocaine and decline to have it used today.     June 25, 2023-  On follow up, [name] feels worst.  Complains of the itch and pain in both the lower leg. Patient has been taking off her compression stockings off and on, not liking it or just doing it. Comes in without any dressings today.  Has new blisters that are sensitive and  pruritic.  She has taken Benadryl.   Complains of lower legs painful.  Denies any fever.  Has no SOB or chest pains.    July 09, 2023- since last seen, [name]  has had her dressing changed at the Wound Care clinic to help get her wounds cleaned and provide the proper compression she needs.  She had been taking off her compression stocking due to discomfort or confusion.  She is allergic to lidocaine and unable to mechanically remove the slough.  Use of hydrocortisone cream and vaseline gauze and ABD and Drawtex have helped with her wound.   She returns with less edematous lower legs and more content.    History of Wound Context: Per original history and physical on this patient. 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  Sensitive to touch but allowed for light brushing of wounds with a clean gauze. Lower legs is more erythematous and warm to touch.  She has no fever.   Treatment - Debridement is recommended; However, with her sensitivity to lidocaine, this cannot be done and declined by patient.   Unclear if she is with early cellulitis.  Will cover with antibiotics  Wound care - Patient to continue cleanse with baby shampoo; Apply 2.5 % hydrocortisone cream and to cover areas with vaseline gauze, top with  ABD and superabsorber.  2 layer compression wrap   Have patient return in one week  for wrap changes.   Delusion -may be contributing to her noncompliance.  Lives  alone.       Other associated diagnoses or problems addressed:  none    Pertinent imaging reviewed including independent interpretation include:  None    Pertinent labs reviewed.   Medical records and review of external note (s) from other providers done as well.    New lab or imaging orders placed:  None    Prescription drug management:   Doxycycline 7 day course    Discussion of management or test interpretation with  patient who comes alone .    Comorbid conditions affecting wound healing: As per PMH which was reviewed.    Risk of complications and/or mortality of patient management:  This patient has a minimal risk of morbidity and mortality from additional diagnostic testing or treatment. This is due to the above conditions affecting wound healing as well as patient and procedure risk factors. Education and discussion held with patient regarding these disease processes pertinent to wound(s).  Other pertinent decisions include: N/A .  The patient's diagnosis or treatment is  significantly limited by social determinants of health as noted by:  mental health .    Wound 06/18/23 Leg Right Circumferential (Active)   Wound Image     07/09/23 0919   Wound Etiology Venous 07/09/23 0919   Dressing Status New dressing applied 07/09/23 1024   Wound Cleansed Soap and water 07/09/23 0919   Dressing/Treatment Petroleum gauze;Other (comment);ABD 07/09/23 1024   Offloading for Diabetic Foot Ulcers Offloading not required 07/09/23 0919   Wound Length (cm) 18 cm 07/09/23 0919   Wound Width (cm) 41 cm 07/09/23 0919   Wound Depth (cm) 0.1 cm 07/09/23 0919   Wound Surface Area (cm^2) 738 cm^2 07/09/23 0919   Change in Wound Size % (l*w) -90.45 07/09/23 0919   Wound Volume (cm^3) 73.8 cm^3 07/09/23 0919   Wound Healing % 5 07/09/23 0919   Wound Assessment Pink/red;Slough 07/09/23 0919   Drainage Amount Large (50-75% saturated) 07/09/23 0919   Drainage Description Serosanguinous 07/09/23 0919   Odor None 07/09/23 0919   Peri-wound  Assessment Hemosiderin staining (brown yellow);Maceration 07/09/23 0919   Margins Undefined edges 07/09/23 0919   Wound Thickness Description not for Pressure Injury Full thickness 07/09/23 0919   Number of days: 26       Wound 06/18/23 Leg Left Circumferential (Active)   Wound Image     07/09/23 0919   Wound Etiology Venous 07/09/23 0919   Dressing Status New dressing applied 07/09/23 1024   Wound Cleansed Soap and water 07/09/23 0919   Dressing/Treatment ABD;Petroleum gauze;Other (comment) 07/09/23 1024  Offloading for Diabetic Foot Ulcers Offloading not required 07/09/23 0919   Wound Length (cm) 18 cm 07/09/23 0919   Wound Width (cm) 41 cm 07/09/23 0919   Wound Depth (cm) 0.1 cm 07/09/23 0919   Wound Surface Area (cm^2) 738 cm^2 07/09/23 0919   Change in Wound Size % (l*w) -436.34 07/09/23 0919   Wound Volume (cm^3) 73.8 cm^3 07/09/23 0919   Wound Healing % -436 07/09/23 0919   Wound Assessment Pink/red;Slough 07/09/23 0919   Drainage Amount Copious (>75 % saturated) 07/09/23 0919   Drainage Description Serosanguinous 07/09/23 0919   Odor None 07/09/23 0919   Peri-wound Assessment Hemosiderin staining (brown yellow);Induration 07/09/23 0919   Margins Undefined edges 07/09/23 0919   Wound Thickness Description not for Pressure Injury Full thickness 07/09/23 0919   Number of days: 26          Procedures done during this encounter:   Debridement: Other: patient cannot tolerate debridement and thus not performed    TIME: E/M Time spent with patient and/or patient care issues: [x]  15-20 min  []  21-30 min  []  31-44 min  []  45 min or more.   This is above the usual time needed to address patient's chief complaint today: [x]  Yes  []  No  This time includes physician non-face-to-face service time visit on the date of service such as  Preparing to see the patient (eg, review of tests)  Obtaining and/or reviewing separately obtained history  Performing a medically necessary appropriate examination and/or  evaluation  Counseling and educating the patient/family/caregiver  Ordering medications, tests, or procedures  Referring and communicating with other health care professionals as needed  Documenting clinical information in the electronic or other health record  Independently interpreting results (not reported separately) and communicating results to the patient/family/caregiver  Care coordination (not reported separately)    Objective:    BP 108/70   Pulse 58   Temp 97.8 F (36.6 C) (Temporal)   Resp 18   Wt Readings from Last 3 Encounters:   07/10/23 98 kg (216 lb 1.6 oz)   06/18/23 99.8 kg (220 lb)   06/09/23 99.9 kg (220 lb 4.8 oz)       PHYSICAL EXAM  General: Alert and in no acute distress. Normal appearing. Elderly cooperative  Skin: Warm and dry, no rash  Head: Normocephalic and atraumatic  Eyes: Extraocular eye movements intact, conjunctivae normal, and sclera anicteric  ENT: Hearing grossly normal bilaterally. Normal appearance  Respiratory:  no respiratory distress  Musculoskeletal: Baseline range of motion in joints. Nontender calves. No cyanosis. Edema 1+  Bilateral lower leg - see above and description.  Increased erythema and warmth..  Neurologic: Speech normal. At baseline without new focal deficits. Mental status normal or at baseline    PAST MEDICAL HISTORY        Diagnosis Date    Arthritis     Asthma     Chronic renal disease, stage III (HCC) [062376] 10/13/2022    Depression     Diabetes (HCC)     Fibromyalgia     Fibromyalgia     Gastroparesis     Hyperlipemia     Hypertension     Neuropathy     Psychotic disorder (HCC)     Stroke (HCC)        PAST SURGICAL HISTORY    Past Surgical History:   Procedure Laterality Date    COLONOSCOPY      HYSTERECTOMY (CERVIX STATUS UNKNOWN)  FAMILY HISTORY    Family History   Problem Relation Age of Onset    Breast Cancer Maternal Grandmother     Cancer Father         lung ca    Hypertension Father     Diabetes Mother     Hypertension Mother         SOCIAL HISTORY    Social History     Tobacco Use    Smoking status: Never     Passive exposure: Never    Smokeless tobacco: Never   Vaping Use    Vaping Use: Never used   Substance Use Topics    Alcohol use: No    Drug use: No       ALLERGIES    Allergies   Allergen Reactions    Iodinated Contrast Media Shortness Of Breath    Statins Myalgia     Lovastatin, pravastatin, simvastatin, atorvastatin, zetia, livalo, lovaza    Adhesive Tape Rash    Ampicillin Rash    Gabapentin Nausea And Vomiting    Lidocaine Rash       MEDICATIONS    Current Outpatient Medications on File Prior to Encounter   Medication Sig Dispense Refill    lisinopril (PRINIVIL;ZESTRIL) 40 MG tablet Take 1 tablet by mouth daily (Patient not taking: Reported on 06/18/2023)      pregabalin (LYRICA) 300 MG capsule Take 1 capsule by mouth 2 times daily for 30 days. Max Daily Amount: 600 mg 60 capsule 0    quinapril (ACCUPRIL) 40 MG tablet Take 1 tablet by mouth daily (Patient not taking: Reported on 04/20/2023)  0    torsemide (DEMADEX) 20 MG tablet Take 5 tablets by mouth daily (Patient taking differently: Take 2 tablets by mouth daily)      linaclotide (LINZESS) 290 MCG CAPS capsule Take 1 capsule by mouth every morning (before breakfast) 30 capsule     beclomethasone (QVAR REDIHALER) 40 MCG/ACT AERB inhaler Inhale 1 puff into the lungs in the morning and 1 puff in the evening. (Patient not taking: Reported on 07/09/2023)      amLODIPine (NORVASC) 5 MG tablet Take 1 tablet by mouth daily (Patient not taking: Reported on 04/20/2023)  0    PARoxetine (PAXIL) 10 MG tablet Take 1 tablet by mouth daily (Patient not taking: Reported on 06/18/2023)  0    albuterol-ipratropium (COMBIVENT RESPIMAT) 20-100 MCG/ACT AERS inhaler Inhale 1 puff into the lungs every 6 hours      insulin glargine (LANTUS) 100 UNIT/ML injection vial Inject 14 Units into the skin nightly (Patient not taking: Reported on 06/18/2023) 10 mL 3    insulin lispro (HUMALOG) 100 UNIT/ML SOLN  injection vial Inject 0-4 Units into the skin 4 times daily (before meals and nightly)      linezolid (ZYVOX) 600 MG tablet Take 1 tablet by mouth 2 times daily Bid FOR 14 DAYS (Patient not taking: Reported on 03/05/2023)      pregabalin (LYRICA) 300 MG capsule Take 1 capsule by mouth 2 times daily for 180 days. Max Daily Amount: 600 mg 180 capsule 1    arformoterol tartrate 15 MCG/2ML NEBU 15 mcg, budesonide 0.25 MG/2ML SUSP 250 mcg Take 1 Dose by nebulization in the morning and 1 Dose in the evening. (Patient not taking: Reported on 12/18/2022) 60 Units 11    albuterol sulfate HFA (VENTOLIN HFA) 108 (90 Base) MCG/ACT inhaler Inhale 2 puffs into the lungs 4 times daily as needed for Wheezing (Patient not taking:  Reported on 12/18/2022) 54 g 1    albuterol (PROVENTIL) (2.5 MG/3ML) 0.083% nebulizer solution Take 3 mLs by nebulization 4 times daily (Patient not taking: Reported on 04/20/2023) 120 each 3    QUEtiapine (SEROQUEL) 25 MG tablet Take 0.5 tablets by mouth nightly (Patient not taking: Reported on 02/16/2023) 60 tablet 3    arformoterol tartrate (BROVANA) 15 MCG/2ML NEBU Take 1 ampule by nebulization 2 times daily (Patient not taking: Reported on 12/18/2022) 120 mL 3    budesonide (PULMICORT) 0.25 MG/2ML nebulizer suspension Take 2 mLs by nebulization 2 times daily (Patient not taking: Reported on 12/18/2022) 60 each 3    albuterol sulfate HFA (VENTOLIN HFA) 108 (90 Base) MCG/ACT inhaler Inhale 2 puffs into the lungs 4 times daily as needed for Wheezing (Patient not taking: Reported on 04/20/2023) 18 g 0    carvedilol (COREG) 12.5 MG tablet Take 1 tablet by mouth 2 times daily      Multiple Vitamins-Minerals (THERAPEUTIC MULTIVITAMIN-MINERALS) tablet Take 1 tablet by mouth daily      aspirin 81 MG chewable tablet Take 1 tablet by mouth daily 30 tablet 11    vitamin D3 (CHOLECALCIFEROL) 125 MCG (5000 UT) TABS tablet Take 1 tablet by mouth daily 30 tablet 11    SITagliptin (JANUVIA) 100 MG tablet Take 1 tablet by  mouth daily 90 tablet 3    quinapril (ACCUPRIL) 40 MG tablet quinapril 40 mg tablet   TAKE 1 TABLET BY MOUTH EVERY NIGHT FOR HIGH BLOOD PRESSURE (Patient not taking: Reported on 12/19/2022) 90 tablet 3    REPATHA SURECLICK 140 MG/ML SOAJ INJECT AS DIRECTED EVERY 2 WEEKS 2 Adjustable Dose Pre-filled Pen Syringe 11    insulin lispro, 1 Unit Dial, (HUMALOG/ADMELOG) 100 UNIT/ML SOPN Humalog KwikPen (U-100) Insulin 100 unit/mL subcutaneous   INJECT UP TO 50 UNITS UNDER THE SKIN DAILY AS DIRECTED (Patient not taking: Reported on 04/20/2023)      ipratropium (ATROVENT HFA) 17 MCG/ACT inhaler Atrovent HFA 17 mcg/actuation aerosol inhaler (Patient not taking: Reported on 02/16/2023)      linaclotide (LINZESS) 290 MCG CAPS capsule Take 1 capsule by mouth as needed (Patient not taking: Reported on 06/09/2023)      olopatadine (PATANOL) 0.1 % ophthalmic solution Apply 2 drops to eye 2 times daily (Patient not taking: Reported on 04/20/2023)       No current facility-administered medications on file prior to encounter.         Written patient dismissal instructions given to patient and signed by patient or POA.         Electronically signed by Ernestina Penna, MD on 07/14/2023 at 1:50 PM

## 2023-07-10 ENCOUNTER — Ambulatory Visit: Admit: 2023-07-10 | Discharge: 2023-07-10 | Payer: MEDICARE | Attending: Internal Medicine | Primary: Internal Medicine

## 2023-07-10 DIAGNOSIS — K2100 Gastro-esophageal reflux disease with esophagitis, without bleeding: Secondary | ICD-10-CM

## 2023-07-10 MED ORDER — OMEPRAZOLE 40 MG PO CPDR
40 | ORAL_CAPSULE | Freq: Every day | ORAL | 0 refills | Status: DC
Start: 2023-07-10 — End: 2024-08-02

## 2023-07-10 NOTE — Progress Notes (Signed)
Chief Complaint   Patient presents with    Cholesterol Problem    Diabetes    Hypertension     "Have you been to the ER, urgent care clinic since your last visit?  Hospitalized since your last visit?"    NO    "Have you seen or consulted any other health care providers outside of [facility] since your last visit?"    NO            Click Here for Release of Records Request

## 2023-07-11 NOTE — Progress Notes (Signed)
[facility]  [address]  [address] [place] [address]  Phone:  [phone]  Fax: [phone]       Chief Complaint   Patient presents with    Cholesterol Problem    Diabetes    Hypertension   .      SUBJECTIVE:    Jamie Bryant is a 82 y.o. female Comes in for return visit, stating that she has done well. Her ulcers are healing nicely. She has two dressings and there is no evidence of any fluid saturation.  They look better than they have looked in a long time externally. She follows up with the wound healing people on a regular basis.    I emphasized to her the importance of being euglycemic, which facilitates the healing process.  Historically her blood sugars have been excellent.  She does see an endocrinologist.  Because of this, I will make no adjustments in her insulin.    She remains morbidly obese.    She has a history of chronic venous insufficiency leading to the chronic swelling of her lower extremities.    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    She has a past history of primary hypertension and dyslipidemia.             Current Outpatient Medications   Medication Sig Dispense Refill    omeprazole (PRILOSEC) 40 MG delayed release capsule Take 1 capsule by mouth every morning (before breakfast) 30 capsule 0    torsemide (DEMADEX) 20 MG tablet Take 5 tablets by mouth daily (Patient taking differently: Take 2 tablets by mouth daily)      linaclotide (LINZESS) 290 MCG CAPS capsule Take 1 capsule by mouth every morning (before breakfast) 30 capsule     albuterol-ipratropium (COMBIVENT RESPIMAT) 20-100 MCG/ACT AERS inhaler Inhale 1 puff into the lungs every 6 hours      insulin lispro (HUMALOG) 100 UNIT/ML SOLN injection vial Inject 0-4 Units into the skin 4 times daily (before meals and nightly)      pregabalin (LYRICA) 300 MG capsule Take 1 capsule by mouth 2 times daily for  180 days. Max Daily Amount: 600 mg 180 capsule 1    carvedilol (COREG) 12.5 MG tablet Take 1 tablet by mouth 2 times daily      Multiple Vitamins-Minerals (THERAPEUTIC MULTIVITAMIN-MINERALS) tablet Take 1 tablet by mouth daily      aspirin 81 MG chewable tablet Take 1 tablet by mouth daily 30 tablet 11    vitamin D3 (CHOLECALCIFEROL) 125 MCG (5000 UT) TABS tablet Take 1 tablet by mouth daily 30 tablet 11    SITagliptin (JANUVIA) 100 MG tablet Take 1 tablet by mouth daily 90 tablet 3    REPATHA SURECLICK 140 MG/ML SOAJ INJECT AS DIRECTED EVERY 2 WEEKS 2 Adjustable Dose Pre-filled Pen Syringe 11    doxycycline hyclate (VIBRA-TABS) 100 MG tablet Take 1 tablet by mouth 2 times daily for 7 days (Patient not taking: Reported on 07/10/2023) 14 tablet 0    lisinopril (PRINIVIL;ZESTRIL) 40 MG tablet Take 1 tablet by mouth daily (Patient not taking: Reported on 06/18/2023)      pregabalin (LYRICA) 300 MG capsule Take 1 capsule by mouth 2 times daily for 30 days. Max Daily Amount: 600 mg 60 capsule 0    quinapril (ACCUPRIL) 40 MG tablet Take 1 tablet by mouth daily (Patient  not taking: Reported on 04/20/2023)  0    beclomethasone (QVAR REDIHALER) 40 MCG/ACT AERB inhaler Inhale 1 puff into the lungs in the morning and 1 puff in the evening. (Patient not taking: Reported on 07/09/2023)      amLODIPine (NORVASC) 5 MG tablet Take 1 tablet by mouth daily (Patient not taking: Reported on 04/20/2023)  0    PARoxetine (PAXIL) 10 MG tablet Take 1 tablet by mouth daily (Patient not taking: Reported on 06/18/2023)  0    insulin glargine (LANTUS) 100 UNIT/ML injection vial Inject 14 Units into the skin nightly (Patient not taking: Reported on 06/18/2023) 10 mL 3    linezolid (ZYVOX) 600 MG tablet Take 1 tablet by mouth 2 times daily Bid FOR 14 DAYS (Patient not taking: Reported on 03/05/2023)      arformoterol tartrate 15 MCG/2ML NEBU 15 mcg, budesonide 0.25 MG/2ML SUSP 250 mcg Take 1 Dose by nebulization in the morning and 1 Dose in the evening.  (Patient not taking: Reported on 12/18/2022) 60 Units 11    albuterol sulfate HFA (VENTOLIN HFA) 108 (90 Base) MCG/ACT inhaler Inhale 2 puffs into the lungs 4 times daily as needed for Wheezing (Patient not taking: Reported on 12/18/2022) 54 g 1    albuterol (PROVENTIL) (2.5 MG/3ML) 0.083% nebulizer solution Take 3 mLs by nebulization 4 times daily (Patient not taking: Reported on 04/20/2023) 120 each 3    QUEtiapine (SEROQUEL) 25 MG tablet Take 0.5 tablets by mouth nightly (Patient not taking: Reported on 02/16/2023) 60 tablet 3    arformoterol tartrate (BROVANA) 15 MCG/2ML NEBU Take 1 ampule by nebulization 2 times daily (Patient not taking: Reported on 12/18/2022) 120 mL 3    budesonide (PULMICORT) 0.25 MG/2ML nebulizer suspension Take 2 mLs by nebulization 2 times daily (Patient not taking: Reported on 12/18/2022) 60 each 3    albuterol sulfate HFA (VENTOLIN HFA) 108 (90 Base) MCG/ACT inhaler Inhale 2 puffs into the lungs 4 times daily as needed for Wheezing (Patient not taking: Reported on 04/20/2023) 18 g 0    quinapril (ACCUPRIL) 40 MG tablet quinapril 40 mg tablet   TAKE 1 TABLET BY MOUTH EVERY NIGHT FOR HIGH BLOOD PRESSURE (Patient not taking: Reported on 12/19/2022) 90 tablet 3    insulin lispro, 1 Unit Dial, (HUMALOG/ADMELOG) 100 UNIT/ML SOPN Humalog KwikPen (U-100) Insulin 100 unit/mL subcutaneous   INJECT UP TO 50 UNITS UNDER THE SKIN DAILY AS DIRECTED (Patient not taking: Reported on 04/20/2023)      ipratropium (ATROVENT HFA) 17 MCG/ACT inhaler Atrovent HFA 17 mcg/actuation aerosol inhaler (Patient not taking: Reported on 02/16/2023)      linaclotide (LINZESS) 290 MCG CAPS capsule Take 1 capsule by mouth as needed (Patient not taking: Reported on 06/09/2023)      olopatadine (PATANOL) 0.1 % ophthalmic solution Apply 2 drops to eye 2 times daily (Patient not taking: Reported on 04/20/2023)       No current facility-administered medications for this visit.     Past Medical History:   Diagnosis Date     Arthritis     Asthma     Chronic renal disease, stage III [facility]) [[id number]] 10/13/2022    Depression     Diabetes (HCC)     Fibromyalgia     Fibromyalgia     Gastroparesis     Hyperlipemia     Hypertension     Neuropathy     Psychotic disorder (HCC)     Stroke [facility])      Past Surgical  History:   Procedure Laterality Date    COLONOSCOPY      HYSTERECTOMY (CERVIX STATUS UNKNOWN)       Allergies   Allergen Reactions    Iodinated Contrast Media Shortness Of Breath    Statins Myalgia     Lovastatin, pravastatin, simvastatin, atorvastatin, zetia, livalo, lovaza    Adhesive Tape Rash    Ampicillin Rash    Gabapentin Nausea And Vomiting    Lidocaine Rash         REVIEW OF SYSTEMS:  General: negative for - chills or fever  ENT: negative for - headaches, nasal congestion or tinnitus  Respiratory: negative for - cough, hemoptysis, shortness of breath or wheezing  Cardiovascular : negative for - chest pain, edema, palpitations or shortness of breath  Gastrointestinal: negative for - abdominal pain, blood in stools, heartburn or nausea/vomiting  Genito-Urinary: no dysuria, trouble voiding, or hematuria  Musculoskeletal: negative for - gait disturbance, joint pain, joint stiffness or joint swelling  Neurological: no TIA or stroke symptoms  Hematologic: no bruises, no bleeding, no swollen glands  Integument: no lumps, mole changes, nail changes or rash  Endocrine: no malaise/lethargy or unexpected weight changes      Social History     Socioeconomic History    Marital status: Divorced     Spouse name: None    Number of children: 2    Years of education: None    Highest education level: Bachelor's degree (e.g., BA, AB, BS)   Tobacco Use    Smoking status: Never     Passive exposure: Never    Smokeless tobacco: Never   Vaping Use    Vaping Use: Never used   Substance and Sexual Activity    Alcohol use: No    Drug use: No    Sexual activity: Not Currently     Comment: divorced,2 children,retired,living in [facility]    Social History Narrative    Nothing in the past 5 years     Social Determinants of Health     Financial Resource Strain: Low Risk  (06/09/2023)    Overall Financial Resource Strain (CARDIA)     Difficulty of Paying Living Expenses: Not hard at all   Food Insecurity: No Food Insecurity (06/09/2023)    Hunger Vital Sign     Worried About Running Out of Food in the Last Year: Never true     Ran Out of Food in the Last Year: Never true   Transportation Needs: No Transportation Needs (06/09/2023)    PRAPARE - Therapist, art (Medical): No     Lack of Transportation (Non-Medical): No   Physical Activity: Inactive (05/26/2023)    Exercise Vital Sign     Days of Exercise per Week: 0 days     Minutes of Exercise per Session: 0 min   Stress: No Stress Concern Present (05/26/2023)    [employer] of Occupational Health - Occupational Stress Questionnaire     Feeling of Stress : Only a little   Social Connections: Socially Isolated (05/26/2023)    Social Connection and Isolation Panel [NHANES]     Frequency of Communication with Friends and Family: Once a week     Frequency of Social Gatherings with Friends and Family: Once a week     Attends Religious Services: Never     Database administrator or Organizations: No     Attends Banker Meetings: Never     Marital  Status: Divorced   Catering manager Violence: Not At Risk (05/26/2023)    Humiliation, Afraid, Rape, and Kick questionnaire     Fear of Current or Ex-Partner: No     Emotionally Abused: No     Physically Abused: No     Sexually Abused: No   Housing Stability: Low Risk  (06/09/2023)    Housing Stability Vital Sign     Unable to Pay for Housing in the Last Year: No     Number of Places Lived in the Last Year: 1     Unstable Housing in the Last Year: No     Family History   Problem Relation Age of Onset    Breast Cancer Maternal Grandmother     Cancer Father         lung ca    Hypertension Father     Diabetes Mother     Hypertension  Mother        OBJECTIVE:    BP 128/71 (Site: Left Upper Arm, Position: Sitting)   Pulse 81   Temp 97.7 F (36.5 C) (Oral)   Resp 20   Ht 1.626 m (5\' 4" )   Wt 98 kg (216 lb 1.6 oz)   SpO2 93%   BMI 37.09 kg/m   CONSTITUTIONAL: well , well nourished, appears age appropriate  EYES: perrla, eom intact  ENMT:moist mucous membranes, pharynx clear  NECK: supple. Thyroid normal  RESPIRATORY: Chest: clear to ascultation and percussion   CARDIOVASCULAR: Heart: regular rate and rhythm  GASTROINTESTINAL: Abdomen: soft, bowel sounds active  HEMATOLOGIC: no pathological lymph nodes palpated  MUSCULOSKELETAL: Extremities: no edema, pulse 1+   INTEGUMENT: No unusual rashes or suspicious skin lesions noted. Nails appear normal.  NEUROLOGIC: non-focal exam   MENTAL STATUS: alert and oriented, appropriate affect      ASSESSMENT:  1. Gastroesophageal reflux disease with esophagitis without hemorrhage    2. Venous stasis ulcer of left calf with fat layer exposed without varicose veins (HCC)    3. Type 2 diabetes mellitus with diabetic neuropathy, without long-term current use of insulin (HCC)    4. Severe obesity (BMI 35.0-39.9) with comorbidity (HCC)    5. Essential (primary) hypertension    6. Dyslipidemia    7. Delusion (HCC)        PLAN:  1. The patient has a history of gastroesophageal reflux and remains on her PPI.  2. As far as her stasis dermatitis is concerned, progress is actively being made. She has somewhat of an Unna boot placed on both legs, which is quite effective.  3. Her diabetes based on previous hemoglobin A1Cs has been excellent.  She will follow up with the endocrinologist.  4. She definitely needs to lose weight.  This can be accomplished by eating meals eliminating snacks and avoiding the consumption of processed carbohydrates.  5. She remains on a statin in view of her increased cardiovascular risk.  6. As far as her delusional state, which she addresses, nothing need be done for now.  She is alert and  oriented x three.                   Arbutus Ped, MD

## 2023-07-16 ENCOUNTER — Inpatient Hospital Stay: Admit: 2023-07-16 | Discharge: 2023-07-16 | Payer: MEDICARE | Attending: Surgery | Primary: Internal Medicine

## 2023-07-16 DIAGNOSIS — I872 Venous insufficiency (chronic) (peripheral): Secondary | ICD-10-CM

## 2023-07-16 DIAGNOSIS — L97222 Non-pressure chronic ulcer of left calf with fat layer exposed: Secondary | ICD-10-CM

## 2023-07-16 NOTE — Progress Notes (Signed)
WOUND CARE PROGRESS       Subjective:     Patient   Jamie Bryant,  a 82 yo female with history of DM 2; HTN, CVA CHF, Parkinson's, delusion, chronic asthma, alleged dementia, CRF and chronic venous insufficiency of the lower legs, returns to the [facility] at the request of her PCP, [name],  for evaluation of her recurrent bilateral lower leg venous ulcers.   Patient was seen back in March, 2024 for wound care to her lower legs with venous ulcers. She failed to return for follow up after 3 visits and wounds incompletely healed.       Most recently according to the patient she was hospitalized at a [facility] hospital for CHF and then transferred to a nursing facility ([facility]).   After discharge or during her medical care, she developed new bilateral lower leg venous ulcers spontaneously. She complains of pain but cannot quantitated; has heavy drainage without odor. Has not use any compression stockings of late.  Claims she has some Santyl at home that was ordered earlier this year but never used.       Has diabetes and unclear about her A1c.  Chart shows A1C of 6.3 on 06/09/23.  Denies smoking.  Patient states she is allergic to Lidocaine and decline to have it used today.     June 25, 2023-  On follow up, Jamie Bryant feels worst.  Complains of the itch and pain in both the lower leg. Patient has been taking off her compression stockings off and on, not liking it or just doing it. Comes in without any dressings today.  Has new blisters that are sensitive and pruritic.  She has taken Benadryl.   Complains of lower legs painful.  Denies any fever.  Has no SOB or chest pains.     July 09, 2023- since last seen, Jamie Bryant  has had her dressing changed at the Wound Care clinic to help get her wounds cleaned and provide the proper compression she needs.  She had been taking off her compression stocking due to discomfort or confusion.  She is allergic to lidocaine and unable to mechanically remove the slough.  Use of  hydrocortisone cream and vaseline gauze and ABD and Drawtex have helped with her wound.   She returns with less edematous lower legs and more content.    07/09/23 1024    Right Leg Edema Point of Measurement   Compression Therapy 2 layer compression wrap   Left Leg Edema Point of Measurement   Compression Therapy 2 layer compression wrap   Wound 06/18/23 Leg Right Circumferential   Date First Assessed/Time First Assessed: 06/18/23 0900   Present on Original Admission: Yes  Wound Approximate Age at First Assessment (Weeks): 4 weeks  Primary Wound Type: Venous Ulcer  Location: Leg  Wound Location Orientation: Right  Wound Descript...   Dressing Status New dressing applied   Dressing/Treatment Petroleum gauze;Other (comment);ABD  (hydrocortisone cream)   Wound 06/18/23 Leg Left Circumferential   Date First Assessed/Time First Assessed: 06/18/23 0902   Present on Original Admission: Yes  Wound Approximate Age at First Assessment (Weeks): 4 weeks  Primary Wound Type: Venous Ulcer  Location: Leg  Wound Location Orientation: Left  Wound Descripti...   Dressing Status New dressing applied   Dressing/Treatment ABD;Petroleum gauze;Other (comment)  (hydrocortisone cream)     Follow-up July 16, 2023-patient with bilateral venous stasis edema and dermatitis and open ulcerations but patient with dementia and difficult social situation lives  by herself, is on the verge of being discharged from home health due to noncompliance.    Review of Systems   Constitutional:         See HPI patient declines any debridement   All other systems reviewed and are negative.      Objective:     Blood pressure (!) 164/85, pulse (!) 108, temperature 97.7 F (36.5 C), temperature source Temporal, resp. rate 18.    Temp (24hrs), Avg:97.7 F (36.5 C), Min:97.7 F (36.5 C), Max:97.7 F (36.5 C)      Physical Exam  Vitals and nursing note reviewed. Exam conducted with a chaperone present.   Constitutional:       Comments: Patient with confusion and  poor verbalization consistent with dementia   HENT:      Head: Normocephalic and atraumatic.      Mouth/Throat:      Pharynx: Oropharynx is clear.   Cardiovascular:      Rate and Rhythm: Normal rate.   Pulmonary:      Effort: Pulmonary effort is normal.   Skin:     General: Skin is warm.   Neurological:      Mental Status: She is alert.   Psychiatric:         Mood and Affect: Mood normal.      Comments: I question patient's behavior and judgment     Patient with bilateral lower extremity edema 1-2+, bilateral diffuse skin macerations and venous stasis ulcerations with fat layer exposed but no infection.  Patient does need superficial slough debridement but she adamantly declines any of this due to pain      07/16/23 0922   Right Leg Edema Point of Measurement   Leg circumference 43.5 cm   Ankle circumference 23.5 cm   Compression Therapy 2 layer compression wrap   Left Leg Edema Point of Measurement   Leg circumference 46 cm   Ankle circumference 24.5 cm   Compression Therapy 2 layer compression wrap   Peripheral Vascular   RLE Edema Pitting;+3   LLE Edema Pitting;+3   RLE Neurovascular Assessment   Capillary Refill Less than/Equal to 3 seconds   Color Yellow-Brown/Hemosiderin Staining   Temperature Warm   R Pedal Pulse +2   LLE Neurovascular Assessment   Capillary Refill Less than/Equal to 3 seconds   Color Yellow-Brown/Hemosiderin Staining   Temperature Warm   LLE Sensation  Full sensation   L Pedal Pulse +2   Wound 06/18/23 Leg Right Circumferential   Date First Assessed/Time First Assessed: 06/18/23 0900   Present on Original Admission: Yes  Wound Approximate Age at First Assessment (Weeks): 4 weeks  Primary Wound Type: Venous Ulcer  Location: Leg  Wound Location Orientation: Right  Wound Descript...   Wound Image                            Wound Etiology Venous   Dressing Status Old drainage noted   Wound Cleansed Soap and water   Offloading for Diabetic Foot Ulcers Offloading not required   Wound Length (cm)  13 cm   Wound Width (cm) 20 cm   Wound Depth (cm) 0.1 cm   Wound Surface Area (cm^2) 260 cm^2   Change in Wound Size % (l*w) 32.9   Wound Volume (cm^3) 26 cm^3   Wound Healing % 66   Wound Assessment Pink/red;Slough   Drainage Amount Copious (>75 % saturated)   Drainage Description Serosanguinous  Odor None   Peri-wound Assessment Hemosiderin staining (brown yellow);Blanchable erythema;Maceration   Margins Undefined edges   Wound Thickness Description not for Pressure Injury Full thickness   Wound 06/18/23 Leg Left Circumferential   Date First Assessed/Time First Assessed: 06/18/23 0902   Present on Original Admission: Yes  Wound Approximate Age at First Assessment (Weeks): 4 weeks  Primary Wound Type: Venous Ulcer  Location: Leg  Wound Location Orientation: Left  Wound Descripti...   Wound Etiology Venous   Dressing Status Old drainage noted   Wound Cleansed Soap and water   Wound Length (cm) 12.5 cm   Wound Width (cm) 26.5 cm   Wound Depth (cm) 0.2 cm   Wound Surface Area (cm^2) 331.25 cm^2   Change in Wound Size % (l*w) -140.73   Wound Volume (cm^3) 66.25 cm^3   Wound Healing % -381   Wound Assessment Pink/red;Slough   Drainage Amount Copious (>75 % saturated)   Drainage Description Serosanguinous   Odor None   Peri-wound Assessment Hemosiderin staining (brown yellow);Maceration   Margins Undefined edges   Wound Thickness Description not for Pressure Injury Full thickness   Pain Assessment   Pain Assessment 0-10   Pain Level 8   Patient's Stated Pain Goal 0 - No pain   Pain Location Leg   Pain Orientation Right;Left   Pain Descriptors Aching;Burning      BP (!) 164/85   Pulse (!) 108   Temp 97.7 F (36.5 C) (Temporal)   Resp 18                       Labs: No results found for this or any previous visit (from the past 24 hour(s)).    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 *      Plan/Recommendations/Medical Decision Making:   I lengthy discussion with patient and advised her the need for debridement but she declines due to discomfort.  I explained to her the need for compliance, offloading, declining debridement risks, and also encourage patient to talk with PCP and family about living situation and different social situation where she could live with someone or in a monitored environment but patient is discounting all of these recommendations.      Continue wound care as below.  07/09/23 1024    Right Leg Edema Point of Measurement   Compression Therapy 2 layer compression wrap   Left Leg Edema Point of Measurement   Compression Therapy 2 layer compression wrap   Wound 06/18/23 Leg Right Circumferential   Date First Assessed/Time First Assessed: 06/18/23 0900   Present on Original Admission: Yes  Wound Approximate Age at First Assessment (Weeks): 4 weeks  Primary Wound Type: Venous Ulcer  Location: Leg  Wound Location Orientation: Right  Wound Descript...   Dressing Status New dressing applied   Dressing/Treatment Petroleum gauze;Other (comment);ABD  (hydrocortisone cream)   Wound 06/18/23 Leg Left Circumferential   Date First Assessed/Time  First Assessed: 06/18/23 0902   Present on Original Admission: Yes  Wound Approximate Age at First Assessment (Weeks): 4 weeks  Primary Wound Type: Venous Ulcer  Location: Leg  Wound Location Orientation: Left  Wound Descripti...   Dressing Status New dressing applied   Dressing/Treatment ABD;Petroleum gauze;Other (comment)  (hydrocortisone cream)   Follow-up 1 week    FACE TO FACE time including  review of any indicated imaging, discussion with patient, and other providers, exam and discussion with patient:    13         Minutes    Polo Riley  MD , [facility]  [facility]

## 2023-07-16 NOTE — Other (Signed)
07/16/23 0922   Right Leg Edema Point of Measurement   Leg circumference 43.5 cm   Ankle circumference 23.5 cm   Compression Therapy 2 layer compression wrap   Left Leg Edema Point of Measurement   Leg circumference 46 cm   Ankle circumference 24.5 cm   Compression Therapy 2 layer compression wrap   Peripheral Vascular   RLE Edema Pitting;+3   LLE Edema Pitting;+3   RLE Neurovascular Assessment   Capillary Refill Less than/Equal to 3 seconds   Color Yellow-Brown/Hemosiderin Staining   Temperature Warm   R Pedal Pulse +2   LLE Neurovascular Assessment   Capillary Refill Less than/Equal to 3 seconds   Color Yellow-Brown/Hemosiderin Staining   Temperature Warm   LLE Sensation  Full sensation   L Pedal Pulse +2   Wound 06/18/23 Leg Right Circumferential   Date First Assessed/Time First Assessed: 06/18/23 0900   Present on Original Admission: Yes  Wound Approximate Age at First Assessment (Weeks): 4 weeks  Primary Wound Type: Venous Ulcer  Location: Leg  Wound Location Orientation: Right  Wound Descript...   Wound Image      Wound Etiology Venous   Dressing Status Old drainage noted   Wound Cleansed Soap and water   Offloading for Diabetic Foot Ulcers Offloading not required   Wound Length (cm) 13 cm   Wound Width (cm) 20 cm   Wound Depth (cm) 0.1 cm   Wound Surface Area (cm^2) 260 cm^2   Change in Wound Size % (l*w) 32.9   Wound Volume (cm^3) 26 cm^3   Wound Healing % 66   Wound Assessment Pink/red;Slough   Drainage Amount Copious (>75 % saturated)   Drainage Description Serosanguinous   Odor None   Peri-wound Assessment Hemosiderin staining (brown yellow);Blanchable erythema;Maceration   Margins Undefined edges   Wound Thickness Description not for Pressure Injury Full thickness   Wound 06/18/23 Leg Left Circumferential   Date First Assessed/Time First Assessed: 06/18/23 0902   Present on Original Admission: Yes  Wound Approximate Age at First Assessment (Weeks): 4 weeks  Primary Wound Type: Venous Ulcer  Location:  Leg  Wound Location Orientation: Left  Wound Descripti...   Wound Etiology Venous   Dressing Status Old drainage noted   Wound Cleansed Soap and water   Wound Length (cm) 12.5 cm   Wound Width (cm) 26.5 cm   Wound Depth (cm) 0.2 cm   Wound Surface Area (cm^2) 331.25 cm^2   Change in Wound Size % (l*w) -140.73   Wound Volume (cm^3) 66.25 cm^3   Wound Healing % -381   Wound Assessment Pink/red;Slough   Drainage Amount Copious (>75 % saturated)   Drainage Description Serosanguinous   Odor None   Peri-wound Assessment Hemosiderin staining (brown yellow);Maceration   Margins Undefined edges   Wound Thickness Description not for Pressure Injury Full thickness   Pain Assessment   Pain Assessment 0-10   Pain Level 8   Patient's Stated Pain Goal 0 - No pain   Pain Location Leg   Pain Orientation Right;Left   Pain Descriptors Aching;Burning     BP (!) 164/85   Pulse (!) 108   Temp 97.7 F (36.5 C) (Temporal)   Resp 18

## 2023-07-16 NOTE — Other (Signed)
07/16/23 1024   Right Leg Edema Point of Measurement   Compression Therapy 2 layer compression wrap   Left Leg Edema Point of Measurement   Compression Therapy 2 layer compression wrap   Wound 06/18/23 Leg Right Circumferential   Date First Assessed/Time First Assessed: 06/18/23 0900   Present on Original Admission: Yes  Wound Approximate Age at First Assessment (Weeks): 4 weeks  Primary Wound Type: Venous Ulcer  Location: Leg  Wound Location Orientation: Right  Wound Descript...   Dressing Status New dressing applied   Dressing/Treatment Petroleum gauze;Other (comment);ABD  (hydrocortisone cream; superabsorber)   Wound 06/18/23 Leg Left Circumferential   Date First Assessed/Time First Assessed: 06/18/23 0902   Present on Original Admission: Yes  Wound Approximate Age at First Assessment (Weeks): 4 weeks  Primary Wound Type: Venous Ulcer  Location: Leg  Wound Location Orientation: Left  Wound Descripti...   Dressing Status New dressing applied   Dressing/Treatment ABD;Petroleum gauze;Other (comment)  (hydrocortisone cream; superabsorber)     Multilayer Compression Wrap   (Not Unna) Below the Knee    NAME:  Jamie Bryant  DATE OF BIRTH:  [birth year]  MEDICAL RECORD NUMBER:  [id number]  DATE:  July 16, 2023    Removed old dressing; wash with soap and water, Applied primary and secondary dressing as ordered, Placed compression to right lower leg, Place compression to left lower leg, Instructed patient/caregiver not to remove dressing and to keep it clean and dry, Instructed patient/caregiver on complications to report to provider, such as pain, numbness in toes, heavy drainage, and slippage of dressing, and Instructed patient/caregiver on purpose of compression dressing and on activity and exercise recommendations    Response to treatment: Well tolerated by patient      Electronically signed by Nolon Lennert, RN on 07/16/2023 at 10:25 AM

## 2023-07-16 NOTE — Patient Instructions (Addendum)
Discharge Instructions for  [facility]  [address]  [place], [place] [address]  Phone: [phone] Fax: [phone]    NAME:  Jamie Bryant  DATE OF BIRTH:  1941-12-21  MEDICAL RECORD NUMBER:  [id number]  DATE:  July 16, 2023    Home [facility]: [facility]    WOUND CARE ORDERS:  Bilateral Leg wounds: Cleanse with baby shampoo. Rinse and pat dry. Apply 2.5% Hydrocortisone cream to bilateral legs.  Cover open areas with Vaseline gauze, top with ABD pads then superabsorber. Secure with 2- layer compression wrap.         Activity:  [x]  Elevate leg(s) above the level of the heart when sitting.  [x]  Avoid prolonged standing in one place.   [x]  Do no get dressing/wrap wet.       Dietary:  []  Diet as tolerated      [x]  Diabetic Diet            []  Increase Protein: examples (Meat, cheese, eggs, greek yogurt, fish, nuts)          []  Juven Therapeutic Nutrition Powder  []  Other:       Return Appointment:  [x]  Return Appointment: With Dr. Jacklynn Bue in 1 week.  []  Nurse Visit :     []  Ordered tests:      Electronically signed Loretha Brasil, RN on 07/16/2023 at 8:58 AM     Wound Care Center Information: Should you experience any significant changes in your wound(s) or have questions about your wound care, please contact the [facility] at [facility] - FRIDAY 8:00 am - 4:30.  If you need help with your wound outside these hours and cannot wait until we are again available, contact your PCP or go to the hospital emergency room.     PLEASE NOTE: IF YOU ARE UNABLE TO OBTAIN WOUND SUPPLIES, CONTINUE TO USE THE SUPPLIES YOU HAVE AVAILABLE UNTIL YOU ARE ABLE TO REACH [place]. IT IS MOST IMPORTANT TO KEEP THE WOUND COVERED AT ALL TIMES.     Physician Signature:_______________________    Date: ___________ Time:  ____________

## 2023-07-23 ENCOUNTER — Ambulatory Visit: Payer: MEDICARE | Attending: Radiation Oncology | Primary: Internal Medicine

## 2023-07-30 ENCOUNTER — Inpatient Hospital Stay: Admit: 2023-07-30 | Discharge: 2023-07-30 | Payer: MEDICARE | Attending: Radiation Oncology | Primary: Internal Medicine

## 2023-07-30 DIAGNOSIS — L97222 Non-pressure chronic ulcer of left calf with fat layer exposed: Secondary | ICD-10-CM

## 2023-07-30 DIAGNOSIS — I872 Venous insufficiency (chronic) (peripheral): Secondary | ICD-10-CM

## 2023-07-30 MED ORDER — GENTAMICIN SULFATE 0.1 % EX OINT
0.1 | Freq: Once | CUTANEOUS | Status: DC
Start: 2023-07-30 — End: 2023-07-31

## 2023-07-30 NOTE — Other (Signed)
07/30/23 0917   Right Leg Edema Point of Measurement   Leg circumference 43.5 cm   Ankle circumference 23 cm   Compression Therapy 2 layer compression wrap   Left Leg Edema Point of Measurement   Leg circumference 46 cm   Ankle circumference 24.5 cm   Compression Therapy 2 layer compression wrap   Peripheral Vascular   RLE Edema +2;Pitting   LLE Edema +2;Pitting   RLE Neurovascular Assessment   Capillary Refill Less than/Equal to 3 seconds   Color Yellow-Brown/Hemosiderin Staining   Temperature Warm   R Pedal Pulse +2   LLE Neurovascular Assessment   Capillary Refill Less than/Equal to 3 seconds   Color Yellow-Brown/Hemosiderin Staining   Temperature Warm   LLE Sensation  Full sensation   L Pedal Pulse +2   Wound 06/18/23 Leg Right Circumferential   Date First Assessed/Time First Assessed: 06/18/23 0900   Present on Original Admission: Yes  Wound Approximate Age at First Assessment (Weeks): 4 weeks  Primary Wound Type: Venous Ulcer  Location: Leg  Wound Location Orientation: Right  Wound Descript...   Wound Image    Wound Etiology Venous   Dressing Status Old drainage noted   Wound Cleansed Soap and water   Offloading for Diabetic Foot Ulcers Offloading not required   Wound Length (cm) 13 cm   Wound Width (cm) 21 cm   Wound Depth (cm) 0.1 cm   Wound Surface Area (cm^2) 273 cm^2   Change in Wound Size % (l*w) 29.55   Wound Volume (cm^3) 27.3 cm^3   Wound Healing % 65   Wound Assessment Pink/red;Slough   Drainage Amount Large (50-75% saturated)   Drainage Description Serosanguinous;Green   Odor Moderate   Peri-wound Assessment Hemosiderin staining (brown yellow)   Margins Undefined edges   Wound Thickness Description not for Pressure Injury Full thickness   Wound 06/18/23 Leg Left Circumferential   Date First Assessed/Time First Assessed: 06/18/23 0902   Present on Original Admission: Yes  Wound Approximate Age at First Assessment (Weeks): 4 weeks  Primary Wound Type: Venous Ulcer  Location: Leg  Wound Location  Orientation: Left  Wound Descripti...   Wound Image     Wound Etiology Venous   Dressing Status Old drainage noted   Wound Cleansed Soap and water   Offloading for Diabetic Foot Ulcers Offloading not required   Wound Length (cm) 9.5 cm   Wound Width (cm) 24 cm   Wound Depth (cm) 0.1 cm   Wound Surface Area (cm^2) 228 cm^2   Change in Wound Size % (l*w) -65.7   Wound Volume (cm^3) 22.8 cm^3   Wound Healing % -66   Wound Assessment Pink/red;Slough   Drainage Amount Copious (>75 % saturated)   Drainage Description Serosanguinous;Green   Odor Moderate   Peri-wound Assessment Hemosiderin staining (brown yellow)   Margins Undefined edges   Wound Thickness Description not for Pressure Injury Full thickness   Pain Assessment   Pain Assessment 0-10   Pain Level 8   Patient's Stated Pain Goal 0 - No pain   Pain Location Leg   Pain Orientation Right;Left   Pain Descriptors Aching;Burning   BP (!) 128/57   Pulse 79   Temp 97.7 F (36.5 C) (Temporal)   Resp 16

## 2023-07-30 NOTE — Patient Instructions (Addendum)
Discharge Instructions for  [facility]  [address]  [place], [place] [address]  Phone: [phone] Fax: [phone]    NAME:  Jamie Bryant  DATE OF BIRTH:  08-25-1941  MEDICAL RECORD NUMBER:  [id number]  DATE:  July 30, 2023    Home Health: [facility]    WOUND CARE ORDERS:  Bilateral Leg wounds: Cleanse with baby shampoo. Rinse and pat dry. Apply gentamicin ointment wound beds.Apply 2.5% hydrocortisone cream to intact skin. Cover open areas with Vaseline gauze, top with ABD pads then superabsorber. Secure with 2- layer compression wrap.         Activity:  [x]  Elevate leg(s) above the level of the heart when sitting.  [x]  Avoid prolonged standing in one place.   [x]  Do no get dressing/wrap wet.       Dietary:  []  Diet as tolerated      [x]  Diabetic Diet            []  Increase Protein: examples (Meat, cheese, eggs, greek yogurt, fish, nuts)          []  Juven Therapeutic Nutrition Powder  []  Other:       Return Appointment:  [x]  Return Appointment: With Dr. Jacklynn Bue in 2 weeks.  []  Nurse Visit :   []  Ordered tests:      Electronically signed Loretha Brasil, RN on 07/30/2023 at 9:07 AM     Wound Care Center Information: Should you experience any significant changes in your wound(s) or have questions about your wound care, please contact the [facility] at [facility] - FRIDAY 8:00 am - 4:30.  If you need help with your wound outside these hours and cannot wait until we are again available, contact your PCP or go to the hospital emergency room.     PLEASE NOTE: IF YOU ARE UNABLE TO OBTAIN WOUND SUPPLIES, CONTINUE TO USE THE SUPPLIES YOU HAVE AVAILABLE UNTIL YOU ARE ABLE TO REACH [place]. IT IS MOST IMPORTANT TO KEEP THE WOUND COVERED AT ALL TIMES.     Physician Signature:_______________________    Date: ___________ Time:  ____________

## 2023-07-30 NOTE — Care Coordination-Inpatient (Signed)
Ambulatory Care Coordination Note     07/30/2023 10:36 AM     Patient outreach attempt by this ACM today to perform care management follow up . ACM was unable to reach the patient by telephone today; left voice message requesting a return phone call to this ACM.     ACM: Cecil Cranker, RN     Care Summary Note: f/u after PCP apt    PCP/Specialist follow up:   Future Appointments         Provider Specialty Dept Phone    08/13/2023 9:15 AM Ernestina Penna, MD Wound Ostomy [phone]    09/14/2023 2:30 PM Vianne Bulls, MD Internal Medicine [phone]            Follow Up:   Plan for next ACM outreach in approximately 1 week to complete:  - goal progression  - education .

## 2023-07-30 NOTE — Other (Signed)
07/30/23 1036   Right Leg Edema Point of Measurement   Compression Therapy 2 layer compression wrap   Left Leg Edema Point of Measurement   Compression Therapy 2 layer compression wrap   Wound 06/18/23 Leg Right Circumferential   Date First Assessed/Time First Assessed: 06/18/23 0900   Present on Original Admission: Yes  Wound Approximate Age at First Assessment (Weeks): 4 weeks  Primary Wound Type: Venous Ulcer  Location: Leg  Wound Location Orientation: Right  Wound Descript...   Dressing Status New dressing applied   Dressing/Treatment Petroleum gauze;Other (comment);ABD  (gentamicin cream; hydrocortisone cream)   Wound 06/18/23 Leg Left Circumferential   Date First Assessed/Time First Assessed: 06/18/23 0902   Present on Original Admission: Yes  Wound Approximate Age at First Assessment (Weeks): 4 weeks  Primary Wound Type: Venous Ulcer  Location: Leg  Wound Location Orientation: Left  Wound Descripti...   Dressing Status New dressing applied   Dressing/Treatment ABD;Petroleum gauze;Other (comment)  (gentamicin cream;hydrocortisone cream)     Multilayer Compression Wrap   (Not Unna) Below the Knee    NAME:  Jamie Bryant  DATE OF BIRTH:  1941-04-03  MEDICAL RECORD NUMBER:  [id number]  DATE:  July 30, 2023    Removed old dressing; wash with soap and water, Applied primary and secondary dressing as ordered, Placed compression to right lower leg, Place compression to left lower leg, Instructed patient/caregiver not to remove dressing and to keep it clean and dry, Instructed patient/caregiver on complications to report to provider, such as pain, numbness in toes, heavy drainage, and slippage of dressing, and Instructed patient/caregiver on purpose of compression dressing and on activity and exercise recommendations    Response to treatment: Well tolerated by patient      Electronically signed by Nolon Lennert, RN on 07/30/2023 at 10:38 AM

## 2023-07-30 NOTE — Progress Notes (Signed)
[facility]   Wound Care and [facility] Therapy Center   Medical Staff Progress Note     Jamie Bryant  MEDICAL RECORD NUMBER:  [id number]  AGE: 82 y.o.   GENDER: female  DOB: June 26, 1941  EPISODE DATE:  07/30/2023    Chief complaint and reason for visit:     Chief Complaint   Patient presents for follow up of her recurrent bilateral lower leg venous ulcers     Follow-up     "Wound care"      Patient presenting for follow up evaluation of wound(s) per chief complaint.      Subjective and ROS:  Jamie Bryant,  a 82 yo female with history of DM 2; HTN, CVA CHF, Parkinson's, delusion, chronic asthma, alleged dementia, CRF and chronic venous insufficiency of the lower legs, returns to the [facility] at the request of her PCP, [name],  for evaluation of her recurrent bilateral lower leg venous ulcers.   Patient was seen back in March, 2024 for wound care to her lower legs with venous ulcers. She failed to return for follow up after 3 visits and wounds incompletely healed.       Most recently according to the patient she was hospitalized at a [facility] hospital for CHF and then transferred to a nursing facility ([facility]).   After discharge or during her medical care, she developed new bilateral lower leg venous ulcers spontaneously. She complains of pain but cannot quantitated; has heavy drainage without odor. Has not use any compression stockings of late.  Claims she has some Santyl at home that was ordered earlier this year but never used.       Has diabetes and unclear about her A1c.  Chart shows A1C of 6.3 on 06/09/23.  Denies smoking.  Patient states she is allergic to Lidocaine and decline to have it used today.     June 25, 2023-  On follow up, Jamie Bryant feels worst.  Complains of the itch and pain in both the lower leg. Patient has been taking off her compression stockings off and on, not liking it or just doing it. Comes in without any dressings today.  Has new blisters that are sensitive and  pruritic.  She has taken Benadryl.   Complains of lower legs painful.  Denies any fever.  Has no SOB or chest pains.     July 09, 2023- since last seen, Jamie Bryant  has had her dressing changed at the Wound Care clinic to help get her wounds cleaned and provide the proper compression she needs.  She had been taking off her compression stocking due to discomfort or confusion.  She is allergic to lidocaine and unable to mechanically remove the slough.  Use of hydrocortisone cream and vaseline gauze and ABD and Drawtex have helped with her wound.   She returns with less edematous lower legs and more content.    Follow-up July 16, 2023-patient with bilateral venous stasis edema and dermatitis and open ulcerations but patient with dementia and difficult social situation lives by herself, is on the verge of being discharged from home health due to noncompliance.     July 30, 2023-  On follow up, the patient continues to complain of pain, "15 of 10 ".  Nothing new on the social situation.  Still decline any ability to debride the wound.  Denies any fever or chills.      History of Wound Context: Per original history and physical on  this patient. 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  Edema is about 2+ but difficult to examine due to pain.  Slough present  within the multiple ulcers.  Copious drainage continues.  Surrounding skin is the background of hemosiderin stained  with mild erythema, maceration.  No significant  increase in warmth. No  change in her edema.  Very tender to touch, even with light wipe with a clean guaze.  Treatment - Debridement is recommended; However, patient declined as before and denied use of lidocaine which she states is allergic to.   Wound care - Options are limited due to patient's limited permission.  Her level of cognitive ability makes it hard to manage wound.  She has no family members living with her.  Will continue to have her cleanse with baby shampoo .  Will add topical gentamicin ointment to the wound beds. And hydrocortisone cream (2.5%)  to the intact skin, cover with vaseline gauze and top with ABD and superabsorber.  Secure with 2 layer compression wrap.   Follow up in 2 weeks  Mineful of diabetic diet  Delusion /confusion -may be contributing to her noncompliance.  Lives alone.       Other associated diagnoses or problems addressed:  none    Pertinent imaging reviewed including independent interpretation include:  None    Pertinent labs reviewed.   Medical records and review of external note (s) from other providers done as well.    New lab or imaging orders placed:  None     Prescription drug management: N/A     Discussion of management or test interpretation with  patient who comes alone .    Comorbid conditions affecting wound healing: As per PMH which was reviewed.    Risk of complications and/or mortality of patient management:  This patient has a minimal risk of morbidity and mortality from additional diagnostic testing or treatment. This is due to the above conditions affecting wound healing as well as patient and procedure risk factors. Education and discussion held with patient regarding these disease processes pertinent to wound(s).  Other pertinent decisions include: N/A .  The patient's diagnosis or treatment is  significantly limited by social determinants of health as noted by:  living condition and her confusion .    Wound 06/18/23 Leg Right Circumferential (Active)    Wound Image   07/30/23 0917   Wound Etiology Venous 07/30/23 0917   Dressing Status New dressing applied 07/30/23 1036   Wound Cleansed Soap and water 07/30/23 0917   Dressing/Treatment Petroleum gauze;Other (comment);ABD 07/30/23 1036   Offloading for Diabetic Foot Ulcers Offloading not required 07/30/23 0917   Wound Length (cm) 13 cm 07/30/23 0917   Wound Width (cm) 21 cm 07/30/23 0917   Wound Depth (cm) 0.1 cm 07/30/23 0917   Wound Surface Area (cm^2) 273 cm^2 07/30/23 0917   Change  in Wound Size % (l*w) 29.55 07/30/23 0917   Wound Volume (cm^3) 27.3 cm^3 07/30/23 0917   Wound Healing % 65 07/30/23 0917   Wound Assessment Pink/red;Slough 07/30/23 0917   Drainage Amount Large (50-75% saturated) 07/30/23 0917   Drainage Description Serosanguinous;Green 07/30/23 0917   Odor Moderate 07/30/23 0917   Peri-wound Assessment Hemosiderin staining (brown yellow) 07/30/23 0917   Margins Undefined edges 07/30/23 0917   Wound Thickness Description not for Pressure Injury Full thickness 07/30/23 0917   Number of days: 44       Wound 06/18/23 Leg Left Circumferential (Active)   Wound Image    07/30/23 0917   Wound Etiology Venous 07/30/23 0917   Dressing Status New dressing applied 07/30/23 1036   Wound Cleansed Soap and water 07/30/23 0917   Dressing/Treatment ABD;Petroleum gauze;Other (comment) 07/30/23 1036   Offloading for Diabetic Foot Ulcers Offloading not required 07/30/23 0917   Wound Length (cm) 9.5 cm 07/30/23 0917   Wound Width (cm) 24 cm 07/30/23 0917   Wound Depth (cm) 0.1 cm 07/30/23 0917   Wound Surface Area (cm^2) 228 cm^2 07/30/23 0917   Change in Wound Size % (l*w) -65.7 07/30/23 0917   Wound Volume (cm^3) 22.8 cm^3 07/30/23 0917   Wound Healing % -66 07/30/23 0917   Wound Assessment Pink/red;Slough 07/30/23 0917   Drainage Amount Copious (>75 % saturated) 07/30/23 0917   Drainage Description Serosanguinous;Green 07/30/23 0917   Odor Moderate 07/30/23 0917   Peri-wound Assessment Hemosiderin staining (brown  yellow) 07/30/23 0917   Margins Undefined edges 07/30/23 0917   Wound Thickness Description not for Pressure Injury Full thickness 07/30/23 0917   Number of days: 44          Procedures done during this encounter:   Debridement: Other: recommended but declined by patient      TIME: E/M Time spent with patient and/or patient care issues: [x]  15-20 min  []  21-30 min  []  31-44 min  []  45 min or more.   This is above the usual time needed to address patient's chief complaint today: [x]  Yes  []  No  This time includes physician non-face-to-face service time visit on the date of service such as  Preparing to see the patient (eg, review of tests)  Obtaining and/or reviewing separately obtained history  Performing a medically necessary appropriate examination and/or evaluation  Counseling and educating the patient/family/caregiver  Ordering medications, tests, or procedures  Referring and communicating with other health care professionals as needed  Documenting clinical information in the electronic or other health record  Independently interpreting results (not reported separately) and communicating results to the patient/family/caregiver  Care coordination (not reported separately)    Objective:    BP (!) 128/57   Pulse 79   Temp 97.7 F (36.5 C) (Temporal)   Resp 16   Wt Readings from Last 3 Encounters:   07/10/23 98 kg (216 lb 1.6 oz)   06/18/23 99.8 kg (220 lb)   06/09/23 99.9 kg (220 lb 4.8 oz)       PHYSICAL EXAM  General: Alert and in no acute distress. Elderly with some degree of confusion.  She is conversant and appears to answer questions appropriately.  Unclear how much she really understands.   Head: Normocephalic and atraumatic  Eyes: Extraocular eye movements intact, conjunctivae normal, and sclera anicteric  ENT: Hearing grossly normal bilaterally. Normal appearance  Respiratory:  no respiratory distress  Musculoskeletal: Baseline range of motion in joints. Nontender calves. No cyanosis. Edema  2+.  Bilateral lower leg edema.  Diffuse maceration with slough and erythema, copious drainage. See images.  Neurologic: Speech normal. At baseline without new focal deficits. Mental status- her unchanged level is at baseline    PAST MEDICAL HISTORY        Diagnosis Date    Arthritis     Asthma     Chronic renal disease, stage III (HCC) [284132] 10/13/2022    Depression     Diabetes (HCC)     Fibromyalgia     Fibromyalgia     Gastroparesis     Hyperlipemia     Hypertension     Neuropathy     Psychotic disorder (HCC)     Stroke (HCC)        PAST SURGICAL HISTORY    Past Surgical History:   Procedure Laterality Date    COLONOSCOPY      HYSTERECTOMY (CERVIX STATUS UNKNOWN)         FAMILY HISTORY    Family History   Problem Relation Age of Onset    Breast Cancer Maternal Grandmother     Cancer Father         lung ca    Hypertension Father     Diabetes Mother     Hypertension Mother        SOCIAL HISTORY    Social History     Tobacco Use    Smoking status: Never     Passive exposure: Never    Smokeless tobacco: Never   Vaping Use    Vaping Use: Never used   Substance Use Topics    Alcohol use: No    Drug use: No       ALLERGIES    Allergies   Allergen Reactions    Iodinated Contrast Media Shortness Of Breath    Statins Myalgia     Lovastatin, pravastatin, simvastatin, atorvastatin, zetia, livalo, lovaza    Adhesive Tape Rash    Ampicillin Rash    Gabapentin Nausea And Vomiting    Lidocaine Rash       MEDICATIONS    Current Outpatient Medications on File Prior to Encounter   Medication Sig Dispense Refill    omeprazole (PRILOSEC) 40 MG delayed release capsule Take 1 capsule by mouth every morning (before breakfast) 30 capsule 0    lisinopril (PRINIVIL;ZESTRIL) 40 MG tablet Take 1 tablet by mouth daily (Patient not taking: Reported on 06/18/2023)      pregabalin (LYRICA) 300 MG capsule Take 1 capsule by mouth 2 times daily for 30 days. Max Daily Amount: 600 mg 60 capsule 0    quinapril (ACCUPRIL) 40 MG tablet Take 1 tablet  by mouth daily (Patient not taking: Reported on 04/20/2023)  0    torsemide (DEMADEX) 20 MG tablet Take 5 tablets by mouth daily (Patient not taking: Reported on 07/16/2023)      linaclotide (LINZESS) 290 MCG CAPS capsule Take 1 capsule by mouth every morning (before breakfast) 30 capsule     beclomethasone (QVAR REDIHALER) 40 MCG/ACT AERB inhaler Inhale 1 puff into the lungs in the morning and 1 puff in the evening. (Patient not taking: Reported on 07/09/2023)      amLODIPine (NORVASC) 5 MG tablet Take 1 tablet by mouth daily (Patient not taking: Reported on 04/20/2023)  0    PARoxetine (PAXIL) 10 MG tablet Take 1 tablet by mouth daily (Patient not taking: Reported on 06/18/2023)  0    albuterol-ipratropium (COMBIVENT RESPIMAT) 20-100 MCG/ACT AERS inhaler Inhale 1  puff into the lungs every 6 hours (Patient not taking: Reported on 07/16/2023)      insulin glargine (LANTUS) 100 UNIT/ML injection vial Inject 14 Units into the skin nightly (Patient not taking: Reported on 06/18/2023) 10 mL 3    insulin lispro (HUMALOG) 100 UNIT/ML SOLN injection vial Inject 0-4 Units into the skin 4 times daily (before meals and nightly) (Patient not taking: Reported on 07/16/2023)      linezolid (ZYVOX) 600 MG tablet Take 1 tablet by mouth 2 times daily Bid FOR 14 DAYS (Patient not taking: Reported on 03/05/2023)      pregabalin (LYRICA) 300 MG capsule Take 1 capsule by mouth 2 times daily for 180 days. Max Daily Amount: 600 mg 180 capsule 1    arformoterol tartrate 15 MCG/2ML NEBU 15 mcg, budesonide 0.25 MG/2ML SUSP 250 mcg Take 1 Dose by nebulization in the morning and 1 Dose in the evening. (Patient not taking: Reported on 12/18/2022) 60 Units 11    albuterol sulfate HFA (VENTOLIN HFA) 108 (90 Base) MCG/ACT inhaler Inhale 2 puffs into the lungs 4 times daily as needed for Wheezing (Patient not taking: Reported on 12/18/2022) 54 g 1    albuterol (PROVENTIL) (2.5 MG/3ML) 0.083% nebulizer solution Take 3 mLs by nebulization 4 times daily  (Patient not taking: Reported on 04/20/2023) 120 each 3    QUEtiapine (SEROQUEL) 25 MG tablet Take 0.5 tablets by mouth nightly (Patient not taking: Reported on 02/16/2023) 60 tablet 3    arformoterol tartrate (BROVANA) 15 MCG/2ML NEBU Take 1 ampule by nebulization 2 times daily (Patient not taking: Reported on 12/18/2022) 120 mL 3    budesonide (PULMICORT) 0.25 MG/2ML nebulizer suspension Take 2 mLs by nebulization 2 times daily (Patient not taking: Reported on 12/18/2022) 60 each 3    albuterol sulfate HFA (VENTOLIN HFA) 108 (90 Base) MCG/ACT inhaler Inhale 2 puffs into the lungs 4 times daily as needed for Wheezing (Patient not taking: Reported on 04/20/2023) 18 g 0    carvedilol (COREG) 12.5 MG tablet Take 1 tablet by mouth 2 times daily      Multiple Vitamins-Minerals (THERAPEUTIC MULTIVITAMIN-MINERALS) tablet Take 1 tablet by mouth daily      aspirin 81 MG chewable tablet Take 1 tablet by mouth daily 30 tablet 11    vitamin D3 (CHOLECALCIFEROL) 125 MCG (5000 UT) TABS tablet Take 1 tablet by mouth daily 30 tablet 11    SITagliptin (JANUVIA) 100 MG tablet Take 1 tablet by mouth daily 90 tablet 3    quinapril (ACCUPRIL) 40 MG tablet quinapril 40 mg tablet   TAKE 1 TABLET BY MOUTH EVERY NIGHT FOR HIGH BLOOD PRESSURE (Patient not taking: Reported on 12/19/2022) 90 tablet 3    REPATHA SURECLICK 140 MG/ML SOAJ INJECT AS DIRECTED EVERY 2 WEEKS 2 Adjustable Dose Pre-filled Pen Syringe 11    insulin lispro, 1 Unit Dial, (HUMALOG/ADMELOG) 100 UNIT/ML SOPN Humalog KwikPen (U-100) Insulin 100 unit/mL subcutaneous   INJECT UP TO 50 UNITS UNDER THE SKIN DAILY AS DIRECTED (Patient not taking: Reported on 04/20/2023)      ipratropium (ATROVENT HFA) 17 MCG/ACT inhaler Atrovent HFA 17 mcg/actuation aerosol inhaler (Patient not taking: Reported on 02/16/2023)      linaclotide (LINZESS) 290 MCG CAPS capsule Take 1 capsule by mouth as needed (Patient not taking: Reported on 06/09/2023)      olopatadine (PATANOL) 0.1 % ophthalmic solution  Apply 2 drops to eye 2 times daily (Patient not taking: Reported on 04/20/2023)  No current facility-administered medications on file prior to encounter.         Written patient dismissal instructions given to patient and signed by patient or POA.         Electronically signed by Ernestina Penna, MD on 08/01/2023 at 4:53 PM

## 2023-08-04 MED ORDER — REPATHA SURECLICK 140 MG/ML SC SOAJ
140 MG/ML | SUBCUTANEOUS | 11 refills | Status: AC
Start: 2023-08-04 — End: ?

## 2023-08-06 NOTE — Care Coordination-Inpatient (Signed)
Ambulatory Care Coordination Note     08/06/2023 11:55 AM     Patient Current Location:  [place]     Patient contacted the patient by telephone. Verified name and DOB with patient as identifiers.         ACM: Cecil Cranker, RN     Challenges to be reviewed by the provider   Additional needs identified to be addressed with provider No  none               Method of communication with provider: none.    Care Summary Note: Pt doing well and states that her wounds are healing and that she has lost 6 lbs. ACM encouraged pt to continue following medication regimen, adhering to no concentrated sweets diet and checking her weight and BP daily. Pt verbalized understanding and states that she will continue to do these things.     Offered patient enrollment in the Remote Patient Monitoring (RPM) program for in-home monitoring: Patient is not eligible for RPM program because: monitored by cardiology .     Assessments Completed:   No changes since last call    Medications Reviewed:   Completed during a previous call     Advance Care Planning:   Reviewed and current     Care Planning:   Education Documentation  Exercise, taught by Cecil Cranker, RN at 08/06/2023 11:51 AM.  Learner: Patient  Readiness: Acceptance  Method: Explanation  Response: Needs Reinforcement    Diet, taught by Cecil Cranker, RN at 08/06/2023 11:51 AM.  Learner: Patient  Readiness: Acceptance  Method: Explanation  Response: Needs Reinforcement    HgbA1c, taught by Cecil Cranker, RN at 08/06/2023 11:51 AM.  Learner: Patient  Readiness: Acceptance  Method: Explanation  Response: Needs Reinforcement    Education Comments  No comments found.     ,    Goals Addressed                      This Visit's Progress      Conditions and Symptoms   On track      I will schedule office visits, as directed by my provider.  I will keep my appointment or reschedule if I have to cancel.  I will notify my provider of any barriers to my plan of care.  I will notify my provider of any  symptoms that indicate a worsening of my condition.    Barriers: overwhelmed by complexity of regimen and lack of education  Plan for overcoming my barriers: ACM will provide support, education, and coordination of resources that may contribute to improvement of patient's condition.   Confidence: 7/10  Anticipated Goal Completion Date: 08/26/23    08/06/23  Pt reports her wounds are doing better and healing slowly but surely. Pt still has pain and the skin is itchy but she is tolerating it well. Pt has wound care and sees Dr. Claudie Fisherman. EA  06/25/23   Pt reports wound care is going well but slow to heal. She reports she has a lot of pain and she can barely walk because of the pain. She reports the wounds are draining a lot. Cousin provides transportion to apts. EA  05/26/23  Reviewed pt upcoming appts and pt reports she has transportation to these appts and is able to keep them. EA          Patient Stated (pt-stated)   On track      Pt is trying to lose weight  to help reduce pain in her legs and increase strength.     Barriers: lack of support and lack of education  Plan for overcoming my barriers: ACM will provide support, education, and coordination of resources that may contribute to improvement of patient's condition.   Confidence: 8/10  Anticipated Goal Completion Date: 09/25/23   08/06/23  Pt reports he weight is 210 lb. That is a 6 lb weight loss since last encounter. A1C is 6.3 which is down from 6.5. Pts wounds are healing and her condition is improving overall. Pt did not state that she got in touch with dietician. Will f/u on this at next encounter. EA  06/25/23  Discussed pts goals with and ACM provided nutrition education for pt. ACM will mail weight loss information to patient and provided dietician phone number for Alfredia Client, RD for pt to call [phone].  Will f/u on this at next encounter. EA               PCP/Specialist follow up:   Future Appointments         Provider Specialty Dept Phone    08/13/2023 9:15  AM Ernestina Penna, MD Wound Ostomy [phone]    09/14/2023 2:30 PM Vianne Bulls, MD Internal Medicine [phone]            Follow Up:   Plan for next ACM outreach in approximately 3 weeks to complete:  - goal progression  - education .   Patient  is agreeable to this plan.

## 2023-08-07 MED ORDER — JANUVIA 100 MG PO TABS
100 | ORAL_TABLET | Freq: Every day | ORAL | 3 refills | Status: AC
Start: 2023-08-07 — End: ?

## 2023-08-13 ENCOUNTER — Inpatient Hospital Stay: Admit: 2023-08-13 | Discharge: 2023-08-13 | Payer: MEDICARE | Attending: Radiation Oncology | Primary: Internal Medicine

## 2023-08-13 DIAGNOSIS — I872 Venous insufficiency (chronic) (peripheral): Secondary | ICD-10-CM

## 2023-08-13 MED ORDER — GENTAMICIN SULFATE 0.1 % EX OINT
0.1 | CUTANEOUS | 1 refills | Status: AC
Start: 2023-08-13 — End: 2023-08-20

## 2023-08-13 MED ORDER — GENTAMICIN SULFATE 0.1 % EX OINT
0.1 | Freq: Once | CUTANEOUS | Status: DC
Start: 2023-08-13 — End: 2023-08-14

## 2023-08-13 MED ORDER — HYDROCORTISONE 2.5 % EX CREA
2.5 | CUTANEOUS | 1 refills | Status: AC
Start: 2023-08-13 — End: ?

## 2023-08-13 NOTE — Other (Signed)
08/13/23 1022   Right Leg Edema Point of Measurement   Compression Therapy 2 layer compression wrap   Left Leg Edema Point of Measurement   Compression Therapy 2 layer compression wrap   Wound 06/18/23 Leg Right Circumferential   Date First Assessed/Time First Assessed: 06/18/23 0900   Present on Original Admission: Yes  Wound Approximate Age at First Assessment (Weeks): 4 weeks  Primary Wound Type: Venous Ulcer  Location: Leg  Wound Location Orientation: Right  Wound Descript...   Dressing Status New dressing applied   Dressing/Treatment Petroleum gauze;Other (comment);ABD  (super absorber; gentamicin; hydrocortisone)   Offloading for Diabetic Foot Ulcers Offloading not required   Wound 06/18/23 Leg Left Circumferential   Date First Assessed/Time First Assessed: 06/18/23 0902   Present on Original Admission: Yes  Wound Approximate Age at First Assessment (Weeks): 4 weeks  Primary Wound Type: Venous Ulcer  Location: Leg  Wound Location Orientation: Left  Wound Descripti...   Dressing Status New dressing applied   Dressing/Treatment ABD;Petroleum gauze;Other (comment)  (suberabsorber; gentamicin; hydrocortisone)     Multilayer Compression Wrap   (Not Unna) Below the Knee    NAME:  Jamie Bryant  DATE OF BIRTH:  01/06/1941  MEDICAL RECORD NUMBER:  [id number]  DATE:  August 13, 2023    Removed old dressing; wash with soap and water, Applied primary and secondary dressing as ordered, Placed compression to right lower leg, Place compression to left lower leg, Instructed patient/caregiver not to remove dressing and to keep it clean and dry, Instructed patient/caregiver on complications to report to provider, such as pain, numbness in toes, heavy drainage, and slippage of dressing, and Instructed patient/caregiver on purpose of compression dressing and on activity and exercise recommendations    Response to treatment: Well tolerated by patient      Electronically signed by Nolon Lennert, RN on 08/13/2023 at 10:24 AM

## 2023-08-13 NOTE — Progress Notes (Signed)
[facility]   Wound Care and [facility] Center   Medical Staff Progress Note     Jamie Bryant  MEDICAL RECORD NUMBER:  [id number]  AGE: 82 y.o.   GENDER: female  DOB: 03-16-1941  EPISODE DATE:  08/13/2023    Chief complaint and reason for visit:     Chief Complaint   Jamie Bryant presents for follow up of her recurrent bilateral lower leg venous ulcers     wound      "Wound care"      Jamie Bryant presenting for follow up evaluation of wound(s) per chief complaint.      Subjective and ROS:    Jamie Bryant,  a 82 yo female with history of DM 2; HTN, CVA CHF, Parkinson's, delusion, chronic asthma, alleged dementia, CRF and chronic venous insufficiency of the lower legs, returns to the [facility] at the request of her PCP, [name],  for evaluation of her recurrent bilateral lower leg venous ulcers.   Jamie Bryant was seen back in March, 2024 for wound care to her lower legs with venous ulcers. She failed to return for follow up after 3 visits and wounds incompletely healed.       Most recently according to the Jamie Bryant she was hospitalized at a [facility] for CHF and then transferred to a nursing facility ([facility]).   After discharge or during her medical care, she developed new bilateral lower leg venous ulcers spontaneously. She complains of pain but cannot quantitated; has heavy drainage without odor. Has not use any compression stockings of late.  Claims she has some Santyl at home that was ordered earlier this year but never used.       Has diabetes and unclear about her A1c.  Chart shows A1C of 6.3 on 06/09/23.  Denies smoking.  Jamie Bryant states she is allergic to Lidocaine and decline to have it used today.     June 25, 2023-  On follow up, Jamie Bryant feels worst.  Complains of the itch and pain in both the lower leg. Jamie Bryant has been taking off her compression stockings off and on, not liking it or just doing it. Comes in without any dressings today.  Has new blisters that are sensitive and  pruritic.  She has taken Benadryl.   Complains of lower legs painful.  Denies any fever.  Has no SOB or chest pains.     July 09, 2023- since last seen, Jamie Bryant  has had her dressing changed at the Wound Care clinic to help get her wounds cleaned and provide the proper compression she needs.  She had been taking off her compression stocking due to discomfort or confusion.  She is allergic to lidocaine and unable to mechanically remove the slough.  Use of hydrocortisone cream and vaseline gauze and ABD and Drawtex have helped with her wound.   She returns with less edematous lower legs and more content.     Follow-up July 16, 2023-Jamie Bryant with bilateral venous stasis edema and dermatitis and open ulcerations but Jamie Bryant with dementia and difficult social situation lives by herself, is on the verge of being discharged from home health due to noncompliance.     July 30, 2023-  On follow up, the Jamie Bryant continues to complain of pain, "15 of 10 ".  Nothing new on the social situation.  Still decline any ability to debride the wound.  Denies any fever or chills.      August 13, 2023-  On follow  up, Jamie Bryant continues to complain of pain, "15 of 10" everyday all day.  She is having more drainage with odor; has been having her wraps changed 2 x a week. Did not get gentamicin  topical ointments or  hydrocortisone cream, stating not called in. Admits to scratching due to itch.  She denies any fever or chills.  She states she is not sure what to do. Talks about "dead" people in her apartment.  She is looking for another place to live now. Has a son in town. And a daughter in [place]?    History of Wound Context: Per original history and physical on this Jamie Bryant. Changes in history since last evaluation: none    Medical Decision Making:     Problem List Items Addressed This Visit          Circulatory    Venous insufficiency    Idiopathic chronic venous hypertension of right lower extremity with ulcer (HCC)       Other     Severe obesity (BMI 35.0-39.9) with comorbidity (HCC)    Venous stasis ulcer of left calf with fat layer exposed without varicose veins (HCC) - Primary       Wounds and Treatment Plan:  Bilateral lower legs - wounds are worst with maceration, covered with slough and copious odorous drainage.  No bleeding. No increased erythema or warmth.  Bilateral edema -2+. Scratch marks in the upper part of the area affected by the diffuse wound. Tender to touch but wants to know what to do.  Explained slowly to the Jamie Bryant the need for debridement with the slough piling up. She consented to the debridement which was performed very gingerly.    Treatment - Debridement is recommended.  Jamie Bryant agreed.  Claims to be allergic to lidocaine.  With a 5 mm curette, the thick layers of slough and biofilm were gently scraped off the wounds.  She she complained of pain, the procedure stopped.  She actually did well although not all the slough could be removed.  She declined  various products as used in the past, claiming she did not like them due to pain, discomfort or allergic to them.  Wound was cleaned with saline solution.    Wound care - Options are limited due to Jamie Bryant's limited permission.  Her level of cognitive ability makes it hard to manage wound.  She has no family members living with her.  Will continue to have her cleanse with baby shampoo .  Will reorder the  topical gentamicin ointment to the wound beds. And hydrocortisone cream (2.5%)  to the intact skin, cover with vaseline gauze and top with ABD and superabsorber.  Secure with 2 layer compression wrap. She likes and consents to this arrangement  Jamie Bryant advised not to scratch as she can make wound worst.  Follow up in 2 weeks  Mineful of diabetic diet  Delusion /confusion -a  contributing factor to her slow recovery       Other associated diagnoses or problems addressed:   none    Pertinent imaging reviewed including independent interpretation  include:  None    Pertinent labs reviewed.   Medical records and review of external note (s) from other providers done as well.    New lab or imaging orders placed:  None     Prescription drug management: gentamicin topical ointment 0.1%; and hydrocortisone cream 2.5%  yo apply topically to the leg wounds    Discussion of management or test interpretation with  Jamie Bryant who comes alone. Has no family members to accompany her .    Comorbid conditions affecting wound healing: As per PMH which was reviewed.    Risk of complications and/or mortality of Jamie Bryant management:  This Jamie Bryant has a low risk of morbidity and mortality from additional diagnostic testing or treatment. This is due to the above conditions affecting wound healing as well as Jamie Bryant and procedure risk factors. Education and discussion held with Jamie Bryant regarding these disease processes pertinent to wound(s).  Other pertinent decisions include: minor surgery or procedures as below.  The Jamie Bryant's diagnosis or treatment is  significantly limited by social determinants of health as noted by:  mental status .    Wound 06/18/23 Leg Right Circumferential (Active)   Wound Image   07/30/23 0917   Wound Etiology Venous 08/13/23 0920   Dressing Status New dressing applied 08/13/23 1022   Wound Cleansed Soap and water 08/13/23 0920   Dressing/Treatment Petroleum gauze;Other (comment);ABD 08/13/23 1022   Offloading for Diabetic Foot Ulcers Offloading not required 08/13/23 1022   Wound Length (cm) 16.4 cm 08/13/23 0920   Wound Width (cm) 33 cm 08/13/23 0920   Wound Depth (cm) 0.1 cm 08/13/23 0920   Wound Surface Area (cm^2) 541.2 cm^2 08/13/23 0920   Change in Wound Size % (l*w) -39.66 08/13/23 0920   Wound Volume (cm^3) 54.12 cm^3 08/13/23 0920   Wound Healing % 30 08/13/23 0920   Wound Assessment Pink/red;Slough 08/13/23 0920   Drainage Amount Moderate (25-50%) 08/13/23 0920   Drainage Description Serosanguinous 08/13/23 0920   Odor Moderate 08/13/23 0920    Peri-wound Assessment Hemosiderin staining (brown yellow);Maceration 08/13/23 0920   Margins Undefined edges 08/13/23 0920   Wound Thickness Description not for Pressure Injury Full thickness 08/13/23 0920   Number of days: 57       Wound 06/18/23 Leg Left Circumferential (Active)   Wound Image    07/30/23 0917   Wound Etiology Venous 08/13/23 0920   Dressing Status New dressing applied 08/13/23 1022   Wound Cleansed Soap and water 08/13/23 0920   Dressing/Treatment ABD;Petroleum gauze;Other (comment) 08/13/23 1022   Offloading for Diabetic Foot Ulcers Offloading not required 08/13/23 0920   Wound Length (cm) 24 cm 08/13/23 0920   Wound Width (cm) 41 cm 08/13/23 0920   Wound Depth (cm) 0.1 cm 08/13/23 0920   Wound Surface Area (cm^2) 984 cm^2 08/13/23 0920   Change in Wound Size % (l*w) -615.12 08/13/23 0920   Wound Volume (cm^3) 98.4 cm^3 08/13/23 0920   Wound Healing % -615 08/13/23 0920   Wound Assessment Pink/red;Slough 08/13/23 0920   Drainage Amount Moderate (25-50%) 08/13/23 0920   Drainage Description Serosanguinous 08/13/23 0920   Odor Moderate 08/13/23 0920   Peri-wound Assessment Hemosiderin staining (brown yellow);Maceration 08/13/23 0920   Margins Undefined edges 08/13/23 0920   Wound Thickness Description not for Pressure Injury Full thickness 08/13/23 0920   Number of days: 57          Procedures done during this encounter:   Debridement: Excisional Debridement  Indications:  Based on my examination of this Jamie Bryant's wound(s)/ulcer(s) today, debridement is required to promote healing and evaluate the wound base. Risks and benefits discussed with Jamie Bryant who has agreed to proceed.   Performed by: Ernestina Penna, MD  Consent obtained:  Yes  Time out taken:  Yes  Pain Control:     Using curette the wound(s)/ulcer(s) was/were debrided down through and including the removal of epidermis, dermis, and  subcutaneous tissue.      Devitalized Tissue Debrided:  biofilm, slough, necrotic/eschar, and exudate  Pre  Debridement Measurements:  Are located in the Wound/Ulcer Documentation Flow Sheet  Wound/Ulcer #:  diffuse area of wound involvement , mostly on the left; mild on the right  Post Debridement Measurements:  Wound/Ulcer Descriptions are Pre Debridement except measurements:  Total Surface Area Debrided:  742 sq cm   Diabetic/Pressure/Non Pressure Ulcers only:  Ulcer: Non-Pressure ulcer, fat layer exposed   Estimated Blood Loss:  None  Hemostasis Achieved:  not needed  Procedural Pain:  15  / 10   Post Procedural Pain:  15 / 10   Response to treatment:  With complaints of pain. But actually tolerated     TIME: E/M Time spent with Jamie Bryant and/or Jamie Bryant care issues: []  15-20 min  [x]  21-30 min  []  31-44 min  []  45 min or more.   This is above the usual time needed to address Jamie Bryant's chief complaint today: [x]  Yes  []  No  This time includes physician non-face-to-face service time visit on the date of service such as  Preparing to see the Jamie Bryant (eg, review of tests)  Obtaining and/or reviewing separately obtained history  Performing a medically necessary appropriate examination and/or evaluation  Counseling and educating the Jamie Bryant/family/caregiver  Ordering medications, tests, or procedures  Referring and communicating with other health care professionals as needed  Documenting clinical information in the electronic or other health record  Independently interpreting results (not reported separately) and communicating results to the Jamie Bryant/family/caregiver  Care coordination (not reported separately)    Objective:    BP 137/71   Pulse 84   Temp 97.7 F (36.5 C) (Temporal)   Resp 18   Wt Readings from Last 3 Encounters:   07/10/23 98 kg (216 lb 1.6 oz)   06/18/23 99.8 kg (220 lb)   06/09/23 99.9 kg (220 lb 4.8 oz)       PHYSICAL EXAM  General: Alert and in no acute distress. Normal appearing Elderly female speaking with normal  conversation going in and out of her delusion  Skin: Warm and dry, no rash  Head:  Normocephalic and atraumatic  Eyes: Extraocular eye movements intact, conjunctivae normal, and sclera anicteric  ENT: Hearing grossly normal bilaterally. Normal appearance  Respiratory:. no respiratory distress  Musculoskeletal: Baseline range of motion in joints. Nontender calves. No cyanosis. Edema 2+.  Neurologic: Speech normal. At baseline without new focal deficits. Mental status - varies from her normal  to intermittent delusion. A&O x 4    PAST MEDICAL HISTORY        Diagnosis Date    Arthritis     Asthma     Chronic renal disease, stage III (HCC) [[id number]] 10/13/2022    Depression     Diabetes (HCC)     Fibromyalgia     Fibromyalgia     Gastroparesis     Hyperlipemia     Hypertension     Neuropathy     Psychotic disorder (HCC)     Stroke (HCC)        PAST SURGICAL HISTORY    Past Surgical History:   Procedure Laterality Date    COLONOSCOPY      HYSTERECTOMY (CERVIX STATUS UNKNOWN)         FAMILY HISTORY    Family History   Problem Relation Age of Onset    Breast Cancer Maternal Grandmother     Cancer Father  lung ca    Hypertension Father     Diabetes Mother     Hypertension Mother        SOCIAL HISTORY    Social History     Tobacco Use    Smoking status: Never     Passive exposure: Never    Smokeless tobacco: Never   Vaping Use    Vaping status: Never Used   Substance Use Topics    Alcohol use: No    Drug use: No       ALLERGIES    Allergies   Allergen Reactions    Iodinated Contrast Media Shortness Of Breath    Statins Myalgia     Lovastatin, pravastatin, simvastatin, atorvastatin, zetia, livalo, lovaza    Adhesive Tape Rash    Ampicillin Rash    Gabapentin Nausea And Vomiting    Lidocaine Rash       MEDICATIONS    Current Outpatient Medications on File Prior to Encounter   Medication Sig Dispense Refill    JANUVIA 100 MG tablet TAKE 1 TABLET DAILY 90 tablet 3    REPATHA SURECLICK 140 MG/ML SOAJ INJECT AS DIRECTED EVERY 2 WEEKS 2 mL 11    omeprazole (PRILOSEC) 40 MG delayed release capsule Take 1  capsule by mouth every morning (before breakfast) 30 capsule 0    linaclotide (LINZESS) 290 MCG CAPS capsule Take 1 capsule by mouth every morning (before breakfast) 30 capsule     carvedilol (COREG) 12.5 MG tablet Take 1 tablet by mouth 2 times daily      Multiple Vitamins-Minerals (THERAPEUTIC MULTIVITAMIN-MINERALS) tablet Take 1 tablet by mouth daily      aspirin 81 MG chewable tablet Take 1 tablet by mouth daily 30 tablet 11    vitamin D3 (CHOLECALCIFEROL) 125 MCG (5000 UT) TABS tablet Take 1 tablet by mouth daily 30 tablet 11    lisinopril (PRINIVIL;ZESTRIL) 40 MG tablet Take 1 tablet by mouth daily (Jamie Bryant not taking: Reported on 06/18/2023)      pregabalin (LYRICA) 300 MG capsule Take 1 capsule by mouth 2 times daily for 30 days. Max Daily Amount: 600 mg 60 capsule 0    quinapril (ACCUPRIL) 40 MG tablet Take 1 tablet by mouth daily (Jamie Bryant not taking: Reported on 04/20/2023)  0    torsemide (DEMADEX) 20 MG tablet Take 5 tablets by mouth daily (Jamie Bryant not taking: Reported on 07/16/2023)      beclomethasone (QVAR REDIHALER) 40 MCG/ACT AERB inhaler Inhale 1 puff into the lungs in the morning and 1 puff in the evening. (Jamie Bryant not taking: Reported on 07/09/2023)      amLODIPine (NORVASC) 5 MG tablet Take 1 tablet by mouth daily (Jamie Bryant not taking: Reported on 04/20/2023)  0    PARoxetine (PAXIL) 10 MG tablet Take 1 tablet by mouth daily (Jamie Bryant not taking: Reported on 06/18/2023)  0    albuterol-ipratropium (COMBIVENT RESPIMAT) 20-100 MCG/ACT AERS inhaler Inhale 1 puff into the lungs every 6 hours (Jamie Bryant not taking: Reported on 07/16/2023)      insulin glargine (LANTUS) 100 UNIT/ML injection vial Inject 14 Units into the skin nightly (Jamie Bryant not taking: Reported on 06/18/2023) 10 mL 3    insulin lispro (HUMALOG) 100 UNIT/ML SOLN injection vial Inject 0-4 Units into the skin 4 times daily (before meals and nightly) (Jamie Bryant not taking: Reported on 07/16/2023)      linezolid (ZYVOX) 600 MG tablet Take 1 tablet by  mouth 2 times daily Bid FOR 14 DAYS (Jamie Bryant not taking: Reported  on 03/05/2023)      pregabalin (LYRICA) 300 MG capsule Take 1 capsule by mouth 2 times daily for 180 days. Max Daily Amount: 600 mg 180 capsule 1    arformoterol tartrate 15 MCG/2ML NEBU 15 mcg, budesonide 0.25 MG/2ML SUSP 250 mcg Take 1 Dose by nebulization in the morning and 1 Dose in the evening. (Jamie Bryant not taking: Reported on 12/18/2022) 60 Units 11    albuterol sulfate HFA (VENTOLIN HFA) 108 (90 Base) MCG/ACT inhaler Inhale 2 puffs into the lungs 4 times daily as needed for Wheezing (Jamie Bryant not taking: Reported on 12/18/2022) 54 g 1    albuterol (PROVENTIL) (2.5 MG/3ML) 0.083% nebulizer solution Take 3 mLs by nebulization 4 times daily (Jamie Bryant not taking: Reported on 04/20/2023) 120 each 3    QUEtiapine (SEROQUEL) 25 MG tablet Take 0.5 tablets by mouth nightly (Jamie Bryant not taking: Reported on 02/16/2023) 60 tablet 3    arformoterol tartrate (BROVANA) 15 MCG/2ML NEBU Take 1 ampule by nebulization 2 times daily (Jamie Bryant not taking: Reported on 12/18/2022) 120 mL 3    budesonide (PULMICORT) 0.25 MG/2ML nebulizer suspension Take 2 mLs by nebulization 2 times daily (Jamie Bryant not taking: Reported on 12/18/2022) 60 each 3    albuterol sulfate HFA (VENTOLIN HFA) 108 (90 Base) MCG/ACT inhaler Inhale 2 puffs into the lungs 4 times daily as needed for Wheezing (Jamie Bryant not taking: Reported on 04/20/2023) 18 g 0    quinapril (ACCUPRIL) 40 MG tablet quinapril 40 mg tablet   TAKE 1 TABLET BY MOUTH EVERY NIGHT FOR HIGH BLOOD PRESSURE (Jamie Bryant not taking: Reported on 12/19/2022) 90 tablet 3    insulin lispro, 1 Unit Dial, (HUMALOG/ADMELOG) 100 UNIT/ML SOPN Humalog KwikPen (U-100) Insulin 100 unit/mL subcutaneous   INJECT UP TO 50 UNITS UNDER THE SKIN DAILY AS DIRECTED (Jamie Bryant not taking: Reported on 04/20/2023)      ipratropium (ATROVENT HFA) 17 MCG/ACT inhaler Atrovent HFA 17 mcg/actuation aerosol inhaler (Jamie Bryant not taking: Reported on 02/16/2023)       linaclotide (LINZESS) 290 MCG CAPS capsule Take 1 capsule by mouth as needed (Jamie Bryant not taking: Reported on 06/09/2023)      olopatadine (PATANOL) 0.1 % ophthalmic solution Apply 2 drops to eye 2 times daily (Jamie Bryant not taking: Reported on 04/20/2023)       No current facility-administered medications on file prior to encounter.         Written Jamie Bryant dismissal instructions given to Jamie Bryant and signed by Jamie Bryant or POA.         Electronically signed by Ernestina Penna, MD on 08/14/2023 at 9:02 AM

## 2023-08-13 NOTE — Other (Signed)
08/13/23 0920   Right Leg Edema Point of Measurement   Leg circumference 39 cm   Ankle circumference 23 cm   Compression Therapy 2 layer compression wrap   Left Leg Edema Point of Measurement   Leg circumference 44.5 cm   Ankle circumference 24 cm   Compression Therapy 2 layer compression wrap   Peripheral Vascular   RLE Edema +2;Pitting   LLE Edema +2;Pitting   RLE Neurovascular Assessment   Capillary Refill Less than/Equal to 3 seconds   Color Yellow-Brown/Hemosiderin Staining   Temperature Warm   R Pedal Pulse +2   LLE Neurovascular Assessment   Capillary Refill Less than/Equal to 3 seconds   Color Yellow-Brown/Hemosiderin Staining   Temperature Warm   LLE Sensation  Full sensation   L Pedal Pulse +2   Wound 06/18/23 Leg Right Circumferential   Date First Assessed/Time First Assessed: 06/18/23 0900   Present on Original Admission: Yes  Wound Approximate Age at First Assessment (Weeks): 4 weeks  Primary Wound Type: Venous Ulcer  Location: Leg  Wound Location Orientation: Right  Wound Descript...   Wound Etiology Venous   Dressing Status Old drainage noted   Wound Cleansed Soap and water   Offloading for Diabetic Foot Ulcers Offloading not required   Wound Length (cm) 16.4 cm   Wound Width (cm) 33 cm   Wound Depth (cm) 0.1 cm   Wound Surface Area (cm^2) 541.2 cm^2   Change in Wound Size % (l*w) -39.66   Wound Volume (cm^3) 54.12 cm^3   Wound Healing % 30   Wound Assessment Pink/red;Slough   Drainage Amount Moderate (25-50%)   Drainage Description Serosanguinous   Odor Moderate   Peri-wound Assessment Hemosiderin staining (brown yellow);Maceration   Margins Undefined edges   Wound Thickness Description not for Pressure Injury Full thickness   Wound 06/18/23 Leg Left Circumferential   Date First Assessed/Time First Assessed: 06/18/23 0902   Present on Original Admission: Yes  Wound Approximate Age at First Assessment (Weeks): 4 weeks  Primary Wound Type: Venous Ulcer  Location: Leg  Wound Location Orientation:  Left  Wound Descripti...   Wound Etiology Venous   Dressing Status Old drainage noted   Wound Cleansed Soap and water   Offloading for Diabetic Foot Ulcers Offloading not required   Wound Length (cm) 24 cm   Wound Width (cm) 41 cm   Wound Depth (cm) 0.1 cm   Wound Surface Area (cm^2) 984 cm^2   Change in Wound Size % (l*w) -615.12   Wound Volume (cm^3) 98.4 cm^3   Wound Healing % -615   Wound Assessment Pink/red;Slough   Drainage Amount Moderate (25-50%)   Drainage Description Serosanguinous   Odor Moderate   Peri-wound Assessment Hemosiderin staining (brown yellow);Maceration   Margins Undefined edges   Wound Thickness Description not for Pressure Injury Full thickness     BP 137/71   Pulse 84   Temp 97.7 F (36.5 C) (Temporal)   Resp 18

## 2023-08-13 NOTE — Other (Deleted)
08/13/23 0920   Right Leg Edema Point of Measurement   Leg circumference 39 cm   Ankle circumference 23 cm   Compression Therapy 2 layer compression wrap   Left Leg Edema Point of Measurement   Leg circumference 44.5 cm   Ankle circumference 24 cm   Compression Therapy 2 layer compression wrap   Peripheral Vascular   RLE Edema +2;Pitting   LLE Edema +2;Pitting   RLE Neurovascular Assessment   Capillary Refill Less than/Equal to 3 seconds   Color Yellow-Brown/Hemosiderin Staining   Temperature Warm   R Pedal Pulse +2   LLE Neurovascular Assessment   Capillary Refill Less than/Equal to 3 seconds   Color Yellow-Brown/Hemosiderin Staining   Temperature Warm   LLE Sensation  Full sensation   L Pedal Pulse +2   Wound 06/18/23 Leg Right Circumferential   Date First Assessed/Time First Assessed: 06/18/23 0900   Present on Original Admission: Yes  Wound Approximate Age at First Assessment (Weeks): 4 weeks  Primary Wound Type: Venous Ulcer  Location: Leg  Wound Location Orientation: Right  Wound Descript...   Wound Etiology Venous   Dressing Status Old drainage noted   Wound Cleansed Soap and water   Offloading for Diabetic Foot Ulcers Offloading not required   Wound Assessment Pink/red;Slough   Drainage Amount Moderate (25-50%)   Drainage Description Serosanguinous   Odor Moderate   Peri-wound Assessment Hemosiderin staining (brown yellow);Maceration   Margins Undefined edges   Wound Thickness Description not for Pressure Injury Full thickness   Wound 06/18/23 Leg Left Circumferential   Date First Assessed/Time First Assessed: 06/18/23 0902   Present on Original Admission: Yes  Wound Approximate Age at First Assessment (Weeks): 4 weeks  Primary Wound Type: Venous Ulcer  Location: Leg  Wound Location Orientation: Left  Wound Descripti...   Wound Etiology Venous   Dressing Status Old drainage noted   Wound Cleansed Soap and water   Offloading for Diabetic Foot Ulcers Offloading not required   Wound Assessment  Pink/red;Slough   Drainage Amount Moderate (25-50%)   Drainage Description Serosanguinous   Odor Moderate   Peri-wound Assessment Hemosiderin staining (brown yellow);Maceration   Margins Undefined edges   Wound Thickness Description not for Pressure Injury Full thickness     BP 137/71   Pulse 84   Temp 97.7 F (36.5 C) (Temporal)   Resp 18

## 2023-08-13 NOTE — Patient Instructions (Addendum)
Discharge Instructions for  [facility]  [address]  [place], [place] [address]  Phone: [phone] Fax: [phone]    NAME:  Jamie Bryant  DATE OF BIRTH:  [birth year]  MEDICAL RECORD NUMBER:  [id number]  DATE:  August 13, 2023    [facility]    WOUND CARE ORDERS:  Bilateral Leg wounds: Cleanse with baby shampoo. Rinse and pat dry. Apply gentamicin ointment wound beds.Apply 2.5% hydrocortisone cream to intact skin. Cover open areas with Vaseline gauze, top with ABD pads then superabsorber. Secure with 2- layer compression wrap. Change dressing three times a week.        Activity:  [x]  Elevate leg(s) above the level of the heart when sitting.  [x]  Avoid prolonged standing in one place.   [x]  Do no get dressing/wrap wet.       Dietary:  []  Diet as tolerated      [x]  Diabetic Diet            []  Increase Protein: examples (Meat, cheese, eggs, greek yogurt, fish, nuts)          []  Juven Therapeutic Nutrition Powder  []  Other:       Return Appointment:  [x]  Return Appointment: With Dr. Jacklynn Bue in 2 weeks.  []  Nurse Visit :   []  Ordered tests:      Electronically signed Loretha Brasil, RN on 08/13/2023 at 9:15 AM     Wound Care Center Information: Should you experience any significant changes in your wound(s) or have questions about your wound care, please contact the [facility] at [facility] - FRIDAY 8:00 am - 4:30.  If you need help with your wound outside these hours and cannot wait until we are again available, contact your PCP or go to the hospital emergency room.     PLEASE NOTE: IF YOU ARE UNABLE TO OBTAIN WOUND SUPPLIES, CONTINUE TO USE THE SUPPLIES YOU HAVE AVAILABLE UNTIL YOU ARE ABLE TO REACH [place]. IT IS MOST IMPORTANT TO KEEP THE WOUND COVERED AT ALL TIMES.     Physician Signature:_______________________    Date: ___________ Time:  ____________

## 2023-08-16 ENCOUNTER — Encounter

## 2023-08-17 MED ORDER — PREGABALIN 300 MG PO CAPS
300 | ORAL_CAPSULE | Freq: Every day | ORAL | 11 refills | Status: DC
Start: 2023-08-17 — End: 2023-08-20

## 2023-08-20 ENCOUNTER — Encounter

## 2023-08-20 NOTE — Telephone Encounter (Signed)
WENT TO WRONG PHARMACY WANT BRAND ONLY

## 2023-08-21 MED ORDER — PREGABALIN 300 MG PO CAPS
300 | ORAL_CAPSULE | Freq: Every day | ORAL | 1 refills | Status: AC
Start: 2023-08-21 — End: 2024-08-19

## 2023-08-26 ENCOUNTER — Encounter

## 2023-08-27 ENCOUNTER — Inpatient Hospital Stay
Admit: 2023-08-27 | Discharge: 2023-08-27 | Payer: Medicare Other | Attending: Radiation Oncology | Primary: Internal Medicine

## 2023-08-27 VITALS — BP 149/69 | HR 80 | Temp 97.80000°F | Resp 18

## 2023-08-27 DIAGNOSIS — I872 Venous insufficiency (chronic) (peripheral): Secondary | ICD-10-CM

## 2023-08-27 MED ORDER — SITAGLIPTIN PHOSPHATE 100 MG PO TABS
100 MG | ORAL_TABLET | Freq: Every day | ORAL | 3 refills | Status: DC
Start: 2023-08-27 — End: 2024-05-11

## 2023-08-27 MED ORDER — PREGABALIN 300 MG PO CAPS
300 | ORAL_CAPSULE | Freq: Every day | ORAL | 3 refills | Status: DC
Start: 2023-08-27 — End: 2023-10-02

## 2023-08-27 NOTE — Patient Instructions (Addendum)
 Discharge Instructions for  [facility]  [address]  [place], [place] [address]  Phone: [phone] Fax: [phone]    NAME:  Jamie Bryant  DATE OF BIRTH:  06-08-1941  MEDICAL RECORD NUMBER:  [id number]  DATE:  August 27, 2023    Home Health: [facility]    WOUND CARE ORDERS:  Bilateral Leg wounds: Cleanse with baby shampoo. Rinse and pat dry. Apply A&D ointment to dry/ flaky areas.  Apply gentamicin  ointment wound beds.Apply 2.5% hydrocortisone  cream to intact skin. Cover open areas with Vaseline gauze, top with ABD pads then superabsorber. Secure with 2- layer compression wrap. Change dressing three times a week. On weeks when visiting the wound clinic, may change twice a week.         Activity:  [x]  Elevate leg(s) above the level of the heart when sitting.  [x]  Avoid prolonged standing in one place.   [x]  Do no get dressing/wrap wet.       Dietary:  []  Diet as tolerated      [x]  Diabetic Diet            []  Increase Protein: examples (Meat, cheese, eggs, greek yogurt, fish, nuts)          []  Juven Therapeutic Nutrition Powder  []  Other:       Return Appointment:  [x]  Return Appointment: With Dr. Dagoberto Cheney in 1 weeks.  []  Nurse Visit :   []  Ordered tests:      Electronically signed Harlene Reasoner, RN on 08/27/2023 at 9:44 AM     Wound Care Center Information: Should you experience any significant changes in your wound(s) or have questions about your wound care, please contact the [facility] Center at [facility] - FRIDAY 8:00 am - 4:30.  If you need help with your wound outside these hours and cannot wait until we are again available, contact your PCP or go to the hospital emergency room.     PLEASE NOTE: IF YOU ARE UNABLE TO OBTAIN WOUND SUPPLIES, CONTINUE TO USE THE SUPPLIES YOU HAVE AVAILABLE UNTIL YOU ARE ABLE TO REACH US . IT IS MOST IMPORTANT TO KEEP THE WOUND COVERED AT ALL TIMES.     Physician Signature:_______________________    Date: ___________ Time:  ____________

## 2023-08-27 NOTE — Progress Notes (Signed)
 [facility]   Wound Care and [facility] Oxygen Therapy Center   Medical Staff Progress Note     Jamie Bryant  MEDICAL RECORD NUMBER:  [id number]  AGE: 82 y.o.   GENDER: female  DOB: 1941/02/17  EPISODE DATE:  08/27/2023    Chief complaint and reason for visit:     Chief Complaint   Jamie Bryant presents for follow up of her recurrent bilateral lower leg venous ulcers     Follow-up     Leg wounds      Jamie Bryant presenting for follow up evaluation of wound(s) per chief complaint.      Subjective and ROS:  Jamie Bryant,  a 82 yo female with history of DM 2; HTN, CVA CHF, Parkinson's, delusion, chronic asthma, alleged dementia, CRF and chronic venous insufficiency of the lower legs, returns to the [facility] at the request of her PCP, Dr Graylon,  for evaluation of her recurrent bilateral lower leg venous ulcers.   Jamie Bryant was seen back in March, 2024 for wound care to her lower legs with venous ulcers. She failed to return for follow up after 3 visits and wounds incompletely healed.       Most recently according to the Jamie Bryant she was hospitalized at a [facility] hospital for CHF and then transferred to a nursing facility ([facility]).   After discharge or during her medical care, she developed new bilateral lower leg venous ulcers spontaneously. She complains of pain but cannot quantitated; has heavy drainage without odor. Has not use any compression stockings of late.  Claims she has some Santyl  at home that was ordered earlier this year but never used.       Has diabetes and unclear about her A1c.  Chart shows A1C of 6.3 on 06/09/23.  Denies smoking.  Jamie Bryant states she is allergic to Lidocaine  and decline to have it used today.     June 25, 2023-  On follow up, Jamie Bryant feels worst.  Complains of the itch and pain in both the lower leg. Jamie Bryant has been taking off her compression stockings off and on, not liking it or just doing it. Comes in without any dressings today.  Has new blisters that are sensitive and  pruritic.  She has taken Benadryl .   Complains of lower legs painful.  Denies any fever.  Has no SOB or chest pains.     July 09, 2023- since last seen, Jamie Bryant  has had her dressing changed at the Wound Care clinic to help get her wounds cleaned and provide the proper compression she needs.  She had been taking off her compression stocking due to discomfort or confusion.  She is allergic to lidocaine  and unable to mechanically remove the slough.  Use of hydrocortisone  cream and vaseline gauze and ABD and Drawtex have helped with her wound.   She returns with less edematous lower legs and more content.     Follow-up July 16, 2023-Jamie Bryant with bilateral venous stasis edema and dermatitis and open ulcerations but Jamie Bryant with dementia and difficult social situation lives by herself, is on the verge of being discharged from home health due to noncompliance.     July 30, 2023-  On follow up, the Jamie Bryant continues to complain of pain, 15 of 10 .  Nothing new on the social situation.  Still decline any ability to debride the wound.  Denies any fever or chills.       August 13, 2023-  On follow up, Jamie  Bryant continues to complain of pain, 15 of 10 everyday all day.  She is having more drainage with odor; has been having her wraps changed 2 x a week. Did not get gentamicin   topical ointments or  hydrocortisone  cream, stating not called in. Admits to scratching due to itch.  She denies any fever or chills.  She states she is not sure what to do. Talks about dead people in her apartment.  She is looking for another place to live now. Has a son in town. And a daughter in [place] ?     August 27, 2023-  On follow up, Jamie Bryant returns with same complaints of pain.  However, she rates them as 10 of 10.  She claims she took 4 Tylenol  pills before she came to the [facility] ( 2 at 5 am and 2 just before she came).  Complains of fluid pooling at her ankle and making them itchy.  Claims trying not to scratch.  Unclear if she is  actually wearing her compression, as there are some reports from the [facility] that at times ( nurse's contact with [facility] on their findings) there are no wrapping on her leg when they make their visits.  Jamie Bryant is inconsistent on whether or not she replaces her wraps or not.  Denies any fever.  Claims she is eating well    History of Wound Context: Per original history and physical on this Jamie Bryant. Changes in history since last evaluation: none    Medical Decision Making:     Problem List Items Addressed This Visit    None      Wounds and Treatment Plan:  Bilateral lower legs - diffuse with more and heavy coated slough and patchy areas of dry eschars and pigmented dermis, suggestive there are times the wraps were not in use. Skin in the most distal lower leg with very dry shiny skin. No signs of active scratching.  No increased in warmth.  Moderate drainage with moderate odor.  Areas of erythema the same. Appears worst than last visit. Tender to touch but much less resistant than before.   Jamie Bryant comes prepared for debridement and consents to debridement which was performed very gingerly.    Treatment - Debridement is recommended.  Jamie Bryant agreed.  Claims to be allergic to lidocaine  and thus not used.  With a 5 mm curette, the thick layers of slough and biofilm were ever so gently scraped off the wounds.  When she complained of pain, the area working on was stopped.  She did well although not all the slough could be removed.  She declined  various products as used in the past, claiming she did not like them due to pain, discomfort or allergic to them.  Wound was cleaned with saline solution.    Wound care - Options are limited due to Jamie Bryant's limited permission.  Her level of cognitive ability makes it hard to manage wound.  She has no family members living with her.  Will continue to have her cleanse with baby shampoo .  Will check if she received her medicine and check with HH.  Will continue to cover with vaseline gauze  and top with ABD and superabsorber.  Secure with 2 layer compression wrap. She likes and consents to this arrangement  Jamie Bryant advised not to scratch as she can make wound worst.  Will have her come more frequently as she is now allowing for debridement and see if this would help her.  Follow up in  1 week  Mineful of diabetic diet  Delusion /confusion -a  contributing factor to her slow recovery       Other associated diagnoses or problems addressed:  none    Pertinent imaging reviewed including independent interpretation include:  None    Pertinent labs reviewed.   Medical records and review of external note (s) from other providers done as well.    New lab or imaging orders placed:  None     Prescription drug management: N/A     Discussion of management or test interpretation with  Jamie Bryant who comes alone - has no family to help  .    Comorbid conditions affecting wound healing: As per PMH which was reviewed.    Risk of complications and/or mortality of Jamie Bryant management:  This Jamie Bryant has a minimal risk of morbidity and mortality from additional diagnostic testing or treatment. This is due to the above conditions affecting wound healing as well as Jamie Bryant and procedure risk factors. Education and discussion held with Jamie Bryant regarding these disease processes pertinent to wound(s).  Other pertinent decisions include: minor surgery or procedures as below.  The Jamie Bryant's diagnosis or treatment is  significantly limited by social determinants of health as noted by:  her underlying mental condition and lack of family support .    Wound 06/18/23 Leg Right Circumferential (Active)   Wound Image     08/27/23 0911   Wound Etiology Venous 08/27/23 0911   Dressing Status New dressing applied 08/27/23 1019   Wound Cleansed Soap and water  08/27/23 0911   Dressing/Treatment Petroleum gauze;Other (comment);ABD 08/13/23 1022   Offloading for Diabetic Foot Ulcers Offloading not required 08/27/23 0911   Wound Length (cm) 12 cm  08/27/23 0911   Wound Width (cm) 32.5 cm 08/27/23 0911   Wound Depth (cm) 0.1 cm 08/27/23 0911   Wound Surface Area (cm^2) 390 cm^2 08/27/23 0911   Change in Wound Size % (l*w) -0.65 08/27/23 0911   Wound Volume (cm^3) 39 cm^3 08/27/23 0911   Wound Healing % 50 08/27/23 0911   Wound Assessment Pink/red;Slough 08/27/23 0911   Drainage Amount Moderate (25-50%) 08/27/23 0911   Drainage Description Serosanguinous 08/27/23 0911   Odor Moderate 08/27/23 0911   Peri-wound Assessment Hemosiderin staining (brown yellow) 08/27/23 0911   Margins Undefined edges 08/27/23 0911   Wound Thickness Description not for Pressure Injury Full thickness 08/27/23 0911   Number of days: 71       Wound 06/18/23 Leg Left Circumferential (Active)   Wound Image    08/27/23 0911   Wound Etiology Venous 08/27/23 0911   Dressing Status New dressing applied 08/27/23 1019   Wound Cleansed Soap and water  08/27/23 0911   Dressing/Treatment ABD;Petroleum gauze;Other (comment) 08/13/23 1022   Offloading for Diabetic Foot Ulcers Offloading not required 08/27/23 0911   Wound Length (cm) 12 cm 08/27/23 0911   Wound Width (cm) 34.5 cm 08/27/23 0911   Wound Depth (cm) 0.2 cm 08/27/23 0911   Wound Surface Area (cm^2) 414 cm^2 08/27/23 0911   Change in Wound Size % (l*w) -200.87 08/27/23 0911   Wound Volume (cm^3) 82.8 cm^3 08/27/23 0911   Wound Healing % -502 08/27/23 0911   Wound Assessment Pink/red;Slough 08/27/23 0911   Drainage Amount Moderate (25-50%) 08/27/23 0911   Drainage Description Serosanguinous 08/27/23 0911   Odor Moderate 08/27/23 0911   Peri-wound Assessment Hemosiderin staining (brown yellow) 08/27/23 0911   Margins Unattached edges 08/27/23 0911   Wound Thickness Description not for Pressure  Injury Full thickness 08/27/23 0911   Number of days: 71          Procedures done during this encounter:   Debridement: Excisional Debridement  Indications:  Based on my examination of this Jamie Bryant's wound(s)/ulcer(s) today, debridement is required to  promote healing and evaluate the wound base. Risks and benefits discussed with Jamie Bryant who has agreed to proceed.   Performed by: Dagoberto LITTIE Cheney, MD  Consent obtained:  Yes  Time out taken:  Yes  Pain Control: Anesthetic  Anesthetic: Other (comment) (none; allergy to lidocaine )   Using curette the wound(s)/ulcer(s) was/were debrided down through and including the removal of epidermis, dermis, and subcutaneous tissue.      Devitalized Tissue Debrided:  biofilm, slough, and necrotic/eschar  Pre Debridement Measurements:  Are located in the Wound/Ulcer Documentation Flow Sheet  Wound/Ulcer #:  diffuse large areas involving both lower legs, circumferential   Post Debridement Measurements:  Wound/Ulcer Descriptions are Pre Debridement except measurements:  Total Surface Area Debrided:  703 sq cm   Diabetic/Pressure/Non Pressure Ulcers only:  Ulcer: Non-Pressure ulcer, fat layer exposed   Estimated Blood Loss:  None  Hemostasis Achieved:  not needed  Procedural Pain:  10  / 10   Post Procedural Pain:  10 / 10   Response to treatment:  Well tolerated by Jamie Bryant., With complaints of pain.     TIME: E/M Time spent with Jamie Bryant and/or Jamie Bryant care issues: []  15-20 min  [x]  21-30 min  []  31-44 min  []  45 min or more.   This is above the usual time needed to address Jamie Bryant's chief complaint today: []  Yes  []  No  This time includes physician non-face-to-face service time visit on the date of service such as  Preparing to see the Jamie Bryant (eg, review of tests)  Obtaining and/or reviewing separately obtained history  Performing a medically necessary appropriate examination and/or evaluation  Counseling and educating the Jamie Bryant/family/caregiver  Ordering medications, tests, or procedures  Referring and communicating with other health care professionals as needed  Documenting clinical information in the electronic or other health record  Independently interpreting results (not reported separately) and communicating results to the  Jamie Bryant/family/caregiver  Care coordination (not reported separately)    Objective:    BP (!) 149/69   Pulse 80   Temp 97.8 F (36.6 C) (Temporal)   Resp 18   Wt Readings from Last 3 Encounters:   07/10/23 98 kg (216 lb 1.6 oz)   06/18/23 99.8 kg (220 lb)   06/09/23 99.9 kg (220 lb 4.8 oz)       PHYSICAL EXAM  General: Alert and in no acute distress. Normal appearing. Conversant and confusing at times in conversation ( going in and out of delusion as if in normal conversation).  Resting comfortably but complaining of resting pain of 10/10 with and without procedure.  Skin: Warm and dry, no rash  Head: Normocephalic and atraumatic  Eyes: Extraocular eye movements intact, conjunctivae normal, and sclera anicteric  ENT: Hearing grossly normal bilaterally. Normal appearance  Respiratory:  no respiratory distress  Musculoskeletal: Baseline range of motion in joints. Nontender calves. No cyanosis. Edema 2+  Bilateral lower leg ulcers - diffuse and worst with thick layer of slough and moderate malodorous drainage. .  Neurologic: Speech normal. At baseline without new focal deficits. Mental status at baseline. Response to questions in most part are appropriate.      PAST MEDICAL HISTORY        Diagnosis Date  Arthritis     Asthma     Chronic renal disease, stage III [facility]) [[id number]] 10/13/2022    Depression     Diabetes (HCC)     Fibromyalgia     Fibromyalgia     Gastroparesis     Hyperlipemia     Hypertension     Neuropathy     Psychotic disorder (HCC)     Stroke (HCC)        PAST SURGICAL HISTORY    Past Surgical History:   Procedure Laterality Date    COLONOSCOPY      HYSTERECTOMY (CERVIX STATUS UNKNOWN)         FAMILY HISTORY    Family History   Problem Relation Age of Onset    Breast Cancer Maternal Grandmother     Cancer Father         lung ca    Hypertension Father     Diabetes Mother     Hypertension Mother        SOCIAL HISTORY    Social History     Tobacco Use    Smoking status: Never     Passive exposure:  Never    Smokeless tobacco: Never   Vaping Use    Vaping status: Never Used   Substance Use Topics    Alcohol use: No    Drug use: No       ALLERGIES    Allergies   Allergen Reactions    Iodinated Contrast Media Shortness Of Breath    Statins Myalgia     Lovastatin, pravastatin, simvastatin, atorvastatin, zetia, livalo, lovaza    Adhesive Tape Rash    Ampicillin Rash    Gabapentin Nausea And Vomiting    Lidocaine  Rash       MEDICATIONS    Current Outpatient Medications on File Prior to Encounter   Medication Sig Dispense Refill    SITagliptin  (JANUVIA ) 100 MG tablet Take 1 tablet by mouth daily 90 tablet 3    pregabalin  (LYRICA ) 300 MG capsule Take 1 capsule by mouth daily for 364 days. DAW BRAND NAME ONLY Max Daily Amount: 300 mg 90 capsule 3    hydrocortisone  2.5 % cream Apply topically topically to areas of pruritus with each change of dressing 3.5 g 1    REPATHA  SURECLICK 140 MG/ML SOAJ INJECT AS DIRECTED EVERY 2 WEEKS 2 mL 11    omeprazole  (PRILOSEC) 40 MG delayed release capsule Take 1 capsule by mouth every morning (before breakfast) 30 capsule 0    lisinopril  (PRINIVIL ;ZESTRIL ) 40 MG tablet Take 1 tablet by mouth daily (Jamie Bryant not taking: Reported on 06/18/2023)      quinapril  (ACCUPRIL ) 40 MG tablet Take 1 tablet by mouth daily (Jamie Bryant not taking: Reported on 04/20/2023)  0    torsemide  (DEMADEX ) 20 MG tablet Take 5 tablets by mouth daily (Jamie Bryant not taking: Reported on 07/16/2023)      linaclotide  (LINZESS ) 290 MCG CAPS capsule Take 1 capsule by mouth every morning (before breakfast) 30 capsule     beclomethasone (QVAR REDIHALER) 40 MCG/ACT AERB inhaler Inhale 1 puff into the lungs in the morning and 1 puff in the evening. (Jamie Bryant not taking: Reported on 07/09/2023)      amLODIPine  (NORVASC ) 5 MG tablet Take 1 tablet by mouth daily (Jamie Bryant not taking: Reported on 04/20/2023)  0    PARoxetine  (PAXIL ) 10 MG tablet Take 1 tablet by mouth daily (Jamie Bryant not taking: Reported on 06/18/2023)  0     albuterol -ipratropium (COMBIVENT RESPIMAT)  20-100 MCG/ACT AERS inhaler Inhale 1 puff into the lungs every 6 hours (Jamie Bryant not taking: Reported on 07/16/2023)      insulin  glargine (LANTUS ) 100 UNIT/ML injection vial Inject 14 Units into the skin nightly (Jamie Bryant not taking: Reported on 06/18/2023) 10 mL 3    insulin  lispro (HUMALOG ) 100 UNIT/ML SOLN injection vial Inject 0-4 Units into the skin 4 times daily (before meals and nightly) (Jamie Bryant not taking: Reported on 07/16/2023)      linezolid  (ZYVOX ) 600 MG tablet Take 1 tablet by mouth 2 times daily Bid FOR 14 DAYS (Jamie Bryant not taking: Reported on 03/05/2023)      arformoterol  tartrate 15 MCG/2ML NEBU 15 mcg, budesonide  0.25 MG/2ML SUSP 250 mcg Take 1 Dose by nebulization in the morning and 1 Dose in the evening. (Jamie Bryant not taking: Reported on 12/18/2022) 60 Units 11    albuterol  sulfate HFA (VENTOLIN  HFA) 108 (90 Base) MCG/ACT inhaler Inhale 2 puffs into the lungs 4 times daily as needed for Wheezing (Jamie Bryant not taking: Reported on 12/18/2022) 54 g 1    albuterol  (PROVENTIL ) (2.5 MG/3ML) 0.083% nebulizer solution Take 3 mLs by nebulization 4 times daily (Jamie Bryant not taking: Reported on 04/20/2023) 120 each 3    QUEtiapine  (SEROQUEL ) 25 MG tablet Take 0.5 tablets by mouth nightly (Jamie Bryant not taking: Reported on 02/16/2023) 60 tablet 3    arformoterol  tartrate (BROVANA ) 15 MCG/2ML NEBU Take 1 ampule by nebulization 2 times daily (Jamie Bryant not taking: Reported on 12/18/2022) 120 mL 3    budesonide  (PULMICORT ) 0.25 MG/2ML nebulizer suspension Take 2 mLs by nebulization 2 times daily (Jamie Bryant not taking: Reported on 12/18/2022) 60 each 3    albuterol  sulfate HFA (VENTOLIN  HFA) 108 (90 Base) MCG/ACT inhaler Inhale 2 puffs into the lungs 4 times daily as needed for Wheezing (Jamie Bryant not taking: Reported on 04/20/2023) 18 g 0    carvedilol  (COREG ) 12.5 MG tablet Take 1 tablet by mouth 2 times daily      Multiple Vitamins-Minerals (THERAPEUTIC MULTIVITAMIN-MINERALS) tablet  Take 1 tablet by mouth daily      aspirin  81 MG chewable tablet Take 1 tablet by mouth daily 30 tablet 11    vitamin D3 (CHOLECALCIFEROL ) 125 MCG (5000 UT) TABS tablet Take 1 tablet by mouth daily 30 tablet 11    quinapril  (ACCUPRIL ) 40 MG tablet quinapril  40 mg tablet   TAKE 1 TABLET BY MOUTH EVERY NIGHT FOR HIGH BLOOD PRESSURE (Jamie Bryant not taking: Reported on 12/19/2022) 90 tablet 3    insulin  lispro, 1 Unit Dial, (HUMALOG /ADMELOG ) 100 UNIT/ML SOPN Humalog  KwikPen (U-100) Insulin  100 unit/mL subcutaneous   INJECT UP TO 50 UNITS UNDER THE SKIN DAILY AS DIRECTED (Jamie Bryant not taking: Reported on 04/20/2023)      ipratropium (ATROVENT HFA) 17 MCG/ACT inhaler Atrovent HFA 17 mcg/actuation aerosol inhaler (Jamie Bryant not taking: Reported on 02/16/2023)      linaclotide  (LINZESS ) 290 MCG CAPS capsule Take 1 capsule by mouth as needed (Jamie Bryant not taking: Reported on 06/09/2023)      olopatadine (PATANOL) 0.1 % ophthalmic solution Apply 2 drops to eye 2 times daily (Jamie Bryant not taking: Reported on 04/20/2023)       No current facility-administered medications on file prior to encounter.         Written Jamie Bryant dismissal instructions given to Jamie Bryant and signed by Jamie Bryant or POA.         Electronically signed by Dagoberto LITTIE Cheney, MD on 08/28/2023 at 11:16 AM

## 2023-08-27 NOTE — Flowsheet Note (Signed)
 08/27/23 1019   Right Leg Edema Point of Measurement   Compression Therapy 2 layer compression wrap   Left Leg Edema Point of Measurement   Compression Therapy 2 layer compression wrap   Wound 06/18/23 Leg Right Circumferential   Date First Assessed/Time First Assessed: 06/18/23 0900   Present on Original Admission: Yes  Wound Approximate Age at First Assessment (Weeks): 4 weeks  Primary Wound Type: Venous Ulcer  Location: Leg  Wound Location Orientation: Right  Wound Descript...   Dressing Status New dressing applied   Dressing/Treatment   (A&D ointment to dry/ flaky areas. 2.5% hydrocortisone  cream to intact skin. Gentamicin  ointment to wound beds, vaseline gauze, ABD, Drawtex)   Wound 06/18/23 Leg Left Circumferential   Date First Assessed/Time First Assessed: 06/18/23 0902   Present on Original Admission: Yes  Wound Approximate Age at First Assessment (Weeks): 4 weeks  Primary Wound Type: Venous Ulcer  Location: Leg  Wound Location Orientation: Left  Wound Descripti...   Dressing Status New dressing applied   Dressing/Treatment   (A&D ointment to dry/ flaky areas. 2.5% hydrocortisone  cream to intact skin. Gentamicin  ointment to wound beds, vaseline gauze, ABD, Drawtex)     Multilayer Compression Wrap   (Not Unna) Below the Knee    NAME:  Jamie Bryant  DATE OF BIRTH:  [birth year]  MEDICAL RECORD NUMBER:  [id number]  DATE:  August 27, 2023    Removed old dressing; wash with soap and water , Applied primary and secondary dressing as ordered, Placed compression to right lower leg, Place compression to left lower leg, Instructed patient/caregiver not to remove dressing and to keep it clean and dry, Instructed patient/caregiver on complications to report to provider, such as pain, numbness in toes, heavy drainage, and slippage of dressing, and Instructed patient/caregiver on purpose of compression dressing and on activity and exercise recommendations    Response to treatment: Well tolerated by  patient      Electronically signed by Powell Bart, RN on 08/27/2023 at 10:45 AM

## 2023-08-27 NOTE — Flowsheet Note (Signed)
 08/27/23 0911   Anesthetic   Anesthetic Other (comment)  (none; allergy to lidocaine )   Right Leg Edema Point of Measurement   Leg circumference 41.5 cm   Ankle circumference 24 cm   Compression Therapy 2 layer compression wrap   Left Leg Edema Point of Measurement   Leg circumference 44 cm   Ankle circumference 25 cm   Compression Therapy 2 layer compression wrap   Peripheral Vascular   RLE Edema +2;Pitting   LLE Edema +2;Pitting   RLE Neurovascular Assessment   Capillary Refill Less than/Equal to 3 seconds   Color Yellow-Brown/Hemosiderin Staining   Temperature Warm   RLE Sensation  Full sensation   R Pedal Pulse +2   LLE Neurovascular Assessment   Capillary Refill Less than/Equal to 3 seconds   Color Yellow-Brown/Hemosiderin Staining   Temperature Warm   LLE Sensation  Full sensation   L Pedal Pulse +2   Wound 06/18/23 Leg Right Circumferential   Date First Assessed/Time First Assessed: 06/18/23 0900   Present on Original Admission: Yes  Wound Approximate Age at First Assessment (Weeks): 4 weeks  Primary Wound Type: Venous Ulcer  Location: Leg  Wound Location Orientation: Right  Wound Descript...   Wound Image      Wound Etiology Venous   Dressing Status Old drainage noted   Wound Cleansed Soap and water    Offloading for Diabetic Foot Ulcers Offloading not required   Wound Length (cm) 12 cm   Wound Width (cm) 32.5 cm   Wound Depth (cm) 0.1 cm   Wound Surface Area (cm^2) 390 cm^2   Change in Wound Size % (l*w) -0.65   Wound Volume (cm^3) 39 cm^3   Wound Healing % 50   Wound Assessment Pink/red;Slough   Drainage Amount Moderate (25-50%)   Drainage Description Serosanguinous   Odor Moderate   Peri-wound Assessment Hemosiderin staining (brown yellow)   Margins Undefined edges   Wound Thickness Description not for Pressure Injury Full thickness   Wound 06/18/23 Leg Left Circumferential   Date First Assessed/Time First Assessed: 06/18/23 0902   Present on Original Admission: Yes  Wound Approximate Age at First  Assessment (Weeks): 4 weeks  Primary Wound Type: Venous Ulcer  Location: Leg  Wound Location Orientation: Left  Wound Descripti...   Wound Image     Wound Etiology Venous   Dressing Status Old drainage noted   Wound Cleansed Soap and water    Offloading for Diabetic Foot Ulcers Offloading not required   Wound Length (cm) 12 cm   Wound Width (cm) 34.5 cm   Wound Depth (cm) 0.2 cm   Wound Surface Area (cm^2) 414 cm^2   Change in Wound Size % (l*w) -200.87   Wound Volume (cm^3) 82.8 cm^3   Wound Healing % -502   Wound Assessment Pink/red;Slough   Drainage Amount Moderate (25-50%)   Drainage Description Serosanguinous   Odor Moderate   Peri-wound Assessment Hemosiderin staining (brown yellow)   Margins Unattached edges   Wound Thickness Description not for Pressure Injury Full thickness   Pain Assessment   Pain Assessment 0-10   Pain Level 3   Patient's Stated Pain Goal 0 - No pain   Pain Location Leg   Pain Orientation Right;Left   Pain Descriptors Aching;Burning   BP (!) 149/69   Pulse 80   Temp 97.8 F (36.6 C) (Temporal)   Resp 18

## 2023-08-27 NOTE — Telephone Encounter (Signed)
 TC to [facility]. Spoke to [employer]. Verified patient name/ DOB. Per RN, dressings have been changed twice a week. Patient's nurse Harlene, RN also reports that patient has been attempting to change the dressing on her own with supplies left in the home, sometimes twice a day. Also, has witness that patient was not wearing compression wraps when she arrived for visits or the wrap is intact with tissues or washcloths tucked into the wrap.     Advised that patient will come for appointment next week on Thursday. Harlene, RN verbalizes understanding.

## 2023-08-28 ENCOUNTER — Encounter

## 2023-09-03 ENCOUNTER — Inpatient Hospital Stay: Admit: 2023-09-03 | Discharge: 2023-09-03 | Payer: MEDICARE | Attending: Radiation Oncology | Primary: Internal Medicine

## 2023-09-03 VITALS — BP 130/73 | HR 77 | Temp 97.50000°F | Resp 18

## 2023-09-03 DIAGNOSIS — I87311 Chronic venous hypertension (idiopathic) with ulcer of right lower extremity: Secondary | ICD-10-CM

## 2023-09-03 MED ORDER — HYDROCORTISONE 2.5 % EX CREA
2.5 | CUTANEOUS | 1 refills | Status: DC
Start: 2023-09-03 — End: 2024-08-23

## 2023-09-03 MED ORDER — GENTAMICIN SULFATE 0.1 % EX OINT
0.1 | Freq: Once | CUTANEOUS | Status: DC
Start: 2023-09-03 — End: 2023-09-04

## 2023-09-03 NOTE — Flowsheet Note (Signed)
 09/03/23 0902   Right Leg Edema Point of Measurement   Leg circumference 43 cm   Ankle circumference 24 cm   Compression Therapy 2 layer compression wrap   Left Leg Edema Point of Measurement   Leg circumference 43 cm   Ankle circumference 25.5 cm   Compression Therapy 2 layer compression wrap   Peripheral Vascular   RLE Edema +1;Non-pitting   LLE Edema +2;Pitting   RLE Neurovascular Assessment   Capillary Refill Less than/Equal to 3 seconds   Color Yellow-Brown/Hemosiderin Staining   Temperature Warm   RLE Sensation  Full sensation;Pain   R Pedal Pulse +2   LLE Neurovascular Assessment   Capillary Refill Less than/Equal to 3 seconds   Color Yellow-Brown/Hemosiderin Staining   Temperature Warm   LLE Sensation  Full sensation   L Pedal Pulse +2   Wound 06/18/23 Leg Right Circumferential   Date First Assessed/Time First Assessed: 06/18/23 0900   Present on Original Admission: Yes  Wound Approximate Age at First Assessment (Weeks): 4 weeks  Primary Wound Type: Venous Ulcer  Location: Leg  Wound Location Orientation: Right  Wound Descript...   Wound Image     Wound Etiology Venous   Dressing Status Old drainage noted;New drainage noted;Intact   Wound Cleansed Soap and water    Offloading for Diabetic Foot Ulcers Offloading not required   Wound Length (cm) 13 cm   Wound Width (cm) 32 cm   Wound Depth (cm) 0.2 cm   Wound Surface Area (cm^2) 416 cm^2   Change in Wound Size % (l*w) -7.35   Wound Volume (cm^3) 83.2 cm^3   Wound Healing % -7   Wound Assessment Pink/red;Slough   Drainage Amount Large (50-75% saturated)   Drainage Description Serosanguinous;Yellow;Green   Odor Moderate   Peri-wound Assessment Hemosiderin staining (brown yellow);Dry/flaky   Margins Undefined edges   Wound Thickness Description not for Pressure Injury Full thickness   Wound 06/18/23 Leg Left Circumferential   Date First Assessed/Time First Assessed: 06/18/23 0902   Present on Original Admission: Yes  Wound Approximate Age at First Assessment  (Weeks): 4 weeks  Primary Wound Type: Venous Ulcer  Location: Leg  Wound Location Orientation: Left  Wound Descripti...   Wound Image      Wound Etiology Venous   Dressing Status Old drainage noted;New drainage noted;Intact   Wound Cleansed Soap and water    Offloading for Diabetic Foot Ulcers Offloading not required   Wound Length (cm) 13.1 cm   Wound Width (cm) 36.5 cm   Wound Depth (cm) 0.2 cm   Wound Surface Area (cm^2) 478.15 cm^2   Change in Wound Size % (l*w) -247.49   Wound Volume (cm^3) 95.63 cm^3   Wound Healing % -595   Wound Assessment Pink/red;Slough   Drainage Amount Large (50-75% saturated)   Drainage Description Serosanguinous;Yellow;Green   Odor Moderate   Peri-wound Assessment Hemosiderin staining (brown yellow);Dry/flaky   Margins Undefined edges   Wound Thickness Description not for Pressure Injury Full thickness   Pain Assessment   Pain Assessment 0-10   Pain Level 9   Patient's Stated Pain Goal 0 - No pain   Pain Location Leg   Pain Orientation Right;Left   Pain Descriptors Burning   Functional Pain Assessment Prevents or interferes some active activities and ADLs   BP 130/73   Pulse 77   Temp 97.5 F (36.4 C) (Temporal)   Resp 18

## 2023-09-03 NOTE — Patient Instructions (Signed)
 Discharge Instructions for  [facility]  [address]  [place], [place] [address]  Phone: [phone] Fax: [phone]    NAME:  Jamie Bryant  DATE OF BIRTH:  03/11/1941  MEDICAL RECORD NUMBER:  [id number]  DATE:  September 03, 2023    Home [facility]: [facility]    WOUND CARE ORDERS:  Bilateral Leg wounds: Cleanse with baby shampoo. Rinse and pat dry. Apply A&D ointment to dry/ flaky areas.  Apply gentamicin  ointment wound beds.Apply 2.5% hydrocortisone  cream to intact skin. Cover open areas with Vaseline gauze, top with ABD pads then superabsorber. Secure with 2- layer compression wrap. Change dressing three times a week. On weeks when visiting the wound clinic, may change twice a week.         Activity:  [x]  Elevate leg(s) above the level of the heart when sitting.  [x]  Avoid prolonged standing in one place.   [x]  Do no get dressing/wrap wet.       Dietary:  []  Diet as tolerated      [x]  Diabetic Diet            []  Increase Protein: examples (Meat, cheese, eggs, greek yogurt, fish, nuts)          []  Juven Therapeutic Nutrition Powder  []  Other:       Return Appointment:  [x]  Return Appointment: With Dr. Dagoberto Cheney in 1 weeks.  []  Nurse Visit :   []  Ordered tests:      Electronically signed Montie Harriman, RN on 09/03/2023 at 9:01 AM     Wound Care Center Information: Should you experience any significant changes in your wound(s) or have questions about your wound care, please contact the [facility] at [facility] - FRIDAY 8:00 am - 4:30.  If you need help with your wound outside these hours and cannot wait until we are again available, contact your PCP or go to the hospital emergency room.     PLEASE NOTE: IF YOU ARE UNABLE TO OBTAIN WOUND SUPPLIES, CONTINUE TO USE THE SUPPLIES YOU HAVE AVAILABLE UNTIL YOU ARE ABLE TO REACH US . IT IS MOST IMPORTANT TO KEEP THE WOUND COVERED AT ALL TIMES.     Physician Signature:_______________________    Date: ___________ Time:   ____________

## 2023-09-03 NOTE — Progress Notes (Signed)
 [facility]   [facility]   Medical Staff Progress Note     [name]  MEDICAL RECORD NUMBER:  [id number]  AGE: 82 y.o.   GENDER: female  DOB: Mar 23, 1941  EPISODE DATE:  09/03/2023    Chief complaint and reason for visit:     Chief Complaint   Patient presents for follow up of her recurrent bilateral lower leg venous ulcers     Follow-up      Patient presenting for follow up evaluation of wound(s) per chief complaint.      Subjective and ROS:  [name],  a 82 yo female with history of DM 2; HTN, CVA CHF, Parkinson's, delusion, chronic asthma, alleged dementia, CRF and chronic venous insufficiency of the lower legs, returns to the [facility] at the request of her PCP, Dr Graylon,  for evaluation of her recurrent bilateral lower leg venous ulcers.   Patient was seen back in March, 2024 for wound care to her lower legs with venous ulcers. She failed to return for follow up after 3 visits and wounds incompletely healed.       Most recently according to the patient she was hospitalized at a [facility] for CHF and then transferred to a nursing facility ([facility]).   After discharge or during her medical care, she developed new bilateral lower leg venous ulcers spontaneously. She complains of pain but cannot quantitated; has heavy drainage without odor. Has not use any compression stockings of late.  Claims she has some Santyl  at home that was ordered earlier this year but never used.       Has diabetes and unclear about her A1c.  Chart shows A1C of 6.3 on 06/09/23.  Denies smoking.  Patient states she is allergic to Lidocaine  and decline to have it used today.     June 25, 2023-  On follow up, [name] feels worst.  Complains of the itch and pain in both the lower leg. Patient has been taking off her compression stockings off and on, not liking it or just doing it. Comes in without any dressings today.  Has new blisters that are sensitive and pruritic.  She has  taken Benadryl .   Complains of lower legs painful.  Denies any fever.  Has no SOB or chest pains.     July 09, 2023- since last seen, [name]  has had her dressing changed at the Wound Care clinic to help get her wounds cleaned and provide the proper compression she needs.  She had been taking off her compression stocking due to discomfort or confusion.  She is allergic to lidocaine  and unable to mechanically remove the slough.  Use of hydrocortisone  cream and vaseline gauze and ABD and Drawtex have helped with her wound.   She returns with less edematous lower legs and more content.     Follow-up July 16, 2023-patient with bilateral venous stasis edema and dermatitis and open ulcerations but patient with dementia and difficult social situation lives by herself, is on the verge of being discharged from home health due to noncompliance.     July 30, 2023-  On follow up, the patient continues to complain of pain, 15 of 10 .  Nothing new on the social situation.  Still decline any ability to debride the wound.  Denies any fever or chills.       August 13, 2023-  On follow up, [name] continues to complain of pain,  15 of 10 everyday all day.  She is having more drainage with odor; has been having her wraps changed 2 x a week. Did not get gentamicin   topical ointments or  hydrocortisone  cream, stating not called in. Admits to scratching due to itch.  She denies any fever or chills.  She states she is not sure what to do. Talks about dead people in her apartment.  She is looking for another place to live now. Has a son in town. And a daughter in [place] ?     August 27, 2023-  On follow up, [name] returns with same complaints of pain.  However, she rates them as 10 of 10.  She claims she took 4 Tylenol  pills before she came to the [facility] ( 2 at 5 am and 2 just before she came).  Complains of fluid pooling at her ankle and making them itchy.  Claims trying not to scratch.  Unclear if she is actually wearing  her compression, as there are some reports from the [facility] that at times ( nurse's contact with [facility] on their findings) there are no wrapping on her leg when they make their visits.  Patient is inconsistent on whether or not she replaces her wraps or not.  Denies any fever.  Claims she is eating well     September 03, 2023-  [name] returns today with complaints of pain, now a 9 of 10.  She states he continues to create more pain with new ulcers at night when she sleeps.  She is still trying to find a new apartment as she claims all her pain and ulcers started when she moved into this apartment.  She has no fever. Has family members - a son in town and several brothers and sisters in [place]  and one in [place].   As it turns out the nurses only comes to her house once a week, not 2-3 as ordered.      History of Wound Context: Per original history and physical on this patient. Changes in history since last evaluation: none    Medical Decision Making:     Problem List Items Addressed This Visit          Circulatory    Venous insufficiency    Idiopathic chronic venous hypertension of right lower extremity with ulcer (HCC)       Other    Severe obesity (BMI 35.0-39.9) with comorbidity (HCC)    Venous stasis ulcer of left calf with fat layer exposed without varicose veins (HCC) - Primary       Wounds and Treatment Plan:  Bilateral lower legs - Much improvement with less inflammation and intense erythema.  Moderate slough present but to a lesser degree than in the past. Although with pain, the level is less.   She continues to have moderate drainage with moderate odor. Patient comes prepared for debridement and consents to debridement which was performed very gingerly.    Treatment - Debridement is recommended.  Patient agrees.  Claims to be allergic to lidocaine  and thus not used.  With a 5 mm curette, the thick layers of slough and biofilm were ever so gently scraped off the wounds.  Debridement stops when she complains of  pain.  Complaining much less today.  She tolerated better with a good amount of slough removed albeit still some left.  The right side worst than the left.  She declined  various products as used in the past, claiming she did not  like them due to pain, discomfort or allergic to them.  Wound was cleaned with saline solution.    Wound care - Options are limited due to patient's limited permission.  Her level of cognitive ability makes it hard to manage wound.  She has no family members living with her.  Will continue to have her cleanse with baby shampoo. Apply A&D ointment to dry area. Within the wound bed to continue with gentamicin  ointment and 2.5 % Hydrocortisone  cream to the intact skin, cover open areas with Vaseline gauze, top with ABD pads and then a superabsorber.  2 layer compression wrap.  Return to [facility] in one week.   Patient advised not to scratch as she can make wound worst.  She has demonstrated improvement with the meticulous and gentle removal of slough and having her come weekly.    Check in with [facility] to come and see her 2- 3 times a week.    Follow up in 1 week  Mineful of diabetic diet  Delusion /confusion -a  contributing factor to her slow recovery    Other associated diagnoses or problems addressed:  none    Pertinent imaging reviewed including independent interpretation include:  None    Pertinent labs reviewed.   Medical records and review of external note (s) from other providers done as well.    New lab or imaging orders placed:  None     Prescription drug management: N/A     Discussion of management or test interpretation with  patient who comes alone .    Comorbid conditions affecting wound healing: As per PMH which was reviewed.    Risk of complications and/or mortality of patient management:  This patient has a minimal risk of morbidity and mortality from additional diagnostic testing or treatment. This is due to the above conditions affecting wound healing as well as patient and procedure  risk factors. Education and discussion held with patient regarding these disease processes pertinent to wound(s).  Other pertinent decisions include: minor surgery or procedures as below.  The patient's diagnosis or treatment is significantly limited by social determinants of health as noted by:  her mental issue .    Wound 06/18/23 Leg Right Circumferential (Active)   Wound Image    09/03/23 0902   Wound Etiology Venous 09/03/23 1012   Dressing Status New dressing applied 09/03/23 1012   Wound Cleansed Soap and water  09/03/23 0902   Dressing/Treatment Other (comment);Gauze dressing/dressing sponge;ABD 09/03/23 1012   Offloading for Diabetic Foot Ulcers Offloading not required 09/03/23 1012   Wound Length (cm) 13 cm 09/03/23 0902   Wound Width (cm) 32 cm 09/03/23 0902   Wound Depth (cm) 0.2 cm 09/03/23 0902   Wound Surface Area (cm^2) 416 cm^2 09/03/23 0902   Change in Wound Size % (l*w) -7.35 09/03/23 0902   Wound Volume (cm^3) 83.2 cm^3 09/03/23 0902   Wound Healing % -7 09/03/23 0902   Wound Assessment Pink/red;Slough 09/03/23 0902   Drainage Amount Large (50-75% saturated) 09/03/23 0902   Drainage Description Serosanguinous;Yellow;Green 09/03/23 0902   Odor Moderate 09/03/23 0902   Peri-wound Assessment Hemosiderin staining (brown yellow);Dry/flaky 09/03/23 0902   Margins Undefined edges 09/03/23 0902   Wound Thickness Description not for Pressure Injury Full thickness 09/03/23 0902   Number of days: 78       Wound 06/18/23 Leg Left Circumferential (Active)   Wound Image     09/03/23 0902   Wound Etiology Venous 09/03/23 1012   Dressing Status New  dressing applied 09/03/23 1012   Wound Cleansed Soap and water  09/03/23 0902   Dressing/Treatment ABD 09/03/23 1012   Offloading for Diabetic Foot Ulcers Offloading not required 09/03/23 1012   Wound Length (cm) 13.1 cm 09/03/23 0902   Wound Width (cm) 36.5 cm 09/03/23 0902   Wound Depth (cm) 0.2 cm 09/03/23 0902   Wound Surface Area (cm^2) 478.15 cm^2 09/03/23 0902    Change in Wound Size % (l*w) -247.49 09/03/23 0902   Wound Volume (cm^3) 95.63 cm^3 09/03/23 0902   Wound Healing % -595 09/03/23 0902   Wound Assessment Pink/red;Slough 09/03/23 0902   Drainage Amount Large (50-75% saturated) 09/03/23 0902   Drainage Description Serosanguinous;Yellow;Green 09/03/23 0902   Odor Moderate 09/03/23 0902   Peri-wound Assessment Hemosiderin staining (brown yellow);Dry/flaky 09/03/23 0902   Margins Undefined edges 09/03/23 0902   Wound Thickness Description not for Pressure Injury Full thickness 09/03/23 0902   Number of days: 78          Procedures done during this encounter:   Debridement: Excisional Debridement  Indications:  Based on my examination of this patient's wound(s)/ulcer(s) today, debridement is required to promote healing and evaluate the wound base. Risks and benefits discussed with patient who has agreed to proceed.   Performed by: Dagoberto LITTIE Cheney, MD  Consent obtained:  Yes  Time out taken:  Yes  Pain Control: none as patient declined lidocaine     Using curette the wound(s)/ulcer(s) was/were debrided down through and including the removal of epidermis and dermis and subcutaneous     Devitalized Tissue Debrided:  slough and necrotic/eschar  Pre Debridement Measurements:  Are located in the Wound/Ulcer Documentation Flow Sheet  Wound/Ulcer #:  diffuse venous ulcers scalttered within the large areas within the right and left lower legs  Post Debridement Measurements:  Wound/Ulcer Descriptions are Pre Debridement except measurements:  Total Surface Area Debrided:  471 sq cm   Diabetic/Pressure/Non Pressure Ulcers only:  Ulcer: Non-Pressure ulcer, fat layer exposed   Estimated Blood Loss:  Minimal  Hemostasis Achieved:  by pressure  Procedural Pain:  9  / 10   Post Procedural Pain:  9 / 10   Response to treatment:  Well tolerated by patient., With complaints of pain but less of it     TIME: E/M Time spent with patient and/or patient care issues: []  15-20 min  [x]  21-30 min  []   31-44 min  []  45 min or more.   This is above the usual time needed to address patient's chief complaint today: [x]  Yes  []  No  This time includes physician non-face-to-face service time visit on the date of service such as  Preparing to see the patient (eg, review of tests)  Obtaining and/or reviewing separately obtained history  Performing a medically necessary appropriate examination and/or evaluation  Counseling and educating the patient/family/caregiver  Ordering medications, tests, or procedures  Referring and communicating with other health care professionals as needed  Documenting clinical information in the electronic or other health record  Independently interpreting results (not reported separately) and communicating results to the patient/family/caregiver  Care coordination (not reported separately)    Objective:    BP 130/73   Pulse 77   Temp 97.5 F (36.4 C) (Temporal)   Resp 18   Wt Readings from Last 3 Encounters:   07/10/23 98 kg (216 lb 1.6 oz)   06/18/23 99.8 kg (220 lb)   06/09/23 99.9 kg (220 lb 4.8 oz)       PHYSICAL  EXAM  General: Alert and in no acute distress. Normal appearing. Pleasant but delusional.  More willing and conversant. Smiling. Obese  Skin: Warm and dry, no rash  Head: Normocephalic and atraumatic  Eyes: Extraocular eye movements intact, conjunctivae normal, and sclera anicteric  ENT: Hearing grossly normal bilaterally. Normal appearance  Respiratory:no respiratory distress  GI: Abdomen non-tender and benign  Musculoskeletal: Baseline range of motion in joints. Nontender calves. No cyanosis. Edema  left with 2+ and right 1-2+ .  Bilateral lower leg wounds - much improvement and less angry with less inflammation, erythema.  Still with lots of slough although less and with drainage. See images and descriptions above  Neurologic: Speech normal. At baseline without new focal deficits. Mental status - same appearing normal until she slips into pockets of delusion.     PAST  MEDICAL HISTORY        Diagnosis Date    Arthritis     Asthma     Chronic renal disease, stage III (HCC) [698720] 10/13/2022    Depression     Diabetes (HCC)     Fibromyalgia     Fibromyalgia     Gastroparesis     Hyperlipemia     Hypertension     Neuropathy     Psychotic disorder (HCC)     Stroke (HCC)        PAST SURGICAL HISTORY    Past Surgical History:   Procedure Laterality Date    COLONOSCOPY      HYSTERECTOMY (CERVIX STATUS UNKNOWN)         FAMILY HISTORY    Family History   Problem Relation Age of Onset    Breast Cancer Maternal Grandmother     Cancer Father         lung ca    Hypertension Father     Diabetes Mother     Hypertension Mother        SOCIAL HISTORY    Social History     Tobacco Use    Smoking status: Never     Passive exposure: Never    Smokeless tobacco: Never   Vaping Use    Vaping status: Never Used   Substance Use Topics    Alcohol use: No    Drug use: No       ALLERGIES    Allergies   Allergen Reactions    Iodinated Contrast Media Shortness Of Breath    Statins Myalgia     Lovastatin, pravastatin, simvastatin, atorvastatin, zetia, livalo, lovaza    Adhesive Tape Rash    Ampicillin Rash    Gabapentin Nausea And Vomiting    Lidocaine  Rash       MEDICATIONS    Current Outpatient Medications on File Prior to Encounter   Medication Sig Dispense Refill    SITagliptin  (JANUVIA ) 100 MG tablet Take 1 tablet by mouth daily 90 tablet 3    pregabalin  (LYRICA ) 300 MG capsule Take 1 capsule by mouth daily for 364 days. DAW BRAND NAME ONLY Max Daily Amount: 300 mg 90 capsule 3    hydrocortisone  2.5 % cream Apply topically topically to areas of pruritus with each change of dressing 3.5 g 1    REPATHA  SURECLICK 140 MG/ML SOAJ INJECT AS DIRECTED EVERY 2 WEEKS 2 mL 11    omeprazole  (PRILOSEC) 40 MG delayed release capsule Take 1 capsule by mouth every morning (before breakfast) 30 capsule 0    lisinopril  (PRINIVIL ;ZESTRIL ) 40 MG tablet Take 1 tablet by mouth daily (Patient  not taking: Reported on 06/18/2023)       quinapril  (ACCUPRIL ) 40 MG tablet Take 1 tablet by mouth daily (Patient not taking: Reported on 04/20/2023)  0    torsemide  (DEMADEX ) 20 MG tablet Take 5 tablets by mouth daily (Patient not taking: Reported on 07/16/2023)      linaclotide  (LINZESS ) 290 MCG CAPS capsule Take 1 capsule by mouth every morning (before breakfast) 30 capsule     beclomethasone (QVAR REDIHALER) 40 MCG/ACT AERB inhaler Inhale 1 puff into the lungs in the morning and 1 puff in the evening. (Patient not taking: Reported on 07/09/2023)      amLODIPine  (NORVASC ) 5 MG tablet Take 1 tablet by mouth daily (Patient not taking: Reported on 04/20/2023)  0    PARoxetine  (PAXIL ) 10 MG tablet Take 1 tablet by mouth daily (Patient not taking: Reported on 06/18/2023)  0    albuterol -ipratropium (COMBIVENT RESPIMAT) 20-100 MCG/ACT AERS inhaler Inhale 1 puff into the lungs every 6 hours (Patient not taking: Reported on 07/16/2023)      insulin  glargine (LANTUS ) 100 UNIT/ML injection vial Inject 14 Units into the skin nightly (Patient not taking: Reported on 06/18/2023) 10 mL 3    insulin  lispro (HUMALOG ) 100 UNIT/ML SOLN injection vial Inject 0-4 Units into the skin 4 times daily (before meals and nightly) (Patient not taking: Reported on 07/16/2023)      linezolid  (ZYVOX ) 600 MG tablet Take 1 tablet by mouth 2 times daily Bid FOR 14 DAYS (Patient not taking: Reported on 03/05/2023)      arformoterol  tartrate 15 MCG/2ML NEBU 15 mcg, budesonide  0.25 MG/2ML SUSP 250 mcg Take 1 Dose by nebulization in the morning and 1 Dose in the evening. (Patient not taking: Reported on 12/18/2022) 60 Units 11    albuterol  sulfate HFA (VENTOLIN  HFA) 108 (90 Base) MCG/ACT inhaler Inhale 2 puffs into the lungs 4 times daily as needed for Wheezing (Patient not taking: Reported on 12/18/2022) 54 g 1    albuterol  (PROVENTIL ) (2.5 MG/3ML) 0.083% nebulizer solution Take 3 mLs by nebulization 4 times daily (Patient not taking: Reported on 04/20/2023) 120 each 3    QUEtiapine  (SEROQUEL ) 25  MG tablet Take 0.5 tablets by mouth nightly (Patient not taking: Reported on 02/16/2023) 60 tablet 3    arformoterol  tartrate (BROVANA ) 15 MCG/2ML NEBU Take 1 ampule by nebulization 2 times daily (Patient not taking: Reported on 12/18/2022) 120 mL 3    budesonide  (PULMICORT ) 0.25 MG/2ML nebulizer suspension Take 2 mLs by nebulization 2 times daily (Patient not taking: Reported on 12/18/2022) 60 each 3    albuterol  sulfate HFA (VENTOLIN  HFA) 108 (90 Base) MCG/ACT inhaler Inhale 2 puffs into the lungs 4 times daily as needed for Wheezing (Patient not taking: Reported on 04/20/2023) 18 g 0    carvedilol  (COREG ) 12.5 MG tablet Take 1 tablet by mouth 2 times daily      Multiple Vitamins-Minerals (THERAPEUTIC MULTIVITAMIN-MINERALS) tablet Take 1 tablet by mouth daily      aspirin  81 MG chewable tablet Take 1 tablet by mouth daily 30 tablet 11    vitamin D3 (CHOLECALCIFEROL ) 125 MCG (5000 UT) TABS tablet Take 1 tablet by mouth daily 30 tablet 11    quinapril  (ACCUPRIL ) 40 MG tablet quinapril  40 mg tablet   TAKE 1 TABLET BY MOUTH EVERY NIGHT FOR HIGH BLOOD PRESSURE (Patient not taking: Reported on 12/19/2022) 90 tablet 3    insulin  lispro, 1 Unit Dial, (HUMALOG /ADMELOG ) 100 UNIT/ML SOPN Humalog  KwikPen (U-100) Insulin  100 unit/mL subcutaneous  INJECT UP TO 50 UNITS UNDER THE SKIN DAILY AS DIRECTED (Patient not taking: Reported on 04/20/2023)      ipratropium (ATROVENT HFA) 17 MCG/ACT inhaler Atrovent HFA 17 mcg/actuation aerosol inhaler (Patient not taking: Reported on 02/16/2023)      linaclotide  (LINZESS ) 290 MCG CAPS capsule Take 1 capsule by mouth as needed (Patient not taking: Reported on 06/09/2023)      olopatadine (PATANOL) 0.1 % ophthalmic solution Apply 2 drops to eye 2 times daily (Patient not taking: Reported on 04/20/2023)       No current facility-administered medications on file prior to encounter.         Written patient dismissal instructions given to patient and signed by patient or POA.         Electronically  signed by Dagoberto LITTIE Cheney, MD on 09/04/2023 at 1:00 PM

## 2023-09-03 NOTE — Flowsheet Note (Signed)
 09/03/23 1012   Right Leg Edema Point of Measurement   Compression Therapy 2 layer compression wrap   Left Leg Edema Point of Measurement   Compression Therapy 2 layer compression wrap   Wound 06/18/23 Leg Right Circumferential   Date First Assessed/Time First Assessed: 06/18/23 0900   Present on Original Admission: Yes  Wound Approximate Age at First Assessment (Weeks): 4 weeks  Primary Wound Type: Venous Ulcer  Location: Leg  Wound Location Orientation: Right  Wound Descript...   Wound Etiology Venous   Dressing Status New dressing applied   Dressing/Treatment Other (comment);Gauze dressing/dressing sponge;ABD  (mupirocin ointment, hydrocortisone  ointment)   Offloading for Diabetic Foot Ulcers Offloading not required   Wound 06/18/23 Leg Left Circumferential   Date First Assessed/Time First Assessed: 06/18/23 0902   Present on Original Admission: Yes  Wound Approximate Age at First Assessment (Weeks): 4 weeks  Primary Wound Type: Venous Ulcer  Location: Leg  Wound Location Orientation: Left  Wound Descripti...   Wound Etiology Venous   Dressing Status New dressing applied   Dressing/Treatment ABD  (A&D, mupirocin ointment, hydrocortisone  cream)   Offloading for Diabetic Foot Ulcers Offloading not required     Discharge Condition: Stable    Pain: 3    Ambulatory Status: Walker    Discharge Destination: home    Transportation:car    Accompanied by: SELF    Discharge instructions reviewed with SELF and copy or written instructions have been provided. All questions/concerns have been addressed at this time.     Multilayer Compression Wrap   (Not Unna) Below the Knee    NAME:  Jamie Bryant  DATE OF BIRTH:  02-23-1941  MEDICAL RECORD NUMBER:  [id number]  DATE:  09/03/2023    Multilayer compression wrap: Applied moisturizing agent to dry skin as needed.   Applied primary and secondary dressing as ordered.  Applied multilayered dressing below the knee to right lower leg.  Applied multilayered dressing below the knee to  left lower leg.  Instructed patient/caregiver not to remove dressing and to keep it clean and dry.   Instructed patient/caregiver on complications to report to provider, such as pain, numbness in toes, heavy drainage, and slippage of dressing.  Instructed patient on purpose of compression dressing and on activity and exercise recommendations.      Electronically signed by Harlene Reasoner, RN on 09/03/2023 at 10:15 AM

## 2023-09-10 ENCOUNTER — Inpatient Hospital Stay: Admit: 2023-09-10 | Discharge: 2023-09-10 | Payer: MEDICARE | Attending: Radiation Oncology | Primary: Internal Medicine

## 2023-09-10 VITALS — BP 145/63 | HR 88 | Temp 97.00000°F | Resp 18

## 2023-09-10 DIAGNOSIS — E11622 Type 2 diabetes mellitus with other skin ulcer: Secondary | ICD-10-CM

## 2023-09-10 NOTE — Flowsheet Note (Signed)
 09/10/23 0947   Right Leg Edema Point of Measurement   Leg circumference 42 cm   Ankle circumference 24 cm   Compression Therapy 2 layer compression wrap   Left Leg Edema Point of Measurement   Leg circumference 44 cm   Ankle circumference 26 cm   Compression Therapy 2 layer compression wrap   Peripheral Vascular   RLE Edema +1;Non-pitting   LLE Edema +2;Pitting   RLE Neurovascular Assessment   Capillary Refill Less than/Equal to 3 seconds   Color Yellow-Brown/Hemosiderin Staining   Temperature Warm   RLE Sensation  Full sensation   R Pedal Pulse +2   LLE Neurovascular Assessment   Capillary Refill Less than/Equal to 3 seconds   Color Yellow-Brown/Hemosiderin Staining   Temperature Warm   LLE Sensation  Full sensation   L Pedal Pulse +2   Wound 06/18/23 Leg Right Circumferential   Date First Assessed/Time First Assessed: 06/18/23 0900   Present on Original Admission: Yes  Wound Approximate Age at First Assessment (Weeks): 4 weeks  Primary Wound Type: Venous Ulcer  Location: Leg  Wound Location Orientation: Right  Wound Descript...   Wound Image    Wound Etiology Venous   Dressing Status Old drainage noted   Wound Cleansed Soap and water    Offloading for Diabetic Foot Ulcers Offloading not required   Wound Length (cm) 13 cm   Wound Width (cm) 34.5 cm   Wound Depth (cm) 0.2 cm   Wound Surface Area (cm^2) 448.5 cm^2   Change in Wound Size % (l*w) -15.74   Wound Volume (cm^3) 89.7 cm^3   Wound Healing % -16   Wound Assessment Pink/red   Drainage Amount Large (50-75% saturated)   Drainage Description Serosanguinous;Green   Odor Mild   Peri-wound Assessment Hemosiderin staining (brown yellow)   Margins Undefined edges   Wound Thickness Description not for Pressure Injury Full thickness   Wound 06/18/23 Leg Left Circumferential   Date First Assessed/Time First Assessed: 06/18/23 0902   Present on Original Admission: Yes  Wound Approximate Age at First Assessment (Weeks): 4 weeks  Primary Wound Type: Venous Ulcer   Location: Leg  Wound Location Orientation: Left  Wound Descripti...   Wound Image    Wound Etiology Venous   Dressing Status Old drainage noted   Wound Cleansed Soap and water    Offloading for Diabetic Foot Ulcers Offloading not required   Wound Length (cm) 11 cm   Wound Width (cm) 28.5 cm   Wound Depth (cm) 0.2 cm   Wound Surface Area (cm^2) 313.5 cm^2   Change in Wound Size % (l*w) -127.83   Wound Volume (cm^3) 62.7 cm^3   Wound Healing % -356   Wound Assessment Pink/red;Slough   Drainage Amount Large (50-75% saturated)   Drainage Description Serosanguinous;Green   Odor Moderate   Peri-wound Assessment Hemosiderin staining (brown yellow)   Margins Undefined edges   Wound Thickness Description not for Pressure Injury Full thickness   Pain Assessment   Pain Assessment 0-10   Pain Level 7   Patient's Stated Pain Goal 0 - No pain   Pain Location Leg   Pain Orientation Right;Left   Pain Descriptors Aching   BP (!) 145/63   Pulse 88   Temp 97 F (36.1 C) (Temporal)   Resp 18

## 2023-09-10 NOTE — Flowsheet Note (Signed)
 09/10/23 1127   Right Leg Edema Point of Measurement   Compression Therapy 2 layer compression wrap   Left Leg Edema Point of Measurement   Compression Therapy 2 layer compression wrap   Wound 06/18/23 Leg Right Circumferential   Date First Assessed/Time First Assessed: 06/18/23 0900   Present on Original Admission: Yes  Wound Approximate Age at First Assessment (Weeks): 4 weeks  Primary Wound Type: Venous Ulcer  Location: Leg  Wound Location Orientation: Right  Wound Descript...   Wound Etiology Venous   Dressing Status New dressing applied   Dressing/Treatment ABD;Honey gel/honey paste  (optilock, 2 layer wrap)   Offloading for Diabetic Foot Ulcers Offloading not required   Wound 06/18/23 Leg Left Circumferential   Date First Assessed/Time First Assessed: 06/18/23 0902   Present on Original Admission: Yes  Wound Approximate Age at First Assessment (Weeks): 4 weeks  Primary Wound Type: Venous Ulcer  Location: Leg  Wound Location Orientation: Left  Wound Descripti...   Wound Etiology Venous   Dressing/Treatment ABD;Honey gel/honey paste  (optilock, 2 layer wrap)   Offloading for Diabetic Foot Ulcers Offloading not required     Multilayer Compression Wrap   (Not Unna) Below the Knee    NAME:  Jamie Bryant  DATE OF BIRTH:  07/01/1941  MEDICAL RECORD NUMBER:  [id number]  DATE:  September 10, 2023    Removed old dressing; wash with soap and water , Applied primary and secondary dressing as ordered, Placed compression to right lower leg, Place compression to left lower leg, Instructed patient/caregiver not to remove dressing and to keep it clean and dry, Instructed patient/caregiver on complications to report to provider, such as pain, numbness in toes, heavy drainage, and slippage of dressing, and Instructed patient/caregiver on purpose of compression dressing and on activity and exercise recommendations    Response to treatment: Well tolerated by patient      Electronically signed by Montie Mount Ephraim, RN on 09/10/2023  at 11:28 AM

## 2023-09-10 NOTE — Patient Instructions (Addendum)
 Discharge Instructions for  [facility]  [address]  [place], [place] [address]  Phone: [phone] Fax: [phone]    NAME:  Jamie Bryant  DATE OF BIRTH:  Feb 02, 1941  MEDICAL RECORD NUMBER:  [id number]  DATE:  September 10, 2023    Home [facility]: [facility]    WOUND CARE ORDERS:  Bilateral Leg wounds: Cleanse with baby shampoo. Rinse and pat dry. Apply hydrocortisone  cream to dry/ flaky areas. Apply medihoney gel to wound beds. Cover open areas with ABD pads then superabsorber. Secure with 2- layer compression wrap. Change dressing three times a week. On weeks when visiting the wound clinic, may change twice a week at home.     Activity:  [x]  Elevate leg(s) above the level of the heart when sitting.  [x]  Avoid prolonged standing in one place.   [x]  Do no get dressing/wrap wet.       Dietary:  []  Diet as tolerated      [x]  Diabetic Diet            []  Increase Protein: examples (Meat, cheese, eggs, greek yogurt, fish, nuts)          []  Juven Therapeutic Nutrition Powder  []  Other:       Return Appointment:  [x]  Return Appointment: With Dr. Dagoberto Cheney in 1 weeks.  []  Nurse Visit :   []  Ordered tests:      Electronically signed WARREN DOLLY, RN on 09/10/2023 at 10:49 AM     Wound Care Center Information: Should you experience any significant changes in your wound(s) or have questions about your wound care, please contact the [facility] at [facility] - FRIDAY 8:00 am - 4:30.  If you need help with your wound outside these hours and cannot wait until we are again available, contact your PCP or go to the hospital emergency room.     PLEASE NOTE: IF YOU ARE UNABLE TO OBTAIN WOUND SUPPLIES, CONTINUE TO USE THE SUPPLIES YOU HAVE AVAILABLE UNTIL YOU ARE ABLE TO REACH US . IT IS MOST IMPORTANT TO KEEP THE WOUND COVERED AT ALL TIMES.     Physician Signature:_______________________    Date: ___________ Time:  ____________

## 2023-09-10 NOTE — Progress Notes (Signed)
 [facility]   Wound Care and [facility] Center   Medical Staff Progress Note     Jamie Bryant  MEDICAL RECORD NUMBER:  [id number]  AGE: 82 y.o.   GENDER: female  DOB: 03-22-1941  EPISODE DATE:  09/10/2023    Chief complaint and reason for visit:     Chief Complaint   Patient presents for follow up of her recurrent bilateral lower leg venous ulcers     wound     Wound care      Patient presenting for follow up evaluation of wound(s) per chief complaint.      Subjective and ROS:  [name],  a 82 yo female with history of DM 2; HTN, CVA CHF, Parkinson's, delusion, chronic asthma, alleged dementia, CRF and chronic venous insufficiency of the lower legs, returns to the [facility] at the request of her PCP, Dr Graylon,  for evaluation of her recurrent bilateral lower leg venous ulcers.   Patient was seen back in March, 2024 for wound care to her lower legs with venous ulcers. She failed to return for follow up after 3 visits and wounds incompletely healed.       Most recently according to the patient she was hospitalized at a [facility] hospital for CHF and then transferred to a nursing facility ([facility]).   After discharge or during her medical care, she developed new bilateral lower leg venous ulcers spontaneously. She complains of pain but cannot quantitated; has heavy drainage without odor. Has not use any compression stockings of late.  Claims she has some Santyl  at home that was ordered earlier this year but never used.       Has diabetes and unclear about her A1c.  Chart shows A1C of 6.3 on 06/09/23.  Denies smoking.  Patient states she is allergic to Lidocaine  and decline to have it used today.     June 25, 2023-  On follow up, [name] feels worst.  Complains of the itch and pain in both the lower leg. Patient has been taking off her compression stockings off and on, not liking it or just doing it. Comes in without any dressings today.  Has new blisters that are sensitive and  pruritic.  She has taken Benadryl .   Complains of lower legs painful.  Denies any fever.  Has no SOB or chest pains.     July 09, 2023- since last seen, [name]  has had her dressing changed at the Wound Care clinic to help get her wounds cleaned and provide the proper compression she needs.  She had been taking off her compression stocking due to discomfort or confusion.  She is allergic to lidocaine  and unable to mechanically remove the slough.  Use of hydrocortisone  cream and vaseline gauze and ABD and Drawtex have helped with her wound.   She returns with less edematous lower legs and more content.     Follow-up July 16, 2023-patient with bilateral venous stasis edema and dermatitis and open ulcerations but patient with dementia and difficult social situation lives by herself, is on the verge of being discharged from home health due to noncompliance.     July 30, 2023-  On follow up, the patient continues to complain of pain, 15 of 10 .  Nothing new on the social situation.  Still decline any ability to debride the wound.  Denies any fever or chills.       August 13, 2023-  On follow up, Ms  Jamie Bryant continues to complain of pain, 15 of 10 everyday all day.  She is having more drainage with odor; has been having her wraps changed 2 x a week. Did not get gentamicin   topical ointments or  hydrocortisone  cream, stating not called in. Admits to scratching due to itch.  She denies any fever or chills.  She states she is not sure what to do. Talks about dead people in her apartment.  She is looking for another place to live now. Has a son in town. And a daughter in [place] ?     August 27, 2023-  On follow up, [name] returns with same complaints of pain.  However, she rates them as 10 of 10.  She claims she took 4 Tylenol  pills before she came to the [facility] ( 2 at 5 am and 2 just before she came).  Complains of fluid pooling at her ankle and making them itchy.  Claims trying not to scratch.  Unclear if she is  actually wearing her compression, as there are some reports from the [facility] that at times ( nurse's contact with [facility] on their findings) there are no wrapping on her leg when they make their visits.  Patient is inconsistent on whether or not she replaces her wraps or not.  Denies any fever.  Claims she is eating well    September 10, 2023-   On follow up, [name] returns with still pain but now an 8 of 10 albeit still a pain.  She continues with her delusion that he continues to inflict pain and make the wound worse.  She denies any fever.  Still complains of itch in the upper calf.      History of Wound Context: Per original history and physical on this patient. Changes in history since last evaluation: none    Medical Decision Making:     Problem List Items Addressed This Visit          Circulatory    Venous insufficiency    Idiopathic chronic venous hypertension of right lower extremity with ulcer (HCC)       Other    Severe obesity (BMI 35.0-39.9) with comorbidity (HCC)    Venous stasis ulcer of left calf with fat layer exposed without varicose veins (HCC) - Primary       Wounds and Treatment Plan:  Bilateral lower legs - Continued improvement with less inflammation and intensity.   Moderate slough still present but less.  Moderate drainage with odor.    Treatment - Debridement is recommended.  Patient agrees, consents.  Claims to be allergic to lidocaine  and thus not used.  With a 5 mm curette, the thick layers of slough and biofilm were ever so gently scraped off the wounds.  Debridement stops when she complains of pain.  Complaining much less today.  She tolerated better with a good amount of slough removed.  She declined  various products as used in the past, claiming she did not like them due to pain, but agrees to try medihoney again.  Wound was cleaned with saline solution.    Wound care -  Her level of cognitive ability makes it hard to manage wound.  She has no family members living with her.  Will  continue to have her cleanse with baby shampoo.  Apply hydrocortisone  cream for her itch.  Apply meihoney gel to the wound beds.  Cover with ABD pads with superabsorber. 2 layer compression wrap.    Patient advised not  to scratch as she can make wound worst.  She has demonstrated improvement with the meticulous and gentle removal of slough and having her come weekly.    Check in with [facility] to come and see her 2- 3 times a week.    Follow up in 1 week  Mindful of diabetic diet  Delusion /confusion -a  contributing factor to her slow recovery    Other associated diagnoses or problems addressed:  none    Pertinent imaging reviewed including independent interpretation include:  None    Pertinent labs reviewed.   Medical records and review of external note (s) from other providers done as well.    New lab or imaging orders placed:  None     Prescription drug management: N/A     Discussion of management or test interpretation with  patient who comes alone .    Comorbid conditions affecting wound healing: As per PMH which was reviewed.    Risk of complications and/or mortality of patient management:  This patient has a minimal risk of morbidity and mortality from additional diagnostic testing or treatment. This is due to the above conditions affecting wound healing as well as patient and procedure risk factors. Education and discussion held with patient regarding these disease processes pertinent to wound(s).  Other pertinent decisions include: minor surgery or procedures as below.  The patient's diagnosis or treatment is  significantly limited by social determinants of health as noted by:  mental issue .    Wound 06/18/23 Leg Right Circumferential (Active)   Wound Image   09/10/23 0947   Wound Etiology Venous 09/10/23 1127   Dressing Status New dressing applied 09/10/23 1127   Wound Cleansed Soap and water  09/10/23 0947   Dressing/Treatment ABD;Honey gel/honey paste 09/10/23 1127   Offloading for Diabetic Foot Ulcers  Offloading not required 09/10/23 1127   Wound Length (cm) 13 cm 09/10/23 0947   Wound Width (cm) 34.5 cm 09/10/23 0947   Wound Depth (cm) 0.2 cm 09/10/23 0947   Wound Surface Area (cm^2) 448.5 cm^2 09/10/23 0947   Change in Wound Size % (l*w) -15.74 09/10/23 0947   Wound Volume (cm^3) 89.7 cm^3 09/10/23 0947   Wound Healing % -16 09/10/23 0947   Wound Assessment Pink/red 09/10/23 0947   Drainage Amount Large (50-75% saturated) 09/10/23 0947   Drainage Description Serosanguinous;Green 09/10/23 0947   Odor Mild 09/10/23 0947   Peri-wound Assessment Hemosiderin staining (brown yellow) 09/10/23 0947   Margins Undefined edges 09/10/23 0947   Wound Thickness Description not for Pressure Injury Full thickness 09/10/23 0947   Number of days: 87       Wound 06/18/23 Leg Left Circumferential (Active)   Wound Image   09/10/23 0947   Wound Etiology Venous 09/10/23 1127   Dressing Status Old drainage noted 09/10/23 0947   Wound Cleansed Soap and water  09/10/23 0947   Dressing/Treatment ABD;Honey gel/honey paste 09/10/23 1127   Offloading for Diabetic Foot Ulcers Offloading not required 09/10/23 1127   Wound Length (cm) 11 cm 09/10/23 0947   Wound Width (cm) 28.5 cm 09/10/23 0947   Wound Depth (cm) 0.2 cm 09/10/23 0947   Wound Surface Area (cm^2) 313.5 cm^2 09/10/23 0947   Change in Wound Size % (l*w) -127.83 09/10/23 0947   Wound Volume (cm^3) 62.7 cm^3 09/10/23 0947   Wound Healing % -356 09/10/23 0947   Wound Assessment Pink/red;Slough 09/10/23 0947   Drainage Amount Large (50-75% saturated) 09/10/23 0947   Drainage Description Serosanguinous;Green 09/10/23 9052  Odor Moderate 09/10/23 0947   Peri-wound Assessment Hemosiderin staining (brown yellow) 09/10/23 0947   Margins Undefined edges 09/10/23 0947   Wound Thickness Description not for Pressure Injury Full thickness 09/10/23 0947   Number of days: 87          Procedures done during this encounter:   Debridement: Excisional Debridement  Indications:  Based on my  examination of this patient's wound(s)/ulcer(s) today, debridement is required to promote healing and evaluate the wound base. Risks and benefits discussed with patient who has agreed to proceed.   Performed by: Dagoberto LITTIE Cheney, MD  Consent obtained:  Yes  Time out taken:  Yes  Pain Control:   patient allergic to lidocaine   Using curette the wound(s)/ulcer(s) was/were debrided down through and including the removal of epidermis, dermis, and subcutaneous tissue.      Devitalized Tissue Debrided:  slough and necrotic/eschar  Pre Debridement Measurements:  Are located in the Wound/Ulcer Documentation Flow Sheet  Wound/Ulcer #:  bilateral leg with diffuse ulcers scattered all over  Post Debridement Measurements:  Wound/Ulcer Descriptions are Pre Debridement except measurements:  Total Surface Area Debrided:  342.4 sq cm   Diabetic/Pressure/Non Pressure Ulcers only:  Ulcer: Non-Pressure ulcer, fat layer exposed   Estimated Blood Loss:  Minimal  Hemostasis Achieved:  by pressure  Procedural Pain:  8  / 10   Post Procedural Pain:  8 / 10   Response to treatment:  Poorly tolerated by patient., With complaints of pain.     TIME: E/M Time spent with patient and/or patient care issues: []  15-20 min  [x]  21-30 min  []  31-44 min  []  45 min or more.   This is above the usual time needed to address patient's chief complaint today: [x]  Yes  []  No  This time includes physician non-face-to-face service time visit on the date of service such as  Preparing to see the patient (eg, review of tests)  Obtaining and/or reviewing separately obtained history  Performing a medically necessary appropriate examination and/or evaluation  Counseling and educating the patient/family/caregiver  Ordering medications, tests, or procedures  Referring and communicating with other health care professionals as needed  Documenting clinical information in the electronic or other health record  Independently interpreting results (not reported separately) and  communicating results to the patient/family/caregiver  Care coordination (not reported separately)    Objective:    BP (!) 145/63   Pulse 88   Temp 97 F (36.1 C) (Temporal)   Resp 18   Wt Readings from Last 3 Encounters:   07/10/23 98 kg (216 lb 1.6 oz)   06/18/23 99.8 kg (220 lb)   06/09/23 99.9 kg (220 lb 4.8 oz)       PHYSICAL EXAM  General: Alert and in no acute distress. Normal appearing  Skin: Warm and dry, no rash  Head: Normocephalic and atraumatic  Eyes: Extraocular eye movements intact, conjunctivae normal, and sclera anicteric  ENT: Hearing grossly normal bilaterally. Normal appearance  Respiratory:  no respiratory distress  Musculoskeletal: Baseline range of motion in joints. Nontender calves. No cyanosis. Edema 2+.  Bilateral lower leg wounds - diffuse with scattered slough. Some areas have demonstrated improvement with epithelialization.  See images and descriptions  Neurologic: Speech normal. At baseline without new focal deficits. Mental status normal or at baseline. Time of delusion    PAST MEDICAL HISTORY        Diagnosis Date    Arthritis     Asthma  Chronic renal disease, stage III [facility]) [[id number]] 10/13/2022    Depression     Diabetes (HCC)     Fibromyalgia     Fibromyalgia     Gastroparesis     Hyperlipemia     Hypertension     Neuropathy     Psychotic disorder (HCC)     Stroke (HCC)        PAST SURGICAL HISTORY    Past Surgical History:   Procedure Laterality Date    COLONOSCOPY      HYSTERECTOMY (CERVIX STATUS UNKNOWN)         FAMILY HISTORY    Family History   Problem Relation Age of Onset    Breast Cancer Maternal Grandmother     Cancer Father         lung ca    Hypertension Father     Diabetes Mother     Hypertension Mother        SOCIAL HISTORY    Social History     Tobacco Use    Smoking status: Never     Passive exposure: Never    Smokeless tobacco: Never   Vaping Use    Vaping status: Never Used   Substance Use Topics    Alcohol use: No    Drug use: No        ALLERGIES    Allergies   Allergen Reactions    Iodinated Contrast Media Shortness Of Breath    Statins Myalgia     Lovastatin, pravastatin, simvastatin, atorvastatin, zetia, livalo, lovaza    Adhesive Tape Rash    Ampicillin Rash    Gabapentin Nausea And Vomiting    Lidocaine  Rash       MEDICATIONS    Current Outpatient Medications on File Prior to Encounter   Medication Sig Dispense Refill    hydrocortisone  2.5 % cream Apply topically to affected area with each wound dressing change 20 g 1    SITagliptin  (JANUVIA ) 100 MG tablet Take 1 tablet by mouth daily 90 tablet 3    pregabalin  (LYRICA ) 300 MG capsule Take 1 capsule by mouth daily for 364 days. DAW BRAND NAME ONLY Max Daily Amount: 300 mg 90 capsule 3    hydrocortisone  2.5 % cream Apply topically topically to areas of pruritus with each change of dressing 3.5 g 1    REPATHA  SURECLICK 140 MG/ML SOAJ INJECT AS DIRECTED EVERY 2 WEEKS 2 mL 11    torsemide  (DEMADEX ) 20 MG tablet Take 5 tablets by mouth daily      linaclotide  (LINZESS ) 290 MCG CAPS capsule Take 1 capsule by mouth every morning (before breakfast) 30 capsule     amLODIPine  (NORVASC ) 5 MG tablet Take 1 tablet by mouth daily  0    insulin  glargine (LANTUS ) 100 UNIT/ML injection vial Inject 14 Units into the skin nightly 10 mL 3    QUEtiapine  (SEROQUEL ) 25 MG tablet Take 0.5 tablets by mouth nightly 60 tablet 3    Multiple Vitamins-Minerals (THERAPEUTIC MULTIVITAMIN-MINERALS) tablet Take 1 tablet by mouth daily      aspirin  81 MG chewable tablet Take 1 tablet by mouth daily 30 tablet 11    vitamin D3 (CHOLECALCIFEROL ) 125 MCG (5000 UT) TABS tablet Take 1 tablet by mouth daily 30 tablet 11    omeprazole  (PRILOSEC) 40 MG delayed release capsule Take 1 capsule by mouth every morning (before breakfast) (Patient not taking: Reported on 09/10/2023) 30 capsule 0    lisinopril  (PRINIVIL ;ZESTRIL ) 40 MG tablet Take 1 tablet  by mouth daily (Patient not taking: Reported on 06/18/2023)      quinapril  (ACCUPRIL ) 40  MG tablet Take 1 tablet by mouth daily (Patient not taking: Reported on 04/20/2023)  0    beclomethasone (QVAR REDIHALER) 40 MCG/ACT AERB inhaler Inhale 1 puff into the lungs in the morning and 1 puff in the evening. (Patient not taking: Reported on 07/09/2023)      PARoxetine  (PAXIL ) 10 MG tablet Take 1 tablet by mouth daily (Patient not taking: Reported on 06/18/2023)  0    albuterol -ipratropium (COMBIVENT RESPIMAT) 20-100 MCG/ACT AERS inhaler Inhale 1 puff into the lungs every 6 hours (Patient not taking: Reported on 07/16/2023)      insulin  lispro (HUMALOG ) 100 UNIT/ML SOLN injection vial Inject 0-4 Units into the skin 4 times daily (before meals and nightly) (Patient not taking: Reported on 07/16/2023)      linezolid  (ZYVOX ) 600 MG tablet Take 1 tablet by mouth 2 times daily Bid FOR 14 DAYS (Patient not taking: Reported on 03/05/2023)      arformoterol  tartrate 15 MCG/2ML NEBU 15 mcg, budesonide  0.25 MG/2ML SUSP 250 mcg Take 1 Dose by nebulization in the morning and 1 Dose in the evening. (Patient not taking: Reported on 12/18/2022) 60 Units 11    albuterol  sulfate HFA (VENTOLIN  HFA) 108 (90 Base) MCG/ACT inhaler Inhale 2 puffs into the lungs 4 times daily as needed for Wheezing (Patient not taking: Reported on 12/18/2022) 54 g 1    albuterol  (PROVENTIL ) (2.5 MG/3ML) 0.083% nebulizer solution Take 3 mLs by nebulization 4 times daily (Patient not taking: Reported on 04/20/2023) 120 each 3    arformoterol  tartrate (BROVANA ) 15 MCG/2ML NEBU Take 1 ampule by nebulization 2 times daily (Patient not taking: Reported on 12/18/2022) 120 mL 3    budesonide  (PULMICORT ) 0.25 MG/2ML nebulizer suspension Take 2 mLs by nebulization 2 times daily (Patient not taking: Reported on 12/18/2022) 60 each 3    albuterol  sulfate HFA (VENTOLIN  HFA) 108 (90 Base) MCG/ACT inhaler Inhale 2 puffs into the lungs 4 times daily as needed for Wheezing (Patient not taking: Reported on 04/20/2023) 18 g 0    carvedilol  (COREG ) 12.5 MG tablet Take 1  tablet by mouth 2 times daily      quinapril  (ACCUPRIL ) 40 MG tablet quinapril  40 mg tablet   TAKE 1 TABLET BY MOUTH EVERY NIGHT FOR HIGH BLOOD PRESSURE (Patient not taking: Reported on 12/19/2022) 90 tablet 3    insulin  lispro, 1 Unit Dial, (HUMALOG /ADMELOG ) 100 UNIT/ML SOPN Humalog  KwikPen (U-100) Insulin  100 unit/mL subcutaneous   INJECT UP TO 50 UNITS UNDER THE SKIN DAILY AS DIRECTED (Patient not taking: Reported on 04/20/2023)      ipratropium (ATROVENT HFA) 17 MCG/ACT inhaler Atrovent HFA 17 mcg/actuation aerosol inhaler (Patient not taking: Reported on 02/16/2023)      linaclotide  (LINZESS ) 290 MCG CAPS capsule Take 1 capsule by mouth as needed (Patient not taking: Reported on 06/09/2023)      olopatadine (PATANOL) 0.1 % ophthalmic solution Apply 2 drops to eye 2 times daily (Patient not taking: Reported on 04/20/2023)       No current facility-administered medications on file prior to encounter.         Written patient dismissal instructions given to patient and signed by patient or POA.         Electronically signed by Dagoberto LITTIE Cheney, MD on 09/13/2023 at 8:47 PM

## 2023-09-14 ENCOUNTER — Ambulatory Visit: Admit: 2023-09-14 | Discharge: 2023-09-14 | Payer: MEDICARE | Attending: Internal Medicine | Primary: Internal Medicine

## 2023-09-14 VITALS — BP 150/64 | HR 94 | Temp 98.10000°F | Resp 18 | Ht 64.0 in | Wt 220.5 lb

## 2023-09-14 DIAGNOSIS — L97222 Non-pressure chronic ulcer of left calf with fat layer exposed: Secondary | ICD-10-CM

## 2023-09-14 NOTE — Progress Notes (Signed)
 Chief Complaint   Patient presents with    Medicare AWV     Patient here for Medicare Annual Wellness Exam.      Have you been to the ER, urgent care clinic since your last visit?  Hospitalized since your last visit?    NO    "Have you seen or consulted any other health care providers outside our system since your last visit?"    NO        Click Here for Release of Records Request

## 2023-09-14 NOTE — Care Coordination (Signed)
 Ambulatory Care Coordination Note     09/14/2023 3:13 PM     patient outreach attempt by this ACM today to perform care management follow up . ACM was unable to reach the patient by telephone today; left voice message requesting a return phone call to this ACM.     Patient graduated from the High Risk Care Management program on 09/14/2023.  Patient has the ability to self manage at this time..  Care management goals have been completed. No further Ambulatory Care Manager follow up scheduled.

## 2023-09-17 ENCOUNTER — Inpatient Hospital Stay: Admit: 2023-09-17 | Discharge: 2023-09-17 | Payer: MEDICARE | Attending: Radiation Oncology | Primary: Internal Medicine

## 2023-09-17 DIAGNOSIS — I87333 Chronic venous hypertension (idiopathic) with ulcer and inflammation of bilateral lower extremity: Secondary | ICD-10-CM

## 2023-09-17 DIAGNOSIS — I872 Venous insufficiency (chronic) (peripheral): Secondary | ICD-10-CM

## 2023-09-17 NOTE — Flowsheet Note (Signed)
 09/17/23 1112   Right Leg Edema Point of Measurement   Leg circumference 42.5 cm   Ankle circumference 24 cm   Compression Therapy 2 layer compression wrap   Left Leg Edema Point of Measurement   Leg circumference 44.5 cm   Ankle circumference 26 cm   Compression Therapy 2 layer compression wrap   Peripheral Vascular   RLE Edema +1;Pitting   LLE Edema +2;Pitting   RLE Neurovascular Assessment   Capillary Refill Less than/Equal to 3 seconds   Color Yellow-Brown/Hemosiderin Staining   Temperature Warm   RLE Sensation  Full sensation   R Pedal Pulse +2   LLE Neurovascular Assessment   Capillary Refill Less than/Equal to 3 seconds   Color Yellow-Brown/Hemosiderin Staining   Temperature Warm   LLE Sensation  Full sensation   L Pedal Pulse +2   Wound 06/18/23 Leg Right Circumferential   Date First Assessed/Time First Assessed: 06/18/23 0900   Present on Original Admission: Yes  Wound Approximate Age at First Assessment (Weeks): 4 weeks  Primary Wound Type: Venous Ulcer  Location: Leg  Wound Location Orientation: Right  Wound Descript...   Wound Image     Wound Etiology Venous   Dressing Status Old drainage noted   Wound Cleansed Soap and water    Offloading for Diabetic Foot Ulcers Offloading not required   Wound Length (cm) 12.7 cm   Wound Width (cm) 30 cm   Wound Depth (cm) 0.2 cm   Wound Surface Area (cm^2) 381 cm^2   Change in Wound Size % (l*w) 1.68   Wound Volume (cm^3) 76.2 cm^3   Wound Healing % 2   Wound Assessment Pink/red;Slough   Drainage Amount Large (50-75% saturated)   Drainage Description Serosanguinous   Odor Mild   Peri-wound Assessment Hemosiderin staining (brown yellow)   Margins Undefined edges   Wound Thickness Description not for Pressure Injury Full thickness   Wound 06/18/23 Leg Left Circumferential   Date First Assessed/Time First Assessed: 06/18/23 0902   Present on Original Admission: Yes  Wound Approximate Age at First Assessment (Weeks): 4 weeks  Primary Wound Type: Venous Ulcer  Location:  Leg  Wound Location Orientation: Left  Wound Descripti...   Wound Image      Wound Etiology Venous   Dressing Status Old drainage noted   Wound Cleansed Soap and water    Offloading for Diabetic Foot Ulcers Offloading not required   Wound Length (cm) 18.5 cm   Wound Width (cm) 39 cm   Wound Depth (cm) 0.2 cm   Wound Surface Area (cm^2) 721.5 cm^2   Change in Wound Size % (l*w) -424.35   Wound Volume (cm^3) 144.3 cm^3   Wound Healing % -949   Wound Assessment Pink/red;Slough   Drainage Amount Large (50-75% saturated)   Drainage Description Serosanguinous   Odor Mild   Peri-wound Assessment Hemosiderin staining (brown yellow)   Margins Undefined edges   Wound Thickness Description not for Pressure Injury Full thickness   Pain Assessment   Pain Assessment 0-10   Pain Level 6   Patient's Stated Pain Goal 0 - No pain   BP (!) 139/59   Pulse 88   Temp 97.9 F (36.6 C) (Temporal)   Resp 18

## 2023-09-17 NOTE — Patient Instructions (Addendum)
 Discharge Instructions for  [facility]  [address]  [place], [place] [address]  Phone: [phone] Fax: [phone]    NAME:  Jamie Bryant  DATE OF BIRTH:  February 05, 1941  MEDICAL RECORD NUMBER:  [id number]  DATE:  September 17, 2023    [facility]: [facility]    WOUND CARE ORDERS:  Bilateral Leg wounds: Cleanse with baby shampoo. Rinse and pat dry. Apply hydrocortisone  cream to dry/ flaky areas. Apply vaseline gauze to wounds. Cover open areas with ABD pads then superabsorber. Secure with 2- layer compression wrap. Change dressing three times a week. On weeks when visiting the wound clinic, may change twice a week at home.         Activity:  [x]  Elevate leg(s) above the level of the heart when sitting.  [x]  Avoid prolonged standing in one place.   [x]  Do no get dressing/wrap wet.       Dietary:  []  Diet as tolerated      [x]  Diabetic Diet            []  Increase Protein: examples (Meat, cheese, eggs, greek yogurt, fish, nuts)          []  Juven Therapeutic Nutrition Powder  []  Other:       Return Appointment:  [x]  Return Appointment: With Dr. Dagoberto Cheney in 1 week.  []  Nurse Visit :   []  Ordered tests:      Electronically signed Montie Caspar, RN on 09/17/2023 at 11:18 AM     Wound Care Center Information: Should you experience any significant changes in your wound(s) or have questions about your wound care, please contact the [facility] at [facility] - FRIDAY 8:00 am - 4:30.  If you need help with your wound outside these hours and cannot wait until we are again available, contact your PCP or go to the hospital emergency room.     PLEASE NOTE: IF YOU ARE UNABLE TO OBTAIN WOUND SUPPLIES, CONTINUE TO USE THE SUPPLIES YOU HAVE AVAILABLE UNTIL YOU ARE ABLE TO REACH US . IT IS MOST IMPORTANT TO KEEP THE WOUND COVERED AT ALL TIMES.     Physician Signature:_______________________    Date: ___________ Time:  ____________

## 2023-09-17 NOTE — Flowsheet Note (Signed)
 09/17/23 1155   Right Leg Edema Point of Measurement   Compression Therapy 2 layer compression wrap   Left Leg Edema Point of Measurement   Compression Therapy 2 layer compression wrap   Wound 06/18/23 Leg Right Circumferential   Date First Assessed/Time First Assessed: 06/18/23 0900   Present on Original Admission: Yes  Wound Approximate Age at First Assessment (Weeks): 4 weeks  Primary Wound Type: Venous Ulcer  Location: Leg  Wound Location Orientation: Right  Wound Descript...   Dressing Status New dressing applied   Dressing/Treatment ABD;Roll gauze;Tape/Soft cloth adhesive tape;Non adherent  (hydrocortisone  cream to intact skin, drawtex)   Wound 06/18/23 Leg Left Circumferential   Date First Assessed/Time First Assessed: 06/18/23 0902   Present on Original Admission: Yes  Wound Approximate Age at First Assessment (Weeks): 4 weeks  Primary Wound Type: Venous Ulcer  Location: Leg  Wound Location Orientation: Left  Wound Descripti...   Dressing Status New dressing applied   Dressing/Treatment ABD;Roll gauze;Tape/Soft cloth adhesive tape;Non adherent  (hydrocortisone  cream to intact skin, drawtex)     Multilayer Compression Wrap   (Not Unna) Below the Knee    NAME:  Jamie Bryant  DATE OF BIRTH:  Jan 25, 1941  MEDICAL RECORD NUMBER:  [id number]  DATE:  September 17, 2023    Removed old dressing; wash with soap and water , Applied primary and secondary dressing as ordered, Placed compression to right lower leg, Place compression to left lower leg, Instructed patient/caregiver not to remove dressing and to keep it clean and dry, Instructed patient/caregiver on complications to report to provider, such as pain, numbness in toes, heavy drainage, and slippage of dressing, and Instructed patient/caregiver on purpose of compression dressing and on activity and exercise recommendations    Response to treatment: Well tolerated by patient      Electronically signed by Powell Bart, RN on 09/17/2023 at 12:44 PM

## 2023-09-17 NOTE — Progress Notes (Signed)
 [facility]   Wound Care and [facility] Therapy Center   Medical Staff Progress Note     Jamie Bryant  MEDICAL RECORD NUMBER:  [id number]  AGE: 82 y.o.   GENDER: female  DOB: 11-30-1941  EPISODE DATE:  09/17/2023    Chief complaint and reason for visit:     Chief Complaint   Jamie Bryant presents for follow up of her recurrent bilateral lower leg venous ulcers     Follow-up     Wounds on both legs      Jamie Bryant presenting for follow up evaluation of wound(s) per chief complaint.      Subjective and ROS: Jamie Bryant,  a 82 yo female with history of DM 2; HTN, CVA CHF, Parkinson's, delusion, chronic asthma, alleged dementia, CRF and chronic venous insufficiency of the lower legs, returns to the [facility] at the request of her PCP, Dr Graylon,  for evaluation of her recurrent bilateral lower leg venous ulcers.   Jamie Bryant was seen back in March, 2024 for wound care to her lower legs with venous ulcers. She failed to return for follow up after 3 visits and wounds incompletely healed.       Most recently according to the Jamie Bryant she was hospitalized at a [facility] hospital for CHF and then transferred to a nursing facility ([facility]).   After discharge or during her medical care, she developed new bilateral lower leg venous ulcers spontaneously. She complains of pain but cannot quantitated; has heavy drainage without odor. Has not use any compression stockings of late.  Claims she has some Santyl  at home that was ordered earlier this year but never used.       Has diabetes and unclear about her A1c.  Chart shows A1C of 6.3 on 06/09/23.  Denies smoking.  Jamie Bryant states she is allergic to Lidocaine  and decline to have it used today.     June 25, 2023-  On follow up, Jamie Bryant feels worst.  Complains of the itch and pain in both the lower leg. Jamie Bryant has been taking off her compression stockings off and on, not liking it or just doing it. Comes in without any dressings today.  Has new blisters that are sensitive  and pruritic.  She has taken Benadryl .   Complains of lower legs painful.  Denies any fever.  Has no SOB or chest pains.     July 09, 2023- since last seen, Jamie Bryant  has had her dressing changed at the Wound Care clinic to help get her wounds cleaned and provide the proper compression she needs.  She had been taking off her compression stocking due to discomfort or confusion.  She is allergic to lidocaine  and unable to mechanically remove the slough.  Use of hydrocortisone  cream and vaseline gauze and ABD and Drawtex have helped with her wound.   She returns with less edematous lower legs and more content.     Follow-up July 16, 2023-Jamie Bryant with bilateral venous stasis edema and dermatitis and open ulcerations but Jamie Bryant with dementia and difficult social situation lives by herself, is on the verge of being discharged from home health due to noncompliance.     July 30, 2023-  On follow up, the Jamie Bryant continues to complain of pain, 15 of 10 .  Nothing new on the social situation.  Still decline any ability to debride the wound.  Denies any fever or chills.       August 13, 2023-  On follow up,  Jamie Bryant continues to complain of pain, 15 of 10 everyday all day.  She is having more drainage with odor; has been having her wraps changed 2 x a week. Did not get gentamicin   topical ointments or  hydrocortisone  cream, stating not called in. Admits to scratching due to itch.  She denies any fever or chills.  She states she is not sure what to do. Talks about dead people in her apartment.  She is looking for another place to live now. Has a son in town. And a daughter in [place] ?     August 27, 2023-  On follow up, Jamie Bryant returns with same complaints of pain.  However, she rates them as 10 of 10.  She claims she took 4 Tylenol  pills before she came to the [facility] ( 2 at 5 am and 2 just before she came).  Complains of fluid pooling at her ankle and making them itchy.  Claims trying not to scratch.  Unclear if  she is actually wearing her compression, as there are some reports from the [facility] that at times ( nurse's contact with [facility] on their findings) there are no wrapping on her leg when they make their visits.  Jamie Bryant is inconsistent on whether or not she replaces her wraps or not.  Denies any fever.  Claims she is eating well     September 10, 2023-   On follow up, Jamie Bryant returns with still pain but now an 8 of 10 albeit still a pain.  She continues with her delusion that he continues to inflict pain and make the wound worse.  She denies any fever.  Still complains of itch in the upper calf.      September 17, 2023- On follow up, Jamie Bryant continues to have pain, 8 to 10 of 10.  Complains the meta Honey is hurting her legs and thus wants no more.  This will be discontinued.  Took Tylenol  prior to her visit today.  Denies any fever.    History of Wound Context: Per original history and physical on this Jamie Bryant. Changes in history since last evaluation: none    Medical Decision Making:     Problem List Items Addressed This Visit          Circulatory    Venous insufficiency    Idiopathic chronic venous hypertension of right lower extremity with ulcer (HCC)       Other    Severe obesity (BMI 35.0-39.9) with comorbidity    Venous stasis ulcer of left calf with fat layer exposed without varicose veins (HCC) - Primary       Wounds and Treatment Plan:  Bilateral lower legs -  About the same as last week.  Continues with having lots of slough within the wounds with some odor.   Overall , less intense erythema.  Moderate drainage. Tender but more tolerant to debridement.   Treatment - Debridement is recommended.  Jamie Bryant agrees, consents.  Claims to be allergic to lidocaine  and thus not used.  With a 5 mm curette, the thick layers of slough and biofilm were ever so gently scraped off the wounds.  Debridement stops when she complains of pain.  She is trying harder to tolerate the debridement, allowing more more slough to be  debrided.  Continues with less complaints.  Wounds were cleaned with saline solution.    Wound care -  Continue to have her cleanse with baby shampoo.  Apply hydrocortisone  cream for her itch.  Stop medihoney. Vaseline gauze to wounds and then  cover with ABD pads with superabsorber. 2 layer compression wrap.  Change 3 times a week. Follow up in 1 week  Jamie Bryant advised no scratching.  She has demonstrated improvement with the meticulous and gentle removal of slough and having her come weekly.    Check in with [facility] to come and see her 2- 3 times a week.    Follow up in 1 week  Mindful of diabetic diet  Delusion /confusion -a  contributing factor to her slow recovery    Other associated diagnoses or problems addressed:  none    Pertinent imaging reviewed including independent interpretation include:  None    Pertinent labs reviewed.   Medical records and review of external note (s) from other providers done as well.    New lab or imaging orders placed:  None     Prescription drug management: N/A     Discussion of management or test interpretation with  Jamie Bryant who comes by herself .    Comorbid conditions affecting wound healing: As per PMH which was reviewed.    Risk of complications and/or mortality of Jamie Bryant management:  This Jamie Bryant has a minimal risk of morbidity and mortality from additional diagnostic testing or treatment. This is due to the above conditions affecting wound healing as well as Jamie Bryant and procedure risk factors. Education and discussion held with Jamie Bryant regarding these disease processes pertinent to wound(s).  Other pertinent decisions include: N/A .  The Jamie Bryant's diagnosis or treatment is  significantly limited by social determinants of health as noted by: N/A .    Wound 06/18/23 Leg Right Circumferential (Active)   Wound Image    10/01/23 1102   Wound Etiology Venous 10/01/23 1102   Dressing Status New dressing applied 10/01/23 1144   Wound Cleansed Soap and water  10/01/23 1102    Dressing/Treatment Xeroform;ABD 10/01/23 1144   Offloading for Diabetic Foot Ulcers Offloading not required 10/01/23 1102   Wound Length (cm) 18.4 cm 10/01/23 1102   Wound Width (cm) 38 cm 10/01/23 1102   Wound Depth (cm) 0.2 cm 10/01/23 1102   Wound Surface Area (cm^2) 699.2 cm^2 10/01/23 1102   Change in Wound Size % (l*w) -80.44 10/01/23 1102   Wound Volume (cm^3) 139.84 cm^3 10/01/23 1102   Wound Healing % -80 10/01/23 1102   Wound Assessment Pink/red;Slough;Pale granulation tissue;Hyper granulation tissue 10/01/23 1102   Drainage Amount Copious (>75 % saturated) 10/01/23 1102   Drainage Description Serosanguinous 10/01/23 1102   Odor Mild 10/01/23 1102   Peri-wound Assessment Hemosiderin staining (brown yellow);Fragile 10/01/23 1102   Margins Undefined edges 10/01/23 1102   Wound Thickness Description not for Pressure Injury Full thickness 10/01/23 1102   Number of days: 109       Wound 06/18/23 Leg Left Circumferential (Active)   Wound Image    10/01/23 1102   Wound Etiology Venous 10/01/23 1102   Dressing Status New dressing applied 10/01/23 1144   Wound Cleansed Soap and water  10/01/23 1102   Dressing/Treatment Xeroform;ABD 10/01/23 1144   Offloading for Diabetic Foot Ulcers Offloading not required 10/01/23 1102   Wound Length (cm) 12.2 cm 10/01/23 1102   Wound Width (cm) 27.5 cm 10/01/23 1102   Wound Depth (cm) 0.1 cm 10/01/23 1102   Wound Surface Area (cm^2) 335.5 cm^2 10/01/23 1102   Change in Wound Size % (l*w) -143.82 10/01/23 1102   Wound Volume (cm^3) 33.55 cm^3 10/01/23 1102   Wound Healing % -144  10/01/23 1102   Wound Assessment Pink/red;Slough;Hyper granulation tissue 10/01/23 1102   Drainage Amount Copious (>75 % saturated) 10/01/23 1102   Drainage Description Serosanguinous 10/01/23 1102   Odor Mild 10/01/23 1102   Peri-wound Assessment Hyperpigmented;Hemosiderin staining (brown yellow) 10/01/23 1102   Margins Undefined edges 10/01/23 1102   Wound Thickness Description not for Pressure Injury  Full thickness 10/01/23 1102   Number of days: 109          Procedures done during this encounter:   Debridement: Excisional Debridement  Indications:  Based on my examination of this Jamie Bryant's wound(s)/ulcer(s) today, debridement is required to promote healing and evaluate the wound base. Risks and benefits discussed with Jamie Bryant who has agreed to proceed.   Performed by: Dagoberto LITTIE Cheney, MD  Consent obtained:  Yes  Time out taken:  Yes  Pain Control:     Using curette the wound(s)/ulcer(s) was/were debrided down through and including the removal of epidermis, dermis, and subcutaneous tissue.      Devitalized Tissue Debrided:  slough  Pre Debridement Measurements:  Are located in the Wound/Ulcer Documentation Flow Sheet  Wound/Ulcer #:  numerous scattered all about in both lower extremities  Post Debridement Measurements:  Wound/Ulcer Descriptions are Pre Debridement except measurements:  Total Surface Area Debrided:  441 sq cm   Diabetic/Pressure/Non Pressure Ulcers only:  Ulcer: Non-Pressure ulcer, fat layer exposed   Estimated Blood Loss:  Minimal  Hemostasis Achieved:  by pressure  Procedural Pain:  9  / 10   Post Procedural Pain:  9 / 10   Response to treatment:  Poorly tolerated by Jamie Bryant.     TIME: E/M Time spent with Jamie Bryant and/or Jamie Bryant care issues: []  15-20 min  [x]  21-30 min  []  31-44 min  []  45 min or more.   This is above the usual time needed to address Jamie Bryant's chief complaint today: [x]  Yes  []  No  This time includes physician non-face-to-face service time visit on the date of service such as  Preparing to see the Jamie Bryant (eg, review of tests)  Obtaining and/or reviewing separately obtained history  Performing a medically necessary appropriate examination and/or evaluation  Counseling and educating the Jamie Bryant/family/caregiver  Ordering medications, tests, or procedures  Referring and communicating with other health care professionals as needed  Documenting clinical information in the electronic or  other health record  Independently interpreting results (not reported separately) and communicating results to the Jamie Bryant/family/caregiver  Care coordination (not reported separately)    Objective:    BP (!) 139/59   Pulse 88   Temp 97.9 F (36.6 C) (Temporal)   Resp 18   Wt Readings from Last 3 Encounters:   09/14/23 100 kg (220 lb 8 oz)   07/10/23 98 kg (216 lb 1.6 oz)   06/18/23 99.8 kg (220 lb)       PHYSICAL EXAM  General: Alert and in no acute distress. Normal appearing  Skin: Warm and dry, no rash  Head: Normocephalic and atraumatic  Eyes: Extraocular eye movements intact, conjunctivae normal, and sclera anicteric  ENT: Hearing grossly normal bilaterally. Normal appearance  Respiratory: no respiratory distress  Musculoskeletal: Baseline range of motion in joints. Nontender calves. No cyanosis. Edema 2+.  Bilateral lower leg wounds- Diffuse scattered slough through out within the crevices.  Some early healing with some epithelization.  Dry dermis noted.   See images and description above  Neurologic: Speech normal. At baseline without new focal deficits. Mental status normal or at baseline.  No  significant delusion.     PAST MEDICAL HISTORY        Diagnosis Date    Arthritis     Asthma     Chronic renal disease, stage III (HCC) [698720] 10/13/2022    Depression     Diabetes (HCC)     Fibromyalgia     Fibromyalgia     Gastroparesis     Hyperlipemia     Hypertension     Neuropathy     Psychotic disorder (HCC)     Stroke (HCC)        PAST SURGICAL HISTORY    Past Surgical History:   Procedure Laterality Date    COLONOSCOPY      HYSTERECTOMY (CERVIX STATUS UNKNOWN)         FAMILY HISTORY    Family History   Problem Relation Age of Onset    Breast Cancer Maternal Grandmother     Cancer Father         lung ca    Hypertension Father     Diabetes Mother     Hypertension Mother        SOCIAL HISTORY    Social History     Tobacco Use    Smoking status: Never     Passive exposure: Never    Smokeless tobacco: Never    Vaping Use    Vaping status: Never Used   Substance Use Topics    Alcohol use: No    Drug use: No       ALLERGIES    Allergies   Allergen Reactions    Iodinated Contrast Media Shortness Of Breath    Statins Myalgia     Lovastatin, pravastatin, simvastatin, atorvastatin, zetia, livalo, lovaza    Adhesive Tape Rash    Ampicillin Rash    Gabapentin Nausea And Vomiting    Lidocaine  Rash       MEDICATIONS    Current Outpatient Medications on File Prior to Encounter   Medication Sig Dispense Refill    Continuous Glucose Sensor (FREESTYLE LIBRE 2 SENSOR) MISC Blood sugar checks before meals and at bedtime and as needed 2 each 11    hydrocortisone  2.5 % cream Apply topically to affected area with each wound dressing change 20 g 1    SITagliptin  (JANUVIA ) 100 MG tablet Take 1 tablet by mouth daily 90 tablet 3    hydrocortisone  2.5 % cream Apply topically topically to areas of pruritus with each change of dressing 3.5 g 1    REPATHA  SURECLICK 140 MG/ML SOAJ INJECT AS DIRECTED EVERY 2 WEEKS 2 mL 11    omeprazole  (PRILOSEC) 40 MG delayed release capsule Take 1 capsule by mouth every morning (before breakfast) (Jamie Bryant not taking: Reported on 09/10/2023) 30 capsule 0    lisinopril  (PRINIVIL ;ZESTRIL ) 40 MG tablet Take 1 tablet by mouth daily (Jamie Bryant not taking: Reported on 06/18/2023)      quinapril  (ACCUPRIL ) 40 MG tablet Take 1 tablet by mouth daily (Jamie Bryant not taking: Reported on 04/20/2023)  0    torsemide  (DEMADEX ) 20 MG tablet Take 5 tablets by mouth daily      linaclotide  (LINZESS ) 290 MCG CAPS capsule Take 1 capsule by mouth every morning (before breakfast) (Jamie Bryant not taking: Reported on 10/01/2023) 30 capsule     beclomethasone (QVAR REDIHALER) 40 MCG/ACT AERB inhaler Inhale 1 puff into the lungs in the morning and 1 puff in the evening. (Jamie Bryant not taking: Reported on 07/09/2023)      amLODIPine  (NORVASC ) 5 MG tablet  Take 1 tablet by mouth daily  0    PARoxetine  (PAXIL ) 10 MG tablet Take 1 tablet by mouth daily  (Jamie Bryant not taking: Reported on 06/18/2023)  0    albuterol -ipratropium (COMBIVENT RESPIMAT) 20-100 MCG/ACT AERS inhaler Inhale 1 puff into the lungs every 6 hours (Jamie Bryant not taking: Reported on 07/16/2023)      insulin  glargine (LANTUS ) 100 UNIT/ML injection vial Inject 14 Units into the skin nightly (Jamie Bryant not taking: Reported on 09/14/2023) 10 mL 3    insulin  lispro (HUMALOG ) 100 UNIT/ML SOLN injection vial Inject 0-4 Units into the skin 4 times daily (before meals and nightly) (Jamie Bryant not taking: Reported on 07/16/2023)      linezolid  (ZYVOX ) 600 MG tablet Take 1 tablet by mouth 2 times daily Bid FOR 14 DAYS (Jamie Bryant not taking: Reported on 03/05/2023)      arformoterol  tartrate 15 MCG/2ML NEBU 15 mcg, budesonide  0.25 MG/2ML SUSP 250 mcg Take 1 Dose by nebulization in the morning and 1 Dose in the evening. (Jamie Bryant not taking: Reported on 12/18/2022) 60 Units 11    albuterol  sulfate HFA (VENTOLIN  HFA) 108 (90 Base) MCG/ACT inhaler Inhale 2 puffs into the lungs 4 times daily as needed for Wheezing (Jamie Bryant not taking: Reported on 12/18/2022) 54 g 1    albuterol  (PROVENTIL ) (2.5 MG/3ML) 0.083% nebulizer solution Take 3 mLs by nebulization 4 times daily (Jamie Bryant not taking: Reported on 04/20/2023) 120 each 3    QUEtiapine  (SEROQUEL ) 25 MG tablet Take 0.5 tablets by mouth nightly (Jamie Bryant not taking: Reported on 09/14/2023) 60 tablet 3    arformoterol  tartrate (BROVANA ) 15 MCG/2ML NEBU Take 1 ampule by nebulization 2 times daily (Jamie Bryant not taking: Reported on 12/18/2022) 120 mL 3    budesonide  (PULMICORT ) 0.25 MG/2ML nebulizer suspension Take 2 mLs by nebulization 2 times daily (Jamie Bryant not taking: Reported on 12/18/2022) 60 each 3    albuterol  sulfate HFA (VENTOLIN  HFA) 108 (90 Base) MCG/ACT inhaler Inhale 2 puffs into the lungs 4 times daily as needed for Wheezing (Jamie Bryant not taking: Reported on 04/20/2023) 18 g 0    carvedilol  (COREG ) 12.5 MG tablet Take 1 tablet by mouth 2 times daily      Multiple  Vitamins-Minerals (THERAPEUTIC MULTIVITAMIN-MINERALS) tablet Take 1 tablet by mouth daily (Jamie Bryant not taking: Reported on 10/01/2023)      aspirin  81 MG chewable tablet Take 1 tablet by mouth daily 30 tablet 11    vitamin D3 (CHOLECALCIFEROL ) 125 MCG (5000 UT) TABS tablet Take 1 tablet by mouth daily 30 tablet 11    quinapril  (ACCUPRIL ) 40 MG tablet quinapril  40 mg tablet   TAKE 1 TABLET BY MOUTH EVERY NIGHT FOR HIGH BLOOD PRESSURE (Jamie Bryant not taking: Reported on 12/19/2022) 90 tablet 3    insulin  lispro, 1 Unit Dial, (HUMALOG /ADMELOG ) 100 UNIT/ML SOPN       ipratropium (ATROVENT HFA) 17 MCG/ACT inhaler Atrovent HFA 17 mcg/actuation aerosol inhaler (Jamie Bryant not taking: Reported on 02/16/2023)      linaclotide  (LINZESS ) 290 MCG CAPS capsule Take 1 capsule by mouth as needed (Jamie Bryant not taking: Reported on 06/09/2023)      olopatadine (PATANOL) 0.1 % ophthalmic solution Apply 2 drops to eye 2 times daily (Jamie Bryant not taking: Reported on 04/20/2023)       No current facility-administered medications on file prior to encounter.         Written Jamie Bryant dismissal instructions given to Jamie Bryant and signed by Jamie Bryant or POA.         Electronically signed by  Dagoberto LITTIE Cheney, MD on 10/05/2023 at 1:13 PM

## 2023-09-22 NOTE — Progress Notes (Signed)
 [facility]  [address]  [address]  [place] [address]  Phone:  [phone]  Fax: [phone]       Chief Complaint   Patient presents with    Medicare AWV     Patient here for Medicare Annual Wellness Exam.    .      SUBJECTIVE:    Jamie Bryant is a 82 y.o. female Comes in for return visit stating that her leg ulcers have improved significantly. She had leg ulcers bilaterally related to stasis dermatitis and chronic venous insufficiency.  With aggressive therapy, improvement has indeed occurred finally.    She has diabetes, which has been reasonably stable. The reason this is important is because without meaningful diabetic control, wound healing would be literally impossible.    She has a past history of obesity, primary hypertension, as well as dyslipidemia.             Current Outpatient Medications   Medication Sig Dispense Refill    Continuous Glucose Sensor (FREESTYLE LIBRE 2 SENSOR) MISC Blood sugar checks before meals and at bedtime and as needed 2 each 11    hydrocortisone  2.5 % cream Apply topically to affected area with each wound dressing change 20 g 1    SITagliptin  (JANUVIA ) 100 MG tablet Take 1 tablet by mouth daily 90 tablet 3    pregabalin  (LYRICA ) 300 MG capsule Take 1 capsule by mouth daily for 364 days. DAW BRAND NAME ONLY Max Daily Amount: 300 mg 90 capsule 3    hydrocortisone  2.5 % cream Apply topically topically to areas of pruritus with each change of dressing 3.5 g 1    REPATHA  SURECLICK 140 MG/ML SOAJ INJECT AS DIRECTED EVERY 2 WEEKS 2 mL 11    torsemide  (DEMADEX ) 20 MG tablet Take 5 tablets by mouth daily      linaclotide  (LINZESS ) 290 MCG CAPS capsule Take 1 capsule by mouth every morning (before breakfast) 30 capsule     amLODIPine  (NORVASC ) 5 MG tablet Take 1 tablet by mouth daily  0    carvedilol  (COREG ) 12.5 MG tablet Take 1 tablet by mouth 2 times daily      Multiple Vitamins-Minerals (THERAPEUTIC MULTIVITAMIN-MINERALS) tablet Take 1 tablet  by mouth daily      aspirin  81 MG chewable tablet Take 1 tablet by mouth daily 30 tablet 11    vitamin D3 (CHOLECALCIFEROL ) 125 MCG (5000 UT) TABS tablet Take 1 tablet by mouth daily 30 tablet 11    omeprazole  (PRILOSEC) 40 MG delayed release capsule Take 1 capsule by mouth every morning (before breakfast) (Patient not taking: Reported on 09/10/2023) 30 capsule 0    lisinopril  (PRINIVIL ;ZESTRIL ) 40 MG tablet Take 1 tablet by mouth daily (Patient not taking: Reported on 06/18/2023)      quinapril  (ACCUPRIL ) 40 MG tablet Take 1 tablet by mouth daily (Patient not taking: Reported on 04/20/2023)  0    beclomethasone (QVAR REDIHALER) 40 MCG/ACT AERB inhaler Inhale 1 puff into the lungs in the morning and 1 puff in the evening. (Patient not taking: Reported on 07/09/2023)      PARoxetine  (PAXIL ) 10 MG tablet Take 1 tablet by mouth daily (Patient not taking: Reported on 06/18/2023)  0    albuterol -ipratropium (COMBIVENT RESPIMAT) 20-100 MCG/ACT AERS inhaler Inhale 1 puff into the lungs every 6 hours (Patient not taking: Reported on 07/16/2023)      insulin  glargine (LANTUS ) 100 UNIT/ML injection vial Inject 14 Units into  the skin nightly (Patient not taking: Reported on 09/14/2023) 10 mL 3    insulin  lispro (HUMALOG ) 100 UNIT/ML SOLN injection vial Inject 0-4 Units into the skin 4 times daily (before meals and nightly) (Patient not taking: Reported on 07/16/2023)      linezolid  (ZYVOX ) 600 MG tablet Take 1 tablet by mouth 2 times daily Bid FOR 14 DAYS (Patient not taking: Reported on 03/05/2023)      arformoterol  tartrate 15 MCG/2ML NEBU 15 mcg, budesonide  0.25 MG/2ML SUSP 250 mcg Take 1 Dose by nebulization in the morning and 1 Dose in the evening. (Patient not taking: Reported on 12/18/2022) 60 Units 11    albuterol  sulfate HFA (VENTOLIN  HFA) 108 (90 Base) MCG/ACT inhaler Inhale 2 puffs into the lungs 4 times daily as needed for Wheezing (Patient not taking: Reported on 12/18/2022) 54 g 1    albuterol  (PROVENTIL ) (2.5 MG/3ML)  0.083% nebulizer solution Take 3 mLs by nebulization 4 times daily (Patient not taking: Reported on 04/20/2023) 120 each 3    QUEtiapine  (SEROQUEL ) 25 MG tablet Take 0.5 tablets by mouth nightly (Patient not taking: Reported on 09/14/2023) 60 tablet 3    arformoterol  tartrate (BROVANA ) 15 MCG/2ML NEBU Take 1 ampule by nebulization 2 times daily (Patient not taking: Reported on 12/18/2022) 120 mL 3    budesonide  (PULMICORT ) 0.25 MG/2ML nebulizer suspension Take 2 mLs by nebulization 2 times daily (Patient not taking: Reported on 12/18/2022) 60 each 3    albuterol  sulfate HFA (VENTOLIN  HFA) 108 (90 Base) MCG/ACT inhaler Inhale 2 puffs into the lungs 4 times daily as needed for Wheezing (Patient not taking: Reported on 04/20/2023) 18 g 0    quinapril  (ACCUPRIL ) 40 MG tablet quinapril  40 mg tablet   TAKE 1 TABLET BY MOUTH EVERY NIGHT FOR HIGH BLOOD PRESSURE (Patient not taking: Reported on 12/19/2022) 90 tablet 3    insulin  lispro, 1 Unit Dial, (HUMALOG /ADMELOG ) 100 UNIT/ML SOPN       ipratropium (ATROVENT HFA) 17 MCG/ACT inhaler Atrovent HFA 17 mcg/actuation aerosol inhaler (Patient not taking: Reported on 02/16/2023)      linaclotide  (LINZESS ) 290 MCG CAPS capsule Take 1 capsule by mouth as needed (Patient not taking: Reported on 06/09/2023)      olopatadine (PATANOL) 0.1 % ophthalmic solution Apply 2 drops to eye 2 times daily (Patient not taking: Reported on 04/20/2023)       No current facility-administered medications for this visit.     Past Medical History:   Diagnosis Date    Arthritis     Asthma     Chronic renal disease, stage III [facility]) [[id number]] 10/13/2022    Depression     Diabetes (HCC)     Fibromyalgia     Fibromyalgia     Gastroparesis     Hyperlipemia     Hypertension     Neuropathy     Psychotic disorder (HCC)     Stroke [facility])      Past Surgical History:   Procedure Laterality Date    COLONOSCOPY      HYSTERECTOMY (CERVIX STATUS UNKNOWN)       Allergies   Allergen Reactions    Iodinated Contrast Media  Shortness Of Breath    Statins Myalgia     Lovastatin, pravastatin, simvastatin, atorvastatin, zetia, livalo, lovaza    Adhesive Tape Rash    Ampicillin Rash    Gabapentin Nausea And Vomiting    Lidocaine  Rash         REVIEW OF SYSTEMS:  General:  negative for - chills or fever  ENT: negative for - headaches, nasal congestion or tinnitus  Respiratory: negative for - cough, hemoptysis, shortness of breath or wheezing  Cardiovascular : negative for - chest pain, edema, palpitations or shortness of breath  Gastrointestinal: negative for - abdominal pain, blood in stools, heartburn or nausea/vomiting  Genito-Urinary: no dysuria, trouble voiding, or hematuria  Musculoskeletal: negative for - gait disturbance, joint pain, joint stiffness or joint swelling  Neurological: no TIA or stroke symptoms  Hematologic: no bruises, no bleeding, no swollen glands  Integument: no lumps, mole changes, nail changes or rash  Endocrine: no malaise/lethargy or unexpected weight changes      Social History     Socioeconomic History    Marital status: Divorced     Spouse name: None    Number of children: 2    Years of education: None    Highest education level: Bachelor's degree (e.g., BA, AB, BS)   Tobacco Use    Smoking status: Never     Passive exposure: Never    Smokeless tobacco: Never   Vaping Use    Vaping status: Never Used   Substance and Sexual Activity    Alcohol use: No    Drug use: No    Sexual activity: Not Currently     Comment: divorced,2 children,retired,living in [facility]   Social History Narrative    Nothing in the past 5 years     Social Determinants of Health     Financial Resource Strain: Low Risk  (06/09/2023)    Overall Financial Resource Strain (CARDIA)     Difficulty of Paying Living Expenses: Not hard at all   Food Insecurity: No Food Insecurity (06/09/2023)    Hunger Vital Sign     Worried About Running Out of Food in the Last Year: Never true     Ran Out of Food in the Last Year: Never true    Transportation Needs: No Transportation Needs (06/09/2023)    PRAPARE - Therapist, Art (Medical): No     Lack of Transportation (Non-Medical): No   Physical Activity: Inactive (09/14/2023)    Exercise Vital Sign     Days of Exercise per Week: 0 days     Minutes of Exercise per Session: 0 min   Stress: No Stress Concern Present (05/26/2023)    [employer] of Occupational Health - Occupational Stress Questionnaire     Feeling of Stress : Only a little   Social Connections: Unknown (05/26/2023)    Social Connection and Isolation Panel [NHANES]     Frequency of Communication with Friends and Family: Once a week     Frequency of Social Gatherings with Friends and Family: Once a week     Attends Religious Services: Never     Database Administrator or Organizations: No     Attends Banker Meetings: Never   Recent Concern: Social Connections - Socially Isolated (05/26/2023)    Social Connection and Isolation Panel [NHANES]     Frequency of Communication with Friends and Family: Once a week     Frequency of Social Gatherings with Friends and Family: Once a week     Attends Religious Services: Never     Database Administrator or Organizations: No     Attends Banker Meetings: Never     Marital Status: Divorced   Catering Manager Violence: Not At Risk (05/26/2023)    Humiliation,  Afraid, Rape, and Kick questionnaire     Fear of Current or Ex-Partner: No     Emotionally Abused: No     Physically Abused: No     Sexually Abused: No   Housing Stability: Low Risk  (06/09/2023)    Housing Stability Vital Sign     Unable to Pay for Housing in the Last Year: No     Number of Places Lived in the Last Year: 1     Unstable Housing in the Last Year: No   Recent Concern: Housing Stability - High Risk (04/08/2023)    Received from [facility]    Housing Stability Vital Sign     Unable to Pay for Housing in the Last Year: Yes     Unstable Housing in the Last Year: No     Family  History   Problem Relation Age of Onset    Breast Cancer Maternal Grandmother     Cancer Father         lung ca    Hypertension Father     Diabetes Mother     Hypertension Mother        OBJECTIVE:    BP (!) 150/64   Pulse 94   Temp 98.1 F (36.7 C) (Oral)   Resp 18   Ht 1.626 m (5' 4)   Wt 100 kg (220 lb 8 oz)   SpO2 94%   BMI 37.85 kg/m   CONSTITUTIONAL: well , well nourished, appears age appropriate  EYES: perrla, eom intact  ENMT:moist mucous membranes, pharynx clear  NECK: supple. Thyroid normal  RESPIRATORY: Chest: clear to ascultation and percussion   CARDIOVASCULAR: Heart: regular rate and rhythm  GASTROINTESTINAL: Abdomen: soft, bowel sounds active  HEMATOLOGIC: no pathological lymph nodes palpated  MUSCULOSKELETAL: Extremities: no edema, pulse 1+   INTEGUMENT: No unusual rashes or suspicious skin lesions noted. Nails appear normal.  NEUROLOGIC: non-focal exam   MENTAL STATUS: alert and oriented, appropriate affect      ASSESSMENT:  1. 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 Non-pressure chronic ulcer of right calf, limited to breakdown of skin (HCC)    3. Venous stasis ulcer of left calf with fat layer exposed without varicose veins (HCC)    4. Venous insufficiency    5. Essential (primary) hypertension    6. Severe obesity (BMI 35.0-39.9) with comorbidity (HCC)    7. Type 2 diabetes mellitus with diabetic neuropathy, without long-term current use of insulin  (HCC)        PLAN:  1. The patient's leg ulcers have improved significantly and she will continue following with wound care.  2. She has chronic venous insufficiency.  This is addressed by compression stockings.  3. Her blood pressure is quite reasonable today.  4. She definitely needs to lose weight, something which she is not doing.  This can be accomplished by eating meals, eliminating snacks and avoiding the consumption of processed carbohydrates.  5. She has diabetes and hopefully her diabetic control is  quite reasonable.  Without diabetic control, healing will be extremely difficult.        No orders of the defined types were placed in this encounter.       Follow-up and Dispositions    Return in about 2 months (around 11/14/2023).             Willma Factor, MD

## 2023-09-23 MED ORDER — FREESTYLE LIBRE 2 SENSOR MISC
11 refills | Status: AC
Start: 2023-09-23 — End: ?

## 2023-09-24 ENCOUNTER — Inpatient Hospital Stay
Admit: 2023-09-24 | Discharge: 2023-09-24 | Payer: Medicare Other | Attending: Radiation Oncology | Primary: Internal Medicine

## 2023-09-24 VITALS — BP 147/73 | HR 89 | Temp 98.90000°F | Resp 18

## 2023-09-24 DIAGNOSIS — I87311 Chronic venous hypertension (idiopathic) with ulcer of right lower extremity: Secondary | ICD-10-CM

## 2023-09-24 NOTE — Patient Instructions (Addendum)
 Discharge Instructions for  [facility]  [address]  [place], [place] [address]  Phone: [phone] Fax: [phone]    NAME:  Jamie Bryant  DATE OF BIRTH:  02-Jan-1941  MEDICAL RECORD NUMBER:  [id number]  DATE:  September 24, 2023    [facility]    WOUND CARE ORDERS:  Bilateral Leg wounds - Cleanse with baby shampoo. Rinse and pat dry. Apply hydrocortisone  cream to dry/ flaky areas. Apply xeroform gauze to wounds. Cover open areas with ABD pads then superabsorber. Secure with 2 layer compression wrap. Change dressing three times a week. On weeks when visiting the wound clinic, may change twice a week at home.       Activity:  [x]  Elevate leg(s) above the level of the heart when sitting.  [x]  Avoid prolonged standing in one place.   [x]  Do no get dressing/wrap wet.       Dietary:  []  Diet as tolerated      [x]  Diabetic Diet            []  Increase Protein: examples (Meat, cheese, eggs, greek yogurt, fish, nuts)          []  Juven Therapeutic Nutrition Powder  []  Other:       Return Appointment:  [x]  Return Appointment: With Dr. Dagoberto Cheney in 1 week.  []  Nurse Visit :   []  Ordered tests:      Electronically signed by Warren Drivers, RN on 09/24/2023 at 11:39 AM     Wound Care Center Information: Should you experience any significant changes in your wound(s) or have questions about your wound care, please contact the [facility] at [facility] - FRIDAY 8:00 am - 4:30.  If you need help with your wound outside these hours and cannot wait until we are again available, contact your PCP or go to the hospital emergency room.     PLEASE NOTE: IF YOU ARE UNABLE TO OBTAIN WOUND SUPPLIES, CONTINUE TO USE THE SUPPLIES YOU HAVE AVAILABLE UNTIL YOU ARE ABLE TO REACH US . IT IS MOST IMPORTANT TO KEEP THE WOUND COVERED AT ALL TIMES.     Physician Signature:_______________________    Date: ___________ Time:  ____________

## 2023-09-24 NOTE — Flowsheet Note (Signed)
 09/24/23 1212   Right Leg Edema Point of Measurement   Compression Therapy 2 layer compression wrap   Left Leg Edema Point of Measurement   Compression Therapy 2 layer compression wrap   Wound 06/18/23 Leg Right Circumferential   Date First Assessed/Time First Assessed: 06/18/23 0900   Present on Original Admission: Yes  Wound Approximate Age at First Assessment (Weeks): 4 weeks  Primary Wound Type: Venous Ulcer  Location: Leg  Wound Location Orientation: Right  Wound Descript...   Dressing Status New dressing applied   Dressing/Treatment Xeroform;ABD  (drawtex; hydrocortisone  cream to intact skin)   Wound 06/18/23 Leg Left Circumferential   Date First Assessed/Time First Assessed: 06/18/23 0902   Present on Original Admission: Yes  Wound Approximate Age at First Assessment (Weeks): 4 weeks  Primary Wound Type: Venous Ulcer  Location: Leg  Wound Location Orientation: Left  Wound Descripti...   Dressing Status New dressing applied   Dressing/Treatment Xeroform;ABD  (drawtex; hydrocortisone  cream to intact skin)     Multilayer Compression Wrap   (Not Unna) Below the Knee    NAME:  Jamie Bryant  DATE OF BIRTH:  1941-08-13  MEDICAL RECORD NUMBER:  [id number]  DATE:  September 24, 2023    Removed old dressing; wash with soap and water , Applied primary and secondary dressing as ordered, Placed compression to right lower leg, Place compression to left lower leg, Instructed patient/caregiver not to remove dressing and to keep it clean and dry, Instructed patient/caregiver on complications to report to provider, such as pain, numbness in toes, heavy drainage, and slippage of dressing, and Instructed patient/caregiver on purpose of compression dressing and on activity and exercise recommendations    Response to treatment: Well tolerated by patient      Electronically signed by Powell Bart, RN on 09/24/2023 at 12:24 PM

## 2023-09-24 NOTE — Progress Notes (Signed)
 [facility]   Wound Care and [facility] Therapy Center   Medical Staff Progress Note     Jamie Bryant  MEDICAL RECORD NUMBER:  [id number]  AGE: 82 y.o.   GENDER: female  DOB: Dec 22, 1941  EPISODE DATE:  09/24/2023    Chief complaint and reason for visit:       Chief Complaint   Patient presents for follow up of her recurrent bilateral lower leg venous ulcers     wound       Wound care      Patient presenting for follow up evaluation of wound(s) per chief complaint.      Subjective and ROS: [name],  a 82 yo female with history of DM 2; HTN, CVA CHF, Parkinson's, delusion, chronic asthma, alleged dementia, CRF and chronic venous insufficiency of the lower legs, returns to the [facility] at the request of her PCP, Jamie Bryant,  for evaluation of her recurrent bilateral lower leg venous ulcers.   Patient was seen back in March, 2024 for wound care to her lower legs with venous ulcers. She failed to return for follow up after 3 visits and wounds incompletely healed.       Most recently according to the patient she was hospitalized at a [facility] hospital for CHF and then transferred to a nursing facility ([facility]).   After discharge or during her medical care, she developed new bilateral lower leg venous ulcers spontaneously. She complains of pain but cannot quantitated; has heavy drainage without odor. Has not use any compression stockings of late.  Claims she has some Santyl  at home that was ordered earlier this year but never used.       Has diabetes and unclear about her A1c.  Chart shows A1C of 6.3 on 06/09/23.  Denies smoking.  Patient states she is allergic to Lidocaine  and decline to have it used today.     June 25, 2023-  On follow up, [name] feels worst.  Complains of the itch and pain in both the lower leg. Patient has been taking off her compression stockings off and on, not liking it or just doing it. Comes in without any dressings today.  Has new blisters that are sensitive and  pruritic.  She has taken Benadryl .   Complains of lower legs painful.  Denies any fever.  Has no SOB or chest pains.     July 09, 2023- since last seen, [name]  has had her dressing changed at the Wound Care clinic to help get her wounds cleaned and provide the proper compression she needs.  She had been taking off her compression stocking due to discomfort or confusion.  She is allergic to lidocaine  and unable to mechanically remove the slough.  Use of hydrocortisone  cream and vaseline gauze and ABD and Drawtex have helped with her wound.   She returns with less edematous lower legs and more content.     Follow-up July 16, 2023-patient with bilateral venous stasis edema and dermatitis and open ulcerations but patient with dementia and difficult social situation lives by herself, is on the verge of being discharged from home health due to noncompliance.     July 30, 2023-  On follow up, the patient continues to complain of pain, 15 of 10 .  Nothing new on the social situation.  Still decline any ability to debride the wound.  Denies any fever or chills.       August 13, 2023-  On  follow up, [name] continues to complain of pain, 15 of 10 everyday all day.  She is having more drainage with odor; has been having her wraps changed 2 x a week. Did not get gentamicin   topical ointments or  hydrocortisone  cream, stating not called in. Admits to scratching due to itch.  She denies any fever or chills.  She states she is not sure what to do. Talks about dead people in her apartment.  She is looking for another place to live now. Has a son in town. And a daughter in [place] ?     August 27, 2023-  On follow up, [name] returns with same complaints of pain.  However, she rates them as 10 of 10.  She claims she took 4 Tylenol  pills before she came to the [facility] ( 2 at 5 am and 2 just before she came).  Complains of fluid pooling at her ankle and making them itchy.  Claims trying not to scratch.  Unclear if she is  actually wearing her compression, as there are some reports from [facility] that at times ( nurse's contact with [facility] on their findings) there are no wrapping on her leg when they make their visits.  Patient is inconsistent on whether or not she replaces her wraps or not.  Denies any fever.  Claims she is eating well     September 10, 2023-   On follow up, [name] returns with still pain but now an 8 of 10 albeit still a pain.  She continues with her delusion that he continues to inflict pain and make the wound worse.  She denies any fever.  Still complains of itch in the upper calf.      September 24, 2023-   On follow up, [name] continues to be the same.  Pain today is a 9 of 10.  Did not like the medihoney that was applied.  Less delusional today.  Son lives in [place] and daughter in [place] .  Saw Jamie Bryant yesterday for wellness follow up.  Patient's last A1C  on 06/09/23 was 6.3.  She took Tylenol  prior to her visit with us  today.    History of Wound Context: Per original history and physical on this patient. 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  Still with moderate slough and drainage with mild odor.    Treatment - Debridement is recommended.  Patient agrees, consents.  Claims to be allergic to lidocaine  and thus not used.  With a 5 mm curette, the thick layers of slough were ever so gently scraped off the wounds.  Less complaints of pain  today, tolerating better although a few rough spots.  Debridement  stops when she complains of pain.  She tolerated better with a good amount of slough removed.  Wound was cleaned with saline solution.  Some areas of dry  dermis.   Wound care -  Her level of cognitive ability makes it hard to manage wound.  She has no family members living with her.  Will continue to have her cleanse with baby shampoo.  Apply hydrocortisone  cream for her itch.  Stop the medihoney.  Apply xeroform to the wounds and cover with ABD pads then superabsorber.   2 layer compression wrap.  Change 3 times a week  Patient advised not to scratch as she can make wound worst.  She continues to demonstrate improvement with the meticulous and gentle removal of slough and having her come weekly.    HH to come and see her 2- 3 times a week.    Follow up in 1 week  Mindful of diabetic diet  Delusion /confusion -a  contributing factor to her slow recovery       Other associated diagnoses or problems addressed:  none    Pertinent imaging reviewed including independent interpretation include:  None    Pertinent labs reviewed.   Medical records and review of external note (s) from other providers done as well.    New lab or imaging orders placed:  None     Prescription drug management: N/A     Discussion of management or test interpretation with  patient who comes alone .    Comorbid conditions affecting wound healing: As per PMH which was reviewed.    Risk of complications and/or mortality of patient management:  This patient has a minimal risk of morbidity and mortality from additional diagnostic testing or treatment. This is due to the above conditions affecting wound healing as well as patient and procedure risk factors. Education and discussion held with patient regarding these disease processes pertinent to wound(s).  Other pertinent decisions include: minor surgery or procedures as below.  The patient's diagnosis or treatment is  significantly limited by social determinants of health as noted by:  mental capacity  .    Wound 06/18/23 Leg Right Circumferential (Active)   Wound Image    09/24/23 1110   Wound Etiology Venous 09/24/23 1110   Dressing Status New dressing applied 09/24/23 1212   Wound Cleansed Soap and water  09/24/23 1110   Dressing/Treatment Xeroform;ABD 09/24/23 1212   Offloading for Diabetic Foot Ulcers Offloading not required 09/24/23 1110   Wound Length (cm) 15 cm 09/24/23 1110   Wound Width (cm) 32 cm 09/24/23 1110   Wound Depth (cm) 0.2 cm 09/24/23 1110   Wound Surface Area (cm^2) 480 cm^2 09/24/23 1110   Change in Wound Size % (l*w) -23.87 09/24/23 1110   Wound Volume (cm^3) 96 cm^3 09/24/23 1110   Wound Healing % -24 09/24/23 1110   Wound Assessment Slough;Pink/red 09/24/23 1110   Drainage Amount Copious (>75 % saturated) 09/24/23 1110   Drainage Description Serosanguinous 09/24/23 1110   Odor Mild 09/24/23 1110   Peri-wound Assessment Hemosiderin staining (brown yellow);Maceration 09/24/23 1110   Margins Undefined edges 09/24/23 1110   Wound Thickness Description not for Pressure Injury Full thickness 09/24/23 1110   Number of days: 99       Wound 06/18/23 Leg Left Circumferential (Active)   Wound Image    09/24/23 1110   Wound Etiology Venous 09/24/23 1110   Dressing Status New dressing applied 09/24/23 1212   Wound Cleansed Soap and water  09/24/23 1110   Dressing/Treatment Xeroform;ABD 09/24/23 1212   Offloading for Diabetic Foot Ulcers Offloading not required 09/24/23 1110   Wound Length (cm) 14 cm 09/24/23 1110  Wound Width (cm) 35 cm 09/24/23 1110   Wound Depth (cm) 0.2 cm 09/24/23 1110   Wound Surface Area (cm^2) 490 cm^2 09/24/23 1110   Change in Wound Size % (l*w) -256.1 09/24/23 1110   Wound Volume (cm^3) 98 cm^3 09/24/23 1110   Wound Healing % -612 09/24/23 1110   Wound Assessment Pink/red;Slough 09/24/23 1110   Drainage Amount Copious (>75 % saturated) 09/24/23 1110   Drainage Description Serosanguinous 09/24/23 1110   Odor Mild 09/24/23 1110   Peri-wound Assessment Hemosiderin staining (brown  yellow);Maceration;Induration 09/24/23 1110   Margins Undefined edges 09/24/23 1110   Wound Thickness Description not for Pressure Injury Full thickness 09/24/23 1110   Number of days: 99          Procedures done during this encounter:   Debridement: Excisional Debridement  Indications:  Based on my examination of this patient's wound(s)/ulcer(s) today, debridement is required to promote healing and evaluate the wound base. Risks and benefits discussed with patient who has agreed to proceed.   Performed by: Dagoberto LITTIE Cheney, MD  Consent obtained:  Yes  Time out taken:  Yes  Pain Control:   none   Using curette the wound(s)/ulcer(s) was/were debrided down through and including the removal of epidermis and dermis.      Devitalized Tissue Debrided:  slough  Pre Debridement Measurements:  Are located in the Wound/Ulcer Documentation Flow Sheet  Wound/Ulcer #:  numerous coving over large areas of both lower leg  Post Debridement Measurements:  Wound/Ulcer Descriptions are Pre Debridement except measurements:  Total Surface Area Debrided:  776 sq cm   Diabetic/Pressure/Non Pressure Ulcers only:  Ulcer: Non-Pressure ulcer, fat layer exposed   Estimated Blood Loss:  Minimal  Hemostasis Achieved:  by pressure  Procedural Pain:  9  / 10   Post Procedural Pain:  9 / 10   Response to treatment:  Well tolerated by patient.but with pain - improving allowing more debridement than before.    TIME: E/M Time spent with patient and/or patient care issues: []  15-20 min  [x]  21-30 min  []  31-44 min  []  45 min or more.   This is above the usual time needed to address patient's chief complaint today: [x]  Yes  []  No  This time includes physician non-face-to-face service time visit on the date of service such as  Preparing to see the patient (eg, review of tests)  Obtaining and/or reviewing separately obtained history  Performing a medically necessary appropriate examination and/or evaluation  Counseling and educating the  patient/family/caregiver  Ordering medications, tests, or procedures  Referring and communicating with other health care professionals as needed  Documenting clinical information in the electronic or other health record  Independently interpreting results (not reported separately) and communicating results to the patient/family/caregiver  Care coordination (not reported separately)    Objective:    BP (!) 147/73   Pulse 89   Temp 98.9 F (37.2 C) (Temporal)   Resp 18   Wt Readings from Last 3 Encounters:   09/14/23 100 kg (220 lb 8 oz)   07/10/23 98 kg (216 lb 1.6 oz)   06/18/23 99.8 kg (220 lb)       PHYSICAL EXAM  General: Alert and in no acute distress. Normal appearing in NAD. Less delusional today. Pleasant. Smiling a little more today; responds to questions and engaging in conversation  Skin: Warm and dry, no rash  Head: Normocephalic and atraumatic  Eyes: Extraocular eye movements intact, conjunctivae normal, and sclera anicteric  ENT: Hearing grossly normal bilaterally. Normal appearance  Respiratory: no respiratory distress  Musculoskeletal: Baseline range of motion in joints. Nontender calves. No cyanosis. Edema 1+  Bilateral lower leg venous ulcers - overall an improvement but still copious amount of slough present.- see images and descriptions above.  Neurologic: Speech normal. At baseline without new focal deficits. Mental status normal or at baseline. coherent    PAST MEDICAL HISTORY        Diagnosis Date    Arthritis     Asthma     Chronic renal disease, stage III (HCC) [698720] 10/13/2022    Depression     Diabetes (HCC)     Fibromyalgia     Fibromyalgia     Gastroparesis     Hyperlipemia     Hypertension     Neuropathy     Psychotic disorder (HCC)     Stroke (HCC)        PAST SURGICAL HISTORY    Past Surgical History:   Procedure Laterality Date    COLONOSCOPY      HYSTERECTOMY (CERVIX STATUS UNKNOWN)         FAMILY HISTORY    Family History   Problem Relation Age of Onset    Breast Cancer  Maternal Grandmother     Cancer Father         lung ca    Hypertension Father     Diabetes Mother     Hypertension Mother        SOCIAL HISTORY    Social History     Tobacco Use    Smoking status: Never     Passive exposure: Never    Smokeless tobacco: Never   Vaping Use    Vaping status: Never Used   Substance Use Topics    Alcohol use: No    Drug use: No       ALLERGIES    Allergies   Allergen Reactions    Iodinated Contrast Media Shortness Of Breath    Statins Myalgia     Lovastatin, pravastatin, simvastatin, atorvastatin, zetia, livalo, lovaza    Adhesive Tape Rash    Ampicillin Rash    Gabapentin Nausea And Vomiting    Lidocaine  Rash       MEDICATIONS    Current Outpatient Medications on File Prior to Encounter   Medication Sig Dispense Refill    Continuous Glucose Sensor (FREESTYLE LIBRE 2 SENSOR) MISC Blood sugar checks before meals and at bedtime and as needed 2 each 11    hydrocortisone  2.5 % cream Apply topically to affected area with each wound dressing change 20 g 1    SITagliptin  (JANUVIA ) 100 MG tablet Take 1 tablet by mouth daily 90 tablet 3    pregabalin  (LYRICA ) 300 MG capsule Take 1 capsule by mouth daily for 364 days. DAW BRAND NAME ONLY Max Daily Amount: 300 mg 90 capsule 3    hydrocortisone  2.5 % cream Apply topically topically to areas of pruritus with each change of dressing 3.5 g 1    REPATHA  SURECLICK 140 MG/ML SOAJ INJECT AS DIRECTED EVERY 2 WEEKS 2 mL 11    omeprazole  (PRILOSEC) 40 MG delayed release capsule Take 1 capsule by mouth every morning (before breakfast) (Patient not taking: Reported on 09/10/2023) 30 capsule 0    lisinopril  (PRINIVIL ;ZESTRIL ) 40 MG tablet Take 1 tablet by mouth daily (Patient not taking: Reported on 06/18/2023)      quinapril  (ACCUPRIL ) 40 MG tablet Take 1 tablet by mouth daily (Patient  not taking: Reported on 04/20/2023)  0    torsemide  (DEMADEX ) 20 MG tablet Take 5 tablets by mouth daily      linaclotide  (LINZESS ) 290 MCG CAPS capsule Take 1 capsule by mouth  every morning (before breakfast) 30 capsule     beclomethasone (QVAR REDIHALER) 40 MCG/ACT AERB inhaler Inhale 1 puff into the lungs in the morning and 1 puff in the evening. (Patient not taking: Reported on 07/09/2023)      amLODIPine  (NORVASC ) 5 MG tablet Take 1 tablet by mouth daily  0    PARoxetine  (PAXIL ) 10 MG tablet Take 1 tablet by mouth daily (Patient not taking: Reported on 06/18/2023)  0    albuterol -ipratropium (COMBIVENT RESPIMAT) 20-100 MCG/ACT AERS inhaler Inhale 1 puff into the lungs every 6 hours (Patient not taking: Reported on 07/16/2023)      insulin  glargine (LANTUS ) 100 UNIT/ML injection vial Inject 14 Units into the skin nightly (Patient not taking: Reported on 09/14/2023) 10 mL 3    insulin  lispro (HUMALOG ) 100 UNIT/ML SOLN injection vial Inject 0-4 Units into the skin 4 times daily (before meals and nightly) (Patient not taking: Reported on 07/16/2023)      linezolid  (ZYVOX ) 600 MG tablet Take 1 tablet by mouth 2 times daily Bid FOR 14 DAYS (Patient not taking: Reported on 03/05/2023)      arformoterol  tartrate 15 MCG/2ML NEBU 15 mcg, budesonide  0.25 MG/2ML SUSP 250 mcg Take 1 Dose by nebulization in the morning and 1 Dose in the evening. (Patient not taking: Reported on 12/18/2022) 60 Units 11    albuterol  sulfate HFA (VENTOLIN  HFA) 108 (90 Base) MCG/ACT inhaler Inhale 2 puffs into the lungs 4 times daily as needed for Wheezing (Patient not taking: Reported on 12/18/2022) 54 g 1    albuterol  (PROVENTIL ) (2.5 MG/3ML) 0.083% nebulizer solution Take 3 mLs by nebulization 4 times daily (Patient not taking: Reported on 04/20/2023) 120 each 3    QUEtiapine  (SEROQUEL ) 25 MG tablet Take 0.5 tablets by mouth nightly (Patient not taking: Reported on 09/14/2023) 60 tablet 3    arformoterol  tartrate (BROVANA ) 15 MCG/2ML NEBU Take 1 ampule by nebulization 2 times daily (Patient not taking: Reported on 12/18/2022) 120 mL 3    budesonide  (PULMICORT ) 0.25 MG/2ML nebulizer suspension Take 2 mLs by nebulization 2  times daily (Patient not taking: Reported on 12/18/2022) 60 each 3    albuterol  sulfate HFA (VENTOLIN  HFA) 108 (90 Base) MCG/ACT inhaler Inhale 2 puffs into the lungs 4 times daily as needed for Wheezing (Patient not taking: Reported on 04/20/2023) 18 g 0    carvedilol  (COREG ) 12.5 MG tablet Take 1 tablet by mouth 2 times daily      Multiple Vitamins-Minerals (THERAPEUTIC MULTIVITAMIN-MINERALS) tablet Take 1 tablet by mouth daily      aspirin  81 MG chewable tablet Take 1 tablet by mouth daily 30 tablet 11    vitamin D3 (CHOLECALCIFEROL ) 125 MCG (5000 UT) TABS tablet Take 1 tablet by mouth daily 30 tablet 11    quinapril  (ACCUPRIL ) 40 MG tablet quinapril  40 mg tablet   TAKE 1 TABLET BY MOUTH EVERY NIGHT FOR HIGH BLOOD PRESSURE (Patient not taking: Reported on 12/19/2022) 90 tablet 3    insulin  lispro, 1 Unit Dial, (HUMALOG /ADMELOG ) 100 UNIT/ML SOPN       ipratropium (ATROVENT HFA) 17 MCG/ACT inhaler Atrovent HFA 17 mcg/actuation aerosol inhaler (Patient not taking: Reported on 02/16/2023)      linaclotide  (LINZESS ) 290 MCG CAPS capsule Take 1 capsule by mouth as  needed (Patient not taking: Reported on 06/09/2023)      olopatadine (PATANOL) 0.1 % ophthalmic solution Apply 2 drops to eye 2 times daily (Patient not taking: Reported on 04/20/2023)       No current facility-administered medications on file prior to encounter.         Written patient dismissal instructions given to patient and signed by patient or POA.         Electronically signed by Dagoberto LITTIE Cheney, MD on 09/25/2023 at 12:59 PM

## 2023-09-24 NOTE — Flowsheet Note (Signed)
 09/24/23 1110   Right Leg Edema Point of Measurement   Leg circumference 42 cm   Ankle circumference 25 cm   Compression Therapy 2 layer compression wrap   Left Leg Edema Point of Measurement   Leg circumference 45 cm   Ankle circumference 25.5 cm   Peripheral Vascular   RLE Edema +1;Pitting   LLE Edema +1;Pitting   RLE Neurovascular Assessment   Capillary Refill Less than/Equal to 3 seconds   Color Yellow-Brown/Hemosiderin Staining   Temperature Warm   RLE Sensation  Full sensation   R Pedal Pulse +2   LLE Neurovascular Assessment   Capillary Refill Less than/Equal to 3 seconds   Color Yellow-Brown/Hemosiderin Staining   Temperature Warm   LLE Sensation  Full sensation   L Pedal Pulse +2   Wound 06/18/23 Leg Right Circumferential   Date First Assessed/Time First Assessed: 06/18/23 0900   Present on Original Admission: Yes  Wound Approximate Age at First Assessment (Weeks): 4 weeks  Primary Wound Type: Venous Ulcer  Location: Leg  Wound Location Orientation: Right  Wound Descript...   Wound Image     Wound Etiology Venous   Dressing Status Intact;Old drainage noted   Wound Cleansed Soap and water    Offloading for Diabetic Foot Ulcers Offloading not required   Wound Length (cm) 15 cm   Wound Width (cm) 32 cm   Wound Depth (cm) 0.2 cm   Wound Surface Area (cm^2) 480 cm^2   Change in Wound Size % (l*w) -23.87   Wound Volume (cm^3) 96 cm^3   Wound Healing % -24   Wound Assessment Slough;Pink/red   Drainage Amount Copious (>75 % saturated)   Drainage Description Serosanguinous   Odor Mild   Peri-wound Assessment Hemosiderin staining (brown yellow);Maceration   Margins Undefined edges   Wound Thickness Description not for Pressure Injury Full thickness   Wound 06/18/23 Leg Left Circumferential   Date First Assessed/Time First Assessed: 06/18/23 0902   Present on Original Admission: Yes  Wound Approximate Age at First Assessment (Weeks): 4 weeks  Primary Wound Type: Venous Ulcer  Location: Leg  Wound Location  Orientation: Left  Wound Descripti...   Wound Image     Wound Etiology Venous   Dressing Status Intact;Old drainage noted   Wound Cleansed Soap and water    Offloading for Diabetic Foot Ulcers Offloading not required   Wound Length (cm) 14 cm   Wound Width (cm) 35 cm   Wound Depth (cm) 0.2 cm   Wound Surface Area (cm^2) 490 cm^2   Change in Wound Size % (l*w) -256.1   Wound Volume (cm^3) 98 cm^3   Wound Healing % -612   Wound Assessment Pink/red;Slough   Drainage Amount Copious (>75 % saturated)   Drainage Description Serosanguinous   Odor Mild   Peri-wound Assessment Hemosiderin staining (brown yellow);Maceration;Induration   Margins Undefined edges   Wound Thickness Description not for Pressure Injury Full thickness   Pain Assessment   Pain Assessment 0-10   Pain Level 10   Patient's Stated Pain Goal 0 - No pain   Pain Location Leg   Pain Orientation Left;Right   Pain Descriptors Aching;Burning   Pain Type Chronic pain   BP (!) 147/73   Pulse 89   Temp 98.9 F (37.2 C) (Temporal)   Resp 18

## 2023-10-01 ENCOUNTER — Inpatient Hospital Stay: Admit: 2023-10-01 | Discharge: 2023-10-01 | Payer: MEDICARE | Attending: Radiation Oncology | Primary: Internal Medicine

## 2023-10-01 DIAGNOSIS — I87311 Chronic venous hypertension (idiopathic) with ulcer of right lower extremity: Secondary | ICD-10-CM

## 2023-10-01 DIAGNOSIS — I872 Venous insufficiency (chronic) (peripheral): Secondary | ICD-10-CM

## 2023-10-01 NOTE — Progress Notes (Signed)
 [facility]   [facility]   Medical Staff Progress Note     Jamie Bryant  MEDICAL RECORD NUMBER:  [id number]  AGE: 82 y.o.   GENDER: female  DOB: 04-05-1941  EPISODE DATE:  10/01/2023    Chief complaint and reason for visit:     Chief Complaint   Patient presents  for follow up of her recurrent bilateral lower leg venous ulcers     wound care     Wounds on both legs      Patient presenting for follow up evaluation of wound(s) per chief complaint.      Subjective and ROS:     Jamie Jamie Bryant,  a 82 yo female with history of DM 2; HTN, CVA CHF, Parkinson's, delusion, chronic asthma, alleged dementia, CRF and chronic venous insufficiency of the lower legs, returns to the [facility] at the request of her PCP, Dr Graylon,  for evaluation of her recurrent bilateral lower leg venous ulcers.   Patient was seen back in March, 2024 for wound care to her lower legs with venous ulcers. She failed to return for follow up after 3 visits and wounds incompletely healed.       Most recently according to the patient she was hospitalized at a [facility] hospital for CHF and then transferred to a nursing facility ([facility]).   After discharge or during her medical care, she developed new bilateral lower leg venous ulcers spontaneously. She complains of pain but cannot quantitated; has heavy drainage without odor. Has not use any compression stockings of late.  Claims she has some Santyl  at home that was ordered earlier this year but never used.       Has diabetes and unclear about her A1c.  Chart shows A1C of 6.3 on 06/09/23.  Denies smoking.  Patient states she is allergic to Lidocaine  and decline to have it used today.     June 25, 2023-  On follow up, Jamie Bryant feels worst.  Complains of the itch and pain in both the lower leg. Patient has been taking off her compression stockings off and on, not liking it or just doing it. Comes in without any dressings today.  Has new blisters that are  sensitive and pruritic.  She has taken Benadryl .   Complains of lower legs painful.  Denies any fever.  Has no SOB or chest pains.     July 09, 2023- since last seen, Jamie Bryant  has had her dressing changed at the Wound Care clinic to help get her wounds cleaned and provide the proper compression she needs.  She had been taking off her compression stocking due to discomfort or confusion.  She is allergic to lidocaine  and unable to mechanically remove the slough.  Use of hydrocortisone  cream and vaseline gauze and ABD and Drawtex have helped with her wound.   She returns with less edematous lower legs and more content.     Follow-up July 16, 2023-patient with bilateral venous stasis edema and dermatitis and open ulcerations but patient with dementia and difficult social situation lives by herself, is on the verge of being discharged from home health due to noncompliance.     July 30, 2023-  On follow up, the patient continues to complain of pain, 15 of 10 .  Nothing new on the social situation.  Still decline any ability to debride the wound.  Denies any fever or chills.       August 13, 2023-  On follow up, Jamie Bryant continues to complain of pain, 15 of 10 everyday all day.  She is having more drainage with odor; has been having her wraps changed 2 x a week. Did not get gentamicin   topical ointments or  hydrocortisone  cream, stating not called in. Admits to scratching due to itch.  She denies any fever or chills.  She states she is not sure what to do. Talks about dead people in her apartment.  She is looking for another place to live now. Has a son in town. And a daughter in [place] ?     August 27, 2023-  On follow up, Jamie Bryant returns with same complaints of pain.  However, she rates them as 10 of 10.  She claims she took 4 Tylenol  pills before she came to the [facility] ( 2 at 5 am and 2 just before she came).  Complains of fluid pooling at her ankle and making them itchy.  Claims trying not to scratch.   Unclear if she is actually wearing her compression, as there are some reports from the [facility] that at times ( nurse's contact with [facility] on their findings) there are no wrapping on her leg when they make their visits.  Patient is inconsistent on whether or not she replaces her wraps or not.  Denies any fever.  Claims she is eating well     September 10, 2023-   On follow up, Jamie Bryant returns with still pain but now an 8 of 10 albeit still a pain.  She continues with her delusion that he continues to inflict pain and make the wound worse.  She denies any fever.  Still complains of itch in the upper calf.       September 24, 2023-   On follow up, Jamie Bryant continues to be the same.  Pain today is a 9 of 10.  Did not like the medihoney that was applied.  Less delusional today.  Son lives in [place] and daughter in [place] .  Saw Dr Graylon yesterday for wellness follow up.  Patient's last A1C  on 06/09/23 was 6.3.  She took Tylenol  prior to her visit with us  today.    October 01, 2023 - Jamie Bryant returns with complaints of the usual pain in her legs.  Today the pain level is a 15.  Complains PCP would not provide her the lyrica  she wants.  Insurance only pays generic and she wants trade name.  Took some tylenol  today but claims it doesn't work.  Denies any fever or chills.  Did not have any difficulty with the xeroform which was added last week.    History of Wound Context: Per original history and physical on this patient. 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  Less edema and erythema.  Continues to have moderate slough on the right with dry eschars and pigmented skin.  Tender in several spots on the right.  On the left, there Is less slough and more dried pigmented skin some easily flaked off.  Tender with deeper lesions.  Moderate drainage continues.     Treatment - Debridement is recommended.  Patient agrees and consents.  Claims to be allergic to lidocaine  and thus not used.  With a 5 mm curette, the thick layers of slough were ever so gently scraped off the wounds.  Has slightly more pain today, limiting what can be surgically removed.   Debridement stopped when she complains of pain.  Wound was cleaned with saline solution, which she found soothing.  Pain level with debridement went as high as 20 from 15 of 10.   Wound care -  Overall the weekly debridements have shown slow improvement. Her level of cognitive ability makes it hard to manage wound.  She has no family members living with her.  Will continue to have her cleanse with baby shampoo.  Apply hydrocortisone  cream for her itch.  Continue with xeroform to the wounds and cover with ABD pads then superabsorber.   2 layer compression wrap.  Change 3 times a week  Patient advised not to scratch as she can make wound worst.  She continues to demonstrate improvement with the meticulous and gentle removal of slough and having her come weekly.    HH to come and see her 2- 3 times a week.    Follow up in 1 week  Mindful of diabetic diet  Delusion /confusion -a  contributing factor to her slow recover    Other associated diagnoses or problems addressed:  none    Pertinent imaging reviewed including independent interpretation  include:  None    Pertinent labs reviewed.   Medical records and review of external note (s) from other providers done as well.    New lab or imaging orders placed:  None     Prescription drug management: N/A     Discussion of management or test interpretation with  patient who comes alone .    Comorbid conditions affecting wound healing: As per PMH which was reviewed.    Risk of complications and/or mortality of patient management:  This patient has a minimal risk of morbidity and mortality from additional diagnostic testing or treatment. This is due to the above conditions affecting wound healing as well as patient and procedure risk factors. Education and discussion held with patient regarding these disease processes pertinent to wound(s).  Other pertinent decisions include: minor surgery or procedures as below.  The patient's diagnosis or treatment is  significantly limited by social determinants of health as noted by:  patient's mental situation .    Wound 06/18/23 Leg Right Circumferential (Active)   Wound Image    10/01/23 1102   Wound Etiology Venous 10/01/23 1102   Dressing Status New dressing applied 10/01/23 1144   Wound Cleansed Soap and water  10/01/23 1102   Dressing/Treatment Xeroform;ABD 10/01/23 1144   Offloading for Diabetic Foot Ulcers Offloading not required 10/01/23 1102   Wound Length (cm) 18.4 cm 10/01/23 1102   Wound Width (cm) 38 cm 10/01/23 1102   Wound  Depth (cm) 0.2 cm 10/01/23 1102   Wound Surface Area (cm^2) 699.2 cm^2 10/01/23 1102   Change in Wound Size % (l*w) -80.44 10/01/23 1102   Wound Volume (cm^3) 139.84 cm^3 10/01/23 1102   Wound Healing % -80 10/01/23 1102   Wound Assessment Pink/red;Slough;Pale granulation tissue;Hyper granulation tissue 10/01/23 1102   Drainage Amount Copious (>75 % saturated) 10/01/23 1102   Drainage Description Serosanguinous 10/01/23 1102   Odor Mild 10/01/23 1102   Peri-wound Assessment Hemosiderin staining (brown yellow);Fragile 10/01/23 1102   Margins  Undefined edges 10/01/23 1102   Wound Thickness Description not for Pressure Injury Full thickness 10/01/23 1102   Number of days: 105       Wound 06/18/23 Leg Left Circumferential (Active)   Wound Image    10/01/23 1102   Wound Etiology Venous 10/01/23 1102   Dressing Status New dressing applied 10/01/23 1144   Wound Cleansed Soap and water  10/01/23 1102   Dressing/Treatment Xeroform;ABD 10/01/23 1144   Offloading for Diabetic Foot Ulcers Offloading not required 10/01/23 1102   Wound Length (cm) 12.2 cm 10/01/23 1102   Wound Width (cm) 27.5 cm 10/01/23 1102   Wound Depth (cm) 0.1 cm 10/01/23 1102   Wound Surface Area (cm^2) 335.5 cm^2 10/01/23 1102   Change in Wound Size % (l*w) -143.82 10/01/23 1102   Wound Volume (cm^3) 33.55 cm^3 10/01/23 1102   Wound Healing % -144 10/01/23 1102   Wound Assessment Pink/red;Slough;Hyper granulation tissue 10/01/23 1102   Drainage Amount Copious (>75 % saturated) 10/01/23 1102   Drainage Description Serosanguinous 10/01/23 1102   Odor Mild 10/01/23 1102   Peri-wound Assessment Hyperpigmented;Hemosiderin staining (brown yellow) 10/01/23 1102   Margins Undefined edges 10/01/23 1102   Wound Thickness Description not for Pressure Injury Full thickness 10/01/23 1102   Number of days: 105          Procedures done during this encounter:   Debridement: Excisional Debridement  Indications:  Based on my examination of this patient's wound(s)/ulcer(s) today, debridement is required to promote healing and evaluate the wound base. Risks and benefits discussed with patient who has agreed to proceed.   Performed by: Dagoberto LITTIE Cheney, MD  Consent obtained:  Yes  Time out taken:  Yes  Pain Control:     Using curette the wound(s)/ulcer(s) was/were debrided down through and including the removal of epidermis, dermis, and subcutaneous tissue.      Devitalized Tissue Debrided:  slough and necrotic/eschar  Pre Debridement Measurements:  Are located in the Wound/Ulcer Documentation Flow Sheet  Wound/Ulcer  #:  numerous lesions in both lower legs- scattered about  Post Debridement Measurements:  Wound/Ulcer Descriptions are Pre Debridement except measurements:  Total Surface Area Debrided:  522.8 sq cm   Diabetic/Pressure/Non Pressure Ulcers only:  Ulcer: Non-Pressure ulcer, fat layer exposed   Estimated Blood Loss:  Minimal  Hemostasis Achieved:  by pressure  Procedural Pain:  20  / 10   Post Procedural Pain:  20 / 10   Response to treatment:  Poorly tolerated by patient. Pain limited what can be removed.  Pain level was above 10 even before start of procedure.  Patient nevertheless wanted to continue with the debridement    TIME: E/M Time spent with patient and/or patient care issues: []  15-20 min  [x]  21-30 min  []  31-44 min  []  45 min or more.   This is above the usual time needed to address patient's chief complaint today: [x]  Yes  []  No  This time  includes physician non-face-to-face service time visit on the date of service such as  Preparing to see the patient (eg, review of tests)  Obtaining and/or reviewing separately obtained history  Performing a medically necessary appropriate examination and/or evaluation  Counseling and educating the patient/family/caregiver  Ordering medications, tests, or procedures  Referring and communicating with other health care professionals as needed  Documenting clinical information in the electronic or other health record  Independently interpreting results (not reported separately) and communicating results to the patient/family/caregiver  Care coordination (not reported separately)    Objective:    BP (!) 148/62   Pulse 87   Temp 97.7 F (36.5 C) (Temporal)   Resp 18   Wt Readings from Last 3 Encounters:   09/14/23 100 kg (220 lb 8 oz)   07/10/23 98 kg (216 lb 1.6 oz)   06/18/23 99.8 kg (220 lb)       PHYSICAL EXAM  General: Alert and in no acute distress. Normal appearing. Obese.    Skin: Warm and dry, no rash  Head: Normocephalic and atraumatic  Eyes: Extraocular eye  movements intact, conjunctivae normal, and sclera anicteric  ENT: Hearing grossly normal bilaterally. Normal appearance  Respiratory: no respiratory distress  GI: Abdomen non-tender and benign  Musculoskeletal: Baseline range of motion in joints. Nontender calves. No cyanosis. Edema 2+.  Bilateral lower leg- some improvement overall but still copious amount of slough and nonviable tissue.   Neurologic: Speech normal. At baseline without new focal deficits. Mental status normal or at baseline.  Less confusion today but more uncomfortable than her usual self.    PAST MEDICAL HISTORY        Diagnosis Date    Arthritis     Asthma     Chronic renal disease, stage III (HCC) [698720] 10/13/2022    Depression     Diabetes (HCC)     Fibromyalgia     Fibromyalgia     Gastroparesis     Hyperlipemia     Hypertension     Neuropathy     Psychotic disorder (HCC)     Stroke (HCC)        PAST SURGICAL HISTORY    Past Surgical History:   Procedure Laterality Date    COLONOSCOPY      HYSTERECTOMY (CERVIX STATUS UNKNOWN)         FAMILY HISTORY    Family History   Problem Relation Age of Onset    Breast Cancer Maternal Grandmother     Cancer Father         lung ca    Hypertension Father     Diabetes Mother     Hypertension Mother        SOCIAL HISTORY    Social History     Tobacco Use    Smoking status: Never     Passive exposure: Never    Smokeless tobacco: Never   Vaping Use    Vaping status: Never Used   Substance Use Topics    Alcohol use: No    Drug use: No       ALLERGIES    Allergies   Allergen Reactions    Iodinated Contrast Media Shortness Of Breath    Statins Myalgia     Lovastatin, pravastatin, simvastatin, atorvastatin, zetia, livalo, lovaza    Adhesive Tape Rash    Ampicillin Rash    Gabapentin Nausea And Vomiting    Lidocaine  Rash       MEDICATIONS    Current Outpatient  Medications on File Prior to Encounter   Medication Sig Dispense Refill    Continuous Glucose Sensor (FREESTYLE LIBRE 2 SENSOR) MISC Blood sugar checks  before meals and at bedtime and as needed 2 each 11    hydrocortisone  2.5 % cream Apply topically to affected area with each wound dressing change 20 g 1    SITagliptin  (JANUVIA ) 100 MG tablet Take 1 tablet by mouth daily 90 tablet 3    hydrocortisone  2.5 % cream Apply topically topically to areas of pruritus with each change of dressing 3.5 g 1    REPATHA  SURECLICK 140 MG/ML SOAJ INJECT AS DIRECTED EVERY 2 WEEKS 2 mL 11    torsemide  (DEMADEX ) 20 MG tablet Take 5 tablets by mouth daily      amLODIPine  (NORVASC ) 5 MG tablet Take 1 tablet by mouth daily  0    carvedilol  (COREG ) 12.5 MG tablet Take 1 tablet by mouth 2 times daily      aspirin  81 MG chewable tablet Take 1 tablet by mouth daily 30 tablet 11    vitamin D3 (CHOLECALCIFEROL ) 125 MCG (5000 UT) TABS tablet Take 1 tablet by mouth daily 30 tablet 11    insulin  lispro, 1 Unit Dial, (HUMALOG /ADMELOG ) 100 UNIT/ML SOPN       pregabalin  (LYRICA ) 300 MG capsule Take 1 capsule by mouth daily for 364 days. DAW BRAND NAME ONLY Max Daily Amount: 300 mg (Patient not taking: Reported on 10/01/2023) 90 capsule 3    omeprazole  (PRILOSEC) 40 MG delayed release capsule Take 1 capsule by mouth every morning (before breakfast) (Patient not taking: Reported on 09/10/2023) 30 capsule 0    lisinopril  (PRINIVIL ;ZESTRIL ) 40 MG tablet Take 1 tablet by mouth daily (Patient not taking: Reported on 06/18/2023)      quinapril  (ACCUPRIL ) 40 MG tablet Take 1 tablet by mouth daily (Patient not taking: Reported on 04/20/2023)  0    linaclotide  (LINZESS ) 290 MCG CAPS capsule Take 1 capsule by mouth every morning (before breakfast) (Patient not taking: Reported on 10/01/2023) 30 capsule     beclomethasone (QVAR REDIHALER) 40 MCG/ACT AERB inhaler Inhale 1 puff into the lungs in the morning and 1 puff in the evening. (Patient not taking: Reported on 07/09/2023)      PARoxetine  (PAXIL ) 10 MG tablet Take 1 tablet by mouth daily (Patient not taking: Reported on 06/18/2023)  0    albuterol -ipratropium  (COMBIVENT RESPIMAT) 20-100 MCG/ACT AERS inhaler Inhale 1 puff into the lungs every 6 hours (Patient not taking: Reported on 07/16/2023)      insulin  glargine (LANTUS ) 100 UNIT/ML injection vial Inject 14 Units into the skin nightly (Patient not taking: Reported on 09/14/2023) 10 mL 3    insulin  lispro (HUMALOG ) 100 UNIT/ML SOLN injection vial Inject 0-4 Units into the skin 4 times daily (before meals and nightly) (Patient not taking: Reported on 07/16/2023)      linezolid  (ZYVOX ) 600 MG tablet Take 1 tablet by mouth 2 times daily Bid FOR 14 DAYS (Patient not taking: Reported on 03/05/2023)      arformoterol  tartrate 15 MCG/2ML NEBU 15 mcg, budesonide  0.25 MG/2ML SUSP 250 mcg Take 1 Dose by nebulization in the morning and 1 Dose in the evening. (Patient not taking: Reported on 12/18/2022) 60 Units 11    albuterol  sulfate HFA (VENTOLIN  HFA) 108 (90 Base) MCG/ACT inhaler Inhale 2 puffs into the lungs 4 times daily as needed for Wheezing (Patient not taking: Reported on 12/18/2022) 54 g 1    albuterol  (PROVENTIL ) (2.5  MG/3ML) 0.083% nebulizer solution Take 3 mLs by nebulization 4 times daily (Patient not taking: Reported on 04/20/2023) 120 each 3    QUEtiapine  (SEROQUEL ) 25 MG tablet Take 0.5 tablets by mouth nightly (Patient not taking: Reported on 09/14/2023) 60 tablet 3    arformoterol  tartrate (BROVANA ) 15 MCG/2ML NEBU Take 1 ampule by nebulization 2 times daily (Patient not taking: Reported on 12/18/2022) 120 mL 3    budesonide  (PULMICORT ) 0.25 MG/2ML nebulizer suspension Take 2 mLs by nebulization 2 times daily (Patient not taking: Reported on 12/18/2022) 60 each 3    albuterol  sulfate HFA (VENTOLIN  HFA) 108 (90 Base) MCG/ACT inhaler Inhale 2 puffs into the lungs 4 times daily as needed for Wheezing (Patient not taking: Reported on 04/20/2023) 18 g 0    Multiple Vitamins-Minerals (THERAPEUTIC MULTIVITAMIN-MINERALS) tablet Take 1 tablet by mouth daily (Patient not taking: Reported on 10/01/2023)      quinapril  (ACCUPRIL )  40 MG tablet quinapril  40 mg tablet   TAKE 1 TABLET BY MOUTH EVERY NIGHT FOR HIGH BLOOD PRESSURE (Patient not taking: Reported on 12/19/2022) 90 tablet 3    ipratropium (ATROVENT HFA) 17 MCG/ACT inhaler Atrovent HFA 17 mcg/actuation aerosol inhaler (Patient not taking: Reported on 02/16/2023)      linaclotide  (LINZESS ) 290 MCG CAPS capsule Take 1 capsule by mouth as needed (Patient not taking: Reported on 06/09/2023)      olopatadine (PATANOL) 0.1 % ophthalmic solution Apply 2 drops to eye 2 times daily (Patient not taking: Reported on 04/20/2023)       No current facility-administered medications on file prior to encounter.         Written patient dismissal instructions given to patient and signed by patient or POA.         Electronically signed by Dagoberto LITTIE Cheney, MD on 10/01/2023 at 9:45 PM

## 2023-10-01 NOTE — Flowsheet Note (Signed)
 10/01/23 1144   Right Leg Edema Point of Measurement   Compression Therapy 2 layer compression wrap   Left Leg Edema Point of Measurement   Compression Therapy 2 layer compression wrap   Wound 06/18/23 Leg Right Circumferential   Date First Assessed/Time First Assessed: 06/18/23 0900   Present on Original Admission: Yes  Wound Approximate Age at First Assessment (Weeks): 4 weeks  Primary Wound Type: Venous Ulcer  Location: Leg  Wound Location Orientation: Right  Wound Descript...   Dressing Status New dressing applied   Dressing/Treatment Xeroform;ABD  (drawtex; hydrocortisone  cream to intact skin)   Wound 06/18/23 Leg Left Circumferential   Date First Assessed/Time First Assessed: 06/18/23 0902   Present on Original Admission: Yes  Wound Approximate Age at First Assessment (Weeks): 4 weeks  Primary Wound Type: Venous Ulcer  Location: Leg  Wound Location Orientation: Left  Wound Descripti...   Dressing Status New dressing applied   Dressing/Treatment Xeroform;ABD  (drawtex; hydrocortisone  cream to intact skin)

## 2023-10-01 NOTE — Patient Instructions (Signed)
 Discharge Instructions for  [facility]  [address]  [place], [place] [address]  Phone: [phone] Fax: [phone]    NAME:  Jamie Bryant  DATE OF BIRTH:  May 13, 1941  MEDICAL RECORD NUMBER:  [id number]  DATE:  October 01, 2023    Home [facility]: [facility]    WOUND CARE ORDERS:  Bilateral Leg wounds - Cleanse with baby shampoo. Rinse and pat dry. Apply hydrocortisone  cream to dry/ flaky areas. Apply xeroform gauze to wounds. Cover open areas with ABD pads then superabsorber. Secure with 2 layer compression wrap. Change dressing three times a week. On weeks when visiting the wound clinic, may change twice a week at home.       Activity:  [x]  Elevate leg(s) above the level of the heart when sitting.  [x]  Avoid prolonged standing in one place.   [x]  Do no get dressing/wrap wet.       Dietary:  []  Diet as tolerated      [x]  Diabetic Diet            []  Increase Protein: examples (Meat, cheese, eggs, greek yogurt, fish, nuts)          []  Juven Therapeutic Nutrition Powder  []  Other:       Return Appointment:  [x]  Return Appointment: With Dr. Dagoberto Cheney in 1 week.  []  Nurse Visit :   []  Ordered tests:      Electronically signed by Warren Drivers, RN on 10/01/2023 at 11:17 AM     Wound Care Center Information: Should you experience any significant changes in your wound(s) or have questions about your wound care, please contact the [facility] at [facility] - FRIDAY 8:00 am - 4:30.  If you need help with your wound outside these hours and cannot wait until we are again available, contact your PCP or go to the hospital emergency room.     PLEASE NOTE: IF YOU ARE UNABLE TO OBTAIN WOUND SUPPLIES, CONTINUE TO USE THE SUPPLIES YOU HAVE AVAILABLE UNTIL YOU ARE ABLE TO REACH US . IT IS MOST IMPORTANT TO KEEP THE WOUND COVERED AT ALL TIMES.     Physician Signature:_______________________    Date: ___________ Time:  ____________

## 2023-10-01 NOTE — Flowsheet Note (Signed)
 10/01/23 1102   Right Leg Edema Point of Measurement   Leg circumference 43 cm   Ankle circumference 22 cm   Compression Therapy 2 layer compression wrap   Left Leg Edema Point of Measurement   Leg circumference 45 cm   Ankle circumference 24 cm   Compression Therapy 2 layer compression wrap   Peripheral Vascular   RLE Edema +1;Pitting   LLE Edema +1;Pitting   RLE Neurovascular Assessment   Capillary Refill Less than/Equal to 3 seconds   Color Yellow-Brown/Hemosiderin Staining   Temperature Warm   RLE Sensation  Full sensation   R Pedal Pulse +2   LLE Neurovascular Assessment   Capillary Refill Less than/Equal to 3 seconds   Color Yellow-Brown/Hemosiderin Staining   Temperature Warm   LLE Sensation  Full sensation   L Pedal Pulse +2   Wound 06/18/23 Leg Right Circumferential   Date First Assessed/Time First Assessed: 06/18/23 0900   Present on Original Admission: Yes  Wound Approximate Age at First Assessment (Weeks): 4 weeks  Primary Wound Type: Venous Ulcer  Location: Leg  Wound Location Orientation: Right  Wound Descript...   Wound Image     Wound Etiology Venous   Dressing Status Old drainage noted   Wound Cleansed Soap and water    Offloading for Diabetic Foot Ulcers Offloading not required   Wound Length (cm) 18.4 cm   Wound Width (cm) 38 cm   Wound Depth (cm) 0.2 cm   Wound Surface Area (cm^2) 699.2 cm^2   Change in Wound Size % (l*w) -80.44   Wound Volume (cm^3) 139.84 cm^3   Wound Healing % -80   Wound Assessment Pink/red;Slough;Pale granulation tissue;Hyper granulation tissue   Drainage Amount Copious (>75 % saturated)   Drainage Description Serosanguinous   Odor Mild   Peri-wound Assessment Hemosiderin staining (brown yellow);Fragile   Margins Undefined edges   Wound Thickness Description not for Pressure Injury Full thickness   Wound 06/18/23 Leg Left Circumferential   Date First Assessed/Time First Assessed: 06/18/23 0902   Present on Original Admission: Yes  Wound Approximate Age at First Assessment  (Weeks): 4 weeks  Primary Wound Type: Venous Ulcer  Location: Leg  Wound Location Orientation: Left  Wound Descripti...   Wound Image     Wound Etiology Venous   Dressing Status Old drainage noted   Wound Cleansed Soap and water    Offloading for Diabetic Foot Ulcers Offloading not required   Wound Length (cm) 12.2 cm   Wound Width (cm) 27.5 cm   Wound Depth (cm) 0.1 cm   Wound Surface Area (cm^2) 335.5 cm^2   Change in Wound Size % (l*w) -143.82   Wound Volume (cm^3) 33.55 cm^3   Wound Healing % -144   Wound Assessment Pink/red;Slough;Hyper granulation tissue   Drainage Amount Copious (>75 % saturated)   Drainage Description Serosanguinous   Odor Mild   Peri-wound Assessment Hyperpigmented;Hemosiderin staining (brown yellow)   Margins Undefined edges   Wound Thickness Description not for Pressure Injury Full thickness   Pain Assessment   Pain Assessment 0-10   Pain Level 10   Patient's Stated Pain Goal 0 - No pain   Pain Location Leg   Pain Orientation Right;Left   Pain Descriptors Aching   Functional Pain Assessment Activities are not prevented   Pain Type Chronic pain   BP (!) 148/62   Pulse 87   Temp 97.7 F (36.5 C) (Temporal)   Resp 18

## 2023-10-02 ENCOUNTER — Encounter

## 2023-10-03 MED ORDER — PREGABALIN 300 MG PO CAPS
300 MG | ORAL_CAPSULE | Freq: Every day | ORAL | 3 refills | Status: AC
Start: 2023-10-03 — End: 2024-10-01

## 2023-10-08 ENCOUNTER — Inpatient Hospital Stay: Admit: 2023-10-08 | Discharge: 2023-10-08 | Payer: MEDICARE | Attending: Radiation Oncology | Primary: Internal Medicine

## 2023-10-08 DIAGNOSIS — E11622 Type 2 diabetes mellitus with other skin ulcer: Secondary | ICD-10-CM

## 2023-10-08 NOTE — Other (Signed)
10/08/23 1155   Right Leg Edema Point of Measurement   Compression Therapy 2 layer compression wrap   Left Leg Edema Point of Measurement   Compression Therapy 2 layer compression wrap   Wound 06/18/23 Leg Right Circumferential   Date First Assessed/Time First Assessed: 06/18/23 0900   Present on Original Admission: Yes  Wound Approximate Age at First Assessment (Weeks): 4 weeks  Primary Wound Type: Venous Ulcer  Location: Leg  Wound Location Orientation: Right  Wound Descript...   Wound Etiology Venous   Dressing Status New dressing applied   Dressing/Treatment Xeroform;ABD  (Hydrocortisone cream to intact skin)   Offloading for Diabetic Foot Ulcers Offloading not required   Wound 06/18/23 Leg Left Circumferential   Date First Assessed/Time First Assessed: 06/18/23 0902   Present on Original Admission: Yes  Wound Approximate Age at First Assessment (Weeks): 4 weeks  Primary Wound Type: Venous Ulcer  Location: Leg  Wound Location Orientation: Left  Wound Descripti...   Wound Etiology Venous   Dressing Status New dressing applied   Dressing/Treatment Xeroform;ABD  (hydrocortisone to intact skin)   Offloading for Diabetic Foot Ulcers Offloading not required     Discharge Condition: Stable    Pain: 0    Ambulatory Status: None    Discharge Destination: home    Transportation:car    Accompanied by: FAMILY    Discharge instructions reviewed with FAMILY and copy or written instructions have been provided. All questions/concerns have been addressed at this time.      Multilayer Compression Wrap   (Not Unna) Below the Knee    NAME:  Jamie Bryant  DATE OF BIRTH:  [birth year]  MEDICAL RECORD NUMBER:  [id number]  DATE:  10/08/2023    Multilayer compression wrap: Removed old Multilayer wrap if indicated and wash leg with mild soap/water.  Applied moisturizing agent to dry skin as needed.   Applied primary and secondary dressing as ordered.  Applied multilayered dressing below the knee to right lower leg.  Applied multilayered  dressing below the knee to left lower leg.  Instructed patient/caregiver not to remove dressing and to keep it clean and dry.   Instructed patient/caregiver on complications to report to provider, such as pain, numbness in toes, heavy drainage, and slippage of dressing.      Electronically signed by Bevely Palmer, RN on 10/08/2023 at 11:57 AM

## 2023-10-08 NOTE — Other (Signed)
10/08/23 1102   Right Leg Edema Point of Measurement   Leg circumference 40 cm   Ankle circumference 24 cm   Compression Therapy 2 layer compression wrap   Left Leg Edema Point of Measurement   Leg circumference 42 cm   Ankle circumference 25 cm   Compression Therapy 2 layer compression wrap   Peripheral Vascular   RLE Edema +1;Non-pitting   LLE Edema +1;Non-pitting   RLE Neurovascular Assessment   Capillary Refill Less than/Equal to 3 seconds   Color Yellow-Brown/Hemosiderin Staining   Temperature Warm   RLE Sensation  Full sensation   R Pedal Pulse +2   LLE Neurovascular Assessment   Capillary Refill Less than/Equal to 3 seconds   Color Yellow-Brown/Hemosiderin Staining   Temperature Warm   LLE Sensation  Full sensation   L Pedal Pulse +2   Wound 06/18/23 Leg Right Circumferential   Date First Assessed/Time First Assessed: 06/18/23 0900   Present on Original Admission: Yes  Wound Approximate Age at First Assessment (Weeks): 4 weeks  Primary Wound Type: Venous Ulcer  Location: Leg  Wound Location Orientation: Right  Wound Descript...   Wound Image      Wound Etiology Venous   Dressing Status Old drainage noted   Wound Cleansed Soap and water   Offloading for Diabetic Foot Ulcers Offloading not required   Wound Length (cm) 12 cm   Wound Width (cm) 29 cm   Wound Depth (cm) 0.2 cm   Wound Surface Area (cm^2) 348 cm^2   Change in Wound Size % (l*w) 10.19   Wound Volume (cm^3) 69.6 cm^3   Wound Healing % 10   Wound Assessment Pink/red;Slough   Drainage Amount Copious (>75 % saturated)   Drainage Description Serosanguinous   Odor Mild   Peri-wound Assessment Hyperpigmented;Hemosiderin staining (brown yellow);Blanchable erythema   Margins Undefined edges   Wound Thickness Description not for Pressure Injury Full thickness   Wound 06/18/23 Leg Left Circumferential   Date First Assessed/Time First Assessed: 06/18/23 0902   Present on Original Admission: Yes  Wound Approximate Age at First Assessment (Weeks): 4 weeks   Primary Wound Type: Venous Ulcer  Location: Leg  Wound Location Orientation: Left  Wound Descripti...   Wound Image     Wound Etiology Venous   Dressing Status Old drainage noted   Wound Cleansed Soap and water   Offloading for Diabetic Foot Ulcers Offloading not required   Wound Length (cm) 14 cm   Wound Width (cm) 32 cm   Wound Depth (cm) 0.1 cm   Wound Surface Area (cm^2) 448 cm^2   Change in Wound Size % (l*w) -225.58   Wound Volume (cm^3) 44.8 cm^3   Wound Healing % -226   Wound Assessment Pink/red;Slough   Drainage Amount Copious (>75 % saturated)   Drainage Description Serosanguinous   Odor Mild   Peri-wound Assessment Hyperpigmented;Blanchable erythema;Maceration   Margins Undefined edges   Wound Thickness Description not for Pressure Injury Full thickness   Pain Assessment   Pain Assessment None - Denies Pain   Pain Level 0   Patient's Stated Pain Goal 0 - No pain   BP (!) 133/54   Pulse 77   Temp 97.7 F (36.5 C) (Temporal)   Resp 16

## 2023-10-08 NOTE — Progress Notes (Signed)
[facility]   Wound Care and [facility] Center   Medical Staff Progress Note     Jamie Bryant  MEDICAL RECORD NUMBER:  [id number]  AGE: 82 y.o.   GENDER: female  DOB: 09-13-1941  EPISODE DATE:  10/08/2023    Chief complaint and reason for visit:     Chief Complaint   Patient presents for follow up of her recurrent bilateral lower leg venous ulcers     wound     "Wounds on both legs"      Patient presenting for follow up evaluation of wound(s) per chief complaint.      Subjective and ROS: Jamie Jamie Bryant,  a 82 yo female with history of DM 2; HTN, CVA CHF, Parkinson's, delusion, chronic asthma, alleged dementia, CRF and chronic venous insufficiency of the lower legs, returns to the [facility] at the request of her PCP, [name],  for evaluation of her recurrent bilateral lower leg venous ulcers.   Patient was seen back in March, 2024 for wound care to her lower legs with venous ulcers. She failed to return for follow up after 3 visits and wounds incompletely healed.       Most recently according to the patient she was hospitalized at a [facility] hospital for CHF and then transferred to a nursing facility ([facility]).   After discharge or during her medical care, she developed new bilateral lower leg venous ulcers spontaneously. She complains of pain but cannot quantitated; has heavy drainage without odor. Has not use any compression stockings of late.  Claims she has some Santyl at home that was ordered earlier this year but never used.       Has diabetes and unclear about her A1c.  Chart shows A1C of 6.3 on 06/09/23.  Denies smoking.  Patient states she is allergic to Lidocaine and decline to have it used today.     June 25, 2023-  On follow up, Jamie Bryant feels worst.  Complains of the itch and pain in both the lower leg. Patient has been taking off her compression stockings off and on, not liking it or just doing it. Comes in without any dressings today.  Has new blisters that are sensitive  and pruritic.  She has taken Benadryl.   Complains of lower legs painful.  Denies any fever.  Has no SOB or chest pains.     July 09, 2023- since last seen, Jamie Bryant  has had her dressing changed at the Wound Care clinic to help get her wounds cleaned and provide the proper compression she needs.  She had been taking off her compression stocking due to discomfort or confusion.  She is allergic to lidocaine and unable to mechanically remove the slough.  Use of hydrocortisone cream and vaseline gauze and ABD and Drawtex have helped with her wound.   She returns with less edematous lower legs and more content.     Follow-up July 16, 2023-patient with bilateral venous stasis edema and dermatitis and open ulcerations but patient with dementia and difficult social situation lives by herself, is on the verge of being discharged from home health due to noncompliance.     July 30, 2023-  On follow up, the patient continues to complain of pain, "15 of 10 ".  Nothing new on the social situation.  Still decline any ability to debride the wound.  Denies any fever or chills.       August 13, 2023-  On follow up,  Jamie Bryant continues to complain of pain, "15 of 10" everyday all day.  She is having more drainage with odor; has been having her wraps changed 2 x a week. Did not get gentamicin  topical ointments or  hydrocortisone cream, stating not called in. Admits to scratching due to itch.  She denies any fever or chills.  She states she is not sure what to do. Talks about "dead" people in her apartment.  She is looking for another place to live now. Has a son in town. And a daughter in [place]?     August 27, 2023-  On follow up, Jamie Bryant returns with same complaints of pain.  However, she rates them as 10 of 10.  She claims she took 4 Tylenol pills before she came to the [facility] ( 2 at 5 am and 2 just before she came).  Complains of fluid pooling at her ankle and making them itchy.  Claims trying not to scratch.  Unclear if  she is actually wearing her compression, as there are some reports from the [facility] that at times ( nurse's contact with [facility] on their findings) there are no wrapping on her leg when they make their visits.  Patient is inconsistent on whether or not she replaces her wraps or not.  Denies any fever.  Claims she is eating well     September 10, 2023-   On follow up, Jamie Bryant returns with still pain but now an 8 of 10 albeit still a pain.  She continues with her delusion that "he" continues to inflict pain and make the wound worse.  She denies any fever.  Still complains of itch in the upper calf.       September 24, 2023-   On follow up, Jamie Bryant continues to be the same.  Pain today is a 9 of 10.  Did not like the medihoney that was applied.  Less delusional today.  Son lives in [place] and daughter in [place].  Saw [name] yesterday for wellness follow up.  Patient's last A1C  on 06/09/23 was 6.3.  She took Tylenol prior to her visit with [place] today.     October 01, 2023 - Jamie Bryant returns with complaints of the usual pain in her legs.  Today the pain level is a 15.  Complains PCP would not provide her the lyrica she wants.  Insurance only pays generic and she wants trade name.  Took some tylenol today but claims it doesn't work.  Denies any fever or chills.  Did not have any difficulty with the xeroform which was added last week.     October 08, 2023- On follow up, Jamie Bryant continues to do the same, tolerating the xeroform.Still with pain, 21/10 today, although the wounds appear improved.  Took tylenol today prior to her visit. Delusional  with "guy upstairs" still inflicting pain and creating wounds.    History of Wound Context: Per original history and physical on this patient. 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  Numerous sites of slough present in the groves diffusely on both lower legs.  Very tender today, esp on the right lower leg, limiting what can be debrided, without lidocaine which she declines to use.   Moderate drainage continues.   Edema about the same, remaining improved. Less erythema. No signs of active infection.  Treatment - Debridement is recommended.  Patient agrees and consents.  Claims to be allergic to lidocaine and thus not used.  With a 5 mm curette, the thick layers of slough were ever so gently scraped off the wounds.  Has slightly more pain today, limiting what can be surgically removed.   Debridement stopped when she complains of pain.     Wound care - continue to have her cleanse with baby shampoo.  Apply hydrocortisone cream for her itch.  Continue with xeroform to the wounds and cover with ABD pads then superabsorber.   2 layer compression wrap.  Change 3 times a week  Patient advised not to scratch as she can make wound worst.  Continue with the meticulous and gentle removal of slough and having her come weekly.    HH to come and see her 2- 3 times a week.    Follow up in 1 week  Mindful of diabetic diet;   Juven sample given to patient   Delusion /confusion -a  contributing factor to her slow recover    Other associated diagnoses or problems addressed:  none    Pertinent imaging reviewed including independent interpretation include:  None    Pertinent  labs reviewed.   Medical records and review of external note (s) from other providers done as well.    New lab or imaging orders placed:  None     Prescription drug management: N/A     Discussion of management or test interpretation with  patient who comes alone .    Comorbid conditions affecting wound healing: As per PMH which was reviewed.    Risk of complications and/or mortality of patient management:  This patient has a minimal risk of morbidity and mortality from additional diagnostic testing or treatment. This is due to the above conditions affecting wound healing as well as patient and procedure risk factors. Education and discussion held with patient regarding these disease processes pertinent to wound(s).  Other pertinent decisions include: minor surgery or procedures as below.  The patient's diagnosis or treatment is  significantly limited by social determinants of health as noted by:  mental diagnosis .    Wound 06/18/23 Leg Right Circumferential (Active)   Wound Image     10/08/23 1102   Wound Etiology Venous 10/08/23 1102   Dressing Status Old drainage noted 10/08/23 1102   Wound Cleansed Soap and water 10/08/23 1102   Dressing/Treatment Xeroform;ABD 10/01/23 1144   Offloading for Diabetic Foot Ulcers Offloading not required 10/08/23 1102   Wound Length (cm) 12 cm 10/08/23 1102   Wound Width (cm) 29 cm 10/08/23 1102   Wound Depth (cm) 0.2 cm 10/08/23 1102   Wound Surface Area (cm^2) 348  cm^2 10/08/23 1102   Change in Wound Size % (l*w) 10.19 10/08/23 1102   Wound Volume (cm^3) 69.6 cm^3 10/08/23 1102   Wound Healing % 10 10/08/23 1102   Wound Assessment Pink/red;Slough 10/08/23 1102   Drainage Amount Copious (>75 % saturated) 10/08/23 1102   Drainage Description Serosanguinous 10/08/23 1102   Odor Mild 10/08/23 1102   Peri-wound Assessment Hyperpigmented;Hemosiderin staining (brown yellow);Blanchable erythema 10/08/23 1102   Margins Undefined edges 10/08/23 1102   Wound Thickness Description not for  Pressure Injury Full thickness 10/08/23 1102   Number of days: 112       Wound 06/18/23 Leg Left Circumferential (Active)   Wound Image    10/08/23 1102   Wound Etiology Venous 10/08/23 1102   Dressing Status Old drainage noted 10/08/23 1102   Wound Cleansed Soap and water 10/08/23 1102   Dressing/Treatment Xeroform;ABD 10/01/23 1144   Offloading for Diabetic Foot Ulcers Offloading not required 10/08/23 1102   Wound Length (cm) 14 cm 10/08/23 1102   Wound Width (cm) 32 cm 10/08/23 1102   Wound Depth (cm) 0.1 cm 10/08/23 1102   Wound Surface Area (cm^2) 448 cm^2 10/08/23 1102   Change in Wound Size % (l*w) -225.58 10/08/23 1102   Wound Volume (cm^3) 44.8 cm^3 10/08/23 1102   Wound Healing % -226 10/08/23 1102   Wound Assessment Pink/red;Slough 10/08/23 1102   Drainage Amount Copious (>75 % saturated) 10/08/23 1102   Drainage Description Serosanguinous 10/08/23 1102   Odor Mild 10/08/23 1102   Peri-wound Assessment Hyperpigmented;Blanchable erythema;Maceration 10/08/23 1102   Margins Undefined edges 10/08/23 1102   Wound Thickness Description not for Pressure Injury Full thickness 10/08/23 1102   Number of days: 112          Procedures done during this encounter:   Debridement: Excisional Debridement  Indications:  Based on my examination of this patient's wound(s)/ulcer(s) today, debridement is required to promote healing and evaluate the wound base. Risks and benefits discussed with patient who has agreed to proceed.   Performed by: Ernestina Penna, MD  Consent obtained:  Yes  Time out taken:  Yes  Pain Control:     Using curette the wound(s)/ulcer(s) was/were debrided down through and including the removal of epidermis and dermis and subcutaneous.      Devitalized Tissue Debrided:  slough and necrotic/eschar  Pre Debridement Measurements:  Are located in the Wound/Ulcer Documentation Flow Sheet  Wound/Ulcer #:  diffuse scattered about in bilateral lower legs  Post Debridement Measurements:  Wound/Ulcer Descriptions are  Pre Debridement except measurements:  Total Surface Area Debrided:  448 sq cm   Diabetic/Pressure/Non Pressure Ulcers only:  Ulcer: Non-Pressure ulcer, fat layer exposed   Estimated Blood Loss:  None  Hemostasis Achieved:  not needed  Procedural Pain:  21  / 10   Post Procedural Pain:  21 / 10   Response to treatment:  Well tolerated by patient., with complaints of pain.     TIME: E/M Time spent with patient and/or patient care issues: []  15-20 min  [x]  21-30 min  []  31-44 min  []  45 min or more.   This is above the usual time needed to address patient's chief complaint today: [x]  Yes  []  No  This time includes physician non-face-to-face service time visit on the date of service such as  Preparing to see the patient (eg, review of tests)  Obtaining and/or reviewing separately obtained history  Performing a medically necessary appropriate examination and/or evaluation  Counseling and  educating the patient/family/caregiver  Ordering medications, tests, or procedures  Referring and communicating with other health care professionals as needed  Documenting clinical information in the electronic or other health record  Independently interpreting results (not reported separately) and communicating results to the patient/family/caregiver  Care coordination (not reported separately)    Objective:    BP (!) 133/54   Pulse 77   Temp 97.7 F (36.5 C) (Temporal)   Resp 16   Wt Readings from Last 3 Encounters:   09/14/23 100 kg (220 lb 8 oz)   07/10/23 98 kg (216 lb 1.6 oz)   06/18/23 99.8 kg (220 lb)       PHYSICAL EXAM  General: Alert and in no acute distress. Normal appearing  Skin: Warm and dry, no rash  Head: Normocephalic and atraumatic  Eyes: Extraocular eye movements intact, conjunctivae normal, and sclera anicteric  ENT: Hearing grossly normal bilaterally. Normal appearance  Respiratory:  no respiratory distress  Musculoskeletal: Baseline range of motion in joints. Nontender calves. No cyanosis. Edema 1+.  Bilateral  lower legs - venous ulcers improved with total area appearing smaller and more patchy dry dermis.  Other areas still with lots of slough within the crevices of the wound bed.  Surrounding skin is with dry dermis and much less erythema.  Still claiming lots of pain today  Neurologic: Speech normal. At baseline without new focal deficits. Mental status normal or at baseline. Still delusional     PAST MEDICAL HISTORY        Diagnosis Date    Arthritis     Asthma     Chronic renal disease, stage III (HCC) [161096] 10/13/2022    Depression     Diabetes (HCC)     Fibromyalgia     Fibromyalgia     Gastroparesis     Hyperlipemia     Hypertension     Neuropathy     Psychotic disorder (HCC)     Stroke (HCC)        PAST SURGICAL HISTORY    Past Surgical History:   Procedure Laterality Date    COLONOSCOPY      HYSTERECTOMY (CERVIX STATUS UNKNOWN)         FAMILY HISTORY    Family History   Problem Relation Age of Onset    Breast Cancer Maternal Grandmother     Cancer Father         lung ca    Hypertension Father     Diabetes Mother     Hypertension Mother        SOCIAL HISTORY    Social History     Tobacco Use    Smoking status: Never     Passive exposure: Never    Smokeless tobacco: Never   Vaping Use    Vaping status: Never Used   Substance Use Topics    Alcohol use: No    Drug use: No       ALLERGIES    Allergies   Allergen Reactions    Iodinated Contrast Media Shortness Of Breath    Statins Myalgia     Lovastatin, pravastatin, simvastatin, atorvastatin, zetia, livalo, lovaza    Adhesive Tape Rash    Ampicillin Rash    Gabapentin Nausea And Vomiting    Lidocaine Rash       MEDICATIONS    Current Outpatient Medications on File Prior to Encounter   Medication Sig Dispense Refill    pregabalin (LYRICA) 300 MG capsule Take 1 capsule by  mouth daily for 364 days. DAW BRAND NAME ONLY Max Daily Amount: 300 mg 90 capsule 3    Continuous Glucose Sensor (FREESTYLE LIBRE 2 SENSOR) MISC Blood sugar checks before meals and at bedtime and  as needed 2 each 11    hydrocortisone 2.5 % cream Apply topically to affected area with each wound dressing change 20 g 1    hydrocortisone 2.5 % cream Apply topically topically to areas of pruritus with each change of dressing 3.5 g 1    torsemide (DEMADEX) 20 MG tablet Take 5 tablets by mouth daily      amLODIPine (NORVASC) 5 MG tablet Take 1 tablet by mouth daily  0    carvedilol (COREG) 12.5 MG tablet Take 1 tablet by mouth 2 times daily      aspirin 81 MG chewable tablet Take 1 tablet by mouth daily 30 tablet 11    vitamin D3 (CHOLECALCIFEROL) 125 MCG (5000 UT) TABS tablet Take 1 tablet by mouth daily 30 tablet 11    SITagliptin (JANUVIA) 100 MG tablet Take 1 tablet by mouth daily 90 tablet 3    REPATHA SURECLICK 140 MG/ML SOAJ INJECT AS DIRECTED EVERY 2 WEEKS 2 mL 11    omeprazole (PRILOSEC) 40 MG delayed release capsule Take 1 capsule by mouth every morning (before breakfast) (Patient not taking: Reported on 09/10/2023) 30 capsule 0    lisinopril (PRINIVIL;ZESTRIL) 40 MG tablet Take 1 tablet by mouth daily (Patient not taking: Reported on 06/18/2023)      quinapril (ACCUPRIL) 40 MG tablet Take 1 tablet by mouth daily (Patient not taking: Reported on 04/20/2023)  0    linaclotide (LINZESS) 290 MCG CAPS capsule Take 1 capsule by mouth every morning (before breakfast) (Patient not taking: Reported on 10/01/2023) 30 capsule     beclomethasone (QVAR REDIHALER) 40 MCG/ACT AERB inhaler Inhale 1 puff into the lungs in the morning and 1 puff in the evening. (Patient not taking: Reported on 07/09/2023)      PARoxetine (PAXIL) 10 MG tablet Take 1 tablet by mouth daily (Patient not taking: Reported on 06/18/2023)  0    albuterol-ipratropium (COMBIVENT RESPIMAT) 20-100 MCG/ACT AERS inhaler Inhale 1 puff into the lungs every 6 hours (Patient not taking: Reported on 07/16/2023)      insulin glargine (LANTUS) 100 UNIT/ML injection vial Inject 14 Units into the skin nightly (Patient not taking: Reported on 09/14/2023) 10 mL 3     insulin lispro (HUMALOG) 100 UNIT/ML SOLN injection vial Inject 0-4 Units into the skin 4 times daily (before meals and nightly) (Patient not taking: Reported on 07/16/2023)      linezolid (ZYVOX) 600 MG tablet Take 1 tablet by mouth 2 times daily Bid FOR 14 DAYS (Patient not taking: Reported on 03/05/2023)      arformoterol tartrate 15 MCG/2ML NEBU 15 mcg, budesonide 0.25 MG/2ML SUSP 250 mcg Take 1 Dose by nebulization in the morning and 1 Dose in the evening. (Patient not taking: Reported on 12/18/2022) 60 Units 11    albuterol sulfate HFA (VENTOLIN HFA) 108 (90 Base) MCG/ACT inhaler Inhale 2 puffs into the lungs 4 times daily as needed for Wheezing (Patient not taking: Reported on 12/18/2022) 54 g 1    albuterol (PROVENTIL) (2.5 MG/3ML) 0.083% nebulizer solution Take 3 mLs by nebulization 4 times daily (Patient not taking: Reported on 04/20/2023) 120 each 3    QUEtiapine (SEROQUEL) 25 MG tablet Take 0.5 tablets by mouth nightly (Patient not taking: Reported on 09/14/2023) 60 tablet 3  arformoterol tartrate (BROVANA) 15 MCG/2ML NEBU Take 1 ampule by nebulization 2 times daily (Patient not taking: Reported on 12/18/2022) 120 mL 3    budesonide (PULMICORT) 0.25 MG/2ML nebulizer suspension Take 2 mLs by nebulization 2 times daily (Patient not taking: Reported on 12/18/2022) 60 each 3    albuterol sulfate HFA (VENTOLIN HFA) 108 (90 Base) MCG/ACT inhaler Inhale 2 puffs into the lungs 4 times daily as needed for Wheezing (Patient not taking: Reported on 04/20/2023) 18 g 0    Multiple Vitamins-Minerals (THERAPEUTIC MULTIVITAMIN-MINERALS) tablet Take 1 tablet by mouth daily (Patient not taking: Reported on 10/01/2023)      quinapril (ACCUPRIL) 40 MG tablet quinapril 40 mg tablet   TAKE 1 TABLET BY MOUTH EVERY NIGHT FOR HIGH BLOOD PRESSURE (Patient not taking: Reported on 12/19/2022) 90 tablet 3    insulin lispro, 1 Unit Dial, (HUMALOG/ADMELOG) 100 UNIT/ML SOPN  (Patient not taking: Reported on 10/08/2023)      ipratropium  (ATROVENT HFA) 17 MCG/ACT inhaler Atrovent HFA 17 mcg/actuation aerosol inhaler (Patient not taking: Reported on 02/16/2023)      linaclotide (LINZESS) 290 MCG CAPS capsule Take 1 capsule by mouth as needed (Patient not taking: Reported on 06/09/2023)      olopatadine (PATANOL) 0.1 % ophthalmic solution Apply 2 drops to eye 2 times daily (Patient not taking: Reported on 04/20/2023)       No current facility-administered medications on file prior to encounter.         Written patient dismissal instructions given to patient and signed by patient or POA.         Electronically signed by Ernestina Penna, MD on 10/08/2023 at 11:41 AM

## 2023-10-08 NOTE — Patient Instructions (Signed)
Discharge Instructions for  [facility]  [address]  [place], [place] [address]  Phone: [phone] Fax: [phone]    NAME:  Jamie Bryant  DATE OF BIRTH:  [birth year]  MEDICAL RECORD NUMBER:  [id number]  DATE:  October 08, 2023    Home Health: [facility]    WOUND CARE ORDERS:  Bilateral Leg wounds - Cleanse with baby shampoo. Rinse and pat dry. Apply hydrocortisone cream to dry/ flaky areas. Apply xeroform gauze to wounds. Cover open areas with ABD pads then superabsorber. Secure with 2 layer compression wrap. Change dressing three times a week. On weeks when visiting the wound clinic, may change twice a week at home.       Activity:  [x]  Elevate leg(s) above the level of the heart when sitting.  [x]  Avoid prolonged standing in one place.   [x]  Do no get dressing/wrap wet.       Dietary:  []  Diet as tolerated      [x]  Diabetic Diet            []  Increase Protein: examples (Meat, cheese, eggs, greek yogurt, fish, nuts)          []  Juven Therapeutic Nutrition Powder  []  Other:       Return Appointment:  [x]  Return Appointment: With Dr. Jacklynn Bue in 1 week.  []  Nurse Visit :   []  Ordered tests:      Electronically signed by Shelbie Ammons, RN on 10/08/2023 at 10:59 AM     Wound Care Center Information: Should you experience any significant changes in your wound(s) or have questions about your wound care, please contact the [facility] at [facility] - FRIDAY 8:00 am - 4:30.  If you need help with your wound outside these hours and cannot wait until we are again available, contact your PCP or go to the hospital emergency room.     PLEASE NOTE: IF YOU ARE UNABLE TO OBTAIN WOUND SUPPLIES, CONTINUE TO USE THE SUPPLIES YOU HAVE AVAILABLE UNTIL YOU ARE ABLE TO REACH [place]. IT IS MOST IMPORTANT TO KEEP THE WOUND COVERED AT ALL TIMES.     Physician Signature:_______________________    Date: ___________ Time:  ____________

## 2023-10-12 NOTE — Telephone Encounter (Signed)
Per Dr. Colvin Caroli patient okay to increase pregabalin to 300mg  1 cap bid, rx called into [facility]

## 2023-10-15 ENCOUNTER — Inpatient Hospital Stay: Admit: 2023-10-15 | Discharge: 2023-10-15 | Payer: MEDICARE | Attending: Radiation Oncology | Primary: Internal Medicine

## 2023-10-15 DIAGNOSIS — I87311 Chronic venous hypertension (idiopathic) with ulcer of right lower extremity: Secondary | ICD-10-CM

## 2023-10-15 NOTE — Other (Signed)
10/15/23 1202   Right Leg Edema Point of Measurement   Compression Therapy 2 layer compression wrap   Left Leg Edema Point of Measurement   Compression Therapy 2 layer compression wrap   Wound 06/18/23 Leg Right Circumferential   Date First Assessed/Time First Assessed: 06/18/23 0900   Present on Original Admission: Yes  Wound Approximate Age at First Assessment (Weeks): 4 weeks  Primary Wound Type: Venous Ulcer  Location: Leg  Wound Location Orientation: Right  Wound Descript...   Dressing Status New dressing applied   Dressing/Treatment ABD;Roll gauze;Xeroform  (hydrocortisone cream to intact skin, optilock)   Wound 06/18/23 Leg Left Circumferential   Date First Assessed/Time First Assessed: 06/18/23 0902   Present on Original Admission: Yes  Wound Approximate Age at First Assessment (Weeks): 4 weeks  Primary Wound Type: Venous Ulcer  Location: Leg  Wound Location Orientation: Left  Wound Descripti...   Dressing Status New dressing applied   Dressing/Treatment ABD;Roll gauze;Xeroform  (hydrocortisone cream to intact skin, optilock)     Multilayer Compression Wrap   (Not Unna) Below the Knee    NAME:  Jamie Bryant  DATE OF BIRTH:  23-Sep-1941  MEDICAL RECORD NUMBER:  [id number]  DATE:  October 15, 2023    Removed old dressing; wash with soap and water, Applied primary and secondary dressing as ordered, Placed compression to right lower leg, Place compression to left lower leg, Instructed patient/caregiver not to remove dressing and to keep it clean and dry, Instructed patient/caregiver on complications to report to provider, such as pain, numbness in toes, heavy drainage, and slippage of dressing, and Instructed patient/caregiver on purpose of compression dressing and on activity and exercise recommendations    Response to treatment: Well tolerated by patient      Electronically signed by Alvina Filbert, RN on 10/15/2023 at 12:18 PM

## 2023-10-15 NOTE — Progress Notes (Signed)
[facility]   W[facility] Care and [facility]   Medical Staff Progress Note     Jamie Bryant  MEDICAL RECORD NUMBER:  [id number]  AGE: 82 y.o.   GENDER: female  DOB: 01-24-1941  EPISODE DATE:  10/15/2023    Chief complaint and reason for visit:     Chief Complaint   Jamie Bryant presents for follow up of her recurrent bilateral lower leg venous ulcers     Follow-up     "Leg wounds"      Jamie Bryant presenting for follow up evaluation of wound(s) per chief complaint.      Subjective and ROS:  Jamie Bryant,  a 82 yo female with history of DM 2; HTN, CVA CHF, Parkinson's, delusion, chronic asthma, alleged dementia, CRF and chronic venous insufficiency of the lower legs, returns to the [facility] at the request of her PCP, [name],  for evaluation of her recurrent bilateral lower leg venous ulcers.   Jamie Bryant was seen back in March, 2024 for wound care to her lower legs with venous ulcers. She failed to return for follow up after 3 visits and wounds incompletely healed.       Most recently according to the Jamie Bryant she was hospitalized at a [facility] hospital for CHF and then transferred to a nursing facility ([facility]).   After discharge or during her medical care, she developed new bilateral lower leg venous ulcers spontaneously. She complains of pain but cannot quantitated; has heavy drainage without odor. Has not use any compression stockings of late.  Claims she has some Santyl at home that was ordered earlier this year but never used.       Has diabetes and unclear about her A1c.  Chart shows A1C of 6.3 on 06/09/23.  Denies smoking.  Jamie Bryant states she is allergic to Lidocaine and decline to have it used today.     June 25, 2023-  On follow up, Jamie Bryant feels worst.  Complains of the itch and pain in both the lower leg. Jamie Bryant has been taking off her compression stockings off and on, not liking it or just doing it. Comes in without any dressings today.  Has new blisters that are sensitive and  pruritic.  She has taken Benadryl.   Complains of lower legs painful.  Denies any fever.  Has no SOB or chest pains.     July 09, 2023- since last seen, Jamie Bryant  has had her dressing changed at the Wound Care clinic to help get her wounds cleaned and provide the proper compression she needs.  She had been taking off her compression stocking due to discomfort or confusion.  She is allergic to lidocaine and unable to mechanically remove the slough.  Use of hydrocortisone cream and vaseline gauze and ABD and Drawtex have helped with her wound.   She returns with less edematous lower legs and more content.     Follow-up July 16, 2023-Jamie Bryant with bilateral venous stasis edema and dermatitis and open ulcerations but Jamie Bryant with dementia and difficult social situation lives by herself, is on the verge of being discharged from home health due to noncompliance.     July 30, 2023-  On follow up, the Jamie Bryant continues to complain of pain, "15 of 10 ".  Nothing new on the social situation.  Still decline any ability to debride the wound.  Denies any fever or chills.       August 13, 2023-  On follow up, Jamie  Bryant continues to complain of pain, "15 of 10" everyday all day.  She is having more drainage with odor; has been having her wraps changed 2 x a week. Did not get gentamicin  topical ointments or  hydrocortisone cream, stating not called in. Admits to scratching due to itch.  She denies any fever or chills.  She states she is not sure what to do. Talks about "dead" people in her apartment.  She is looking for another place to live now. Has a son in town. And a daughter in [place]?     August 27, 2023-  On follow up, Jamie Bryant returns with same complaints of pain.  However, she rates them as 10 of 10.  She claims she took 4 Tylenol pills before she came to the [facility] ( 2 at 5 am and 2 just before she came).  Complains of fluid pooling at her ankle and making them itchy.  Claims trying not to scratch.  Unclear if she is  actually wearing her compression, as there are some reports from the [facility] that at times ( nurse's contact with [facility] on their findings) there are no wrapping on her leg when they make their visits.  Jamie Bryant is inconsistent on whether or not she replaces her wraps or not.  Denies any fever.  Claims she is eating well     September 10, 2023-   On follow up, Jamie Bryant returns with still pain but now an 8 of 10 albeit still a pain.  She continues with her delusion that "he" continues to inflict pain and make the wound worse.  She denies any fever.  Still complains of itch in the upper calf.       September 24, 2023-   On follow up, Jamie Bryant continues to be the same.  Pain today is a 9 of 10.  Did not like the medihoney that was applied.  Less delusional today.  Son lives in [place] and daughter in [place].  Saw [name] yesterday for wellness follow up.  Jamie Bryant's last A1C  on 06/09/23 was 6.3.  She took Tylenol prior to her visit with [place] today.     October 01, 2023 - Jamie Bryant returns with complaints of the usual pain in her legs.  Today the pain level is a 15.  Complains PCP would not provide her the lyrica she wants.  Insurance only pays generic and she wants trade name.  Took some tylenol today but claims it doesn't work.  Denies any fever or chills.  Did not have any difficulty with the xeroform which was added last week.     October 08, 2023- On follow up, Jamie Bryant continues to do the same, tolerating the xeroform.Still with pain, 21/10 today, although the wounds appear improved.  Took tylenol today prior to her visit. Delusional  with "guy upstairs" still inflicting pain and creating wounds.     October 15, 2023-  On follow up, Jamie Bryant returns with no new complaints  Pain level is 10/10  which is better than 21/10 last week.   Denies any fever.  Takes tylenol again prior to her visits.  Still delusional about "he" who is inflicting wounds.  Wounds are improved and she worries if "he" would make it worst now  that it is better.     History of Wound Context: Per original history and physical on this Jamie Bryant. 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  Overall, less tenderness.  More on the right side with increase tenderness.  Slough still present but overall less than before. She is making progress.  No lidocaine use as she claims to be allergic to it.  Mild to moderate drainage continues.   Edema is improved. Less erythema. No signs of active infection.  Treatment - Debridement is recommended.  Jamie Bryant agrees and consents.  Lidocaine not used.  With a 5 mm curette, the thick layers of slough were ever so gently scraped off the wounds.  Tolerated much more today with more slough removed. Debridement stopped when she complains of pain on the right.     Wound care - continue to have her cleanse with baby shampoo.  Apply hydrocortisone cream for her itch.  Continue with xeroform to the wounds and cover with ABD pads then superabsorber.   2 layer compression wrap.  Change 2-3 times a week  Jamie Bryant advised not to scratch as she can make wound worst.  Continue with the meticulous and gentle removal of slough and having her come weekly.  This apparently is most effective in controlling her wounds.   HH to come and see her 2- 3 times a week.    Follow up in 1 week  Mindful of diabetic diet;   Juven sample given to Jamie Bryant   Delusion /confusion -a  contributing factor to her slow recover    Other associated diagnoses or problems  addressed:  none    Pertinent imaging reviewed including independent interpretation include:  None    Pertinent labs reviewed.   Medical records and review of external note (s) from other providers done as well.    New lab or imaging orders placed:  None     Prescription drug management: N/A     Discussion of management or test interpretation with  Jamie Bryant who comes alone .    Comorbid conditions affecting wound healing: As per PMH which was reviewed.    Risk of complications and/or mortality of Jamie Bryant management:  This Jamie Bryant has a minimal risk of morbidity and mortality from additional diagnostic testing or treatment. This is due to the above conditions affecting wound healing as well as Jamie Bryant and procedure risk factors. Education and discussion held with Jamie Bryant regarding these disease processes pertinent to wound(s).  Other pertinent decisions include: minor surgery or procedures as below.  The Jamie Bryant's diagnosis or treatment is  significantly limited by social determinants of health as noted by:  mental disorder .    Wound 06/18/23 Leg Right Circumferential (Active)   Wound Image    10/15/23 1116   Wound Etiology Venous 10/15/23 1116   Dressing Status New dressing applied 10/15/23 1202   Wound Cleansed Soap and water 10/15/23 1116   Dressing/Treatment ABD;Roll gauze;Xeroform 10/15/23 1202   Offloading for Diabetic Foot Ulcers Offloading not required 10/15/23 1116   Wound Length (cm) 16.5 cm 10/15/23 1116   Wound Width (cm) 25.1  cm 10/15/23 1116   Wound Depth (cm) 0.2 cm 10/15/23 1116   Wound Surface Area (cm^2) 414.15 cm^2 10/15/23 1116   Change in Wound Size % (l*w) -6.88 10/15/23 1116   Wound Volume (cm^3) 82.83 cm^3 10/15/23 1116   Wound Healing % -7 10/15/23 1116   Wound Assessment Pink/red;Slough 10/15/23 1116   Drainage Amount Large (50-75% saturated) 10/15/23 1116   Drainage Description Serosanguinous 10/15/23 1116   Odor Mild 10/15/23 1116   Peri-wound Assessment Blanchable  erythema;Hyperpigmented;Hemosiderin staining (brown yellow) 10/15/23 1116   Margins Undefined edges 10/15/23 1116   Wound Thickness Description not for Pressure Injury Full thickness 10/15/23 1116   Number of days: 125       Wound 06/18/23 Leg Left Circumferential (Active)   Wound Image    10/15/23 1116   Wound Etiology Venous 10/15/23 1116   Dressing Status New dressing applied 10/15/23 1202   Wound Cleansed Soap and water 10/15/23 1116   Dressing/Treatment ABD;Roll gauze;Xeroform 10/15/23 1202   Offloading for Diabetic Foot Ulcers Offloading not required 10/15/23 1116   Wound Length (cm) 14 cm 10/15/23 1116   Wound Width (cm) 28.5 cm 10/15/23 1116   Wound Depth (cm) 0.2 cm 10/15/23 1116   Wound Surface Area (cm^2) 399 cm^2 10/15/23 1116   Change in Wound Size % (l*w) -189.97 10/15/23 1116   Wound Volume (cm^3) 79.8 cm^3 10/15/23 1116   Wound Healing % -480 10/15/23 1116   Wound Assessment Pink/red;Slough 10/15/23 1116   Drainage Amount Large (50-75% saturated) 10/15/23 1116   Drainage Description Serosanguinous 10/15/23 1116   Odor Mild 10/15/23 1116   Peri-wound Assessment Blanchable erythema;Hemosiderin staining (brown yellow);Hyperpigmented 10/15/23 1116   Margins Undefined edges 10/15/23 1116   Wound Thickness Description not for Pressure Injury Full thickness 10/15/23 1116   Number of days: 125          Procedures done during this encounter:   Debridement: Excisional Debridement  Indications:  Based on my examination of this Jamie Bryant's wound(s)/ulcer(s) today, debridement is required to promote healing and evaluate the wound base. Risks and benefits discussed with Jamie Bryant who has agreed to proceed.   Performed by: Ernestina Penna, MD  Consent obtained:  Yes  Time out taken:  Yes  Pain Control:     Using curette the wound(s)/ulcer(s) was/were debrided down through and including the removal of epidermis, dermis, and subcutaneous tissue.      Devitalized Tissue Debrided:  slough  Pre Debridement Measurements:  Are  located in the Wound/Ulcer Documentation Flow Sheet  Wound/Ulcer #:  multiple wounds in bilateral lower legs  Post Debridement Measurements:  Wound/Ulcer Descriptions are Pre Debridement except measurements:  Total Surface Area Debrided:  283.8 sq cm   Diabetic/Pressure/Non Pressure Ulcers only:  Ulcer: Non-Pressure ulcer, fat layer exposed   Estimated Blood Loss:  None  Hemostasis Achieved:  not needed  Procedural Pain:  10  / 10   Post Procedural Pain:  10 / 10   Response to treatment:  Well tolerated by Jamie Bryant., With complaints of pain.     TIME: E/M Time spent with Jamie Bryant and/or Jamie Bryant care issues: []  15-20 min  [x]  21-30 min  []  31-44 min  []  45 min or more.   This is above the usual time needed to address Jamie Bryant's chief complaint today: [x]  Yes  []  No  This time includes physician non-face-to-face service time visit on the date of service such as  Preparing to see the Jamie Bryant (eg, review of tests)  Obtaining and/or reviewing separately obtained history  Performing a medically necessary appropriate examination and/or evaluation  Counseling and educating the Jamie Bryant/family/caregiver  Ordering medications, tests, or procedures  Referring and communicating with other health care professionals as needed  Documenting clinical information in the electronic or other health record  Independently interpreting results (not reported separately) and communicating results to the Jamie Bryant/family/caregiver  Care coordination (not reported separately)    Objective:    BP (!) 150/72   Pulse 80   Temp 97.7 F (36.5 C) (Temporal)   Resp 18   Wt Readings from Last 3 Encounters:   09/14/23 100 kg (220 lb 8 oz)   07/10/23 98 kg (216 lb 1.6 oz)   06/18/23 99.8 kg (220 lb)       PHYSICAL EXAM  General: Alert and in no acute distress. Normal appearing in NAD. More comfortable and less anxious  Skin: Warm and dry, no rash  Head: Normocephalic and atraumatic  Eyes: Extraocular eye movements intact, conjunctivae normal, and sclera  anicteric  ENT: Hearing grossly normal bilaterally. Normal appearance  Respiratory:  no respiratory distress  Musculoskeletal: Baseline range of motion in joints. Nontender calves. No cyanosis. Edema 1+.  Bilateral lower leg wounds - improving with less inflamed skin ; less erythema.  Areas of improved and less slough present. See images and description  Neurologic: Speech normal. At baseline without new focal deficits. Mental status normal or at baseline. Mentally the same with periods of delusions in conversation    PAST MEDICAL HISTORY        Diagnosis Date    Arthritis     Asthma     Chronic renal disease, stage III (HCC) [213086] 10/13/2022    Depression     Diabetes (HCC)     Fibromyalgia     Fibromyalgia     Gastroparesis     Hyperlipemia     Hypertension     Neuropathy     Psychotic disorder (HCC)     Stroke (HCC)        PAST SURGICAL HISTORY    Past Surgical History:   Procedure Laterality Date    COLONOSCOPY      HYSTERECTOMY (CERVIX STATUS UNKNOWN)         FAMILY HISTORY    Family History   Problem Relation Age of Onset    Breast Cancer Maternal Grandmother     Cancer Father         lung ca    Hypertension Father     Diabetes Mother     Hypertension Mother        SOCIAL HISTORY    Social History     Tobacco Use    Smoking status: Never     Passive exposure: Never    Smokeless tobacco: Never   Vaping Use    Vaping status: Never Used   Substance Use Topics    Alcohol use: No    Drug use: No       ALLERGIES    Allergies   Allergen Reactions    Iodinated Contrast Media Shortness Of Breath    Statins Myalgia     Lovastatin, pravastatin, simvastatin, atorvastatin, zetia, livalo, lovaza    Adhesive Tape Rash    Ampicillin Rash    Gabapentin Nausea And Vomiting    Lidocaine Rash       MEDICATIONS    Current Outpatient Medications on File Prior to Encounter   Medication Sig Dispense Refill    pregabalin (LYRICA) 300  MG capsule Take 1 capsule by mouth daily for 364 days. DAW BRAND NAME ONLY Max Daily Amount: 300 mg  90 capsule 3    Continuous Glucose Sensor (FREESTYLE LIBRE 2 SENSOR) MISC Blood sugar checks before meals and at bedtime and as needed 2 each 11    hydrocortisone 2.5 % cream Apply topically to affected area with each wound dressing change 20 g 1    SITagliptin (JANUVIA) 100 MG tablet Take 1 tablet by mouth daily 90 tablet 3    hydrocortisone 2.5 % cream Apply topically topically to areas of pruritus with each change of dressing 3.5 g 1    REPATHA SURECLICK 140 MG/ML SOAJ INJECT AS DIRECTED EVERY 2 WEEKS 2 mL 11    omeprazole (PRILOSEC) 40 MG delayed release capsule Take 1 capsule by mouth every morning (before breakfast) (Jamie Bryant not taking: Reported on 09/10/2023) 30 capsule 0    lisinopril (PRINIVIL;ZESTRIL) 40 MG tablet Take 1 tablet by mouth daily (Jamie Bryant not taking: Reported on 06/18/2023)      quinapril (ACCUPRIL) 40 MG tablet Take 1 tablet by mouth daily (Jamie Bryant not taking: Reported on 04/20/2023)  0    torsemide (DEMADEX) 20 MG tablet Take 5 tablets by mouth daily      linaclotide (LINZESS) 290 MCG CAPS capsule Take 1 capsule by mouth every morning (before breakfast) (Jamie Bryant not taking: Reported on 10/01/2023) 30 capsule     beclomethasone (QVAR REDIHALER) 40 MCG/ACT AERB inhaler Inhale 1 puff into the lungs in the morning and 1 puff in the evening. (Jamie Bryant not taking: Reported on 07/09/2023)      amLODIPine (NORVASC) 5 MG tablet Take 1 tablet by mouth daily  0    PARoxetine (PAXIL) 10 MG tablet Take 1 tablet by mouth daily (Jamie Bryant not taking: Reported on 06/18/2023)  0    albuterol-ipratropium (COMBIVENT RESPIMAT) 20-100 MCG/ACT AERS inhaler Inhale 1 puff into the lungs every 6 hours (Jamie Bryant not taking: Reported on 07/16/2023)      insulin glargine (LANTUS) 100 UNIT/ML injection vial Inject 14 Units into the skin nightly (Jamie Bryant not taking: Reported on 09/14/2023) 10 mL 3    insulin lispro (HUMALOG) 100 UNIT/ML SOLN injection vial Inject 0-4 Units into the skin 4 times daily (before meals and nightly)  (Jamie Bryant not taking: Reported on 07/16/2023)      linezolid (ZYVOX) 600 MG tablet Take 1 tablet by mouth 2 times daily Bid FOR 14 DAYS (Jamie Bryant not taking: Reported on 03/05/2023)      arformoterol tartrate 15 MCG/2ML NEBU 15 mcg, budesonide 0.25 MG/2ML SUSP 250 mcg Take 1 Dose by nebulization in the morning and 1 Dose in the evening. (Jamie Bryant not taking: Reported on 12/18/2022) 60 Units 11    albuterol sulfate HFA (VENTOLIN HFA) 108 (90 Base) MCG/ACT inhaler Inhale 2 puffs into the lungs 4 times daily as needed for Wheezing (Jamie Bryant not taking: Reported on 12/18/2022) 54 g 1    albuterol (PROVENTIL) (2.5 MG/3ML) 0.083% nebulizer solution Take 3 mLs by nebulization 4 times daily (Jamie Bryant not taking: Reported on 04/20/2023) 120 each 3    QUEtiapine (SEROQUEL) 25 MG tablet Take 0.5 tablets by mouth nightly (Jamie Bryant not taking: Reported on 09/14/2023) 60 tablet 3    arformoterol tartrate (BROVANA) 15 MCG/2ML NEBU Take 1 ampule by nebulization 2 times daily (Jamie Bryant not taking: Reported on 12/18/2022) 120 mL 3    budesonide (PULMICORT) 0.25 MG/2ML nebulizer suspension Take 2 mLs by nebulization 2 times daily (Jamie Bryant not taking: Reported on 12/18/2022) 60 each  3    albuterol sulfate HFA (VENTOLIN HFA) 108 (90 Base) MCG/ACT inhaler Inhale 2 puffs into the lungs 4 times daily as needed for Wheezing (Jamie Bryant not taking: Reported on 04/20/2023) 18 g 0    carvedilol (COREG) 12.5 MG tablet Take 1 tablet by mouth 2 times daily      Multiple Vitamins-Minerals (THERAPEUTIC MULTIVITAMIN-MINERALS) tablet Take 1 tablet by mouth daily (Jamie Bryant not taking: Reported on 10/01/2023)      aspirin 81 MG chewable tablet Take 1 tablet by mouth daily 30 tablet 11    vitamin D3 (CHOLECALCIFEROL) 125 MCG (5000 UT) TABS tablet Take 1 tablet by mouth daily 30 tablet 11    quinapril (ACCUPRIL) 40 MG tablet quinapril 40 mg tablet   TAKE 1 TABLET BY MOUTH EVERY NIGHT FOR HIGH BLOOD PRESSURE (Jamie Bryant not taking: Reported on 12/19/2022) 90 tablet 3     insulin lispro, 1 Unit Dial, (HUMALOG/ADMELOG) 100 UNIT/ML SOPN  (Jamie Bryant not taking: Reported on 10/08/2023)      ipratropium (ATROVENT HFA) 17 MCG/ACT inhaler Atrovent HFA 17 mcg/actuation aerosol inhaler (Jamie Bryant not taking: Reported on 02/16/2023)      linaclotide (LINZESS) 290 MCG CAPS capsule Take 1 capsule by mouth as needed (Jamie Bryant not taking: Reported on 06/09/2023)      olopatadine (PATANOL) 0.1 % ophthalmic solution Apply 2 drops to eye 2 times daily (Jamie Bryant not taking: Reported on 04/20/2023)       No current facility-administered medications on file prior to encounter.         Written Jamie Bryant dismissal instructions given to Jamie Bryant and signed by Jamie Bryant or POA.         Electronically signed by Ernestina Penna, MD on 10/21/2023 at 1:08 PM

## 2023-10-15 NOTE — Other (Signed)
10/15/23 1116   Right Leg Edema Point of Measurement   Leg circumference 40.5 cm   Ankle circumference 24 cm   Compression Therapy 2 layer compression wrap   Left Leg Edema Point of Measurement   Leg circumference 41 cm   Ankle circumference 25.5 cm   Compression Therapy 2 layer compression wrap   Peripheral Vascular   RLE Edema +1;Non-pitting   LLE Edema +2;Pitting   RLE Neurovascular Assessment   Capillary Refill Less than/Equal to 3 seconds   Color Yellow-Brown/Hemosiderin Staining   Temperature Warm   RLE Sensation  Full sensation   R Pedal Pulse +2   LLE Neurovascular Assessment   Capillary Refill Less than/Equal to 3 seconds   Color Yellow-Brown/Hemosiderin Staining   Temperature Warm   LLE Sensation  Full sensation   L Pedal Pulse +2   Wound 06/18/23 Leg Right Circumferential   Date First Assessed/Time First Assessed: 06/18/23 0900   Present on Original Admission: Yes  Wound Approximate Age at First Assessment (Weeks): 4 weeks  Primary Wound Type: Venous Ulcer  Location: Leg  Wound Location Orientation: Right  Wound Descript...   Wound Image     Wound Etiology Venous   Dressing Status Old drainage noted   Wound Cleansed Soap and water   Offloading for Diabetic Foot Ulcers Offloading not required   Wound Length (cm) 16.5 cm   Wound Width (cm) 25.1 cm   Wound Depth (cm) 0.2 cm   Wound Surface Area (cm^2) 414.15 cm^2   Change in Wound Size % (l*w) -6.88   Wound Volume (cm^3) 82.83 cm^3   Wound Healing % -7   Wound Assessment Pink/red;Slough   Drainage Amount Large (50-75% saturated)   Drainage Description Serosanguinous   Odor Mild   Peri-wound Assessment Blanchable erythema;Hyperpigmented;Hemosiderin staining (brown yellow)   Margins Undefined edges   Wound Thickness Description not for Pressure Injury Full thickness   Wound 06/18/23 Leg Left Circumferential   Date First Assessed/Time First Assessed: 06/18/23 0902   Present on Original Admission: Yes  Wound Approximate Age at First Assessment (Weeks): 4  weeks  Primary Wound Type: Venous Ulcer  Location: Leg  Wound Location Orientation: Left  Wound Descripti...   Wound Image     Wound Etiology Venous   Dressing Status Old drainage noted   Wound Cleansed Soap and water   Offloading for Diabetic Foot Ulcers Offloading not required   Wound Length (cm) 14 cm   Wound Width (cm) 28.5 cm   Wound Depth (cm) 0.2 cm   Wound Surface Area (cm^2) 399 cm^2   Change in Wound Size % (l*w) -189.97   Wound Volume (cm^3) 79.8 cm^3   Wound Healing % -480   Wound Assessment Pink/red;Slough   Drainage Amount Large (50-75% saturated)   Drainage Description Serosanguinous   Odor Mild   Peri-wound Assessment Blanchable erythema;Hemosiderin staining (brown yellow);Hyperpigmented   Margins Undefined edges   Wound Thickness Description not for Pressure Injury Full thickness   Pain Assessment   Pain Assessment None - Denies Pain   BP (!) 150/72   Pulse 80   Temp 97.7 F (36.5 C) (Temporal)   Resp 18

## 2023-10-15 NOTE — Patient Instructions (Signed)
Discharge Instructions for  [facility]  [address]  [place], [place] [address]  Phone: [phone] Fax: [phone]    NAME:  Jamie Bryant  DATE OF BIRTH:  [birth year]  MEDICAL RECORD NUMBER:  [id number]  DATE:  October 15, 2023    [facility]: [facility]    WOUND CARE ORDERS:  Bilateral Leg wounds - Cleanse with baby shampoo. Rinse and pat dry. Apply hydrocortisone cream to dry/ flaky areas. Apply xeroform gauze to wounds. Cover open areas with ABD pads then superabsorber. Secure with 2 layer compression wrap. Change dressing three times a week. On weeks when visiting the wound clinic, may change twice a week at home.       Activity:  [x]  Elevate leg(s) above the level of the heart when sitting.  [x]  Avoid prolonged standing in one place.   [x]  Do no get dressing/wrap wet.       Dietary:  []  Diet as tolerated      [x]  Diabetic Diet            []  Increase Protein: examples (Meat, cheese, eggs, greek yogurt, fish, nuts)          []  Juven Therapeutic Nutrition Powder  []  Other:       Return Appointment:  [x]  Return Appointment: With Dr. Jacklynn Bue in 1 week.  []  Nurse Visit :   []  Ordered tests:      Electronically signed by Shelbie Ammons, RN on 10/15/2023 at 11:28 AM     Wound Care Center Information: Should you experience any significant changes in your wound(s) or have questions about your wound care, please contact the [facility] at [facility] - FRIDAY 8:00 am - 4:30.  If you need help with your wound outside these hours and cannot wait until we are again available, contact your PCP or go to the hospital emergency room.     PLEASE NOTE: IF YOU ARE UNABLE TO OBTAIN WOUND SUPPLIES, CONTINUE TO USE THE SUPPLIES YOU HAVE AVAILABLE UNTIL YOU ARE ABLE TO REACH [place]. IT IS MOST IMPORTANT TO KEEP THE WOUND COVERED AT ALL TIMES.     Physician Signature:_______________________    Date: ___________ Time:  ____________

## 2023-10-22 ENCOUNTER — Ambulatory Visit: Payer: MEDICARE | Attending: Radiation Oncology | Primary: Internal Medicine

## 2023-10-29 ENCOUNTER — Inpatient Hospital Stay: Admit: 2023-10-29 | Discharge: 2023-10-29 | Payer: MEDICARE | Attending: Radiation Oncology | Primary: Internal Medicine

## 2023-10-29 DIAGNOSIS — I872 Venous insufficiency (chronic) (peripheral): Secondary | ICD-10-CM

## 2023-10-29 MED ORDER — HYDROCORTISONE 2.5 % EX CREA
2.5 | CUTANEOUS | 2 refills | Status: AC
Start: 2023-10-29 — End: ?

## 2023-10-29 NOTE — Patient Instructions (Addendum)
Discharge Instructions for  [facility]  [address]  [place], [place] [address]  Phone: [phone] Fax: [phone]    NAME:  Jamie Bryant  DATE OF BIRTH:  12-22-1941  MEDICAL RECORD NUMBER:  [id number]  DATE:  October 29, 2023    Home Health: [facility]    WOUND CARE ORDERS:  Bilateral Leg wounds - Cleanse with baby shampoo. Rinse and pat dry. Apply hydrocortisone cream to dry/ flaky areas and heels. Apply xeroform gauze to wounds. Cover open areas with ABD pads then superabsorber. Secure with 2 layer compression wrap. Change dressing three times a week. On weeks when visiting the wound clinic, may change twice a week at home.       Activity:  [x]  Elevate leg(s) above the level of the heart when sitting.  [x]  Avoid prolonged standing in one place.   [x]  Do no get dressing/wrap wet.       Dietary:  []  Diet as tolerated      [x]  Diabetic Diet            []  Increase Protein: examples (Meat, cheese, eggs, greek yogurt, fish, nuts)          []  Juven Therapeutic Nutrition Powder  []  Other:       Return Appointment:  [x]  Return Appointment: With Dr. Jacklynn Bue in 1 week.  []  Nurse Visit :   []  Ordered tests:      Electronically signed by Shelbie Ammons, RN on 10/29/2023 at 11:30 AM     Wound Care Center Information: Should you experience any significant changes in your wound(s) or have questions about your wound care, please contact the [facility] at [facility] - FRIDAY 8:00 am - 4:30.  If you need help with your wound outside these hours and cannot wait until we are again available, contact your PCP or go to the hospital emergency room.     PLEASE NOTE: IF YOU ARE UNABLE TO OBTAIN WOUND SUPPLIES, CONTINUE TO USE THE SUPPLIES YOU HAVE AVAILABLE UNTIL YOU ARE ABLE TO REACH [place]. IT IS MOST IMPORTANT TO KEEP THE WOUND COVERED AT ALL TIMES.     Physician Signature:_______________________    Date: ___________ Time:  ____________

## 2023-10-29 NOTE — Other (Signed)
10/29/23 1114   Right Leg Edema Point of Measurement   Leg circumference 42 cm   Ankle circumference 24 cm   Compression Therapy 2 layer compression wrap   Left Leg Edema Point of Measurement   Leg circumference 41 cm   Ankle circumference 26 cm   Compression Therapy 2 layer compression wrap   Peripheral Vascular   RLE Edema +1;Non-pitting   LLE Edema +2;Pitting   RLE Neurovascular Assessment   Capillary Refill Less than/Equal to 3 seconds   Color Yellow-Brown/Hemosiderin Staining   Temperature Warm   RLE Sensation  Full sensation   R Pedal Pulse +2   LLE Neurovascular Assessment   Capillary Refill Less than/Equal to 3 seconds   Color Yellow-Brown/Hemosiderin Staining   Temperature Warm   LLE Sensation  Full sensation   L Pedal Pulse +2   Wound 06/18/23 Leg Right Circumferential   Date First Assessed/Time First Assessed: 06/18/23 0900   Present on Original Admission: Yes  Wound Approximate Age at First Assessment (Weeks): 4 weeks  Primary Wound Type: Venous Ulcer  Location: Leg  Wound Location Orientation: Right  Wound Descript...   Wound Image     Wound Etiology Venous   Dressing Status Old drainage noted   Wound Cleansed Soap and water   Offloading for Diabetic Foot Ulcers Offloading not required   Wound Length (cm) 15 cm   Wound Width (cm) 26.8 cm   Wound Depth (cm) 0.2 cm   Wound Surface Area (cm^2) 402 cm^2   Change in Wound Size % (l*w) -3.74   Wound Volume (cm^3) 80.4 cm^3   Wound Healing % -4   Wound Assessment Pink/red;Slough   Drainage Amount Large (50-75% saturated)   Drainage Description Serosanguinous   Odor Mild   Peri-wound Assessment Blanchable erythema;Hyperpigmented   Margins Undefined edges   Wound Thickness Description not for Pressure Injury Full thickness   Wound 06/18/23 Leg Left Circumferential   Date First Assessed/Time First Assessed: 06/18/23 0902   Present on Original Admission: Yes  Wound Approximate Age at First Assessment (Weeks): 4 weeks  Primary Wound Type: Venous Ulcer  Location:  Leg  Wound Location Orientation: Left  Wound Descripti...   Wound Image     Wound Etiology Venous   Dressing Status Old drainage noted   Wound Cleansed Soap and water   Offloading for Diabetic Foot Ulcers Offloading not required   Wound Length (cm) 16.3 cm   Wound Width (cm) 32 cm   Wound Depth (cm) 0.2 cm   Wound Surface Area (cm^2) 521.6 cm^2   Change in Wound Size % (l*w) -279.07   Wound Volume (cm^3) 104.32 cm^3   Wound Healing % -658   Wound Assessment Pink/red;Slough   Drainage Amount Large (50-75% saturated)   Drainage Description Sanguinous   Odor Mild   Peri-wound Assessment Blanchable erythema;Hyperpigmented   Margins Undefined edges   Wound Thickness Description not for Pressure Injury Full thickness   Pain Assessment   Pain Assessment None - Denies Pain   Pain Level 0   Patient's Stated Pain Goal 0 - No pain     BP (!) 150/60   Pulse 79   Temp 97 F (36.1 C) (Temporal)

## 2023-10-29 NOTE — Progress Notes (Signed)
[facility]   [facility]   Medical Staff Progress Note     Jamie Bryant  MEDICAL RECORD NUMBER:  [id number]  AGE: 82 y.o.   GENDER: female  DOB: [birth year]  EPISODE DATE:  10/29/2023    Chief complaint and reason for visit:     Chief Complaint   Patient presents for follow up of her recurrent bilateral lower leg venous ulcers     wound     "Wounds on my legs"      Patient presenting for follow up evaluation of wound(s) per chief complaint.      Subjective and ROS: Ms Thula Stewart,  a 82 yo female with history of DM 2; HTN, CVA CHF, Parkinson's, delusion, chronic asthma, alleged dementia, CRF and chronic venous insufficiency of the lower legs, returns to the [facility] at the request of her PCP, [name],  for evaluation of her recurrent bilateral lower leg venous ulcers.   Patient was seen back in March, 2024 for wound care to her lower legs with venous ulcers. She failed to return for follow up after 3 visits and wounds incompletely healed.       Most recently according to the patient she was hospitalized at a [facility] hospital for CHF and then transferred to a nursing facility ([facility]).   After discharge or during her medical care, she developed new bilateral lower leg venous ulcers spontaneously. She complains of pain but cannot quantitated; has heavy drainage without odor. Has not use any compression stockings of late.  Claims she has some Santyl at home that was ordered earlier this year but never used.       Has diabetes and unclear about her A1c.  Chart shows A1C of 6.3 on 06/09/23.  Denies smoking.  Patient states she is allergic to Lidocaine and decline to have it used today.     June 25, 2023-  On follow up, Ms Derhammer feels worst.  Complains of the itch and pain in both the lower leg. Patient has been taking off her compression stockings off and on, not liking it or just doing it. Comes in without any dressings today.  Has new blisters that are sensitive and  pruritic.  She has taken Benadryl.   Complains of lower legs painful.  Denies any fever.  Has no SOB or chest pains.     July 09, 2023- since last seen, Ms Carmicheal  has had her dressing changed at the [facility]ound Care clinic to help get her wounds cleaned and provide the proper compression she needs.  She had been taking off her compression stocking due to discomfort or confusion.  She is allergic to lidocaine and unable to mechanically remove the slough.  Use of hydrocortisone cream and vaseline gauze and ABD and Drawtex have helped with her wound.   She returns with less edematous lower legs and more content.     Follow-up July 16, 2023-patient with bilateral venous stasis edema and dermatitis and open ulcerations but patient with dementia and difficult social situation lives by herself, is on the verge of being discharged from home health due to noncompliance.     July 30, 2023-  On follow up, the patient continues to complain of pain, "15 of 10 ".  Nothing new on the social situation.  Still decline any ability to debride the wound.  Denies any fever or chills.       August 13, 2023-  On follow up,  Ms Maiorana continues to complain of pain, "15 of 10" everyday all day.  She is having more drainage with odor; has been having her wraps changed 2 x a week. Did not get gentamicin  topical ointments or  hydrocortisone cream, stating not called in. Admits to scratching due to itch.  She denies any fever or chills.  She states she is not sure what to do. Talks about "dead" people in her apartment.  She is looking for another place to live now. Has a son in town. And a daughter in [place]?     August 27, 2023-  On follow up, Ms Davoli returns with same complaints of pain.  However, she rates them as 10 of 10.  She claims she took 4 Tylenol pills before she came to the [facility] ( 2 at 5 am and 2 just before she came).  Complains of fluid pooling at her ankle and making them itchy.  Claims trying not to scratch.  Unclear if she is  actually wearing her compression, as there are some reports from the [facility] that at times ( nurse's contact with [facility] on their findings) there are no wrapping on her leg when they make their visits.  Patient is inconsistent on whether or not she replaces her wraps or not.  Denies any fever.  Claims she is eating well     September 10, 2023-   On follow up, Ms Dai returns with still pain but now an 8 of 10 albeit still a pain.  She continues with her delusion that "he" continues to inflict pain and make the wound worse.  She denies any fever.  Still complains of itch in the upper calf.       September 24, 2023-   On follow up, Ms Wynder continues to be the same.  Pain today is a 9 of 10.  Did not like the medihoney that was applied.  Less delusional today.  Son lives in [place] and daughter in [place].  Saw [name] yesterday for wellness follow up.  Patient's last A1C  on 06/09/23 was 6.3.  She took Tylenol prior to her visit with [place] today.     October 01, 2023 - Ms Tamashiro returns with complaints of the usual pain in her legs.  Today the pain level is a 15.  Complains PCP would not provide her the lyrica she wants.  Insurance only pays generic and she wants trade name.  Took some tylenol today but claims it doesn't work.  Denies any fever or chills.  Did not have any difficulty with the xeroform which was added last week.     October 08, 2023- On follow up, Ms Clemenson continues to do the same, tolerating the xeroform.Still with pain, 21/10 today, although the wounds appear improved.  Took tylenol today prior to her visit. Delusional  with "guy upstairs" still inflicting pain and creating wounds.     October 15, 2023-  On follow up, Ms Duling returns with no new complaints  Pain level is 10/10  which is better than 21/10 last week.   Denies any fever.  Takes tylenol again prior to her visits.  Still delusional about "he" who is inflicting wounds.  Wounds are improved and she worries if "he" would make it worst now  that it is better.     October 29, 2023-  Ms Gable returns for follow up with complaints still of pain, 10/10, an improvement from 20/10 2 weeks ago.  Took  her tylenol just before coming but still with pain,  Has more complaints of itching in the lower legs.  Runs out of hydrocortisone cream quickly, covering both her lower legs.  No rash reported by patient.  Overall, the wound does appear improved with the left drying up well except for one site.  Has no fever.     History of Wound Context: Per original history and physical on this patient. Changes in history since last evaluation: none    Medical Decision Making:     Problem List Items Addressed This Visit          Circulatory    Venous insufficiency    Idiopathic chronic venous hypertension of right lower extremity with ulcer (HCC)       Other    Severe obesity (BMI 35.0-39.9) with comorbidity - Primary    Venous stasis ulcer of left calf with fat layer exposed without varicose veins (HCC)       Wounds and Treatment Plan:  Bilateral lower legs - venous ulcers - continual improvement; with more dry skin especially on the left.  One smaller area that is most tender laterally on the left.  On the right there are 2 broader areas which are tender and coated more heavily with slough.  More signs of dried dermis.  No signs of infection.  No lidocaine use as she claims to be allergic to it.  Mild to moderate drainage continues.   Edema is improved. Less erythema. No signs of active infection.  Treatment - Debridement is recommended.  Patient agrees and consents.  Lidocaine not used.  With a 5 mm curette, the thick layers of slough were gently scraped off the wounds as before.  .  Tolerance was less today.  Debridement stopped when she complains of pain  Little to no bleeding.     Wound care - continue to have her cleanse with baby shampoo.  Apply hydrocortisone cream for her itch.  Continue with xeroform to the wounds and cover with ABD pads then superabsorber.   2  layer compression wrap.  Change 2-3 times a week  Patient advised not to scratch as she can make wound worst.  Continue with the meticulous and gentle removal of slough and having her come weekly went possible for her.  This apparently is most effective in controlling her open wounds.   HH to come and see her 2- 3 times a week.    Follow up in 1 week  Mindful of diabetic diet;   Juven sample given to patient   Delusion /confusion -a  contributing factor to her slow recover  Reorder hydrocortisone cream- will see to get a larger tube  Instructed to not scratch the leg and wounds    Other associated diagnoses or problems addressed:  none    Pertinent imaging reviewed including independent interpretation include:  None    Pertinent labs reviewed.   Medical records and review of external note (s) from other providers done as well.    New lab or imaging orders placed:  None     Prescription drug management: N/A     Discussion of management or test interpretation with  patient who comes alone .    Comorbid conditions affecting wound healing: As per PMH which was reviewed.    Risk of complications and/or mortality of patient management:  This patient has a minimal risk of morbidity and mortality from additional diagnostic testing or treatment. This is due to the above conditions  affecting wound healing as well as patient and procedure risk factors. Education and discussion held with patient regarding these disease processes pertinent to wound(s).  Other pertinent decisions include: minor surgery or procedures as below.  The patient's diagnosis or treatment is  significantly limited by social determinants of health as noted by:  her mental health .    Wound 06/18/23 Leg Right Circumferential (Active)   Wound Image    10/29/23 1114   Wound Etiology Venous 10/29/23 1114   Dressing Status New dressing applied 10/29/23 1203   Wound Cleansed Soap and water 10/29/23 1114   Dressing/Treatment ABD;Petroleum impregnated gauze  10/29/23 1203   Offloading for Diabetic Foot Ulcers Offloading not required 10/29/23 1203   Wound Length (cm) 15 cm 10/29/23 1114   Wound Width (cm) 26.8 cm 10/29/23 1114   Wound Depth (cm) 0.2 cm 10/29/23 1114   Wound Surface Area (cm^2) 402 cm^2 10/29/23 1114   Change in Wound Size % (l*w) -3.74 10/29/23 1114   Wound Volume (cm^3) 80.4 cm^3 10/29/23 1114   Wound Healing % -4 10/29/23 1114   Wound Assessment Pink/red;Slough 10/29/23 1114   Drainage Amount Large (50-75% saturated) 10/29/23 1114   Drainage Description Serosanguinous 10/29/23 1114   Odor Mild 10/29/23 1114   Peri-wound Assessment Blanchable erythema;Hyperpigmented 10/29/23 1114   Margins Undefined edges 10/29/23 1114   Wound Thickness Description not for Pressure Injury Full thickness 10/29/23 1114   Number of days: 134       Wound 06/18/23 Leg Left Circumferential (Active)   Wound Image    10/29/23 1114   Wound Etiology Venous 10/29/23 1114   Dressing Status New dressing applied 10/29/23 1203   Wound Cleansed Soap and water 10/29/23 1114   Dressing/Treatment ABD;Petroleum impregnated gauze 10/29/23 1203   Offloading for Diabetic Foot Ulcers Offloading not required 10/29/23 1203   Wound Length (cm) 16.3 cm 10/29/23 1114   Wound Width (cm) 32 cm 10/29/23 1114   Wound Depth (cm) 0.2 cm 10/29/23 1114   Wound Surface Area (cm^2) 521.6 cm^2 10/29/23 1114   Change in Wound Size % (l*w) -279.07 10/29/23 1114   Wound Volume (cm^3) 104.32 cm^3 10/29/23 1114   Wound Healing % -658 10/29/23 1114   Wound Assessment Pink/red;Slough;Bleeding 10/29/23 1114   Drainage Amount Large (50-75% saturated) 10/29/23 1114   Drainage Description Sanguinous 10/29/23 1114   Odor Mild 10/29/23 1114   Peri-wound Assessment Blanchable erythema;Hyperpigmented 10/29/23 1114   Margins Undefined edges 10/29/23 1114   Wound Thickness Description not for Pressure Injury Full thickness 10/29/23 1114   Number of days: 134          Procedures done during this encounter:   Debridement:  Excisional Debridement  Indications:  Based on my examination of this patient's wound(s)/ulcer(s) today, debridement is required to promote healing and evaluate the wound base. Risks and benefits discussed with patient who has agreed to proceed.   Performed by: Ernestina Penna, MD  Consent obtained:  Yes  Time out taken:  Yes  Pain Control:     Using curette the wound(s)/ulcer(s) was/were debrided down through and including the removal of epidermis, dermis, and subcutaneous tissue.      Devitalized Tissue Debrided:  fibrin and slough  Pre Debridement Measurements:  Are located in the Wound/Ulcer Documentation Flow Sheet  Wound/Ulcer #:  scattered throughout both lower legs but surface area is getting less  Post Debridement Measurements:  Wound/Ulcer Descriptions are Pre Debridement except measurements:  Total Surface Area Debrided:  253.2 sq cm   Diabetic/Pressure/Non Pressure Ulcers only:  Ulcer: Non-Pressure ulcer, fat layer exposed   Estimated Blood Loss:  Minimal  Hemostasis Achieved:  by pressure  Procedural Pain:  10  / 10   Post Procedural Pain:  10 / 10   Response to treatment:  Poorly tolerated by patient., With complaints of pain.     TIME: E/M Time spent with patient and/or patient care issues: []  15-20 min  [x]  21-30 min  []  31-44 min  []  45 min or more.   This is above the usual time needed to address patient's chief complaint today: []  Yes  []  No  This time includes physician non-face-to-face service time visit on the date of service such as  Preparing to see the patient (eg, review of tests)  Obtaining and/or reviewing separately obtained history  Performing a medically necessary appropriate examination and/or evaluation  Counseling and educating the patient/family/caregiver  Ordering medications, tests, or procedures  Referring and communicating with other health care professionals as needed  Documenting clinical information in the electronic or other health record  Independently interpreting results  (not reported separately) and communicating results to the patient/family/caregiver  Care coordination (not reported separately)    Objective:    BP (!) 150/60   Pulse 79   Temp 97 F (36.1 C) (Temporal)   Wt Readings from Last 3 Encounters:   09/14/23 100 kg (220 lb 8 oz)   07/10/23 98 kg (216 lb 1.6 oz)   06/18/23 99.8 kg (220 lb)       PHYSICAL EXAM  General: Alert and in no acute distress. Normal appearing, obese; pleasant  Skin: Warm and dry, no rash  Head: Normocephalic and atraumatic  Eyes: Extraocular eye movements intact, conjunctivae normal, and sclera anicteric  ENT: Hearing grossly normal bilaterally. Normal appearance  Respiratory: . no respiratory distress  Musculoskeletal: Baseline range of motion in joints. Nontender calves. No cyanosis. Edema 2+.  Neurologic: Speech normal. At baseline without new focal deficits. Mental status normal or at baseline. Less delusional today.     PAST MEDICAL HISTORY        Diagnosis Date    Arthritis     Asthma     Chronic renal disease, stage III [facility]) [[id number]] 10/13/2022    Depression     Diabetes (HCC)     Fibromyalgia     Fibromyalgia     Gastroparesis     Hyperlipemia     Hypertension     Neuropathy     Psychotic disorder (HCC)     Stroke (HCC)        PAST SURGICAL HISTORY    Past Surgical History:   Procedure Laterality Date    COLONOSCOPY      HYSTERECTOMY (CERVIX STATUS UNKNOWN)         FAMILY HISTORY    Family History   Problem Relation Age of Onset    Breast Cancer Maternal Grandmother     Cancer Father         lung ca    Hypertension Father     Diabetes Mother     Hypertension Mother        SOCIAL HISTORY    Social History     Tobacco Use    Smoking status: Never     Passive exposure: Never    Smokeless tobacco: Never   Vaping Use    Vaping status: Never Used   Substance Use Topics    Alcohol use: No  Drug use: No       ALLERGIES    Allergies   Allergen Reactions    Iodinated Contrast Media Shortness Of Breath    Statins Myalgia     Lovastatin,  pravastatin, simvastatin, atorvastatin, zetia, livalo, lovaza    Adhesive Tape Rash    Ampicillin Rash    Gabapentin Nausea And Vomiting    Lidocaine Rash       MEDICATIONS    Current Outpatient Medications on File Prior to Encounter   Medication Sig Dispense Refill    pregabalin (LYRICA) 300 MG capsule Take 1 capsule by mouth daily for 364 days. DAW BRAND NAME ONLY Max Daily Amount: 300 mg 90 capsule 3    hydrocortisone 2.5 % cream Apply topically to affected area with each wound dressing change 20 g 1    hydrocortisone 2.5 % cream Apply topically topically to areas of pruritus with each change of dressing 3.5 g 1    REPATHA SURECLICK 140 MG/ML SOAJ INJECT AS DIRECTED EVERY 2 WEEKS 2 mL 11    torsemide (DEMADEX) 20 MG tablet Take 5 tablets by mouth daily      amLODIPine (NORVASC) 5 MG tablet Take 1 tablet by mouth daily  0    aspirin 81 MG chewable tablet Take 1 tablet by mouth daily 30 tablet 11    vitamin D3 (CHOLECALCIFEROL) 125 MCG (5000 UT) TABS tablet Take 1 tablet by mouth daily 30 tablet 11    Continuous Glucose Sensor (FREESTYLE LIBRE 2 SENSOR) MISC Blood sugar checks before meals and at bedtime and as needed (Patient not taking: Reported on 10/29/2023) 2 each 11    SITagliptin (JANUVIA) 100 MG tablet Take 1 tablet by mouth daily (Patient not taking: Reported on 10/29/2023) 90 tablet 3    omeprazole (PRILOSEC) 40 MG delayed release capsule Take 1 capsule by mouth every morning (before breakfast) (Patient not taking: Reported on 09/10/2023) 30 capsule 0    lisinopril (PRINIVIL;ZESTRIL) 40 MG tablet Take 1 tablet by mouth daily (Patient not taking: Reported on 06/18/2023)      quinapril (ACCUPRIL) 40 MG tablet Take 1 tablet by mouth daily (Patient not taking: Reported on 04/20/2023)  0    linaclotide (LINZESS) 290 MCG CAPS capsule Take 1 capsule by mouth every morning (before breakfast) (Patient not taking: Reported on 10/01/2023) 30 capsule     beclomethasone (QVAR REDIHALER) 40 MCG/ACT AERB inhaler Inhale 1  puff into the lungs in the morning and 1 puff in the evening. (Patient not taking: Reported on 07/09/2023)      PARoxetine (PAXIL) 10 MG tablet Take 1 tablet by mouth daily (Patient not taking: Reported on 06/18/2023)  0    albuterol-ipratropium (COMBIVENT RESPIMAT) 20-100 MCG/ACT AERS inhaler Inhale 1 puff into the lungs every 6 hours (Patient not taking: Reported on 07/16/2023)      insulin glargine (LANTUS) 100 UNIT/ML injection vial Inject 14 Units into the skin nightly (Patient not taking: Reported on 09/14/2023) 10 mL 3    insulin lispro (HUMALOG) 100 UNIT/ML SOLN injection vial Inject 0-4 Units into the skin 4 times daily (before meals and nightly) (Patient not taking: Reported on 07/16/2023)      linezolid (ZYVOX) 600 MG tablet Take 1 tablet by mouth 2 times daily Bid FOR 14 DAYS (Patient not taking: Reported on 03/05/2023)      arformoterol tartrate 15 MCG/2ML NEBU 15 mcg, budesonide 0.25 MG/2ML SUSP 250 mcg Take 1 Dose by nebulization in the morning and 1 Dose in the  evening. (Patient not taking: Reported on 12/18/2022) 60 Units 11    albuterol sulfate HFA (VENTOLIN HFA) 108 (90 Base) MCG/ACT inhaler Inhale 2 puffs into the lungs 4 times daily as needed for Wheezing (Patient not taking: Reported on 12/18/2022) 54 g 1    albuterol (PROVENTIL) (2.5 MG/3ML) 0.083% nebulizer solution Take 3 mLs by nebulization 4 times daily (Patient not taking: Reported on 04/20/2023) 120 each 3    QUEtiapine (SEROQUEL) 25 MG tablet Take 0.5 tablets by mouth nightly (Patient not taking: Reported on 09/14/2023) 60 tablet 3    arformoterol tartrate (BROVANA) 15 MCG/2ML NEBU Take 1 ampule by nebulization 2 times daily (Patient not taking: Reported on 12/18/2022) 120 mL 3    budesonide (PULMICORT) 0.25 MG/2ML nebulizer suspension Take 2 mLs by nebulization 2 times daily (Patient not taking: Reported on 12/18/2022) 60 each 3    albuterol sulfate HFA (VENTOLIN HFA) 108 (90 Base) MCG/ACT inhaler Inhale 2 puffs into the lungs 4 times daily as  needed for Wheezing (Patient not taking: Reported on 04/20/2023) 18 g 0    carvedilol (COREG) 12.5 MG tablet Take 1 tablet by mouth 2 times daily (Patient not taking: Reported on 10/29/2023)      Multiple Vitamins-Minerals (THERAPEUTIC MULTIVITAMIN-MINERALS) tablet Take 1 tablet by mouth daily (Patient not taking: Reported on 10/01/2023)      quinapril (ACCUPRIL) 40 MG tablet quinapril 40 mg tablet   TAKE 1 TABLET BY MOUTH EVERY NIGHT FOR HIGH BLOOD PRESSURE (Patient not taking: Reported on 12/19/2022) 90 tablet 3    insulin lispro, 1 Unit Dial, (HUMALOG/ADMELOG) 100 UNIT/ML SOPN  (Patient not taking: Reported on 10/08/2023)      ipratropium (ATROVENT HFA) 17 MCG/ACT inhaler Atrovent HFA 17 mcg/actuation aerosol inhaler (Patient not taking: Reported on 02/16/2023)      linaclotide (LINZESS) 290 MCG CAPS capsule Take 1 capsule by mouth as needed (Patient not taking: Reported on 06/09/2023)      olopatadine (PATANOL) 0.1 % ophthalmic solution Apply 2 drops to eye 2 times daily (Patient not taking: Reported on 04/20/2023)       No current facility-administered medications on file prior to encounter.         Written patient dismissal instructions given to patient and signed by patient or POA.         Electronically signed by Ernestina Penna, MD on 10/30/2023 at 11:14 AM

## 2023-10-29 NOTE — Other (Signed)
10/29/23 1203   Right Leg Edema Point of Measurement   Compression Therapy 2 layer compression wrap   Left Leg Edema Point of Measurement   Compression Therapy 2 layer compression wrap   Wound 06/18/23 Leg Right Circumferential   Date First Assessed/Time First Assessed: 06/18/23 0900   Present on Original Admission: Yes  Wound Approximate Age at First Assessment (Weeks): 4 weeks  Primary Wound Type: Venous Ulcer  Location: Leg  Wound Location Orientation: Right  Wound Descript...   Dressing Status New dressing applied   Dressing/Treatment ABD;Petroleum impregnated gauze  (hydrocortisone cream, 2 layer wrap, optilock)   Offloading for Diabetic Foot Ulcers Offloading not required   Wound 06/18/23 Leg Left Circumferential   Date First Assessed/Time First Assessed: 06/18/23 0902   Present on Original Admission: Yes  Wound Approximate Age at First Assessment (Weeks): 4 weeks  Primary Wound Type: Venous Ulcer  Location: Leg  Wound Location Orientation: Left  Wound Descripti...   Dressing Status New dressing applied   Dressing/Treatment ABD;Petroleum impregnated gauze  (hydrocortisone cream, 2 layer wrap, optilock)   Offloading for Diabetic Foot Ulcers Offloading not required     Multilayer Compression Wrap   (Not Unna) Below the Knee    NAME:  Jamie Bryant  DATE OF BIRTH:  March 21, 1941  MEDICAL RECORD NUMBER:  [id number]  DATE:  October 29, 2023    Removed old dressing; wash with soap and water, Applied primary and secondary dressing as ordered, Placed compression to right lower leg, Place compression to left lower leg, Instructed patient/caregiver not to remove dressing and to keep it clean and dry, Instructed patient/caregiver on complications to report to provider, such as pain, numbness in toes, heavy drainage, and slippage of dressing, and Instructed patient/caregiver on purpose of compression dressing and on activity and exercise recommendations    Response to treatment: Well tolerated by  patient      Electronically signed by Loretha Brasil, RN on 10/29/2023 at 12:06 PM

## 2023-11-04 ENCOUNTER — Encounter

## 2023-11-04 NOTE — Telephone Encounter (Signed)
Too soon

## 2023-11-05 ENCOUNTER — Inpatient Hospital Stay: Admit: 2023-11-05 | Discharge: 2023-11-05 | Payer: MEDICARE | Attending: Radiation Oncology | Primary: Internal Medicine

## 2023-11-05 VITALS — BP 125/70 | HR 77 | Temp 97.60000°F | Resp 18

## 2023-11-05 DIAGNOSIS — I87311 Chronic venous hypertension (idiopathic) with ulcer of right lower extremity: Secondary | ICD-10-CM

## 2023-11-05 NOTE — Patient Instructions (Addendum)
Discharge Instructions for  [facility]  [address]  [place], [place] [address]  Phone: [phone] Fax: [phone]    NAME:  Jamie Bryant  DATE OF BIRTH:  23-Jun-1941  MEDICAL RECORD NUMBER:  [id number]  DATE:  November 05, 2023    [facility]    WOUND CARE ORDERS:  Bilateral Leg wounds - Cleanse with baby shampoo. Rinse and pat dry. Apply hydrocortisone cream to dry/ flaky areas and heels. Apply xeroform gauze to wounds. Cover open areas with ABD pads then superabsorber. Secure with 2 layer compression wrap. Change dressing three times a week. On weeks when visiting the wound clinic, may change twice a week at home.       Activity:  [x]  Elevate leg(s) above the level of the heart when sitting.  [x]  Avoid prolonged standing in one place.   [x]  Do no get dressing/wrap wet.       Dietary:  []  Diet as tolerated      [x]  Diabetic Diet            []  Increase Protein: examples (Meat, cheese, eggs, greek yogurt, fish, nuts)          []  Juven Therapeutic Nutrition Powder  []  Other:       Return Appointment:  [x]  Return Appointment: With Dr. Jacklynn Bue in 2 weeks.   []  Nurse Visit :   []  Ordered tests:      Electronically signed by Blair Dolphin, RN on 11/05/2023 at 11:30 AM     Wound Care Center Information: Should you experience any significant changes in your wound(s) or have questions about your wound care, please contact the [facility] at [facility] - FRIDAY 8:00 am - 4:30.  If you need help with your wound outside these hours and cannot wait until we are again available, contact your PCP or go to the hospital emergency room.     PLEASE NOTE: IF YOU ARE UNABLE TO OBTAIN WOUND SUPPLIES, CONTINUE TO USE THE SUPPLIES YOU HAVE AVAILABLE UNTIL YOU ARE ABLE TO REACH [place]. IT IS MOST IMPORTANT TO KEEP THE WOUND COVERED AT ALL TIMES.     Physician Signature:_______________________    Date: ___________ Time:  ____________

## 2023-11-05 NOTE — Other (Signed)
11/05/23 1208   Right Leg Edema Point of Measurement   Compression Therapy 2 layer compression wrap   Left Leg Edema Point of Measurement   Compression Therapy 2 layer compression wrap   Wound 06/18/23 Leg Left Circumferential   Date First Assessed/Time First Assessed: 06/18/23 0902   Present on Original Admission: Yes  Wound Approximate Age at First Assessment (Weeks): 4 weeks  Primary Wound Type: Venous Ulcer  Location: Leg  Wound Location Orientation: Left  Wound Descripti...   Dressing Status New dressing applied   Dressing/Treatment   (xeroform, ABD, 2 layer compression wrap)   Offloading for Diabetic Foot Ulcers Offloading not required   Wound 06/18/23 Leg Right Circumferential   Date First Assessed/Time First Assessed: 06/18/23 0900   Present on Original Admission: Yes  Wound Approximate Age at First Assessment (Weeks): 4 weeks  Primary Wound Type: Venous Ulcer  Location: Leg  Wound Location Orientation: Right  Wound Descript...   Dressing Status New dressing applied   Dressing/Treatment   (xeroform ,ABD, 2 layer compression wrap)   Offloading for Diabetic Foot Ulcers Offloading not required

## 2023-11-05 NOTE — Other (Signed)
11/05/23 1110   Right Leg Edema Point of Measurement   Leg circumference 39 cm   Ankle circumference 23.5 cm   Compression Therapy 2 layer compression wrap   Left Leg Edema Point of Measurement   Leg circumference 43.5 cm   Ankle circumference 25.5 cm   Compression Therapy 2 layer compression wrap   Peripheral Vascular   RLE Edema +1;Non-pitting   LLE Edema +2;Pitting   RLE Neurovascular Assessment   Capillary Refill Less than/Equal to 3 seconds   Color Yellow-Brown/Hemosiderin Staining   Temperature Warm   RLE Sensation  Full sensation   R Pedal Pulse +2   LLE Neurovascular Assessment   Capillary Refill Less than/Equal to 3 seconds   Color Yellow-Brown/Hemosiderin Staining   Temperature Warm   LLE Sensation  Full sensation   L Pedal Pulse +1   Wound 06/18/23 Leg Left Circumferential   Date First Assessed/Time First Assessed: 06/18/23 0902   Present on Original Admission: Yes  Wound Approximate Age at First Assessment (Weeks): 4 weeks  Primary Wound Type: Venous Ulcer  Location: Leg  Wound Location Orientation: Left  Wound Descripti...   Wound Image     Wound Etiology Venous   Dressing Status Old drainage noted   Wound Cleansed Soap and water   Offloading for Diabetic Foot Ulcers Offloading not required   Wound Length (cm) 15.5 cm   Wound Width (cm) 36 cm   Wound Depth (cm) 0.2 cm   Wound Surface Area (cm^2) 558 cm^2   Change in Wound Size % (l*w) -305.52   Wound Volume (cm^3) 111.6 cm^3   Wound Healing % -711   Wound Assessment Pink/red;Slough   Drainage Amount Large (50-75% saturated)   Drainage Description Serosanguinous   Odor Mild   Peri-wound Assessment Blanchable erythema   Margins Undefined edges   Wound Thickness Description not for Pressure Injury Full thickness   Wound 06/18/23 Leg Right Circumferential   Date First Assessed/Time First Assessed: 06/18/23 0900   Present on Original Admission: Yes  Wound Approximate Age at First Assessment (Weeks): 4 weeks  Primary Wound Type: Venous Ulcer  Location: Leg   Wound Location Orientation: Right  Wound Descript...   Wound Image     Wound Etiology Venous   Dressing Status Old drainage noted   Wound Cleansed Soap and water   Offloading for Diabetic Foot Ulcers Offloading not required   Wound Length (cm) 11 cm   Wound Width (cm) 29.5 cm   Wound Depth (cm) 0.2 cm   Wound Surface Area (cm^2) 324.5 cm^2   Change in Wound Size % (l*w) 16.26   Wound Volume (cm^3) 64.9 cm^3   Wound Healing % 16   Wound Assessment Pink/red;Slough   Drainage Amount Large (50-75% saturated)   Drainage Description Serosanguinous   Odor Mild   Peri-wound Assessment Blanchable erythema   Margins Undefined edges   Wound Thickness Description not for Pressure Injury Full thickness   Pain Assessment   Pain Assessment None - Denies Pain     BP 125/70   Pulse 77   Temp 97.6 F (36.4 C) (Temporal)   Resp 18

## 2023-11-17 ENCOUNTER — Ambulatory Visit: Admit: 2023-11-17 | Payer: MEDICARE | Admitting: Internal Medicine | Primary: Internal Medicine

## 2023-11-17 VITALS — BP 136/68 | HR 74 | Temp 98.00000°F | Resp 16 | Ht 64.0 in | Wt 219.5 lb

## 2023-11-17 DIAGNOSIS — Z0000 Encounter for general adult medical examination without abnormal findings: Secondary | ICD-10-CM

## 2023-11-17 NOTE — Patient Instructions (Addendum)
 [employer]*  (Call [employer]/211 if need more resources.)    Medical Care  [employer] Assistance  What they offer: [employer] Program helps uninsured patients who do not qualify for government-

## 2023-11-17 NOTE — Progress Notes (Signed)
 Chief Complaint   Patient presents with    Medicare AWV     Patient states " she is still having leg pain at times."    "Have you been to the ER, urgent care clinic since your last visit?  Hospitalized since your last visit?"    NO    "Have you seen or con

## 2023-11-19 ENCOUNTER — Inpatient Hospital Stay
Admit: 2023-11-19 | Discharge: 2023-11-19 | Disposition: A | Discharge status: Home / Self Care | Payer: MEDICARE | Attending: Radiation Oncology | Admitting: Radiation Oncology | Primary: Internal Medicine

## 2023-11-19 VITALS — BP 121/54 | HR 75 | Temp 97.30000°F | Resp 16

## 2023-11-19 DIAGNOSIS — I872 Venous insufficiency (chronic) (peripheral): Principal | ICD-10-CM

## 2023-11-19 NOTE — Other (Signed)
 11/19/23 1108   Right Leg Edema Point of Measurement   Leg circumference 41 cm   Ankle circumference 23 cm   Compression Therapy 2 layer compression wrap   Left Leg Edema Point of Measurement   Leg circumference 41 cm   Ankle circumference 24 cm   Compr

## 2023-11-19 NOTE — Patient Instructions (Addendum)
 Discharge Instructions for  [facility]  [address]  [place], [place] [address]  Phone: [phone] Fax: [phone]    NAME:  DONALEE GAUMOND  DATE OF BIRTH:  1941-07-01  MEDICAL RECORD NUMBER:  [id number]  DATE:  November 19, 2023

## 2023-11-19 NOTE — Other (Signed)
 11/19/23 1159   Right Leg Edema Point of Measurement   Compression Therapy 2 layer compression wrap   Left Leg Edema Point of Measurement   Compression Therapy 2 layer compression wrap   Wound 06/18/23 Leg Right Circumferential   Date First Assessed/Tim

## 2023-11-24 ENCOUNTER — Encounter: Admit: 2023-11-24

## 2023-11-24 DIAGNOSIS — M5126 Other intervertebral disc displacement, lumbar region: Secondary | ICD-10-CM

## 2023-11-29 MED ORDER — ASPIRIN 81 MG PO CHEW
81 | ORAL_TABLET | Freq: Every day | ORAL | 11 refills | Status: AC
Start: 2023-11-29 — End: ?

## 2023-11-29 NOTE — Progress Notes (Signed)
 [facility]  [address]  [address]  [place] [address]  Phone:  [phone]  Fax: [phone]       Chief Complaint   Patient presents with    Medicare AWV   .      SUBJECTIVE:    Jamie Bryant is a 82 y.o. female C

## 2023-12-03 NOTE — Telephone Encounter (Signed)
 Patient call regarding status for glucose monitor

## 2023-12-10 ENCOUNTER — Inpatient Hospital Stay: Admit: 2023-12-10 | Discharge: 2023-12-10 | Payer: MEDICARE | Attending: Radiation Oncology | Primary: Internal Medicine

## 2023-12-10 VITALS — BP 149/75 | HR 85 | Temp 97.70000°F | Resp 16

## 2023-12-10 DIAGNOSIS — L97222 Non-pressure chronic ulcer of left calf with fat layer exposed: Secondary | ICD-10-CM

## 2023-12-10 NOTE — Other (Signed)
12/10/23 1050   Right Leg Edema Point of Measurement   Leg circumference 42 cm   Ankle circumference 24 cm   Compression Therapy 2 layer compression wrap   Left Leg Edema Point of Measurement   Leg circumference 39.5 cm   Ankle circumference 24 cm   Compression Therapy 2 layer compression wrap   Peripheral Vascular   RLE Edema +1;Pitting   LLE Edema +1;Pitting   RLE Neurovascular Assessment   Capillary Refill Less than/Equal to 3 seconds   Color Yellow-Brown/Hemosiderin Staining   Temperature Warm   RLE Sensation  Full sensation   R Pedal Pulse +2   LLE Neurovascular Assessment   Capillary Refill Less than/Equal to 3 seconds   Color Yellow-Brown/Hemosiderin Staining   Temperature Warm   LLE Sensation  Full sensation   L Pedal Pulse +2   Wound 06/18/23 Leg Right Circumferential   Date First Assessed/Time First Assessed: 06/18/23 0900   Present on Original Admission: Yes  Wound Approximate Age at First Assessment (Weeks): 4 weeks  Primary Wound Type: Venous Ulcer  Location: Leg  Wound Location Orientation: Right  Wound Descript...   Wound Image     Wound Etiology Venous   Dressing Status Old drainage noted   Wound Cleansed Soap and water   Offloading for Diabetic Foot Ulcers Offloading not required   Wound Length (cm) 9.5 cm   Wound Width (cm) 27.5 cm   Wound Depth (cm) 0.2 cm   Wound Surface Area (cm^2) 261.25 cm^2   Change in Wound Size % (l*w) 32.58   Wound Volume (cm^3) 52.25 cm^3   Wound Healing % 33   Wound Assessment Pink/red;Slough   Drainage Amount Large (50-75% saturated)   Drainage Description Green;Other (Comment);Serosanguinous  (blue)   Odor Mild   Peri-wound Assessment Induration;Dry/flaky;Hemosiderin staining (brown yellow)   Margins Undefined edges   Wound Thickness Description not for Pressure Injury Full thickness   Wound 06/18/23 Leg Left Circumferential   Date First Assessed/Time First Assessed: 06/18/23 0902   Present on Original Admission: Yes  Wound Approximate Age at First Assessment  (Weeks): 4 weeks  Primary Wound Type: Venous Ulcer  Location: Leg  Wound Location Orientation: Left  Wound Descripti...   Wound Image     Wound Etiology Venous   Dressing Status Old drainage noted   Wound Cleansed Soap and water   Offloading for Diabetic Foot Ulcers Offloading not required   Wound Length (cm) 9.5 cm   Wound Width (cm) 28.5 cm   Wound Depth (cm) 0.2 cm   Wound Surface Area (cm^2) 270.75 cm^2   Change in Wound Size % (l*w) -96.77   Wound Volume (cm^3) 54.15 cm^3   Wound Healing % -294   Drainage Amount Large (50-75% saturated)   Drainage Description Serosanguinous   Odor Mild   Peri-wound Assessment Dry/flaky;Induration;Hemosiderin staining (brown yellow)   Margins Undefined edges   Wound Thickness Description not for Pressure Injury Full thickness   Pain Assessment   Pain Assessment 0-10   Pain Level 8   Patient's Stated Pain Goal 0 - No pain   Pain Location Leg   Pain Orientation Right;Left   Pain Descriptors Burning     BP (!) 149/75   Pulse 85   Temp 97.7 F (36.5 C) (Temporal)   Resp 16

## 2023-12-10 NOTE — Other (Signed)
12/10/23 1142   Right Leg Edema Point of Measurement   Compression Therapy 2 layer compression wrap   Left Leg Edema Point of Measurement   Compression Therapy 2 layer compression wrap   Wound 06/18/23 Leg Right Circumferential   Date First Assessed/Time First Assessed: 06/18/23 0900   Present on Original Admission: Yes  Wound Approximate Age at First Assessment (Weeks): 4 weeks  Primary Wound Type: Venous Ulcer  Location: Leg  Wound Location Orientation: Right  Wound Descript...   Dressing Status New dressing applied   Dressing/Treatment   (xeroform, ABD, 2 layer compression wrap)   Offloading for Diabetic Foot Ulcers Offloading not required   Wound 06/18/23 Leg Left Circumferential   Date First Assessed/Time First Assessed: 06/18/23 0902   Present on Original Admission: Yes  Wound Approximate Age at First Assessment (Weeks): 4 weeks  Primary Wound Type: Venous Ulcer  Location: Leg  Wound Location Orientation: Left  Wound Descripti...   Dressing Status New dressing applied   Dressing/Treatment   (xeroform, ABD, 2 layer compression wrap)   Offloading for Diabetic Foot Ulcers Offloading not required

## 2023-12-10 NOTE — Patient Instructions (Addendum)
Discharge Instructions for  [facility]  [address]  [place], [place] [address]  Phone: [phone] Fax: [phone]    NAME:  Jamie Bryant  DATE OF BIRTH:  29-Mar-1941  MEDICAL RECORD NUMBER:  [id number]  DATE:  December 10, 2023    [facility]    WOUND CARE ORDERS:  Bilateral Leg wounds - Cleanse with baby shampoo. Rinse and pat dry. Apply hydrocortisone cream to dry/ flaky areas and heels. Apply xeroform gauze to wounds. Cover open areas with ABD pads then superabsorber. Secure with 2 layer compression wrap. Change dressing three times a week. On weeks when visiting the wound clinic, may change twice a week at home.       Activity:  [x]  Elevate leg(s) above the level of the heart when sitting.  [x]  Avoid prolonged standing in one place.   [x]  Do no get dressing/wrap wet.       Dietary:  []  Diet as tolerated      [x]  Diabetic Diet            []  Increase Protein: examples (Meat, cheese, eggs, greek yogurt, fish, nuts)          []  Juven Therapeutic Nutrition Powder  []  Other:       Return Appointment:  [x]  Return Appointment: With Dr. Jacklynn Bue in 1 week.   []  Nurse Visit :   []  Ordered tests:          Wound Care Center Information: Should you experience any significant changes in your wound(s) or have questions about your wound care, please contact the [facility] at [facility] - FRIDAY 8:00 am - 4:30.  If you need help with your wound outside these hours and cannot wait until we are again available, contact your PCP or go to the hospital emergency room.     PLEASE NOTE: IF YOU ARE UNABLE TO OBTAIN WOUND SUPPLIES, CONTINUE TO USE THE SUPPLIES YOU HAVE AVAILABLE UNTIL YOU ARE ABLE TO REACH [place]. IT IS MOST IMPORTANT TO KEEP THE WOUND COVERED AT ALL TIMES.     Physician Signature:_______________________    Date: ___________ Time:  ____________

## 2023-12-14 ENCOUNTER — Inpatient Hospital Stay
Admit: 2023-12-14 | Disposition: A | Discharge status: Home / Self Care | Payer: MEDICARE | Source: Ambulatory Visit | Primary: Internal Medicine

## 2023-12-14 DIAGNOSIS — M5126 Other intervertebral disc displacement, lumbar region: Principal | ICD-10-CM

## 2023-12-17 ENCOUNTER — Inpatient Hospital Stay
Admit: 2023-12-17 | Discharge: 2023-12-17 | Disposition: A | Discharge status: Home / Self Care | Payer: MEDICARE | Attending: Radiation Oncology | Admitting: Radiation Oncology | Primary: Internal Medicine

## 2023-12-17 VITALS — BP 134/73 | HR 85 | Temp 98.60000°F | Resp 18

## 2023-12-17 DIAGNOSIS — I872 Venous insufficiency (chronic) (peripheral): Principal | ICD-10-CM

## 2023-12-17 NOTE — Other (Signed)
 12/17/23 1110   Right Leg Edema Point of Measurement   Leg circumference 41.5 cm   Ankle circumference 24 cm   Compression Therapy 2 layer compression wrap   Left Leg Edema Point of Measurement   Leg circumference 42.5 cm   Ankle circumference 24 cm   C

## 2023-12-17 NOTE — Patient Instructions (Addendum)
 Discharge Instructions for  [facility]  [address]  [place], [place] [address]  Phone: [phone] Fax: [phone]    NAME:  Jamie Bryant  DATE OF BIRTH:  23-Nov-1941  MEDICAL RECORD NUMBER:  [id number]  DATE:  December 17, 2023

## 2023-12-17 NOTE — Other (Signed)
 12/17/23 1159   Right Leg Edema Point of Measurement   Compression Therapy 2 layer compression wrap   Left Leg Edema Point of Measurement   Compression Therapy 2 layer compression wrap   Wound 06/18/23 Leg Right Circumferential   Date First Assessed/Tim

## 2023-12-31 ENCOUNTER — Ambulatory Visit: Payer: MEDICARE | Attending: Radiation Oncology | Primary: Internal Medicine

## 2024-01-05 NOTE — Telephone Encounter (Signed)
 Start PA for Repatha

## 2024-01-05 NOTE — Telephone Encounter (Signed)
PA not required for medication

## 2024-01-14 ENCOUNTER — Encounter

## 2024-01-14 NOTE — Telephone Encounter (Signed)
Pmp okay to fill lyrica

## 2024-01-16 MED ORDER — PREGABALIN 300 MG PO CAPS
300 | ORAL_CAPSULE | Freq: Every day | ORAL | 3 refills | Status: AC
Start: 2024-01-16 — End: 2025-01-14

## 2024-01-26 ENCOUNTER — Ambulatory Visit: Payer: MEDICARE | Attending: Internal Medicine | Primary: Internal Medicine

## 2024-02-04 ENCOUNTER — Inpatient Hospital Stay: Admit: 2024-02-04 | Discharge: 2024-02-04 | Payer: MEDICARE | Attending: Radiation Oncology | Primary: Internal Medicine

## 2024-02-04 VITALS — BP 144/77 | HR 69 | Temp 97.70000°F | Resp 16

## 2024-02-04 DIAGNOSIS — I87311 Chronic venous hypertension (idiopathic) with ulcer of right lower extremity: Secondary | ICD-10-CM

## 2024-02-04 MED ORDER — HYDROCORTISONE 2.5 % EX CREA
2.5 | CUTANEOUS | 1 refills | Status: AC
Start: 2024-02-04 — End: ?

## 2024-02-04 NOTE — Progress Notes (Addendum)
[facility]   [facility]   Medical Staff Progress Note     Jamie Bryant  MEDICAL RECORD NUMBER:  [id number]  AGE: 83 y.o.   GENDER: female  DOB: 1941/08/07  EPISODE DATE:  02/04/2024    Chief complaint and reason for visit:     Chief Complaint   Patient presents  for follow up of her recurrent bilateral lower leg venous ulcers     Follow-up     "Wound care"      Patient presenting for follow up evaluation of wound(s) per chief complaint.      Subjective and ROS:  Jamie Bryant,  a 83 yo female with history of DM 2; HTN, CVA CHF, Parkinson's, delusion, chronic asthma, alleged dementia, CRF and chronic venous insufficiency of the lower legs, returns to the [facility] at the request of her PCP, [name],  for evaluation of her recurrent bilateral lower leg venous ulcers.   Patient was seen back in March, 2024 for wound care to her lower legs with venous ulcers. She failed to return for follow up after 3 visits and wounds incompletely healed.       Most recently according to the patient she was hospitalized at a [facility] hospital for CHF and then transferred to a nursing facility ([facility]).   After discharge or during her medical care, she developed new bilateral lower leg venous ulcers spontaneously. She complains of pain but cannot quantitated; has heavy drainage without odor. Has not use any compression stockings of late.  Claims she has some Santyl at home that was ordered earlier this year but never used.       Has diabetes and unclear about her A1c.  Chart shows A1C of 6.3 on 06/09/23.  Denies smoking.  Patient states she is allergic to Lidocaine and decline to have it used today.     June 25, 2023-  On follow up, Jamie Bryant feels worst.  Complains of the itch and pain in both the lower leg. Patient has been taking off her compression stockings off and on, not liking it or just doing it. Comes in without any dressings today.  Has new blisters that are sensitive and  pruritic.  She has taken Benadryl.   Complains of lower legs painful.  Denies any fever.  Has no SOB or chest pains.     July 09, 2023- since last seen, Jamie Bryant  has had her dressing changed at the Wound Care clinic to help get her wounds cleaned and provide the proper compression she needs.  She had been taking off her compression stocking due to discomfort or confusion.  She is allergic to lidocaine and unable to mechanically remove the slough.  Use of hydrocortisone cream and vaseline gauze and ABD and Drawtex have helped with her wound.   She returns with less edematous lower legs and more content.     Follow-up July 16, 2023-patient with bilateral venous stasis edema and dermatitis and open ulcerations but patient with dementia and difficult social situation lives by herself, is on the verge of being discharged from home health due to noncompliance.     July 30, 2023-  On follow up, the patient continues to complain of pain, "15 of 10 ".  Nothing new on the social situation.  Still decline any ability to debride the wound.  Denies any fever or chills.       August 13, 2023-  On follow up,  Jamie Bryant continues to complain of pain, "15 of 10" everyday all day.  She is having more drainage with odor; has been having her wraps changed 2 x a week. Did not get gentamicin  topical ointments or  hydrocortisone cream, stating not called in. Admits to scratching due to itch.  She denies any fever or chills.  She states she is not sure what to do. Talks about "dead" people in her apartment.  She is looking for another place to live now. Has a son in town. And a daughter in [place]?     August 27, 2023-  On follow up, Jamie Bryant returns with same complaints of pain.  However, she rates them as 10 of 10.  She claims she took 4 Tylenol pills before she came to the [facility] ( 2 at 5 am and 2 just before she came).  Complains of fluid pooling at her ankle and making them itchy.  Claims trying not to scratch.  Unclear if she is  actually wearing her compression, as there are some reports from the [facility] that at times ( nurse's contact with [facility] on their findings) there are no wrapping on her leg when they make their visits.  Patient is inconsistent on whether or not she replaces her wraps or not.  Denies any fever.  Claims she is eating well     September 10, 2023-   On follow up, Jamie Bryant returns with still pain but now an 8 of 10 albeit still a pain.  She continues with her delusion that "he" continues to inflict pain and make the wound worse.  She denies any fever.  Still complains of itch in the upper calf.       September 24, 2023-   On follow up, Jamie Bryant continues to be the same.  Pain today is a 9 of 10.  Did not like the medihoney that was applied.  Less delusional today.  Son lives in [place] and daughter in [place].  Saw [name] yesterday for wellness follow up.  Patient's last A1C  on 06/09/23 was 6.3.  She took Tylenol prior to her visit with [place] today.     October 01, 2023 - Jamie Bryant returns with complaints of the usual pain in her legs.  Today the pain level is a 15.  Complains PCP would not provide her the lyrica she wants.  Insurance only pays generic and she wants trade name.  Took some tylenol today but claims it doesn't work.  Denies any fever or chills.  Did not have any difficulty with the xeroform which was added last week.     October 08, 2023- On follow up, Jamie Bryant continues to do the same, tolerating the xeroform.Still with pain, 21/10 today, although the wounds appear improved.  Took tylenol today prior to her visit. Delusional  with "guy upstairs" still inflicting pain and creating wounds.     October 15, 2023-  On follow up, Jamie Bryant returns with no new complaints  Pain level is 10/10  which is better than 21/10 last week.   Denies any fever.  Takes tylenol again prior to her visits.  Still delusional about "he" who is inflicting wounds.  Wounds are improved and she worries if "he" would make it worst now  that it is better.      October 29, 2023-  Jamie Bryant returns for follow up with complaints still of pain, 10/10, an improvement from 20/10 2 weeks ago.  Took her tylenol just before coming but still with pain,  Has more complaints of itching in the lower legs.  Runs out of hydrocortisone cream quickly, covering both her lower legs.  No rash reported by patient.  Overall, the wound does appear improved with the left drying up well except for one site.  Has no fever.      November 05, 2023-     On follow up, Jamie Bryant returns with same complaints of pain, rating 10 of 10.  Wounds however are much drier, less erythema and inflammation.  Less pain, allowing me to work on her legs.  She denies any fever.  Appears to be less delusional today.  Claims she is eating well.  Still with pruritus but complaining less.     November 19, 2023-  On follow up, Jamie Bryant returns with same pain complaints but the wounds are improving.  Pain level is 8/10 today.  Less drainage.  Still with itch.  Still complains of "he" inflicting wounds onto her legs.     December 12. 2024- On follow up, Jamie Bryant continues to complain of pain in her legs, right more so, a 10/10. She continues to believe the neighbor is inflicting new wounds.  She did not take Tylenol today before her visit with the [facility].  Denies any fever or chills. Eating ok.       December 17, 2023-  On follow up, Jamie Bryant continues to improve.  Pain level is about the same although reports an 9/10 at rest and 15 when she is here at [facility]. Took her tylenol today.  Denies any fever or chills.     February 04, 2024- Jamie Bryant returns today with her legs improved.  Has been about 6 weeks since last seen.  She complains of the right lower leg pain, with "25" of 10 pain scale.  Pain made worst last night and this morning from her  "neighbor" who has been inflicting pain for a long time.  Claims she took her Tylenol prior to her visit this morning. Denies any fever.  Claims is is out  of hydrocortisone cream for her lower legs.  She still has pruritus.    History of Wound Context: Per original history and physical on this patient. 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  Minimal drainage.  Almost healed.   RIGHT- where the pain is, are patches of deep ulcers with slough, moderate drainage, surrounded by thicken dry dermis, hemosiderin stained, very sensitive today.   Treatment - Debridement is recommended.  Patient agrees  and consents.  Lidocaine not used as patient claims allergy to it.  With a 5 mm curette, an attempt to remove the slough and nonviable tissue was made.  A small percent (10%) of the lesions were cleaned.  Debridement was discontinued due to patient's pain.   Wound care - no change in plans;  cleanse with baby shampoo.  Apply hydrocortisone cream for her itch.  Continue with xeroform to the wounds and cover with ABD pads then superabsorber.   2 layer compression wrap.  Change 2 times a week  Patient continue to use hydrocortisone to control her pruritus.    Mindful of diabetic diet;   Delusion /confusion -a  contributing factor to her slow recover       Other associated diagnoses or problems addressed:  none    Pertinent imaging reviewed  including independent interpretation include:  None    Pertinent labs reviewed.   Medical records and review of external note (s) from other providers done as well.    New lab or imaging orders placed:  None     Prescription drug management:  refill hydrocortisone cream    Discussion of management or test interpretation with  patient who comes alone .    Comorbid conditions affecting wound healing: As per PMH which was reviewed.    Risk of complications and/or mortality of patient management:  This patient has a minimal risk of morbidity and mortality from additional diagnostic testing or treatment. This is due to the above conditions affecting wound healing as well as patient and procedure risk factors. Education and discussion held with patient regarding these disease processes pertinent to wound(s).  Other pertinent decisions include: minor surgery or procedures as below.  The patient's diagnosis or treatment is significantly limited by social determinants of health as noted by:  mental health .    Wound 06/18/23 Leg Right Circumferential (Active)   Wound Image    02/04/24 1044   Wound Etiology Venous 02/04/24 1044   Dressing Status New dressing applied 02/04/24 1152   Wound Cleansed Soap and water 02/04/24 1044   Dressing/Treatment Xeroform 12/17/23 1159   Offloading for Diabetic Foot Ulcers Offloading not required 02/04/24 1152   Wound Length (cm) 9.8 cm 02/04/24 1044   Wound Width (cm) 19.4 cm 02/04/24 1044   Wound Depth (cm) 0.2 cm 02/04/24 1044   Wound Surface Area (cm^2) 190.12 cm^2 02/04/24 1044   Change in Wound Size % (l*w) 50.94 02/04/24 1044   Wound Volume (cm^3) 38.024 cm^3 02/04/24 1044   Wound Healing % 51 02/04/24 1044   Wound Assessment Pink/red;Slough 02/04/24 1044   Drainage Amount Moderate (25-50%) 02/04/24 1044   Drainage Description Serosanguinous 02/04/24 1044   Odor Mild 02/04/24 1044   Peri-wound Assessment Hemosiderin staining (brown yellow);Dry/flaky 02/04/24 1044   Margins Undefined  edges 02/04/24 1044   Wound Thickness Description not for Pressure Injury Full thickness 02/04/24 1044   Number of days: 231       Wound 06/18/23 Leg Left Circumferential (Active)   Wound Image    02/04/24 1044   Wound Etiology Venous 02/04/24 1044   Dressing Status New dressing applied 02/04/24 1152   Wound Cleansed Soap and water 02/04/24 1044   Dressing/Treatment Xeroform;ABD 12/17/23 1159   Offloading for Diabetic Foot Ulcers Offloading not required 02/04/24 1152   Wound Length (cm) 2.4 cm 02/04/24 1044   Wound Width (cm) 11.2 cm 02/04/24 1044   Wound Depth (cm) 0.2 cm 02/04/24 1044   Wound  Surface Area (cm^2) 26.88 cm^2 02/04/24 1044   Change in Wound Size % (l*w) 80.47 02/04/24 1044   Wound Volume (cm^3) 5.376 cm^3 02/04/24 1044   Wound Healing % 61 02/04/24 1044   Wound Assessment Pink/red;Slough 02/04/24 1044   Drainage Amount Moderate (25-50%) 02/04/24 1044   Drainage Description Serosanguinous 02/04/24 1044   Odor None 02/04/24 1044   Peri-wound Assessment Hemosiderin staining (brown yellow);Dry/flaky;Blanchable erythema 02/04/24 1044   Margins Undefined edges 02/04/24 1044   Wound Thickness Description not for Pressure Injury Full thickness 02/04/24 1044   Number of days: 231          Procedures done during this encounter:   Debridement: Selective Excisional Debridement  Indications:  Based on my examination of this patient's wound(s)/ulcer(s) today, debridement is required to promote healing and evaluate the wound base. Risks and benefits discussed with patient who has agreed to proceed.   Performed by: Ernestina Penna, MD  Consent obtained:  Yes  Time out taken:  Yes  Pain Control:     Using curette the wound(s)/ulcer(s) was/were debrided down through and including the removal of epidermis and dermis.      Devitalized Tissue Debrided:  slough  Pre Debridement Measurements:  Are located in the Wound/Ulcer Documentation Flow Sheet  Wound/Ulcer #: 2  Post Debridement Measurements:  Wound/Ulcer Descriptions are  Pre Debridement except measurements:  Total Surface Area Debrided:  20 sq cm   Diabetic/Pressure/Non Pressure Ulcers only:  Ulcer: Non-Pressure ulcer, limited to breakdown of skin   Estimated Blood Loss:  None  Hemostasis Achieved:  not needed  Procedural Pain:  25  / 10   Post Procedural Pain:  25 / 10   Response to treatment:  Poorly tolerated by patient., With complaints of pain.     TIME: E/M Time spent with patient and/or patient care issues: []  15-20 min  [x]  21-30 min  []  31-44 min  []  45 min or more.   This is above the usual time needed to address patient's chief complaint today: []  Yes  []  No  This time includes physician non-face-to-face service time visit on the date of service such as  Preparing to see the patient (eg, review of tests)  Obtaining and/or reviewing separately obtained history  Performing a medically necessary appropriate examination and/or evaluation  Counseling and educating the patient/family/caregiver  Ordering medications, tests, or procedures  Referring and communicating with other health care professionals as needed  Documenting clinical information in the electronic or other health record  Independently interpreting results (not reported separately) and communicating results to the patient/family/caregiver  Care coordination (not reported separately)    Objective:    BP (!) 144/77   Pulse 69   Temp 97.7 F (36.5 C) (Temporal)   Resp 16   Wt Readings from Last 3 Encounters:   11/17/23 99.6 kg (219 lb 8 oz)   09/14/23 100 kg (220 lb 8 oz)   07/10/23 98 kg (216 lb 1.6 oz)       PHYSICAL EXAM  General: Alert and in no acute distress. Obese; in pain today, shaking her right leg on occasion when she starts complaining of pain, even without anyone touching leg.  Skin: Warm and dry, no rash  Head: Normocephalic and atraumatic  Eyes: Extraocular eye movements intact, conjunctivae normal, and sclera anicteric  ENT: Hearing grossly normal bilaterally. Normal appearance  Respiratory:  no  respiratory distress  Musculoskeletal: Baseline range of motion in joints. Nontender calves. No cyanosis. Edema 1+.  Bilateral lower leg- overall there is an improvement with more dry dermis.  Right leg with majority of open ulcers, draining and most sensitive.  See images and description  Neurologic: Speech normal. At baseline without new focal deficits. Mental status normal or at baseline, delusional at times    PAST MEDICAL HISTORY        Diagnosis Date    Arthritis     Asthma     Chronic renal disease, stage III (HCC) [[id number]] 10/13/2022    Depression     Diabetes (HCC)     Fibromyalgia     Fibromyalgia     Gastroparesis     Hyperlipemia     Hypertension     Neuropathy     Psychotic disorder (HCC)     Stroke (HCC)        PAST SURGICAL HISTORY    Past Surgical History:   Procedure Laterality Date    COLONOSCOPY      HYSTERECTOMY (CERVIX STATUS UNKNOWN)         FAMILY HISTORY    Family History   Problem Relation Age of Onset    Breast Cancer Maternal Grandmother     Cancer Father         lung ca    Hypertension Father     Diabetes Mother     Hypertension Mother        SOCIAL HISTORY    Social History     Tobacco Use    Smoking status: Never     Passive exposure: Never    Smokeless tobacco: Never   Vaping Use    Vaping status: Never Used   Substance Use Topics    Alcohol use: No    Drug use: No       ALLERGIES    Allergies   Allergen Reactions    Iodinated Contrast Media Shortness Of Breath    Statins Myalgia     Lovastatin, pravastatin, simvastatin, atorvastatin, zetia, livalo, lovaza    Adhesive Tape Rash    Ampicillin Rash    Gabapentin Nausea And Vomiting    Lidocaine Rash       MEDICATIONS    Current Outpatient Medications on File Prior to Encounter   Medication Sig Dispense Refill    pregabalin (LYRICA) 300 MG capsule Take 1 capsule by mouth daily for 364 days. DAW BRAND NAME ONLY Max Daily Amount: 300 mg 90 capsule 3    aspirin 81 MG chewable tablet Take 1 tablet by mouth daily 30 tablet 11    hydrocortisone  2.5 % cream Apply topically to affected areas in both lower legs with each change of wound dressing 56 g 2    Continuous Glucose Sensor (FREESTYLE LIBRE 2 SENSOR) MISC Blood sugar checks before meals and at bedtime and as needed (Patient not taking: Reported on 10/29/2023) 2 each 11    hydrocortisone 2.5 % cream Apply topically to affected area with each wound dressing change 20 g 1    SITagliptin (JANUVIA) 100 MG tablet Take 1 tablet by mouth daily 90 tablet 3    hydrocortisone 2.5 % cream Apply topically topically to areas of pruritus with each change of dressing 3.5 g 1    REPATHA SURECLICK 140 MG/ML SOAJ INJECT AS DIRECTED EVERY 2 WEEKS 2 mL 11    omeprazole (PRILOSEC) 40 MG delayed release capsule Take 1 capsule by mouth every morning (before breakfast) (Patient not taking: Reported on 09/10/2023) 30 capsule 0    lisinopril (PRINIVIL;ZESTRIL) 40  MG tablet Take 1 tablet by mouth daily (Patient not taking: Reported on 11/19/2023)      quinapril (ACCUPRIL) 40 MG tablet Take 1 tablet by mouth daily (Patient not taking: Reported on 04/20/2023)  0    torsemide (DEMADEX) 20 MG tablet Take 5 tablets by mouth daily      linaclotide (LINZESS) 290 MCG CAPS capsule Take 1 capsule by mouth every morning (before breakfast) (Patient not taking: Reported on 10/01/2023) 30 capsule     beclomethasone (QVAR REDIHALER) 40 MCG/ACT AERB inhaler Inhale 1 puff into the lungs in the morning and 1 puff in the evening.      amLODIPine (NORVASC) 5 MG tablet Take 1 tablet by mouth daily  0    PARoxetine (PAXIL) 10 MG tablet Take 1 tablet by mouth daily (Patient not taking: Reported on 06/18/2023)  0    albuterol-ipratropium (COMBIVENT RESPIMAT) 20-100 MCG/ACT AERS inhaler Inhale 1 puff into the lungs every 6 hours      insulin glargine (LANTUS) 100 UNIT/ML injection vial Inject 14 Units into the skin nightly (Patient not taking: Reported on 11/19/2023) 10 mL 3    insulin lispro (HUMALOG) 100 UNIT/ML SOLN injection vial Inject 0-4 Units into  the skin 4 times daily (before meals and nightly) (Patient not taking: Reported on 11/19/2023)      linezolid (ZYVOX) 600 MG tablet Take 1 tablet by mouth 2 times daily Bid FOR 14 DAYS (Patient not taking: Reported on 03/05/2023)      arformoterol tartrate 15 MCG/2ML NEBU 15 mcg, budesonide 0.25 MG/2ML SUSP 250 mcg Take 1 Dose by nebulization in the morning and 1 Dose in the evening. (Patient not taking: Reported on 12/18/2022) 60 Units 11    albuterol sulfate HFA (VENTOLIN HFA) 108 (90 Base) MCG/ACT inhaler Inhale 2 puffs into the lungs 4 times daily as needed for Wheezing (Patient not taking: Reported on 11/17/2023) 54 g 1    albuterol (PROVENTIL) (2.5 MG/3ML) 0.083% nebulizer solution Take 3 mLs by nebulization 4 times daily (Patient not taking: Reported on 04/20/2023) 120 each 3    QUEtiapine (SEROQUEL) 25 MG tablet Take 0.5 tablets by mouth nightly (Patient not taking: Reported on 09/14/2023) 60 tablet 3    arformoterol tartrate (BROVANA) 15 MCG/2ML NEBU Take 1 ampule by nebulization 2 times daily (Patient not taking: Reported on 12/18/2022) 120 mL 3    budesonide (PULMICORT) 0.25 MG/2ML nebulizer suspension Take 2 mLs by nebulization 2 times daily 60 each 3    albuterol sulfate HFA (VENTOLIN HFA) 108 (90 Base) MCG/ACT inhaler Inhale 2 puffs into the lungs 4 times daily as needed for Wheezing 18 g 0    carvedilol (COREG) 12.5 MG tablet Take 1 tablet by mouth 2 times daily      quinapril (ACCUPRIL) 40 MG tablet quinapril 40 mg tablet   TAKE 1 TABLET BY MOUTH EVERY NIGHT FOR HIGH BLOOD PRESSURE 90 tablet 3    insulin lispro, 1 Unit Dial, (HUMALOG/ADMELOG) 100 UNIT/ML SOPN  (Patient not taking: Reported on 10/08/2023)      ipratropium (ATROVENT HFA) 17 MCG/ACT inhaler       linaclotide (LINZESS) 290 MCG CAPS capsule Take 1 capsule by mouth as needed (Patient not taking: Reported on 06/09/2023)      olopatadine (PATANOL) 0.1 % ophthalmic solution Apply 2 drops to eye 2 times daily (Patient not taking: Reported on  04/20/2023)       No current facility-administered medications on file prior to encounter.  Written patient dismissal instructions given to patient and signed by patient or POA.         Electronically signed by Ernestina Penna, MD on 02/04/2024 at 1:31 PM

## 2024-02-04 NOTE — Other (Signed)
02/04/24 1044   Right Leg Edema Point of Measurement   Leg circumference 41 cm   Ankle circumference 24 cm   Compression Therapy 2 layer compression wrap   Left Leg Edema Point of Measurement   Leg circumference 41 cm   Ankle circumference 25 cm   Compression Therapy 2 layer compression wrap   Peripheral Vascular   RLE Edema +2;Pitting   LLE Edema +1;Pitting   RLE Neurovascular Assessment   Capillary Refill Less than/Equal to 3 seconds   Color Yellow-Brown/Hemosiderin Staining   Temperature Warm   RLE Sensation  Full sensation   R Pedal Pulse +1   LLE Neurovascular Assessment   Capillary Refill Less than/Equal to 3 seconds   Color Yellow-Brown/Hemosiderin Staining   Temperature Warm   LLE Sensation  Full sensation   L Pedal Pulse +2   Wound 06/18/23 Leg Right Circumferential   Date First Assessed/Time First Assessed: 06/18/23 0900   Present on Original Admission: Yes  Wound Approximate Age at First Assessment (Weeks): 4 weeks  Primary Wound Type: Venous Ulcer  Location: Leg  Wound Location Orientation: Right  Wound Descript...   Wound Image     Wound Etiology Venous   Dressing Status Old drainage noted   Wound Cleansed Soap and water   Offloading for Diabetic Foot Ulcers Offloading not required   Wound Length (cm) 9.8 cm   Wound Width (cm) 19.4 cm   Wound Depth (cm) 0.2 cm   Wound Surface Area (cm^2) 190.12 cm^2   Change in Wound Size % (l*w) 50.94   Wound Volume (cm^3) 38.024 cm^3   Wound Healing % 51   Wound Assessment Pink/red;Slough   Drainage Amount Moderate (25-50%)   Drainage Description Serosanguinous   Odor Mild   Peri-wound Assessment Hemosiderin staining (brown yellow);Dry/flaky   Margins Undefined edges   Wound Thickness Description not for Pressure Injury Full thickness   Wound 06/18/23 Leg Left Circumferential   Date First Assessed/Time First Assessed: 06/18/23 0902   Present on Original Admission: Yes  Wound Approximate Age at First Assessment (Weeks): 4 weeks  Primary Wound Type: Venous Ulcer   Location: Leg  Wound Location Orientation: Left  Wound Descripti...   Wound Image     Wound Etiology Venous   Dressing Status Old drainage noted   Wound Cleansed Soap and water   Offloading for Diabetic Foot Ulcers Offloading not required   Wound Length (cm) 2.4 cm   Wound Width (cm) 11.2 cm   Wound Depth (cm) 0.2 cm   Wound Surface Area (cm^2) 26.88 cm^2   Change in Wound Size % (l*w) 80.47   Wound Volume (cm^3) 5.376 cm^3   Wound Healing % 61   Wound Assessment Pink/red;Slough   Drainage Amount Moderate (25-50%)   Drainage Description Serosanguinous   Odor None   Peri-wound Assessment Hemosiderin staining (brown yellow);Dry/flaky;Blanchable erythema   Margins Undefined edges   Wound Thickness Description not for Pressure Injury Full thickness   Pain Assessment   Pain Assessment 0-10   Pain Level 4   Patient's Stated Pain Goal 0 - No pain   Pain Location Leg   Pain Orientation Right;Left   Pain Descriptors Discomfort   BP (!) 144/77   Pulse 69   Temp 97.7 F (36.5 C) (Temporal)   Resp 16

## 2024-02-04 NOTE — Patient Instructions (Addendum)
Discharge Instructions for  [facility]  [address]  [place], [place] [address]  Phone: [phone] Fax: [phone]    NAME:  Jamie Bryant  DATE OF BIRTH:  [birth year]  MEDICAL RECORD NUMBER:  [id number]  DATE:  February 04, 2024    [facility]    WOUND CARE ORDERS:  Bilateral Leg wounds - Cleanse with baby shampoo. Rinse and pat dry. Apply hydrocortisone cream to dry/ flaky areas and heels. Apply xeroform gauze to wounds. Cover open areas with ABD pad then superabsorber. Secure with 2 layer compression wrap. Change dressing three times a week. On weeks when visiting the wound clinic, may change twice a week at home.       Activity:  [x]  Elevate leg(s) above the level of the heart when sitting.  [x]  Avoid prolonged standing in one place.   [x]  Do no get dressing/wrap wet.       Dietary:  []  Diet as tolerated      [x]  Diabetic Diet            []  Increase Protein: examples (Meat, cheese, eggs, greek yogurt, fish, nuts)          []  Juven Therapeutic Nutrition Powder  []  Other:       Return Appointment:  [x]  Return Appointment: With Dr. Jacklynn Bue in 4 weeks.   []  Nurse Visit :   []  Ordered tests:        Wound Care Center Information: Should you experience any significant changes in your wound(s) or have questions about your wound care, please contact the [facility] at [facility] [place] [facility] - FRIDAY 8:00 am - 4:30.  If you need help with your wound outside these hours and cannot wait until we are again available, contact your PCP or go to the hospital emergency room.     PLEASE NOTE: IF YOU ARE UNABLE TO OBTAIN WOUND SUPPLIES, CONTINUE TO USE THE SUPPLIES YOU HAVE AVAILABLE UNTIL YOU ARE ABLE TO REACH [place]. IT IS MOST IMPORTANT TO KEEP THE WOUND COVERED AT ALL TIMES.     Physician Signature:_______________________    Date: ___________ Time:  ____________

## 2024-02-04 NOTE — Other (Signed)
02/04/24 1152   Right Leg Edema Point of Measurement   Compression Therapy 2 layer compression wrap   Left Leg Edema Point of Measurement   Compression Therapy 2 layer compression wrap   Wound 06/18/23 Leg Right Circumferential   Date First Assessed/Time First Assessed: 06/18/23 0900   Present on Original Admission: Yes  Wound Approximate Age at First Assessment (Weeks): 4 weeks  Primary Wound Type: Venous Ulcer  Location: Leg  Wound Location Orientation: Right  Wound Descript...   Dressing Status New dressing applied   Dressing/Treatment   (xeroform, ABD two layer compression wrap)   Offloading for Diabetic Foot Ulcers Offloading not required   Wound 06/18/23 Leg Left Circumferential   Date First Assessed/Time First Assessed: 06/18/23 0902   Present on Original Admission: Yes  Wound Approximate Age at First Assessment (Weeks): 4 weeks  Primary Wound Type: Venous Ulcer  Location: Leg  Wound Location Orientation: Left  Wound Descripti...   Dressing Status New dressing applied   Dressing/Treatment   (xeroform, ABD two layer compression wrap)   Offloading for Diabetic Foot Ulcers Offloading not required

## 2024-02-04 NOTE — IP Home Care (Signed)
Spoke with home health RN who was requesting to decrease visits from 3x a week to 2x a week . Spoke with Dr. Claudie Fisherman who agreed to the decrease in visits until she returns to clinic for a follow up.

## 2024-03-03 ENCOUNTER — Inpatient Hospital Stay: Admit: 2024-03-03 | Discharge: 2024-03-03 | Payer: MEDICARE | Attending: Radiation Oncology | Primary: Internal Medicine

## 2024-03-03 MED ORDER — GENTAMICIN SULFATE 0.1 % EX OINT
0.1 | CUTANEOUS | 0 refills | Status: AC
Start: 2024-03-03 — End: 2024-03-10

## 2024-03-03 MED ORDER — CIPROFLOXACIN HCL 500 MG PO TABS
500 | ORAL_TABLET | Freq: Two times a day (BID) | ORAL | 0 refills | Status: AC
Start: 2024-03-03 — End: 2024-03-13

## 2024-03-03 MED ORDER — HYDROCORTISONE 2.5 % EX CREA
2.5 | CUTANEOUS | 5 refills | Status: AC
Start: 2024-03-03 — End: ?

## 2024-03-03 NOTE — Other (Signed)
 03/03/24 1139   Right Leg Edema Point of Measurement   Compression Therapy 2 layer compression wrap   Left Leg Edema Point of Measurement   Compression Therapy 2 layer compression wrap   Wound 06/18/23 Leg Right Circumferential   Date First Assessed/Time First Assessed: 06/18/23 0900   Present on Original Admission: Yes  Wound Approximate Age at First Assessment (Weeks): 4 weeks  Primary Wound Type: Venous Ulcer  Location: Leg  Wound Location Orientation: Right  Wound Descript...   Dressing Status New dressing applied   Dressing/Treatment Other (comment)  (hydrocortisone; adaptic; ABD; optilock; 2l ayer wrap)   Wound 06/18/23 Leg Left Circumferential   Date First Assessed/Time First Assessed: 06/18/23 0902   Present on Original Admission: Yes  Wound Approximate Age at First Assessment (Weeks): 4 weeks  Primary Wound Type: Venous Ulcer  Location: Leg  Wound Location Orientation: Left  Wound Descripti...   Dressing Status New dressing applied   Dressing/Treatment Other (comment)  (hydrocortisone; adaptic; ABD; optilock; 2l ayer wrap)     Multilayer Compression Wrap   (Not Unna) Below the Knee    NAME:  Jamie Bryant  DATE OF BIRTH:  [birth year]  MEDICAL RECORD NUMBER:  [id number]  DATE:  March 03, 2024    Removed old dressing; wash with soap and water, Applied primary and secondary dressing as ordered, Placed compression to right lower leg, Place compression to left lower leg, Instructed patient/caregiver not to remove dressing and to keep it clean and dry, Instructed patient/caregiver on complications to report to provider, such as pain, numbness in toes, heavy drainage, and slippage of dressing, and Instructed patient/caregiver on purpose of compression dressing and on activity and exercise recommendations    Response to treatment: Patient tolerated treatment with mild discomfort but relieved after procedure was completed      Electronically signed by Nolon Lennert, RN on 03/03/2024 at 11:41 AM

## 2024-03-03 NOTE — Other (Signed)
 03/03/24 1051   Right Leg Edema Point of Measurement   Leg circumference 42 cm   Ankle circumference 24 cm   Compression Therapy 2 layer compression wrap   Left Leg Edema Point of Measurement   Leg circumference 37 cm   Ankle circumference 23 cm   Compression Therapy 2 layer compression wrap   Peripheral Vascular   RLE Edema +1;Non-pitting   LLE Edema +1;Pitting   RLE Neurovascular Assessment   Capillary Refill Less than/Equal to 3 seconds   Color Yellow-Brown/Hemosiderin Staining   Temperature Warm   RLE Sensation  Full sensation   R Pedal Pulse +1   LLE Neurovascular Assessment   Capillary Refill Less than/Equal to 3 seconds   Color Yellow-Brown/Hemosiderin Staining   Temperature Warm   LLE Sensation  Full sensation   L Pedal Pulse +2   Wound 06/18/23 Leg Right Circumferential   Date First Assessed/Time First Assessed: 06/18/23 0900   Present on Original Admission: Yes  Wound Approximate Age at First Assessment (Weeks): 4 weeks  Primary Wound Type: Venous Ulcer  Location: Leg  Wound Location Orientation: Right  Wound Descript...   Wound Image      Wound Etiology Venous   Dressing Status Old drainage noted   Wound Cleansed Soap and water   Offloading for Diabetic Foot Ulcers Offloading not required   Wound Length (cm) 9 cm   Wound Width (cm) 30 cm   Wound Depth (cm) 0.3 cm   Wound Surface Area (cm^2) 270 cm^2   Change in Wound Size % (l*w) 30.32   Wound Volume (cm^3) 81 cm^3   Wound Healing % -5   Wound Assessment Pink/red;Slough;Hyper granulation tissue   Drainage Amount Moderate (25-50%)   Drainage Description Serosanguinous   Odor None   Peri-wound Assessment Hemosiderin staining (brown yellow)   Margins Undefined edges   Wound Thickness Description not for Pressure Injury Full thickness   Wound 06/18/23 Leg Left Circumferential   Date First Assessed/Time First Assessed: 06/18/23 0902   Present on Original Admission: Yes  Wound Approximate Age at First Assessment (Weeks): 4 weeks  Primary Wound Type: Venous  Ulcer  Location: Leg  Wound Location Orientation: Left  Wound Descripti...   Wound Image    Wound Etiology Venous   Dressing Status Old drainage noted   Wound Cleansed Soap and water   Offloading for Diabetic Foot Ulcers Offloading not required   Wound Length (cm) 2.4 cm   Wound Width (cm) 2.5 cm   Wound Depth (cm) 0.2 cm   Wound Surface Area (cm^2) 6 cm^2   Change in Wound Size % (l*w) 95.64   Wound Volume (cm^3) 1.2 cm^3   Wound Healing % 91   Wound Assessment Pink/red;Slough   Drainage Amount Moderate (25-50%)   Drainage Description Serosanguinous   Odor None   Peri-wound Assessment Hemosiderin staining (brown yellow)   Margins Undefined edges   Wound Thickness Description not for Pressure Injury Full thickness   Pain Assessment   Pain Assessment 0-10   Pain Level 4   Patient's Stated Pain Goal 0 - No pain   Pain Location Leg   Pain Orientation Right;Left   Pain Descriptors Discomfort   BP (!) 166/71   Pulse 93   Temp 98.7 F (37.1 C) (Temporal)   Resp 18

## 2024-03-03 NOTE — Patient Instructions (Addendum)
 Discharge Instructions for  [facility]  [address]  [place], [place] [address]  Phone: [phone] Fax: [phone]    NAME:  Jamie Bryant  DATE OF BIRTH:  25-Jul-1941  MEDICAL RECORD NUMBER:  [id number]  DATE:  March 03, 2024    [facility]: [facility]    WOUND CARE ORDERS:  Bilateral Leg wounds - Cleanse with baby shampoo. Rinse and pat dry. Apply hydrocortisone cream to dry/ flaky areas and heels. Apply gentamicin ointment and adaptic to wounds. Cover open areas with ABD pad then superabsorber. Secure with 2 layer compression wrap. Change dressing three times a week. On weeks when visiting the wound clinic, may change twice a week at home.       Activity:  [x]  Elevate leg(s) above the level of the heart when sitting.  [x]  Avoid prolonged standing in one place.   [x]  Do no get dressing/wrap wet.       Dietary:  []  Diet as tolerated      [x]  Diabetic Diet            []  Increase Protein: examples (Meat, cheese, eggs, greek yogurt, fish, nuts)          []  Juven Therapeutic Nutrition Powder  []  Other:       Return Appointment:  [x]  Return Appointment: With Dr. Jacklynn Bue in 4 weeks.   []  Nurse Visit :   []  Ordered tests:          Wound Care Center Information: Should you experience any significant changes in your wound(s) or have questions about your wound care, please contact the [facility] MONDAY - THURSDAY 8:00AM - 4:00PM and FRIDAY 8:00AM - NOON at the number listed above. If you need help with your wound outside these hours and cannot wait until we are again available, contact your PCP or go to the hospital emergency room.       PLEASE NOTE: IF YOU ARE UNABLE TO OBTAIN WOUND SUPPLIES, CONTINUE TO USE THE SUPPLIES YOU HAVE AVAILABLE UNTIL YOU ARE ABLE TO REACH [place]. IT IS MOST IMPORTANT TO KEEP THE WOUND COVERED AT ALL TIMES.     Physician Signature:_______________________    Date: ___________ Time:  ____________

## 2024-03-07 ENCOUNTER — Ambulatory Visit: Admit: 2024-03-07 | Discharge: 2024-03-07 | Payer: MEDICARE | Attending: Internal Medicine | Primary: Internal Medicine

## 2024-03-07 VITALS — BP 112/63 | HR 65 | Temp 98.30000°F | Resp 16 | Ht 64.0 in | Wt 214.2 lb

## 2024-03-07 DIAGNOSIS — J45901 Unspecified asthma with (acute) exacerbation: Secondary | ICD-10-CM

## 2024-03-07 MED ORDER — IPRATROPIUM-ALBUTEROL 20-100 MCG/ACT IN AERS
20-100 | Freq: Four times a day (QID) | RESPIRATORY_TRACT | 11 refills | Status: AC
Start: 2024-03-07 — End: ?

## 2024-03-07 MED ORDER — BECLOMETHASONE DIPROP HFA 40 MCG/ACT IN AERB
40 | Freq: Two times a day (BID) | RESPIRATORY_TRACT | 11 refills | Status: AC
Start: 2024-03-07 — End: ?

## 2024-03-07 MED ORDER — PREGABALIN 300 MG PO CAPS
300 | ORAL_CAPSULE | Freq: Two times a day (BID) | ORAL | 4 refills | Status: AC
Start: 2024-03-07 — End: 2025-03-06

## 2024-03-07 NOTE — Assessment & Plan Note (Signed)
 Monitored by specialist- no acute findings meriting change in the plan

## 2024-03-07 NOTE — Progress Notes (Signed)
 Chief Complaint   Patient presents with    Hypertension    Diabetes     Patient states " she is present for a follow up. Patient states " she is having pain in her back, knees, legs."    "Have you been to the ER, urgent care clinic since your last visit?  Hospitalized since your last visit?"    NO    "Have you seen or consulted any other health care providers outside our system since your last visit?"    NO

## 2024-03-07 NOTE — Telephone Encounter (Signed)
 Start PA for [facility] sensors

## 2024-03-08 LAB — HEMOGLOBIN A1C
Estimated Avg Glucose: 148 mg/dL
Hemoglobin A1C: 6.8 % — ABNORMAL HIGH (ref 4.0–5.6)

## 2024-03-15 NOTE — Progress Notes (Signed)
 [facility]  [address]  [address] [place] [address]  Phone:  [phone]  Fax: [phone]       Chief Complaint   Patient presents with    Hypertension    Diabetes   .      SUBJECTIVE:      History of Present Illness  The patient presents for evaluation of diabetes and diabetic neuropathy.    She is seeking a prescription for [facility] 2 sensors, which are necessary for her continuous glucose monitoring. She has been informed by her pharmacist that she requires documentation from her physician to facilitate insurance coverage for these sensors. Additionally, she mentions that the cost of each sensor is $ 80, and she is uncertain about her insurance's coverage policy.    She reports persistent leg pain, which significantly impairs her mobility. She is currently on a regimen of Lyrica 300 mg twice daily for the management of diabetic neuropathy in her legs. However, she has been advised by her nurse to consult with her physician regarding a potential adjustment in her Lyrica dosage due to its impact on her leg nerves. She receives home visits from nurses twice weekly.    MEDICATIONS  Current: Pregabalin (Lyrica)         Current Outpatient Medications   Medication Sig Dispense Refill    beclomethasone (QVAR REDIHALER) 40 MCG/ACT AERB inhaler Inhale 1 puff into the lungs in the morning and 1 puff in the evening. 18 g 11    albuterol-ipratropium (COMBIVENT RESPIMAT) 20-100 MCG/ACT AERS inhaler Inhale 1 puff into the lungs every 6 hours 18 g 11    pregabalin (LYRICA) 300 MG capsule Take 1 capsule by mouth 2 times daily for 364 days. DAW BRAND NAME ONLY Max Daily Amount: 600 mg 60 capsule 4    hydrocortisone 2.5 % cream Apply topically with each change of dressing 28 g 5    aspirin 81 MG chewable tablet Take 1 tablet by mouth daily 30 tablet 11    SITagliptin (JANUVIA) 100 MG tablet Take 1 tablet by mouth daily 90 tablet 3    REPATHA SURECLICK 140 MG/ML SOAJ INJECT AS DIRECTED EVERY  2 WEEKS 2 mL 11    torsemide (DEMADEX) 20 MG tablet Take 5 tablets by mouth daily      amLODIPine (NORVASC) 5 MG tablet Take 1 tablet by mouth daily  0    insulin glargine (LANTUS) 100 UNIT/ML injection vial Inject 14 Units into the skin nightly 10 mL 3    insulin lispro (HUMALOG) 100 UNIT/ML SOLN injection vial Inject 0-4 Units into the skin 4 times daily (before meals and nightly)      budesonide (PULMICORT) 0.25 MG/2ML nebulizer suspension Take 2 mLs by nebulization 2 times daily 60 each 3    albuterol sulfate HFA (VENTOLIN HFA) 108 (90 Base) MCG/ACT inhaler Inhale 2 puffs into the lungs 4 times daily as needed for Wheezing 18 g 0    carvedilol (COREG) 12.5 MG tablet Take 1 tablet by mouth 2 times daily      quinapril (ACCUPRIL) 40 MG tablet quinapril 40 mg tablet   TAKE 1 TABLET BY MOUTH EVERY NIGHT FOR HIGH BLOOD PRESSURE 90 tablet 3    Continuous Glucose Sensor (FREESTYLE LIBRE 2 SENSOR) MISC Blood sugar checks before meals and at bedtime and as needed 2 each 11    hydrocortisone 2.5 % cream Apply topically to affected area with each wound dressing change (Patient  not taking: Reported on 03/03/2024) 20 g 1    omeprazole (PRILOSEC) 40 MG delayed release capsule Take 1 capsule by mouth every morning (before breakfast) (Patient not taking: Reported on 03/07/2024) 30 capsule 0    lisinopril (PRINIVIL;ZESTRIL) 40 MG tablet Take 1 tablet by mouth daily (Patient not taking: Reported on 03/07/2024)      linaclotide (LINZESS) 290 MCG CAPS capsule Take 1 capsule by mouth every morning (before breakfast) (Patient not taking: Reported on 03/07/2024) 30 capsule     PARoxetine (PAXIL) 10 MG tablet Take 1 tablet by mouth daily (Patient not taking: Reported on 03/07/2024)  0    linezolid (ZYVOX) 600 MG tablet Take 1 tablet by mouth 2 times daily Bid FOR 14 DAYS (Patient not taking: Reported on 03/07/2024)      arformoterol tartrate 15 MCG/2ML NEBU 15 mcg, budesonide 0.25 MG/2ML SUSP 250 mcg Take 1 Dose by nebulization in the morning  and 1 Dose in the evening. (Patient not taking: Reported on 03/07/2024) 60 Units 11    albuterol sulfate HFA (VENTOLIN HFA) 108 (90 Base) MCG/ACT inhaler Inhale 2 puffs into the lungs 4 times daily as needed for Wheezing (Patient not taking: Reported on 03/07/2024) 54 g 1    albuterol (PROVENTIL) (2.5 MG/3ML) 0.083% nebulizer solution Take 3 mLs by nebulization 4 times daily (Patient not taking: Reported on 03/07/2024) 120 each 3    QUEtiapine (SEROQUEL) 25 MG tablet Take 0.5 tablets by mouth nightly (Patient not taking: Reported on 03/07/2024) 60 tablet 3    arformoterol tartrate (BROVANA) 15 MCG/2ML NEBU Take 1 ampule by nebulization 2 times daily (Patient not taking: Reported on 03/07/2024) 120 mL 3    ipratropium (ATROVENT HFA) 17 MCG/ACT inhaler       olopatadine (PATANOL) 0.1 % ophthalmic solution Apply 2 drops to eye 2 times daily (Patient not taking: Reported on 03/07/2024)       No current facility-administered medications for this visit.     Past Medical History:   Diagnosis Date    Arthritis     Asthma     Chronic renal disease, stage III [facility]) [[id number]] 10/13/2022    Depression     Diabetes (HCC)     Fibromyalgia     Fibromyalgia     Gastroparesis     Hyperlipemia     Hypertension     Neuropathy     Psychotic disorder (HCC)     Stroke [facility])      Past Surgical History:   Procedure Laterality Date    COLONOSCOPY      HYSTERECTOMY (CERVIX STATUS UNKNOWN)       Allergies   Allergen Reactions    Iodinated Contrast Media Shortness Of Breath    Statins Myalgia     Lovastatin, pravastatin, simvastatin, atorvastatin, zetia, livalo, lovaza    Adhesive Tape Rash    Ampicillin Rash    Gabapentin Nausea And Vomiting    Lidocaine Rash         REVIEW OF SYSTEMS:  General: negative for - chills or fever  ENT: negative for - headaches, nasal congestion or tinnitus  Respiratory: negative for - cough, hemoptysis, shortness of breath or wheezing  Cardiovascular : negative for - chest pain, edema, palpitations or shortness of  breath  Gastrointestinal: negative for - abdominal pain, blood in stools, heartburn or nausea/vomiting  Genito-Urinary: no dysuria, trouble voiding, or hematuria  Musculoskeletal: negative for - gait disturbance, joint pain, joint stiffness or joint swelling  Neurological: no TIA or  stroke symptoms  Hematologic: no bruises, no bleeding, no swollen glands  Integument: no lumps, mole changes, nail changes or rash  Endocrine: no malaise/lethargy or unexpected weight changes      Social History     Socioeconomic History    Marital status: Divorced     Spouse name: None    Number of children: 2    Years of education: None    Highest education level: Bachelor's degree (e.g., BA, AB, BS)   Tobacco Use    Smoking status: Never     Passive exposure: Never    Smokeless tobacco: Never   Vaping Use    Vaping status: Never Used   Substance and Sexual Activity    Alcohol use: No    Drug use: No    Sexual activity: Not Currently     Partners: Male     Comment: divorced,2 children,retired,living in [facility]   Social History Narrative    Nothing in the past 5 years     Social Drivers of Psychologist, prison and probation services Strain: Medium Risk (11/17/2023)    Overall Financial Resource Strain (CARDIA)     Difficulty of Paying Living Expenses: Somewhat hard   Food Insecurity: No Food Insecurity (03/07/2024)    Hunger Vital Sign     Worried About Running Out of Food in the Last Year: Never true     Ran Out of Food in the Last Year: Never true   Transportation Needs: No Transportation Needs (03/07/2024)    PRAPARE - Therapist, art (Medical): No     Lack of Transportation (Non-Medical): No   Physical Activity: Insufficiently Active (11/17/2023)    Exercise Vital Sign     Days of Exercise per Week: 2 days     Minutes of Exercise per Session: 30 min   Stress: No Stress Concern Present (05/26/2023)    [employer] of Occupational Health - Occupational Stress Questionnaire     Feeling of Stress : Only  a little   Social Connections: Unknown (05/26/2023)    Social Connection and Isolation Panel [NHANES]     Frequency of Communication with Friends and Family: Once a week     Frequency of Social Gatherings with Friends and Family: Once a week     Attends Religious Services: Never     Database administrator or Organizations: No     Attends Banker Meetings: Never   Recent Concern: Social Connections - Socially Isolated (05/26/2023)    Social Connection and Isolation Panel [NHANES]     Frequency of Communication with Friends and Family: Once a week     Frequency of Social Gatherings with Friends and Family: Once a week     Attends Religious Services: Never     Database administrator or Organizations: No     Attends Banker Meetings: Never     Marital Status: Divorced   Catering manager Violence: Not At Risk (05/26/2023)    Humiliation, Afraid, Rape, and Kick questionnaire     Fear of Current or Ex-Partner: No     Emotionally Abused: No     Physically Abused: No     Sexually Abused: No   Housing Stability: Low Risk  (03/07/2024)    Housing Stability Vital Sign     Unable to Pay for Housing in the Last Year: No     Number of Times Moved in the Last Year: 0  Homeless in the Last Year: No     Family History   Problem Relation Age of Onset    Breast Cancer Maternal Grandmother     Cancer Father         lung ca    Hypertension Father     Diabetes Mother     Hypertension Mother        OBJECTIVE:    BP 112/63 (BP Site: Left Upper Arm, Patient Position: Sitting, BP Cuff Size: Large Adult)   Pulse 65   Temp 98.3 F (36.8 C) (Oral)   Resp 16   Ht 1.626 m (5\' 4" )   Wt 97.2 kg (214 lb 3.2 oz)   SpO2 97%   BMI 36.77 kg/m   CONSTITUTIONAL: well , well nourished, appears age appropriate  EYES: perrla, eom intact  ENMT:moist mucous membranes, pharynx clear  NECK: supple. Thyroid normal  RESPIRATORY: Chest: clear to ascultation and percussion   CARDIOVASCULAR: Heart: regular rate and  rhythm  GASTROINTESTINAL: Abdomen: soft, bowel sounds active  HEMATOLOGIC: no pathological lymph nodes palpated  MUSCULOSKELETAL: Extremities: no edema, pulse 1+   INTEGUMENT: No unusual rashes or suspicious skin lesions noted. Nails appear normal.  NEUROLOGIC: non-focal exam   MENTAL STATUS: alert and oriented, appropriate affect      ASSESSMENT:  1. Severe asthma with acute exacerbation, unspecified whether persistent  -     Nebulizer Administration Set  -     beclomethasone (QVAR REDIHALER) 40 MCG/ACT AERB inhaler; Inhale 1 puff into the lungs in the morning and 1 puff in the evening., Disp-18 g, R-11Normal  -     albuterol-ipratropium (COMBIVENT RESPIMAT) 20-100 MCG/ACT AERS inhaler; Inhale 1 puff into the lungs every 6 hours, Disp-18 g, R-11Normal  2. Ulcer of left lower extremity with muscle involvement without evidence of necrosis (HCC)  -     pregabalin (LYRICA) 300 MG capsule; Take 1 capsule by mouth 2 times daily for 364 days. DAW BRAND NAME ONLY Max Daily Amount: 600 mg, Disp-60 capsule, R-4Normal  3. Type 2 diabetes mellitus with diabetic neuropathy, without long-term current use of insulin (HCC)  -     Hemoglobin A1C; Future  4. 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 Delusion [facility]  Assessment & Plan:   Monitored by specialist- no acute findings meriting change in the plan  7. Acute pulmonary edema (HCC)  Assessment & Plan:   Monitored by specialist- no acute findings meriting change in the plan  8. Chronic heart failure with preserved ejection fraction (HCC)  Assessment & Plan:   Monitored by specialist- no acute findings meriting change in the plan  9. Dementia without behavioral disturbance (HCC)  Assessment & Plan:   Monitored by specialist- no acute findings  meriting change in the plan  10. Stage 3a chronic kidney disease (HCC)  Assessment & Plan:   Monitored by specialist- no acute findings meriting change in the plan       Assessment & Plan  1. Diabetes mellitus.  She requires Libre 2 sensors for continuous glucose monitoring. A prescription for Libre 2 sensors will be issued once she provides the necessary information regarding her pharmacy.  Patient remains on a basal bolus preparation is actually doing reasonably well.  She obviously has a  higher incidence of hypoglycemia which necessitates the use of a continuous glucose monitor.  In reality I do not follow her for diabetes because she vacillates between my office and [facility]    2. Diabetic neuropathy.  She is currently taking pregabalin (Lyrica) 300 mg twice a day for diabetic neuropathy in her legs. She reports that her legs are very painful to walk. A prescription for Lyrica 300 mg twice a day has been sent to [facility].    3.  Asthma  This is reasonably stable and she continues use of her inhalers both a rescue and a maintenance.    4.  Leg ulcers  This is followed by wound care with dressings at least twice a week by nurses.    5.  Severe obesity  I encouraged the patient to lose weight which can be accomplished by eating meals, eliminating snacks, and avoiding the consumption of processed carbohydrates.    Return in about 3 months (around 06/07/2024).      Arbutus Ped, MD

## 2024-03-31 ENCOUNTER — Inpatient Hospital Stay: Payer: MEDICARE | Attending: Radiation Oncology | Primary: Internal Medicine

## 2024-04-20 ENCOUNTER — Encounter

## 2024-04-20 NOTE — Telephone Encounter (Signed)
 Pmp okay to fill states she lost rx

## 2024-04-20 NOTE — Telephone Encounter (Signed)
 RC to patient after receiving VM. Patient reports increased discomfort. Called to discuss. UTR, VM left to [facility] to clinic.

## 2024-04-21 MED ORDER — PREGABALIN 300 MG PO CAPS
300 | ORAL_CAPSULE | Freq: Two times a day (BID) | ORAL | 4 refills | 30.00000 days | Status: DC
Start: 2024-04-21 — End: 2024-10-20
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
## 2024-05-13 MED ORDER — SITAGLIPTIN PHOSPHATE 100 MG PO TABS
100 | ORAL_TABLET | Freq: Every day | ORAL | 3 refills | 30.00000 days | Status: DC
Start: 2024-05-13 — End: 2024-12-20

## 2024-06-07 ENCOUNTER — Ambulatory Visit: Admit: 2024-06-07 | Discharge: 2024-06-07 | Payer: MEDICARE | Attending: Internal Medicine | Primary: Internal Medicine

## 2024-06-07 DIAGNOSIS — E119 Type 2 diabetes mellitus without complications: Secondary | ICD-10-CM

## 2024-06-07 NOTE — Progress Notes (Signed)
 Chief Complaint   Patient presents with    Diabetes     Patient states she is present for a follow up. Patient has been having a lot of pain in her legs unable to sleep.    Have you been to the ER, urgent care clinic since your last visit?  Hospitalized since your last visit?   NO    Have you seen or consulted any other health care providers outside our system since your last visit?   NO

## 2024-06-08 LAB — BASIC METABOLIC PANEL
Anion Gap: 5 mmol/L (ref 2–12)
BUN/Creatinine Ratio: 21 — ABNORMAL HIGH (ref 12–20)
BUN: 17 mg/dL (ref 6–20)
CO2: 31 mmol/L (ref 21–32)
Calcium: 9.6 mg/dL (ref 8.5–10.1)
Chloride: 101 mmol/L (ref 97–108)
Creatinine: 0.81 mg/dL (ref 0.55–1.02)
Est, Glom Filt Rate: 73 mL/min/{1.73_m2} (ref >60)
Glucose: 144 mg/dL — ABNORMAL HIGH (ref 65–100)
Potassium: 4.5 mmol/L (ref 3.5–5.1)
Sodium: 137 mmol/L (ref 136–145)

## 2024-06-08 LAB — HEMOGLOBIN A1C
Estimated Avg Glucose: 166 mg/dL
Hemoglobin A1C: 7.4 % — ABNORMAL HIGH (ref 4.0–5.6)

## 2024-06-16 NOTE — Telephone Encounter (Signed)
 Wound on leg, Bonner suggest the keflex.

## 2024-06-26 MED ORDER — FREESTYLE LIBRE 3 PLUS SENSOR MISC
11 refills | 30.00000 days | Status: DC
Start: 2024-06-26 — End: 2024-07-04

## 2024-06-26 MED ORDER — FREESTYLE LIBRE 3 READER DEVI
0 refills | Status: AC
Start: 2024-06-26 — End: ?

## 2024-06-26 NOTE — Progress Notes (Signed)
 [facility]  [address]  [address]  [place] [address]  Phone:  [phone]  Fax: [phone]       Chief Complaint   Patient presents with    Diabetes   .      SUBJECTIVE:      History of Present Illness  The patient presents for evaluation of leg pain and diabetes.    She reports experiencing leg pain that disrupts her sleep, particularly over the past weekend when she was unable to sleep on Saturday night and Sunday night. She has been managing her own wound care, changing the dressings last night and this morning. She receives assistance from a nurse three times a week due to insurance limitations. She has discontinued her visits to the wound clinic. The wound exhibits signs of infection, including a foul odor and scar tissue. She has been using baby shampoo for wound hygiene and an unspecified solution recommended by the nurse for skin health. However, she has been unable to apply the dressing due to a burning sensation.    She is currently unable to monitor her blood glucose levels at home due to a lack of necessary equipment. She is not currently on insulin  therapy and is uncertain about the appropriate dosage. She is aware of the risks associated with both high and low blood glucose levels. Her current medication regimen includes Januvia . She previously used a sliding scale for insulin  administration, taking Lantus  when her blood glucose level exceeded 150, at which point she would administer 4 units of insulin . Her dosage was later adjusted to 8 units daily, which she divided into two doses of 4 units each, taken in the morning and evening respectively. This regimen was effective for her. She has a Libre 2 device but lacks the sensors.         Current Outpatient Medications   Medication Sig Dispense Refill    Continuous Glucose Sensor (FREESTYLE LIBRE 3 PLUS SENSOR) MISC Blood sugar checks before meals and at bedtime and as needed 2 each 11    Continuous Glucose  Receiver (FREESTYLE LIBRE 3 READER) DEVI Blood sugar checks before meals and at bedtime and as needed 1 each 0    SITagliptin  (JANUVIA ) 100 MG tablet Take 1 tablet by mouth daily 90 tablet 3    pregabalin  (LYRICA ) 300 MG capsule Take 1 capsule by mouth 2 times daily for 364 days. DAW BRAND NAME ONLY Max Daily Amount: 600 mg 60 capsule 4    beclomethasone (QVAR REDIHALER) 40 MCG/ACT AERB inhaler Inhale 1 puff into the lungs in the morning and 1 puff in the evening. 18 g 11    albuterol -ipratropium (COMBIVENT RESPIMAT) 20-100 MCG/ACT AERS inhaler Inhale 1 puff into the lungs every 6 hours 18 g 11    hydrocortisone  2.5 % cream Apply topically with each change of dressing 28 g 5    aspirin  81 MG chewable tablet Take 1 tablet by mouth daily 30 tablet 11    Continuous Glucose Sensor (FREESTYLE LIBRE 2 SENSOR) MISC Blood sugar checks before meals and at bedtime and as needed 2 each 11    REPATHA  SURECLICK 140 MG/ML SOAJ INJECT AS DIRECTED EVERY 2 WEEKS 2 mL 11    omeprazole  (PRILOSEC) 40 MG delayed release capsule Take 1 capsule by mouth every morning (before breakfast) 30 capsule 0    torsemide  (DEMADEX ) 20 MG tablet Take 5 tablets by mouth daily      linaclotide  (LINZESS )  290 MCG CAPS capsule Take 1 capsule by mouth every morning (before breakfast) 30 capsule     amLODIPine  (NORVASC ) 5 MG tablet Take 1 tablet by mouth daily  0    insulin  glargine (LANTUS ) 100 UNIT/ML injection vial Inject 14 Units into the skin nightly 10 mL 3    insulin  lispro (HUMALOG ) 100 UNIT/ML SOLN injection vial Inject 0-4 Units into the skin 4 times daily (before meals and nightly)      budesonide  (PULMICORT ) 0.25 MG/2ML nebulizer suspension Take 2 mLs by nebulization 2 times daily 60 each 3    albuterol  sulfate HFA (VENTOLIN  HFA) 108 (90 Base) MCG/ACT inhaler Inhale 2 puffs into the lungs 4 times daily as needed for Wheezing 18 g 0    carvedilol  (COREG ) 12.5 MG tablet Take 1 tablet by mouth 2 times daily      quinapril  (ACCUPRIL ) 40 MG tablet  quinapril  40 mg tablet   TAKE 1 TABLET BY MOUTH EVERY NIGHT FOR HIGH BLOOD PRESSURE 90 tablet 3    ipratropium (ATROVENT HFA) 17 MCG/ACT inhaler       hydrocortisone  2.5 % cream Apply topically to affected area with each wound dressing change (Patient not taking: Reported on 06/07/2024) 20 g 1    lisinopril  (PRINIVIL ;ZESTRIL ) 40 MG tablet Take 1 tablet by mouth daily (Patient not taking: Reported on 11/19/2023)      PARoxetine  (PAXIL ) 10 MG tablet Take 1 tablet by mouth daily (Patient not taking: Reported on 06/18/2023)  0    linezolid  (ZYVOX ) 600 MG tablet Take 1 tablet by mouth 2 times daily Bid FOR 14 DAYS (Patient not taking: Reported on 03/05/2023)      arformoterol  tartrate 15 MCG/2ML NEBU 15 mcg, budesonide  0.25 MG/2ML SUSP 250 mcg Take 1 Dose by nebulization in the morning and 1 Dose in the evening. (Patient not taking: Reported on 12/18/2022) 60 Units 11    albuterol  sulfate HFA (VENTOLIN  HFA) 108 (90 Base) MCG/ACT inhaler Inhale 2 puffs into the lungs 4 times daily as needed for Wheezing (Patient not taking: Reported on 11/17/2023) 54 g 1    albuterol  (PROVENTIL ) (2.5 MG/3ML) 0.083% nebulizer solution Take 3 mLs by nebulization 4 times daily (Patient not taking: Reported on 04/20/2023) 120 each 3    QUEtiapine  (SEROQUEL ) 25 MG tablet Take 0.5 tablets by mouth nightly (Patient not taking: Reported on 09/14/2023) 60 tablet 3    arformoterol  tartrate (BROVANA ) 15 MCG/2ML NEBU Take 1 ampule by nebulization 2 times daily (Patient not taking: Reported on 12/18/2022) 120 mL 3    olopatadine (PATANOL) 0.1 % ophthalmic solution Apply 2 drops to eye 2 times daily (Patient not taking: Reported on 04/20/2023)       No current facility-administered medications for this visit.     Past Medical History:   Diagnosis Date    Arthritis     Asthma     Chronic renal disease, stage III [facility]) [[id number]] 10/13/2022    Depression     Diabetes (HCC)     Fibromyalgia     Fibromyalgia     Gastroparesis     Hyperlipemia     Hypertension      Neuropathy     Psychotic disorder (HCC)     Stroke [facility])      Past Surgical History:   Procedure Laterality Date    COLONOSCOPY      HYSTERECTOMY (CERVIX STATUS UNKNOWN)       Allergies   Allergen Reactions    Iodinated Contrast Media Shortness  Of Breath    Statins Myalgia     Lovastatin, pravastatin, simvastatin, atorvastatin, zetia, livalo, lovaza    Adhesive Tape Rash    Ampicillin Rash    Gabapentin Nausea And Vomiting    Lidocaine  Rash         REVIEW OF SYSTEMS:  General: negative for - chills or fever  ENT: negative for - headaches, nasal congestion or tinnitus  Respiratory: negative for - cough, hemoptysis, shortness of breath or wheezing  Cardiovascular : negative for - chest pain, edema, palpitations or shortness of breath  Gastrointestinal: negative for - abdominal pain, blood in stools, heartburn or nausea/vomiting  Genito-Urinary: no dysuria, trouble voiding, or hematuria  Musculoskeletal: negative for - gait disturbance, joint pain, joint stiffness or joint swelling  Neurological: no TIA or stroke symptoms  Hematologic: no bruises, no bleeding, no swollen glands  Integument: no lumps, mole changes, nail changes or rash  Endocrine: no malaise/lethargy or unexpected weight changes      Social History     Socioeconomic History    Marital status: Divorced     Spouse name: None    Number of children: 2    Years of education: None    Highest education level: Bachelor's degree (e.g., BA, AB, BS)   Tobacco Use    Smoking status: Never     Passive exposure: Never    Smokeless tobacco: Never   Vaping Use    Vaping status: Never Used   Substance and Sexual Activity    Alcohol use: No    Drug use: No    Sexual activity: Not Currently     Partners: Male     Comment: divorced,2 children,retired,living in [facility]   Social History Narrative    Nothing in the past 5 years     Social Drivers of Psychologist, prison and probation services Strain: Medium Risk (11/17/2023)    Overall Financial Resource Strain (CARDIA)      Difficulty of Paying Living Expenses: Somewhat hard   Food Insecurity: No Food Insecurity (03/07/2024)    Hunger Vital Sign     Worried About Running Out of Food in the Last Year: Never true     Ran Out of Food in the Last Year: Never true   Transportation Needs: No Transportation Needs (03/07/2024)    PRAPARE - Therapist, art (Medical): No     Lack of Transportation (Non-Medical): No   Physical Activity: Insufficiently Active (11/17/2023)    Exercise Vital Sign     Days of Exercise per Week: 2 days     Minutes of Exercise per Session: 30 min   Stress: No Stress Concern Present (05/26/2023)    [employer] of Occupational Health - Occupational Stress Questionnaire     Feeling of Stress : Only a little   Social Connections: Unknown (05/26/2023)    Social Connection and Isolation Panel [NHANES]     Frequency of Communication with Friends and Family: Once a week     Frequency of Social Gatherings with Friends and Family: Once a week     Attends Religious Services: Never     Database administrator or Organizations: No     Attends Banker Meetings: Never   Recent Concern: Social Connections - Socially Isolated (05/26/2023)    Social Connection and Isolation Panel [NHANES]     Frequency of Communication with Friends and Family: Once a week     Frequency of  Social Gatherings with Friends and Family: Once a week     Attends Religious Services: Never     Database administrator or Organizations: No     Attends Banker Meetings: Never     Marital Status: Divorced   Catering manager Violence: Not At Risk (05/26/2023)    Humiliation, Afraid, Rape, and Kick questionnaire     Fear of Current or Ex-Partner: No     Emotionally Abused: No     Physically Abused: No     Sexually Abused: No   Housing Stability: Low Risk  (03/07/2024)    Housing Stability Vital Sign     Unable to Pay for Housing in the Last Year: No     Number of Times Moved in the Last Year: 0     Homeless in the  Last Year: No     Family History   Problem Relation Age of Onset    Breast Cancer Maternal Grandmother     Cancer Father         lung ca    Hypertension Father     Diabetes Mother     Hypertension Mother        OBJECTIVE:    BP 114/67 (BP Site: Left Upper Arm, Patient Position: Sitting, BP Cuff Size: Large Adult)   Pulse 73   Temp 98.7 F (37.1 C) (Oral)   Resp 16   Ht 1.626 m (5' 4)   Wt 97.4 kg (214 lb 12.8 oz)   SpO2 99%   BMI 36.87 kg/m   CONSTITUTIONAL: well , well nourished, appears age appropriate  EYES: perrla, eom intact  ENMT:moist mucous membranes, pharynx clear  NECK: supple. Thyroid normal  RESPIRATORY: Chest: clear to ascultation and percussion   CARDIOVASCULAR: Heart: regular rate and rhythm  GASTROINTESTINAL: Abdomen: soft, bowel sounds active  HEMATOLOGIC: no pathological lymph nodes palpated  MUSCULOSKELETAL: Extremities: Moderate edema distally bilaterally, pulse 1+   INTEGUMENT: No unusual rashes or suspicious skin lesions noted. Nails appear normal.  NEUROLOGIC: non-focal exam   MENTAL STATUS: alert and oriented, appropriate affect      ASSESSMENT:  1. Type 2 diabetes mellitus without complication, without long-term current use of insulin  (HCC)  -     Hemoglobin A1C; Future  -     Continuous Glucose Sensor (FREESTYLE LIBRE 3 PLUS SENSOR) MISC; Blood sugar checks before meals and at bedtime and as needed, Disp-2 each, R-11Normal  -     Continuous Glucose Receiver (FREESTYLE LIBRE 3 READER) DEVI; Blood sugar checks before meals and at bedtime and as needed, Disp-1 each, R-0Normal  2. Dyslipidemia  3. Essential (primary) hypertension  -     Basic Metabolic Panel; Future  4. Cerebrovascular accident (CVA), unspecified mechanism (HCC)  5. Ulcer of left lower extremity with muscle involvement without evidence of necrosis (HCC)       Assessment & Plan  1. Leg pain.  - Increased pain and limited mobility.  - Presence of eschar and scar tissue.  - Regular dressing changes advised to prevent  infection.  - Wound culture to be obtained during the nurse's next visit.    2. Diabetes mellitus.  - Uncontrolled diabetes potentially impeding wound healing.  - No current insulin  usage and lacks supplies to monitor blood sugar.  - Provision of a glucometer for home use.  - Hemoglobin A1c levels to be checked.    3.  Hypertension  - Patient remains on her antihypertensive medication and she is reasonably stable.  4.  Dyslipidemia  - She increased cardiovascular risk and therefore remains on her statin.  In reality she is in a secondary risk prevention mode in view of her past history of a CVA.    5.  Severe obesity  - Patient needs to lose weight which can be accomplished writing meals, eliminating snacking, and avoiding the consumption of processed carbohydrates.    Return in about 3 months (around 09/07/2024).      Willma Factor, MD

## 2024-07-04 ENCOUNTER — Encounter

## 2024-07-06 MED ORDER — FREESTYLE LIBRE 3 PLUS SENSOR MISC
11 refills | Status: AC
Start: 2024-07-06 — End: ?

## 2024-08-02 ENCOUNTER — Encounter

## 2024-08-03 MED ORDER — OMEPRAZOLE 40 MG PO CPDR
40 | ORAL_CAPSULE | Freq: Every day | ORAL | 11 refills | 90.00000 days | Status: AC
Start: 2024-08-03 — End: ?

## 2024-08-17 ENCOUNTER — Emergency Department: Admit: 2024-08-17 | Payer: MEDICARE

## 2024-08-17 ENCOUNTER — Inpatient Hospital Stay: Admit: 2024-08-17 | Payer: MEDICARE

## 2024-08-17 ENCOUNTER — Inpatient Hospital Stay
Admit: 2024-08-17 | Discharge: 2024-08-23 | Disposition: A | Discharge status: Home / Self Care | Payer: MEDICARE | Attending: Hospitalist | Admitting: Hospitalist

## 2024-08-17 DIAGNOSIS — A419 Sepsis, unspecified organism: Principal | ICD-10-CM

## 2024-08-17 DIAGNOSIS — L03115 Cellulitis of right lower limb: Principal | ICD-10-CM

## 2024-08-17 LAB — CBC WITH AUTO DIFFERENTIAL
Basophils %: 0.3 % (ref 0.0–1.0)
Basophils Absolute: 0.05 K/UL (ref 0.00–0.10)
Eosinophils %: 0.1 % (ref 0.0–7.0)
Eosinophils Absolute: 0.01 K/UL (ref 0.00–0.40)
Hematocrit: 27.9 % — ABNORMAL LOW (ref 35.0–47.0)
Hemoglobin: 8.5 g/dL — ABNORMAL LOW (ref 11.5–16.0)
Immature Granulocytes %: 0.5 % (ref 0.0–0.5)
Immature Granulocytes Absolute: 0.07 K/UL — ABNORMAL HIGH (ref 0.00–0.04)
Lymphocytes %: 7.8 % — ABNORMAL LOW (ref 12.0–49.0)
Lymphocytes Absolute: 1.16 K/UL (ref 0.80–3.50)
MCH: 23.2 pg — ABNORMAL LOW (ref 26.0–34.0)
MCHC: 30.5 g/dL (ref 30.0–36.5)
MCV: 76 FL — ABNORMAL LOW (ref 80.0–99.0)
MPV: 12.5 FL (ref 8.9–12.9)
Monocytes %: 5.5 % (ref 5.0–13.0)
Monocytes Absolute: 0.82 K/UL (ref 0.00–1.00)
Neutrophils %: 85.8 % — ABNORMAL HIGH (ref 32.0–75.0)
Neutrophils Absolute: 12.68 K/UL — ABNORMAL HIGH (ref 1.80–8.00)
Nucleated RBCs: 0 /100{WBCs}
Platelets: 368 K/uL (ref 150–400)
RBC: 3.67 M/uL — ABNORMAL LOW (ref 3.80–5.20)
RDW: 18.7 % — ABNORMAL HIGH (ref 11.5–14.5)
WBC: 14.8 K/uL — ABNORMAL HIGH (ref 3.6–11.0)
nRBC: 0 K/uL (ref 0.00–0.01)

## 2024-08-17 LAB — URINALYSIS WITH MICROSCOPIC
Bilirubin, Urine: NEGATIVE
Blood, Urine: NEGATIVE
Glucose, Ur: NEGATIVE mg/dL
Ketones, Urine: NEGATIVE mg/dL
Nitrite, Urine: NEGATIVE
Specific Gravity, UA: 1.017 (ref 1.003–1.030)
Urobilinogen, Urine: 0.2 EU/dL (ref 0.2–1.0)
pH, Urine: 8.5 — ABNORMAL HIGH (ref 5.0–8.0)

## 2024-08-17 LAB — EKG 12-LEAD
Atrial Rate: 95 {beats}/min
Diagnosis: NORMAL
P Axis: 59 degrees
P-R Interval: 142 ms
Q-T Interval: 334 ms
QRS Duration: 78 ms
QTc Calculation (Bazett): 419 ms
R Axis: 23 degrees
T Axis: 41 degrees
Ventricular Rate: 95 {beats}/min

## 2024-08-17 LAB — COMPREHENSIVE METABOLIC PANEL
ALT: 13 U/L (ref 10–35)
AST: 28 U/L (ref 10–35)
Albumin/Globulin Ratio: 0.7 — ABNORMAL LOW (ref 1.1–2.2)
Albumin: 2.8 g/dL — ABNORMAL LOW (ref 3.5–5.2)
Alk Phosphatase: 111 U/L — ABNORMAL HIGH (ref 35–104)
Anion Gap: 11 mmol/L (ref 2–14)
BUN: 30 mg/dL — ABNORMAL HIGH (ref 8–23)
CO2: 30 mmol/L — ABNORMAL HIGH (ref 20–29)
Calcium: 9.3 mg/dL (ref 8.8–10.2)
Chloride: 99 mmol/L (ref 98–107)
Creatinine: 1.04 mg/dL — ABNORMAL HIGH (ref 0.60–1.00)
Est, Glom Filt Rate: 54 ml/min/1.73m2 — ABNORMAL LOW (ref >90)
Globulin: 4 g/dL (ref 2.0–4.0)
Glucose: 196 mg/dL — ABNORMAL HIGH (ref 65–100)
Potassium: 4.1 mmol/L (ref 3.5–5.1)
Sodium: 140 mmol/L (ref 136–145)
Total Bilirubin: 0.4 mg/dL (ref 0.0–1.2)
Total Protein: 6.8 g/dL (ref 6.4–8.3)

## 2024-08-17 LAB — URINE CULTURE HOLD SAMPLE

## 2024-08-17 LAB — EXTRA TUBES HOLD

## 2024-08-17 LAB — CK: Total CK: 898 U/L — ABNORMAL HIGH (ref 26–192)

## 2024-08-17 MED ORDER — MAGNESIUM SULFATE 2000 MG/50 ML IVPB PREMIX
2 | INTRAVENOUS | Status: DC | PRN
Start: 2024-08-17 — End: 2024-08-23

## 2024-08-17 MED ORDER — ACETAMINOPHEN 325 MG PO TABS
325 | ORAL | Status: AC
Start: 2024-08-17 — End: 2024-08-17
  Administered 2024-08-17: 12:00:00 650 mg via ORAL

## 2024-08-17 MED ORDER — CARVEDILOL 12.5 MG PO TABS
12.5 | Freq: Two times a day (BID) | ORAL | Status: DC
Start: 2024-08-17 — End: 2024-08-17

## 2024-08-17 MED ORDER — POTASSIUM BICARB-CITRIC ACID 20 MEQ PO TBEF
20 | ORAL | Status: AC | PRN
Start: 2024-08-17 — End: 2024-08-18

## 2024-08-17 MED ORDER — GLUCOSE 4 G PO CHEW
4 | ORAL | Status: DC | PRN
Start: 2024-08-17 — End: 2024-08-23

## 2024-08-17 MED ORDER — NORMAL SALINE FLUSH 0.9 % IV SOLN
0.9 | Freq: Two times a day (BID) | INTRAVENOUS | Status: DC
Start: 2024-08-17 — End: 2024-08-23
  Administered 2024-08-18 – 2024-08-23 (×11): 10 mL via INTRAVENOUS

## 2024-08-17 MED ORDER — SODIUM CHLORIDE 0.9 % IV SOLN
0.9 | INTRAVENOUS | Status: DC | PRN
Start: 2024-08-17 — End: 2024-08-23
  Administered 2024-08-23: 11:00:00 via INTRAVENOUS

## 2024-08-17 MED ORDER — NORMAL SALINE FLUSH 0.9 % IV SOLN
0.9 | INTRAVENOUS | Status: DC | PRN
Start: 2024-08-17 — End: 2024-08-23
  Administered 2024-08-18: 18:00:00 10 mL via INTRAVENOUS

## 2024-08-17 MED ORDER — DEXTROSE 10 % IV SOLN
10 | INTRAVENOUS | Status: DC | PRN
Start: 2024-08-17 — End: 2024-08-23

## 2024-08-17 MED ORDER — ONDANSETRON 4 MG PO TBDP
4 | Freq: Three times a day (TID) | ORAL | Status: DC | PRN
Start: 2024-08-17 — End: 2024-08-23

## 2024-08-17 MED ORDER — STERILE WATER FOR INJECTION (MIXTURES ONLY)
2 | Freq: Once | INTRAVENOUS | Status: AC
Start: 2024-08-17 — End: 2024-08-17
  Administered 2024-08-17: 21:00:00 2000 mg via INTRAVENOUS

## 2024-08-17 MED ORDER — AMLODIPINE BESYLATE 5 MG PO TABS
5 | Freq: Every day | ORAL | Status: DC
Start: 2024-08-17 — End: 2024-08-23
  Administered 2024-08-18 – 2024-08-23 (×6): 5 mg via ORAL

## 2024-08-17 MED ORDER — ACETAMINOPHEN 325 MG PO TABS
325 | Freq: Four times a day (QID) | ORAL | Status: DC | PRN
Start: 2024-08-17 — End: 2024-08-23
  Administered 2024-08-18 – 2024-08-22 (×5): 650 mg via ORAL

## 2024-08-17 MED ORDER — POTASSIUM CHLORIDE ER 10 MEQ PO TBCR
10 | ORAL | Status: AC | PRN
Start: 2024-08-17 — End: 2024-08-18

## 2024-08-17 MED ORDER — AMLODIPINE BESYLATE 5 MG PO TABS
5 | Freq: Every day | ORAL | Status: DC
Start: 2024-08-17 — End: 2024-08-17

## 2024-08-17 MED ORDER — CARVEDILOL 12.5 MG PO TABS
12.5 | Freq: Two times a day (BID) | ORAL | Status: DC
Start: 2024-08-17 — End: 2024-08-23
  Administered 2024-08-18 – 2024-08-23 (×9): 12.5 mg via ORAL

## 2024-08-17 MED ORDER — INSULIN LISPRO 100 UNIT/ML IJ SOLN
100 | Freq: Four times a day (QID) | INTRAMUSCULAR | Status: DC
Start: 2024-08-17 — End: 2024-08-23
  Administered 2024-08-18 – 2024-08-22 (×5): 1 [IU] via SUBCUTANEOUS

## 2024-08-17 MED ORDER — CEFEPIME HCL 2 G IV SOLR
2 | Freq: Two times a day (BID) | INTRAVENOUS | Status: DC
Start: 2024-08-17 — End: 2024-08-17

## 2024-08-17 MED ORDER — PANTOPRAZOLE SODIUM 40 MG PO TBEC
40 | Freq: Every day | ORAL | Status: DC
Start: 2024-08-17 — End: 2024-08-23
  Administered 2024-08-18 – 2024-08-23 (×6): 40 mg via ORAL

## 2024-08-17 MED ORDER — POTASSIUM CHLORIDE 10 MEQ/100ML IV SOLN
10 | INTRAVENOUS | Status: AC | PRN
Start: 2024-08-17 — End: 2024-08-18

## 2024-08-17 MED ORDER — VANCOMYCIN INTERMITTENT DOSING (PLACEHOLDER)
INTRAVENOUS | Status: DC
Start: 2024-08-17 — End: 2024-08-20

## 2024-08-17 MED ORDER — BUDESONIDE 0.25 MG/2ML IN SUSP
0.25 | Freq: Two times a day (BID) | RESPIRATORY_TRACT | Status: DC
Start: 2024-08-17 — End: 2024-08-23
  Administered 2024-08-17 – 2024-08-23 (×12): 250 mg via RESPIRATORY_TRACT

## 2024-08-17 MED ORDER — DEXTROSE 10 % IV BOLUS
INTRAVENOUS | Status: DC | PRN
Start: 2024-08-17 — End: 2024-08-23

## 2024-08-17 MED ORDER — VANCOMYCIN HCL 1 G IV SOLR
1 | INTRAVENOUS | Status: DC
Start: 2024-08-17 — End: 2024-08-19
  Administered 2024-08-18: 18:00:00 1000 mg via INTRAVENOUS

## 2024-08-17 MED ORDER — GLUCAGON (RDNA) 1 MG IJ KIT
1 | INTRAMUSCULAR | Status: DC | PRN
Start: 2024-08-17 — End: 2024-08-23

## 2024-08-17 MED ORDER — PREGABALIN 75 MG PO CAPS
75 | Freq: Two times a day (BID) | ORAL | Status: DC
Start: 2024-08-17 — End: 2024-08-23
  Administered 2024-08-18 – 2024-08-23 (×12): 150 mg via ORAL

## 2024-08-17 MED ORDER — CEFEPIME HCL 2 G IV SOLR
2 | INTRAVENOUS | Status: DC
Start: 2024-08-17 — End: 2024-08-18
  Administered 2024-08-18: 20:00:00 2000 mg via INTRAVENOUS

## 2024-08-17 MED ORDER — ONDANSETRON HCL 4 MG/2ML IJ SOLN
4 | Freq: Four times a day (QID) | INTRAMUSCULAR | Status: DC | PRN
Start: 2024-08-17 — End: 2024-08-23

## 2024-08-17 MED ORDER — METHOCARBAMOL 500 MG PO TABS
500 | ORAL | Status: AC
Start: 2024-08-17 — End: 2024-08-17
  Administered 2024-08-17: 12:00:00 1000 mg via ORAL

## 2024-08-17 MED ORDER — ACETAMINOPHEN 650 MG RE SUPP
650 | Freq: Four times a day (QID) | RECTAL | Status: DC | PRN
Start: 2024-08-17 — End: 2024-08-23

## 2024-08-17 MED ORDER — VANCOMYCIN 2500 MG IN NS 500 ML (PREMIX) IVPB
Freq: Once | Status: AC
Start: 2024-08-17 — End: 2024-08-17
  Administered 2024-08-17: 17:00:00 2500 mg/kg via INTRAVENOUS

## 2024-08-17 MED ORDER — POLYETHYLENE GLYCOL 3350 17 G PO PACK
17 | Freq: Every day | ORAL | Status: DC | PRN
Start: 2024-08-17 — End: 2024-08-19

## 2024-08-17 MED ORDER — ALOGLIPTIN BENZOATE 6.25 MG PO TABS
6.25 | Freq: Every day | ORAL | Status: DC
Start: 2024-08-17 — End: 2024-08-17
  Administered 2024-08-17: 22:00:00 6.25 mg via ORAL

## 2024-08-17 MED FILL — MONOJECT FLUSH SYRINGE 0.9 % IV SOLN: 0.9 % | INTRAVENOUS | Qty: 40

## 2024-08-17 MED FILL — ACETAMINOPHEN 325 MG PO TABS: 325 mg | ORAL | Qty: 2

## 2024-08-17 MED FILL — CEFEPIME HCL 2 G IV SOLR: 2 g | INTRAVENOUS | Qty: 2

## 2024-08-17 MED FILL — ALOGLIPTIN BENZOATE 6.25 MG PO TABS: 6.25 mg | ORAL | Qty: 1

## 2024-08-17 MED FILL — VANCOMYCIN 2500 MG IN NS 500 ML (PREMIX) IVPB: Qty: 500

## 2024-08-17 MED FILL — METHOCARBAMOL 500 MG PO TABS: 500 mg | ORAL | Qty: 2

## 2024-08-17 MED FILL — DEXTROSE 10 % IV SOLN: 10 % | INTRAVENOUS | Qty: 1000

## 2024-08-17 NOTE — Progress Notes (Addendum)
 Clinical Pharmacy Note - Extended Infusion Beta-Lactam Dosing    This patient has been ordered Cefepime  at 2000 mg IV every 12 hours. P&T protocol allows automatic substitution to an extended infusion and to adjust the dose based on indication and renal function.    Indication: SSTI    CrCl: 48 mL/min    BMI: 36.18 kg/m2    Per P&T protocol, the Cefepime  dosing will be adjusted to a loading dose of 2000 g x 1 over 30 minutes, then 2000 mg IV every 24 hours (each dose will be infused over 4 hours) with respect to indication and renal status (CrCl 30-59 mL/min) per MEC/P&T-approved Renal Dosing Adjustment protocol.  Pharmacy will continue to monitor this patient daily for changes in clinical status and renal function.      Please call pharmacy with any questions.    Prentice Bray, PharmD  Clinical Pharmacist  [facility]

## 2024-08-17 NOTE — Care Coordination-Inpatient (Addendum)
 Care Management Initial Assessment       RUR: NA  Readmission? No  1st IM letter given? No  1st Tricare letter given: No     08/17/24 1352   Service Assessment   Patient Orientation Person;Place   Cognition Dementia / Early Alzheimer's   History Provided By Patient   Primary Caregiver Self   Support Systems Children   Prior Functional Level Independent in ADLs/IADLs   Can patient return to prior living arrangement Unknown at present   Ability to make needs known: Fair   Would you like for me to discuss the discharge plan with any other family members/significant others, and if so, who? Yes  (son Kathlyne Meeker [phone])   Financial Resources Medicare   Social/Functional History   Lives With Alone   Type of Home Apartment   Home Layout One level   Bathroom Shower/Tub Walk-in Microbiologist   Prior Level of Assist for ADLs Independent   Occupation Retired   Building control surveyor   History of falls? 1     CM consult received and appreciated. EMR reviewed. History of CVA. Patient presents to the ED s/p fall in bathroom unable to get up and fell asleep on floor. Patient has life alert button however did not use.     Case discussed w/ Geriatric Team. Noted patient is participant in MSSP program.     Met w/patient and introduced to role. Patient is alert noted delayed responses and speech. Ms. Bajaj has resided at [place] since  2018. Patient states was to have new appointment at [facility] today and has been in communication. This Clinical research associate asked about AMD informed is on file. Noted son Kathlyne and daughter Bernice as Research officer, political party. Ms. Morna states has received PT in the past however was unable to provide name of facility.     Son lives in [place] and daughter is in [place]. Arzella provides assistance getting to [place] appointments and grocery shopping. Ms. Sonnen is pending driving DMV evaluation.     SS/Retirement $2000/month.     [facility] is local  pharmacy    CM Department will follow for TOC needs.     1515 Call placed to [facility] [phone] informed patient in the ED and requesting to communicate w/ Care Team member.  Spoke w/ Dwanda confirmed patient is followed by clinic Welcome Visit 07/25/24 and Testing Appointment (Labs) 08/01/24 patient cancelled today's appointment and next planned visit request is 08/22/24 Ms. Junco would need to call and request time. Informed would like to talk w/ a CM or SW regarding patient and the need to collaborate regarding TOC needs. Spoke w/ Rosina Dings and  message sent to Bazine , SW (outcome pending). Patient is followed by Glade Pang, NP at the clinic.     1630 Call placed to son Kathlyne introduced to [employer] - states he is out of time at this time. Informed of concern of patient living home alone and was on the floor since yesterday. Son states he does not have knowledged of his mother's finances only has Tree surgeon. Provided info Bernice Ellen (daughter) [phone] and [employer]ia [employer] [phone] (sister) . Son states his home is 3 levels and concerned about her falling.     Call placed to daughter - VM left to contact Geriatric CM. Alternative call placed to Ms. White introduced to roles requested to get her other sister on the line [employer] in [place].  Additional history provided family has attempted to clean apartment  (old food stuffs/ fruit flies/ trash) and get [facility] services and patent has refused and become upset w/ family.  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     Sisters asked about rehab to get stronger. Informed currently patient is a 2 person assist for transfer. Discussed of the need for planning will need plan after SNF.     CM Department will follow.

## 2024-08-17 NOTE — ED Provider Notes (Signed)
 ED SIGN OUT NOTE  Care assumed at [facility]. [facility] 7:17 AM EDT    Patient was signed out to me by Dr. Jonette.     Patient is awaiting labs, imaging and reassessment..    BP 137/72   Pulse 98   Temp 99.4 F (37.4 C) (Oral)   Resp 18   Ht 1.626 m (5' 4)   Wt 95.6 kg (210 lb 12.2 oz)   SpO2 (!) 89%   BMI 36.18 kg/m     Labs Reviewed   CBC WITH AUTO DIFFERENTIAL   COMPREHENSIVE METABOLIC PANEL   CK   URINALYSIS WITH MICROSCOPIC   EXTRA TUBES HOLD     XR LUMBAR SPINE (2-3 VIEWS)   Final Result   Scoliosis and multilevel lumbosacral degenerative disc disease without acute   abnormality.                  Electronically signed by DARICE COLON      XR CHEST PORTABLE   Final Result      No acute process on portable chest.         Electronically signed by DARICE COLON          ED Course as of 08/17/24 1119   Wed Aug 17, 2024   0728 EKG shows normal sinus rhythm with a rate of 95 no ST or T wave abnormalities to suggest ischemia or infarct.  Normal intervals, normal axis. [DA]      ED Course User Index  [DA] Lenon Celestine ORN, MD       Diagnosis:   No diagnosis found.    Disposition:        Plan:   Awaiting labs, imaging, reassessment.    11:19 AM    Imaging shows no acute abnormality.  Patient has a mild leukocytosis no evidence of infection on UA I have reassessed the patient she appears to be debilitated unable to care for self therefore we will consult Senior services, PT/OT to further evaluate.    Patient has been seen by Senior services I have reexamined the patient concern for poor healing leg wound as well as leukocytosis concern for cellulitis patient would warrant inpatient admission with wound care and likely PT.  I have discussed plan of care with the patient she is agreeable with admission.  Please see media of leg wound.              Imogene Gravelle W Fremon Zacharia, MD      Perfect Serve Consult for Admission  1:02 PM    ED Room Number: ER23/23  Patient Name and age:  Jamie Bryant 83 y.o.  female  Working  Diagnosis:   1. Cellulitis of right lower extremity    2. Frequent falls        COVID-19 Suspicion: No  Sepsis present:  No  Reassessment needed: No  Code Status:  Full Code  Readmission: No  Isolation Requirements: no  Recommended Level of Care: telemetry  Department: [facility] Adult ED - [phone]  Consulting Provider: Senior services    Other:      Total critical care time spent exclusive of procedures:  37 minutes.           Lenon Celestine ORN, MD  08/17/24 [phone]

## 2024-08-17 NOTE — ED Provider Notes (Signed)
 [facility]  EMERGENCY DEPARTMENT ENCOUNTER      Pt Name: Jamie Bryant  MRN: [id number]  Birthdate [birth year]  Date of evaluation: 08/17/2024  Provider: Reyes Rocks, MD    CHIEF COMPLAINT       Chief Complaint   Patient presents with    Fall         HISTORY OF PRESENT ILLNESS   (Location/Symptom, Timing/Onset, Context/Setting, Quality, Duration, Modifying Factors, Severity)  Note limiting factors.   83 year old female presents from home via EMS for reports of a fall.  States she fell on Tuesday and was unable to get up.  She lives alone did not activate her life alert button.  EMS arrived stating that they found the patient on the floor talking to her son on the phone.  Patient's only complaint is low back pain.  Denies any head injury, chest pain, loss of consciousness.  Does not take blood thinning medications.  Review of EMR shows a history of hypertension, fibromyalgia, stroke, diabetes, depression, neuropathy, gastroparesis, chronic kidney disease.    The history is provided by the patient, the EMS personnel and medical records.         Review of External Medical Records:     Nursing Notes were reviewed.    REVIEW OF SYSTEMS    (2-9 systems for level 4, 10 or more for level 5)     Review of Systems   Constitutional:  Negative for fatigue.   HENT:  Negative for sore throat.    Eyes:  Negative for visual disturbance.   Respiratory:  Negative for cough.    Cardiovascular:  Negative for palpitations.   Gastrointestinal:  Negative for vomiting.   Genitourinary:  Negative for difficulty urinating.   Musculoskeletal:  Negative for myalgias.   Skin:  Negative for rash.   Neurological:  Negative for weakness.       Except as noted above the remainder of the review of systems was reviewed and negative.       PAST MEDICAL HISTORY     Past Medical History:   Diagnosis Date    Arthritis     Asthma     Cerebrovascular accident (CVA) (HCC) 08/06/2022    Chronic renal disease, stage III [facility])  [[id number]] 10/13/2022    Depression     Diabetes (HCC)     Fibromyalgia     Fibromyalgia     Gastroparesis     Hyperlipemia     Hypertension     Neuropathy     Psychotic disorder (HCC)     Stroke (HCC)          SURGICAL HISTORY       Past Surgical History:   Procedure Laterality Date    COLONOSCOPY      HYSTERECTOMY (CERVIX STATUS UNKNOWN)           CURRENT MEDICATIONS       Previous Medications    ALBUTEROL  (PROVENTIL ) (2.5 MG/3ML) 0.083% NEBULIZER SOLUTION    Take 3 mLs by nebulization 4 times daily    ALBUTEROL  SULFATE HFA (VENTOLIN  HFA) 108 (90 BASE) MCG/ACT INHALER    Inhale 2 puffs into the lungs 4 times daily as needed for Wheezing    ALBUTEROL  SULFATE HFA (VENTOLIN  HFA) 108 (90 BASE) MCG/ACT INHALER    Inhale 2 puffs into the lungs 4 times daily as needed for Wheezing    ALBUTEROL -IPRATROPIUM (COMBIVENT RESPIMAT) 20-100 MCG/ACT AERS INHALER    Inhale 1 puff into the lungs  every 6 hours    AMLODIPINE  (NORVASC ) 5 MG TABLET    Take 1 tablet by mouth daily    ARFORMOTEROL  TARTRATE (BROVANA ) 15 MCG/2ML NEBU    Take 1 ampule by nebulization 2 times daily    ARFORMOTEROL  TARTRATE 15 MCG/2ML NEBU 15 MCG, BUDESONIDE  0.25 MG/2ML SUSP 250 MCG    Take 1 Dose by nebulization in the morning and 1 Dose in the evening.    ASPIRIN  81 MG CHEWABLE TABLET    Take 1 tablet by mouth daily    BECLOMETHASONE (QVAR REDIHALER) 40 MCG/ACT AERB INHALER    Inhale 1 puff into the lungs in the morning and 1 puff in the evening.    BUDESONIDE  (PULMICORT ) 0.25 MG/2ML NEBULIZER SUSPENSION    Take 2 mLs by nebulization 2 times daily    CARVEDILOL  (COREG ) 12.5 MG TABLET    Take 1 tablet by mouth 2 times daily    CONTINUOUS GLUCOSE RECEIVER (FREESTYLE LIBRE 3 READER) DEVI    Blood sugar checks before meals and at bedtime and as needed    CONTINUOUS GLUCOSE SENSOR (FREESTYLE LIBRE 2 SENSOR) MISC    Blood sugar checks before meals and at bedtime and as needed    CONTINUOUS GLUCOSE SENSOR (FREESTYLE LIBRE 3 PLUS SENSOR) MISC    Blood sugar checks  before meals and at bedtime and as needed    HYDROCORTISONE  2.5 % CREAM    Apply topically to affected area with each wound dressing change    HYDROCORTISONE  2.5 % CREAM    Apply topically with each change of dressing    INSULIN  GLARGINE (LANTUS ) 100 UNIT/ML INJECTION VIAL    Inject 14 Units into the skin nightly    INSULIN  LISPRO (HUMALOG ) 100 UNIT/ML SOLN INJECTION VIAL    Inject 0-4 Units into the skin 4 times daily (before meals and nightly)    IPRATROPIUM (ATROVENT HFA) 17 MCG/ACT INHALER        LINACLOTIDE  (LINZESS ) 290 MCG CAPS CAPSULE    Take 1 capsule by mouth every morning (before breakfast)    LINEZOLID  (ZYVOX ) 600 MG TABLET    Take 1 tablet by mouth 2 times daily Bid FOR 14 DAYS    LISINOPRIL  (PRINIVIL ;ZESTRIL ) 40 MG TABLET    Take 1 tablet by mouth daily    OLOPATADINE (PATANOL) 0.1 % OPHTHALMIC SOLUTION    Apply 2 drops to eye 2 times daily    OMEPRAZOLE  (PRILOSEC) 40 MG DELAYED RELEASE CAPSULE    Take 1 capsule by mouth every morning (before breakfast)    PAROXETINE  (PAXIL ) 10 MG TABLET    Take 1 tablet by mouth daily    PREGABALIN  (LYRICA ) 300 MG CAPSULE    Take 1 capsule by mouth 2 times daily for 364 days. DAW BRAND NAME ONLY Max Daily Amount: 600 mg    QUETIAPINE  (SEROQUEL ) 25 MG TABLET    Take 0.5 tablets by mouth nightly    QUINAPRIL  (ACCUPRIL ) 40 MG TABLET    quinapril  40 mg tablet   TAKE 1 TABLET BY MOUTH EVERY NIGHT FOR HIGH BLOOD PRESSURE    REPATHA  SURECLICK 140 MG/ML SOAJ    INJECT AS DIRECTED EVERY 2 WEEKS    SITAGLIPTIN  (JANUVIA ) 100 MG TABLET    Take 1 tablet by mouth daily    TORSEMIDE  (DEMADEX ) 20 MG TABLET    Take 5 tablets by mouth daily       ALLERGIES     Iodinated contrast media, Statins, Adhesive tape, Ampicillin, Gabapentin, and Lidocaine   FAMILY HISTORY       Family History   Problem Relation Age of Onset    Breast Cancer Maternal Grandmother     Cancer Father         lung ca    Hypertension Father     Diabetes Mother     Hypertension Mother           SOCIAL HISTORY        Social History     Socioeconomic History    Marital status: Divorced    Number of children: 2    Highest education level: Bachelor's degree (e.g., BA, AB, BS)   Tobacco Use    Smoking status: Never     Passive exposure: Never    Smokeless tobacco: Never   Vaping Use    Vaping status: Never Used   Substance and Sexual Activity    Alcohol use: No    Drug use: No    Sexual activity: Not Currently     Partners: Male     Comment: divorced,2 children,retired,living in [facility]   Social History Narrative    Nothing in the past 5 years     Social Drivers of Psychologist, prison and probation services Strain: Medium Risk (11/17/2023)    Overall Financial Resource Strain (CARDIA)     Difficulty of Paying Living Expenses: Somewhat hard   Transportation Needs: No Transportation Needs (03/07/2024)    PRAPARE - Transportation     Lack of Transportation (Medical): No     Lack of Transportation (Non-Medical): No   Physical Activity: Insufficiently Active (11/17/2023)    Exercise Vital Sign     Days of Exercise per Week: 2 days     Minutes of Exercise per Session: 30 min   Stress: No Stress Concern Present (05/26/2023)    [employer] of Occupational Health - Occupational Stress Questionnaire     Feeling of Stress : Only a little   Social Connections: Unknown (05/26/2023)    Social Connection and Isolation Panel     Frequency of Communication with Friends and Family: Once a week     Frequency of Social Gatherings with Friends and Family: Once a week     Attends Religious Services: Never     Database administrator or Organizations: No     Attends Banker Meetings: Never   Recent Concern: Social Connections - Socially Isolated (05/26/2023)    Social Connection and Isolation Panel     Frequency of Communication with Friends and Family: Once a week     Frequency of Social Gatherings with Friends and Family: Once a week     Attends Religious Services: Never     Database administrator or Organizations: No      Attends Banker Meetings: Never     Marital Status: Divorced   Catering manager Violence: Not At Risk (05/26/2023)    Humiliation, Afraid, Rape, and Kick questionnaire     Fear of Current or Ex-Partner: No     Emotionally Abused: No     Physically Abused: No     Sexually Abused: No   Housing Stability: Low Risk  (03/07/2024)    Housing Stability Vital Sign     Unable to Pay for Housing in the Last Year: No     Number of Times Moved in the Last Year: 0     Homeless in the Last Year: No  PHYSICAL EXAM    (up to 7 for level 4, 8 or more for level 5)     ED Triage Vitals   BP Girls Systolic BP Percentile Girls Diastolic BP Percentile Boys Systolic BP Percentile Boys Diastolic BP Percentile Temp Temp src Pulse   -- -- -- -- -- -- -- --      Resp SpO2 Height Weight       -- -- -- --           Body mass index is 36.18 kg/m.    Physical Exam  HENT:      Head: Normocephalic.      Mouth/Throat:      Mouth: Mucous membranes are moist.   Eyes:      General: No scleral icterus.  Cardiovascular:      Rate and Rhythm: Normal rate.   Pulmonary:      Effort: Pulmonary effort is normal.   Abdominal:      General: There is no distension.   Musculoskeletal:         General: No deformity.      Cervical back: Neck supple.   Skin:     Findings: No rash.   Neurological:      General: No focal deficit present.      Mental Status: She is alert and oriented to person, place, and time.         DIAGNOSTIC RESULTS     EKG: All EKG's are interpreted by the Emergency Department Physician who either signs or Co-signs this chart in the absence of a cardiologist.        RADIOLOGY:   Non-plain film images such as CT, Ultrasound and MRI are read by the radiologist. Plain radiographic images are visualized and preliminarily interpreted by the emergency physician with the below findings:        Interpretation per the Radiologist below, if available at the time of this note:    XR LUMBAR SPINE (2-3 VIEWS)    (Results Pending)         LABS:  Labs Reviewed   CBC WITH AUTO DIFFERENTIAL   COMPREHENSIVE METABOLIC PANEL   CK   URINALYSIS WITH MICROSCOPIC   EXTRA TUBES HOLD       All other labs were within normal range or not returned as of this dictation.    EMERGENCY DEPARTMENT COURSE and DIFFERENTIAL DIAGNOSIS/MDM:   Vitals:    Vitals:    08/17/24 0623   BP: 137/72   Pulse: 98   Resp: 18   Temp: 99.4 F (37.4 C)   TempSrc: Oral   SpO2: (!) 89%   Weight: 95.6 kg (210 lb 12.2 oz)   Height: 1.626 m (5' 4)           Medical Decision Making  Assessment: Patient presented from home with reports of a fall after trying to the bathroom yesterday.  States she thought she could sleep and feel better but she woke up with pain in her lower back and was unable to get up.  Vital signs stable on arrival except for low O2 sat.  Patient denies any head injury or use of blood thinners.  Plan to obtain EKG, x-ray of the lumbar spine and chest, labs, reassess.    Patient signed out to Dr. Lenon at 7 AM pending results of diagnostics and repeat evaluation.    Amount and/or Complexity of Data Reviewed  Labs: ordered.  Radiology: ordered.  ECG/medicine tests: ordered.    Risk  OTC drugs.            REASSESSMENT            CONSULTS:  None    PROCEDURES:  Unless otherwise noted below, none     Procedures      FINAL IMPRESSION    No diagnosis found.      DISPOSITION/PLAN   DISPOSITION        PATIENT REFERRED TO:  No follow-up provider specified.    DISCHARGE MEDICATIONS:  New Prescriptions    No medications on file         (Please note that portions of this note were completed with a voice recognition program.  Efforts were made to edit the dictations but occasionally words are mis-transcribed.)    Reyes Rocks, MD (electronically signed)  Emergency Attending Physician / Physician Assistant / Nurse Practitioner             Rocks Reyes DASEN, MD  08/17/24 [phone]

## 2024-08-17 NOTE — H&P (Signed)
 History and Physical    Date of Service:  08/17/2024  Primary Care Provider: Graylon Willma LABOR, MD  Source of information: patient, electronic medical record    Chief Complaint: Fall      History of Presenting Illness:   Jamie Bryant is a 83 y.o. female with a pmhx DM II, gastroparesis, CKD III, past stroke, HTN, and dyslipidemia who presents via EMS for fall, and is being admitted for cellulitis.    In the ED, she was tachycardic to 101, and hypoxic to 89% on RA.  Other VSS.  Labs showed WBC 14.8, microcytic anemia with Hgb 8.5 (b/l 8-9), BUN 30, creatinine 1.04, CK 898. CXR and XR lumbar spine without acute abnormalities.    In the ED, she received robaxin, vancomycin , and tylenol .             REVIEW OF SYSTEMS:  A comprehensive review of systems was negative except for that written in the History of Present Illness.     Past Medical History:   Diagnosis Date    Arthritis     Asthma     Cerebrovascular accident (CVA) (HCC) 08/06/2022    Chronic renal disease, stage III [facility]) [698720] 10/13/2022    Depression     Diabetes (HCC)     Fibromyalgia     Fibromyalgia     Gastroparesis     Hyperlipemia     Hypertension     Neuropathy     Psychotic disorder (HCC)     Stroke [facility])       Past Surgical History:   Procedure Laterality Date    COLONOSCOPY      HYSTERECTOMY (CERVIX STATUS UNKNOWN)       Prior to Admission medications   Medication Sig Start Date End Date Taking? Authorizing Provider   pregabalin  (LYRICA ) 300 MG capsule Take 1 capsule by mouth 2 times daily for 364 days. DAW BRAND NAME ONLY Max Daily Amount: 600 mg 04/21/24 04/20/25 Yes Graylon Willma LABOR, MD   beclomethasone (QVAR REDIHALER) 40 MCG/ACT AERB inhaler Inhale 1 puff into the lungs in the morning and 1 puff in the evening. 03/07/24  Yes Graylon Willma LABOR, MD   aspirin  81 MG chewable tablet Take 1 tablet by mouth daily 11/29/23  Yes Graylon Willma LABOR, MD   albuterol  (PROVENTIL ) (2.5 MG/3ML) 0.083% nebulizer solution Take 3 mLs by nebulization 4 times  daily 12/17/22  Yes Haithcock, Pearley BRAVO, MD   omeprazole  (PRILOSEC) 40 MG delayed release capsule Take 1 capsule by mouth every morning (before breakfast) 08/03/24   Graylon Willma LABOR, MD   Continuous Glucose Sensor (FREESTYLE LIBRE 3 PLUS SENSOR) MISC Blood sugar checks before meals and at bedtime and as needed 07/06/24   Graylon Willma LABOR, MD   Continuous Glucose Receiver (FREESTYLE LIBRE 3 READER) DEVI Blood sugar checks before meals and at bedtime and as needed 06/26/24   Graylon Willma LABOR, MD   SITagliptin  (JANUVIA ) 100 MG tablet Take 1 tablet by mouth daily 05/13/24   Graylon Willma LABOR, MD   albuterol -ipratropium (COMBIVENT RESPIMAT) 20-100 MCG/ACT AERS inhaler Inhale 1 puff into the lungs every 6 hours 03/07/24   Graylon Willma LABOR, MD   hydrocortisone  2.5 % cream Apply topically with each change of dressing 03/03/24   Lesleigh Dagoberto CROME, MD   Continuous Glucose Sensor (FREESTYLE LIBRE 2 SENSOR) MISC Blood sugar checks before meals and at bedtime and as needed 09/22/23   Graylon Willma LABOR, MD   hydrocortisone  2.5 % cream  Apply topically to affected area with each wound dressing change  Patient not taking: Reported on 06/07/2024 09/03/23   Lesleigh Dagoberto CROME, MD   REPATHA  SURECLICK 140 MG/ML SOAJ INJECT AS DIRECTED EVERY 2 WEEKS 08/04/23   Haithcock, Pearley BRAVO, MD   lisinopril  (PRINIVIL ;ZESTRIL ) 40 MG tablet Take 1 tablet by mouth daily  Patient not taking: Reported on 11/19/2023    [provider]   torsemide  (DEMADEX ) 20 MG tablet Take 5 tablets by mouth daily 04/02/23   Allyson Montenegro, Delfino LABOR, MD   linaclotide  (LINZESS ) 290 MCG CAPS capsule Take 1 capsule by mouth every morning (before breakfast) 04/01/23   Allyson Montenegro, Delfino LABOR, MD   amLODIPine  (NORVASC ) 5 MG tablet Take 1 tablet by mouth daily 04/01/23   Allyson Montenegro, Delfino LABOR, MD   insulin  glargine (LANTUS ) 100 UNIT/ML injection vial Inject 14 Units into the skin nightly 04/01/23   Allyson Montenegro, Delfino LABOR, MD   insulin  lispro (HUMALOG ) 100 UNIT/ML SOLN injection vial Inject 0-4 Units into  the skin 4 times daily (before meals and nightly) 04/01/23   Allyson Montenegro, Delfino LABOR, MD   linezolid  (ZYVOX ) 600 MG tablet Take 1 tablet by mouth 2 times daily Bid FOR 14 DAYS  Patient not taking: Reported on 03/05/2023    [provider]   QUEtiapine  (SEROQUEL ) 25 MG tablet Take 0.5 tablets by mouth nightly  Patient not taking: Reported on 09/14/2023 12/17/22   Haithcock, Pearley BRAVO, MD   budesonide  (PULMICORT ) 0.25 MG/2ML nebulizer suspension Take 2 mLs by nebulization 2 times daily 12/17/22   Haithcock, Pearley BRAVO, MD   carvedilol  (COREG ) 12.5 MG tablet Take 1 tablet by mouth 2 times daily    [provider]   quinapril  (ACCUPRIL ) 40 MG tablet quinapril  40 mg tablet   TAKE 1 TABLET BY MOUTH EVERY NIGHT FOR HIGH BLOOD PRESSURE 06/17/22   Graylon Willma LABOR, MD   ipratropium (ATROVENT HFA) 17 MCG/ACT inhaler     [provider]   olopatadine (PATANOL) 0.1 % ophthalmic solution Apply 2 drops to eye 2 times daily  Patient not taking: Reported on 04/20/2023    Automatic Reconciliation, Ar     Allergies   Allergen Reactions    Iodinated Contrast Media Shortness Of Breath    Statins Myalgia     Lovastatin, pravastatin, simvastatin, atorvastatin, zetia, livalo, lovaza    Adhesive Tape Rash    Ampicillin Rash    Gabapentin Nausea And Vomiting    Lidocaine  Rash      Family History   Problem Relation Age of Onset    Breast Cancer Maternal Grandmother     Cancer Father         lung ca    Hypertension Father     Diabetes Mother     Hypertension Mother       Social History:  reports that she has never smoked. She has never been exposed to tobacco smoke. She has never used smokeless tobacco. She reports that she does not drink alcohol and does not use drugs.   Social Drivers of Health     Tobacco Use: Low Risk  (06/07/2024)    Patient History     Smoking Tobacco Use: Never     Smokeless Tobacco Use: Never     Passive Exposure: Never   Alcohol Use: Not At Risk (11/17/2023)    AUDIT-C     Frequency of Alcohol  Consumption: Never     Average Number of Drinks: Patient does  not drink     Frequency of Binge Drinking: Never   Financial Resource Strain: Medium Risk (11/17/2023)    Overall Financial Resource Strain (CARDIA)     Difficulty of Paying Living Expenses: Somewhat hard   Food Insecurity: Not on file (03/07/2024)   Transportation Needs: No Transportation Needs (03/07/2024)    PRAPARE - Therapist, art (Medical): No     Lack of Transportation (Non-Medical): No   Physical Activity: Insufficiently Active (11/17/2023)    Exercise Vital Sign     Days of Exercise per Week: 2 days     Minutes of Exercise per Session: 30 min   Stress: No Stress Concern Present (05/26/2023)    [employer] of Occupational Health - Occupational Stress Questionnaire     Feeling of Stress : Only a little   Social Connections: Unknown (05/26/2023)    Social Connection and Isolation Panel     Frequency of Communication with Friends and Family: Once a week     Frequency of Social Gatherings with Friends and Family: Once a week     Attends Religious Services: Never     Database administrator or Organizations: No     Attends Banker Meetings: Never     Marital Status: Not on file   Recent Concern: Social Connections - Socially Isolated (05/26/2023)    Social Connection and Isolation Panel     Frequency of Communication with Friends and Family: Once a week     Frequency of Social Gatherings with Friends and Family: Once a week     Attends Religious Services: Never     Database administrator or Organizations: No     Attends Banker Meetings: Never     Marital Status: Divorced   Catering manager Violence: Not At Risk (05/26/2023)    Humiliation, Afraid, Rape, and Kick questionnaire     Fear of Current or Ex-Partner: No     Emotionally Abused: No     Physically Abused: No     Sexually Abused: No   Depression: Not at risk (06/07/2024)    PHQ-2     PHQ-2 Score: 0   Housing Stability: Low Risk  (03/07/2024)     Housing Stability Vital Sign     Unable to Pay for Housing in the Last Year: No     Number of Times Moved in the Last Year: 0     Homeless in the Last Year: No   Interpersonal Safety: Not At Risk (08/17/2024)    Interpersonal Safety Domain Source: IP Abuse Screening     Physical abuse: Denies     Verbal abuse: Denies     Emotional abuse: Denies     Financial abuse: Denies     Sexual abuse: Denies   Utilities: Not At Risk (03/07/2024)    [facility] Utilities     Threatened with loss of utilities: No        Medications were reconciled to the best of my ability given all available resources at the time of admission. Route is PO if not otherwise noted.     Family and social history were personally reviewed, all pertinent and relevant details are outlined as above.    Objective:   BP (!) 160/61   Pulse 97   Temp 99.4 F (37.4 C) (Oral)   Resp 24   Ht 1.626 m (5' 4)   Wt 95.6 kg (210 lb 12.2 oz)   SpO2 96%  BMI 36.18 kg/m         PHYSICAL EXAM:   General: Alert x oriented x 3, awake, no acute distress,   HEENT: PEERL, EOMI, moist mucus membranes  Neck: Supple, no JVD, no meningeal signs  Chest: Clear to auscultation bilaterally   CVS: RRR, S1 S2 heard, no murmurs/rubs/gallops  Abd: Soft, non-tender, non-distended, +bowel sounds   Ext: No clubbing, no cyanosis,    Neuro/Psych: Pleasant mood and affect, CN 2-12 grossly intact, sensory grossly within normal limit, Strength 5/5 in all extremities  Cap refill: Brisk, less than 3 seconds  Pulses: 2+ radial pulses  Skin: Warm, dry, without rashes or lesions    Data Review:   I have independently reviewed and interpreted patient's lab and all other diagnostic data    Abnormal Labs Reviewed   CBC WITH AUTO DIFFERENTIAL - Abnormal; Notable for the following components:       Result Value    WBC 14.8 (*)     RBC 3.67 (*)     Hemoglobin 8.5 (*)     Hematocrit 27.9 (*)     MCV 76.0 (*)     MCH 23.2 (*)     RDW 18.7 (*)     Neutrophils % 85.8 (*)     Lymphocytes % 7.8 (*)      Neutrophils Absolute 12.68 (*)     Immature Granulocytes Absolute 0.07 (*)     All other components within normal limits   COMPREHENSIVE METABOLIC PANEL - Abnormal; Notable for the following components:    CO2 30 (*)     Glucose 196 (*)     BUN 30 (*)     Creatinine 1.04 (*)     Est, Glom Filt Rate 54 (*)     Alk Phosphatase 111 (*)     Albumin 2.8 (*)     Albumin/Globulin Ratio 0.7 (*)     All other components within normal limits   CK - Abnormal; Notable for the following components:    Total CK 898 (*)     All other components within normal limits   URINALYSIS WITH MICROSCOPIC - Abnormal; Notable for the following components:    pH, Urine 8.5 (*)     Protein, UA TRACE (*)     Leukocyte Esterase, Urine SMALL (*)     BACTERIA, URINE 1+ (*)     All other components within normal limits       Results       Procedure Component Value Units Date/Time    Culture, Blood 2 [[id number]] Collected: 08/17/24 1321    Order Status: Sent Specimen: Blood Updated: 08/17/24 1342    Culture, Wound (with Gram Stain) [[id number]] Collected: 08/17/24 1321    Order Status: Sent Specimen: Leg Updated: 08/17/24 1335    Culture, Blood 1 [[id number]] Collected: 08/17/24 1316    Order Status: Sent Specimen: Blood Updated: 08/17/24 1341    Culture, MRSA, Screening [[id number]] Collected: 08/17/24 1316    Order Status: Sent Specimen: Naris, Bilateral Updated: 08/17/24 1335    Urine Culture Hold Sample [[id number]] Collected: 08/17/24 0818    Order Status: Completed Specimen: Urine Updated: 08/17/24 0823     Specimen HOld       Urine on hold in Microbiology dept for 2 days.  If unpreserved urine is submitted, it cannot be used for addtional testing after 24 hours, recollection will be required.  IMAGING:   XR LUMBAR SPINE (2-3 VIEWS)   Final Result   Scoliosis and multilevel lumbosacral degenerative disc disease without acute   abnormality.                  Electronically signed by DARICE COLON      XR CHEST PORTABLE   Final  Result      No acute process on portable chest.         Electronically signed by DARICE COLON           ECG/ECHO:    Encounter Date: 08/17/24   EKG 12 Lead   Result Value    Ventricular Rate 95    Atrial Rate 95    P-R Interval 142    QRS Duration 78    Q-T Interval 334    QTc Calculation (Bazett) 419    P Axis 59    R Axis 23    T Axis 41    Diagnosis      Normal sinus rhythm  Normal ECG  When compared with ECG of 15-Dec-2022 19:04,  fusion complexes are no longer present       03/25/23    ECHO (TTE) COMPLETE (PRN CONTRAST/BUBBLE/STRAIN/3D) 03/27/2023 10:38 PM (Final)    Interpretation Summary    Left Ventricle: Normal left ventricular systolic function with a visually estimated EF of 60 - 65%. Left ventricle size is normal. Normal wall thickness. Normal wall motion. Normal diastolic function.    Mitral Valve: Severely calcified leaflet, at the anterior and posterior leaflets.    Tricuspid Valve: The estimated RVSP is 33 mmHg.    Pericardium: There is evidence of epicardial fat. There is mild posterior pericardial calcification. No pericardial effusion.    Image quality is technically difficult. Contrast used: Definity . Technically difficult study, technically difficult Doppler study and procedure performed with the patient in a supine position.    Signed by: Laquetta Commander, MD on 03/27/2023 10:38 PM        Notes reviewed from all clinical/nonclinical/nursing services involved in patient's clinical care. Care coordination discussions were held with appropriate clinical/nonclinical/ nursing providers based on care coordination needs.     Assessment:   Given the patient's current clinical presentation, there is a high level of concern for decompensation if discharged from the emergency department. Complex decision making was performed, which includes reviewing the patient's available past medical records, laboratory results, and imaging studies.    Active Problems:    * No active hospital problems. *  Resolved  Problems:    * No resolved hospital problems. *      Plan:     #sepsis  #cellulitis, b/l LE wound  Code sepsis was called on Jamie Bryant    Sepsis present due to SIRS at least 2/4 HR >90 and WBC>12 or<4   Sepsis Source  Cellulitis  Lactic Acid Greater > 2, Repeat Lactic Acid ordered within 4 hrs NO  Severe? No  Shock present? no  Sepsis OrderSet Used? yes    Sepsis Re-Assessment Documentation:     Date: 08/17/2024   Time: 4:32 PM    The sepsis reassessment was performed at 1355 time  -continue abx tx with vancomycin  and cefepime   -follow blood cultures, and wound culture  -She was followed in wound care until March 2025 when she was lost to follow-up.  Will order wound care here for    #Fall  -Endorses mechanical fall at her facility.  Denies loss of consciousness, or  lightheadedness.  Lives in independent living section of her facility.  She has a son in the area who is currently out of town, but will be back over the weekend  -Telemetry  - CT head, CT C-spine  - PT/OT    #DM2  #Gastroparesis  #Diabetic neuropathy  - hold home DDP 4   -ISS    #Hypertension  - Continue home carvedilol  at 12.5mg  BID with holding parameters, and Norvasc     #Dyslipidemia     #GERD  - PPI    #dementia  -supportive care    DIET: No diet orders on file   ISOLATION PRECAUTIONS: No active isolations  CODE STATUS: full code  DVT PROPHYLAXIS: lovenox   ANTICIPATED DISCHARGE: 2-3 days  ANTICIPATED DISPOSITION: Home with wound care    Signed By: Arnette Solian, MD     August 17, 2024         Please note that this dictation may have been completed with [employer], the computer voice recognition software.  Quite often unanticipated grammatical, syntax, homophones, and other interpretive errors are inadvertently transcribed by the computer software.  Please disregard these errors.  Please excuse any errors that have escaped final proofreading.

## 2024-08-17 NOTE — Plan of Care (Addendum)
 Problem: Occupational Therapy - Adult  Goal: By Discharge: Performs self-care activities at highest level of function for planned discharge setting.  See evaluation for individualized goals.  Description: FUNCTIONAL STATUS PRIOR TO ADMISSION:  Patient was ambulatory using RW   , Prior Level of Assist for ADLs: Independent,  ,  ,  ,  ,  , Prior Level of Assist for Homemaking: Independent,  , Prior Level of Assist for Transfers: Independent, Active Driver: No     HOME SUPPORT: Patient lived alone at [facility] and reports independence with ADLS/IADLS .    Occupational Therapy Goals:  Initiated 08/17/2024  1.  Patient will perform grooming with Minimal Assist in sitting within 7 day(s).  2.  Patient will perform bathing with Maximal Assist within 7 day(s).  3.  Patient will perform lower body dressing with Maximal Assist within 7 day(s).  4.  Patient will perform toilet transfers with Maximal Assist  within 7 day(s).  5.  Patient will perform all aspects of toileting with Moderate Assist within 7 day(s).  6.  Patient will participate in upper extremity therapeutic exercise/activities with Moderate Assist for 5 minutes within 7 day(s).    7.  Patient will utilize energy conservation techniques during functional activities with verbal cues within 7 day(s).   Outcome: Progressing   OCCUPATIONAL THERAPY EVALUATION    Patient: Jamie Bryant (83 y.o. female)  Date: 08/17/2024  Primary Diagnosis: No admission diagnoses are documented for this encounter.         Precautions:                    ASSESSMENT :  The patient is limited by decreased functional mobility, independence in ADLs, high-level IADLs, ROM, strength, body mechanics, activity tolerance, endurance, safety awareness, cognition, coordination, balance, posture, increased pain levels s/p admission for falls. Prior to admission pt was living at independent living and reports independence with ADLS/IADLS with use of RW, no other falls reported.    On arrival pt  was received semi-reclined in Ed bed, agreeable to therapy, cleared by RN. Pt was Max-Total Ax2 for bed mobility with fair sitting balance. Max x2 for sit<>stand unsafe to progress to lateral steps to [facility]. Pt with significant drop in BP for transfers but did not reports symptoms of dizziness. Pt was Total A x2 for getting back into bed. Of note pt with intermittent confusion throughout session. Pt was left semi-reclined in bed, vss, all needs met, call bell in reach. Will continue to follow in acute setting.     08/17/24 1215 08/17/24 1218 08/17/24 1223   Vital Signs   Pulse 90  --   --    Respirations 24  --   --    BP (!) 155/44 (!) 183/74 124/69   MAP (Calculated) 81 110 87   BP Location Left upper arm Left upper arm Left upper arm   BP Method Automatic Automatic Automatic   Patient Position Semi fowlers Sitting Semi fowlers      08/17/24 1224   Vital Signs   Pulse 97   Respirations  --    BP (!) 160/61   MAP (Calculated) 94   BP Location Left upper arm   BP Method Automatic   Patient Position Semi fowlers     Functional Outcome Measure:  The patient scored 10/24 on the AMPAC outcome measure        PLAN :  Recommendations and Planned Interventions:   self care training, therapeutic activities,  functional mobility training, balance training, therapeutic exercise, endurance activities, patient education, home safety training, and family training/education    Frequency/Duration: OT Plan of Care: 5 times/week    Recommendations for mobility with staff: Recommend that staff completes patient mobility with assist x2 using Camie Ip.    Recommendations for toileting with staff:  recommended toilet device: a bed pan.     Recommendation for discharge: (in order for the patient to meet his/her long term goals):   Moderate intensity short-term skilled occupational therapy up to 5x/week    Other factors to consider for discharge: patient's current support system is unable to meet their requirements for physical assistance,  poor safety awareness, impaired cognition, high risk for falls, not safe to be alone, and concern for safely navigating or managing the home environment    IF patient discharges home will need the following DME: continuing to assess with progress       SUBJECTIVE:   Patient stated, "my legs hurt."  OBJECTIVE DATA SUMMARY:     Past Medical History:   Diagnosis Date    Arthritis     Asthma     Cerebrovascular accident (CVA) (HCC) 08/06/2022    Chronic renal disease, stage III [facility]) [[id number]] 10/13/2022    Depression     Diabetes (HCC)     Fibromyalgia     Fibromyalgia     Gastroparesis     Hyperlipemia     Hypertension     Neuropathy     Psychotic disorder (HCC)     Stroke [facility])      Past Surgical History:   Procedure Laterality Date    COLONOSCOPY      HYSTERECTOMY (CERVIX STATUS UNKNOWN)            Expanded or extensive additional review of patient history:   Social/Functional History  Lives With: Alone  Type of Home: Apartment  Home Layout: One level  Home Access: Occupational psychologist Shower/Tub: Walk-in shower (tub w/ cut out)  Oceanographer: Paediatric nurse, Grab bars in shower (not using/ doesn't fit)  Home Equipment: Environmental consultant - 4-Wheeled, Environmental consultant - Rolling  Has the patient had two or more falls in the past year or any fall with injury in the past year?: Yes  Prior Level of Assist for ADLs: Independent  Prior Level of Assist for Homemaking: Independent  Prior Level of Assist for Ambulation: Independent community ambulator, with or without device  Prior Level of Assist for Transfers: Independent  Active Driver: No  Government social research officer Info: cousins  Mode of Transportation: Set designer          EXAMINATION OF PERFORMANCE DEFICITS:    Cognitive/Behavioral Status:  Orientation  Overall Orientation Status: Within Normal Limits  Orientation Level: Oriented X4              Vision/Perceptual:          Vision  Vision: Within Functional Limits               Range of Motion:   AROM: Generally decreased, functional          Strength:  Strength: Generally decreased, functional      Coordination:  Coordination: Generally decreased, functional     Coordination: Generally decreased, functional      Tone & Sensation:   Tone: Normal  Sensation: Intact          Functional Mobility and Transfers for ADLs:    Bed Mobility:     Bed Mobility Training  Bed Mobility Training: Yes (Simultaneous filing. User may not have seen previous data.)  Overall Level of Assistance: Substantial/Maximal assistance;2 Person assistance  Interventions: Verbal cues;Weight shifting training/pressure relief;Safety awareness training;Manual cues  Rolling: Substantial/Maximal assistance;2 Person assistance (Simultaneous filing. User may not have seen previous data.)  Supine to Sit: Substantial/Maximal assistance;2 Person assistance (Simultaneous filing. User may not have seen previous data.)  Sit to Supine: Substantial/Maximal assistance;2 Person assistance (Simultaneous filing. User may not have seen previous data.)  Scooting: Substantial/Maximal assistance;2 Person assistance (Simultaneous filing. User may not have seen previous data.)    Transfers:      Art therapist: Yes (Simultaneous filing. User may not have seen previous data.)  Interventions: Safety awareness training;Weight shifting training/pressure relief;Manual cues;Verbal cues  Sit to Stand: 2 Person assistance;Substantial/Maximal assistance  Stand to Sit: 2 Person assistance;Substantial/Maximal assistance                     Balance:      Balance  Sitting: Impaired (Simultaneous filing. User may not have seen previous data.)  Sitting - Static: Fair (occasional) (Simultaneous filing. User may not have seen previous data.)  Sitting - Dynamic: Fair (occasional)  Standing: Impaired (Simultaneous filing. User may not have seen previous data.)  Standing - Static: Fair;Constant support (Simultaneous filing. User may not have seen previous data.)  Standing - Dynamic: Fair;Constant  support (Simultaneous filing. User may not have seen previous data.)      ADL Assessment:          Feeding: Independent;Setup       Grooming: Moderate assistance       UE Bathing: Moderate assistance            LE Bathing: Maximum assistance       UE Dressing: Moderate assistance       LE Dressing: Dependent/Total       Toileting: Dependent/Total                         ADL Intervention and task modifications:                  [employer] AM-PACTM 6 Clicks                                                       Daily Activity Inpatient Short Form  How much help from another person does the patient currently need... Total; A Lot A Little None   1.  Putting on and taking off regular lower body clothing? [x]   1 []   2 []   3 []   4   2.  Bathing (including washing, rinsing, drying)? [x]   1 []   2 []   3 []   4   3.  Toileting, which includes using toilet, bedpan or urinal? [x]  1 []   2 []   3 []   4   4.  Putting on and taking off regular upper body clothing? []   1 [x]   2 []   3 []   4   5.  Taking care of personal grooming such as brushing teeth? []   1 [x]   2 []   3 []   4   6.  Eating meals? []   1 []   2 [x]   3 []   4    2007, Trustees of [place], under license to [employer]. All rights reserved     Score: 10/24     Interpretation of Tool:  Represents clinically-significant functional categories (i.e. Activities of daily living).    Cutoff score 39.4 (19) correlates to a good likelihood of discharging home versus a facility  Diane U. Aneta, Ronal Broody, Vinoth K. Ranganathan, Sandra D. Passek, Gilmore RAMAN. Waldemar Dale EMERSON Aneta, AM-PAC "6-Clicks" Functional Assessment Scores Predict Acute Care Hospital Discharge Destination, Physical Therapy, Volume 94, Issue 9, 29 August 2013, Pages [phone], [email or web address]    Pain Rating:  Increased pain in legs  Pain Intervention(s):   nursing notified and addressing, rest, and repositioning    Activity Tolerance:   Poor, requires frequent rest  breaks, and signs and symptoms of orthostatic hypotension    After treatment:   Patient left in no apparent distress in bed, Call bell within reach, and Side rails x3    COMMUNICATION/EDUCATION:   The patient's plan of care was discussed with: physical therapist and registered nurse         Thank you for this referral.  Tawni Admire, OT  Minutes: 31    Occupational Therapy Evaluation Charge Determination   History Examination Decision-Making   LOW Complexity : Brief history review  LOW Complexity: 1-3 Performance deficits relating to physical, cognitive, or psychosocial skills that result in activity limitations and/or participation restrictions LOW Complexity: No comorbidities that affect functional and  no verbal  or physical assist needed to complete eval tasks   Based on the above components, the patient evaluation is determined to be of the following complexity level: Low

## 2024-08-17 NOTE — ED Notes (Signed)
Report given to kelly rn

## 2024-08-17 NOTE — ED Triage Notes (Signed)
 Patient arrives to ER via EMS from home. Reports falling Tuesday morning and was unable to get up. Per EMS patient lives alone. Has a life alert button but did not use it when she fell. Says she has been on the floor since. When EMS arrived she was on the phone with her son. Reports lower back pain. Denies hitting head, LOC or thinners.     Hx CVA

## 2024-08-17 NOTE — Plan of Care (Signed)
 FUNCTIONAL STATUS PRIOR TO ADMISSION: Patient was modified independent using a rolling walker for functional mobility.    HOME SUPPORT PRIOR TO ADMISSION: The patient lived alone.    Physical Therapy Goals  Initiated 08/17/2024  1.  Patient will move from supine to sit and sit to supine in bed with moderate assistance within 7 day(s).    2.  Patient will perform sit to stand with moderate assistance within 7 day(s).  3.  Patient will transfer from bed to chair and chair to bed with moderate assistance using the least restrictive device within 7 day(s).  4.  Patient will ambulate with moderate assistance for 25 feet with the least restrictive device within 7 day(s).         PHYSICAL THERAPY EVALUATION    Patient: Jamie Bryant (83 y.o. female)  Date: 08/17/2024  Primary Diagnosis: No admission diagnoses are documented for this encounter.       Precautions: Fall      ASSESSMENT :   DEFICITS/IMPAIRMENTS:   The patient is limited by decreased functional mobility, independence in ADLs, ROM, strength, body mechanics, activity tolerance, safety awareness, balance, orthostatic hypotension and increased pain levels. She is s/p a fall in her home 08/16/2024, now unable to stand and walk. At baseline pt lives alone in a ILF, ambulates w/ a wheeled walker.  Today she completed bed mobility and sit<->stand w/ max A x2, c/o bil LE pain, had to sit.  She was not able to take steps in place or ambulate today.  There was a significant drop in BP after standing activity, pt denied symptoms though seemed confused at times during this session. BP improved back in the bed.       Based on the impairments listed above the patient is functioning far below her baseline of indep mobilizing and at risk for additional falls.   Noted the plan is now for admission.  Acute therapy will follow.      Patient Vitals for the past 6 hrs:   Pulse BP Patient Position   08/17/24 1224 97 (!) 160/61 Semi fowlers   08/17/24 1223 -- 124/69 Semi fowlers    08/17/24 1218 -- (!) 183/74 Sitting   08/17/24 1215 90 (!) 155/44 Semi fowlers       Functional Outcome Measure:  The patient scored 9/24 on the AM PAC outcome measure.  Cutoff score <=171,2,3 had higher odds of discharging home with home health or need of SNF/IPR.         PLAN :  Recommendations and Planned Interventions:   bed mobility training, transfer training, gait training, therapeutic exercises, patient and family training/education, and therapeutic activities    Frequency/Duration: Patient will be followed by physical therapy to address goals, PT Plan of Care: 5 times/week to address goals.      Recommendations for mobility with staff: Recommend that staff assists the patient to meet their mobility and care needs at the bed level until progress has been made with therapy VS the Camie Ip for bed<->chair transfers w/ caution d/t LE wounds.    Recommendations for toileting with staff:  recommended toilet device: a bed pan and a Purewick.     Recommend for next PT session: sit to stand transfers and bed to bedside chair transfers    Recommendation for discharge: (in order for the patient to meet his/her long term goals):   Moderate intensity short-term skilled physical therapy up to 5x/week    Other factors to consider for discharge: lives  alone, no local support, high risk for falls, not safe to be alone, and concern for safely navigating or managing the home environment    IF patient discharges home will need the following DME: continuing to assess with progress                SUBJECTIVE:   Patient stated "The machine did this to my legs. The dead man on 3 ran it .  (Type of machine unclear)    OBJECTIVE DATA SUMMARY:       Past Medical History:   Diagnosis Date    Arthritis     Asthma     Cerebrovascular accident (CVA) (HCC) 08/06/2022    Chronic renal disease, stage III [facility]) [[id number]] 10/13/2022    Depression     Diabetes (HCC)     Fibromyalgia     Fibromyalgia     Gastroparesis     Hyperlipemia      Hypertension     Neuropathy     Psychotic disorder (HCC)     Stroke [facility])      Past Surgical History:   Procedure Laterality Date    COLONOSCOPY      HYSTERECTOMY (CERVIX STATUS UNKNOWN)         Home Situation:  Social/Functional History  Lives With: Alone  Type of Home: Apartment  Home Layout: One level  Home Access: Occupational psychologist Shower/Tub: Walk-in shower (tub w/ cut out)  Oceanographer: Paediatric nurse, Grab bars in shower (not using/ doesn't fit)  Home Equipment: Environmental consultant - 4-Wheeled, Environmental consultant - Rolling  Has the patient had two or more falls in the past year or any fall with injury in the past year?: Yes  Prior Level of Assist for ADLs: Independent  Prior Level of Assist for Homemaking: Independent  Prior Level of Assist for Ambulation: Independent community ambulator, with or without device  Prior Level of Assist for Transfers: Independent  Active Driver: No  Government social research officer Info: cousins  Mode of Transportation: Company secretary Status:  Orientation  Overall Orientation Status: Within Normal Limits  Orientation Level: Oriented X4       Skin: bil leg wounds     Edema: BLE    Hearing:   Hearing  Hearing: Within Functional Limits    Vision/Perceptual:          Vision  Vision: Within Barrister's clerk:    Strength: Generally decreased, functional    Tone & Sensation:   Tone: Normal       Coordination:  Coordination: Generally decreased, functional    Range Of Motion:  AROM: Generally decreased, functional       Functional Mobility:  Bed Mobility:     Bed Mobility Training  Bed Mobility Training: Yes   Interventions: Verbal cues;Weight shifting training/pressure relief;Safety awareness training;Manual cues  Rolling: Substantial/Maximal assistance;2 Person assistance  Supine to Sit: Substantial/Maximal assistance;2 Person assistance   Sit to Supine: Substantial/Maximal assistance;2 Person assistance   Scooting: Substantial/Maximal assistance;2 Person assistance    Transfers:  Transfer Training  Transfer Training: Yes   Interventions: Safety awareness training;Weight shifting training/pressure relief;Manual cues;Verbal cues  Sit to Stand: 2 Person assistance;Substantial/Maximal assistance  Stand to Sit: 2 Person assistance;Substantial/Maximal assistance  Balance:               Balance  Sitting: Impaired  Sitting - Static: Fair   Sitting - Dynamic: Fair (occasional)  Standing: Impaired  Standing - Static: Fair;Constant support   Standing - Dynamic: Fair;Constant support   Ambulation/Gait Training:                       Gait  Gait Training: No                                                                                                                                                                                                                                                            [employer] AM-PAC      Basic Mobility Inpatient Short Form (6-Clicks) Version 2  How much HELP from another person do you currently need... (If the patient hasn't done an activity recently, how much help from another person do you think they would need if they tried?) Total A Lot A Little None   1.  Turning from your back to your side while in a flat bed without using bedrails? []   1 [x]   2 []   3  []   4   2.  Moving from lying on your back to sitting on the side of a flat bed without using bedrails? []   1 [x]   2 []   3  []   4   3.  Moving to and from a bed to a chair (including a wheelchair)? [x]   1 []   2 []   3  []   4   4. Standing up from a chair using your arms (e.g. wheelchair or bedside chair)? []   1 [x]   2 []   3  []   4   5.  Walking in hospital room? [x]   1 []   2 []   3  []   4   6.  Climbing 3-5 steps with a railing? [x]   1 []   2 []   3  []   4     Raw Score: 9/24                            Cutoff score <=171,2,3 had higher odds of discharging home with home health or need of SNF/IPR.    1. Diane U. Jette, Ronal Broody, Vinoth K. Ranganathan, Sandra D. Passek, Gilmore RAMAN. Waldemar Dale EMERSON Aneta.  Validity of the AM-PAC "6-Clicks" Inpatient Daily Activity  and Basic Mobility Short Forms. Physical Therapy Mar 2014, 94 (3) 379-391; DOI: 10.2522/ptj.20130199  2. Warren M, Knecht J, Verheijde J, Tompkins J. Association of AM-PAC 6-Clicks Basic Mobility and Daily Activity Scores With Discharge Destination. Phys Ther. 2021 Apr 4;101(4):pzab043. doi: 10.1093/ptj/pzab043. PMID: 66482536.  3. Herbold J, Rajaraman D, Waddell RAMAN, Agayby K, [place] S. Activity Measure for Post-Acute Care 6-Clicks Basic Mobility Scores Predict Discharge Destination After Acute Care Hospitalization in Select Patient Groups: A Retrospective, Observational Study. Arch Rehabil Res Clin Transl. 2022 Jul 16;4(3):100204. doi: 10.1016/j.arrct.[id number]. PMID: 63876017; PMCID: EFR0517973.  4. Aneta DELENA Darryle RAMAN, Coster W, Ni P. AM-PAC Short Forms Manual 4.0. Revised 01/2019.                                                                                                                                                                                                                              Pain Rating:  BLE,did not quantify, worse w/ activity and dependent position    Pain Intervention(s):   nursing notified, rest, and repositioning    Activity Tolerance:   Fair     After treatment:   Patient left in no apparent distress in bed, Call bell within reach, and Side rails x2 (stretcher)    COMMUNICATION/EDUCATION:   The patient's plan of care was discussed with: occupational therapist, registered nurse, case manager, and geri NP    Patient Education  Education Given To: Patient  Education Provided: Role of Therapy;Plan of Care;Fall Prevention Strategies  Education Method: Verbal  Barriers to Learning: None  Education Outcome: Continued education needed    Thank you for this referral.  Doyal ONEIDA Mulberry, PT  Minutes: 28      Physical Therapy Evaluation Charge Determination   History Examination Presentation Decision-Making   MEDIUM   Complexity : 1-2 comorbidities / personal factors will impact the outcome/ POC  LOW Complexity : 1-2 Standardized tests and measures addressing body structure, function, activity limitation and / or participation in recreation  LOW Complexity : Stable, uncomplicated  AM-PAC  HIGH    Based on the above components, the patient evaluation is determined to be of the following complexity level: Low

## 2024-08-17 NOTE — ED Notes (Addendum)
 Senior Care Services  [phone]    [facility] ED Geriatric Consult Team  Kace Hartje [facility] APRN-NP   Horacio Sharps CM    Patient Name: Jamie Bryant  Date of Birth: 09-26-1941    Date of Initial Consult: 08/17/2024  Reason for Consult: ACP, geriatric medication assessment, goals of care  Requesting Provider: Dr Jerilee Piety      SUMMARY:   VICTOIRE DEANS is a 83 y.o. 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     Current medical issues leading to Geriatric Consult  involvement include:   []  SNF Transfer  []  Prior admission within 30 days  [x]  Geriatric Medication Assessment  - Patient unable to recall medications, concern for polypharmacy and medication noncompliance.  []  Complex Medical Conditions  [x]  Complex Case Management and/or SDOH  -Lives independently, known history of memory impairment- patient fell on 08/16/24, life alert necklace was in place, despite the patient laid on the floor for hours and did not use alert system.   - Overall debility and decline in being able to care for self independently.  [x]  Goals of Care      GERIATRIC DIAGNOSES:   Cellulitis of right lower extremity  Frequent falls  Polypharmacy  Debility, physical     PLAN:   Patient originally scheduled to be  evaluated by PT/OT for possible placement, secondary to back pain, after further evaluation discussion with the ED physician patient to be admitted for cellulitis of right lower extremity and frequent falls.  Patient will need ongoing support by case management, unsafe to be living alone independently, concerned patient had her life alert necklace in place, however no known history of dementia and memory impairment.  Unable to perform geriatric medication assessment with the patient's, call placed to pharmacy on file for updated list and recently filled medications.  Patient unable to recall current medications.  Recommend during this hospitalization that patient have a MediCAID screening performed for LTC during this hospitalization. Will need to get patients two children involved due to safety concerns.   []  Initial consult note routed to primary continuity provider and/or primary health care team members  [x]  Communicated plan of care with: ED and/or Hospital Health Care Team  ED physician-Dr. Piety, nursing, Horacio Sharps, CM  ADVANCE CARE PLANNING / TREATMENT PREFERENCES:     GOALS OF CARE:  [] -Comfort   [] -Cure   [x] -Prolong life   [x] -Recovery from acute on chronic illness   [x] -Rehabilitation  [] -Other:         TREATMENT PREFERENCES:     Patient and family's personal goals include:   Patient is agreeable to hospital admission, verbalizes that she needs more support and is no longer feels safe where she is currently residing independently.  - Patient states that her son works at nighttime, stating that she does not see him often due to his schedule of work and family.   -  Patient states that her cousin assist her with getting to and from doctors appointments, picking up medications and grocery shopping.  - Patient states that she has been to a SNF but concerned.     Important upcoming milestones or family events: None    The patient identifies the following as important for living well: Patient identifies  that she feels like she is no longer safe living alone, and voices that she needs more support.    Advance Care Planning:  [x]  Geriatric Team has updated the ACP Navigator with Health Care Decision Maker and Patient Capacity    The patient has appointed the following active healthcare agents:    Primary Decision Maker: Wonda Kathlyne JASMINE Child [phone]    Primary Decision Maker: wonda kathlyne - Child [phone]    Secondary Decision Maker: Dario Bernice JASMINE Child [phone]    Secondary Decision Maker: LENNOX LENNOX - Other    The Patient has the following current code status:    Code Status: Full Code       HISTORY:     History obtained from: patient, chart review, nursing, discussion with ED physician    CHIEF COMPLAINT: Patient arrives via EMS after ground-level fall and inability to get up.    HPI/SUBJECTIVE:    The patient is:   [x]  Verbal and participatory  []  Non-participatory due to:        Patient is alert, appears fatigued, able to verify her name, date of birth and states that she is in the emergency room today after falling in the bathroom yesterday, crawling to the living room where she states she slept for a long time and then attempted to pull her self up using a chair and was unable to.  Patient with life alert necklace in place, states that she did not press the button because she was afraid that there was not a nurse or anyone that could help her get up and that her door was locked.  I introduced myself and role with Senior care services, patient receptive to discussion.  Patient was unable to recognize daily medications, endorses that she no longer feels safe where she currently lives and knows that she needs more assistance in the home.  Patient states that she has 1 son that lives locally in [place] but works overnight, and that her daughter lives in [place]  and is not in the best health per the patient.  She states that she has a cousin whom is about the same age as her that  assists with transportation, obtaining groceries and picking up medications.  Patient states that she is no longer going to the pain management clinic, is no longer open to wound care clinic and was supposed to be going to a NEW PCP appt. Today (08/17/24) at the [facility], [phone].  Pending DMV re-evaluation for ability to get her driver license back.   SSI /retirement monthly= $2000/ month  1630-  Call placed to son, Kathlyne Wonda, [phone], able to verify name and DOB for HIPAA.   -He states that he is currently in [place], returning on Friday 8/22.   -States that he has been trying to look for a place to move her, but he is unable to take her in his home due to 3 flights of steps in the home.   -He states that she is still managing her finances at this time.   -He states that they tried to start looking for different apartments but due to lack  of financial support, they have been unable to relocate.     1645-  Gave verbal permission to reach out to [place], [phone], no answer, message left to return call to case management.   1651-  Call placed to patients sister, Aleck Pizza [phone], lives in [place].  And they pulled [place], sister ([place] ) onto the line, also present for the conversation includes myself and Horacio Sharps, CM. Able to verify name and DOB for HIPAA.   -No LTC health insurance policy.   -SSI and retirement only.  -Cousin Aleck Collie assists with transport, groceries and errands.  -Patient worked prior as a Acupuncturist at [employer].   -They state that they have tried to assist with hiring services, such as caregivers to come in and help but the patient gets very upset when they try to step in and help and will stop speaking to them.   -APS has been called in the past and patient refuses help.   -Currently in a hoarding situation, old food and dirty dishes all over, fruit flies- in the wound on the legs and in her mouth, trash  everywhere, they state that this was last week.    -PACE did qualify for the program but she turned it down because she didn't like the food they provided.   -Since the strokes patient has been very difficult, she used to be very clean and meticulous, now she is a Chartered loss adjuster.     SDOH: identifies difficulty caring for herself, concern for safety, medication management   ROS / FUNCTIONAL ASSESSMENT:     [x]  Patient's current DME:   [x]  Patient's base line functional status  -Patient lives independently at [facility] since 2018.  - Independent with ADL/IADLs- cousin drives to get groceries, medications and to [place] appts.   [x]  Physical Therapy Consult Ordered- refer to note for details  [x]  Occupational Therapy Consult Ordered- refer to note for details  []  Other     PSYCHOSOCIAL/SPIRITUAL SCREENING:     Social Drivers of Health     Tobacco Use: Low Risk  (06/07/2024)    Patient History     Smoking Tobacco Use: Never     Smokeless Tobacco Use: Never     Passive Exposure: Never   Alcohol Use: Not At Risk (11/17/2023)    AUDIT-C     Frequency of Alcohol Consumption: Never     Average Number of Drinks: Patient does not drink     Frequency of Binge Drinking: Never   Financial Resource Strain: Medium Risk (11/17/2023)    Overall Financial Resource Strain (CARDIA)     Difficulty of Paying Living Expenses: Somewhat hard   Food Insecurity: Not on file (03/07/2024)   Transportation Needs: No Transportation Needs (03/07/2024)    PRAPARE - Transportation     Lack of Transportation (Medical): No     Lack of Transportation (Non-Medical): No   Physical Activity: Insufficiently Active (11/17/2023)    Exercise Vital Sign     Days of Exercise per Week: 2 days     Minutes of Exercise per Session: 30 min   Stress: No Stress Concern Present (05/26/2023)    [employer] of Occupational Health - Occupational Stress Questionnaire     Feeling of Stress : Only a little   Social Connections: Unknown (05/26/2023)    Social Connection and  Isolation Panel     Frequency of Communication with Friends and Family: Once a week     Frequency of Social Gatherings  with Friends and Family: Once a week     Attends Religious Services: Never     Database administrator or Organizations: No     Attends Banker Meetings: Never     Marital Status: Not on file   Recent Concern: Social Connections - Socially Isolated (05/26/2023)    Social Connection and Isolation Panel     Frequency of Communication with Friends and Family: Once a week     Frequency of Social Gatherings with Friends and Family: Once a week     Attends Religious Services: Never     Database administrator or Organizations: No     Attends Banker Meetings: Never     Marital Status: Divorced   Catering manager Violence: Not At Risk (05/26/2023)    Humiliation, Afraid, Rape, and Kick questionnaire     Fear of Current or Ex-Partner: No     Emotionally Abused: No     Physically Abused: No     Sexually Abused: No   Depression: Not at risk (06/07/2024)    PHQ-2     PHQ-2 Score: 0   Housing Stability: Low Risk  (03/07/2024)    Housing Stability Vital Sign     Unable to Pay for Housing in the Last Year: No     Number of Times Moved in the Last Year: 0     Homeless in the Last Year: No   Interpersonal Safety: Not At Risk (08/17/2024)    Interpersonal Safety Domain Source: IP Abuse Screening     Physical abuse: Denies     Verbal abuse: Denies     Emotional abuse: Denies     Financial abuse: Denies     Sexual abuse: Denies   Utilities: Not At Risk (03/07/2024)    AHC Utilities     Threatened with loss of utilities: No       []  Palliative Medicine Consult Ordered  []  Hospice Consult Ordered    Any spiritual / religious / cultural beliefs and practices that will affect your patient's care?:  []  Yes /  [x]  No   If Yes to discuss with pastoral care during IDT     Does caregiver feel burdened by caring for their loved one:   []  Yes /  [x]  No /  []  No Caregiver Present/Available []  No Caregiver []   Pt Lives at Facility  If Yes to discuss with social work during IDT       PHYSICAL EXAM:     From Charity fundraiser flowsheet:  Wt Readings from Last 3 Encounters:   08/17/24 95.6 kg (210 lb 12.2 oz)   06/07/24 97.4 kg (214 lb 12.8 oz)   03/07/24 97.2 kg (214 lb 3.2 oz)     Blood pressure (!) 160/61, pulse 97, temperature 99.4 F (37.4 C), temperature source Oral, resp. rate 24, height 1.626 m (5' 4), weight 95.6 kg (210 lb 12.2 oz), SpO2 96%.    Last bowel movement, if known: unknown    PHYSICAL ASSESSMENT:   General: []  Oriented x3  []  Well appearing  []  Intubated  [x] Ill appearing  [x] Other: obese elderly female, poor historian, slow to respond at time  Mental Status: []  Normal mental status exam, no sedation or confusion   []  Drowsy  [x]  Confused  [] Other:  Cardiovascular: [x]  Regular rate/rhythm  []  Arrhythmia  []  Other:  Chest: [x]  Effort normal  [] Lungs clear  []  Respiratory distress  [] Tachypnea  []  Other:  Abdomen: []  Soft/non-tender  [  x] Normal appearance  []  Distended  []  Ascites  []  Other:  Neurological: []  Normal speech  []  Normal sensation  [x] Deficits present: slow to respond at times  Extremity: []  Normal skin color/temp  []  Clubbing/cyanosis  []  No edema  [x]  Other: chronic stasis wounds with erythema BLE, R>L, purulent drainage on the right, tender to touch, 2+ pulses throughout     HISTORY:     Principal Problem:    Cellulitis  Resolved Problems:    * No resolved hospital problems. *    Past Medical History:   Diagnosis Date    Arthritis     Asthma     Cerebrovascular accident (CVA) (HCC) 08/06/2022    Chronic renal disease, stage III [facility]) [[id number]] 10/13/2022    Depression     Diabetes (HCC)     Fibromyalgia     Fibromyalgia     Gastroparesis     Hyperlipemia     Hypertension     Neuropathy     Psychotic disorder (HCC)     Stroke [facility])       Past Surgical History:   Procedure Laterality Date    COLONOSCOPY      HYSTERECTOMY (CERVIX STATUS UNKNOWN)        Family History   Problem Relation Age of Onset     Breast Cancer Maternal Grandmother     Cancer Father         lung ca    Hypertension Father     Diabetes Mother     Hypertension Mother       History reviewed, no pertinent family history.  Social History     Tobacco Use    Smoking status: Never     Passive exposure: Never    Smokeless tobacco: Never   Substance Use Topics    Alcohol use: No     Allergies   Allergen Reactions    Iodinated Contrast Media Shortness Of Breath    Statins Myalgia     Lovastatin, pravastatin, simvastatin, atorvastatin, zetia, livalo, lovaza    Adhesive Tape Rash    Ampicillin Rash    Gabapentin Nausea And Vomiting    Lidocaine  Rash      Current Facility-Administered Medications   Medication Dose Route Frequency    sodium chloride  flush 0.9 % injection 5-40 mL  5-40 mL IntraVENous 2 times per day    sodium chloride  flush 0.9 % injection 5-40 mL  5-40 mL IntraVENous PRN    0.9 % sodium chloride  infusion   IntraVENous PRN    potassium chloride (KLOR-CON) extended release tablet 40 mEq  40 mEq Oral PRN    Or    potassium bicarb-citric acid  (EFFER-K ) effervescent tablet 40 mEq  40 mEq Oral PRN    Or    potassium chloride 10 mEq/100 mL IVPB (Peripheral Line)  10 mEq IntraVENous PRN    magnesium sulfate 2000 mg in 50 mL IVPB premix  2,000 mg IntraVENous PRN    ondansetron  (ZOFRAN -ODT) disintegrating tablet 4 mg  4 mg Oral Q8H PRN    Or    ondansetron  (ZOFRAN ) injection 4 mg  4 mg IntraVENous Q6H PRN    polyethylene glycol (GLYCOLAX ) packet 17 g  17 g Oral Daily PRN    acetaminophen  (TYLENOL ) tablet 650 mg  650 mg Oral Q6H PRN    Or    acetaminophen  (TYLENOL ) suppository 650 mg  650 mg Rectal Q6H PRN    budesonide  (PULMICORT ) nebulizer suspension 250 mcg  0.25 mg  Nebulization BID    [START ON 08/18/2024] pantoprazole  (PROTONIX ) tablet 40 mg  40 mg Oral QAM AC    pregabalin  (LYRICA ) capsule 150 mg  150 mg Oral BID    alogliptin  (NESINA ) tablet 6.25 mg  6.25 mg Oral Daily    carvedilol  (COREG ) tablet 12.5 mg  12.5 mg Oral BID    [START ON 08/18/2024]  amLODIPine  (NORVASC ) tablet 5 mg  5 mg Oral Daily    [START ON 08/18/2024] vancomycin  (VANCOCIN ) 1,000 mg in sodium chloride  0.9 % 250 mL IVPB (Vial2Bag)  1,000 mg IntraVENous Q24H    Vancomycin  - Pharmacy Dosing   Other RX Placeholder    [START ON 08/18/2024] ceFEPIme  (MAXIPIME ) 2,000 mg in sodium chloride  0.9 % 100 mL IVPB (addEASE)  2,000 mg IntraVENous Q24H     Current Outpatient Medications   Medication Sig    pregabalin  (LYRICA ) 300 MG capsule Take 1 capsule by mouth 2 times daily for 364 days. DAW BRAND NAME ONLY Max Daily Amount: 600 mg    beclomethasone (QVAR REDIHALER) 40 MCG/ACT AERB inhaler Inhale 1 puff into the lungs in the morning and 1 puff in the evening.    aspirin  81 MG chewable tablet Take 1 tablet by mouth daily    albuterol  (PROVENTIL ) (2.5 MG/3ML) 0.083% nebulizer solution Take 3 mLs by nebulization 4 times daily    omeprazole  (PRILOSEC) 40 MG delayed release capsule Take 1 capsule by mouth every morning (before breakfast)    Continuous Glucose Sensor (FREESTYLE LIBRE 3 PLUS SENSOR) MISC Blood sugar checks before meals and at bedtime and as needed    Continuous Glucose Receiver (FREESTYLE LIBRE 3 READER) DEVI Blood sugar checks before meals and at bedtime and as needed    SITagliptin  (JANUVIA ) 100 MG tablet Take 1 tablet by mouth daily    albuterol -ipratropium (COMBIVENT RESPIMAT) 20-100 MCG/ACT AERS inhaler Inhale 1 puff into the lungs every 6 hours    hydrocortisone  2.5 % cream Apply topically with each change of dressing    Continuous Glucose Sensor (FREESTYLE LIBRE 2 SENSOR) MISC Blood sugar checks before meals and at bedtime and as needed    hydrocortisone  2.5 % cream Apply topically to affected area with each wound dressing change (Patient not taking: Reported on 06/07/2024)    REPATHA  SURECLICK 140 MG/ML SOAJ INJECT AS DIRECTED EVERY 2 WEEKS    lisinopril  (PRINIVIL ;ZESTRIL ) 40 MG tablet Take 1 tablet by mouth daily (Patient not taking: Reported on 11/19/2023)    torsemide  (DEMADEX ) 20 MG  tablet Take 5 tablets by mouth daily    linaclotide  (LINZESS ) 290 MCG CAPS capsule Take 1 capsule by mouth every morning (before breakfast)    amLODIPine  (NORVASC ) 5 MG tablet Take 1 tablet by mouth daily    insulin  glargine (LANTUS ) 100 UNIT/ML injection vial Inject 14 Units into the skin nightly    insulin  lispro (HUMALOG ) 100 UNIT/ML SOLN injection vial Inject 0-4 Units into the skin 4 times daily (before meals and nightly)    linezolid  (ZYVOX ) 600 MG tablet Take 1 tablet by mouth 2 times daily Bid FOR 14 DAYS (Patient not taking: Reported on 03/05/2023)    QUEtiapine  (SEROQUEL ) 25 MG tablet Take 0.5 tablets by mouth nightly (Patient not taking: Reported on 09/14/2023)    budesonide  (PULMICORT ) 0.25 MG/2ML nebulizer suspension Take 2 mLs by nebulization 2 times daily    carvedilol  (COREG ) 12.5 MG tablet Take 1 tablet by mouth 2 times daily    quinapril  (ACCUPRIL ) 40 MG tablet quinapril  40 mg tablet  TAKE 1 TABLET BY MOUTH EVERY NIGHT FOR HIGH BLOOD PRESSURE    ipratropium (ATROVENT HFA) 17 MCG/ACT inhaler     olopatadine (PATANOL) 0.1 % ophthalmic solution Apply 2 drops to eye 2 times daily (Patient not taking: Reported on 04/20/2023)          LAB AND IMAGING FINDINGS:     Lab Results   Component Value Date/Time    WBC 14.8 08/17/2024 07:10 AM    HGB 8.5 08/17/2024 07:10 AM    PLT 368 08/17/2024 07:10 AM     Lab Results   Component Value Date/Time    NA 140 08/17/2024 07:10 AM    K 4.1 08/17/2024 07:10 AM    CL 99 08/17/2024 07:10 AM    CO2 30 08/17/2024 07:10 AM    BUN 30 08/17/2024 07:10 AM    MG 2.2 04/01/2023 01:54 AM    PHOS 3.9 04/01/2023 01:54 AM      Lab Results   Component Value Date/Time    GLOB 4.0 08/17/2024 07:10 AM     Lab Results   Component Value Date/Time    INR 1.0 03/26/2023 01:12 AM    APTT 31.0 03/26/2023 01:12 AM      Lab Results   Component Value Date/Time    IRON 14 03/26/2023 01:12 AM    TIBC 284 03/26/2023 01:12 AM      No results found for: PH, PCO2, PO2  No components found for:  [facility]   Lab Results   Component Value Date/Time    TROPONINI <0.05 03/06/2020 09:47 AM      Labs and imaging reviewed.         Only check if applicable and billing time based rather than MDM    [x]    The total encounter time on this service date was 55   minutes which was spent performing a face-to-face encounter and personally completing the provider-level activities documented in the note.  This includes time spent prior to the visit and after the visit in direct care of the patient.  This time does not include time spent in any separately reportable services.   Thank you for the opportunity to participate in this patients care.     Yazmine Sorey Theotis Ferraris APRN-NP  [facility] ED Geriatric Consult  [facility]  [phone]   Mon-Thur -7:30am-5:30pm

## 2024-08-17 NOTE — ED Notes (Signed)
 Pt given dinner tray.

## 2024-08-17 NOTE — Progress Notes (Signed)
 Pharmacist Note - Vancomycin  Dosing    Consult provided for this 83 y.o. female for indication of SSTI.  Antibiotic regimen(s): Vancomycin  + Cefepime    Patient on vancomycin  PTA? NO     Recent Labs     08/17/24  0710   WBC 14.8*   CREATININE 1.04*   BUN 30*     Frequency of BMP: Daily   Height: 162.6 cm  Weight: 95.6 kg  Est CrCl: 48 ml/min; UO: --- ml/kg/hr  Temp (24hrs), Avg:99.4 F (37.4 C), Min:99.4 F (37.4 C), Max:99.4 F (37.4 C)    Cultures:  8/20 Leg wound-in process  8/20 Blood-NGTD      MRSA Swab ordered (if applicable)? YES    The plan below is expected to result in a target range of AUC/MIC 400-500    Therapy will be initiated with a loading dose of 2500 mg IV x 1 to be followed by a maintenance dose of 1000 mg IV every 24 hours.  Pharmacy to follow patient daily and order levels / make dose adjustments as appropriate.    Fairy Chapel, PharmD    *Vancomycin  has been dosed used Bayesian kinetics software to target an AUC/MIC of 400-600, which provides adequate exposure for an assumed infection due to MRSA with an MIC of 1 or less while reducing the risk of nephrotoxicity as seen with traditional trough based dosing goals.

## 2024-08-18 LAB — CBC WITH AUTO DIFFERENTIAL
Basophils %: 0.5 % (ref 0.0–1.0)
Basophils Absolute: 0.06 K/UL (ref 0.00–0.10)
Eosinophils %: 0.7 % (ref 0.0–7.0)
Eosinophils Absolute: 0.08 K/UL (ref 0.00–0.40)
Hematocrit: 24.5 % — ABNORMAL LOW (ref 35.0–47.0)
Hemoglobin: 7.5 g/dL — ABNORMAL LOW (ref 11.5–16.0)
Immature Granulocytes %: 0.3 % (ref 0.0–0.5)
Immature Granulocytes Absolute: 0.03 K/UL (ref 0.00–0.04)
Lymphocytes %: 14.7 % (ref 12.0–49.0)
Lymphocytes Absolute: 1.75 K/UL (ref 0.80–3.50)
MCH: 23.1 pg — ABNORMAL LOW (ref 26.0–34.0)
MCHC: 30.6 g/dL (ref 30.0–36.5)
MCV: 75.4 FL — ABNORMAL LOW (ref 80.0–99.0)
MPV: 11.6 FL (ref 8.9–12.9)
Monocytes %: 7.6 % (ref 5.0–13.0)
Monocytes Absolute: 0.9 K/UL (ref 0.00–1.00)
Neutrophils %: 76.2 % — ABNORMAL HIGH (ref 32.0–75.0)
Neutrophils Absolute: 9.07 K/UL — ABNORMAL HIGH (ref 1.80–8.00)
Nucleated RBCs: 0 /100{WBCs}
Platelets: 289 K/uL (ref 150–400)
RBC: 3.25 M/uL — ABNORMAL LOW (ref 3.80–5.20)
RDW: 18.7 % — ABNORMAL HIGH (ref 11.5–14.5)
WBC: 11.9 K/uL — ABNORMAL HIGH (ref 3.6–11.0)
nRBC: 0 K/uL (ref 0.00–0.01)

## 2024-08-18 LAB — BASIC METABOLIC PANEL
Anion Gap: 10 mmol/L (ref 2–14)
BUN/Creatinine Ratio: 25 — ABNORMAL HIGH (ref 12–20)
BUN: 21 mg/dL (ref 8–23)
CO2: 27 mmol/L (ref 20–29)
Calcium: 8.7 mg/dL — ABNORMAL LOW (ref 8.8–10.2)
Chloride: 102 mmol/L (ref 98–107)
Creatinine: 0.83 mg/dL (ref 0.60–1.00)
Est, Glom Filt Rate: 71 ml/min/1.73m2 — ABNORMAL LOW (ref >90)
Glucose: 152 mg/dL — ABNORMAL HIGH (ref 65–100)
Potassium: 4.3 mmol/L (ref 3.5–5.1)
Sodium: 139 mmol/L (ref 136–145)

## 2024-08-18 LAB — POCT GLUCOSE
POC Glucose: 177 mg/dL — ABNORMAL HIGH (ref 65–117)
POC Glucose: 164 mg/dL — ABNORMAL HIGH (ref 65–117)
POC Glucose: 160 mg/dL — ABNORMAL HIGH (ref 65–117)
POC Glucose: 203 mg/dL — ABNORMAL HIGH (ref 65–117)

## 2024-08-18 LAB — CULTURE, WOUND (WITH GRAM STAIN): Gram Stain: NONE SEEN

## 2024-08-18 LAB — CULTURE, MRSA, SCREENING

## 2024-08-18 LAB — HEMOGLOBIN A1C
Estimated Avg Glucose: 178 mg/dL
Hemoglobin A1C: 7.8 % — ABNORMAL HIGH (ref 4.0–5.6)

## 2024-08-18 MED ORDER — ENOXAPARIN SODIUM 40 MG/0.4ML IJ SOSY
40 | Freq: Every day | INTRAMUSCULAR | Status: DC
Start: 2024-08-18 — End: 2024-08-23
  Administered 2024-08-18 – 2024-08-23 (×6): 40 mg via SUBCUTANEOUS

## 2024-08-18 MED ORDER — SODIUM CHLORIDE 0.9 % IV SOLN (ADDEASE)
0.9 | Freq: Two times a day (BID) | INTRAVENOUS | Status: AC
Start: 2024-08-18 — End: 2024-08-20
  Administered 2024-08-19 – 2024-08-20 (×3): 2000 mg via INTRAVENOUS

## 2024-08-18 MED ORDER — OXYCODONE HCL 5 MG PO TABS
5 | ORAL | Status: DC | PRN
Start: 2024-08-18 — End: 2024-08-19
  Administered 2024-08-18 – 2024-08-19 (×5): 5 mg via ORAL

## 2024-08-18 MED ORDER — TRAMADOL HCL 50 MG PO TABS
50 | Freq: Four times a day (QID) | ORAL | Status: DC | PRN
Start: 2024-08-18 — End: 2024-08-18
  Administered 2024-08-18: 14:00:00 50 mg via ORAL

## 2024-08-18 MED ORDER — VANCOMYCIN INTERMITTENT DOSING (PLACEHOLDER)
Freq: Once | INTRAVENOUS | Status: AC
Start: 2024-08-18 — End: 2024-08-19
  Administered 2024-08-19: 11:00:00 1

## 2024-08-18 MED ORDER — TRAMADOL HCL 50 MG PO TABS
50 | Freq: Four times a day (QID) | ORAL | Status: DC | PRN
Start: 2024-08-18 — End: 2024-08-22
  Administered 2024-08-18: 21:00:00 50 mg via ORAL

## 2024-08-18 MED FILL — CARVEDILOL 12.5 MG PO TABS: 12.5 mg | ORAL | Qty: 1

## 2024-08-18 MED FILL — PANTOPRAZOLE SODIUM 40 MG PO TBEC: 40 mg | ORAL | Qty: 1

## 2024-08-18 MED FILL — TRAMADOL HCL 50 MG PO TABS: 50 mg | ORAL | Qty: 1

## 2024-08-18 MED FILL — CEFEPIME HCL 2 G IV SOLR: 2 g | INTRAVENOUS | Qty: 2

## 2024-08-18 MED FILL — ACETAMINOPHEN 325 MG PO TABS: 325 mg | ORAL | Qty: 2

## 2024-08-18 MED FILL — HUMALOG 100 UNIT/ML IJ SOLN: 100 [IU]/mL | INTRAMUSCULAR | Qty: 1

## 2024-08-18 MED FILL — VANCOMYCIN HCL 1 G IV SOLR: 1 g | INTRAVENOUS | Qty: 1000

## 2024-08-18 MED FILL — OXYCODONE HCL 5 MG PO TABS: 5 mg | ORAL | Qty: 1

## 2024-08-18 MED FILL — ENOXAPARIN SODIUM 40 MG/0.4ML IJ SOSY: 40 MG/0.4ML | INTRAMUSCULAR | Qty: 0.4

## 2024-08-18 MED FILL — LYRICA 25 MG PO CAPS: 25 mg | ORAL | Qty: 2

## 2024-08-18 MED FILL — PULMICORT 0.25 MG/2ML IN SUSP: 0.25 MG/2ML | RESPIRATORY_TRACT | Qty: 2

## 2024-08-18 MED FILL — PREGABALIN 75 MG PO CAPS: 75 mg | ORAL | Qty: 2

## 2024-08-18 MED FILL — AMLODIPINE BESYLATE 5 MG PO TABS: 5 mg | ORAL | Qty: 1

## 2024-08-18 NOTE — Plan of Care (Signed)
 Problem: Physical Therapy - Adult  Goal: By Discharge: Performs mobility at highest level of function for planned discharge setting.  See evaluation for individualized goals.  Description: FUNCTIONAL STATUS PRIOR TO ADMISSION: Patient was modified independent using a rolling walker for functional mobility.    HOME SUPPORT PRIOR TO ADMISSION: The patient lived alone.    Physical Therapy Goals  Initiated 08/17/2024  1.  Patient will move from supine to sit and sit to supine in bed with moderate assistance within 7 day(s).    2.  Patient will perform sit to stand with moderate assistance within 7 day(s).  3.  Patient will transfer from bed to chair and chair to bed with moderate assistance using the least restrictive device within 7 day(s).  4.  Patient will ambulate with moderate assistance for 25 feet with the least restrictive device within 7 day(s).       Outcome: Progressing   PHYSICAL THERAPY TREATMENT    Patient: Jamie Bryant (83 y.o. female)  Date: 08/18/2024  Diagnosis: Cellulitis [L03.90]  Cellulitis of right lower extremity [L03.115]  Frequent falls [R29.6] Cellulitis      Precautions:              ASSESSMENT:  Patient continues to benefit from skilled PT services and is slowly progressing towards goals. Patient reported RLE pain from wounds, but agreeable to attempt transferring to chair. Pt required increased time and multiple attempts to transfer to side of bed. Pt tolerated sitting on side of bed, noted difficulty pt flexing hips and leaning forward. Attempted 3 sit<>Stand using Camie Ip requiring max A x2. Pt unable to shift weight forward to clear bed and reporting leg pain requiring to return to supine. Anticipate need for increased physical assistance upon discharge as pt significantly below baseline.          PLAN:  Patient continues to benefit from skilled intervention to address the above impairments.  Continue treatment per established plan of care.      Recommendations for mobility with  staff: Recommend that staff assist the patient out of bed only with use of a mechanical lift.    Recommendations for toileting with staff:  recommended toilet device: a Purewick.       Recommendation for discharge: (in order for the patient to meet his/her long term goals):   Moderate intensity short-term skilled physical therapy up to 5x/week    Other factors to consider for discharge: available support system works or is unable to provide adequate supervision and the patient would be alone, patient's current support system is unable to meet their requirements for physical assistance, poor safety awareness, impaired cognition, high risk for falls, and not safe to be alone    IF patient discharges home will need the following DME: continuing to assess with progress       SUBJECTIVE:   Patient stated, It hurts.    OBJECTIVE DATA SUMMARY:   Critical Behavior:  Orientation  Overall Orientation Status: Within Functional Limits  Orientation Level: Oriented X4  Cognition  Overall Cognitive Status: Exceptions  Arousal/Alertness: Appears intact  Following Commands: Follows one step commands with repetition  Attention Span: Attends with cues to redirect  Safety Judgement: Impaired  Problem Solving: Impaired  Initiation: Requires cues for some  Sequencing: Requires cues for some    Functional Mobility Training:  Bed Mobility:  Bed Mobility Training  Bed Mobility Training: Yes  Supine to Sit: Substantial/Maximal assistance;2 Person assistance  Sit to Supine: Dependent/Total  Transfers:  Art therapist: Yes  Sit to Stand:  (unable to come to stand)  Balance:  Balance  Sitting: Impaired  Sitting - Static: Fair (occasional)  Sitting - Dynamic: Fair (occasional)  Standing: Impaired       Pain Rating:  Pt did not quantify R>L leg pain  Pain Intervention(s):   nursing notified, rest, and elevation    Activity Tolerance:   Fair  and requires rest breaks    After treatment:   Patient left in no apparent  distress in bed, Call bell within reach, and Bed/ chair alarm activated      COMMUNICATION/EDUCATION:   The patient's plan of care was discussed with: occupational therapist and registered nurse    Patient Education  Education Given To: Patient  Education Provided: Role of Therapy;Plan of Care;Fall Prevention Strategies;Mobility Training;Equipment  Education Method: Demonstration;Verbal  Barriers to Learning: Readiness to Learn  Education Outcome: Verbalized understanding;Continued education needed      Lemarcus Baggerly, PT  Minutes: 25

## 2024-08-18 NOTE — Progress Notes (Signed)
 Renal Dosing/Monitoring  Medication: Cefepime    Current regimen:  2 grams IV every 24 hr  Recent Labs     08/17/24  0710 08/18/24  0310   CREATININE 1.04* 0.83   BUN 30* 21     Body mass index is 36.18 kg/m.  Estimated CrCl:  61 ml/min  Plan: Change to 2 grams IV every 12 hoursper [facility] P&T Committee Protocol with respect to renal function.  Pharmacy will continue to monitor patient daily and will make dosage adjustments based upon changing renal function.

## 2024-08-18 NOTE — Progress Notes (Addendum)
 [facility]                                                                               Hospitalist Progress Note  Deitra LELON Sharps, MD  Answering service: [phone] OR [phone] from in house phone        Date of Service:  08/18/2024  NAME:  Jamie Bryant  DOB:  Oct 30, 1941  MRN:  [id number]       Admission Summary:   CELICA KOTOWSKI is a 83 y.o. female with a pmhx DM II, gastroparesis, CKD III, past stroke, HTN, and dyslipidemia who presents via EMS for fall, and is being admitted for cellulitis.     In the ED, she was tachycardic to 101, and hypoxic to 89% on RA.  Other VSS.  Labs showed WBC 14.8, microcytic anemia with Hgb 8.5 (b/l 8-9), BUN 30, creatinine 1.04, CK 898. CXR and XR lumbar spine without acute abnormalities.     In the ED, she received robaxin, vancomycin , and tylenol .      Interval history / Subjective:   C/o b/l LE pain  NAD  AFVSS on RA     Assessment & Plan:     Sepsis 2/2 cellulitis, b/l LE wounds  -continue abx tx with vancomycin  and cefepime   -blood cultures NGTD, wound culture with GNR  -She was followed in wound care until March 2025 when she was lost to follow-up  -wound care     #Fall  -Endorses mechanical fall at her facility.  Denies loss of consciousness, or lightheadedness.  Lives in independent living section of her facility.  She has a son in the area who is currently out of town, but will be back over the weekend  - CT head, CT C-spine nothing acute  - PT/OT rec SNF     #DM2, A1c 7.8  #Gastroparesis  #Diabetic neuropathy  - hold home DDP 4   -ISS     #Hypertension  - Continue home carvedilol  and Norvasc      #Dyslipidemia     #GERD  - PPI     #dementia  -supportive care      Code status: Full Code No additional code details  Prophylaxis: Lovenox  ppx  Care Plan discussed with: Patient/Family and Nurse  Anticipated Disposition: SNF  Anticipated Discharge: >48h  Admission Status: Inpatient  Cardiac monitoring: None  Central Line:            Social  Drivers of Health     Tobacco Use: Low Risk  (06/07/2024)    Patient History     Smoking Tobacco Use: Never     Smokeless Tobacco Use: Never     Passive Exposure: Never   Alcohol Use: Not At Risk (11/17/2023)    AUDIT-C     Frequency of Alcohol Consumption: Never     Average Number of Drinks: Patient does not drink     Frequency of Binge Drinking: Never   Financial Resource Strain: Medium Risk (11/17/2023)    Overall Financial Resource Strain (CARDIA)     Difficulty of Paying Living Expenses: Somewhat hard   Food Insecurity: No Food Insecurity (03/07/2024)    Hunger  Vital Sign     Worried About Programme researcher, broadcasting/film/video in the Last Year: Never true     Ran Out of Food in the Last Year: Never true   Transportation Needs: No Transportation Needs (03/07/2024)    PRAPARE - Therapist, art (Medical): No     Lack of Transportation (Non-Medical): No   Physical Activity: Insufficiently Active (11/17/2023)    Exercise Vital Sign     Days of Exercise per Week: 2 days     Minutes of Exercise per Session: 30 min   Stress: No Stress Concern Present (05/26/2023)    [employer] of Occupational Health - Occupational Stress Questionnaire     Feeling of Stress : Only a little   Social Connections: Unknown (05/26/2023)    Social Connection and Isolation Panel     Frequency of Communication with Friends and Family: Once a week     Frequency of Social Gatherings with Friends and Family: Once a week     Attends Religious Services: Never     Database administrator or Organizations: No     Attends Banker Meetings: Never     Marital Status: Not on file   Recent Concern: Social Connections - Socially Isolated (05/26/2023)    Social Connection and Isolation Panel     Frequency of Communication with Friends and Family: Once a week     Frequency of Social Gatherings with Friends and Family: Once a week     Attends Religious Services: Never     Database administrator or Organizations: No     Attends Tax inspector Meetings: Never     Marital Status: Divorced   Catering manager Violence: Not At Risk (05/26/2023)    Humiliation, Afraid, Rape, and Kick questionnaire     Fear of Current or Ex-Partner: No     Emotionally Abused: No     Physically Abused: No     Sexually Abused: No   Depression: Not at risk (06/07/2024)    PHQ-2     PHQ-2 Score: 0   Housing Stability: Low Risk  (03/07/2024)    Housing Stability Vital Sign     Unable to Pay for Housing in the Last Year: No     Number of Times Moved in the Last Year: 0     Homeless in the Last Year: No   Interpersonal Safety: Not At Risk (08/17/2024)    Interpersonal Safety Domain Source: IP Abuse Screening     Physical abuse: Denies     Verbal abuse: Denies     Emotional abuse: Denies     Financial abuse: Denies     Sexual abuse: Denies   Utilities: Not At Risk (03/07/2024)    [facility] Utilities     Threatened with loss of utilities: No       Review of Systems:   Pertinent items are noted in HPI.    Vital Signs:    Last 24hrs VS reviewed since prior progress note. Most recent are:  Vitals:    08/18/24 1200   BP:    Pulse: 79   Resp:    Temp:    SpO2:          Intake/Output Summary (Last 24 hours) at 08/18/2024 1316  Last data filed at 08/17/2024 1744  Gross per 24 hour   Intake --   Output 700 ml   Net -700 ml      Physical  Examination:   I had a face to face encounter with this patient and independently examined them on 08/18/2024 as outlined below:    General: Alert, no acute distress    Resp:  CTA bilaterally, no wheezing or rales. No accessory muscle use  CV:  RRR, 1/6 SM  GI:  Soft, Non distended, Non tender  Neurologic:  Alert and oriented, MAE  Extremities: B/l LE wrapped  Psych:  Not anxious or agitated    Data Review:   I have personally and independently reviewed all pertinent labs, diagnostic studies, imaging, and have provided independent interpretation of the same.    Labs:     Recent Labs     08/17/24  0710 08/18/24  0310   WBC 14.8* 11.9*   HGB 8.5* 7.5*   HCT 27.9*  24.5*   PLT 368 289     Recent Labs     08/17/24  0710 08/18/24  0310   NA 140 139   K 4.1 4.3   CL 99 102   CO2 30* 27   BUN 30* 21     Recent Labs     08/17/24  0710   ALT 13   GLOB 4.0     No results for input(s): INR, APTT in the last 72 hours.    Invalid input(s): PTP   No results for input(s): TIBC in the last 72 hours.    Invalid input(s): FE, PSAT, FERR   No results found for: RBCF   No results for input(s): PH, PCO2, PO2 in the last 72 hours.  No results for input(s): CPK in the last 72 hours.    Invalid input(s): CPKMB, CKNDX, TROIQ  Lab Results   Component Value Date/Time    CHOL 150 06/09/2023 02:50 PM    HDL 52 06/09/2023 02:50 PM    LDL 71.2 06/09/2023 02:50 PM    LDL 119.4 06/10/2022 04:36 PM     No results found for: GLUCPOC  Lab Results   Component Value Date/Time    APPEARANCE CLEAR 08/17/2024 08:18 AM    COLORU YELLOW/STRAW 08/17/2024 08:18 AM    NITRU Negative 08/17/2024 08:18 AM    GLUCOSEU Negative 08/17/2024 08:18 AM    GLUCOSEU Negative 06/10/2022 04:36 PM    KETUA Negative 08/17/2024 08:18 AM    UROBILINOGEN 0.2 08/17/2024 08:18 AM    BILIRUBINUR Negative 08/17/2024 08:18 AM       Notes reviewed from all clinical/nonclinical/nursing services involved in patient's clinical care. Care coordination discussions were held with appropriate clinical/nonclinical/ nursing providers based on care coordination needs.     Patients current active Medications were reviewed, considered, added and adjusted based on the clinical condition today.      Medications Reviewed:     Current Facility-Administered Medications   Medication Dose Route Frequency    oxyCODONE (ROXICODONE) immediate release tablet 5 mg  5 mg Oral Q4H PRN    traMADol (ULTRAM) tablet 50 mg  50 mg Oral Q6H PRN    sodium chloride  flush 0.9 % injection 5-40 mL  5-40 mL IntraVENous 2 times per day    sodium chloride  flush 0.9 % injection 5-40 mL  5-40 mL IntraVENous PRN    0.9 % sodium chloride  infusion    IntraVENous PRN    potassium chloride (KLOR-CON) extended release tablet 40 mEq  40 mEq Oral PRN    Or    potassium bicarb-citric acid  (EFFER-K ) effervescent tablet 40 mEq  40 mEq Oral PRN    Or  potassium chloride 10 mEq/100 mL IVPB (Peripheral Line)  10 mEq IntraVENous PRN    magnesium sulfate 2000 mg in 50 mL IVPB premix  2,000 mg IntraVENous PRN    ondansetron  (ZOFRAN -ODT) disintegrating tablet 4 mg  4 mg Oral Q8H PRN    Or    ondansetron  (ZOFRAN ) injection 4 mg  4 mg IntraVENous Q6H PRN    polyethylene glycol (GLYCOLAX ) packet 17 g  17 g Oral Daily PRN    acetaminophen  (TYLENOL ) tablet 650 mg  650 mg Oral Q6H PRN    Or    acetaminophen  (TYLENOL ) suppository 650 mg  650 mg Rectal Q6H PRN    budesonide  (PULMICORT ) nebulizer suspension 250 mcg  0.25 mg Nebulization BID    pantoprazole  (PROTONIX ) tablet 40 mg  40 mg Oral QAM AC    pregabalin  (LYRICA ) capsule 150 mg  150 mg Oral BID    carvedilol  (COREG ) tablet 12.5 mg  12.5 mg Oral BID    amLODIPine  (NORVASC ) tablet 5 mg  5 mg Oral Daily    vancomycin  (VANCOCIN ) 1,000 mg in sodium chloride  0.9 % 250 mL IVPB (Vial2Bag)  1,000 mg IntraVENous Q24H    Vancomycin  - Pharmacy Dosing   Other RX Placeholder    ceFEPIme  (MAXIPIME ) 2,000 mg in sodium chloride  0.9 % 100 mL IVPB (addEASE)  2,000 mg IntraVENous Q24H    glucose chewable tablet 16 g  4 tablet Oral PRN    dextrose  bolus 10% 125 mL  125 mL IntraVENous PRN    Or    dextrose  bolus 10% 250 mL  250 mL IntraVENous PRN    glucagon  injection 1 mg  1 mg SubCUTAneous PRN    dextrose  10 % infusion   IntraVENous Continuous PRN    insulin  lispro (HUMALOG ,ADMELOG ) injection vial 0-4 Units  0-4 Units SubCUTAneous 4x Daily AC & HS     ______________________________________________________________________  EXPECTED LENGTH OF STAY: 2  ACTUAL LENGTH OF STAY:          1                 Deitra LELON Sharps, MD

## 2024-08-18 NOTE — Plan of Care (Signed)
 Problem: Occupational Therapy - Adult  Goal: By Discharge: Performs self-care activities at highest level of function for planned discharge setting.  See evaluation for individualized goals.  Description: FUNCTIONAL STATUS PRIOR TO ADMISSION:  Patient was ambulatory using RW   , Prior Level of Assist for ADLs: Independent,  ,  ,  ,  ,  , Prior Level of Assist for Homemaking: Independent,  , Prior Level of Assist for Transfers: Independent, Active Driver: No     HOME SUPPORT: Patient lived alone at [facility] and reports independence with ADLS/IADLS .    Occupational Therapy Goals:  Initiated 08/17/2024  1.  Patient will perform grooming with Minimal Assist in sitting within 7 day(s).  2.  Patient will perform bathing with Maximal Assist within 7 day(s).  3.  Patient will perform lower body dressing with Maximal Assist within 7 day(s).  4.  Patient will perform toilet transfers with Maximal Assist  within 7 day(s).  5.  Patient will perform all aspects of toileting with Moderate Assist within 7 day(s).  6.  Patient will participate in upper extremity therapeutic exercise/activities with Moderate Assist for 5 minutes within 7 day(s).    7.  Patient will utilize energy conservation techniques during functional activities with verbal cues within 7 day(s).   Outcome: Not Progressing     OCCUPATIONAL THERAPY TREATMENT  Patient: Jamie Bryant (83 y.o. female)  Date: 08/18/2024  Primary Diagnosis: Cellulitis [L03.90]  Cellulitis of right lower extremity [L03.115]  Frequent falls [R29.6]       Precautions:                  Chart, occupational therapy assessment, plan of care, and goals were reviewed.    ASSESSMENT  Patient continues to benefit from skilled OT services and is not progressing towards goals this session.  Pt cleared for therapy by nursing and received supine in bed agreeable to therapy with encouragement. Pt received pain medication prior to session, however, reporting 15/10 pain level at RLE in addition  to back pain. Max Ax2 to sit EOB with good static seated balance with time. SaraStedy utilized for attempted sit<>stand, however, pt unable to put weight through BLE or pull with BUE. Returned to bed with Total A. Gown changed with mod A and cues for pt participation. Pt would continue to benefit from therapy in acute care setting.         PLAN :  Patient continues to benefit from skilled intervention to address the above impairments.  Continue treatment per established plan of care to address goals.    Recommendations for mobility with staff: Recommend that staff assists the patient to meet their mobility and care needs at the bed level until progress has been made with therapy.    Recommendations for toileting with staff:  recommended toilet device: a bed pan and a Purewick.       Recommend next OT session: attempt SaraStedy to chair with better pain control     Recommendation for discharge: (in order for the patient to meet his/her long term goals):   Moderate intensity short-term skilled occupational therapy up to 5x/week    Other factors to consider for discharge: lives alone, high risk for falls, and concern for safely navigating or managing the home environment    IF patient discharges home will need the following DME: continuing to assess with progress       SUBJECTIVE:   Patient stated "do I just push this button if I  want to get out of bed later?"    OBJECTIVE DATA SUMMARY:   Cognitive/Behavioral Status:  Orientation  Overall Orientation Status: Within Functional Limits  Orientation Level: Oriented X4  Cognition  Overall Cognitive Status: Exceptions  Arousal/Alertness: Appears intact  Following Commands: Follows one step commands with repetition  Attention Span: Attends with cues to redirect  Safety Judgement: Impaired  Problem Solving: Impaired  Initiation: Requires cues for some  Sequencing: Requires cues for some    Functional Mobility and Transfers for ADLs:  Bed Mobility:  Bed Mobility Training  Bed  Mobility Training: Yes  Supine to Sit: Substantial/Maximal assistance;2 Person assistance  Sit to Supine: Dependent/Total     Transfers:   Transfer Training  Transfer Training: Yes  Sit to Stand:  (unable to come to stand)      Balance:     Balance  Sitting: Impaired  Sitting - Static: Fair (occasional)  Sitting - Dynamic: Fair (occasional)  Standing: Impaired    ADL Intervention:     UE Dressing: Moderate assistance  UE Dressing Skilled Clinical Factors: to doff and don gown         Product Used : Soap and water     Pain Rating:  15/10    Pain Intervention(s):   nursing notified and addressing, patient medicated for pain prior to session, rest, and repositioning      Activity Tolerance:   Poor  Please refer to the flowsheet for vital signs taken during this treatment.    After treatment:   Patient left in no apparent distress in bed, Call bell within reach, Bed/ chair alarm activated, Side rails x3, and Heels elevated for pressure relief    COMMUNICATION/EDUCATION:   The patient's plan of care was discussed with: physical therapist and registered nursea    Patient Education  Education Given To: Patient  Education Provided: Role of Therapy;Plan of Microbiologist  Education Method: Verbal  Barriers to Learning: Readiness to Learn  Education Outcome: Continued education needed    Thank you for this referral.  Powell Nedra Perches, OT  Minutes: 31

## 2024-08-18 NOTE — Wound Image (Signed)
 Discussed with RN by phone regarding treatment for leg wounds until we are able to see tomorrow.  Recommended cleaning with Vashe and applying AG foam based on photos taken in ER.            Will see tomorrow.    Mliss Ferrari, CWOCN

## 2024-08-19 LAB — POCT GLUCOSE
POC Glucose: 150 mg/dL — ABNORMAL HIGH (ref 65–117)
POC Glucose: 161 mg/dL — ABNORMAL HIGH (ref 65–117)
POC Glucose: 164 mg/dL — ABNORMAL HIGH (ref 65–117)
POC Glucose: 189 mg/dL — ABNORMAL HIGH (ref 65–117)
POC Glucose: 191 mg/dL — ABNORMAL HIGH (ref 65–117)

## 2024-08-19 LAB — C-REACTIVE PROTEIN: CRP: 21.5 mg/dL — ABNORMAL HIGH (ref 0–0.5)

## 2024-08-19 MED ORDER — SENNA-DOCUSATE SODIUM 8.6-50 MG PO TABS
8.6-50 | Freq: Every day | ORAL | Status: DC
Start: 2024-08-19 — End: 2024-08-23
  Administered 2024-08-20 – 2024-08-23 (×4): 2 via ORAL

## 2024-08-19 MED ORDER — SODIUM CHLORIDE 0.9 % IV SOLN
0.9 | INTRAVENOUS | Status: DC
Start: 2024-08-19 — End: 2024-08-20
  Administered 2024-08-19: 19:00:00 1250 mg via INTRAVENOUS

## 2024-08-19 MED ORDER — HYDROMORPHONE 0.5MG/0.5ML IJ SOLN
1 | Status: DC | PRN
Start: 2024-08-19 — End: 2024-08-23
  Administered 2024-08-19 – 2024-08-23 (×2): 0.5 mg via INTRAVENOUS

## 2024-08-19 MED ORDER — KETOROLAC TROMETHAMINE 30 MG/ML IJ SOLN
30 | Freq: Three times a day (TID) | INTRAMUSCULAR | Status: DC
Start: 2024-08-19 — End: 2024-08-20
  Administered 2024-08-19 – 2024-08-20 (×3): 15 mg via INTRAVENOUS

## 2024-08-19 MED ORDER — OXYCODONE HCL 5 MG PO TABS
5 | ORAL | Status: DC | PRN
Start: 2024-08-19 — End: 2024-08-23
  Administered 2024-08-21 – 2024-08-22 (×3): 5 mg via ORAL

## 2024-08-19 MED ORDER — LINACLOTIDE 290 MCG PO CAPS
290 | Freq: Every day | ORAL | Status: DC
Start: 2024-08-19 — End: 2024-08-20

## 2024-08-19 MED ORDER — NALOXONE HCL 0.4 MG/ML IJ SOLN
0.4 | INTRAMUSCULAR | Status: DC | PRN
Start: 2024-08-19 — End: 2024-08-23

## 2024-08-19 MED ORDER — INSULIN GLARGINE 100 UNIT/ML SC SOLN
100 | Freq: Every evening | SUBCUTANEOUS | Status: DC
Start: 2024-08-19 — End: 2024-08-23
  Administered 2024-08-20 – 2024-08-23 (×4): 10 [IU] via SUBCUTANEOUS

## 2024-08-19 MED ORDER — POLYETHYLENE GLYCOL 3350 17 G PO PACK
17 | Freq: Every day | ORAL | Status: DC
Start: 2024-08-19 — End: 2024-08-23
  Administered 2024-08-22 – 2024-08-23 (×2): 17 g via ORAL

## 2024-08-19 MED ORDER — OXYCODONE HCL 5 MG PO TABS
5 | ORAL | Status: DC | PRN
Start: 2024-08-19 — End: 2024-08-23
  Administered 2024-08-21: 22:00:00 10 mg via ORAL

## 2024-08-19 MED FILL — HYDROMORPHONE HCL 1 MG/ML IJ SOLN: 1 mg/mL | INTRAMUSCULAR | Qty: 0.5

## 2024-08-19 MED FILL — CEFEPIME HCL 2 G IV SOLR: 2 g | INTRAVENOUS | Qty: 2

## 2024-08-19 MED FILL — HUMALOG 100 UNIT/ML IJ SOLN: 100 [IU]/mL | INTRAMUSCULAR | Qty: 1

## 2024-08-19 MED FILL — PREGABALIN 75 MG PO CAPS: 75 mg | ORAL | Qty: 2

## 2024-08-19 MED FILL — PULMICORT 0.25 MG/2ML IN SUSP: 0.25 MG/2ML | RESPIRATORY_TRACT | Qty: 2

## 2024-08-19 MED FILL — AMLODIPINE BESYLATE 5 MG PO TABS: 5 mg | ORAL | Qty: 1

## 2024-08-19 MED FILL — PANTOPRAZOLE SODIUM 40 MG PO TBEC: 40 mg | ORAL | Qty: 1

## 2024-08-19 MED FILL — OXYCODONE HCL 5 MG PO TABS: 5 mg | ORAL | Qty: 1

## 2024-08-19 MED FILL — BUDESONIDE 0.25 MG/2ML IN SUSP: 0.25 MG/2ML | RESPIRATORY_TRACT | Qty: 2

## 2024-08-19 MED FILL — ENOXAPARIN SODIUM 40 MG/0.4ML IJ SOSY: 40 MG/0.4ML | INTRAMUSCULAR | Qty: 0.4

## 2024-08-19 MED FILL — CARVEDILOL 12.5 MG PO TABS: 12.5 mg | ORAL | Qty: 1

## 2024-08-19 MED FILL — VANCOMYCIN HCL 1.25 G IV SOLR: 1.25 g | INTRAVENOUS | Qty: 1250

## 2024-08-19 MED FILL — KETOROLAC TROMETHAMINE 30 MG/ML IJ SOLN: 30 mg/mL | INTRAMUSCULAR | Qty: 1

## 2024-08-19 NOTE — Progress Notes (Signed)
 Spiritual Care Partner Volunteer visited patient at [facility] in [facility] 6E MED ONCOLOGY on 08/19/2024   Documented by:  Ubaldo Louder, MDIV, [facility]

## 2024-08-19 NOTE — Progress Notes (Signed)
  [facility]                                                                               Hospitalist Progress Note  Jamie LELON Sharps, MD  Answering service: [phone] OR [phone] from in house phone        Date of Service:  08/19/2024  NAME:  Jamie Bryant  DOB:  1941-12-17  MRN:  [id number]       Admission Summary:   Jamie Bryant is a 83 y.o. female with a pmhx DM II, gastroparesis, CKD III, past stroke, HTN, and dyslipidemia who presents via EMS for fall, and is being admitted for cellulitis.     In the ED, she was tachycardic to 101, and hypoxic to 89% on RA.  Other VSS.  Labs showed WBC 14.8, microcytic anemia with Hgb 8.5 (b/l 8-9), BUN 30, creatinine 1.04, CK 898. CXR and XR lumbar spine without acute abnormalities.     In the ED, she received robaxin , vancomycin , and tylenol .      Interval history / Subjective:   C/o ongoing b/l LE wound pain  NAD  AFVSS on RA     Assessment & Plan:     Sepsis 2/2 cellulitis, b/l LE wounds  -continue abx tx with vancomycin  and cefepime   -blood cultures NGTD, wound culture with GNR  -She was followed in wound care until March 2025 when she was lost to follow-up  -wound care  -pain mgmt     #Fall  -Endorses mechanical fall at her facility.  Denies loss of consciousness, or lightheadedness.  Lives in independent living section of her facility.  She has a son in the area who is currently out of town, but will be back over the weekend  - CT head, CT C-spine nothing acute  - PT/OT rec SNF     #DM2, A1c 7.8  #Gastroparesis  #Diabetic neuropathy  - hold home DDP 4   -ISS     #Hypertension  - Continue home carvedilol  and Norvasc      #Dyslipidemia     #GERD  - PPI     #dementia  -supportive care      Code status: Full Code No additional code details  Prophylaxis: Lovenox  ppx  Care Plan discussed with: Patient/Family and Nurse  Anticipated Disposition: SNF  Anticipated Discharge: >48h  Admission Status: Inpatient  Cardiac monitoring: None  Central  Line:            Social Drivers of Health     Tobacco Use: Low Risk  (06/07/2024)    Patient History     Smoking Tobacco Use: Never     Smokeless Tobacco Use: Never     Passive Exposure: Never   Alcohol Use: Not At Risk (11/17/2023)    AUDIT-C     Frequency of Alcohol Consumption: Never     Average Number of Drinks: Patient does not drink     Frequency of Binge Drinking: Never   Financial Resource Strain: Medium Risk (11/17/2023)    Overall Financial Resource Strain (CARDIA)     Difficulty of Paying Living Expenses: Somewhat hard   Food Insecurity: No Food Insecurity (  03/07/2024)    Hunger Vital Sign     Worried About Running Out of Food in the Last Year: Never true     Ran Out of Food in the Last Year: Never true   Transportation Needs: No Transportation Needs (03/07/2024)    PRAPARE - Therapist, art (Medical): No     Lack of Transportation (Non-Medical): No   Physical Activity: Insufficiently Active (11/17/2023)    Exercise Vital Sign     Days of Exercise per Week: 2 days     Minutes of Exercise per Session: 30 min   Stress: No Stress Concern Present (05/26/2023)    [employer] of Occupational Health - Occupational Stress Questionnaire     Feeling of Stress : Only a little   Social Connections: Unknown (05/26/2023)    Social Connection and Isolation Panel     Frequency of Communication with Friends and Family: Once a week     Frequency of Social Gatherings with Friends and Family: Once a week     Attends Religious Services: Never     Database administrator or Organizations: No     Attends Banker Meetings: Never     Marital Status: Not on file   Recent Concern: Social Connections - Socially Isolated (05/26/2023)    Social Connection and Isolation Panel     Frequency of Communication with Friends and Family: Once a week     Frequency of Social Gatherings with Friends and Family: Once a week     Attends Religious Services: Never     Database administrator or Organizations: No      Attends Banker Meetings: Never     Marital Status: Divorced   Catering manager Violence: Not At Risk (05/26/2023)    Humiliation, Afraid, Rape, and Kick questionnaire     Fear of Current or Ex-Partner: No     Emotionally Abused: No     Physically Abused: No     Sexually Abused: No   Depression: Not at risk (06/07/2024)    PHQ-2     PHQ-2 Score: 0   Housing Stability: Low Risk  (03/07/2024)    Housing Stability Vital Sign     Unable to Pay for Housing in the Last Year: No     Number of Times Moved in the Last Year: 0     Homeless in the Last Year: No   Interpersonal Safety: Not At Risk (08/17/2024)    Interpersonal Safety Domain Source: IP Abuse Screening     Physical abuse: Denies     Verbal abuse: Denies     Emotional abuse: Denies     Financial abuse: Denies     Sexual abuse: Denies   Utilities: Not At Risk (03/07/2024)    [facility] Utilities     Threatened with loss of utilities: No       Review of Systems:   Pertinent items are noted in HPI.    Vital Signs:    Last 24hrs VS reviewed since prior progress note. Most recent are:  Vitals:    08/19/24 0808   BP:    Pulse: 98   Resp: 20   Temp:    SpO2: 93%         Intake/Output Summary (Last 24 hours) at 08/19/2024 1210  Last data filed at 08/19/2024 0649  Gross per 24 hour   Intake --   Output 700 ml   Net -700 ml  Physical Examination:   I had a face to face encounter with this patient and independently examined them on 08/19/2024 as outlined below:    General: Alert, mild distress d/t pain  Resp:  CTA bilaterally, No accessory muscle use  CV:  RRR, 1/6 SM  GI:  Soft, Non distended, Non tender  Neurologic:  Alert and oriented x3, MAE  Extremities: B/l LE wrapped  Psych:  Not anxious or agitated    Data Review:   I have personally and independently reviewed all pertinent labs, diagnostic studies, imaging, and have provided independent interpretation of the same.    Labs:     Recent Labs     08/18/24  0310 08/19/24  0630   WBC 11.9* 12.1*   HGB 7.5* 8.1*    HCT 24.5* 26.8*   PLT 289 315     Recent Labs     08/17/24  0710 08/18/24  0310 08/19/24  0630   NA 140 139 134*   K 4.1 4.3 4.8   CL 99 102 98   CO2 30* 27 24   BUN 30* 21 19     Recent Labs     08/17/24  0710   ALT 13   GLOB 4.0     No results for input(s): INR, APTT in the last 72 hours.    Invalid input(s): PTP   No results for input(s): TIBC in the last 72 hours.    Invalid input(s): FE, PSAT, FERR   No results found for: RBCF   No results for input(s): PH, PCO2, PO2 in the last 72 hours.  No results for input(s): CPK in the last 72 hours.    Invalid input(s): CPKMB, CKNDX, TROIQ  Lab Results   Component Value Date/Time    CHOL 150 06/09/2023 02:50 PM    HDL 52 06/09/2023 02:50 PM    LDL 71.2 06/09/2023 02:50 PM    LDL 119.4 06/10/2022 04:36 PM     No results found for: GLUCPOC  Lab Results   Component Value Date/Time    APPEARANCE CLEAR 08/17/2024 08:18 AM    COLORU YELLOW/STRAW 08/17/2024 08:18 AM    NITRU Negative 08/17/2024 08:18 AM    GLUCOSEU Negative 08/17/2024 08:18 AM    GLUCOSEU Negative 06/10/2022 04:36 PM    KETUA Negative 08/17/2024 08:18 AM    UROBILINOGEN 0.2 08/17/2024 08:18 AM    BILIRUBINUR Negative 08/17/2024 08:18 AM       Notes reviewed from all clinical/nonclinical/nursing services involved in patient's clinical care. Care coordination discussions were held with appropriate clinical/nonclinical/ nursing providers based on care coordination needs.     Patients current active Medications were reviewed, considered, added and adjusted based on the clinical condition today.      Medications Reviewed:     Current Facility-Administered Medications   Medication Dose Route Frequency    vancomycin  (VANCOCIN ) 1,250 mg in sodium chloride  0.9 % 250 mL IVPB (Vial2Bag)  1,250 mg IntraVENous Q24H    HYDROmorphone  (DILAUDID ) injection 0.5 mg  0.5 mg IntraVENous Q3H PRN    oxyCODONE  (ROXICODONE ) immediate release tablet 5 mg  5 mg Oral Q4H PRN    traMADol  (ULTRAM ) tablet 50  mg  50 mg Oral Q6H PRN    enoxaparin  (LOVENOX ) injection 40 mg  40 mg SubCUTAneous Daily    ceFEPIme  (MAXIPIME ) 2,000 mg in sodium chloride  0.9 % 100 mL IVPB (addEASE)  2,000 mg IntraVENous Q12H    sodium chloride  flush 0.9 % injection 5-40 mL  5-40  mL IntraVENous 2 times per day    sodium chloride  flush 0.9 % injection 5-40 mL  5-40 mL IntraVENous PRN    0.9 % sodium chloride  infusion   IntraVENous PRN    magnesium  sulfate 2000 mg in 50 mL IVPB premix  2,000 mg IntraVENous PRN    ondansetron  (ZOFRAN -ODT) disintegrating tablet 4 mg  4 mg Oral Q8H PRN    Or    ondansetron  (ZOFRAN ) injection 4 mg  4 mg IntraVENous Q6H PRN    polyethylene glycol (GLYCOLAX ) packet 17 g  17 g Oral Daily PRN    acetaminophen  (TYLENOL ) tablet 650 mg  650 mg Oral Q6H PRN    Or    acetaminophen  (TYLENOL ) suppository 650 mg  650 mg Rectal Q6H PRN    budesonide  (PULMICORT ) nebulizer suspension 250 mcg  0.25 mg Nebulization BID    pantoprazole  (PROTONIX ) tablet 40 mg  40 mg Oral QAM AC    pregabalin  (LYRICA ) capsule 150 mg  150 mg Oral BID    carvedilol  (COREG ) tablet 12.5 mg  12.5 mg Oral BID    amLODIPine  (NORVASC ) tablet 5 mg  5 mg Oral Daily    Vancomycin  - Pharmacy Dosing   Other RX Placeholder    glucose chewable tablet 16 g  4 tablet Oral PRN    dextrose  bolus 10% 125 mL  125 mL IntraVENous PRN    Or    dextrose  bolus 10% 250 mL  250 mL IntraVENous PRN    glucagon  injection 1 mg  1 mg SubCUTAneous PRN    dextrose  10 % infusion   IntraVENous Continuous PRN    insulin  lispro (HUMALOG ,ADMELOG ) injection vial 0-4 Units  0-4 Units SubCUTAneous 4x Daily AC & HS     ______________________________________________________________________  EXPECTED LENGTH OF STAY: 5  ACTUAL LENGTH OF STAY:          2                 Jamie LELON Sharps, MD

## 2024-08-19 NOTE — Progress Notes (Signed)
 Pharmacist Note - Vancomycin  Dosing  Therapy day 3  Indication: cellulitis  Current regimen: 1g IV q 24hrs    Recent Labs     08/17/24  0710 08/18/24  0310 08/19/24  0630   WBC 14.8* 11.9* 12.1*   CREATININE 1.04* 0.83 0.90   BUN 30* 21 19       A random vancomycin  level of 9.3 mcg/mL was obtained and from this level, the patient's AUC24 is calculated to be 323 with the current regimen.     Goal target range AUC/MIC 400-600      Plan: Change to 1250mg  IV q 24hrs. Pharmacy will continue to monitor this patient daily for changes in clinical status and renal function.    *Random vancomycin  levels are used to calculate AUC/MIC, this level should not be interpreted as a trough. Vancomycin  has been dosed using Bayesian kinetics software to target an AUC24:MIC of 400-600, which provides adequate exposure for as assumed infection due to MRSA with an MIC of 1 or less while reducing the risk of nephrotoxicity as seen with traditional trough based dosing goals.

## 2024-08-19 NOTE — Plan of Care (Signed)
 Problem: Chronic Conditions and Co-morbidities  Goal: Patient's chronic conditions and co-morbidity symptoms are monitored and maintained or improved  Outcome: Progressing     Problem: Skin/Tissue Integrity  Goal: Skin integrity remains intact  Description: 1.  Monitor for areas of redness and/or skin breakdown  2.  Assess vascular access sites hourly  3.  Every 4-6 hours minimum:  Change oxygen saturation probe site  4.  Every 4-6 hours:  If on nasal continuous positive airway pressure, respiratory therapy assess nares and determine need for appliance change or resting period  Outcome: Progressing     Problem: Safety - Adult  Goal: Free from fall injury  Outcome: Progressing     Problem: Pain  Goal: Verbalizes/displays adequate comfort level or baseline comfort level  Outcome: Not Progressing     Problem: Pain  Goal: Verbalizes/displays adequate comfort level or baseline comfort level  Outcome: Not Progressing

## 2024-08-19 NOTE — Care Coordination-Inpatient (Signed)
 Transition of Care Plan: anticipate d/c to SNF, pending family choices:    Pt will require 3 MN IP Stay for SNF placement;    BLS Transport    RUR: 19%  Prior Level of Functioning: modified independent with RW  Disposition: SNF  EDD: 8/25  If SNF or IPR: Date FOC offered: 8/22  Date FOC received:   Accepting facility:   Follow up appointments: PCP  DME needed: defer to SNF  Transportation at discharge: BLS  IM/IMM Medicare/Tricare letter given: 8/20  Is patient a Veteran and connected with VA? No  Caregiver Contact: Daughter, Bernice Ellen, [phone]  Discharge Caregiver contacted prior to discharge?   Care Conference needed?   Barriers to discharge: medical stability  1500-CM reviewed pt chart and noted CM consulted for Snf. CM met with pt and contacted pt's son, who asked that CM contact pt's dtr-pt also requested this. CM left VM for pt's dtr, Erika to discuss SNF options and CM will follow.  Slater CHARLENA Perna RN BSN CCM

## 2024-08-19 NOTE — Progress Notes (Signed)
 OCCUPATIONAL THERAPY: OT attempted to see patient for OT. Patient in room with nurse, moaning. Nurse requesting to defer OT today. Will f/u for OT on Monday per POC.   Abdiaziz Klahn V Jasime Westergren, OT

## 2024-08-19 NOTE — Wound Image (Signed)
 WOCN Note:     New consult for leg wounds   Seen in 610/01     83 y.o. female admitted on 08/17/2024 from  Admitted for  Cellulitis [L03.90]  Cellulitis of right lower extremity [L03.115]  Frequent falls [R29.6]     Past Medical History:   Diagnosis Date    Arthritis     Asthma     Cerebrovascular accident (CVA) (HCC) 08/06/2022    Chronic renal disease, stage III (HCC) [698720] 10/13/2022    Depression     Diabetes (HCC)     Fibromyalgia     Fibromyalgia     Gastroparesis     Hyperlipemia     Hypertension     Neuropathy     Psychotic disorder (HCC)     Stroke [facility])        Lab Results   Component Value Date/Time    WBC 12.1 (H) 08/19/2024 06:30 AM    LABA1C 7.8 (H) 08/18/2024 03:10 AM    POCGLU 189 (H) 08/19/2024 12:44 PM    POCGLU 191 (H) 08/19/2024 08:21 AM    HGB 8.1 (L) 08/19/2024 06:30 AM    HCT 26.8 (L) 08/19/2024 06:30 AM    PLT 315 08/19/2024 06:30 AM     No opportunity for an appropriate culture collection.   Diet: ADULT DIET; Regular; 4 carb choices (60 gm/meal); Low Fat/Low Chol/High Fiber/2 gm Na           Assessment:   Communicative and does some word searching.  Unable to complete her thoughts.   Reports extreme pain with wound care after she was pre-medicated by RN  Surface: Stryker gel mattress  RN reports no concerns to sacrum and protective dressing in use.    Bilateral heels intact without erythema & offloaded with pillows.     right:   Left:      POA bilateral lower legs with chronic venous stasis ulcerations  LLL is mostly resurfaced with pebble texture and scattered partial thickness wounds on medial side        RLL is open almost circumferentially with yellow exudate adhered to anterior surface; posterior is red.        Lower legs cleaned with Vashe for 10-min soak.  Ag foam applied and secured with ACE wrap. Heels offloaded with pillows    Recommend:    BLE: 10 min soak with Vashe-wet gauze.  Pat skin dry.  Apply Opifoam AG NB and secure with ACE wrap from toes to knee wrapped only tight  enough to hold dressing in place (should have stretch still remaining).     and Braden Protocol:  Turn/reposition approximately every 2 hours  Offload heels with heels hanging off end of pillow at all times while in bed.  Sacral Preventive dressing: change as needed ~every 4 days. Discontinue if incontinence is frequently soiling dressing.     Promote continence.    Transition of Care: Plan to follow weekly and as needed while admitted to hospital.     Mliss Ferrari, BSN, RN, [facility]  Certified Wound, Ostomy, Continence Nurse  office [phone]  Available via [facility]

## 2024-08-19 NOTE — Care Coordination-Inpatient (Addendum)
 CM spoke with patient's daughter, Bernice Ellen [phone] and provided [facility].  Patient's daughter stated she is not familiar with facilities here but her brother is and he is going to check into some of them this even. CM to continue to follow.    5:28 PM  CM received call from patient's daughter providing CM with 2 SNF choices, [facility] & [facility].  Referrals sent via careport.           Leotis Mandes, BSc  Care Manager  Available via [employer]  [phone]

## 2024-08-20 LAB — CBC WITH AUTO DIFFERENTIAL
Basophils %: 0.7 % (ref 0.0–1.0)
Basophils Absolute: 0.06 K/UL (ref 0.00–0.10)
Eosinophils %: 4.1 % (ref 0.0–7.0)
Eosinophils Absolute: 0.37 K/UL (ref 0.00–0.40)
Hematocrit: 23.3 % — ABNORMAL LOW (ref 35.0–47.0)
Hemoglobin: 7 g/dL — ABNORMAL LOW (ref 11.5–16.0)
Immature Granulocytes %: 0.4 % (ref 0.0–0.5)
Immature Granulocytes Absolute: 0.04 K/UL (ref 0.00–0.04)
Lymphocytes %: 15.8 % (ref 12.0–49.0)
Lymphocytes Absolute: 1.41 K/UL (ref 0.80–3.50)
MCH: 23.2 pg — ABNORMAL LOW (ref 26.0–34.0)
MCHC: 30 g/dL (ref 30.0–36.5)
MCV: 77.2 FL — ABNORMAL LOW (ref 80.0–99.0)
MPV: 12.7 FL (ref 8.9–12.9)
Monocytes %: 7.5 % (ref 5.0–13.0)
Monocytes Absolute: 0.67 K/UL (ref 0.00–1.00)
Neutrophils %: 71.5 % (ref 32.0–75.0)
Neutrophils Absolute: 6.39 K/UL (ref 1.80–8.00)
Nucleated RBCs: 0 /100{WBCs}
Platelets: 277 K/uL (ref 150–400)
RBC: 3.02 M/uL — ABNORMAL LOW (ref 3.80–5.20)
RDW: 18.6 % — ABNORMAL HIGH (ref 11.5–14.5)
WBC: 8.9 K/uL (ref 3.6–11.0)
nRBC: 0 K/uL (ref 0.00–0.01)

## 2024-08-20 LAB — BASIC METABOLIC PANEL
Anion Gap: 12 mmol/L (ref 2–14)
BUN/Creatinine Ratio: 24 — ABNORMAL HIGH (ref 12–20)
BUN: 27 mg/dL — ABNORMAL HIGH (ref 8–23)
CO2: 23 mmol/L (ref 20–29)
Calcium: 8.5 mg/dL — ABNORMAL LOW (ref 8.8–10.2)
Chloride: 100 mmol/L (ref 98–107)
Creatinine: 1.12 mg/dL — ABNORMAL HIGH (ref 0.60–1.00)
Est, Glom Filt Rate: 50 ml/min/1.73m2 — ABNORMAL LOW (ref >90)
Glucose: 121 mg/dL — ABNORMAL HIGH (ref 65–100)
Potassium: 4.4 mmol/L (ref 3.5–5.1)
Sodium: 135 mmol/L — ABNORMAL LOW (ref 136–145)

## 2024-08-20 LAB — CULTURE, WOUND (WITH GRAM STAIN)

## 2024-08-20 LAB — POCT GLUCOSE
POC Glucose: 184 mg/dL — ABNORMAL HIGH (ref 65–117)
POC Glucose: 152 mg/dL — ABNORMAL HIGH (ref 65–117)
POC Glucose: 162 mg/dL — ABNORMAL HIGH (ref 65–117)

## 2024-08-20 MED ORDER — VANCOMYCIN INTERMITTENT DOSING (PLACEHOLDER)
Freq: Once | INTRAVENOUS | Status: DC
Start: 2024-08-20 — End: 2024-08-21

## 2024-08-20 MED ORDER — SODIUM CHLORIDE 0.9 % IV BOLUS
0.9 | Freq: Once | INTRAVENOUS | Status: AC
Start: 2024-08-20 — End: 2024-08-20
  Administered 2024-08-20: 16:00:00 500 mL via INTRAVENOUS

## 2024-08-20 MED ORDER — SODIUM CHLORIDE 0.9 % IV SOLN (ADDEASE)
0.9 | Freq: Two times a day (BID) | INTRAVENOUS | Status: DC
Start: 2024-08-20 — End: 2024-08-20

## 2024-08-20 MED ORDER — SODIUM CHLORIDE 0.9 % IV SOLN (ADDEASE)
0.9 | INTRAVENOUS | Status: DC
Start: 2024-08-20 — End: 2024-08-23
  Administered 2024-08-21 – 2024-08-23 (×3): 2000 mg via INTRAVENOUS

## 2024-08-20 MED FILL — KETOROLAC TROMETHAMINE 30 MG/ML IJ SOLN: 30 mg/mL | INTRAMUSCULAR | Qty: 1

## 2024-08-20 MED FILL — PULMICORT 0.25 MG/2ML IN SUSP: 0.25 MG/2ML | RESPIRATORY_TRACT | Qty: 2

## 2024-08-20 MED FILL — CEFEPIME HCL 2 G IV SOLR: 2 g | INTRAVENOUS | Qty: 2

## 2024-08-20 MED FILL — SENNA PLUS 8.6-50 MG PO TABS: 8.6-50 mg | ORAL | Qty: 2

## 2024-08-20 MED FILL — PREGABALIN 75 MG PO CAPS: 75 mg | ORAL | Qty: 2

## 2024-08-20 MED FILL — CARVEDILOL 12.5 MG PO TABS: 12.5 mg | ORAL | Qty: 1

## 2024-08-20 MED FILL — AMLODIPINE BESYLATE 5 MG PO TABS: 5 mg | ORAL | Qty: 1

## 2024-08-20 MED FILL — LANTUS 100 UNIT/ML SC SOLN: 100 [IU]/mL | SUBCUTANEOUS | Qty: 10

## 2024-08-20 MED FILL — ENOXAPARIN SODIUM 40 MG/0.4ML IJ SOSY: 40 MG/0.4ML | INTRAMUSCULAR | Qty: 0.4

## 2024-08-20 MED FILL — SODIUM CHLORIDE 0.9 % IV SOLN: 0.9 % | INTRAVENOUS | Qty: 500

## 2024-08-20 MED FILL — HUMALOG 100 UNIT/ML IJ SOLN: 100 [IU]/mL | INTRAMUSCULAR | Qty: 1

## 2024-08-20 MED FILL — MIRALAX MIX-IN PAX 17 G PO PACK: 17 g | ORAL | Qty: 1

## 2024-08-20 MED FILL — PANTOPRAZOLE SODIUM 40 MG PO TBEC: 40 mg | ORAL | Qty: 1

## 2024-08-20 NOTE — Progress Notes (Signed)
  [facility]                                                                               Hospitalist Progress Note  Deitra LELON Sharps, MD  Answering service: [phone] OR [phone] from in house phone        Date of Service:  08/20/2024  NAME:  Jamie Bryant  DOB:  Apr 16, 1941  MRN:  [id number]       Admission Summary:   Jamie Bryant is a 83 y.o. female with a pmhx DM II, gastroparesis, CKD III, past stroke, HTN, and dyslipidemia who presents via EMS for fall, and is being admitted for cellulitis.     In the ED, she was tachycardic to 101, and hypoxic to 89% on RA.  Other VSS.  Labs showed WBC 14.8, microcytic anemia with Hgb 8.5 (b/l 8-9), BUN 30, creatinine 1.04, CK 898. CXR and XR lumbar spine without acute abnormalities.     In the ED, she received robaxin , vancomycin , and tylenol .      Interval history / Subjective:   Wound pain seems better controlled on current regimen  NAD  AFVSS on RA     Assessment & Plan:     Sepsis 2/2 cellulitis, b/l LE wounds POA  -wound cx with MSSA and Pseudomonas, cont Cefepime , dc Vanc  -blood cultures NGTD  -leukocytosis resolved  -She was followed in wound care until March 2025 when she was lost to follow-up  -appreciate wound care  -pain mgmt     #Fall  -Endorses mechanical fall at her facility.  Denies loss of consciousness, or lightheadedness.  Lives in independent living section of her facility.  She has a son in the area who is currently out of town, but will be back over the weekend  - CT head, CT C-spine nothing acute  - PT/OT rec SNF     #DM2, A1c 7.8  #Gastroparesis  #Diabetic neuropathy  - hold home DDP 4   -ISS, Lantus      #Hypertension  - Continue home carvedilol  and Norvasc      #Dyslipidemia     #GERD  - PPI     #dementia  -supportive care    Prerenal azotemia  Slight bump in Cr  Elevated CPK  Mild hyponatremia  -small IV NS bolus    Chronic anemia  Monitor      Code status: Full Code No additional code details  Prophylaxis: Lovenox   ppx  Care Plan discussed with: Patient/Family  Anticipated Disposition: SNF  Anticipated Discharge: >48h  Admission Status: Inpatient  Cardiac monitoring: None  Central Line:            Social Drivers of Health     Tobacco Use: Low Risk  (06/07/2024)    Patient History     Smoking Tobacco Use: Never     Smokeless Tobacco Use: Never     Passive Exposure: Never   Alcohol Use: Not At Risk (11/17/2023)    AUDIT-C     Frequency of Alcohol Consumption: Never     Average Number of Drinks: Patient does not drink     Frequency of Binge Drinking: Never  Financial Resource Strain: Medium Risk (11/17/2023)    Overall Financial Resource Strain (CARDIA)     Difficulty of Paying Living Expenses: Somewhat hard   Food Insecurity: No Food Insecurity (03/07/2024)    Hunger Vital Sign     Worried About Running Out of Food in the Last Year: Never true     Ran Out of Food in the Last Year: Never true   Transportation Needs: No Transportation Needs (03/07/2024)    PRAPARE - Therapist, art (Medical): No     Lack of Transportation (Non-Medical): No   Physical Activity: Insufficiently Active (11/17/2023)    Exercise Vital Sign     Days of Exercise per Week: 2 days     Minutes of Exercise per Session: 30 min   Stress: No Stress Concern Present (05/26/2023)    [employer] of Occupational Health - Occupational Stress Questionnaire     Feeling of Stress : Only a little   Social Connections: Unknown (05/26/2023)    Social Connection and Isolation Panel     Frequency of Communication with Friends and Family: Once a week     Frequency of Social Gatherings with Friends and Family: Once a week     Attends Religious Services: Never     Database administrator or Organizations: No     Attends Banker Meetings: Never     Marital Status: Not on file   Recent Concern: Social Connections - Socially Isolated (05/26/2023)    Social Connection and Isolation Panel     Frequency of Communication with Friends and Family:  Once a week     Frequency of Social Gatherings with Friends and Family: Once a week     Attends Religious Services: Never     Database administrator or Organizations: No     Attends Banker Meetings: Never     Marital Status: Divorced   Catering manager Violence: Not At Risk (05/26/2023)    Humiliation, Afraid, Rape, and Kick questionnaire     Fear of Current or Ex-Partner: No     Emotionally Abused: No     Physically Abused: No     Sexually Abused: No   Depression: Not at risk (06/07/2024)    PHQ-2     PHQ-2 Score: 0   Housing Stability: Low Risk  (03/07/2024)    Housing Stability Vital Sign     Unable to Pay for Housing in the Last Year: No     Number of Times Moved in the Last Year: 0     Homeless in the Last Year: No   Interpersonal Safety: Not At Risk (08/17/2024)    Interpersonal Safety Domain Source: IP Abuse Screening     Physical abuse: Denies     Verbal abuse: Denies     Emotional abuse: Denies     Financial abuse: Denies     Sexual abuse: Denies   Utilities: Not At Risk (03/07/2024)    [facility] Utilities     Threatened with loss of utilities: No       Review of Systems:   Pertinent items are noted in HPI.    Vital Signs:    Last 24hrs VS reviewed since prior progress note. Most recent are:  Vitals:    08/20/24 0929   BP:    Pulse: 70   Resp: 16   Temp:    SpO2: 98%       No intake or output data  in the 24 hours ending 08/20/24 1022     Physical Examination:   I had a face to face encounter with this patient and independently examined them on 08/20/2024 as outlined below:    General: Alert, NAD  Resp:  CTA bilaterally, No accessory muscle use  CV:  RRR, 1/6 SM  GI:  Soft, Non distended, Non tender  Neurologic:  Alert and oriented, MAE  Extremities: B/l LE wrapped  Psych:  Not anxious or agitated    Data Review:   I have personally and independently reviewed all pertinent labs, diagnostic studies, imaging, and have provided independent interpretation of the same.    Labs:     Recent Labs      08/19/24  0630 08/20/24  0649   WBC 12.1* 8.9   HGB 8.1* 7.0*   HCT 26.8* 23.3*   PLT 315 277     Recent Labs     08/18/24  0310 08/19/24  0630 08/20/24  0649   NA 139 134* 135*   K 4.3 4.8 4.4   CL 102 98 100   CO2 27 24 23    BUN 21 19 27*     No results for input(s): ALT, TP, GLOB, GGT in the last 72 hours.    Invalid input(s): SGOT, GPT, AP, TBIL, TBILI, ALB, AML, AMYP, LPSE, HLPSE    No results for input(s): INR, APTT in the last 72 hours.    Invalid input(s): PTP   No results for input(s): TIBC in the last 72 hours.    Invalid input(s): FE, PSAT, FERR   No results found for: RBCF   No results for input(s): PH, PCO2, PO2 in the last 72 hours.  No results for input(s): CPK in the last 72 hours.    Invalid input(s): CPKMB, CKNDX, TROIQ  Lab Results   Component Value Date/Time    CHOL 150 06/09/2023 02:50 PM    HDL 52 06/09/2023 02:50 PM    LDL 71.2 06/09/2023 02:50 PM    LDL 119.4 06/10/2022 04:36 PM     No results found for: GLUCPOC  Lab Results   Component Value Date/Time    APPEARANCE CLEAR 08/17/2024 08:18 AM    COLORU YELLOW/STRAW 08/17/2024 08:18 AM    NITRU Negative 08/17/2024 08:18 AM    GLUCOSEU Negative 08/17/2024 08:18 AM    GLUCOSEU Negative 06/10/2022 04:36 PM    KETUA Negative 08/17/2024 08:18 AM    UROBILINOGEN 0.2 08/17/2024 08:18 AM    BILIRUBINUR Negative 08/17/2024 08:18 AM       Notes reviewed from all clinical/nonclinical/nursing services involved in patient's clinical care. Care coordination discussions were held with appropriate clinical/nonclinical/ nursing providers based on care coordination needs.     Patients current active Medications were reviewed, considered, added and adjusted based on the clinical condition today.      Medications Reviewed:     Current Facility-Administered Medications   Medication Dose Route Frequency    [START ON 08/21/2024] vancomycin  random level 8/24 with AM labs   Other Once    [START ON 08/21/2024]  ceFEPIme  (MAXIPIME ) 2,000 mg in sodium chloride  0.9 % 100 mL IVPB (addEASE)  2,000 mg IntraVENous Q24H    HYDROmorphone  (DILAUDID ) injection 0.5 mg  0.5 mg IntraVENous Q3H PRN    oxyCODONE  (ROXICODONE ) immediate release tablet 5 mg  5 mg Oral Q4H PRN    Or    oxyCODONE  (ROXICODONE ) immediate release tablet 10 mg  10 mg Oral Q4H PRN    naloxone  (  NARCAN ) injection 0.4 mg  0.4 mg IntraVENous PRN    ketorolac  (TORADOL ) injection 15 mg  15 mg IntraVENous q8h    sennosides-docusate sodium  (SENOKOT-S) 8.6-50 MG tablet 2 tablet  2 tablet Oral Daily    polyethylene glycol (GLYCOLAX ) packet 17 g  17 g Oral Daily    linaclotide  (LINZESS ) capsule 290 mcg  290 mcg Oral QAM AC    insulin  glargine (LANTUS ) injection vial 10 Units  10 Units SubCUTAneous Nightly    traMADol  (ULTRAM ) tablet 50 mg  50 mg Oral Q6H PRN    enoxaparin  (LOVENOX ) injection 40 mg  40 mg SubCUTAneous Daily    sodium chloride  flush 0.9 % injection 5-40 mL  5-40 mL IntraVENous 2 times per day    sodium chloride  flush 0.9 % injection 5-40 mL  5-40 mL IntraVENous PRN    0.9 % sodium chloride  infusion   IntraVENous PRN    magnesium  sulfate 2000 mg in 50 mL IVPB premix  2,000 mg IntraVENous PRN    ondansetron  (ZOFRAN -ODT) disintegrating tablet 4 mg  4 mg Oral Q8H PRN    Or    ondansetron  (ZOFRAN ) injection 4 mg  4 mg IntraVENous Q6H PRN    acetaminophen  (TYLENOL ) tablet 650 mg  650 mg Oral Q6H PRN    Or    acetaminophen  (TYLENOL ) suppository 650 mg  650 mg Rectal Q6H PRN    budesonide  (PULMICORT ) nebulizer suspension 250 mcg  0.25 mg Nebulization BID    pantoprazole  (PROTONIX ) tablet 40 mg  40 mg Oral QAM AC    pregabalin  (LYRICA ) capsule 150 mg  150 mg Oral BID    carvedilol  (COREG ) tablet 12.5 mg  12.5 mg Oral BID    amLODIPine  (NORVASC ) tablet 5 mg  5 mg Oral Daily    glucose chewable tablet 16 g  4 tablet Oral PRN    dextrose  bolus 10% 125 mL  125 mL IntraVENous PRN    Or    dextrose  bolus 10% 250 mL  250 mL IntraVENous PRN    glucagon  injection 1 mg  1 mg  SubCUTAneous PRN    dextrose  10 % infusion   IntraVENous Continuous PRN    insulin  lispro (HUMALOG ,ADMELOG ) injection vial 0-4 Units  0-4 Units SubCUTAneous 4x Daily AC & HS     ______________________________________________________________________  EXPECTED LENGTH OF STAY: 5  ACTUAL LENGTH OF STAY:          3                 Deitra LELON Sharps, MD

## 2024-08-20 NOTE — Plan of Care (Signed)
 Problem: Chronic Conditions and Co-morbidities  Goal: Patient's chronic conditions and co-morbidity symptoms are monitored and maintained or improved  Outcome: Progressing     Problem: Skin/Tissue Integrity  Goal: Skin integrity remains intact  Description: 1.  Monitor for areas of redness and/or skin breakdown  2.  Assess vascular access sites hourly  3.  Every 4-6 hours minimum:  Change oxygen saturation probe site  4.  Every 4-6 hours:  If on nasal continuous positive airway pressure, respiratory therapy assess nares and determine need for appliance change or resting period  Outcome: Progressing     Problem: Safety - Adult  Goal: Free from fall injury  Outcome: Progressing     Problem: Pain  Goal: Verbalizes/displays adequate comfort level or baseline comfort level  Outcome: Progressing

## 2024-08-21 LAB — POCT GLUCOSE
POC Glucose: 140 mg/dL — ABNORMAL HIGH (ref 65–117)
POC Glucose: 154 mg/dL — ABNORMAL HIGH (ref 65–117)
POC Glucose: 164 mg/dL — ABNORMAL HIGH (ref 65–117)
POC Glucose: 170 mg/dL — ABNORMAL HIGH (ref 65–117)

## 2024-08-21 LAB — CBC WITH AUTO DIFFERENTIAL
Basophils %: 0.5 % (ref 0.0–1.0)
Basophils Absolute: 0.05 K/UL (ref 0.00–0.10)
Eosinophils %: 4 % (ref 0.0–7.0)
Eosinophils Absolute: 0.41 K/UL — ABNORMAL HIGH (ref 0.00–0.40)
Hematocrit: 23.8 % — ABNORMAL LOW (ref 35.0–47.0)
Hemoglobin: 7.3 g/dL — ABNORMAL LOW (ref 11.5–16.0)
Immature Granulocytes %: 0.4 % (ref 0.0–0.5)
Immature Granulocytes Absolute: 0.04 K/UL (ref 0.00–0.04)
Lymphocytes %: 15.9 % (ref 12.0–49.0)
Lymphocytes Absolute: 1.62 K/UL (ref 0.80–3.50)
MCH: 23.4 pg — ABNORMAL LOW (ref 26.0–34.0)
MCHC: 30.7 g/dL (ref 30.0–36.5)
MCV: 76.3 FL — ABNORMAL LOW (ref 80.0–99.0)
MPV: 11.7 FL (ref 8.9–12.9)
Monocytes %: 7.3 % (ref 5.0–13.0)
Monocytes Absolute: 0.74 K/UL (ref 0.00–1.00)
Neutrophils %: 71.9 % (ref 32.0–75.0)
Neutrophils Absolute: 7.3 K/UL (ref 1.80–8.00)
Nucleated RBCs: 0 /100{WBCs}
Platelets: 291 K/uL (ref 150–400)
RBC: 3.12 M/uL — ABNORMAL LOW (ref 3.80–5.20)
RDW: 18.5 % — ABNORMAL HIGH (ref 11.5–14.5)
WBC: 10.2 K/uL (ref 3.6–11.0)
nRBC: 0 K/uL (ref 0.00–0.01)

## 2024-08-21 LAB — C-REACTIVE PROTEIN: CRP: 13.5 mg/dL — ABNORMAL HIGH (ref 0.0–0.5)

## 2024-08-21 LAB — BASIC METABOLIC PANEL
Anion Gap: 9 mmol/L (ref 2–14)
BUN/Creatinine Ratio: 27 — ABNORMAL HIGH (ref 12–20)
BUN: 25 mg/dL — ABNORMAL HIGH (ref 8–23)
CO2: 23 mmol/L (ref 20–29)
Calcium: 8.6 mg/dL — ABNORMAL LOW (ref 8.8–10.2)
Chloride: 104 mmol/L (ref 98–107)
Creatinine: 0.95 mg/dL (ref 0.60–1.00)
Est, Glom Filt Rate: 61 ml/min/1.73m2 — ABNORMAL LOW (ref >90)
Glucose: 127 mg/dL — ABNORMAL HIGH (ref 65–100)
Potassium: 4.4 mmol/L (ref 3.5–5.1)
Sodium: 136 mmol/L (ref 136–145)

## 2024-08-21 LAB — MAGNESIUM: Magnesium: 2.3 mg/dL (ref 1.6–2.4)

## 2024-08-21 LAB — CK: Total CK: 291 U/L — ABNORMAL HIGH (ref 26–192)

## 2024-08-21 LAB — PHOSPHORUS: Phosphorus: 3.2 mg/dL (ref 2.5–4.5)

## 2024-08-21 LAB — VANCOMYCIN LEVEL, RANDOM: Vancomycin Rm: 7 ug/mL

## 2024-08-21 MED FILL — OXYCODONE HCL 5 MG PO TABS: 5 mg | ORAL | Qty: 1

## 2024-08-21 MED FILL — SENNA PLUS 8.6-50 MG PO TABS: 8.6-50 mg | ORAL | Qty: 2

## 2024-08-21 MED FILL — OXYCODONE HCL 5 MG PO TABS: 5 mg | ORAL | Qty: 2

## 2024-08-21 MED FILL — PREGABALIN 75 MG PO CAPS: 75 mg | ORAL | Qty: 2

## 2024-08-21 MED FILL — CEFEPIME HCL 2 G IV SOLR: 2 g | INTRAVENOUS | Qty: 2

## 2024-08-21 MED FILL — ENOXAPARIN SODIUM 40 MG/0.4ML IJ SOSY: 40 MG/0.4ML | INTRAMUSCULAR | Qty: 0.4

## 2024-08-21 MED FILL — PANTOPRAZOLE SODIUM 40 MG PO TBEC: 40 mg | ORAL | Qty: 1

## 2024-08-21 MED FILL — AMLODIPINE BESYLATE 5 MG PO TABS: 5 mg | ORAL | Qty: 1

## 2024-08-21 MED FILL — LANTUS 100 UNIT/ML SC SOLN: 100 [IU]/mL | SUBCUTANEOUS | Qty: 10

## 2024-08-21 MED FILL — CARVEDILOL 12.5 MG PO TABS: 12.5 mg | ORAL | Qty: 1

## 2024-08-21 NOTE — Plan of Care (Signed)
 Problem: Chronic Conditions and Co-morbidities  Goal: Patient's chronic conditions and co-morbidity symptoms are monitored and maintained or improved  Outcome: Progressing     Problem: Skin/Tissue Integrity  Goal: Skin integrity remains intact  Description: 1.  Monitor for areas of redness and/or skin breakdown  2.  Assess vascular access sites hourly  3.  Every 4-6 hours minimum:  Change oxygen saturation probe site  4.  Every 4-6 hours:  If on nasal continuous positive airway pressure, respiratory therapy assess nares and determine need for appliance change or resting period  Outcome: Progressing     Problem: Safety - Adult  Goal: Free from fall injury  Outcome: Progressing     Problem: Pain  Goal: Verbalizes/displays adequate comfort level or baseline comfort level  Outcome: Progressing

## 2024-08-21 NOTE — Progress Notes (Signed)
 [facility]                                                                               Hospitalist Progress Note  Deitra LELON Sharps, MD  Answering service: [phone] OR [phone] from in house phone        Date of Service:  08/21/2024  NAME:  Jamie Bryant  DOB:  Jun 27, 1941  MRN:  [id number]       Admission Summary:   CLAIRA JETER is a 83 y.o. female with a pmhx DM II, gastroparesis, CKD III, past stroke, HTN, and dyslipidemia who presents via EMS for fall, and is being admitted for cellulitis.     In the ED, she was tachycardic to 101, and hypoxic to 89% on RA.  Other VSS.  Labs showed WBC 14.8, microcytic anemia with Hgb 8.5 (b/l 8-9), BUN 30, creatinine 1.04, CK 898. CXR and XR lumbar spine without acute abnormalities.     In the ED, she received robaxin , vancomycin , and tylenol .      Interval history / Subjective:   Looks more comfortable today though still reports pain 15/10  NAD  AFVSS on RA     Assessment & Plan:     Sepsis 2/2 cellulitis, b/l LE wounds POA  -wound cx with MSSA and Pseudomonas, cont Cefepime , dc'd Vanc  -blood cultures NG  -leukocytosis resolved  -She was followed in wound care until March 2025 when she was lost to follow-up  -appreciate wound care  -pain mgmt     #Fall  -Endorses mechanical fall at her facility.  Denies loss of consciousness, or lightheadedness.  Lives in independent living section of her facility.  She has a son in the area who is currently out of town, but will be back over the weekend  - CT head, CT C-spine nothing acute  - PT/OT rec SNF     #DM2, A1c 7.8  #Gastroparesis  #Diabetic neuropathy  - hold home DDP 4   -ISS, Lantus      #Hypertension  - Continue home carvedilol  and Norvasc      #Dyslipidemia     #GERD  - PPI     #dementia  -supportive care    Prerenal azotemia  Slight bump in Cr  Elevated CPK  Mild hyponatremia  -improved s/p IV NS bolus    Chronic anemia  Monitor, stable      Code status: Full Code No additional code  details  Prophylaxis: Lovenox  ppx  Care Plan discussed with: Patient/Family  Anticipated Disposition: SNF  Anticipated Discharge: 24-48h  Admission Status: Inpatient  Cardiac monitoring: None  Central Line:            Social Drivers of Health     Tobacco Use: Low Risk  (06/07/2024)    Patient History     Smoking Tobacco Use: Never     Smokeless Tobacco Use: Never     Passive Exposure: Never   Alcohol Use: Not At Risk (11/17/2023)    AUDIT-C     Frequency of Alcohol Consumption: Never     Average Number of Drinks: Patient does not drink     Frequency of Binge  Drinking: Never   Physicist, medical Strain: Medium Risk (11/17/2023)    Overall Financial Resource Strain (CARDIA)     Difficulty of Paying Living Expenses: Somewhat hard   Food Insecurity: No Food Insecurity (03/07/2024)    Hunger Vital Sign     Worried About Running Out of Food in the Last Year: Never true     Ran Out of Food in the Last Year: Never true   Transportation Needs: No Transportation Needs (03/07/2024)    PRAPARE - Therapist, art (Medical): No     Lack of Transportation (Non-Medical): No   Physical Activity: Insufficiently Active (11/17/2023)    Exercise Vital Sign     Days of Exercise per Week: 2 days     Minutes of Exercise per Session: 30 min   Stress: No Stress Concern Present (05/26/2023)    [employer] of Occupational Health - Occupational Stress Questionnaire     Feeling of Stress : Only a little   Social Connections: Unknown (05/26/2023)    Social Connection and Isolation Panel     Frequency of Communication with Friends and Family: Once a week     Frequency of Social Gatherings with Friends and Family: Once a week     Attends Religious Services: Never     Database administrator or Organizations: No     Attends Banker Meetings: Never     Marital Status: Not on file   Recent Concern: Social Connections - Socially Isolated (05/26/2023)    Social Connection and Isolation Panel     Frequency of  Communication with Friends and Family: Once a week     Frequency of Social Gatherings with Friends and Family: Once a week     Attends Religious Services: Never     Database administrator or Organizations: No     Attends Banker Meetings: Never     Marital Status: Divorced   Catering manager Violence: Not At Risk (05/26/2023)    Humiliation, Afraid, Rape, and Kick questionnaire     Fear of Current or Ex-Partner: No     Emotionally Abused: No     Physically Abused: No     Sexually Abused: No   Depression: Not at risk (06/07/2024)    PHQ-2     PHQ-2 Score: 0   Housing Stability: Low Risk  (03/07/2024)    Housing Stability Vital Sign     Unable to Pay for Housing in the Last Year: No     Number of Times Moved in the Last Year: 0     Homeless in the Last Year: No   Interpersonal Safety: Not At Risk (08/17/2024)    Interpersonal Safety Domain Source: IP Abuse Screening     Physical abuse: Denies     Verbal abuse: Denies     Emotional abuse: Denies     Financial abuse: Denies     Sexual abuse: Denies   Utilities: Not At Risk (03/07/2024)    AHC Utilities     Threatened with loss of utilities: No       Review of Systems:   Pertinent items are noted in HPI.    Vital Signs:    Last 24hrs VS reviewed since prior progress note. Most recent are:  Vitals:    08/21/24 0941   BP:    Pulse: 75   Resp: 16   Temp:    SpO2: 98%  Intake/Output Summary (Last 24 hours) at 08/21/2024 1445  Last data filed at 08/21/2024 9346  Gross per 24 hour   Intake --   Output 1150 ml   Net -1150 ml        Physical Examination:   I had a face to face encounter with this patient and independently examined them on 08/21/2024 as outlined below:    General: Alert, NAD  Resp:  CTA bilaterally, No accessory muscle use  CV:  RRR, 1/6 SM  GI:  Soft, Non distended  Neurologic:  Alert and oriented, MAE  Extremities: B/l LE wrapped  Psych:  Flat affect, slow speaking    Data Review:   I have personally and independently reviewed all pertinent labs,  diagnostic studies, imaging, and have provided independent interpretation of the same.    Labs:     Recent Labs     08/20/24  0649 08/21/24  0520   WBC 8.9 10.2   HGB 7.0* 7.3*   HCT 23.3* 23.8*   PLT 277 291     Recent Labs     08/19/24  0630 08/20/24  0649 08/21/24  0520   NA 134* 135* 136   K 4.8 4.4 4.4   CL 98 100 104   CO2 24 23 23    BUN 19 27* 25*   MG  --   --  2.3   PHOS  --   --  3.2     No results for input(s): ALT, TP, GLOB, GGT in the last 72 hours.    Invalid input(s): SGOT, GPT, AP, TBIL, TBILI, ALB, AML, AMYP, LPSE, HLPSE    No results for input(s): INR, APTT in the last 72 hours.    Invalid input(s): PTP   No results for input(s): TIBC in the last 72 hours.    Invalid input(s): FE, PSAT, FERR   No results found for: RBCF   No results for input(s): PH, PCO2, PO2 in the last 72 hours.  No results for input(s): CPK in the last 72 hours.    Invalid input(s): CPKMB, CKNDX, TROIQ  Lab Results   Component Value Date/Time    CHOL 150 06/09/2023 02:50 PM    HDL 52 06/09/2023 02:50 PM    LDL 71.2 06/09/2023 02:50 PM    LDL 119.4 06/10/2022 04:36 PM     No results found for: GLUCPOC  Lab Results   Component Value Date/Time    APPEARANCE CLEAR 08/17/2024 08:18 AM    COLORU YELLOW/STRAW 08/17/2024 08:18 AM    NITRU Negative 08/17/2024 08:18 AM    GLUCOSEU Negative 08/17/2024 08:18 AM    GLUCOSEU Negative 06/10/2022 04:36 PM    KETUA Negative 08/17/2024 08:18 AM    UROBILINOGEN 0.2 08/17/2024 08:18 AM    BILIRUBINUR Negative 08/17/2024 08:18 AM       Notes reviewed from all clinical/nonclinical/nursing services involved in patient's clinical care. Care coordination discussions were held with appropriate clinical/nonclinical/ nursing providers based on care coordination needs.     Patients current active Medications were reviewed, considered, added and adjusted based on the clinical condition today.      Medications Reviewed:     Current  Facility-Administered Medications   Medication Dose Route Frequency    ceFEPIme  (MAXIPIME ) 2,000 mg in sodium chloride  0.9 % 100 mL IVPB (addEASE)  2,000 mg IntraVENous Q24H    HYDROmorphone  (DILAUDID ) injection 0.5 mg  0.5 mg IntraVENous Q3H PRN    oxyCODONE  (ROXICODONE ) immediate release tablet 5 mg  5  mg Oral Q4H PRN    Or    oxyCODONE  (ROXICODONE ) immediate release tablet 10 mg  10 mg Oral Q4H PRN    naloxone  (NARCAN ) injection 0.4 mg  0.4 mg IntraVENous PRN    sennosides-docusate sodium  (SENOKOT-S) 8.6-50 MG tablet 2 tablet  2 tablet Oral Daily    polyethylene glycol (GLYCOLAX ) packet 17 g  17 g Oral Daily    insulin  glargine (LANTUS ) injection vial 10 Units  10 Units SubCUTAneous Nightly    traMADol  (ULTRAM ) tablet 50 mg  50 mg Oral Q6H PRN    enoxaparin  (LOVENOX ) injection 40 mg  40 mg SubCUTAneous Daily    sodium chloride  flush 0.9 % injection 5-40 mL  5-40 mL IntraVENous 2 times per day    sodium chloride  flush 0.9 % injection 5-40 mL  5-40 mL IntraVENous PRN    0.9 % sodium chloride  infusion   IntraVENous PRN    magnesium  sulfate 2000 mg in 50 mL IVPB premix  2,000 mg IntraVENous PRN    ondansetron  (ZOFRAN -ODT) disintegrating tablet 4 mg  4 mg Oral Q8H PRN    Or    ondansetron  (ZOFRAN ) injection 4 mg  4 mg IntraVENous Q6H PRN    acetaminophen  (TYLENOL ) tablet 650 mg  650 mg Oral Q6H PRN    Or    acetaminophen  (TYLENOL ) suppository 650 mg  650 mg Rectal Q6H PRN    budesonide  (PULMICORT ) nebulizer suspension 250 mcg  0.25 mg Nebulization BID    pantoprazole  (PROTONIX ) tablet 40 mg  40 mg Oral QAM AC    pregabalin  (LYRICA ) capsule 150 mg  150 mg Oral BID    carvedilol  (COREG ) tablet 12.5 mg  12.5 mg Oral BID    amLODIPine  (NORVASC ) tablet 5 mg  5 mg Oral Daily    glucose chewable tablet 16 g  4 tablet Oral PRN    dextrose  bolus 10% 125 mL  125 mL IntraVENous PRN    Or    dextrose  bolus 10% 250 mL  250 mL IntraVENous PRN    glucagon  injection 1 mg  1 mg SubCUTAneous PRN    dextrose  10 % infusion   IntraVENous  Continuous PRN    insulin  lispro (HUMALOG ,ADMELOG ) injection vial 0-4 Units  0-4 Units SubCUTAneous 4x Daily AC & HS     ______________________________________________________________________  EXPECTED LENGTH OF STAY: 5  ACTUAL LENGTH OF STAY:          4                 Deitra LELON Sharps, MD

## 2024-08-22 LAB — CBC WITH AUTO DIFFERENTIAL
Basophils %: 0.7 % (ref 0.0–1.0)
Basophils Absolute: 0.07 K/UL (ref 0.00–0.10)
Eosinophils %: 4 % (ref 0.0–7.0)
Eosinophils Absolute: 0.39 K/UL (ref 0.00–0.40)
Hematocrit: 27 % — ABNORMAL LOW (ref 35.0–47.0)
Hemoglobin: 8.1 g/dL — ABNORMAL LOW (ref 11.5–16.0)
Immature Granulocytes %: 0.5 % (ref 0.0–0.5)
Immature Granulocytes Absolute: 0.05 K/UL — ABNORMAL HIGH (ref 0.00–0.04)
Lymphocytes %: 16.2 % (ref 12.0–49.0)
Lymphocytes Absolute: 1.59 K/UL (ref 0.80–3.50)
MCH: 23.1 pg — ABNORMAL LOW (ref 26.0–34.0)
MCHC: 30 g/dL (ref 30.0–36.5)
MCV: 77.1 FL — ABNORMAL LOW (ref 80.0–99.0)
MPV: 11.6 FL (ref 8.9–12.9)
Monocytes %: 7.1 % (ref 5.0–13.0)
Monocytes Absolute: 0.7 K/UL (ref 0.00–1.00)
Neutrophils %: 71.5 % (ref 32.0–75.0)
Neutrophils Absolute: 7.03 K/UL (ref 1.80–8.00)
Nucleated RBCs: 0 /100{WBCs}
Platelets: 367 K/uL (ref 150–400)
RBC: 3.5 M/uL — ABNORMAL LOW (ref 3.80–5.20)
RDW: 18.7 % — ABNORMAL HIGH (ref 11.5–14.5)
WBC: 9.8 K/uL (ref 3.6–11.0)
nRBC: 0 K/uL (ref 0.00–0.01)

## 2024-08-22 LAB — EKG 12-LEAD
Atrial Rate: 79 {beats}/min
Diagnosis: NORMAL
P Axis: 65 degrees
P-R Interval: 142 ms
Q-T Interval: 368 ms
QRS Duration: 82 ms
QTc Calculation (Bazett): 421 ms
R Axis: 18 degrees
T Axis: 36 degrees
Ventricular Rate: 79 {beats}/min

## 2024-08-22 LAB — CULTURE, BLOOD 1: Culture: NO GROWTH

## 2024-08-22 LAB — BASIC METABOLIC PANEL
Anion Gap: 3 mmol/L (ref 2–14)
BUN/Creatinine Ratio: 25 — ABNORMAL HIGH (ref 12–20)
BUN: 23 mg/dL (ref 8–23)
CO2: 30 mmol/L — ABNORMAL HIGH (ref 20–29)
Calcium: 8.9 mg/dL (ref 8.8–10.2)
Chloride: 102 mmol/L (ref 98–107)
Creatinine: 0.92 mg/dL (ref 0.60–1.00)
Est, Glom Filt Rate: 63 ml/min/1.73m2 — ABNORMAL LOW (ref >90)
Glucose: 128 mg/dL — ABNORMAL HIGH (ref 65–100)
Potassium: 6.6 mmol/L (ref 3.5–5.1)
Sodium: 135 mmol/L — ABNORMAL LOW (ref 136–145)

## 2024-08-22 LAB — POCT GLUCOSE
POC Glucose: 133 mg/dL — ABNORMAL HIGH (ref 65–117)
POC Glucose: 136 mg/dL — ABNORMAL HIGH (ref 65–117)
POC Glucose: 200 mg/dL — ABNORMAL HIGH (ref 65–117)
POC Glucose: 151 mg/dL — ABNORMAL HIGH (ref 65–117)

## 2024-08-22 LAB — CULTURE, BLOOD 2: Culture: NO GROWTH

## 2024-08-22 MED ORDER — SODIUM ZIRCONIUM CYCLOSILICATE 10 G PO PACK
10 | Freq: Once | ORAL | Status: AC
Start: 2024-08-22 — End: 2024-08-22
  Administered 2024-08-22: 10:00:00 10 g via ORAL

## 2024-08-22 MED ORDER — DIPHENHYDRAMINE-ZINC ACETATE 2-0.1 % EX CREA
2-0.1 | Freq: Three times a day (TID) | CUTANEOUS | Status: DC | PRN
Start: 2024-08-22 — End: 2024-08-23

## 2024-08-22 MED FILL — AMLODIPINE BESYLATE 5 MG PO TABS: 5 mg | ORAL | Qty: 1

## 2024-08-22 MED FILL — LOKELMA 10 G PO PACK: 10 g | ORAL | Qty: 1

## 2024-08-22 MED FILL — OXYCODONE HCL 5 MG PO TABS: 5 mg | ORAL | Qty: 1

## 2024-08-22 MED FILL — CARVEDILOL 12.5 MG PO TABS: 12.5 mg | ORAL | Qty: 1

## 2024-08-22 MED FILL — LANTUS 100 UNIT/ML SC SOLN: 100 [IU]/mL | SUBCUTANEOUS | Qty: 10

## 2024-08-22 MED FILL — ACETAMINOPHEN 325 MG PO TABS: 325 mg | ORAL | Qty: 2

## 2024-08-22 MED FILL — PULMICORT 0.25 MG/2ML IN SUSP: 0.25 MG/2ML | RESPIRATORY_TRACT | Qty: 2

## 2024-08-22 MED FILL — PANTOPRAZOLE SODIUM 40 MG PO TBEC: 40 mg | ORAL | Qty: 1

## 2024-08-22 MED FILL — CEFEPIME HCL 2 G IV SOLR: 2 g | INTRAVENOUS | Qty: 2

## 2024-08-22 MED FILL — BENADRYL ITCH STOPPING 1-0.1 % EX CREA: 1-0.1 % | CUTANEOUS | Qty: 1

## 2024-08-22 MED FILL — PREGABALIN 75 MG PO CAPS: 75 mg | ORAL | Qty: 2

## 2024-08-22 MED FILL — MIRALAX MIX-IN PAX 17 G PO PACK: 17 g | ORAL | Qty: 1

## 2024-08-22 MED FILL — ENOXAPARIN SODIUM 40 MG/0.4ML IJ SOSY: 40 MG/0.4ML | INTRAMUSCULAR | Qty: 0.4

## 2024-08-22 MED FILL — SENNA PLUS 8.6-50 MG PO TABS: 8.6-50 mg | ORAL | Qty: 2

## 2024-08-22 MED FILL — HUMALOG 100 UNIT/ML IJ SOLN: 100 [IU]/mL | INTRAMUSCULAR | Qty: 1

## 2024-08-22 NOTE — Plan of Care (Signed)
 Problem: Occupational Therapy - Adult  Goal: By Discharge: Performs self-care activities at highest level of function for planned discharge setting.  See evaluation for individualized goals.  Description: FUNCTIONAL STATUS PRIOR TO ADMISSION:  Patient was ambulatory using RW   , Prior Level of Assist for ADLs: Independent,  ,  ,  ,  ,  , Prior Level of Assist for Homemaking: Independent,  , Prior Level of Assist for Transfers: Independent, Active Driver: No     HOME SUPPORT: Patient lived alone at [facility] and reports independence with ADLS/IADLS .    Occupational Therapy Goals:  Initiated 08/17/2024  1.  Patient will perform grooming with Minimal Assist in sitting within 7 day(s).  2.  Patient will perform bathing with Maximal Assist within 7 day(s).  3.  Patient will perform lower body dressing with Maximal Assist within 7 day(s).  4.  Patient will perform toilet transfers with Maximal Assist  within 7 day(s).  5.  Patient will perform all aspects of toileting with Moderate Assist within 7 day(s).  6.  Patient will participate in upper extremity therapeutic exercise/activities with Moderate Assist for 5 minutes within 7 day(s).    7.  Patient will utilize energy conservation techniques during functional activities with verbal cues within 7 day(s).   Outcome: Progressing   OCCUPATIONAL THERAPY TREATMENT  Patient: Jamie Bryant (83 y.o. female)  Date: 08/22/2024  Primary Diagnosis: Cellulitis [L03.90]  Cellulitis of right lower extremity [L03.115]  Frequent falls [R29.6]       Precautions:                  Chart, occupational therapy assessment, plan of care, and goals were reviewed.    ASSESSMENT  Patient continues to benefit from skilled OT services and is slowly progressing towards goals. Patient is impacted by back pain, decreased ROM, general weakness, poor standing tolerance. Today's session focused on LB ADL - patient requires total assistance. Discussed need for use of adaptive equipment for increased  independence. Patient is familiar with use for AE but has not used it for quite some time. Recommend re-training.   Provided education to patient regarding her current level of functional and level of assist needed: she requires total assistance for LB dressing, max-total assist for toileting, max assist for sit to stand or toilet transfers. Patient lacks insight into her deficits and does not verbalize understanding regarding her need of 24 hr physical assistance at this time           PLAN :  Patient continues to benefit from skilled intervention to address the above impairments.  Continue treatment per established plan of care to address goals.    Recommendations for mobility with staff: Recommend that staff completes patient mobility with assist x2 using rolling walker and Camie Ip.    Recommendations for toileting with staff:  recommended toilet device: a bedside commode.       Recommend next OT session: AE training for LB ADL, transfer training     Recommendation for discharge: (in order for the patient to meet his/her long term goals):   Moderate intensity short-term skilled occupational therapy up to 5x/week    Other factors to consider for discharge: lives alone, high risk for falls, not safe to be alone, and concern for safely navigating or managing the home environment    IF patient discharges home will need the following DME: continuing to assess with progress       SUBJECTIVE:   Patient pleasant  and cooperative     OBJECTIVE DATA SUMMARY:   Cognitive/Behavioral Status:     Cognition  Safety Judgement: Impaired;Decreased awareness of need for assistance  Insights: Decreased awareness of deficits    Functional Mobility and Transfers for ADLs:  Bed Mobility:  Bed Mobility Training  Bed Mobility Training: No     Transfers:   Transfer Training  Transfer Training: Yes  Overall Level of Assistance: Substantial/Maximal assistance;Partial/Moderate assistance (multiple attempts to stand from recliner with arm  support)  Interventions: Safety awareness training;Manual cues;Verbal cues;Visual cues  Sit to Stand: Substantial/Maximal assistance  Stand to Sit: Minimal assistance           Balance:     Balance  Sitting: Impaired  Sitting - Static: Fair (occasional)  Sitting - Dynamic: Fair (occasional)  Standing: Impaired;With support  Standing - Static: Fair;Constant support  Standing - Dynamic: Fair;Constant support      ADL Intervention:                   Grooming: Minimal assistance;Increased time to complete   Grooming Skilled Clinical Factors: seated                            LE Dressing: Dependent/Total  LE Dressing Skilled Clinical Factors: unable to access feet by  any means attempted. educated verbally on adaptive equipment which she has used in the past . recommend trial                      Pain Rating:  3/10 back pain during ADL training  Pain Intervention(s):   rest and repositioning      Activity Tolerance:   requires frequent rest breaks  Please refer to the flowsheet for vital signs taken during this treatment.    After treatment:   Patient left in no apparent distress sitting up in chair, Call bell within reach, and Bed/ chair alarm activated    COMMUNICATION/EDUCATION:   The patient's plan of care was discussed with: physical therapist, registered nurse, and case manager    Patient Education  Education Given To: Patient  Education Provided: ADL Adaptive Strategies;Fall Prevention Strategies;Equipment  Education Method: Verbal  Education Outcome: Continued education needed    Thank you for this referral.  Devere Neighbors, OT  Minutes: [phone]

## 2024-08-22 NOTE — Care Coordination-Inpatient (Signed)
 TOC- [name] vs Home    CM noted that [name] has accepted this pt for SNF rehab. Per pt's nurse, pt is refusing to go to rehab. CM attempted to call pt's children,Erwin *[phone]) and Bernice [phone]) but neither answered their calls. CM left both of them a voice mail message earlier today. CM has not heard from either family member. Rock Skye, BSW,ACM-SW

## 2024-08-22 NOTE — Plan of Care (Signed)
 Problem: Skin/Tissue Integrity  Goal: Skin integrity remains intact  Description: 1.  Monitor for areas of redness and/or skin breakdown  2.  Assess vascular access sites hourly  3.  Every 4-6 hours minimum:  Change oxygen saturation probe site  4.  Every 4-6 hours:  If on nasal continuous positive airway pressure, respiratory therapy assess nares and determine need for appliance change or resting period  Outcome: Progressing  Flowsheets (Taken 08/22/2024 0900)  Skin Integrity Remains Intact: Monitor for areas of redness and/or skin breakdown     Problem: Safety - Adult  Goal: Free from fall injury  Outcome: Progressing     Problem: Pain  Goal: Verbalizes/displays adequate comfort level or baseline comfort level  Outcome: Progressing       08/22/2024 1223 by Annabel Augustin CROME, PT  Outcome: Progressing     Problem: Chronic Conditions and Co-morbidities  Goal: Patient's chronic conditions and co-morbidity symptoms are monitored and maintained or improved  Outcome: Progressing  Flowsheets (Taken 08/22/2024 0900)  Care Plan - Patient's Chronic Conditions and Co-Morbidity Symptoms are Monitored and Maintained or Improved:   Monitor and assess patient's chronic conditions and comorbid symptoms for stability, deterioration, or improvement   Collaborate with multidisciplinary team to address chronic and comorbid conditions and prevent exacerbation or deterioration   Update acute care plan with appropriate goals if chronic or comorbid symptoms are exacerbated and prevent overall improvement and discharge

## 2024-08-22 NOTE — Progress Notes (Addendum)
 [facility]                                                                               Hospitalist Progress Note  Jamie Divine, MD  Answering service: [phone] OR [phone] from in house phone        Date of Service:  08/22/2024  NAME:  Jamie Bryant  DOB:  [birth year]  MRN:  [id number]       Admission Summary:   Jamie Bryant is a 83 y.o. female with a pmhx DM II, gastroparesis, CKD III, past stroke, HTN, and dyslipidemia who presents via EMS for fall, and is being admitted for cellulitis.     In the ED, she was tachycardic to 101, and hypoxic to 89% on RA.  Other VSS.  Labs showed WBC 14.8, microcytic anemia with Hgb 8.5 (b/l 8-9), BUN 30, creatinine 1.04, CK 898. CXR and XR lumbar spine without acute abnormalities.     In the ED, she received robaxin , vancomycin , and tylenol .      Interval history / Subjective:   Follow up Cellulitis  No new issues  Saw the patient earlier  Refusing to go to SNF  Both myself and CM tried to reach the family but couldn't  The patient is a high risk of fall but she understands the risks      Assessment & Plan:     Sepsis 2/2 cellulitis, b/l LE wounds POA  -wound cx with MSSA and Pseudomonas, cont Cefepime , dc'd Vanc  -blood cultures NG  -leukocytosis resolved  -She was followed in wound care until March 2025 when she was lost to follow-up  -appreciate wound care  -pain mgmt     #Fall  -Endorses mechanical fall at her facility.  Denies loss of consciousness, or lightheadedness.  Lives in independent living section of her facility.  She has a son in the area who is currently out of town, but will be back over the weekend  - CT head, CT C-spine nothing acute  - PT/OT rec SNF     #DM2, A1c 7.8  #Gastroparesis  #Diabetic neuropathy  - hold home DDP 4   -ISS, Lantus      #Hypertension  - Continue home carvedilol  and Norvasc      #Dyslipidemia     #GERD  - PPI     #dementia  -supportive care    Prerenal azotemia  Slight bump in Cr  Elevated CPK  Mild  hyponatremia  -improved s/p IV NS bolus    Chronic anemia  Monitor, stable      Code status: Full Code No additional code details  Prophylaxis: Lovenox  ppx    Plan: Discharge tomorrow to home if persists on wanting to go home.  Care Plan discussed with: Patient/Family  Anticipated Disposition: home  Anticipated Discharge: tomorrow  Admission Status: Inpatient  Cardiac monitoring: None  Central Line:            Social Drivers of Health     Tobacco Use: Low Risk  (06/07/2024)    Patient History     Smoking Tobacco Use: Never     Smokeless Tobacco Use: Never  Passive Exposure: Never   Alcohol Use: Not At Risk (11/17/2023)    AUDIT-C     Frequency of Alcohol Consumption: Never     Average Number of Drinks: Patient does not drink     Frequency of Binge Drinking: Never   Financial Resource Strain: Medium Risk (11/17/2023)    Overall Financial Resource Strain (CARDIA)     Difficulty of Paying Living Expenses: Somewhat hard   Food Insecurity: No Food Insecurity (03/07/2024)    Hunger Vital Sign     Worried About Running Out of Food in the Last Year: Never true     Ran Out of Food in the Last Year: Never true   Transportation Needs: No Transportation Needs (03/07/2024)    PRAPARE - Therapist, art (Medical): No     Lack of Transportation (Non-Medical): No   Physical Activity: Insufficiently Active (11/17/2023)    Exercise Vital Sign     Days of Exercise per Week: 2 days     Minutes of Exercise per Session: 30 min   Stress: No Stress Concern Present (05/26/2023)    [employer] of Occupational Health - Occupational Stress Questionnaire     Feeling of Stress : Only a little   Social Connections: Unknown (05/26/2023)    Social Connection and Isolation Panel     Frequency of Communication with Friends and Family: Once a week     Frequency of Social Gatherings with Friends and Family: Once a week     Attends Religious Services: Never     Database administrator or Organizations: No     Attends Tax inspector Meetings: Never     Marital Status: Not on file   Recent Concern: Social Connections - Socially Isolated (05/26/2023)    Social Connection and Isolation Panel     Frequency of Communication with Friends and Family: Once a week     Frequency of Social Gatherings with Friends and Family: Once a week     Attends Religious Services: Never     Database administrator or Organizations: No     Attends Banker Meetings: Never     Marital Status: Divorced   Catering manager Violence: Not At Risk (05/26/2023)    Humiliation, Afraid, Rape, and Kick questionnaire     Fear of Current or Ex-Partner: No     Emotionally Abused: No     Physically Abused: No     Sexually Abused: No   Depression: Not at risk (06/07/2024)    PHQ-2     PHQ-2 Score: 0   Housing Stability: Low Risk  (03/07/2024)    Housing Stability Vital Sign     Unable to Pay for Housing in the Last Year: No     Number of Times Moved in the Last Year: 0     Homeless in the Last Year: No   Interpersonal Safety: Not At Risk (08/17/2024)    Interpersonal Safety Domain Source: IP Abuse Screening     Physical abuse: Denies     Verbal abuse: Denies     Emotional abuse: Denies     Financial abuse: Denies     Sexual abuse: Denies   Utilities: Not At Risk (03/07/2024)    [facility] Utilities     Threatened with loss of utilities: No       Review of Systems:   Pertinent items are noted in HPI.    Vital Signs:    Last 24hrs  VS reviewed since prior progress note. Most recent are:  Vitals:    08/22/24 1234   BP: (!) 110/44   Pulse:    Resp:    Temp:    SpO2:          Intake/Output Summary (Last 24 hours) at 08/22/2024 1745  Last data filed at 08/22/2024 0559  Gross per 24 hour   Intake --   Output 400 ml   Net -400 ml        Physical Examination:   I had a face to face encounter with this patient and independently examined them on 08/22/2024 as outlined below:    General: Alert, NAD  Resp:  CTA bilaterally, No accessory muscle use  CV:  RRR, 1/6 SM  GI:  Soft, Non  distended  Neurologic:  Alert and oriented, MAE  Extremities: B/l LE wrapped  Psych:  Flat affect, slow speaking    Data Review:   I have personally and independently reviewed all pertinent labs, diagnostic studies, imaging, and have provided independent interpretation of the same.    Labs:     Recent Labs     08/21/24  0520 08/22/24  0438   WBC 10.2 9.8   HGB 7.3* 8.1*   HCT 23.8* 27.0*   PLT 291 367     Recent Labs     08/20/24  0649 08/21/24  0520 08/22/24  0438   NA 135* 136 135*   K 4.4 4.4 6.6*   CL 100 104 102   CO2 23 23 30*   BUN 27* 25* 23   MG  --  2.3  --    PHOS  --  3.2  --      No results for input(s): ALT, TP, GLOB, GGT in the last 72 hours.    Invalid input(s): SGOT, GPT, AP, TBIL, TBILI, ALB, AML, AMYP, LPSE, HLPSE    No results for input(s): INR, APTT in the last 72 hours.    Invalid input(s): PTP   No results for input(s): TIBC in the last 72 hours.    Invalid input(s): FE, PSAT, FERR   No results found for: RBCF   No results for input(s): PH, PCO2, PO2 in the last 72 hours.  No results for input(s): CPK in the last 72 hours.    Invalid input(s): CPKMB, CKNDX, TROIQ  Lab Results   Component Value Date/Time    CHOL 150 06/09/2023 02:50 PM    HDL 52 06/09/2023 02:50 PM    LDL 71.2 06/09/2023 02:50 PM    LDL 119.4 06/10/2022 04:36 PM     No results found for: GLUCPOC  Lab Results   Component Value Date/Time    APPEARANCE CLEAR 08/17/2024 08:18 AM    COLORU YELLOW/STRAW 08/17/2024 08:18 AM    NITRU Negative 08/17/2024 08:18 AM    GLUCOSEU Negative 08/17/2024 08:18 AM    GLUCOSEU Negative 06/10/2022 04:36 PM    KETUA Negative 08/17/2024 08:18 AM    UROBILINOGEN 0.2 08/17/2024 08:18 AM    BILIRUBINUR Negative 08/17/2024 08:18 AM       Notes reviewed from all clinical/nonclinical/nursing services involved in patient's clinical care. Care coordination discussions were held with appropriate clinical/nonclinical/ nursing providers based on care  coordination needs.     Patients current active Medications were reviewed, considered, added and adjusted based on the clinical condition today.      Medications Reviewed:     Current Facility-Administered Medications   Medication Dose Route Frequency  diphenhydrAMINE -zinc  acetate (BENADRYL ) cream   Topical TID PRN    ceFEPIme  (MAXIPIME ) 2,000 mg in sodium chloride  0.9 % 100 mL IVPB (addEASE)  2,000 mg IntraVENous Q24H    HYDROmorphone  (DILAUDID ) injection 0.5 mg  0.5 mg IntraVENous Q3H PRN    oxyCODONE  (ROXICODONE ) immediate release tablet 5 mg  5 mg Oral Q4H PRN    Or    oxyCODONE  (ROXICODONE ) immediate release tablet 10 mg  10 mg Oral Q4H PRN    naloxone  (NARCAN ) injection 0.4 mg  0.4 mg IntraVENous PRN    sennosides-docusate sodium  (SENOKOT-S) 8.6-50 MG tablet 2 tablet  2 tablet Oral Daily    polyethylene glycol (GLYCOLAX ) packet 17 g  17 g Oral Daily    insulin  glargine (LANTUS ) injection vial 10 Units  10 Units SubCUTAneous Nightly    enoxaparin  (LOVENOX ) injection 40 mg  40 mg SubCUTAneous Daily    sodium chloride  flush 0.9 % injection 5-40 mL  5-40 mL IntraVENous 2 times per day    sodium chloride  flush 0.9 % injection 5-40 mL  5-40 mL IntraVENous PRN    0.9 % sodium chloride  infusion   IntraVENous PRN    magnesium  sulfate 2000 mg in 50 mL IVPB premix  2,000 mg IntraVENous PRN    ondansetron  (ZOFRAN -ODT) disintegrating tablet 4 mg  4 mg Oral Q8H PRN    Or    ondansetron  (ZOFRAN ) injection 4 mg  4 mg IntraVENous Q6H PRN    acetaminophen  (TYLENOL ) tablet 650 mg  650 mg Oral Q6H PRN    Or    acetaminophen  (TYLENOL ) suppository 650 mg  650 mg Rectal Q6H PRN    budesonide  (PULMICORT ) nebulizer suspension 250 mcg  0.25 mg Nebulization BID    pantoprazole  (PROTONIX ) tablet 40 mg  40 mg Oral QAM AC    pregabalin  (LYRICA ) capsule 150 mg  150 mg Oral BID    carvedilol  (COREG ) tablet 12.5 mg  12.5 mg Oral BID    amLODIPine  (NORVASC ) tablet 5 mg  5 mg Oral Daily    glucose chewable tablet 16 g  4 tablet Oral PRN     dextrose  bolus 10% 125 mL  125 mL IntraVENous PRN    Or    dextrose  bolus 10% 250 mL  250 mL IntraVENous PRN    glucagon  injection 1 mg  1 mg SubCUTAneous PRN    dextrose  10 % infusion   IntraVENous Continuous PRN    insulin  lispro (HUMALOG ,ADMELOG ) injection vial 0-4 Units  0-4 Units SubCUTAneous 4x Daily AC & HS     ______________________________________________________________________  EXPECTED LENGTH OF STAY: 5  ACTUAL LENGTH OF STAY:          5                 Jamie Divine, MD

## 2024-08-22 NOTE — Plan of Care (Signed)
 Problem: Physical Therapy - Adult  Goal: By Discharge: Performs mobility at highest level of function for planned discharge setting.  See evaluation for individualized goals.  Description: FUNCTIONAL STATUS PRIOR TO ADMISSION: Patient was modified independent using a rolling walker for functional mobility.    HOME SUPPORT PRIOR TO ADMISSION: The patient lived alone.    Physical Therapy Goals  Initiated 08/17/2024  1.  Patient will move from supine to sit and sit to supine in bed with moderate assistance within 7 day(s).    2.  Patient will perform sit to stand with moderate assistance within 7 day(s).  3.  Patient will transfer from bed to chair and chair to bed with moderate assistance using the least restrictive device within 7 day(s).  4.  Patient will ambulate with moderate assistance for 25 feet with the least restrictive device within 7 day(s).       Outcome: Progressing   PHYSICAL THERAPY TREATMENT    Patient: Jamie Bryant (83 y.o. female)  Date: 08/22/2024  Diagnosis: Cellulitis [L03.90]  Cellulitis of right lower extremity [L03.115]  Frequent falls [R29.6] Cellulitis      Precautions:        fall      ASSESSMENT:  Patient continues to benefit from skilled PT services and is slowly progressing towards goals. Pt was seen following RN clearance and informed pt was able to get to her chair with assist x1/OOB with RN. Pt reports planning to return home today, despite recommendations and POC for discharge to SNF until now. Pt demonstrates decreased insight into her deficits/the need for assistance/safety. She remains limited by generalized weakness, imbalance, BLE pain/edema, and overall functional mobility well below her reported baseline status. Pt was able to demonstrate transfers (sit<>stand) at recliner/RW with upwards of maxA and multiple failed attempts. She was able to walk briefly into the hallway and is c/o clicking and L hip/back pain. Noted lumbar XR negative though no CT/MRI for this area. Pt  left with all needs met and educated on concerns for her return home in current state. Pt reports she can call her son if needed, though he lives 30 minutes away and pt is unable to stand without significant physical assist.         PLAN:  Patient continues to benefit from skilled intervention to address the above impairments.  Continue treatment per established plan of care.      Recommendations for mobility with staff: Recommend that staff completes patient mobility with assist x1 using gait belt and rolling walker.    Recommendations for toileting with staff:  recommended toilet device: the bathroom.       Recommendation for discharge: (in order for the patient to meet his/her long term goals):   Moderate intensity short-term skilled physical therapy up to 5x/week    Other factors to consider for discharge: lives alone, no local support, available support system works or is unable to provide adequate supervision and the patient would be alone, patient's current support system is unable to meet their requirements for physical assistance, poor safety awareness, impaired cognition, high risk for falls, not safe to be alone, and concern for safely navigating or managing the home environment    IF patient discharges home will need the following DME: [facility] may provide more support for amb       SUBJECTIVE:   Patient stated, That's not gonna work [going to rehab]...    OBJECTIVE DATA SUMMARY:   Critical Behavior:   Calm, alert, appears  drowsy though oriented vs bradykinesia        Functional Mobility Training:  Bed Mobility:  Bed Mobility Training  Bed Mobility Training: No  Transfers:  Art therapist: Yes  Overall Level of Assistance: Substantial/Maximal assistance;Partial/Moderate assistance (multiple attempts to stand from recliner with arm support)  Interventions: Safety awareness training;Manual cues;Verbal cues;Visual cues  Sit to Stand: Substantial/Maximal assistance  Stand to Sit: Minimal  assistance  Balance:  Balance  Sitting: Impaired  Sitting - Static: Fair (occasional)  Sitting - Dynamic: Fair (occasional)  Standing: Impaired;With support  Standing - Static: Fair;Constant support  Standing - Dynamic: Fair;Constant support   Ambulation/Gait Training:  Gait  Gait Training: Yes  Overall Level of Assistance: Minimal assistance  Distance (ft): 15 Feet (x2 with standing rest breaks)  Assistive Device: Walker, rolling;Gait belt  Interventions: Safety awareness training;Verbal cues  Base of Support: Widened  Speed/Cadence: Shuffled;Pace decreased (< 100 feet/min)  Step Length: Right shortened;Left shortened  Gait Abnormalities: Decreased step clearance (trunk flexed/fwd head/rounded shoulders- cues for postural extension)  Pain Rating:  NT    Activity Tolerance:   Fair , requires rest breaks, and requires frequent rest breaks    After treatment:   Patient left in no apparent distress sitting up in chair, Call bell within reach, Bed/ chair alarm activated, and pt deferred BLE elevation for now (despite edema and wounds)      COMMUNICATION/EDUCATION:   The patient's plan of care was discussed with: registered nurse and case manager    Patient Education  Education Given To: Patient  Education Provided: Role of Therapy;Plan of Care;Fall Prevention Strategies;Mobility Training;Equipment;Orientation;Transfer Training  Education Provided Comments: Educated pt on safety concerns for her desire to return home (alone) upon discharge today- pt remains adamant- discussed with CM  Education Method: Demonstration;Verbal  Barriers to Learning: Readiness to Learn  Education Outcome: Verbalized understanding;Continued education needed      Sammy Douthitt L Kash Davie, PT  Minutes: 34

## 2024-08-23 LAB — POCT GLUCOSE
POC Glucose: 128 mg/dL — ABNORMAL HIGH (ref 65–117)
POC Glucose: 159 mg/dL — ABNORMAL HIGH (ref 65–117)
POC Glucose: 149 mg/dL — ABNORMAL HIGH (ref 65–117)

## 2024-08-23 MED ORDER — CEFUROXIME AXETIL 500 MG PO TABS
500 | ORAL_TABLET | Freq: Two times a day (BID) | ORAL | 0 refills | Status: AC
Start: 2024-08-23 — End: 2024-08-30

## 2024-08-23 MED FILL — PULMICORT 0.25 MG/2ML IN SUSP: 0.25 MG/2ML | RESPIRATORY_TRACT | Qty: 2

## 2024-08-23 MED FILL — AMLODIPINE BESYLATE 5 MG PO TABS: 5 mg | ORAL | Qty: 1

## 2024-08-23 MED FILL — CEFEPIME HCL 2 G IV SOLR: 2 g | INTRAVENOUS | Qty: 2

## 2024-08-23 MED FILL — CARVEDILOL 12.5 MG PO TABS: 12.5 mg | ORAL | Qty: 1

## 2024-08-23 MED FILL — HYDROMORPHONE HCL 1 MG/ML IJ SOLN: 1 mg/mL | INTRAMUSCULAR | Qty: 0.5

## 2024-08-23 MED FILL — LANTUS 100 UNIT/ML SC SOLN: 100 [IU]/mL | SUBCUTANEOUS | Qty: 10

## 2024-08-23 MED FILL — MIRALAX MIX-IN PAX 17 G PO PACK: 17 g | ORAL | Qty: 1

## 2024-08-23 MED FILL — PREGABALIN 75 MG PO CAPS: 75 mg | ORAL | Qty: 2

## 2024-08-23 MED FILL — PANTOPRAZOLE SODIUM 40 MG PO TBEC: 40 mg | ORAL | Qty: 1

## 2024-08-23 MED FILL — BANOPHEN 2-0.1 % EX CREA: 2-0.1 % | CUTANEOUS | Qty: 1

## 2024-08-23 MED FILL — BENADRYL ITCH STOPPING 1-0.1 % EX CREA: 1-0.1 % | CUTANEOUS | Qty: 1

## 2024-08-23 MED FILL — ENOXAPARIN SODIUM 40 MG/0.4ML IJ SOSY: 40 MG/0.4ML | INTRAMUSCULAR | Qty: 0.4

## 2024-08-23 MED FILL — SENNA PLUS 8.6-50 MG PO TABS: 8.6-50 mg | ORAL | Qty: 2

## 2024-08-23 NOTE — Plan of Care (Signed)
 Problem: Chronic Conditions and Co-morbidities  Goal: Patient's chronic conditions and co-morbidity symptoms are monitored and maintained or improved  08/23/2024 1609 by Erika Glass, RN  Outcome: Progressing  08/23/2024 0638 by Joshua Harts, RN  Outcome: Progressing     Problem: Skin/Tissue Integrity  Goal: Skin integrity remains intact  Description: 1.  Monitor for areas of redness and/or skin breakdown  2.  Assess vascular access sites hourly  3.  Every 4-6 hours minimum:  Change oxygen saturation probe site  4.  Every 4-6 hours:  If on nasal continuous positive airway pressure, respiratory therapy assess nares and determine need for appliance change or resting period  08/23/2024 1609 by Erika Glass, RN  Outcome: Progressing  08/23/2024 0638 by Joshua Harts, RN  Outcome: Not Progressing     Problem: Safety - Adult  Goal: Free from fall injury  08/23/2024 1609 by Erika Glass, RN  Outcome: Progressing  08/23/2024 0638 by Joshua Harts, RN  Outcome: Progressing     Problem: Pain  Goal: Verbalizes/displays adequate comfort level or baseline comfort level  08/23/2024 1609 by Erika Glass, RN  Outcome: Progressing  08/23/2024 0638 by Joshua Harts, RN  Outcome: Progressing

## 2024-08-23 NOTE — Discharge Summary (Signed)
 Discharge Summary       PATIENT ID: Jamie Bryant  MRN: [id number]   DATE OF BIRTH: [birth year]    DATE OF ADMISSION: 08/17/2024  6:21 AM    DATE OF DISCHARGE: 08/23/2024   PRIMARY CARE PROVIDER: Carolynn Hastings, NP-C     ATTENDING PHYSICIAN: Dr Savannah Divine  DISCHARGING PROVIDER: Savannah Divine, MD    To contact this individual call [phone] and ask the operator to page.  If unavailable ask to be transferred the Adult Hospitalist Department.    CONSULTATIONS: IP CONSULT TO GERIATRICS  IP CONSULT TO PHARMACY  IP CONSULT TO CASE MANAGEMENT  IP CONSULT TO CASE MANAGEMENT  IP CONSULT TO CASE MANAGEMENT    PROCEDURES/SURGERIES: * No surgery found *    ADMITTING DIAGNOSES & HOSPITAL COURSE:   Sepsis 2/2 cellulitis, b/l LE wounds POA  -wound cx with MSSA and Pseudomonas, cont Cefepime , dc'd Vanc  -blood cultures NG  -leukocytosis resolved  -She was followed in wound care until March 2025 when she was lost to follow-up  -appreciate wound care  -pain mgmt     #Fall  -Endorses mechanical fall at her facility.  Denies loss of consciousness, or lightheadedness.  Lives in independent living section of her facility.  She has a son in the area who is currently out of town, but will be back over the weekend  - CT head, CT C-spine nothing acute  - PT/OT rec SNF     #DM2/Gastroparesis/Diabetic neuropathy: ISS, Lantus   #Hypertension: Continue home carvedilol  and Norvasc   #Dyslipidemia   #GERD: PPI  #dementia: supportive care    Prerenal azotemia  Slight bump in Cr  Elevated CPK  Mild hyponatremia  -improved s/p IV NS bolus    Chronic anemia  Monitor, stable        PENDING TEST RESULTS:   At the time of discharge the following test results are still pending: none    FOLLOW UP APPOINTMENTS:    PCP    ADDITIONAL CARE RECOMMENDATIONS: as above    DIET: diabetic diet    ACTIVITY: activity as tolerated      DISCHARGE MEDICATIONS:     Medication List        START taking these medications      cefUROXime  500 MG tablet  Commonly known as:  CEFTIN   Take 1 tablet by mouth 2 times daily for 7 days            CHANGE how you take these medications      hydrocortisone  2.5 % cream  Apply topically with each change of dressing  What changed: Another medication with the same name was removed. Continue taking this medication, and follow the directions you see here.            CONTINUE taking these medications      albuterol  (2.5 MG/3ML) 0.083% nebulizer solution  Commonly known as: PROVENTIL   Take 3 mLs by nebulization 4 times daily     albuterol -ipratropium 20-100 MCG/ACT Aers inhaler  Commonly known as: COMBIVENT RESPIMAT  Inhale 1 puff into the lungs every 6 hours     amLODIPine  5 MG tablet  Commonly known as: NORVASC   Take 1 tablet by mouth daily     aspirin  81 MG chewable tablet  Take 1 tablet by mouth daily     beclomethasone 40 MCG/ACT Aerb inhaler  Commonly known as: Qvar RediHaler  Inhale 1 puff into the lungs in the morning and 1 puff in the evening.  budesonide  0.25 MG/2ML nebulizer suspension  Commonly known as: Pulmicort   Take 2 mLs by nebulization 2 times daily     carvedilol  12.5 MG tablet  Commonly known as: COREG      * FreeStyle Libre 2 Sensor Misc  Blood sugar checks before meals and at bedtime and as needed     * FreeStyle Libre 3 Plus Sensor Misc  Blood sugar checks before meals and at bedtime and as needed     [employer] 3 Reader Devi  Blood sugar checks before meals and at bedtime and as needed     insulin  glargine 100 UNIT/ML injection vial  Commonly known as: LANTUS   Inject 14 Units into the skin nightly     insulin  lispro 100 UNIT/ML Soln injection vial  Commonly known as: HUMALOG ,ADMELOG   Inject 0-4 Units into the skin 4 times daily (before meals and nightly)     ipratropium 17 MCG/ACT inhaler  Commonly known as: ATROVENT HFA     Linzess  290 MCG Caps capsule  Generic drug: linaclotide   Take 1 capsule by mouth every morning (before breakfast)     omeprazole  40 MG delayed release capsule  Commonly known as: PRILOSEC  Take 1  capsule by mouth every morning (before breakfast)     pregabalin  300 MG capsule  Commonly known as: Lyrica   Take 1 capsule by mouth 2 times daily for 364 days. DAW BRAND NAME ONLY Max Daily Amount: 600 mg     quinapril  40 MG tablet  Commonly known as: ACCUPRIL   quinapril  40 mg tablet   TAKE 1 TABLET BY MOUTH EVERY NIGHT FOR HIGH BLOOD PRESSURE     Repatha  SureClick Soaj pen  Generic drug: Evolocumab   INJECT AS DIRECTED EVERY 2 WEEKS     SITagliptin  100 MG tablet  Commonly known as: Januvia   Take 1 tablet by mouth daily     torsemide  20 MG tablet  Commonly known as: DEMADEX   Take 5 tablets by mouth daily           * This list has 2 medication(s) that are the same as other medications prescribed for you. Read the directions carefully, and ask your doctor or other care provider to review them with you.                STOP taking these medications      linezolid  600 MG tablet  Commonly known as: ZYVOX      lisinopril  40 MG tablet  Commonly known as: PRINIVIL ;ZESTRIL      olopatadine 0.1 % ophthalmic solution  Commonly known as: PATANOL     QUEtiapine  25 MG tablet  Commonly known as: SEROQUEL                Where to Get Your Medications        These medications were sent to [id number] [place] - [address] - [address] [phone] [address] [facility] [id number]  [address], [place] [address]      Phone: [phone]   cefUROXime  500 MG tablet           NOTIFY YOUR PHYSICIAN FOR ANY OF THE FOLLOWING:   Fever over 101 degrees for 24 hours.   Chest pain, shortness of breath, fever, chills, nausea, vomiting, diarrhea, change in mentation, falling, weakness, bleeding. Severe pain or pain not relieved by medications.  Or, any other signs or symptoms that you may have questions about.    DISPOSITION:   x Home With:   OT  PT  HH  RN       Long term SNF/Inpatient Rehab    Independent/assisted living    Hospice    Other:       PATIENT CONDITION AT DISCHARGE:     Functional status    Poor     Deconditioned     x Independent      Cognition    x Lucid     Forgetful     Dementia      Catheters/lines (plus indication)    Foley     PICC     PEG    x None      Code status    x Full code     DNR      PHYSICAL EXAMINATION AT DISCHARGE:    General : alert x 3, awake, no acute distress,   HEENT: PEERL, EOMI, moist mucus membrane, TM clear  Neck: supple, no JVD, no meningeal signs  Chest: Clear to auscultation bilaterally   CVS: S1 S2 heard, Capillary refill less than 2 seconds  Abd: soft/ Non tender, non distended, BS physiological,   Ext: no clubbing, no cyanosis, no edema, brisk 2+ DP pulses  Neuro/Psych: non focal  Skin: warm     CHRONIC MEDICAL DIAGNOSES:      Greater than 38 minutes were spent with the patient on counseling and coordination of care    Signed:   Savannah Divine, MD  08/23/2024  2:46 PM

## 2024-08-23 NOTE — Plan of Care (Signed)
 Problem: Physical Therapy - Adult  Goal: By Discharge: Performs mobility at highest level of function for planned discharge setting.  See evaluation for individualized goals.  Description: FUNCTIONAL STATUS PRIOR TO ADMISSION: Patient was modified independent using a rolling walker for functional mobility.    HOME SUPPORT PRIOR TO ADMISSION: The patient lived alone.    Physical Therapy Goals  Initiated 08/17/2024  1.  Patient will move from supine to sit and sit to supine in bed with moderate assistance within 7 day(s).    2.  Patient will perform sit to stand with moderate assistance within 7 day(s).  3.  Patient will transfer from bed to chair and chair to bed with moderate assistance using the least restrictive device within 7 day(s).  4.  Patient will ambulate with moderate assistance for 25 feet with the least restrictive device within 7 day(s).   Outcome: Progressing  PHYSICAL THERAPY TREATMENT    Patient: Jamie Bryant (83 y.o. female)  Date: 08/23/2024  Diagnosis: Cellulitis [L03.90]  Cellulitis of right lower extremity [L03.115]  Frequent falls [R29.6] Cellulitis      Precautions: Fall      ASSESSMENT:  Patient continues to benefit from skilled PT services and is slowly progressing towards goals. Pt presents with impaired cognition, poor insight into deficits, poor safety awareness, weakness, impaired balance, unsteady gait, decreased activity tolerance, and overall decline in functional mobility.  Pt required cuing for initiation and additional time for all functional mobility.  Transferred supine>sit with modA, sit<>stand with minA, and took a few steps bed>chair with minA using RW. Performed additional sit<>stand transfers from the chair and working on standing tolerance with unilateral support on RW.         Pt is not safe to return home to her ILF as she lives alone and currently cannot perform mobility and ADLs without assist of another person. Continue to recommend moderate intensity rehab. If this  is not possible, pt will need increased assist/support from family and would benefit from an aide.        PLAN:  Patient continues to benefit from skilled intervention to address the above impairments.  Continue treatment per established plan of care.      Recommendations for mobility with staff: Recommend that staff completes patient mobility with assist x1 using gait belt and rolling walker.    Recommendations for toileting with staff:  recommended toilet device: a bedside commode.       Recommendation for discharge: (in order for the patient to meet his/her long term goals):   Moderate intensity short-term skilled physical therapy up to 5x/week  Pt unsafe to d/c home alone as she required physical assistance for all functional mobility    Other factors to consider for discharge: lives alone, poor insight into deficits, baseline dementia, fall risk    IF patient discharges home will need the following DME: patient owns DME required for discharge       SUBJECTIVE:   Patient stated, I don't want my breakfast to get cold.    OBJECTIVE DATA SUMMARY:   Critical Behavior:  Orientation  Overall Orientation Status: Within Normal Limits  Orientation Level: Oriented X4  Cognition  Overall Cognitive Status: Exceptions  Arousal/Alertness: Appears intact  Following Commands: Follows one step commands with increased time;Follows one step commands with repetition  Attention Span: Appears intact  Memory: Appears intact  Safety Judgement: Decreased awareness of need for assistance;Decreased awareness of need for safety  Problem Solving: Impaired  Insights: Decreased awareness  of deficits  Initiation: Requires cues for some    Functional Mobility Training:  Bed Mobility:  Bed Mobility Training  Bed Mobility Training: Yes  Interventions: Tactile cues;Verbal cues  Supine to Sit: Partial/Moderate assistance  Scooting: Partial/Moderate assistance  Transfers:  Transfer Training  Interventions: Manual cues;Tactile cues;Verbal cues  Sit  to Stand: Partial/Moderate assistance  Stand to Sit: Minimal assistance  Bed to Chair: Minimal assistance  Balance:  Balance  Sitting: Impaired  Sitting - Static: Fair (occasional)  Sitting - Dynamic: Fair (occasional)  Standing: Impaired;With support  Standing - Static: Constant support;Fair  Standing - Dynamic: Fair   Ambulation/Gait Training:  Gait  Overall Level of Assistance: Minimal assistance  Distance (ft): 5 Feet  Assistive Device: Walker, rolling;Gait belt  Interventions: Safety awareness training;Verbal cues  Base of Support: Widened  Speed/Cadence: Slow;Shuffled  Step Length: Right shortened;Left shortened  Gait Abnormalities: Decreased step clearance;Shuffling gait;Trunk sway increased    Activity Tolerance:   Poor    After treatment:   Patient left in no apparent distress sitting up in chair, Call bell within reach, and Bed/ chair alarm activated      COMMUNICATION/EDUCATION:   The patient's plan of care was discussed with: occupational therapist and registered nurse      Delon LITTIE Brooking, PT, DPT  Minutes: [phone]

## 2024-08-23 NOTE — Discharge Instructions (Signed)
 Discharge Instructions       PATIENT ID: Jamie Bryant  MRN: [id number]   DATE OF BIRTH: 1941/01/10    DATE OF ADMISSION: @ADMITDTTM @    DATE OF DISCHARGE: 08/23/2024    PRIMARY CARE PROVIDER: @PCP @     ATTENDING PHYSICIAN: @ATTPROV @  DISCHARGING PROVIDER: Savannah Divine, MD    To contact this individual call [phone] and ask the operator to page.   If unavailable ask to be transferred the Adult Hospitalist Department.    DISCHARGE DIAGNOSES Fall/Sepsis    CONSULTATIONS: none    PROCEDURES/SURGERIES: * No surgery found *    PENDING TEST RESULTS:   At the time of discharge the following test results are still pending: none    FOLLOW UP APPOINTMENTS:   PCP    ADDITIONAL CARE RECOMMENDATIONS: as above    DIET: diabetic diet    ACTIVITY: activity as tolerated      DISCHARGE MEDICATIONS:   See Medication Reconciliation Form    It is important that you take the medication exactly as they are prescribed.   Keep your medication in the bottles provided by the pharmacist and keep a list of the medication names, dosages, and times to be taken in your wallet.   Do not take other medications without consulting your doctor.       NOTIFY YOUR PHYSICIAN FOR ANY OF THE FOLLOWING:   Fever over 101 degrees for 24 hours.   Chest pain, shortness of breath, fever, chills, nausea, vomiting, diarrhea, change in mentation, falling, weakness, bleeding. Severe pain or pain not relieved by medications.  Or, any other signs or symptoms that you may have questions about.      DISPOSITION:   x Home With:   OT  PT x HH  RN       SNF/Inpatient Rehab/LTAC    Independent/assisted living    Hospice    Other:     CDMP Checked:   Yes x     PROBLEM LIST Updated:  Yes x       Signed:   Savannah Divine, MD  08/23/2024  2:45 PM

## 2024-08-23 NOTE — Progress Notes (Signed)
 Patient discharged to home with Home Health. Discharge instructions, medication regimen, and follow-up appointments reviewed with patient. She verbalized understanding. IV removed and belongings packed with self. Patient discharged via stretcher.

## 2024-08-23 NOTE — Care Coordination-Inpatient (Signed)
 CM notified that patient is stable for discharge and refusing SNF.  [name] accepted for SNF.  CM left messages for both of patient's daughters to discuss disposition.    CM to attempt to call again later.    Kathlyne Meeker [phone]      Rayfield Downs, RN/CRM  [phone]

## 2024-08-23 NOTE — H&P (Signed)
 CM called APS to make a report as patient was unsafe to return home alone.    APS # [id number]      Rayfield Downs, RN/CRM  [phone]

## 2024-08-23 NOTE — Plan of Care (Signed)
 Problem: Occupational Therapy - Adult  Goal: By Discharge: Performs self-care activities at highest level of function for planned discharge setting.  See evaluation for individualized goals.  Description: FUNCTIONAL STATUS PRIOR TO ADMISSION:  Patient was ambulatory using RW   , Prior Level of Assist for ADLs: Independent,  ,  ,  ,  ,  , Prior Level of Assist for Homemaking: Independent,  , Prior Level of Assist for Transfers: Independent, Active Driver: No     HOME SUPPORT: Patient lived alone at [facility] and reports independence with ADLS/IADLS .    Occupational Therapy Goals:  Initiated 08/17/2024  1.  Patient will perform grooming with Minimal Assist in sitting within 7 day(s).  2.  Patient will perform bathing with Maximal Assist within 7 day(s).  3.  Patient will perform lower body dressing with Maximal Assist within 7 day(s).  4.  Patient will perform toilet transfers with Maximal Assist  within 7 day(s).  5.  Patient will perform all aspects of toileting with Moderate Assist within 7 day(s).  6.  Patient will participate in upper extremity therapeutic exercise/activities with Moderate Assist for 5 minutes within 7 day(s).    7.  Patient will utilize energy conservation techniques during functional activities with verbal cues within 7 day(s).   Outcome: Not Progressing    OCCUPATIONAL THERAPY TREATMENT  Patient: Jamie Bryant (83 y.o. female)  Date: 08/23/2024  Primary Diagnosis: Cellulitis [L03.90]  Cellulitis of right lower extremity [L03.115]  Frequent falls [R29.6]       Precautions:                  Chart, occupational therapy assessment, plan of care, and goals were reviewed.    ASSESSMENT  Patient continues to benefit from skilled OT services and is slowly progressing towards goals. Pt continues to require increased assist and additional time for bed mobility, sit to stand and all ADLs. Pt with slight improvement today with physical assist level for supine to sit and sit<>stands. However, she  remains limited by decreased activity tolerance, ?confusion and decreased insight into deficits. Pt is a fall risk. Pt needed up to mod A for sit to stand this date, performed standing ADL with min A for balance with unilateral support on RW and total A to don socks.    Pt is not safe to return home to her ILF as she lives alone and currently cannot perform mobility and ADLs without assist of another person. Continue to recommend moderate intensity rehab. If this is not possible, pt will need increased assist/support from family and would benefit from an aide.              PLAN :  Patient continues to benefit from skilled intervention to address the above impairments.  Continue treatment per established plan of care to address goals.    Recommendations for mobility with staff: Recommend that staff completes patient mobility with assist x1 using gait belt and rolling walker.    Recommendations for toileting with staff:  recommended toilet device: a bedside commode with staff assist x1 using  gait belt and rolling walker.       Recommend next OT session: ADLs, attempt bathroom mobility as able    Recommendation for discharge: (in order for the patient to meet his/her long term goals):   Moderate intensity short-term skilled occupational therapy up to 5x/week    Other factors to consider for discharge: lives alone, poor safety awareness, impaired cognition, high risk for  falls, and not safe to be alone    IF patient discharges home will need the following DME: TBD       SUBJECTIVE:   Patient stated "I sleep in a recliner at home."    OBJECTIVE DATA SUMMARY:   Cognitive/Behavioral Status:  Orientation  Overall Orientation Status: Within Normal Limits  Orientation Level: Oriented X4  Cognition  Overall Cognitive Status: Exceptions  Arousal/Alertness: Appears intact  Following Commands: Follows one step commands with increased time;Follows one step commands with repetition  Attention Span: Appears intact  Memory: Appears  intact  Safety Judgement: Decreased awareness of need for assistance;Decreased awareness of need for safety  Problem Solving: Impaired  Insights: Decreased awareness of deficits  Initiation: Requires cues for some    Functional Mobility and Transfers for ADLs:  Bed Mobility:  Bed Mobility Training  Bed Mobility Training: Yes  Interventions: Tactile cues;Verbal cues  Supine to Sit: Partial/Moderate assistance     Transfers:   Transfer Training  Interventions: Manual cues;Tactile cues;Verbal cues  Sit to Stand: Partial/Moderate assistance  Stand to Sit: Minimal assistance  Bed to Chair: Minimal assistance           Balance:     Balance  Sitting: Impaired  Sitting - Static: Fair (occasional)  Sitting - Dynamic: Fair (occasional)  Standing: Impaired;With support  Standing - Static: Constant support;Fair  Standing - Dynamic: Fair      ADL Intervention:     Verbal, tactile and manual cues provided throughout session for hand placement, body mechanics and safety during functional mobility and bed training. Pt with use of RW this date to transfer OOB to chair and for standing ADLs.               Grooming: Minimal assistance   Grooming Skilled Clinical Factors: standing with RW, fair balance with unilateral RW support                            LE Dressing: Dependent/Total  LE Dressing Skilled Clinical Factors: to don socks       Toileting Skilled Clinical Factors: declined standing hygiene as pt preferred to eat breakfast. pt perseverative on skin irritation from brief. OT offered to assist pt with donning new brief and posterior hygiene. pt declined stating she would do so after breakfast                      Pain Rating:  0/10   Pain Intervention(s):         Activity Tolerance:   Good  Please refer to the flowsheet for vital signs taken during this treatment.    After treatment:   Patient left in no apparent distress sitting up in chair, Call bell within reach, and Bed/ chair alarm activated    COMMUNICATION/EDUCATION:    The patient's plan of care was discussed with: physical therapist and registered nurse    Patient Education  Education Given To: Patient  Education Provided: Role of Therapy;Plan of Microbiologist  Education Method: Verbal  Barriers to Learning: Readiness to Learn;Cognition  Education Outcome: Continued education needed    Thank you for this referral.  Benton CHRISTELLA Drop, OT  Minutes: 27

## 2024-08-23 NOTE — Care Coordination-Inpatient (Signed)
 Patient is being discharged home.  CM spoke to patient's son Kathlyne and daughter, Bernice.  Patient does not have a key, however patient's son is going to patient's apartment to unlock it for her.    [facility] scheduled for 5:30pm.    HH set up with [facility].  AVS updated.    Jathen Sudano, RN/CRM  [phone]

## 2024-08-23 NOTE — Progress Notes (Signed)
 [facility]                                                                               Hospitalist Progress Note  Savannah Divine, MD  Answering service: [phone] OR [phone] from in house phone        Date of Service:  08/23/2024  NAME:  Jamie Bryant  DOB:  17-Jun-1941  MRN:  [id number]       Admission Summary:   Jamie Bryant is a 83 y.o. female with a pmhx DM II, gastroparesis, CKD III, past stroke, HTN, and dyslipidemia who presents via EMS for fall, and is being admitted for cellulitis.     In the ED, she was tachycardic to 101, and hypoxic to 89% on RA.  Other VSS.  Labs showed WBC 14.8, microcytic anemia with Hgb 8.5 (b/l 8-9), BUN 30, creatinine 1.04, CK 898. CXR and XR lumbar spine without acute abnormalities.     In the ED, she received robaxin , vancomycin , and tylenol .      Interval history / Subjective:   Follow up Cellulitis  No new issues  Saw the patient earlier  Refusing to go to SNF  Both myself and CM tried to reach the family but couldn't  The patient is a high risk of fall but she understands the risks      Assessment & Plan:     Sepsis 2/2 cellulitis, b/l LE wounds POA  -wound cx with MSSA and Pseudomonas, cont Cefepime , dc'd Vanc  -blood cultures NG  -leukocytosis resolved  -She was followed in wound care until March 2025 when she was lost to follow-up  -appreciate wound care  -pain mgmt     #Fall  -Endorses mechanical fall at her facility.  Denies loss of consciousness, or lightheadedness.  Lives in independent living section of her facility.  She has a son in the area who is currently out of town, but will be back over the weekend  - CT head, CT C-spine nothing acute  - PT/OT rec SNF     #DM2/Gastroparesis/Diabetic neuropathy: ISS, Lantus   #Hypertension: Continue home carvedilol  and Norvasc   #Dyslipidemia   #GERD: PPI  #dementia: supportive care    Prerenal azotemia  Slight bump in Cr  Elevated CPK  Mild hyponatremia  -improved s/p IV NS bolus    Chronic  anemia  Monitor, stable      Code status: Full Code No additional code details  Prophylaxis: Lovenox  ppx    Plan: Discharge to home today. The patient is a high risk of falls and she understands.  Care Plan discussed with: Patient/Family  Anticipated Disposition: home today  Anticipated Discharge: today  Admission Status: Inpatient  Cardiac monitoring: None  Central Line:            Social Drivers of Health     Tobacco Use: Low Risk  (06/07/2024)    Patient History     Smoking Tobacco Use: Never     Smokeless Tobacco Use: Never     Passive Exposure: Never   Alcohol Use: Not At Risk (11/17/2023)    AUDIT-C     Frequency of Alcohol Consumption: Never  Average Number of Drinks: Patient does not drink     Frequency of Binge Drinking: Never   Financial Resource Strain: Medium Risk (11/17/2023)    Overall Financial Resource Strain (CARDIA)     Difficulty of Paying Living Expenses: Somewhat hard   Food Insecurity: No Food Insecurity (03/07/2024)    Hunger Vital Sign     Worried About Running Out of Food in the Last Year: Never true     Ran Out of Food in the Last Year: Never true   Transportation Needs: No Transportation Needs (03/07/2024)    PRAPARE - Therapist, art (Medical): No     Lack of Transportation (Non-Medical): No   Physical Activity: Insufficiently Active (11/17/2023)    Exercise Vital Sign     Days of Exercise per Week: 2 days     Minutes of Exercise per Session: 30 min   Stress: No Stress Concern Present (05/26/2023)    [employer] of Occupational Health - Occupational Stress Questionnaire     Feeling of Stress : Only a little   Social Connections: Unknown (05/26/2023)    Social Connection and Isolation Panel     Frequency of Communication with Friends and Family: Once a week     Frequency of Social Gatherings with Friends and Family: Once a week     Attends Religious Services: Never     Database administrator or Organizations: No     Attends Banker Meetings: Never      Marital Status: Not on file   Recent Concern: Social Connections - Socially Isolated (05/26/2023)    Social Connection and Isolation Panel     Frequency of Communication with Friends and Family: Once a week     Frequency of Social Gatherings with Friends and Family: Once a week     Attends Religious Services: Never     Database administrator or Organizations: No     Attends Banker Meetings: Never     Marital Status: Divorced   Catering manager Violence: Not At Risk (05/26/2023)    Humiliation, Afraid, Rape, and Kick questionnaire     Fear of Current or Ex-Partner: No     Emotionally Abused: No     Physically Abused: No     Sexually Abused: No   Depression: Not at risk (06/07/2024)    PHQ-2     PHQ-2 Score: 0   Housing Stability: Low Risk  (03/07/2024)    Housing Stability Vital Sign     Unable to Pay for Housing in the Last Year: No     Number of Times Moved in the Last Year: 0     Homeless in the Last Year: No   Interpersonal Safety: Not At Risk (08/17/2024)    Interpersonal Safety Domain Source: IP Abuse Screening     Physical abuse: Denies     Verbal abuse: Denies     Emotional abuse: Denies     Financial abuse: Denies     Sexual abuse: Denies   Utilities: Not At Risk (03/07/2024)    [facility] Utilities     Threatened with loss of utilities: No       Review of Systems:   Pertinent items are noted in HPI.    Vital Signs:    Last 24hrs VS reviewed since prior progress note. Most recent are:  Vitals:    08/23/24 0904   BP: (!) 132/55   Pulse: 76   Resp:  Temp:    SpO2:        No intake or output data in the 24 hours ending 08/23/24 1437       Physical Examination:   I had a face to face encounter with this patient and independently examined them on 08/23/2024 as outlined below:    General: Alert, NAD  Resp:  CTA bilaterally, No accessory muscle use  CV:  RRR, 1/6 SM  GI:  Soft, Non distended  Neurologic:  Alert and oriented, MAE  Extremities: B/l LE wrapped  Psych:  Flat affect, slow speaking    Data Review:    I have personally and independently reviewed all pertinent labs, diagnostic studies, imaging, and have provided independent interpretation of the same.    Labs:     Recent Labs     08/21/24  0520 08/22/24  0438   WBC 10.2 9.8   HGB 7.3* 8.1*   HCT 23.8* 27.0*   PLT 291 367     Recent Labs     08/21/24  0520 08/22/24  0438   NA 136 135*   K 4.4 6.6*   CL 104 102   CO2 23 30*   BUN 25* 23   MG 2.3  --    PHOS 3.2  --      No results for input(s): ALT, TP, GLOB, GGT in the last 72 hours.    Invalid input(s): SGOT, GPT, AP, TBIL, TBILI, ALB, AML, AMYP, LPSE, HLPSE    No results for input(s): INR, APTT in the last 72 hours.    Invalid input(s): PTP   No results for input(s): TIBC in the last 72 hours.    Invalid input(s): FE, PSAT, FERR   No results found for: RBCF   No results for input(s): PH, PCO2, PO2 in the last 72 hours.  No results for input(s): CPK in the last 72 hours.    Invalid input(s): CPKMB, CKNDX, TROIQ  Lab Results   Component Value Date/Time    CHOL 150 06/09/2023 02:50 PM    HDL 52 06/09/2023 02:50 PM    LDL 71.2 06/09/2023 02:50 PM    LDL 119.4 06/10/2022 04:36 PM     No results found for: GLUCPOC  Lab Results   Component Value Date/Time    APPEARANCE CLEAR 08/17/2024 08:18 AM    COLORU YELLOW/STRAW 08/17/2024 08:18 AM    NITRU Negative 08/17/2024 08:18 AM    GLUCOSEU Negative 08/17/2024 08:18 AM    GLUCOSEU Negative 06/10/2022 04:36 PM    KETUA Negative 08/17/2024 08:18 AM    UROBILINOGEN 0.2 08/17/2024 08:18 AM    BILIRUBINUR Negative 08/17/2024 08:18 AM       Notes reviewed from all clinical/nonclinical/nursing services involved in patient's clinical care. Care coordination discussions were held with appropriate clinical/nonclinical/ nursing providers based on care coordination needs.     Patients current active Medications were reviewed, considered, added and adjusted based on the clinical condition today.      Medications Reviewed:      Current Facility-Administered Medications   Medication Dose Route Frequency    diphenhydrAMINE -zinc  acetate 2-0.1 % cream   Topical TID PRN    ceFEPIme  (MAXIPIME ) 2,000 mg in sodium chloride  0.9 % 100 mL IVPB (addEASE)  2,000 mg IntraVENous Q24H    HYDROmorphone  (DILAUDID ) injection 0.5 mg  0.5 mg IntraVENous Q3H PRN    oxyCODONE  (ROXICODONE ) immediate release tablet 5 mg  5 mg Oral Q4H PRN    Or    oxyCODONE  (  ROXICODONE ) immediate release tablet 10 mg  10 mg Oral Q4H PRN    naloxone  (NARCAN ) injection 0.4 mg  0.4 mg IntraVENous PRN    sennosides-docusate sodium  (SENOKOT-S) 8.6-50 MG tablet 2 tablet  2 tablet Oral Daily    polyethylene glycol (GLYCOLAX ) packet 17 g  17 g Oral Daily    insulin  glargine (LANTUS ) injection vial 10 Units  10 Units SubCUTAneous Nightly    enoxaparin  (LOVENOX ) injection 40 mg  40 mg SubCUTAneous Daily    sodium chloride  flush 0.9 % injection 5-40 mL  5-40 mL IntraVENous 2 times per day    sodium chloride  flush 0.9 % injection 5-40 mL  5-40 mL IntraVENous PRN    0.9 % sodium chloride  infusion   IntraVENous PRN    magnesium  sulfate 2000 mg in 50 mL IVPB premix  2,000 mg IntraVENous PRN    ondansetron  (ZOFRAN -ODT) disintegrating tablet 4 mg  4 mg Oral Q8H PRN    Or    ondansetron  (ZOFRAN ) injection 4 mg  4 mg IntraVENous Q6H PRN    acetaminophen  (TYLENOL ) tablet 650 mg  650 mg Oral Q6H PRN    Or    acetaminophen  (TYLENOL ) suppository 650 mg  650 mg Rectal Q6H PRN    budesonide  (PULMICORT ) nebulizer suspension 250 mcg  0.25 mg Nebulization BID    pantoprazole  (PROTONIX ) tablet 40 mg  40 mg Oral QAM AC    pregabalin  (LYRICA ) capsule 150 mg  150 mg Oral BID    carvedilol  (COREG ) tablet 12.5 mg  12.5 mg Oral BID    amLODIPine  (NORVASC ) tablet 5 mg  5 mg Oral Daily    glucose chewable tablet 16 g  4 tablet Oral PRN    dextrose  bolus 10% 125 mL  125 mL IntraVENous PRN    Or    dextrose  bolus 10% 250 mL  250 mL IntraVENous PRN    glucagon  injection 1 mg  1 mg SubCUTAneous PRN    dextrose  10 %  infusion   IntraVENous Continuous PRN    insulin  lispro (HUMALOG ,ADMELOG ) injection vial 0-4 Units  0-4 Units SubCUTAneous 4x Daily AC & HS     ______________________________________________________________________  EXPECTED LENGTH OF STAY: 6  ACTUAL LENGTH OF STAY:          6                 Savannah Divine, MD

## 2024-08-24 NOTE — Care Coordination-Inpatient (Signed)
 Care Transitions Note    Initial Call - Call within 2 business days of discharge: Yes    Patient Current Location:  [place]     Care Transition Nurse contacted the patient by telephone to perform post hospital discharge assessment, verified name and DOB as identifiers.  Provided introduction to self, and explanation of the Care Transition Nurse role.    Patient: Jamie Bryant    Patient DOB: [birth year]   MRN: [id number]    Reason for Admission: Cellulitis  Discharge Date: 08/23/24  RURS: Readmission Risk Score: 18.4      Last Discharge Facility       Date Complaint Diagnosis Description Type Department Provider    08/17/24 Fall Cellulitis of right lower extremity ... ED to Hosp-Admission (Discharged) (ADMITTED) [id number] Moody Dama, MD; Jonette Purchase T...            Was this an external facility discharge? No    Additional needs identified to be addressed with provider   -Patient declined SNF placement         Method of communication with provider: chart routing.    Patients top risk factors for readmission: functional physical ability    Interventions to address risk factors:   Patient declines    Care Summary Note: Patient declines CTN follow up. Patient declines sooner appointment with PCP. Patient unclear if she has another PCP at [facility] that she may follow up with instead of Dr. Graylon.     Care Transition Nurse reviewed discharge instructions and medical action plan with patient. The patient was given an opportunity to ask questions; no further questions or concerns at this time.. The patient verbalized understanding.   Were discharge instructions available to patient? Yes.   Reviewed appropriate site of care based on symptoms and resources available to patient including: PCP  Home health  When to call 911. The patient agrees to contact the primary care provider and/or specialist office for questions related to their healthcare.      Advance Care Planning:   Does patient have an Advance Directive:  patient declined education.    Medication Review:  Medication review was performed with patient,1111F entered: yes.     Remote Patient Monitoring:  Offered patient enrollment in the Remote Patient Monitoring (RPM) program for in-home monitoring: Patient is not eligible for RPM program because: declines follow up.    Assessments:  Care Transitions 24 Hour Call    Do you have a copy of your discharge instructions?: Yes  Do you have all of your prescriptions and are they filled?: No  Have you been contacted by a [facility] Pharmacist?: No  Have you scheduled your follow up appointment?: No  Post Acute Services: Home Health  Do you have support at home?: Alone  Do you feel like you have everything you need to keep you well at home?: Yes  Are you an active caregiver in your home?: No  Care Transitions Interventions          Follow Up Appointment:   Discussed follow up appointments. Patient has hospital follow up appointment scheduled within 14 days of discharge.   Future Appointments         Provider Specialty Dept Phone    09/07/2024 12:30 PM Graylon Willma LABOR, MD Internal Medicine [phone]            Patient closed (declined further [facility] services) from the Care Transitions program on 08/24/2024.    Handoff:   Patient was not referred to  the ACM team due to patient declined services.      Patient has agreed to contact primary care provider and/or specialist for any further questions, concerns, or needs.    Corean LOISE Blessing, RN

## 2024-09-07 ENCOUNTER — Ambulatory Visit: Payer: MEDICARE | Attending: Internal Medicine

## 2024-10-20 ENCOUNTER — Encounter

## 2024-10-20 NOTE — Telephone Encounter (Signed)
"  Pa refill  "

## 2024-10-22 MED ORDER — PREGABALIN 300 MG PO CAPS
300 | ORAL_CAPSULE | Freq: Two times a day (BID) | ORAL | 4 refills | 30.00000 days | Status: DC
Start: 2024-10-22 — End: 2024-11-06

## 2024-11-05 ENCOUNTER — Encounter

## 2024-11-05 NOTE — Telephone Encounter (Signed)
 refill

## 2024-11-06 ENCOUNTER — Encounter

## 2024-11-06 MED ORDER — PREGABALIN 300 MG PO CAPS
300 | ORAL_CAPSULE | Freq: Two times a day (BID) | ORAL | 4 refills | 30.00000 days | Status: DC
Start: 2024-11-06 — End: 2024-11-16

## 2024-11-06 NOTE — Telephone Encounter (Signed)
 rill

## 2024-11-06 NOTE — Telephone Encounter (Signed)
 refill

## 2024-11-16 ENCOUNTER — Encounter

## 2024-11-16 NOTE — Telephone Encounter (Signed)
 refill

## 2024-11-17 MED ORDER — PREGABALIN 300 MG PO CAPS
300 | ORAL_CAPSULE | Freq: Two times a day (BID) | ORAL | 4 refills | Status: AC
Start: 2024-11-17 — End: 2025-11-15

## 2024-12-03 NOTE — Telephone Encounter (Signed)
"  Pa submitted   "

## 2024-12-03 NOTE — Telephone Encounter (Signed)
 refill

## 2024-12-21 MED ORDER — SITAGLIPTIN PHOSPHATE 100 MG PO TABS
100 | ORAL_TABLET | Freq: Every day | ORAL | 3 refills | 30.00000 days | Status: AC
Start: 2024-12-21 — End: ?
